# Patient Record
Sex: Male | Born: 1960 | Race: White | Hispanic: No | Marital: Married | Smoking: Never smoker
Health system: Southern US, Community
[De-identification: ages and names within clinical notes are randomized; demographics above are authoritative.]

## PROBLEM LIST (undated history)

## (undated) DIAGNOSIS — I639 Cerebral infarction, unspecified: Secondary | ICD-10-CM

## (undated) HISTORY — DX: Cerebral infarction, unspecified: I63.9

---

## 2004-09-01 ENCOUNTER — Emergency Department (HOSPITAL_COMMUNITY): Admission: EM | Admit: 2004-09-01 | Discharge: 2004-09-01 | Payer: Self-pay | Admitting: Emergency Medicine

## 2004-09-01 IMAGING — CR DG LUMBAR SPINE COMPLETE 4+V
5 series · 5 of 5 positions shown · non-contrast
Comparison: none

CLINICAL DATA: Bilateral lower extremity pain. No injury.  
 LUMBAR SPINE COMPLETE ? [DATE] ([SP] HOURS)

[view not recorded (1 of 5)]
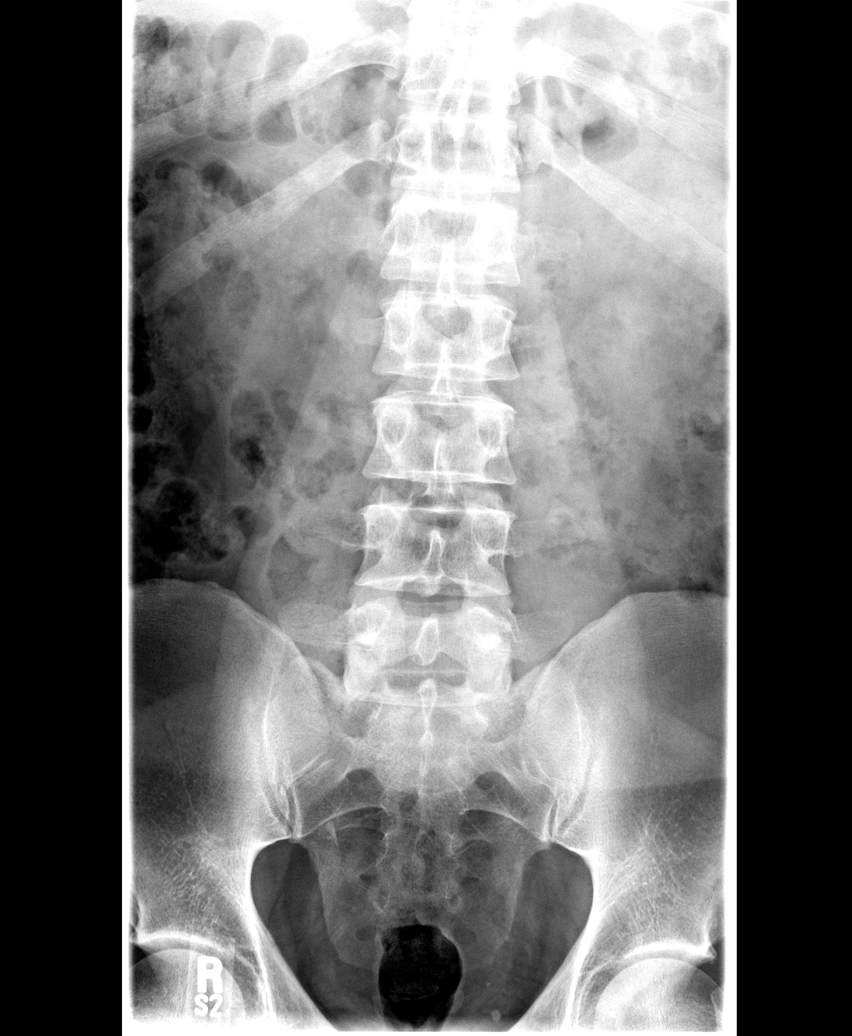

[view not recorded (2 of 5)]
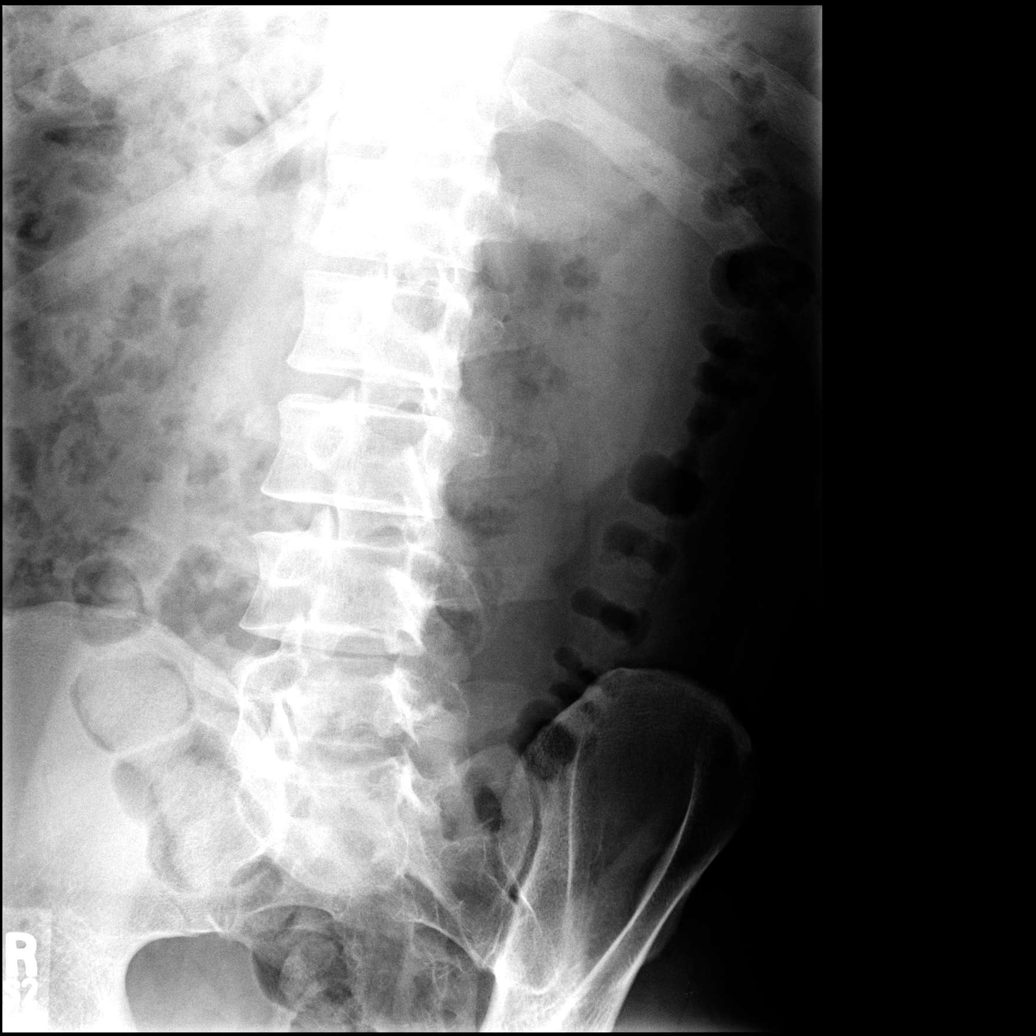

[view not recorded (3 of 5)]
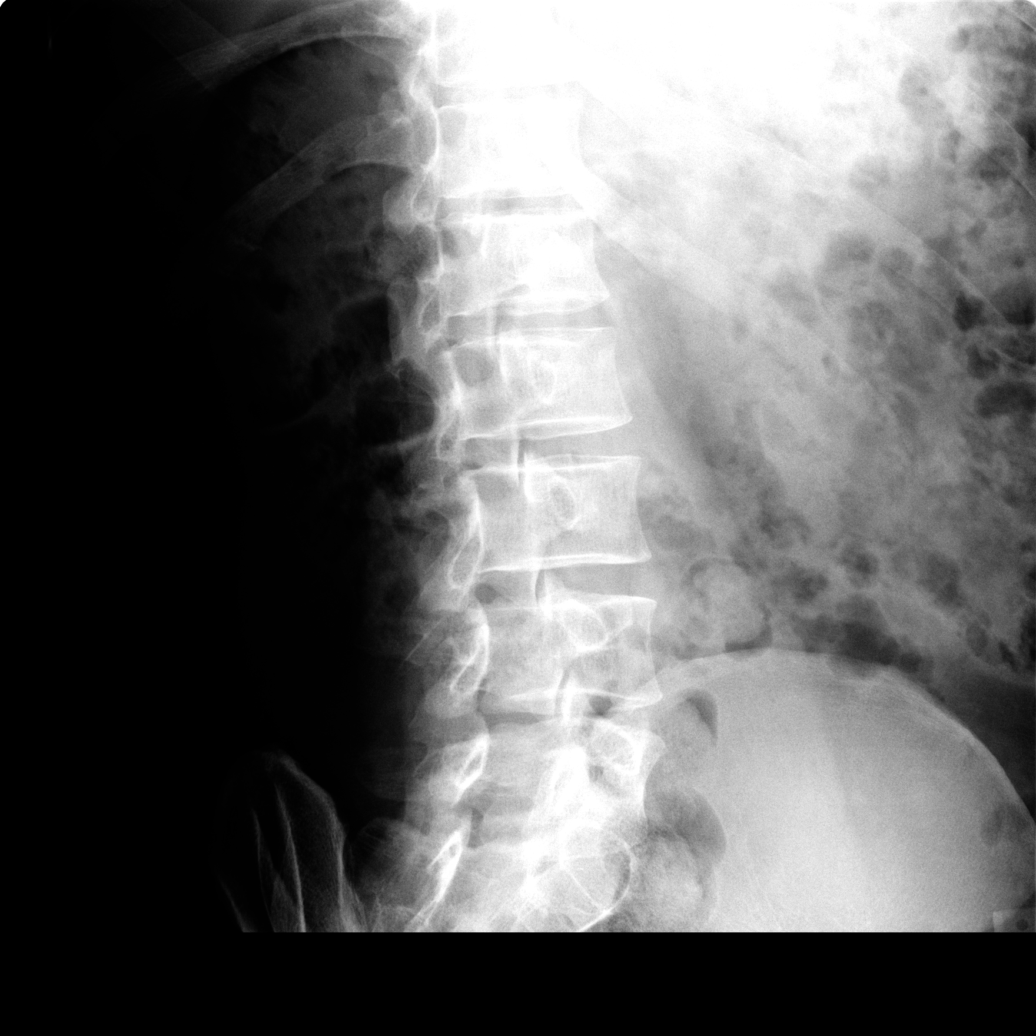

[view not recorded (4 of 5)]
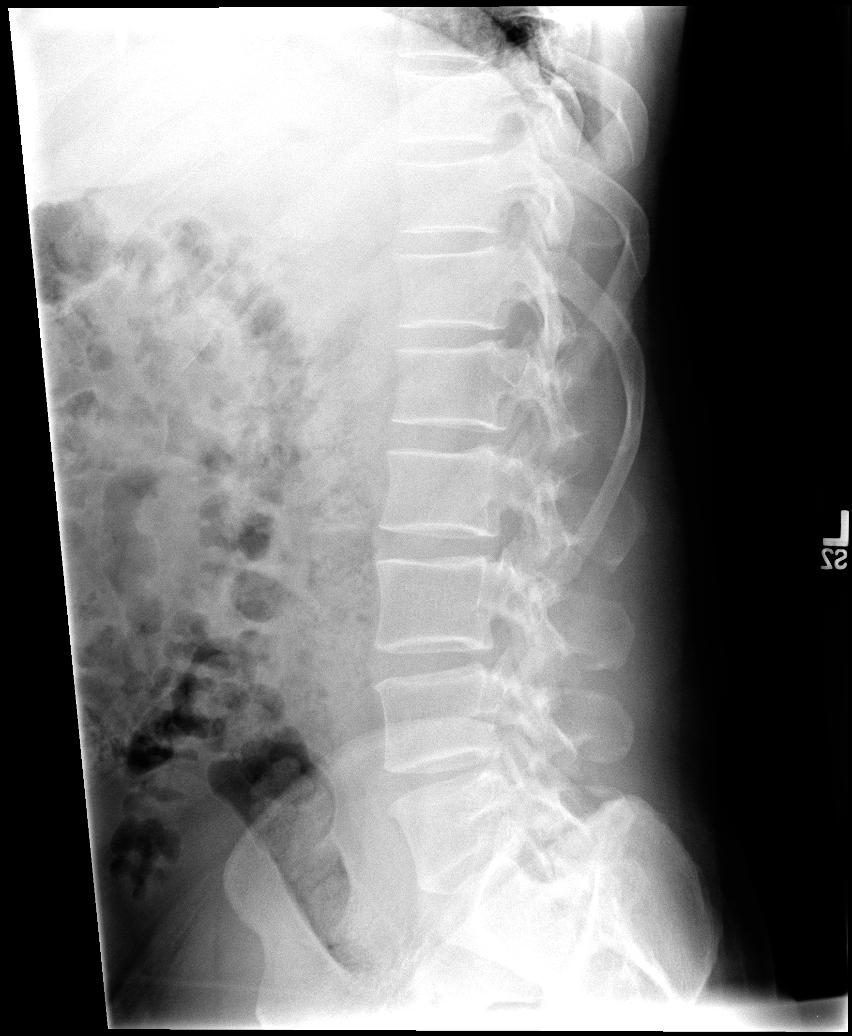

[view not recorded (5 of 5)]
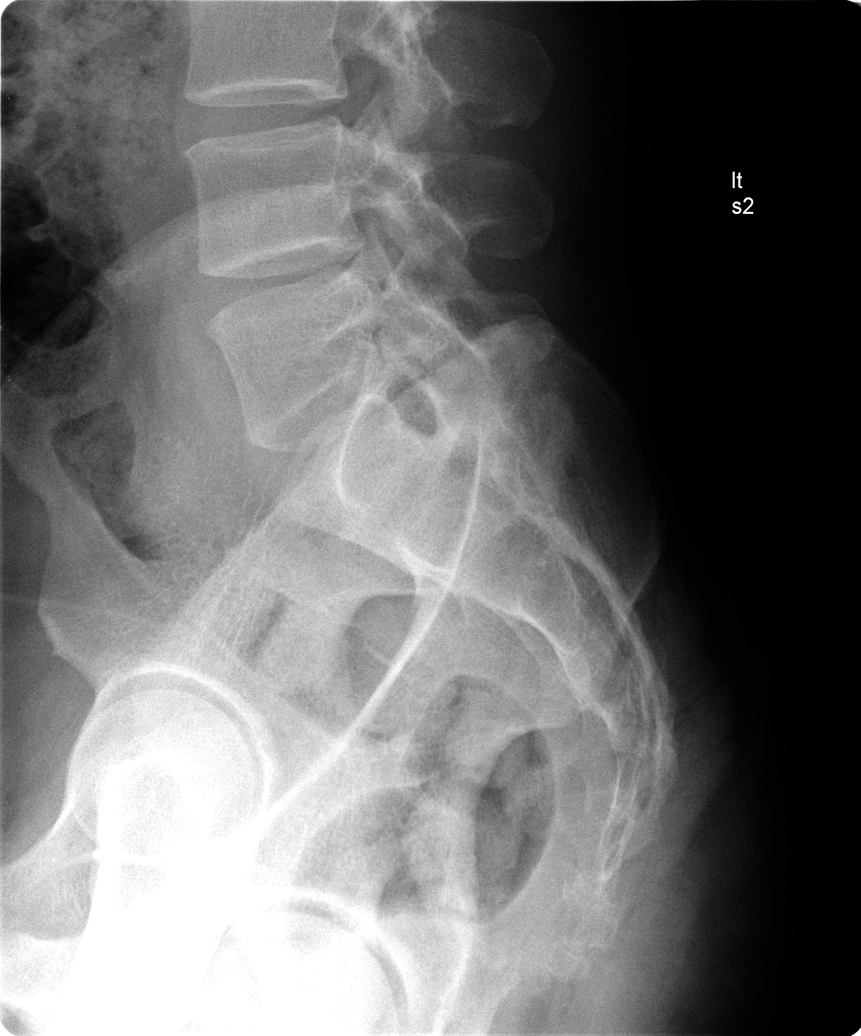

[5 of 5 positions shown; findings below may reference images not displayed]

FINDINGS: There is anatomic alignment of the vertebral bodies.  There is no vertebral body height loss.  Mild narrowing at the L4-5 disc is present.   No pars defects are seen.   No definite fractures are seen.
IMPRESSION: No evidence of acute bony injury.  Mild degenerative change at L4-5.

## 2022-02-11 ENCOUNTER — Inpatient Hospital Stay (HOSPITAL_COMMUNITY): Payer: 59

## 2022-02-11 ENCOUNTER — Inpatient Hospital Stay (HOSPITAL_COMMUNITY): Payer: 59 | Admitting: Anesthesiology

## 2022-02-11 ENCOUNTER — Other Ambulatory Visit: Payer: Self-pay

## 2022-02-11 ENCOUNTER — Emergency Department (HOSPITAL_COMMUNITY): Payer: 59 | Admitting: Certified Registered"

## 2022-02-11 ENCOUNTER — Emergency Department (HOSPITAL_COMMUNITY): Payer: 59

## 2022-02-11 ENCOUNTER — Encounter (HOSPITAL_COMMUNITY)
Admission: EM | Disposition: A | Discharge status: Rehabilitation Facility | Payer: Self-pay | Source: Home / Self Care | Attending: Neurology

## 2022-02-11 ENCOUNTER — Inpatient Hospital Stay (HOSPITAL_COMMUNITY)
Admission: EM | Admit: 2022-02-11 | Discharge: 2022-02-18 | DRG: 023 | Disposition: A | Discharge status: Rehabilitation Facility | Payer: 59 | Attending: Neurology | Admitting: Neurology

## 2022-02-11 ENCOUNTER — Other Ambulatory Visit (HOSPITAL_COMMUNITY): Payer: 59

## 2022-02-11 ENCOUNTER — Encounter (HOSPITAL_COMMUNITY): Payer: Self-pay

## 2022-02-11 DIAGNOSIS — R4701 Aphasia: Secondary | ICD-10-CM | POA: Diagnosis present

## 2022-02-11 DIAGNOSIS — T82856A Stenosis of peripheral vascular stent, initial encounter: Secondary | ICD-10-CM | POA: Diagnosis not present

## 2022-02-11 DIAGNOSIS — N179 Acute kidney failure, unspecified: Secondary | ICD-10-CM | POA: Diagnosis not present

## 2022-02-11 DIAGNOSIS — R0689 Other abnormalities of breathing: Secondary | ICD-10-CM | POA: Diagnosis not present

## 2022-02-11 DIAGNOSIS — I69328 Other speech and language deficits following cerebral infarction: Secondary | ICD-10-CM | POA: Diagnosis not present

## 2022-02-11 DIAGNOSIS — Z6838 Body mass index (BMI) 38.0-38.9, adult: Secondary | ICD-10-CM | POA: Diagnosis not present

## 2022-02-11 DIAGNOSIS — R1312 Dysphagia, oropharyngeal phase: Secondary | ICD-10-CM | POA: Diagnosis not present

## 2022-02-11 DIAGNOSIS — Y828 Other medical devices associated with adverse incidents: Secondary | ICD-10-CM | POA: Diagnosis not present

## 2022-02-11 DIAGNOSIS — I69320 Aphasia following cerebral infarction: Secondary | ICD-10-CM | POA: Diagnosis not present

## 2022-02-11 DIAGNOSIS — I493 Ventricular premature depolarization: Secondary | ICD-10-CM

## 2022-02-11 DIAGNOSIS — G8191 Hemiplegia, unspecified affecting right dominant side: Secondary | ICD-10-CM | POA: Diagnosis present

## 2022-02-11 DIAGNOSIS — I69391 Dysphagia following cerebral infarction: Secondary | ICD-10-CM | POA: Diagnosis not present

## 2022-02-11 DIAGNOSIS — I63312 Cerebral infarction due to thrombosis of left middle cerebral artery: Secondary | ICD-10-CM | POA: Diagnosis present

## 2022-02-11 DIAGNOSIS — Z20822 Contact with and (suspected) exposure to covid-19: Secondary | ICD-10-CM | POA: Diagnosis present

## 2022-02-11 DIAGNOSIS — Y838 Other surgical procedures as the cause of abnormal reaction of the patient, or of later complication, without mention of misadventure at the time of the procedure: Secondary | ICD-10-CM | POA: Diagnosis not present

## 2022-02-11 DIAGNOSIS — E7800 Pure hypercholesterolemia, unspecified: Secondary | ICD-10-CM | POA: Diagnosis not present

## 2022-02-11 DIAGNOSIS — I63512 Cerebral infarction due to unspecified occlusion or stenosis of left middle cerebral artery: Secondary | ICD-10-CM | POA: Diagnosis present

## 2022-02-11 DIAGNOSIS — R29810 Facial weakness: Secondary | ICD-10-CM | POA: Diagnosis present

## 2022-02-11 DIAGNOSIS — E785 Hyperlipidemia, unspecified: Secondary | ICD-10-CM | POA: Diagnosis present

## 2022-02-11 DIAGNOSIS — R29710 NIHSS score 10: Secondary | ICD-10-CM | POA: Diagnosis not present

## 2022-02-11 DIAGNOSIS — I82401 Acute embolism and thrombosis of unspecified deep veins of right lower extremity: Secondary | ICD-10-CM | POA: Diagnosis not present

## 2022-02-11 DIAGNOSIS — E6609 Other obesity due to excess calories: Secondary | ICD-10-CM | POA: Diagnosis not present

## 2022-02-11 DIAGNOSIS — R29708 NIHSS score 8: Secondary | ICD-10-CM | POA: Diagnosis present

## 2022-02-11 DIAGNOSIS — I10 Essential (primary) hypertension: Secondary | ICD-10-CM | POA: Diagnosis present

## 2022-02-11 DIAGNOSIS — R1311 Dysphagia, oral phase: Secondary | ICD-10-CM | POA: Diagnosis not present

## 2022-02-11 DIAGNOSIS — I82451 Acute embolism and thrombosis of right peroneal vein: Secondary | ICD-10-CM | POA: Diagnosis not present

## 2022-02-11 DIAGNOSIS — I824Y9 Acute embolism and thrombosis of unspecified deep veins of unspecified proximal lower extremity: Secondary | ICD-10-CM | POA: Diagnosis not present

## 2022-02-11 DIAGNOSIS — I69351 Hemiplegia and hemiparesis following cerebral infarction affecting right dominant side: Secondary | ICD-10-CM | POA: Diagnosis not present

## 2022-02-11 DIAGNOSIS — E669 Obesity, unspecified: Secondary | ICD-10-CM | POA: Diagnosis present

## 2022-02-11 DIAGNOSIS — I6012 Nontraumatic subarachnoid hemorrhage from left middle cerebral artery: Secondary | ICD-10-CM | POA: Diagnosis not present

## 2022-02-11 DIAGNOSIS — T80818A Extravasation of other vesicant agent, initial encounter: Secondary | ICD-10-CM | POA: Diagnosis not present

## 2022-02-11 DIAGNOSIS — K219 Gastro-esophageal reflux disease without esophagitis: Secondary | ICD-10-CM | POA: Diagnosis present

## 2022-02-11 DIAGNOSIS — Z6835 Body mass index (BMI) 35.0-35.9, adult: Secondary | ICD-10-CM | POA: Diagnosis not present

## 2022-02-11 DIAGNOSIS — I639 Cerebral infarction, unspecified: Secondary | ICD-10-CM | POA: Diagnosis not present

## 2022-02-11 DIAGNOSIS — I69392 Facial weakness following cerebral infarction: Secondary | ICD-10-CM | POA: Diagnosis not present

## 2022-02-11 DIAGNOSIS — R7303 Prediabetes: Secondary | ICD-10-CM | POA: Diagnosis not present

## 2022-02-11 DIAGNOSIS — R471 Dysarthria and anarthria: Secondary | ICD-10-CM | POA: Diagnosis present

## 2022-02-11 DIAGNOSIS — I82461 Acute embolism and thrombosis of right calf muscular vein: Secondary | ICD-10-CM | POA: Diagnosis not present

## 2022-02-11 DIAGNOSIS — N390 Urinary tract infection, site not specified: Secondary | ICD-10-CM | POA: Diagnosis not present

## 2022-02-11 DIAGNOSIS — E871 Hypo-osmolality and hyponatremia: Secondary | ICD-10-CM | POA: Diagnosis not present

## 2022-02-11 HISTORY — PX: IR US GUIDE VASC ACCESS RIGHT: IMG2390

## 2022-02-11 HISTORY — PX: IR PERCUTANEOUS ART THROMBECTOMY/INFUSION INTRACRANIAL INC DIAG ANGIO: IMG6087

## 2022-02-11 HISTORY — PX: RADIOLOGY WITH ANESTHESIA: SHX6223

## 2022-02-11 HISTORY — PX: IR CT HEAD LTD: IMG2386

## 2022-02-11 LAB — CBC
HCT: 48.8 % (ref 39.0–52.0)
Hemoglobin: 16.4 g/dL (ref 13.0–17.0)
MCH: 30.8 pg (ref 26.0–34.0)
MCHC: 33.6 g/dL (ref 30.0–36.0)
MCV: 91.6 fL (ref 80.0–100.0)
Platelets: 181 10*3/uL (ref 150–400)
RBC: 5.33 MIL/uL (ref 4.22–5.81)
RDW: 12.4 % (ref 11.5–15.5)
WBC: 7.8 10*3/uL (ref 4.0–10.5)
nRBC: 0 % (ref 0.0–0.2)

## 2022-02-11 LAB — DIFFERENTIAL
Abs Immature Granulocytes: 0.03 10*3/uL (ref 0.00–0.07)
Basophils Absolute: 0.1 10*3/uL (ref 0.0–0.1)
Basophils Relative: 1 %
Eosinophils Absolute: 0.1 10*3/uL (ref 0.0–0.5)
Eosinophils Relative: 1 %
Immature Granulocytes: 0 %
Lymphocytes Relative: 51 %
Lymphs Abs: 3.9 10*3/uL (ref 0.7–4.0)
Monocytes Absolute: 1 10*3/uL (ref 0.1–1.0)
Monocytes Relative: 12 %
Neutro Abs: 2.8 10*3/uL (ref 1.7–7.7)
Neutrophils Relative %: 35 %

## 2022-02-11 LAB — COMPREHENSIVE METABOLIC PANEL
ALT: 27 U/L (ref 0–44)
AST: 23 U/L (ref 15–41)
Albumin: 4.1 g/dL (ref 3.5–5.0)
Alkaline Phosphatase: 68 U/L (ref 38–126)
Anion gap: 8 (ref 5–15)
BUN: 20 mg/dL (ref 6–20)
CO2: 21 mmol/L — ABNORMAL LOW (ref 22–32)
Calcium: 8.8 mg/dL — ABNORMAL LOW (ref 8.9–10.3)
Chloride: 107 mmol/L (ref 98–111)
Creatinine, Ser: 1.2 mg/dL (ref 0.61–1.24)
GFR, Estimated: >60 mL/min (ref >60)
Glucose, Bld: 161 mg/dL — ABNORMAL HIGH (ref 70–99)
Potassium: 3.8 mmol/L (ref 3.5–5.1)
Sodium: 136 mmol/L (ref 135–145)
Total Bilirubin: 0.7 mg/dL (ref 0.3–1.2)
Total Protein: 7.2 g/dL (ref 6.5–8.1)

## 2022-02-11 LAB — RAPID URINE DRUG SCREEN, HOSP PERFORMED
Amphetamines: NOT DETECTED
Barbiturates: NOT DETECTED
Benzodiazepines: NOT DETECTED
Cocaine: NOT DETECTED
Opiates: NOT DETECTED
Tetrahydrocannabinol: NOT DETECTED

## 2022-02-11 LAB — ETHANOL: Alcohol, Ethyl (B): <10 mg/dL (ref <10)

## 2022-02-11 LAB — POCT I-STAT 7, (LYTES, BLD GAS, ICA,H+H)
Acid-base deficit: 4 mmol/L — ABNORMAL HIGH (ref 0.0–2.0)
Bicarbonate: 21.1 mmol/L (ref 20.0–28.0)
Calcium, Ion: 1.11 mmol/L — ABNORMAL LOW (ref 1.15–1.40)
HCT: 42 % (ref 39.0–52.0)
Hemoglobin: 14.3 g/dL (ref 13.0–17.0)
O2 Saturation: 96 %
Patient temperature: 98
Potassium: 4.7 mmol/L (ref 3.5–5.1)
Sodium: 135 mmol/L (ref 135–145)
TCO2: 22 mmol/L (ref 22–32)
pCO2 arterial: 37.8 mmHg (ref 32–48)
pH, Arterial: 7.354 (ref 7.35–7.45)
pO2, Arterial: 85 mmHg (ref 83–108)

## 2022-02-11 LAB — URINALYSIS, ROUTINE W REFLEX MICROSCOPIC
Bilirubin Urine: NEGATIVE
Glucose, UA: NEGATIVE mg/dL
Hgb urine dipstick: NEGATIVE
Ketones, ur: 5 mg/dL — AB
Leukocytes,Ua: NEGATIVE
Nitrite: NEGATIVE
Protein, ur: NEGATIVE mg/dL
Specific Gravity, Urine: 1.033 — ABNORMAL HIGH (ref 1.005–1.030)
pH: 5 (ref 5.0–8.0)

## 2022-02-11 LAB — HEMOGLOBIN A1C
Hgb A1c MFr Bld: 6 % — ABNORMAL HIGH (ref 4.8–5.6)
Mean Plasma Glucose: 125.5 mg/dL

## 2022-02-11 LAB — MRSA NEXT GEN BY PCR, NASAL: MRSA by PCR Next Gen: NOT DETECTED

## 2022-02-11 LAB — PROTIME-INR
INR: 1 (ref 0.8–1.2)
Prothrombin Time: 12.5 seconds (ref 11.4–15.2)

## 2022-02-11 LAB — RESP PANEL BY RT-PCR (FLU A&B, COVID) ARPGX2
Influenza A by PCR: NEGATIVE
Influenza B by PCR: NEGATIVE
SARS Coronavirus 2 by RT PCR: NEGATIVE

## 2022-02-11 LAB — APTT: aPTT: 24 seconds (ref 24–36)

## 2022-02-11 LAB — CBG MONITORING, ED: Glucose-Capillary: 172 mg/dL — ABNORMAL HIGH (ref 70–99)

## 2022-02-11 LAB — HIV ANTIBODY (ROUTINE TESTING W REFLEX): HIV Screen 4th Generation wRfx: NONREACTIVE

## 2022-02-11 IMAGING — XA IR PERCUTANEOUS ART THORMBECTOMY/INFUSION INTRACRANIAL INCLUDE D
7 of 8 series · 12 of 24 positions shown · IV contrast (IODINE)
Comparison: CT/CT angiogram of the head and neck [DATE].

INDICATION: 60-year-old male with sudden onset right sided weakness, right-sided
facial droop and slurred speech. [facility] was [facility] evaluated [facility] admission NIHSS was 8; baseline modified [facility] 0.
Head CT showed small left frontal hypodensity (ASPECTS 9). CT
angiogram of the head and neck showed an occlusion of the left
M1/MCA. He received [facility] at [DATE] a.m. on [DATE] and was then
transferred to [HOSPITAL] for endovascular treatment. At admission
to [HOSPITAL], NIHSS was 10.

EXAM:
ULTRASOUND-GUIDED VASCULAR [REDACTED] CEREBRAL ANGIOGRAM
MECHANICAL THROMBECTOMY
FLAT PANEL HEAD CT
TECHNIQUE: Informed written consent was obtained from the patient's wife after
a thorough discussion of the procedural risks, benefits and
alternatives. All questions were addressed.

[Series 1: cerebral care 2 · 2 acquisitions, 1 frame shown (1 of 5)]
[im 1/2]
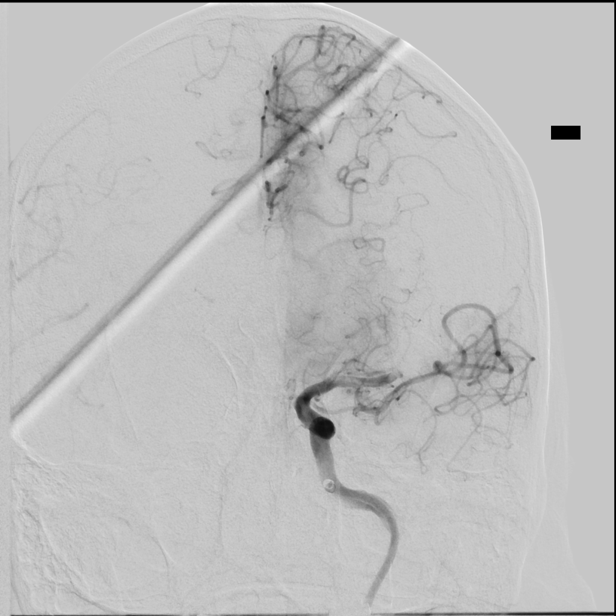

[Series 3: n roadmap · 2 acquisitions, 1 frame shown]
[im 1/2]
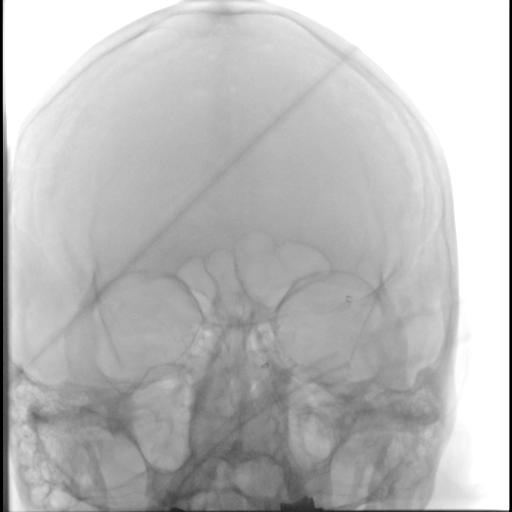

[Series 4: cerebral care 2 · 2 acquisitions, 1 frame shown (2 of 5)]
[im 1/2]
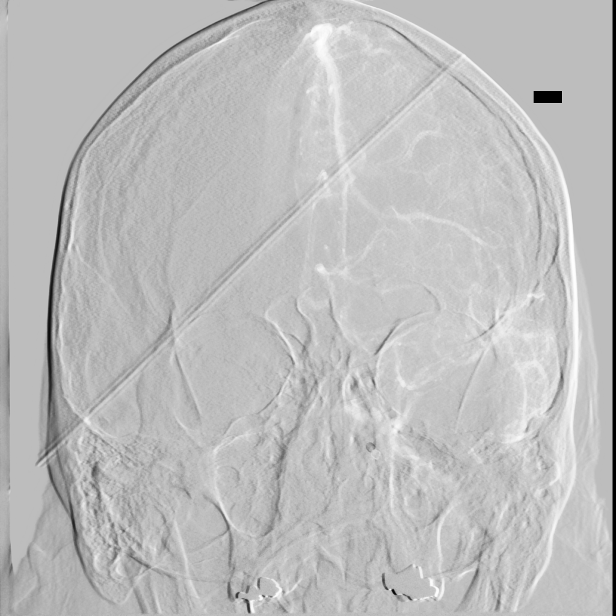

[Series 6: cerebral care 2 · 2 acquisitions, 1 frame shown (3 of 5)]
[im 1/2]
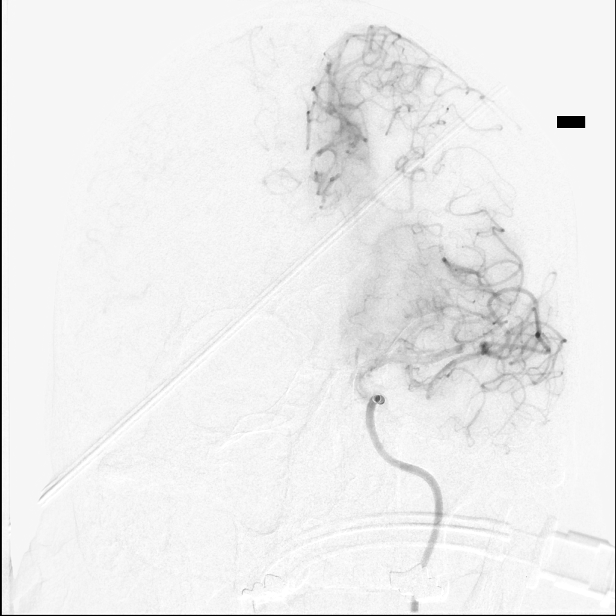

[Series 8: cerebral care 2 · 2 acquisitions, 2 frames shown (4 of 5)]
[im 1/2]
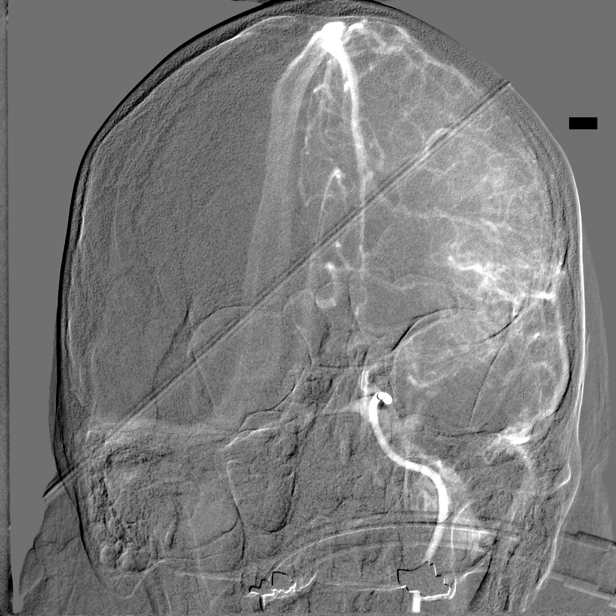
[im 1/2  full-range]
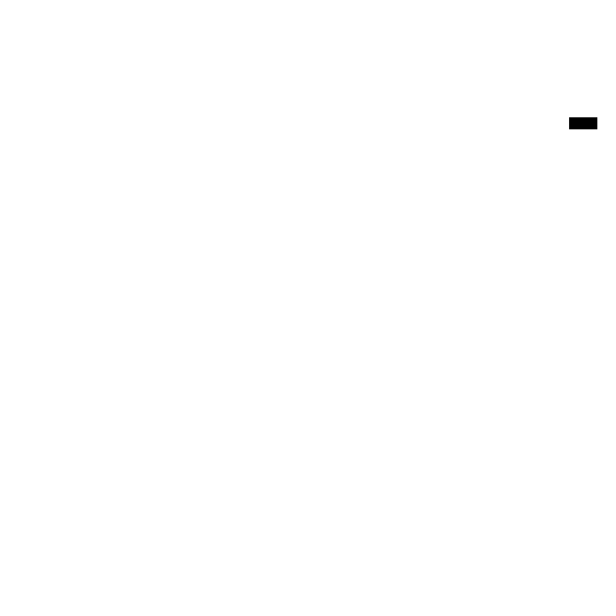

[Series 11: cerebral care 2 · 1 of 10 frames shown (5 of 5)]
[frame 6/10]
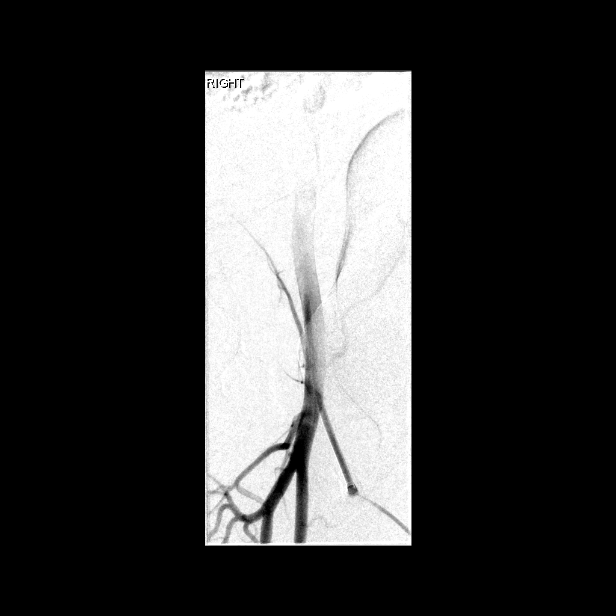

[Series 300: dr. (person_name). · 5 of 26 slices shown]
[im 4/26]
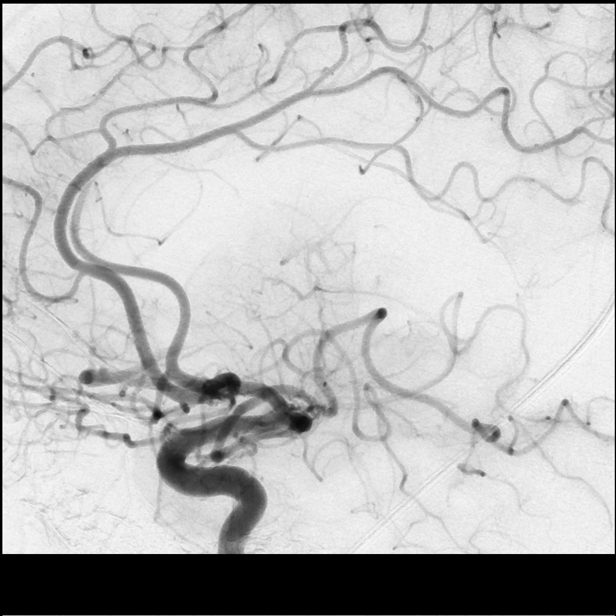
[im 9/26]
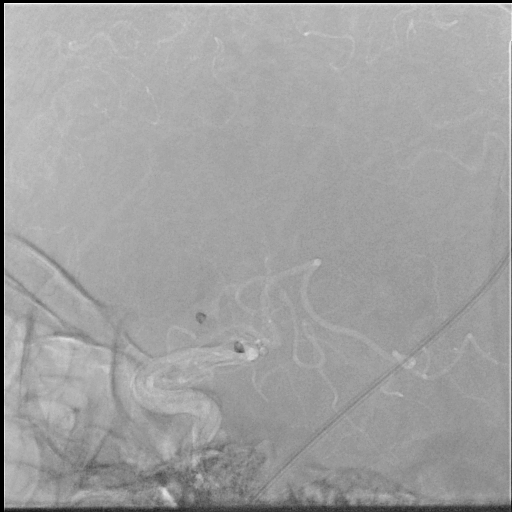
[im 14/26]
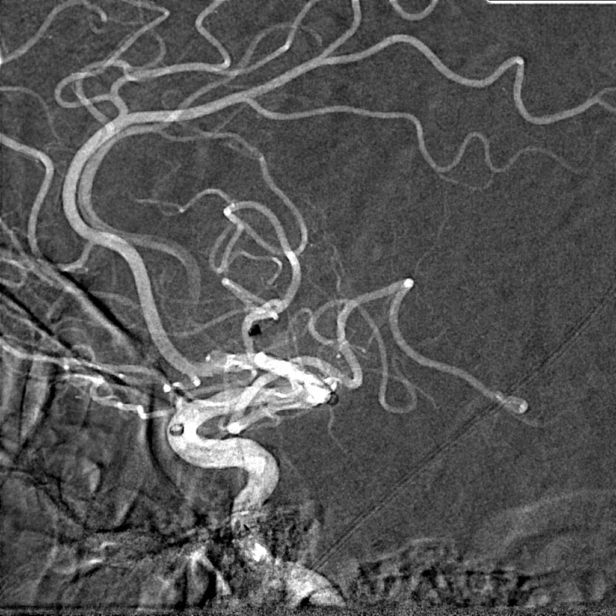
[im 21/26]
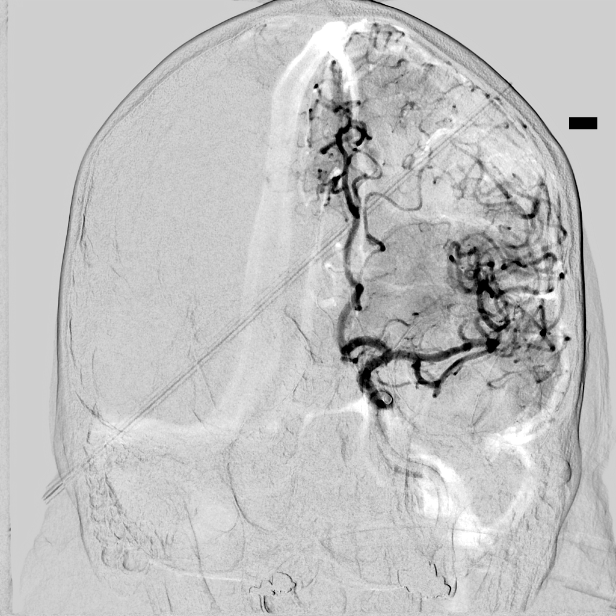
[im 26/26]
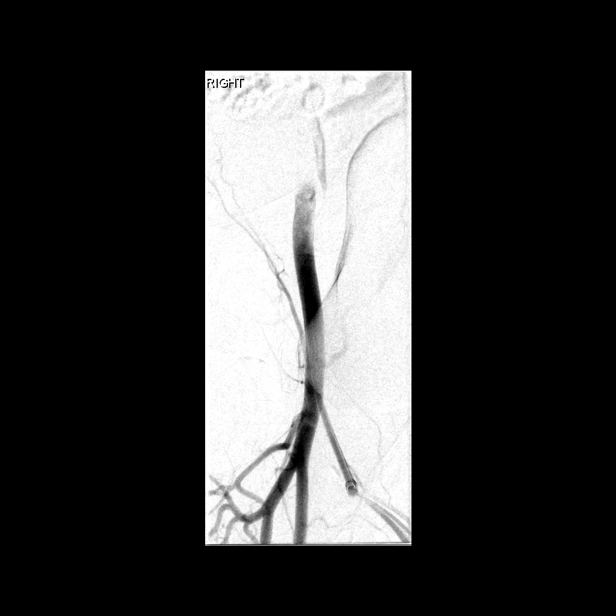

[12 of 24 positions shown; findings below may reference images not displayed]

MEDICATIONS:
No antibiotics administered.

ANESTHESIA/SEDATION:
The procedure was performed under general anesthesia.

CONTRAST:  75 mL Omnipaque 300 milligram/mL

FLUOROSCOPY:
Radiation Exposure Index (as provided by the fluoroscopic device):
PSD 444 mGy Kerma

COMPLICATIONS:
None immediate.
Maximal Sterile Barrier Technique was utilized including caps, mask,
sterile gowns, sterile gloves, sterile drape, hand hygiene and skin
antiseptic. A timeout was performed prior to the initiation of the
procedure.

The right groin was prepped and draped in the usual sterile fashion.
Using a micropuncture kit and the modified Seldinger technique,
access was gained to the right common femoral artery and an 8 French
sheath was placed. Real-time ultrasound guidance was utilized for
vascular access including the acquisition of a permanent ultrasound
image documenting patency of the accessed vessel.

Under fluoroscopy, a Zoom 88 guide catheter was navigated over a 6
[facility] 2 catheter and a 0.035" Terumo Glidewire into the
aortic arch. The catheter was placed into the left common carotid
artery and then advanced into the left internal carotid artery. The
diagnostic catheter was removed. Frontal and lateral angiograms of
the head were obtained.
FINDINGS: 1. The right common femoral artery has normal caliber, adequate for
vascular access.
2. Occlusion of the distal left M1/MCA.
3. Patent left ACA vascular tree.

PROCEDURE:
Using biplane roadmap, a zoom 71 aspiration catheter was navigated
over an Aristotle 24 microguidewire into the cavernous segment of
the left ICA. The aspiration catheter was then advanced to the level
of occlusion in the M1 segment and connected to an aspiration pump.
Continuous aspiration was performed for 2 minutes. The guide
catheter was connected to a VacLok syringe. The aspiration catheter
was subsequently removed under constant aspiration. The guide
catheter was aspirated for debris.

Left internal carotid artery angiograms with magnified frontal and
lateral views of the head showed recanalization of the M1 segment
with patent superior division branch. Occlusion of the posterior
division branches noted.

Using biplane roadmap, a zoom 71 aspiration catheter was navigated
over an Aristotle 24 microguidewire into the cavernous segment of
the left ICA. The aspiration catheter was then advanced to the level
of occlusion in the M2 segment and connected to an aspiration pump.
Continuous aspiration was performed for 2 minutes. The guide
catheter was connected to a VacLok syringe. The aspiration catheter
was subsequently removed under constant aspiration. The guide
catheter was aspirated for debris.

Left internal carotid artery angiograms with magnified frontal and
lateral views of the head showed persistent occlusion of the left M2
posterior division branch.

Using biplane roadmap, a Zoom 55 aspiration catheter was navigated
over an Aristotle 24 microguidewire into the cavernous segment of
the left ICA. The aspiration catheter was then advanced to the level
of occlusion in the M2 posterior division branch and connected to an
aspiration pump. Continuous aspiration was performed for 2 minutes.
The guide catheter was connected to a VacLok syringe. The aspiration
catheter was subsequently removed under constant aspiration. The
guide catheter was aspirated for debris.

Left internal carotid artery angiograms with frontal and lateral
views of the head showed complete recanalization of the left MCA
vascular tree. Mild irregularity of the M2/MCA posterior division
branch likely related to mild vasospasm.

Flat panel CT of the head was obtained and post processed in a
separate workstation with concurrent attending physician
supervision. Selected images were sent to PACS. Hyperdensity of the
cortices in the left hemisphere as well as right basal ganglia most
likely represent contrast staining.

The catheter was subsequently withdrawn.

Right common femoral artery angiogram was obtained in right anterior
oblique view. The puncture is at the level of the common femoral
artery. The artery has normal caliber, adequate for closure device.
The sheath was exchanged over the wire for a Perclose prostyle which
was utilized for access closure. Immediate hemostasis was achieved.
IMPRESSION: 1. Successful mechanical thrombectomy for treatment of a distal left
M1/MCA occlusion with direct contact aspiration ([QJ]).
2. Left-sided cortical and basal ganglia contrast staining on
postprocedural flat panel head CT.

PLAN:
1. Transferred to ICU.
2. SBP 120-140 mm Hg for 24 hours.
3. Bed rest x6 hours.

## 2022-02-11 IMAGING — CT CT ANGIO HEAD-NECK (W OR W/O PERF)
2 of 8 series · 8 of 33 positions shown · non-contrast
Comparison: Plain head CT [GT] hours.

CLINICAL DATA: Code stroke. 60-year-old male with sudden onset
right side weakness.

EXAM:
CT ANGIOGRAPHY HEAD AND NECK
TECHNIQUE: Multidetector CT imaging of the head and neck was performed using
the standard protocol during bolus administration of intravenous
contrast. Multiplanar CT image reconstructions and MIPs were
obtained to evaluate the vascular anatomy. Carotid stenosis
measurements (when applicable) are obtained utilizing NASCET
criteria, using the distal internal carotid diameter as the
denominator.

[Series 5: cta head & neck · axial · 0.39mm/px · z∈[-168,+94]mm · 6 of 741 slices shown]
[im 106/741  soft-tissue]
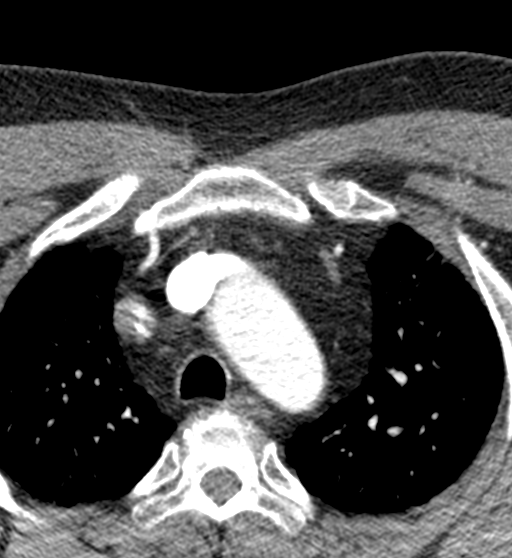
[im 212/741  bone]
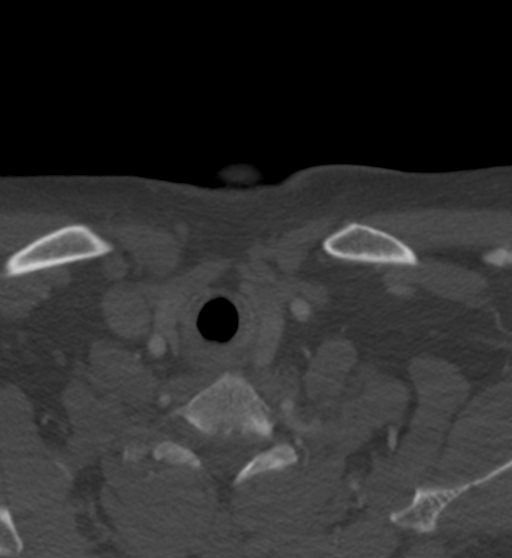
[im 318/741  soft-tissue]
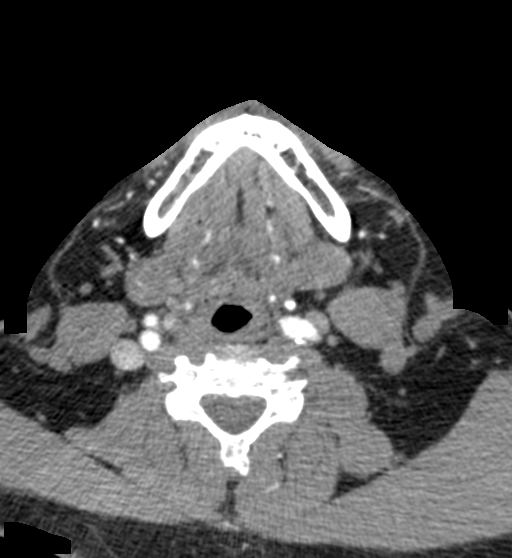
[im 423/741  bone]
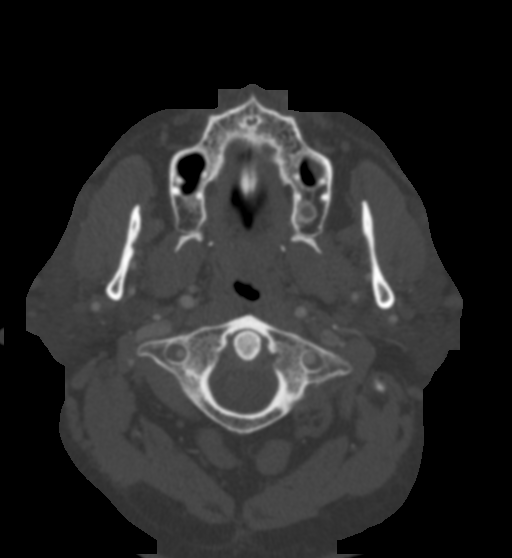
[im 529/741  soft-tissue]
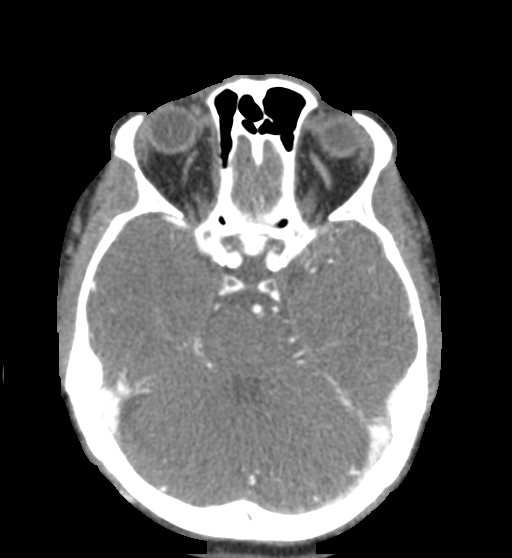
[im 635/741  bone]
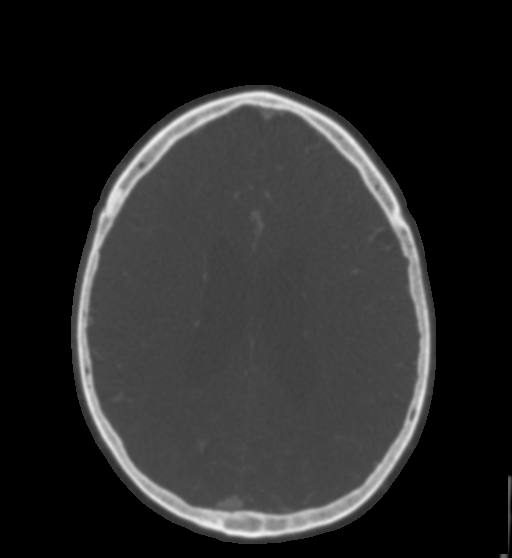

[Series 6: ax thins · axial · 0.39mm/px · z∈[-98,+24]mm · 2 of 371 slices shown]
[im 124/371  soft-tissue]
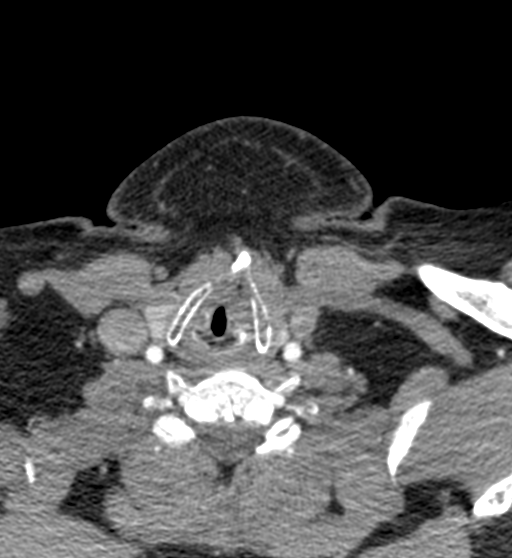
[im 247/371  soft-tissue]
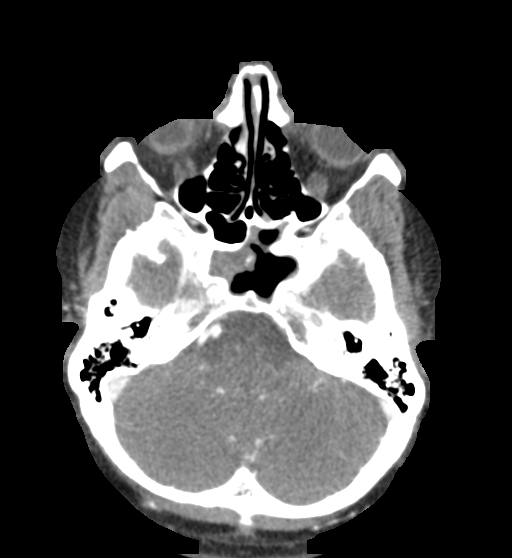

[8 of 33 positions shown; findings below may reference images not displayed]

RADIATION DOSE REDUCTION: This exam was performed according to the
departmental dose-optimization program which includes automated
exposure control, adjustment of the mA and/or kV according to
patient size and/or use of iterative reconstruction technique.

CONTRAST:  75mL OMNIPAQUE IOHEXOL 350 MG/ML SOLN
FINDINGS: CTA NECK

Skeleton: No acute osseous abnormality identified. Ordinary cervical
spine degeneration.

Upper chest: Atelectasis and mediastinal lipomatosis.

Other neck: Small volume retained secretions in the pharynx.
Otherwise negative.

Aortic arch: 3 vessel arch configuration. Minimal arch
atherosclerosis.

Right carotid system: Tortuous brachiocephalic artery without plaque
or stenosis. Minor soft and calcified plaque at the right carotid
bifurcation without stenosis.

Left carotid system: Mild tortuosity and carotid bifurcation plaque
with no stenosis.

Vertebral arteries:
Negative.  Mildly dominant left vertebral artery.

CTA HEAD

Posterior circulation: Minimal right V4 calcified plaque without
stenosis. Dominant left V4 segment. Normal PICA origins and
vertebrobasilar junction. Patent basilar artery without stenosis.
Patent SCA and PCA origins. Posterior communicating arteries are
diminutive or absent. PCA branches are within normal limits.

Anterior circulation: Both ICA siphons are patent. Mild to moderate
calcified plaque primarily in the supraclinoid segments. Mild
supraclinoid stenosis results greater on the left. Patent carotid
termini, MCA and ACA origins. Anterior communicating artery and ACA
branches are within normal limits. Right MCA M1 segment and
bifurcation are patent without stenosis. Right MCA branches are
within normal limits.

Left MCA origin is patent but the left M1 is occluded about 12 mm
distal to the origin (series 7, image 97). The left anterior
temporal artery origin is patent proximal to the occlusion and there
is good reconstitution of left MCA branches

Venous sinuses: Patent.

Anatomic variants: Dominant left vertebral artery.

Review of the MIP images confirms the above findings
IMPRESSION: 1. Positive for Emergent Large Vessel Occlusion: Left MCA M1.
This was discussed by telephone with Dr. TIGER on [DATE] at
[GT] hours.
Anterior temporal artery remains patent and there is robust MCA
branch collateral enhancement.

2. Generally mild for age atherosclerosis, most pronounced in the
supraclinoid ICA siphons with calcified plaque. No other significant
arterial stenosis.

## 2022-02-11 IMAGING — CT CT HEAD W/O CM
3 series · 14 of 47 positions shown, 16 images · non-contrast
Comparison: Noncontrast CT head and intraoperative CT head obtained
earlier the same day.

CLINICAL DATA: Stroke status [facility]



[Series 2: head 5.0 h30s · axial · 0.47mm/px · z∈[-113,+32]mm · 8 of 35 slices shown, 10 images]
[im 3/35  brain]
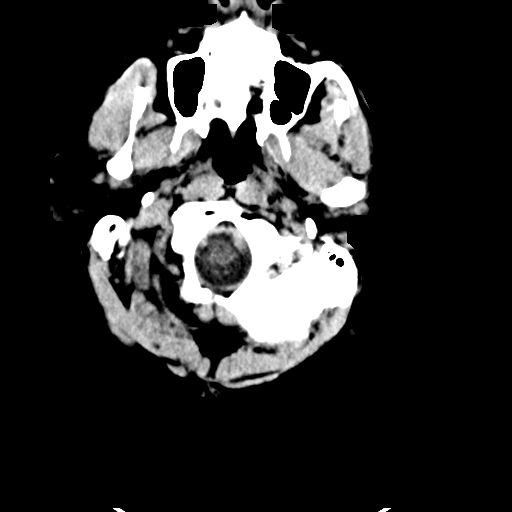
[im 3/35  bone]
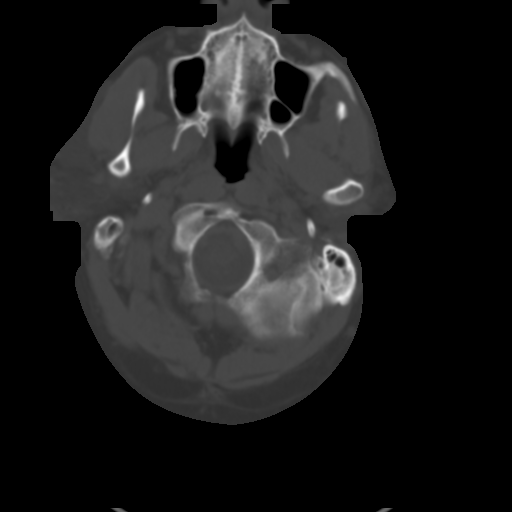
[im 8/35  brain]
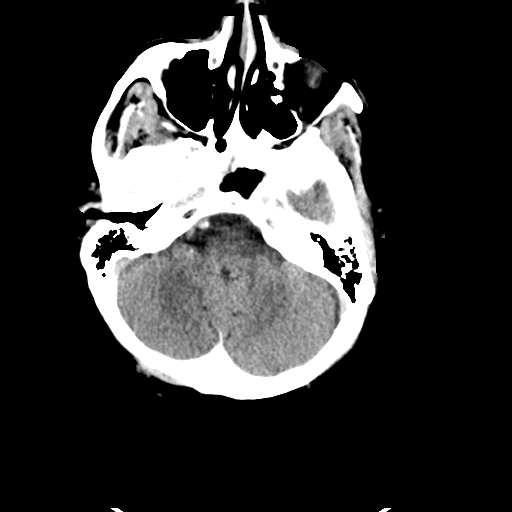
[im 11/35  brain]
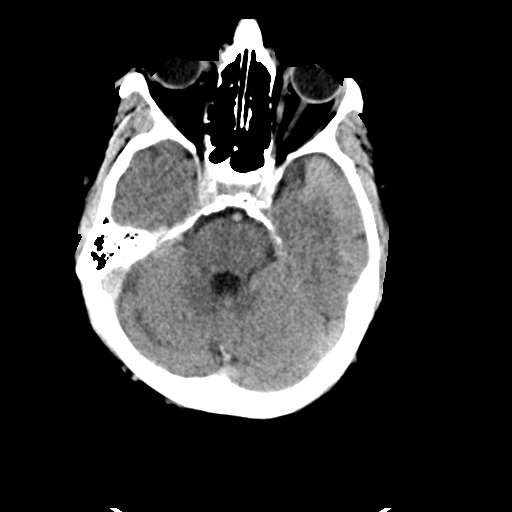
[im 16/35  brain]
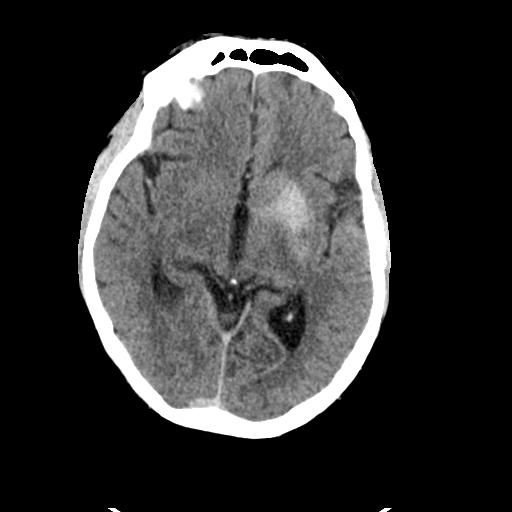
[im 19/35  brain]
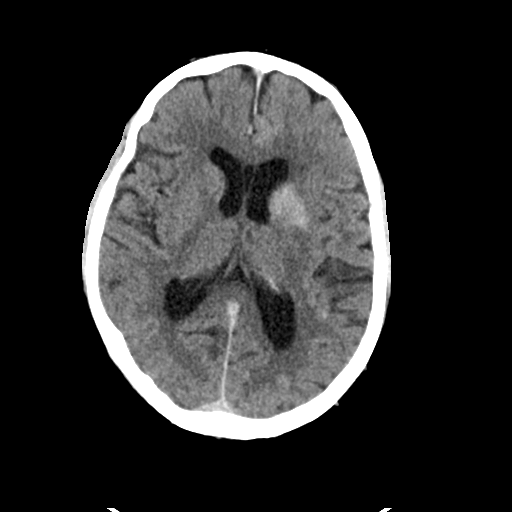
[im 19/35  bone]
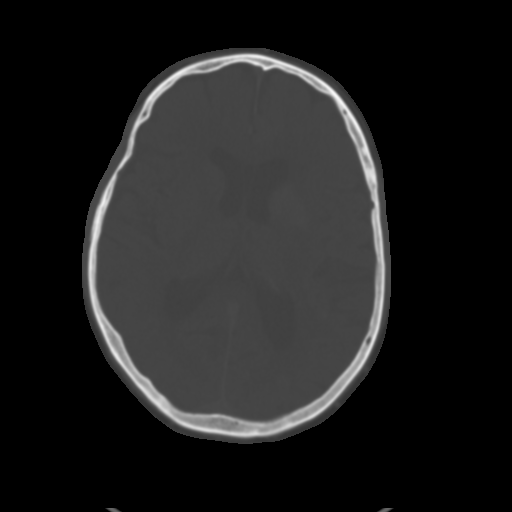
[im 24/35  brain]
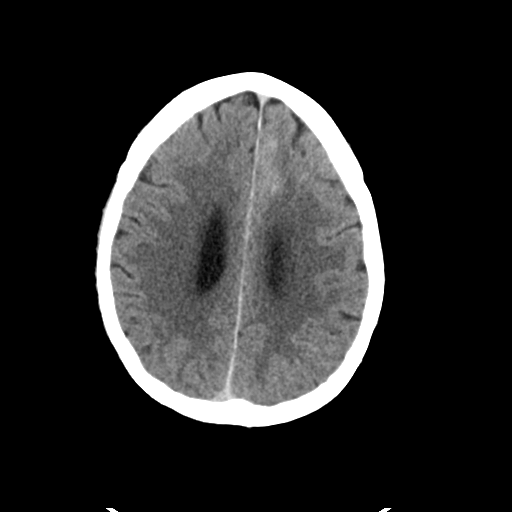
[im 27/35  brain]
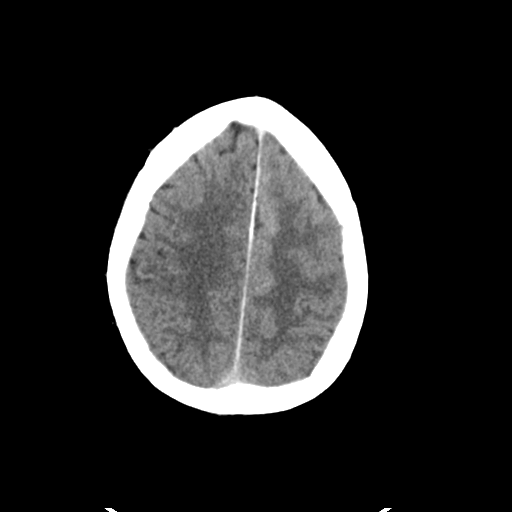
[im 32/35  brain]
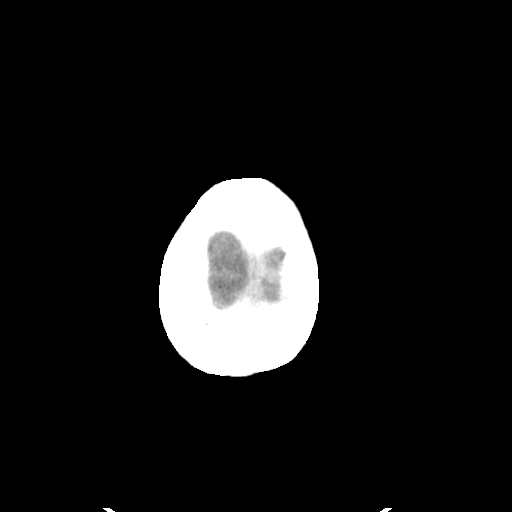

[Series 4: head 3.0 mpr cor · coronal · 0.37mm/px · 3 of 76 slices shown]
[im 26/76  brain]
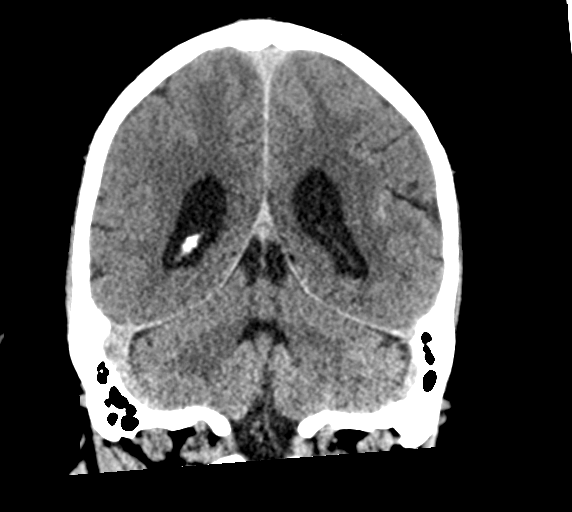
[im 34/76  brain]
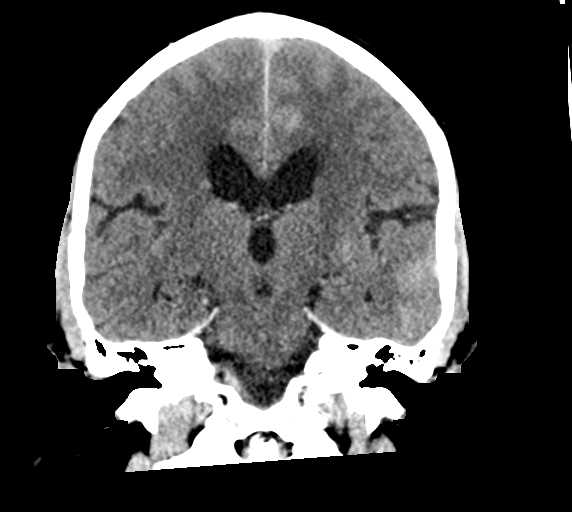
[im 42/76  brain]
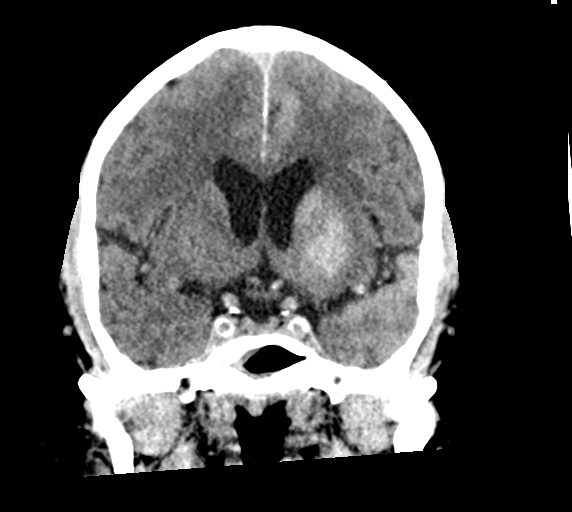

[Series 5: head 3.0 mpr sag · sagittal · 0.41mm/px · 3 of 66 slices shown]
[im 23/66  brain]
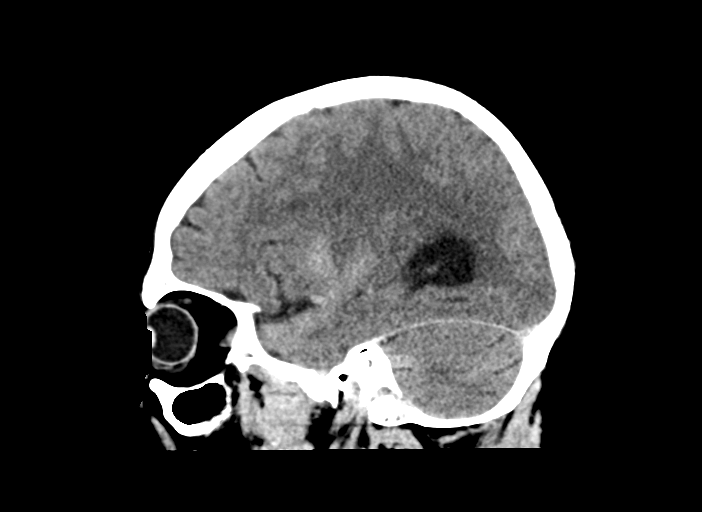
[im 33/66  brain]
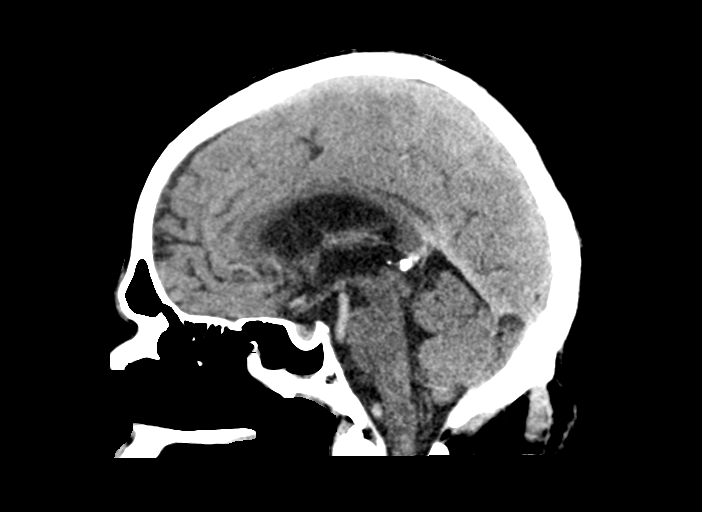
[im 42/66  brain]
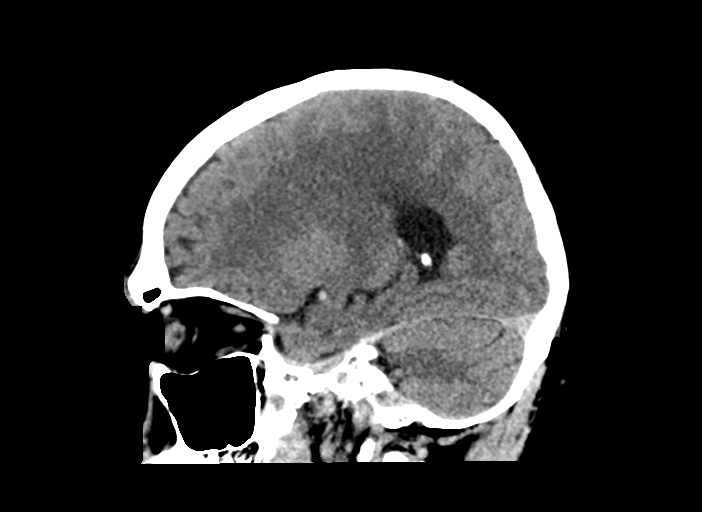

[14 of 47 positions shown; findings below may reference images not displayed]

FINDINGS: Brain: There is hyperdensity in the left caudate head, lentiform
nucleus, and frontal and temporal lobe cortex favored to reflect
contrast staining, though a component of blood products can not be
excluded. The appearance is similar to the intraoperative CT scan.
There is probable small volume subarachnoid hemorrhage overlying the
left cerebral hemisphere (5-48).

Gray-white differentiation in the left MCA distribution appears
preserved, without definite evidence of evolved MCA infarct.

The ventricles are stable in size. There is no mass lesion. There is
no mass effect or midline shift.

Vascular: There is calcified atherosclerotic plaque in the
intracranial ICAs. Previously seen dense left MCA is no longer
apparent.

Skull: Normal. Negative for fracture or focal lesion.

Sinuses/Orbits: There is mucosal thickening in the left sphenoid
sinus with sequela of chronic sinusitis. Globes and orbits are
unremarkable.

Other: None.
IMPRESSION: 1. Hyperdensity in the left caudate head, lentiform nucleus, and
frontal and temporal lobe cortex favored to primarily reflect
contrast staining, though a component of intraparenchymal hemorrhage
can not be excluded. The appearance is similar to the intraoperative
CT obtained earlier the same day
2. Small volume subarachnoid hemorrhage overlying the left cerebral
hemisphere.
3. Gray-white differentiation throughout the left MCA distribution
appears preserved, with no definite evidence of evolved infarct.
4. No mass effect or midline shift.

## 2022-02-11 IMAGING — DX DG CHEST 1V PORT
1 series · 1 of 1 positions shown · non-contrast
Comparison: None.

CLINICAL DATA: Intubated.

EXAM:
PORTABLE CHEST 1 VIEW

[chest ap]
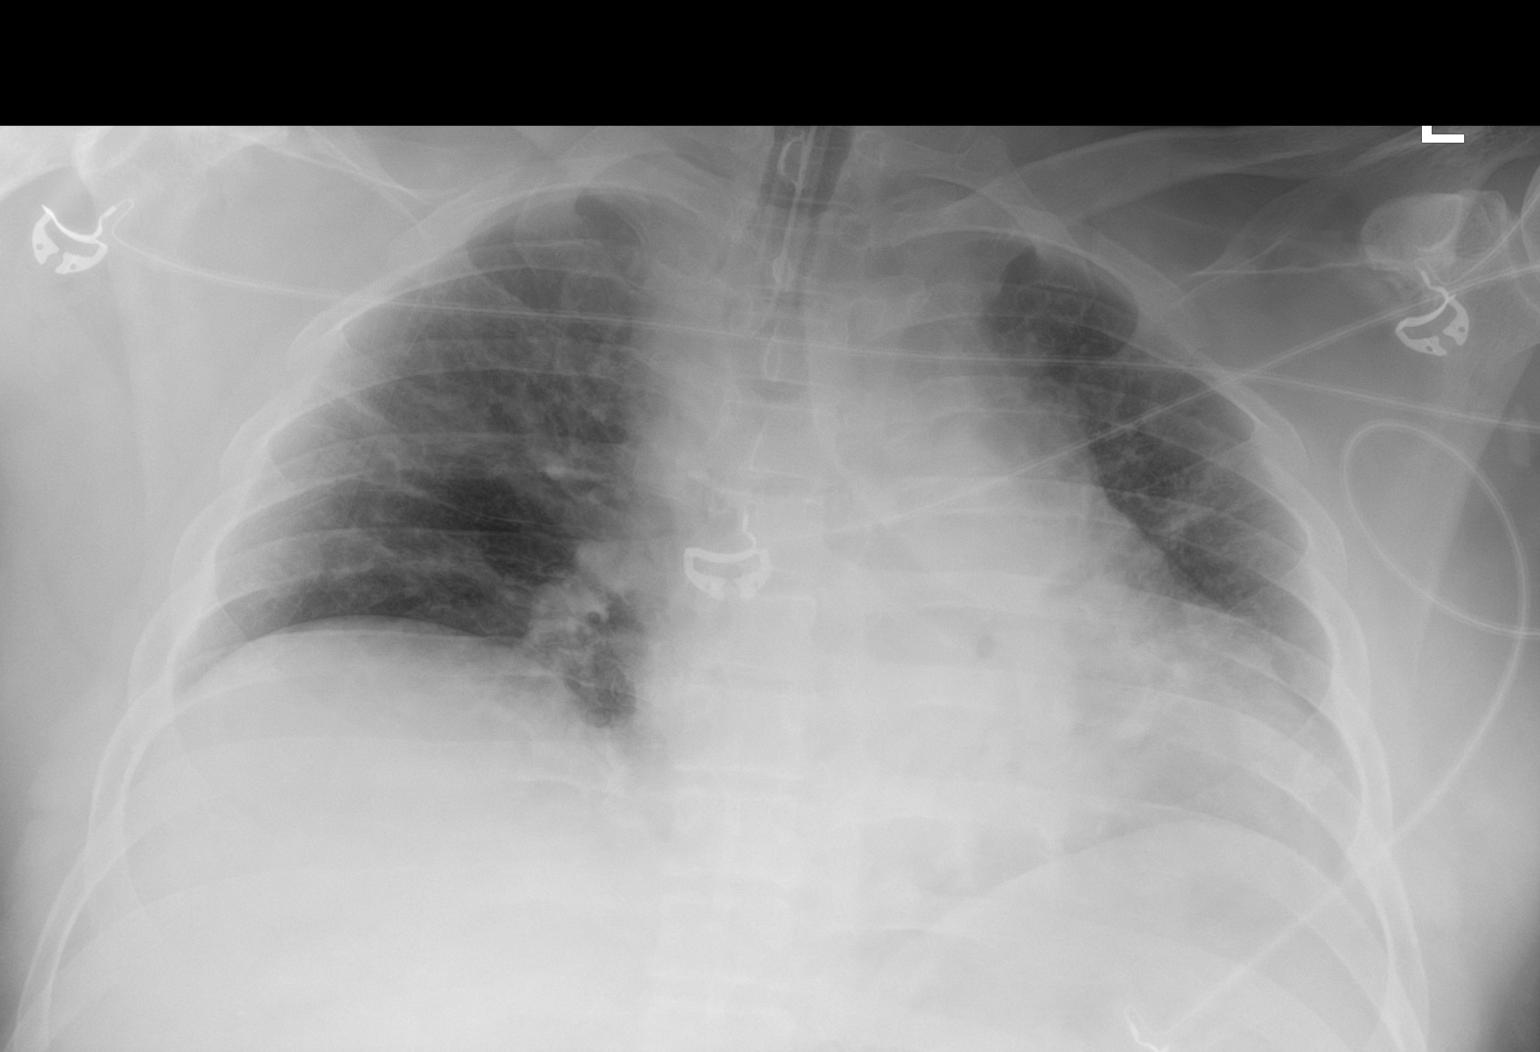

[1 of 1 positions shown; findings below may reference images not displayed]

FINDINGS: Endotracheal tube tip is 4.7 cm above the carina. Cardiomediastinal
silhouette appears enlarged. Lung volumes are low. There is some
patchy opacities in the bilateral mid lungs, left greater than
right. Costophrenic angles are clear. No pneumothorax or acute
fracture.
IMPRESSION: 1. Endotracheal tube tip 4.7 cm above carina.
2. Cardiomegaly.
3. Bilateral mid lung airspace disease, left greater than right.

## 2022-02-11 IMAGING — XA IR PERCUTANEOUS ART THORMBECTOMY/INFUSION INTRACRANIAL INCLUDE D
11 of 22 series · 11 of 24 positions shown · non-contrast
Comparison: Cerebral angiogram [DATE]

INDICATION: 60-year-old male with sudden onset right sided weakness, right-sided
facial droop and slurred speech. [facility] admission NIHSS was 8; baseline modified [facility] 0.
head CT showed small left frontal hypodensity (ASPECTS 9). CT
angiogram of the head and neck showed an occlusion of the left
M1/MCA. He received [facility] at [DATE] a.m. on [DATE] and was then
transferred to [HOSPITAL] for endovascular treatment. At admission
to [HOSPITAL], NIHSS was 10. He underwent mechanical thrombectomy
with successful revascularization and showed significant improvement
of the right-sided weakness with mild persistent expressive aphasia.
However, around 5 p.m. on [DATE] he presented sudden decline
with relapsed right-sided weakness and aphasia. He comes to our
service again today for diagnostic cerebral angiogram and possible
mechanical thrombectomy.

EXAM:
ULTRASOUND-GUIDED VASCULAR [REDACTED] CEREBRAL ANGIOGRAM
MECHANICAL THROMBECTOMY
INTRACRANIAL STENTING
FLAT PANEL HEAD CT
TECHNIQUE: Informed written consent was obtained from the patient's wife after
a thorough discussion of the procedural risks, benefits and
alternatives. All questions were addressed.

[Series 1: ir (id) (id)/(id) · 1 of 1 slices shown]
[im 1/1]
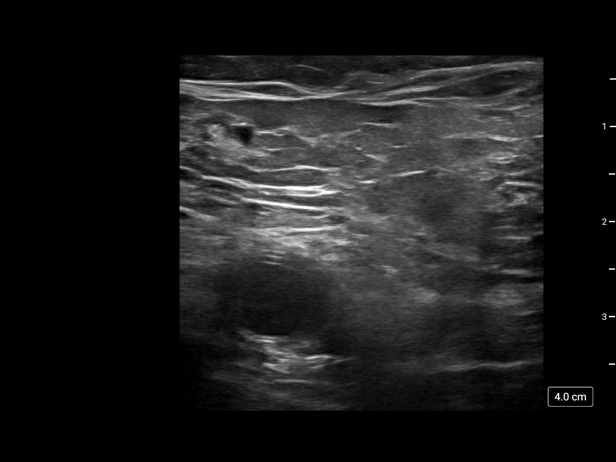

[Series 4: body 3 care · 2 acquisitions, 1 frame shown (1 of 3)]
[im 1/2]
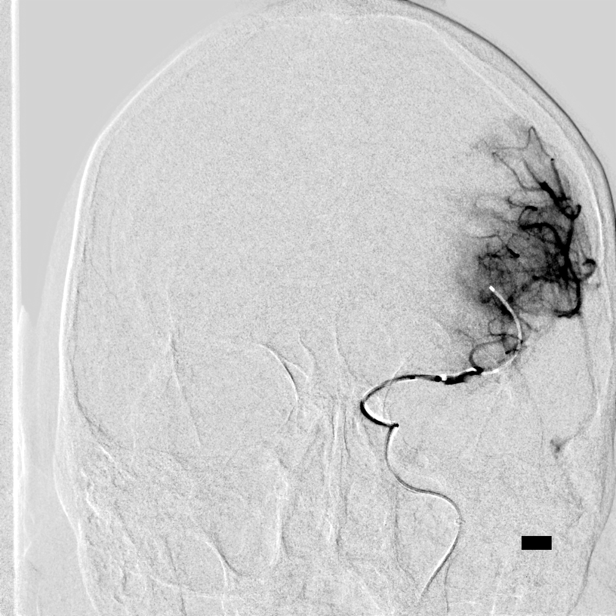

[Series 6: body 3 care · 2 acquisitions, 1 frame shown (2 of 3)]
[im 1/2]
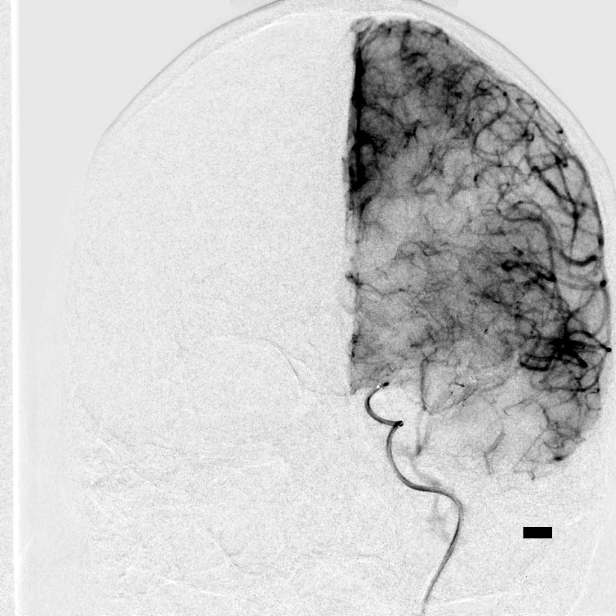

[Series 8: rm angio · 2 acquisitions, 1 frame shown]
[im 1/2]
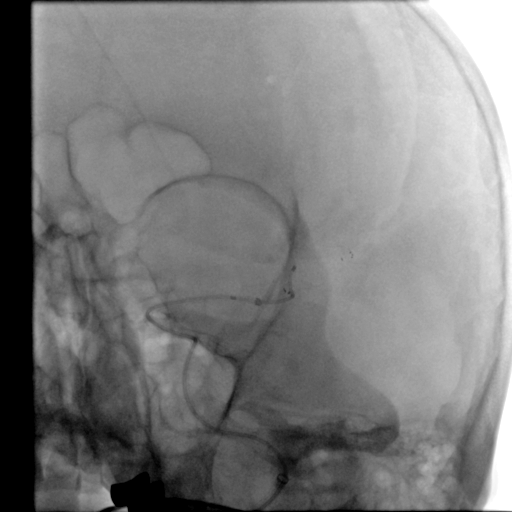

[Series 10: body 3 care · 2 acquisitions, 1 frame shown (3 of 3)]
[im 1/2]
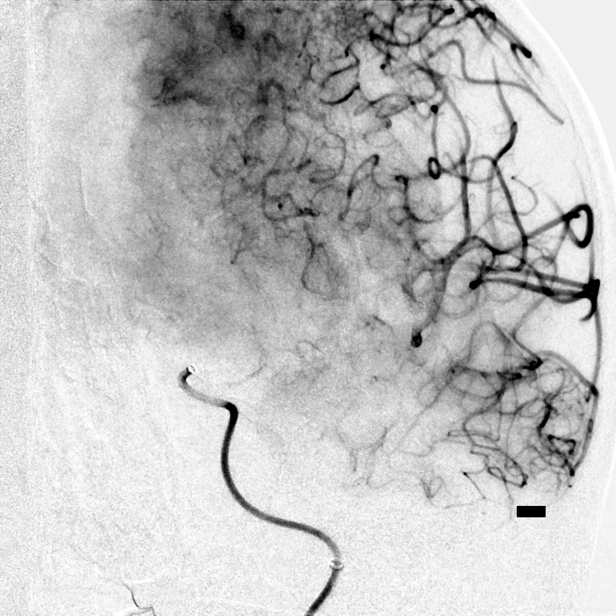

[Series 14: cerebral care 2 · 2 acquisitions, 1 frame shown (1 of 6)]
[im 1/2]
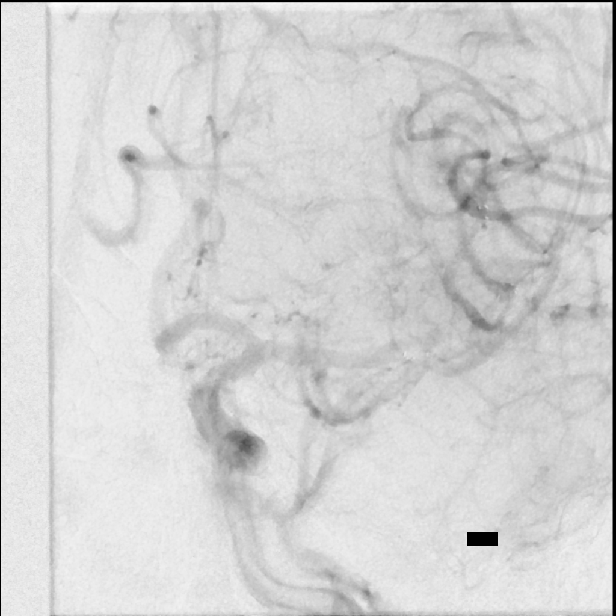

[Series 16: cerebral care 2 · 2 acquisitions, 1 frame shown (2 of 6)]
[im 1/2]
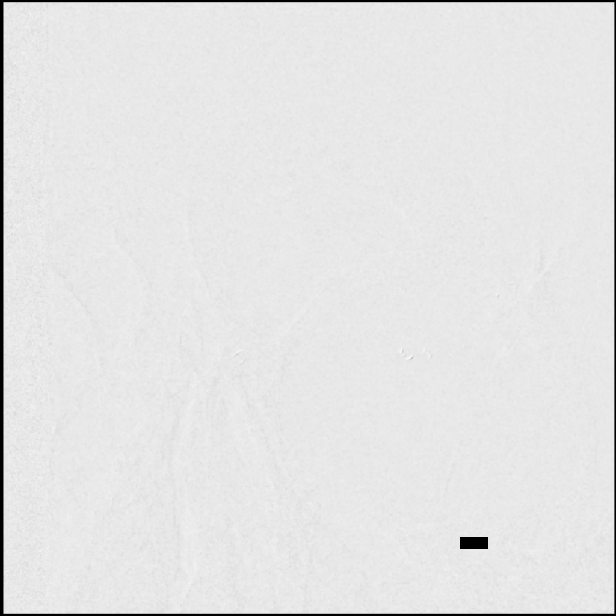

[Series 18: cerebral care 2 · 2 acquisitions, 1 frame shown (3 of 6)]
[im 1/2]
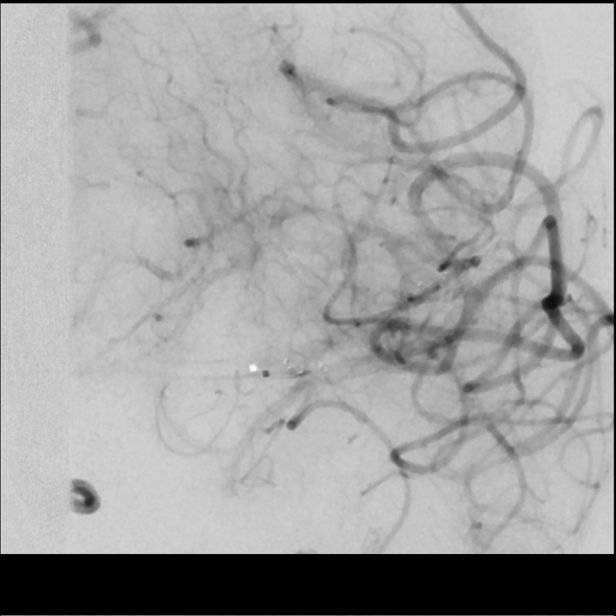

[Series 20: cerebral care 2 · 2 acquisitions, 1 frame shown (4 of 6)]
[im 1/2]
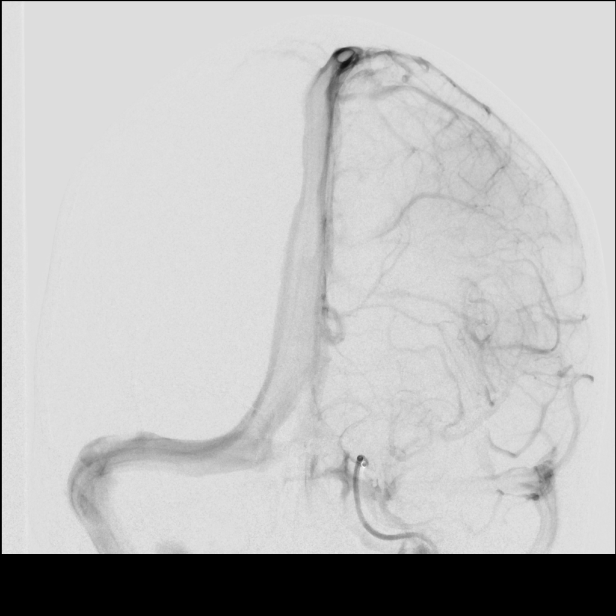

[Series 23: cerebral care 2 · 2 acquisitions, 1 frame shown (5 of 6)]
[im 1/2]
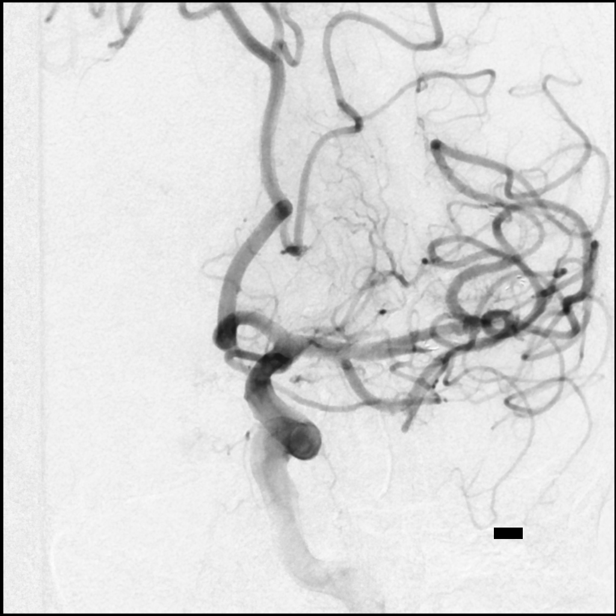

[Series 25: cerebral care 2 · 1 of 8 frames shown (6 of 6)]
[frame 2/8]
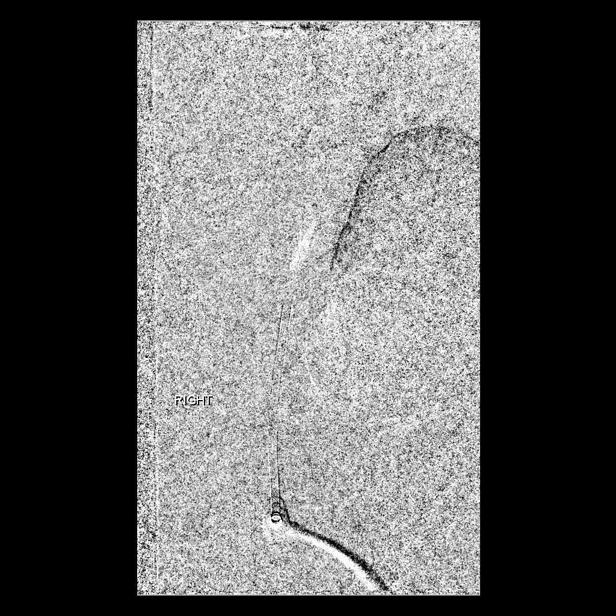

[11 of 24 positions shown; findings below may reference images not displayed]

MEDICATIONS:
Cangrelor IV bolus and drip

ANESTHESIA/SEDATION:
The procedure was performed under general anesthesia.

CONTRAST:  120 mL of Omnipaque 300 milligram/mL

FLUOROSCOPY:
Radiation Exposure Index (as provided by the fluoroscopic device):
[N6] all mGy Kerma

COMPLICATIONS:
None immediate.
Maximal Sterile Barrier Technique was utilized including caps, mask,
sterile gowns, sterile gloves, sterile drape, hand hygiene and skin
antiseptic. A timeout was performed prior to the initiation of the
procedure.

The right groin was prepped and draped in the usual sterile fashion.
Using a micropuncture kit and the modified Seldinger technique,
access was gained to the right common femoral artery and an 8 French
sheath was placed. Real-time ultrasound guidance was utilized for
vascular access including the acquisition of a permanent ultrasound
image documenting patency of the accessed vessel.

Under fluoroscopy, an 8 Emboguard balloon guide catheter was
navigated over a 6 [facility] 2 catheter and a 0.035" Terumo
Glidewire into the aortic arch. The catheter was placed into the
left common carotid artery and then advanced into the left internal
carotid artery. The diagnostic catheter was removed. Frontal and
lateral angiograms of the head were obtained.
FINDINGS: 1. Patent right common femoral artery.
2. Occlusion of the distal left M1/MCA.

PROCEDURE:
Using biplane roadmap, a Zoom 55 aspiration catheter was navigated
over an Aristotle 24 microguidewire into the cavernous segment of
the left ICA. The aspiration catheter was then advanced to the level
of occlusion and connected to an aspiration pump. Continuous
aspiration was performed for 2 minutes. The guide catheter was
connected to a VacLok syringe. The aspiration catheter was
subsequently removed under constant aspiration. The guide catheter
was aspirated for debris.

Left internal carotid artery angiogram showed recanalization of the
left M1/MCA segment and left M2/MCA anterior division branch.
Persistent occlusion of the posterior division branch.

Using biplane roadmap guidance, a zoom 35 aspiration catheter was
navigated over a Prowler EX microcatheter and a Aristotle 18
microguidewire into the cavernous segment of the left ICA. The
microcatheter was then navigated over the wire into the left M2/MCA
posterior division branch. Magnified frontal and lateral angiograms
was performed via microcatheter contrast injection. The
microcatheter was then retracted and magnified frontal and lateral
angiograms was obtained via aspiration catheter contrast injection.
A angiograms showed dissection flap in the left M2/MCA posterior
division branch.

Using biplane roadmap guidance, the microcatheter was then advanced
over the microwire into the left M3/MCA posterior division branch.
Then, a 4 x 30 mm enterprise intracranial stent was deployed
spanning the M2 and M3 segments.

Left internal carotid artery angiograms were obtained showing
adequate stent positioning. Recanalization of the angular branch was
obtained with stent placement. However persistent occlusion of an M3
branch to the perirolandic area was seen.

Using biplane roadmap guidance, a headway duo microcatheter was then
advanced over a synchro 14 microwire into the left M3/MCA posterior
division parietal branch. Then, a 4.5 x 30 mm neuroform atlas
intracranial stent was deployed spanning the M2 and M3 segments,
partially overlapping the previously deployed stent.

Left internal carotid artery angiogram showed adequate stent
position with recanalization of the parietal branch.

Flat panel CT of the head was obtained and post processed in a
separate workstation with concurrent attending physician
supervision. Selected images were sent to PACS. No evidence of
hemorrhagic complication.

Follow-up left internal carotid artery angiograms with frontal and
lateral views of the head showed occlusion of the stent construct.

Multiple attempts to navigate a microcatheter through the stent
construct proved unsuccessful.

Using biplane roadmap guidance, a Zoom 35 aspiration catheter was
navigated over a synchro 2 microguidewire into the cavernous segment
of the right ICA. The aspiration catheter was then advanced to the
proximal aspect of the stent construct, at the level of occlusion,
and connected to an aspiration pump. Continuous aspiration was
performed for 2 minutes. The guide catheter was connected to a
VacLok syringe. The aspiration catheter was subsequently removed
under constant aspiration. The guide catheter was aspirated for
debris.

Left internal carotid artery angiogram showed persistent stent
occlusion

Flat panel CT of the head was obtained and post processed in a
separate workstation with concurrent attending physician
supervision. Selected images were sent to PACS. No hemorrhagic
complication noted. Small contrast extravasation noted at the vessel
wall along the resolute deployed stent.

The catheter was subsequently withdrawn.

Right common femoral artery angiogram was obtained in right anterior
oblique view. The puncture is at the level of the common femoral
artery. The artery has normal caliber, adequate for closure device.
The sheath was exchanged over the wire for a Perclose prostyle which
was utilized for access closure. Immediate hemostasis was achieved.
IMPRESSION: 1. Partially successful mechanical thrombectomy performed for distal
left M1/MCA reocclusion with rescue stenting of the left MCA
posterior division branch with intra procedural stent construct
occlusion. The M1/MCA, anterior temporal artery and anterior
division branch remains recanalized (TICI 2A).
2. No hemorrhagic complication.

PLAN:
Continue on Cangrelor IV drip until follow-up head CT is cleared for
any hemorrhagic complication. At this point, patient may be loaded
with aspirin 81 mg and Brilinta 180 mg.

## 2022-02-11 IMAGING — CT CT HEAD W/O CM
4 of 5 series · 14 of 47 positions shown, 16 images · non-contrast
Comparison: Prior CT from earlier the same day.

CLINICAL DATA: Follow-up examination for stroke, status AGNILA
and mechanical thrombectomy.



[Series 3: head without · axial · non-contrast · 0.46mm/px · z∈[-159,-89]mm · 3 of 37 slices shown]
[im 8/37  brain]
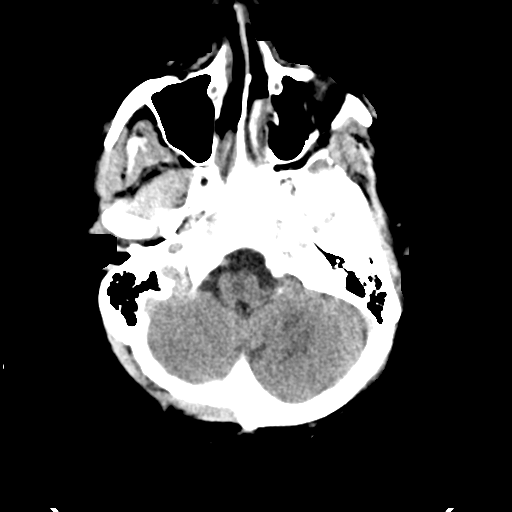
[im 15/37  brain]
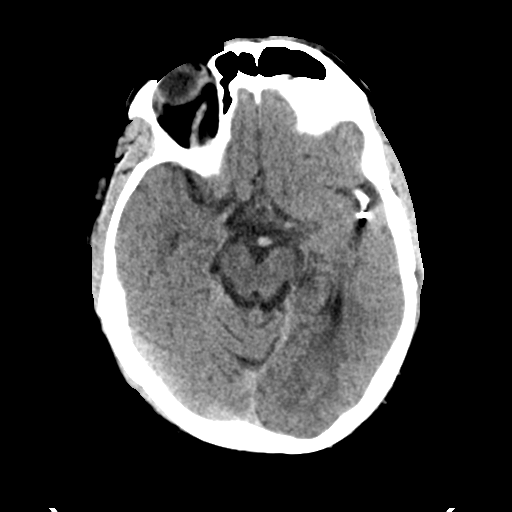
[im 22/37  brain]
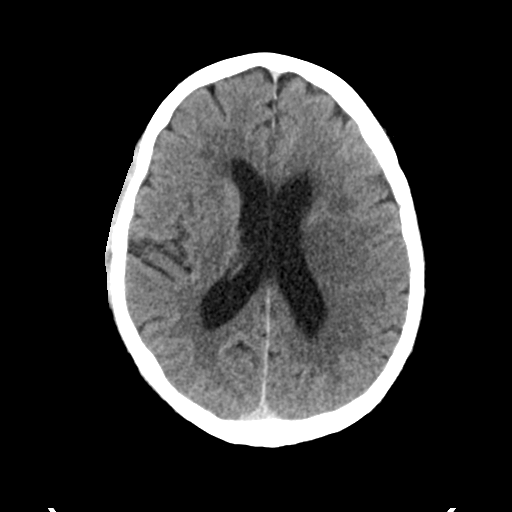

[Series 5: head without cor · coronal · non-contrast · 0.36mm/px · 3 of 70 slices shown]
[im 24/70  brain]
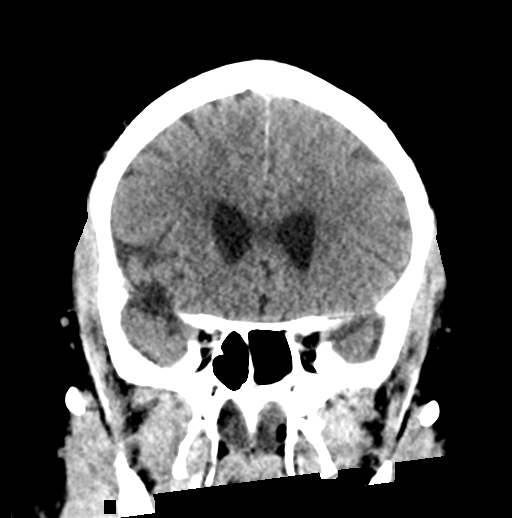
[im 31/70  brain]
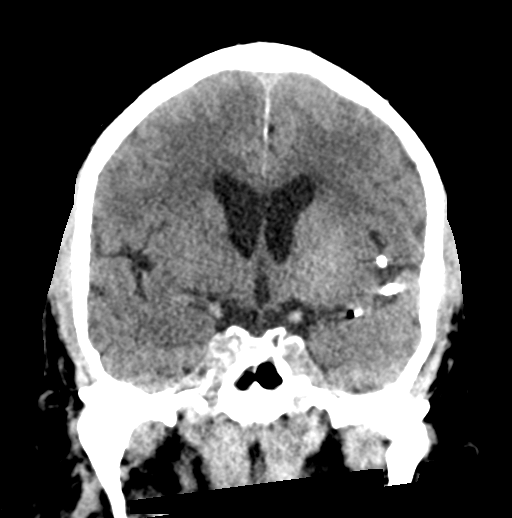
[im 39/70  brain]
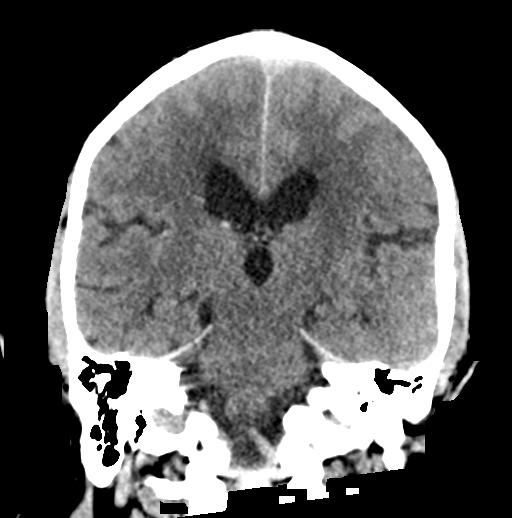

[Series 6: head without sag · sagittal · non-contrast · 0.37mm/px · 3 of 61 slices shown]
[im 23/61  brain]
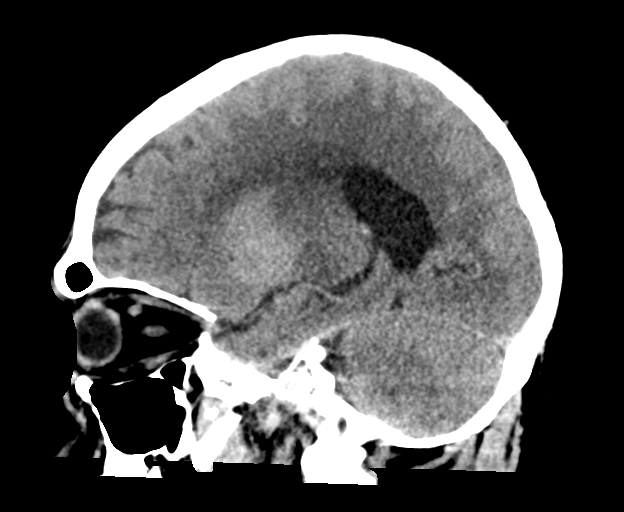
[im 31/61  brain]
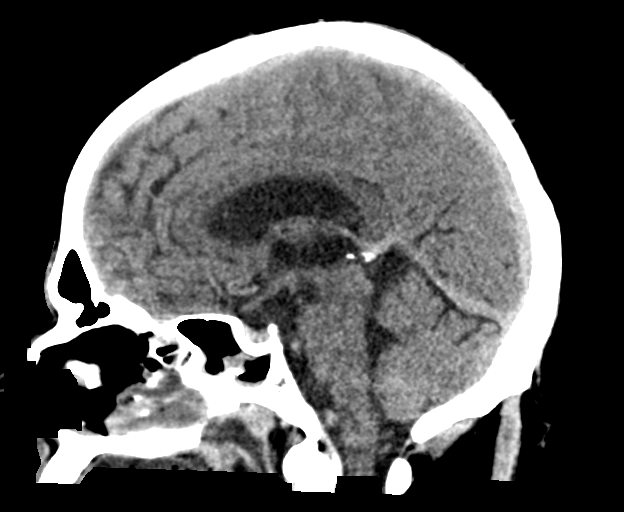
[im 39/61  brain]
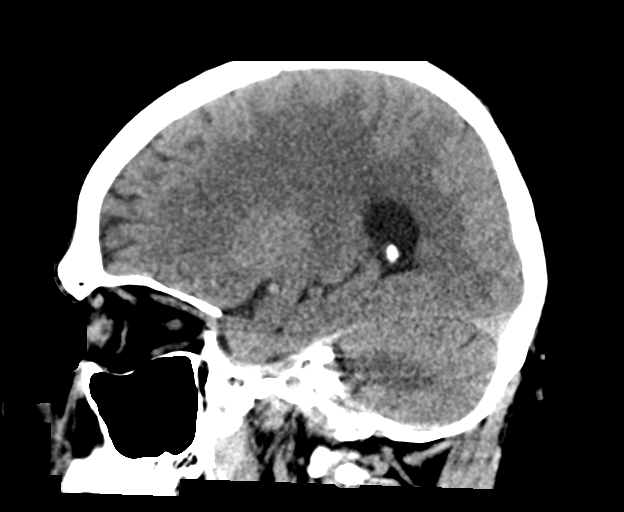

[Series 7: head without ax · axial · non-contrast · 0.36mm/px · z∈[-168,-50]mm · 5 of 38 slices shown, 7 images]
[im 7/38  brain]
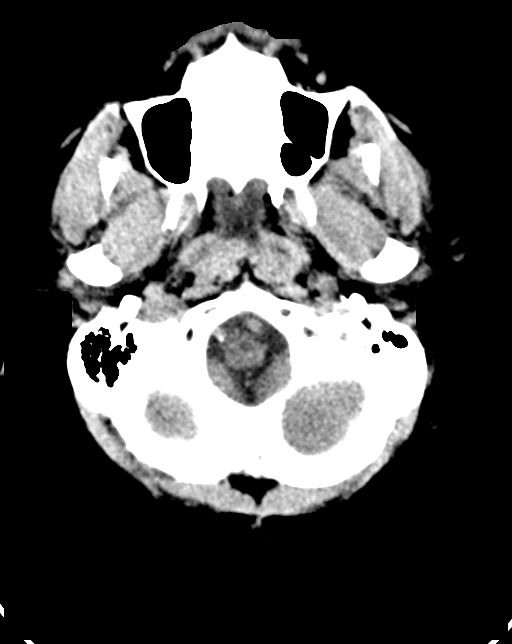
[im 7/38  bone]
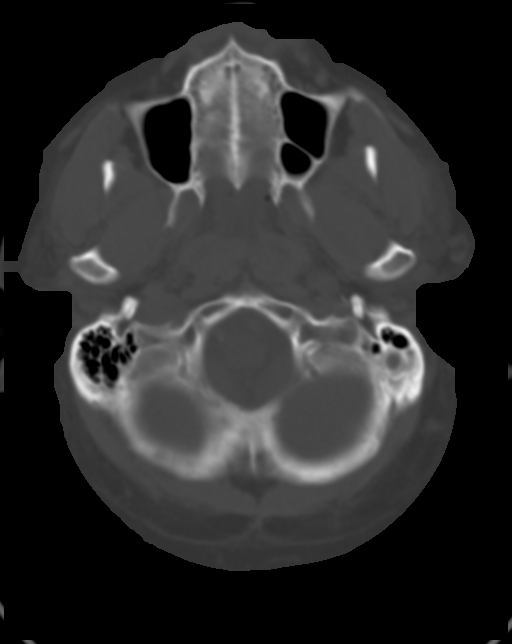
[im 13/38  brain]
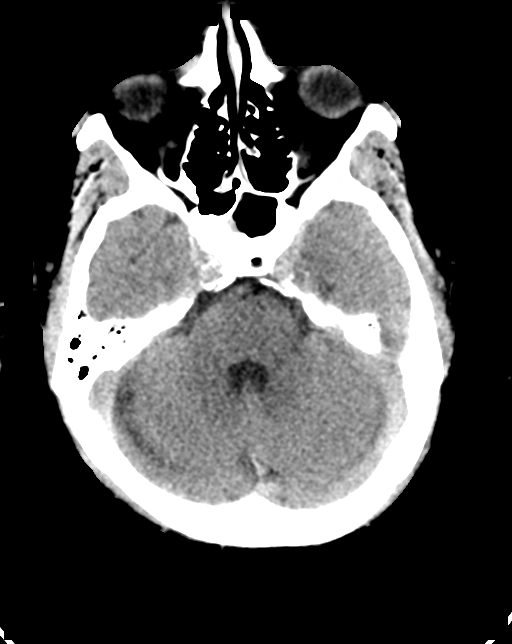
[im 19/38  brain]
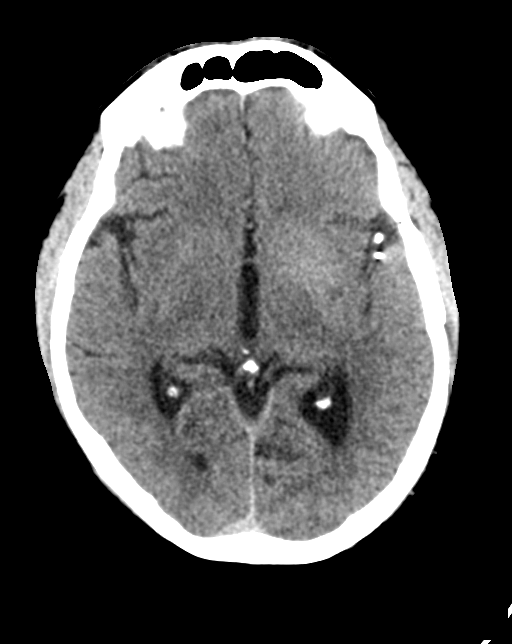
[im 25/38  brain]
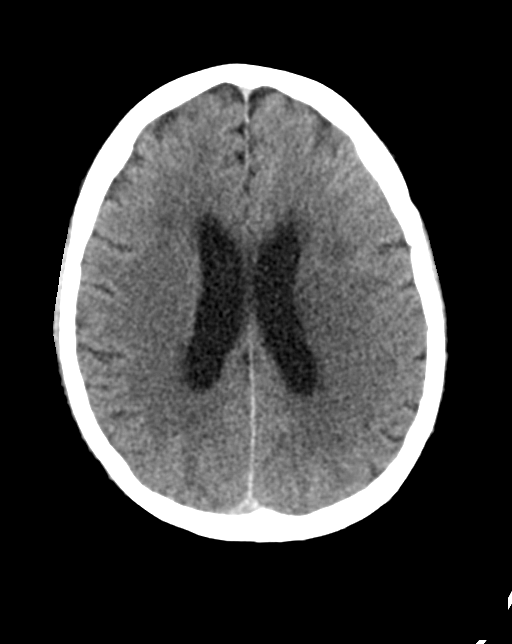
[im 31/38  brain]
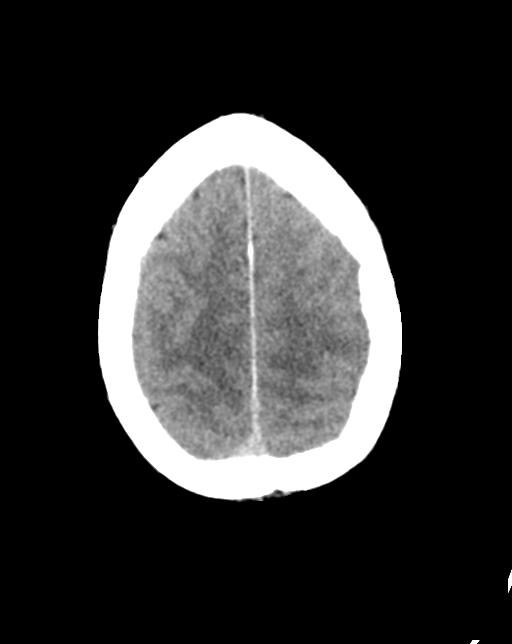
[im 31/38  bone]
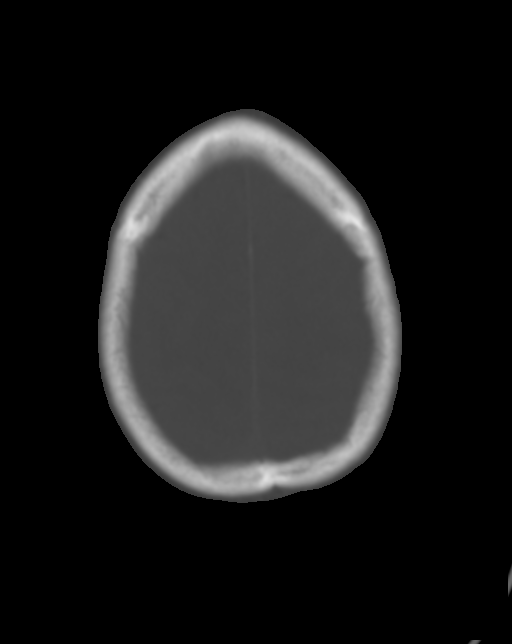

[14 of 47 positions shown; findings below may reference images not displayed]

FINDINGS: Brain: Patchy hypodensity involving the left basal ganglia of an
adjacent left frontotemporal region, consistent with evolving left
MCA distribution infarct. Associated contrast staining at the left
basal ganglia. More dense hyperdensity at the posterior margin of
the left sylvian fissure likely reflects a small amount of acute
subarachnoid hemorrhage (series 3, image 19). No significant
regional mass effect at this time.

No other acute intracranial hemorrhage or large vessel territory
infarct. No mass lesion or midline shift. No hydrocephalus or
extra-axial fluid collection.

Vascular: Interval placement of a Y stent into the distal left
MCA/proximal MCA branches. No visible hyperdense vessel.

Skull: Scalp soft tissues and calvarium demonstrate no new
abnormality.

Sinuses/Orbits: Globes and orbital soft tissues demonstrate no acute
finding. Mucosal thickening noted within the sphenoid ethmoidal
sinuses. Patient is intubated. Trace left mastoid effusion.

Other: None.
IMPRESSION: 1. Evolving left MCA distribution infarct as above. Small volume
hyperdensity at the posterior margin of the left Sylvian fissure
likely reflects acute subarachnoid hemorrhage. No significant
regional mass effect at this time.
2. Interval stenting of the proximal left MCA branches.
3. No other new acute intracranial abnormality.

These results were communicated to Dr. AGNILA at [DATE] on
[DATE] by text page via the AMION messaging system.

## 2022-02-11 IMAGING — CT CT HEAD CODE STROKE
3 of 4 series · 15 of 47 positions shown, 18 images · non-contrast
Comparison: None.

CLINICAL DATA: Code stroke. 60-year-old male with sudden onset
right side weakness.



[Series 2: head w o · axial · 0.47mm/px · z∈[+8,+148]mm · 9 of 34 slices shown, 12 images]
[im 3/34  brain]
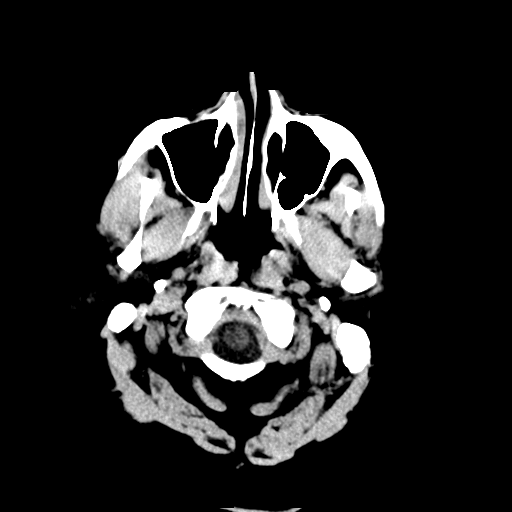
[im 3/34  bone]
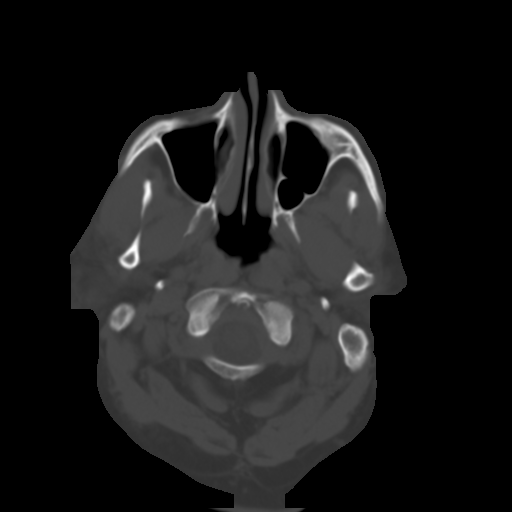
[im 8/34  brain]
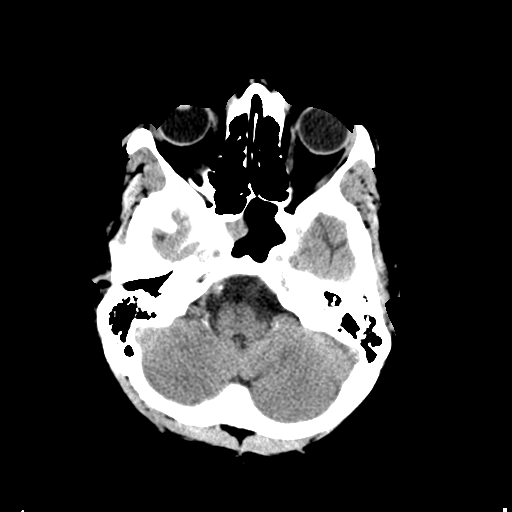
[im 10/34  brain]
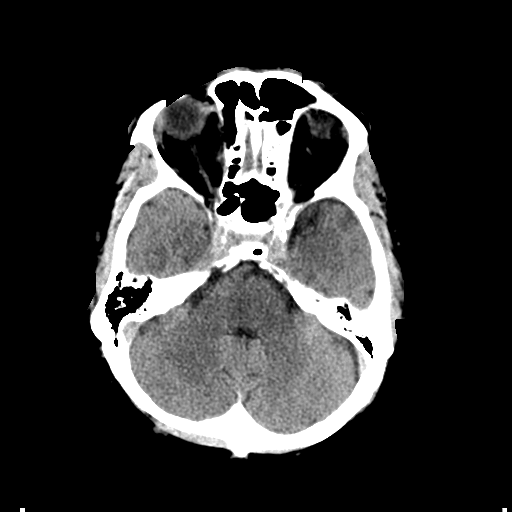
[im 15/34  brain]
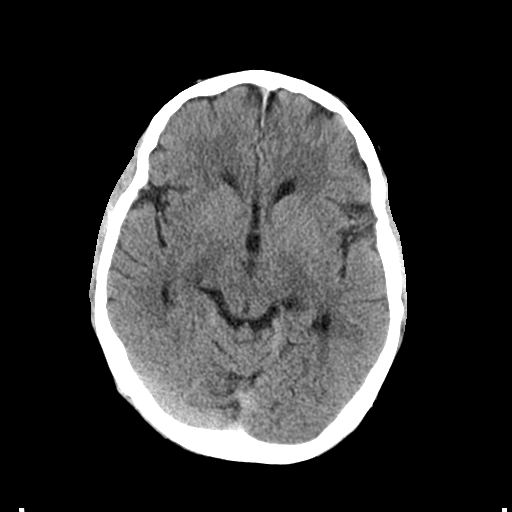
[im 17/34  brain]
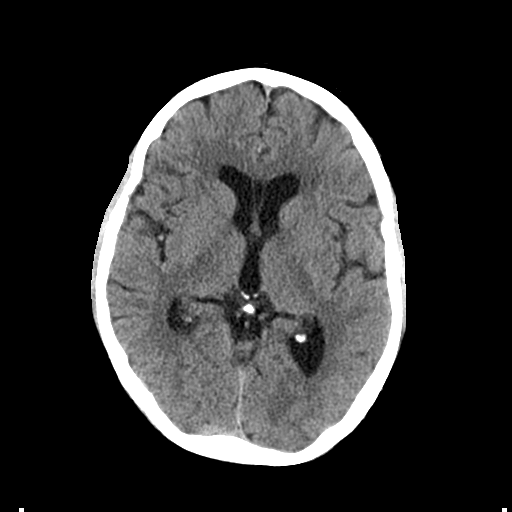
[im 17/34  bone]
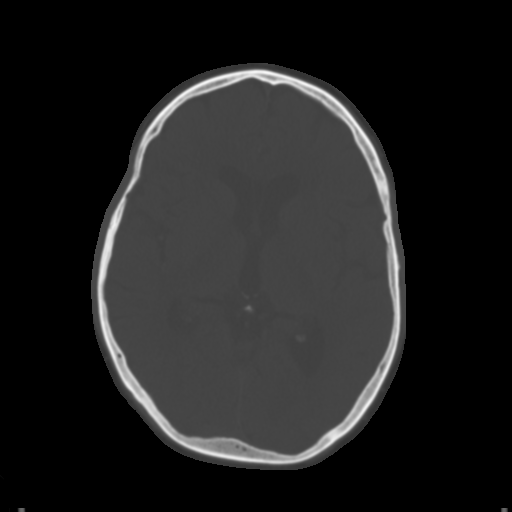
[im 19/34  brain]
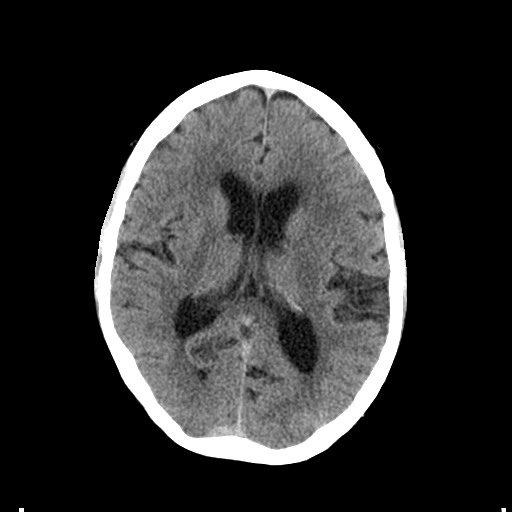
[im 24/34  brain]
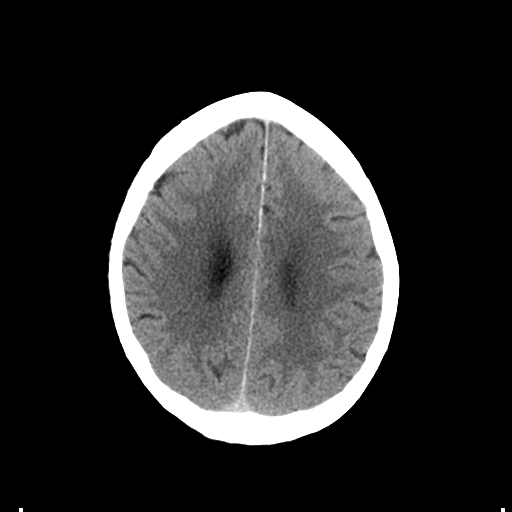
[im 26/34  brain]
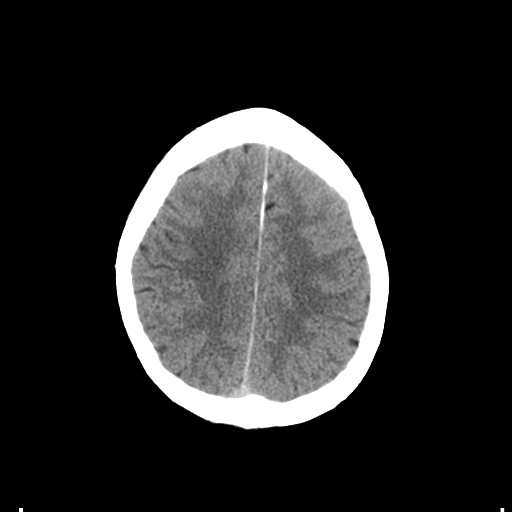
[im 31/34  brain]
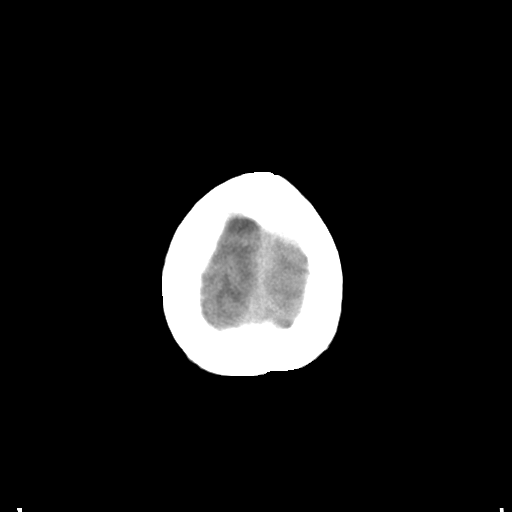
[im 31/34  bone]
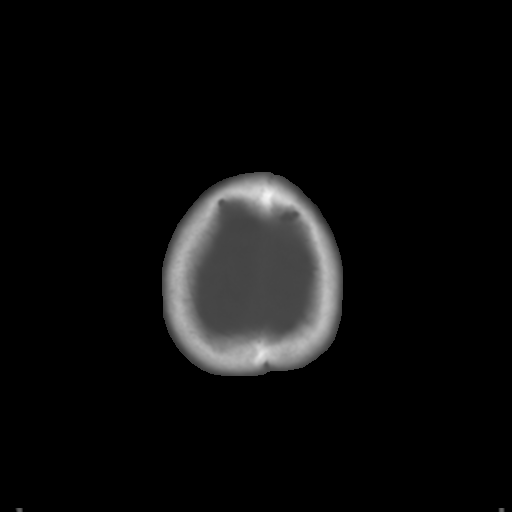

[Series 4: coronal soft · coronal · 0.35mm/px · 3 of 67 slices shown]
[im 23/67  brain]
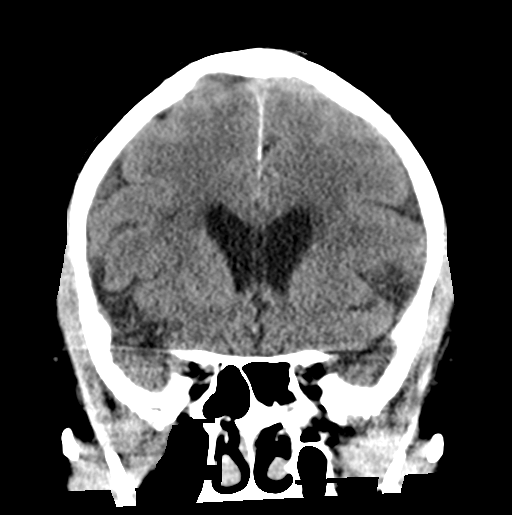
[im 30/67  brain]
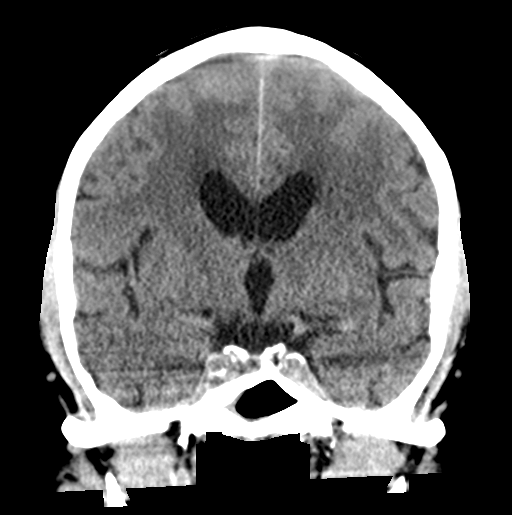
[im 37/67  brain]
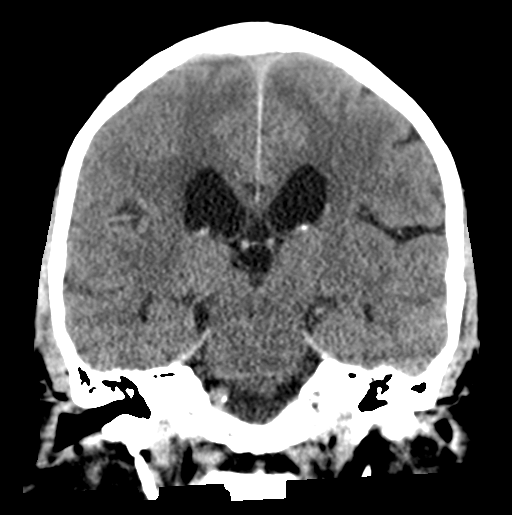

[Series 5: sagittal soft · sagittal · 0.35mm/px · 3 of 56 slices shown]
[im 19/56  brain]
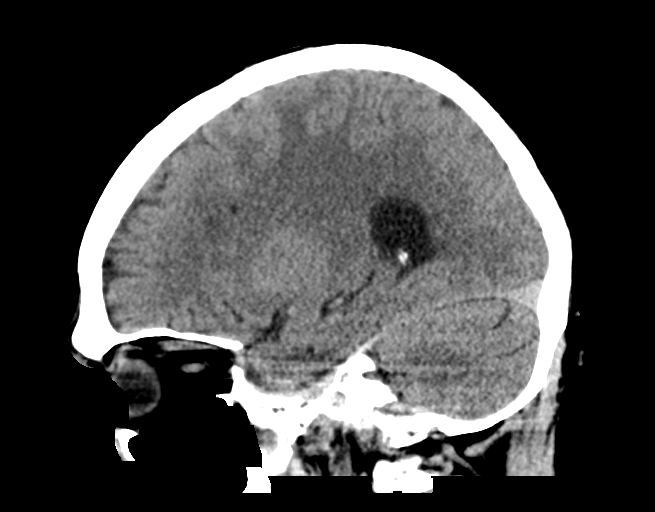
[im 28/56  brain]
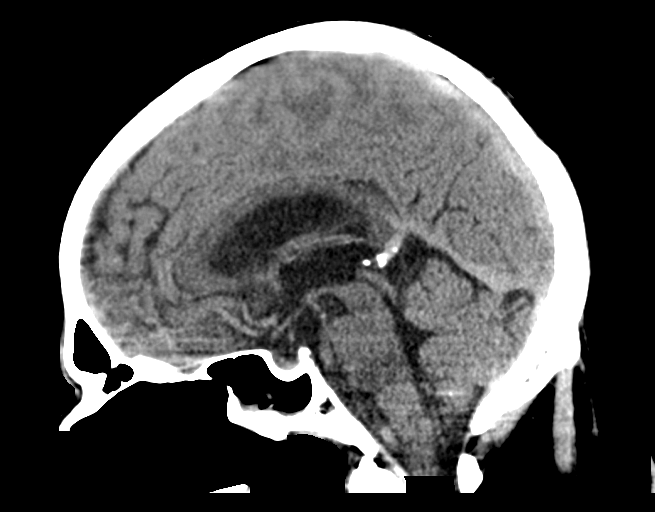
[im 37/56  brain]
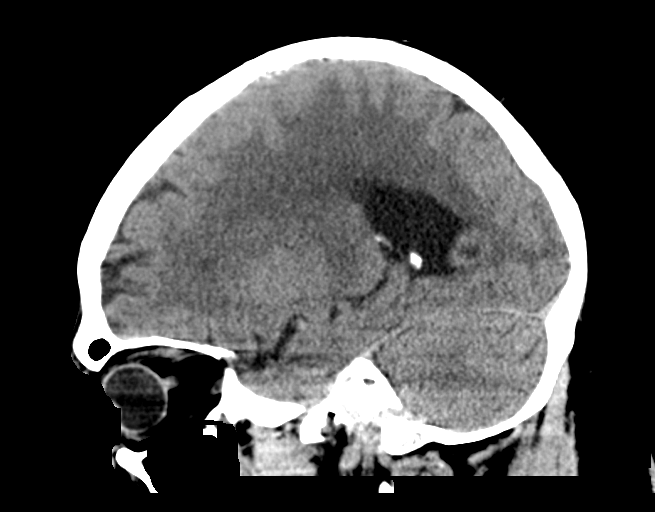

[15 of 47 positions shown; findings below may reference images not displayed]

FINDINGS: Brain: Hyperdense left MCA (series 5, image 39). But only subtle a
cortical and subcortical hypodensity in the left middle frontal
gyrus posteriorly. No other CT changes of acute cortically based
infarct.

No midline shift, ventriculomegaly, mass effect, evidence of mass
lesion, intracranial hemorrhage or evidence of cortically based
acute infarction.

Vascular: Calcified atherosclerosis at the skull base.

Skull: Negative.

Sinuses/Orbits: Chronic appearing right sphenoid sinus inflammation
with mucoperiosteal thickening. Other Visualized paranasal sinuses
and mastoids are clear.

Other: Visualized orbits and scalp soft tissues are within normal
limits.

ASPECTS (Alberta Stroke Program Early CT Score)

- Ganglionic level infarction (caudate, lentiform nuclei, internal
capsule, insula, M1-M3 cortex):

- Supraganglionic infarction (M4-M6 cortex): 7

Total score (0-10 with 10 being normal): 2 (abnormal M5).
IMPRESSION: 1. Hyperdense Left MCA highly suspicious for ALANIS. Subtle if any
Left MCA infarct changes by CT = ASPECTS 9.
2. No intracranial hemorrhage or mass effect.
3. Study discussed by telephone with Dr. ALANIS on [DATE] at
[DATE]. Stat CTA pending.

## 2022-02-11 IMAGING — MR MR HEAD W/O CM
12 of 13 series · 44 of 48 positions shown · non-contrast
Comparison: CT head pre and post procedure [DATE]

CLINICAL DATA: Stroke.  Left MCA thrombectomy [DATE]

EXAM:
MRI HEAD WITHOUT CONTRAST
TECHNIQUE: Multiplanar, multiecho pulse sequences of the brain and surrounding
structures were obtained without intravenous contrast.

[Series 5: DWI · axial · 3.0mm · 0.92mm/px · z∈[-92,+57]mm · 8 of 104 slices shown (1 of 4)]
[im 1/104]
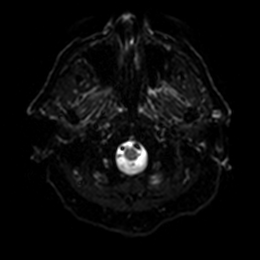
[im 15/104]
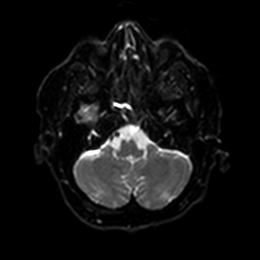
[im 30/104]
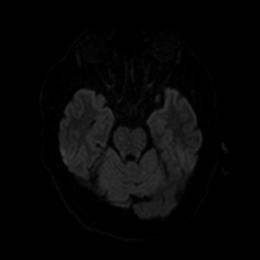
[im 45/104]
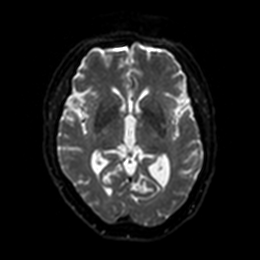
[im 59/104]
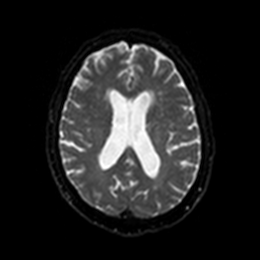
[im 74/104]
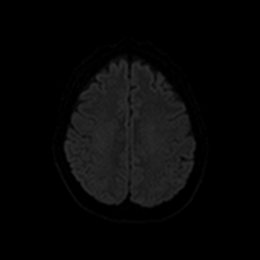
[im 89/104]
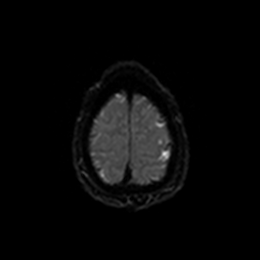
[im 104/104]
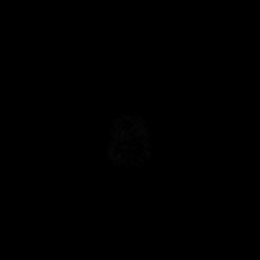

[Series 6: DWI · axial · 3.0mm · 0.92mm/px · z∈[-92,+57]mm · 4 of 52 slices shown (2 of 4)]
[im 1/52]
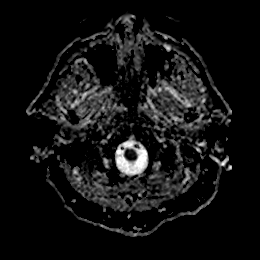
[im 18/52]
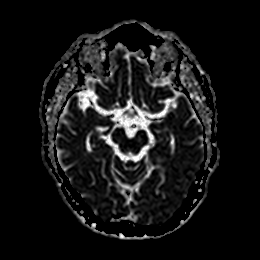
[im 35/52]
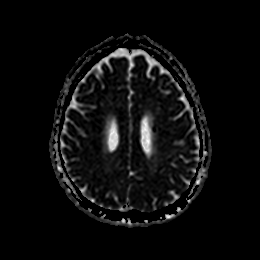
[im 52/52]
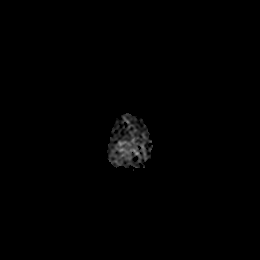

[Series 7: DWI · coronal · 4.0mm · 0.92mm/px · 5 of 72 slices shown (3 of 4)]
[im 1/72]
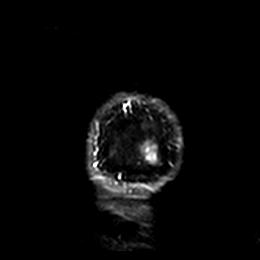
[im 18/72]
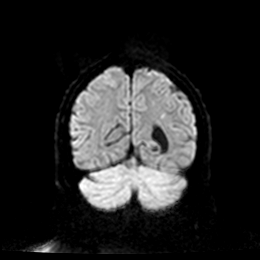
[im 36/72]
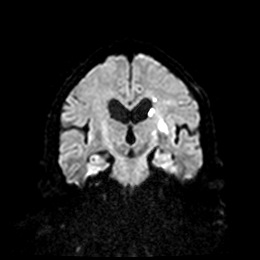
[im 54/72]
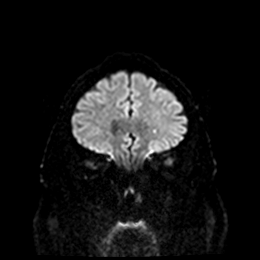
[im 72/72]
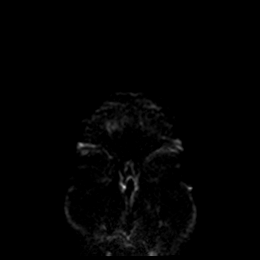

[Series 8: DWI · coronal · 4.0mm · 0.92mm/px · 3 of 36 slices shown (4 of 4)]
[im 1/36]
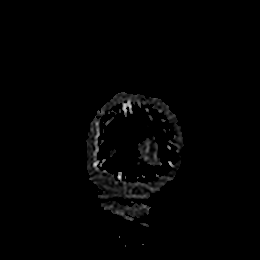
[im 18/36]
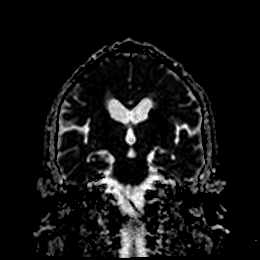
[im 36/36]
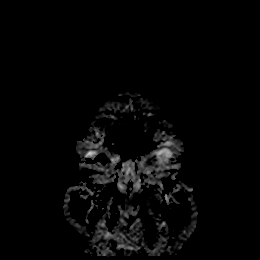

[Series 9: T1 · sagittal · 5.0mm · 0.78mm/px · 2 of 27 slices shown]
[im 1/27]
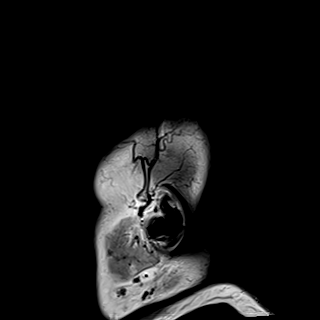
[im 27/27]
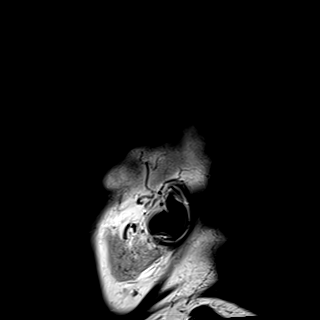

[Series 10: T2 · axial · 5.0mm · 0.72mm/px · z∈[-95,+58]mm · 2 of 27 slices shown (1 of 2)]
[im 1/27]
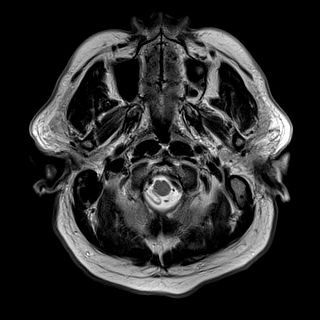
[im 27/27]
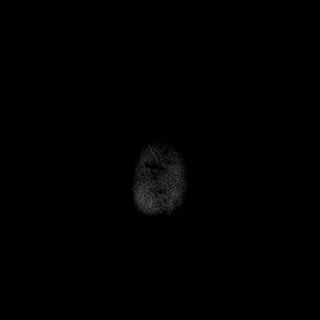

[Series 11: FLAIR · axial · 5.0mm · 0.45mm/px · z∈[-95,+58]mm · 2 of 27 slices shown]
[im 1/27]
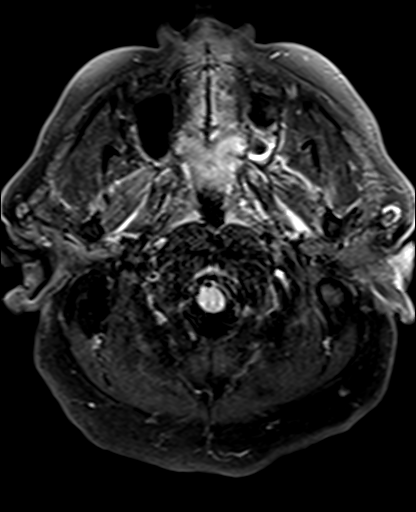
[im 27/27]
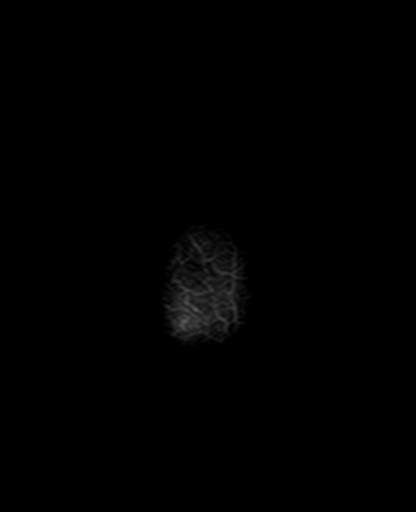

[Series 12: mag_images · axial · 3.0mm · 0.94mm/px · z∈[-105,+56]mm · 4 of 56 slices shown]
[im 1/56]
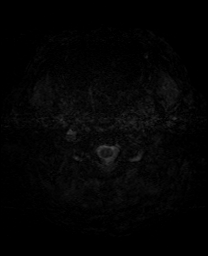
[im 19/56]
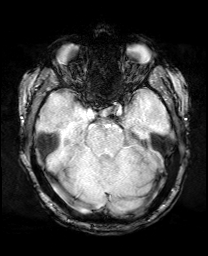
[im 37/56]
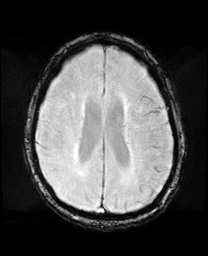
[im 56/56]
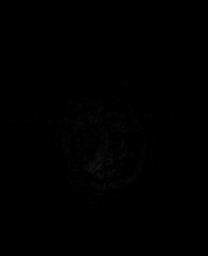

[Series 13: pha_images · axial · 3.0mm · 0.94mm/px · z∈[-102,+50]mm · 4 of 53 slices shown]
[im 1/53]
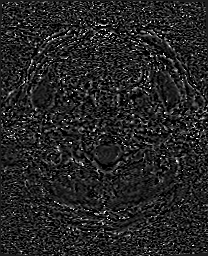
[im 18/53]
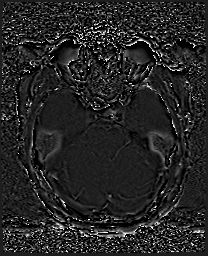
[im 35/53]
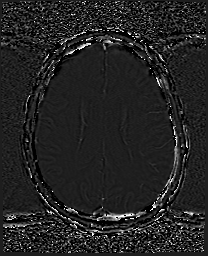
[im 53/53]
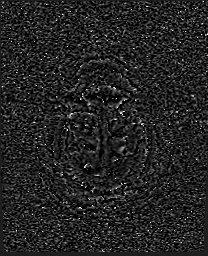

[Series 14: swi_images · axial · 3.0mm · 0.94mm/px · z∈[-105,+56]mm · 4 of 56 slices shown]
[im 1/56]
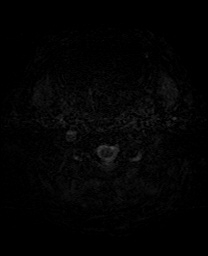
[im 19/56]
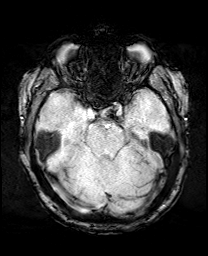
[im 37/56]
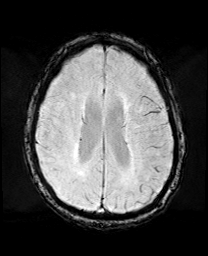
[im 56/56]
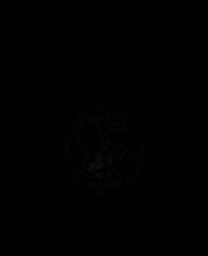

[Series 15: mip_images(sw) · axial · 24.0mm · 0.94mm/px · z∈[-95,+46]mm · 4 of 49 slices shown]
[im 1/49]
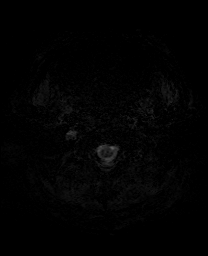
[im 17/49]
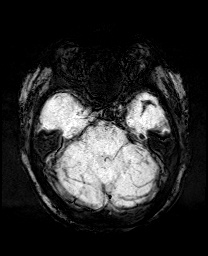
[im 33/49]
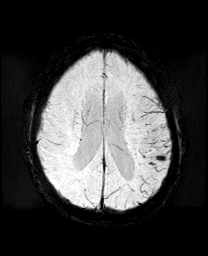
[im 49/49]
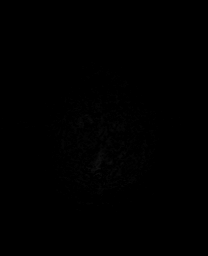

[Series 17: T2 · coronal · 5.0mm · 0.36mm/px · 2 of 31 slices shown (2 of 2)]
[im 1/31]
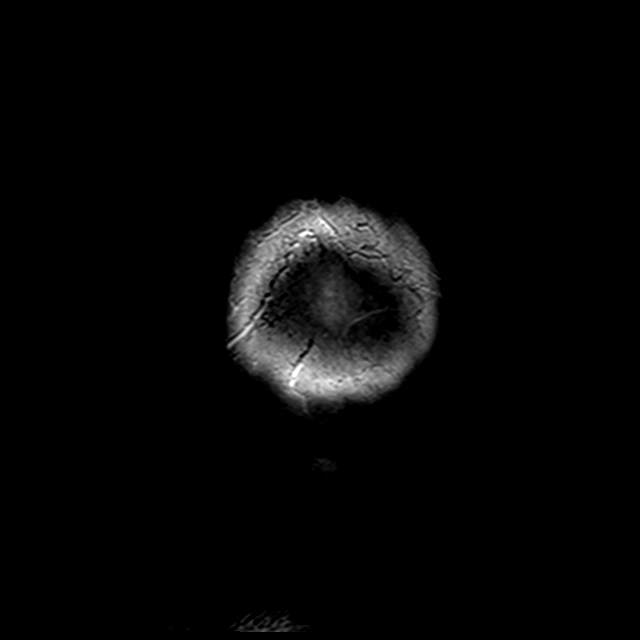
[im 31/31]
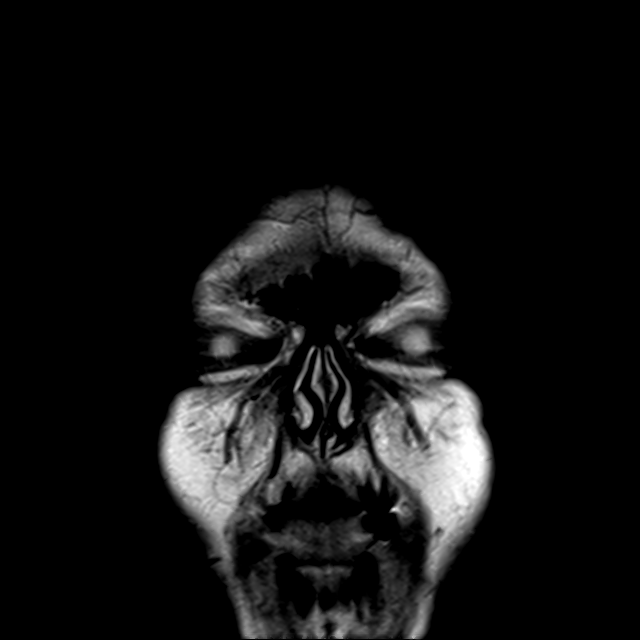

[44 of 48 positions shown; findings below may reference images not displayed]

FINDINGS: Brain: Acute infarct left MCA territory. Small areas of restricted
diffusion in the left caudate and left putamen. Small areas of
restricted diffusion in the left insula, left frontal white matter,
and left parietal cortex and white matter. Small areas of petechial
hemorrhage in the left parietal lobe compatible with mild
hemorrhagic transformation. This area shows minimal restricted
diffusion.

Mild chronic microvascular ischemic change in the white matter. No
mass lesion.

Vascular: Abnormal flow related signal left MCA bifurcation on T2.
This also show susceptibility on SWI. This is compatible with
residual thrombus left MCA bifurcation. Otherwise normal arterial
flow voids.

Skull and upper cervical spine: No focal skeletal abnormality.

Sinuses/Orbits: Mild mucosal edema paranasal sinuses. Mild left
mastoid effusion. Negative orbit

Other: None
IMPRESSION: 1. Small areas of acute infarct in the left MCA territory involving
left caudate, putamen, insula, and left frontal and parietal lobe.
Mild hemorrhagic transformation in the left parietal lobe compatible
with INOUE 1a hemorrhage. No mass-effect or midline
shift. There is minimal restricted diffusion in this area on DWI.
2. Susceptibility in the left MCA bifurcation compatible with
residual vascular thrombus.
3. No remote areas of acute infarct. Ventricle size within normal
limits.
4. These results were called by telephone at the time of
interpretation on [DATE] at [DATE] to provider INOUE
, who verbally acknowledged these results.

## 2022-02-11 SURGERY — RADIOLOGY WITH ANESTHESIA
Anesthesia: General

## 2022-02-11 SURGERY — IR WITH ANESTHESIA
Anesthesia: General

## 2022-02-11 MED ORDER — SODIUM CHLORIDE 0.9 % IV SOLN: INTRAVENOUS | Status: DC

## 2022-02-11 MED ORDER — ASPIRIN 81 MG PO CHEW: 81.0000 mg | CHEWABLE_TABLET | Freq: Once | ORAL | Status: AC

## 2022-02-11 MED ORDER — PROPOFOL 1000 MG/100ML IV EMUL
5.0000 ug/kg/min | INTRAVENOUS | Status: DC
  Administered 2022-02-11: 60 ug/kg/min via INTRAVENOUS
  Administered 2022-02-12: 15 ug/kg/min via INTRAVENOUS
  Administered 2022-02-12: 65 ug/kg/min via INTRAVENOUS
  Administered 2022-02-12: 30 ug/kg/min via INTRAVENOUS
  Filled 2022-02-11 (×4): qty 100

## 2022-02-11 MED ORDER — ROCURONIUM BROMIDE 10 MG/ML (PF) SYRINGE
PREFILLED_SYRINGE | INTRAVENOUS | Status: DC | PRN
  Administered 2022-02-11: 30 mg via INTRAVENOUS
  Administered 2022-02-11: 40 mg via INTRAVENOUS
  Administered 2022-02-11: 10 mg via INTRAVENOUS
  Administered 2022-02-11 (×2): 20 mg via INTRAVENOUS

## 2022-02-11 MED ORDER — CLEVIDIPINE BUTYRATE 0.5 MG/ML IV EMUL
INTRAVENOUS | Status: AC
  Administered 2022-02-11: 2 mg/h via INTRAVENOUS
  Filled 2022-02-11: qty 50

## 2022-02-11 MED ORDER — DEXAMETHASONE SODIUM PHOSPHATE 10 MG/ML IJ SOLN
INTRAMUSCULAR | Status: DC | PRN
  Administered 2022-02-11: 5 mg via INTRAVENOUS

## 2022-02-11 MED ORDER — PROPOFOL 1000 MG/100ML IV EMUL
5.0000 ug/kg/min | INTRAVENOUS | Status: DC
  Administered 2022-02-11: 60 ug/kg/min via INTRAVENOUS

## 2022-02-11 MED ORDER — HYDROMORPHONE HCL 1 MG/ML IJ SOLN
1.0000 mg | Freq: Once | INTRAMUSCULAR | Status: AC
  Administered 2022-02-11: 1 mg via INTRAVENOUS
  Filled 2022-02-11: qty 1

## 2022-02-11 MED ORDER — PHENYLEPHRINE HCL-NACL 20-0.9 MG/250ML-% IV SOLN
INTRAVENOUS | Status: DC | PRN
  Administered 2022-02-11: 25 ug/min via INTRAVENOUS

## 2022-02-11 MED ORDER — TENECTEPLASE FOR STROKE: 25.0000 mg | PACK | Freq: Once | INTRAVENOUS | Status: AC

## 2022-02-11 MED ORDER — ACETAMINOPHEN 650 MG RE SUPP: 650.0000 mg | RECTAL | Status: DC | PRN

## 2022-02-11 MED ORDER — ACETAMINOPHEN 325 MG PO TABS: 650.0000 mg | ORAL_TABLET | ORAL | Status: DC | PRN

## 2022-02-11 MED ORDER — SODIUM CHLORIDE 0.9 % IV SOLN
INTRAVENOUS | Status: DC | PRN
  Administered 2022-02-11: 2 ug/kg/min via INTRAVENOUS

## 2022-02-11 MED ORDER — CHLORHEXIDINE GLUCONATE 0.12% ORAL RINSE (MEDLINE KIT)
15.0000 mL | Freq: Two times a day (BID) | OROMUCOSAL | Status: DC
  Administered 2022-02-12 – 2022-02-16 (×9): 15 mL via OROMUCOSAL

## 2022-02-11 MED ORDER — FENTANYL CITRATE (PF) 100 MCG/2ML IJ SOLN
INTRAMUSCULAR | Status: AC
  Filled 2022-02-11: qty 2

## 2022-02-11 MED ORDER — ONDANSETRON HCL 4 MG/2ML IJ SOLN: 4.0000 mg | Freq: Four times a day (QID) | INTRAMUSCULAR | Status: DC | PRN

## 2022-02-11 MED ORDER — TICAGRELOR 90 MG PO TABS
90.0000 mg | ORAL_TABLET | Freq: Once | ORAL | Status: AC
  Administered 2022-02-12: 90 mg
  Filled 2022-02-11: qty 1

## 2022-02-11 MED ORDER — LABETALOL HCL 5 MG/ML IV SOLN
INTRAVENOUS | Status: AC
  Filled 2022-02-11: qty 4

## 2022-02-11 MED ORDER — SUCCINYLCHOLINE CHLORIDE 200 MG/10ML IV SOSY
PREFILLED_SYRINGE | INTRAVENOUS | Status: DC | PRN
  Administered 2022-02-11: 160 mg via INTRAVENOUS

## 2022-02-11 MED ORDER — NALOXONE HCL 0.4 MG/ML IJ SOLN: 0.4000 mg | INTRAMUSCULAR | Status: DC | PRN

## 2022-02-11 MED ORDER — CANGRELOR TETRASODIUM 50 MG IV SOLR
INTRAVENOUS | Status: AC
  Filled 2022-02-11: qty 50

## 2022-02-11 MED ORDER — ROCURONIUM BROMIDE 10 MG/ML (PF) SYRINGE
PREFILLED_SYRINGE | INTRAVENOUS | Status: DC | PRN
  Administered 2022-02-11: 50 mg via INTRAVENOUS
  Administered 2022-02-11: 10 mg via INTRAVENOUS

## 2022-02-11 MED ORDER — SODIUM CHLORIDE 0.9 % IV SOLN: INTRAVENOUS | Status: DC | PRN

## 2022-02-11 MED ORDER — TICAGRELOR 90 MG PO TABS: 180.0000 mg | ORAL_TABLET | Freq: Once | ORAL | Status: AC

## 2022-02-11 MED ORDER — FENTANYL BOLUS VIA INFUSION
50.0000 ug | INTRAVENOUS | Status: DC | PRN
  Filled 2022-02-11: qty 100

## 2022-02-11 MED ORDER — PANTOPRAZOLE SODIUM 40 MG IV SOLR
40.0000 mg | Freq: Every day | INTRAVENOUS | Status: DC
  Administered 2022-02-11 – 2022-02-13 (×3): 40 mg via INTRAVENOUS
  Filled 2022-02-11 (×3): qty 10

## 2022-02-11 MED ORDER — CANGRELOR BOLUS VIA INFUSION
INTRAVENOUS | Status: DC | PRN
  Administered 2022-02-11: 3210 ug via INTRAVENOUS

## 2022-02-11 MED ORDER — ASPIRIN 81 MG PO CHEW
81.0000 mg | CHEWABLE_TABLET | Freq: Once | ORAL | Status: AC
  Administered 2022-02-12: 81 mg
  Filled 2022-02-11: qty 1

## 2022-02-11 MED ORDER — FENTANYL CITRATE (PF) 250 MCG/5ML IJ SOLN
INTRAMUSCULAR | Status: DC | PRN
  Administered 2022-02-11: 50 ug via INTRAVENOUS

## 2022-02-11 MED ORDER — ACETAMINOPHEN 160 MG/5ML PO SOLN: 650.0000 mg | ORAL | Status: DC | PRN

## 2022-02-11 MED ORDER — SUGAMMADEX SODIUM 200 MG/2ML IV SOLN
INTRAVENOUS | Status: DC | PRN
  Administered 2022-02-11: 225 mg via INTRAVENOUS

## 2022-02-11 MED ORDER — ASPIRIN 81 MG PO CHEW
81.0000 mg | CHEWABLE_TABLET | Freq: Every day | ORAL | Status: DC
  Administered 2022-02-12 – 2022-02-18 (×7): 81 mg
  Filled 2022-02-11 (×6): qty 1

## 2022-02-11 MED ORDER — LACTATED RINGERS IV SOLN: INTRAVENOUS | Status: DC | PRN

## 2022-02-11 MED ORDER — LIDOCAINE 2% (20 MG/ML) 5 ML SYRINGE
INTRAMUSCULAR | Status: DC | PRN
  Administered 2022-02-11: 80 mg via INTRAVENOUS

## 2022-02-11 MED ORDER — IOHEXOL 350 MG/ML SOLN
75.0000 mL | Freq: Once | INTRAVENOUS | Status: AC | PRN
  Administered 2022-02-11: 75 mL via INTRAVENOUS

## 2022-02-11 MED ORDER — SENNOSIDES-DOCUSATE SODIUM 8.6-50 MG PO TABS: 1.0000 | ORAL_TABLET | Freq: Every evening | ORAL | Status: DC | PRN

## 2022-02-11 MED ORDER — IOHEXOL 300 MG/ML  SOLN
100.0000 mL | Freq: Once | INTRAMUSCULAR | Status: AC | PRN
  Administered 2022-02-11: 75 mL via INTRA_ARTERIAL

## 2022-02-11 MED ORDER — FENTANYL 2500MCG IN NS 250ML (10MCG/ML) PREMIX INFUSION
50.0000 ug/h | INTRAVENOUS | Status: DC
  Administered 2022-02-11: 50 ug/h via INTRAVENOUS
  Filled 2022-02-11: qty 250

## 2022-02-11 MED ORDER — TENECTEPLASE FOR STROKE
PACK | INTRAVENOUS | Status: AC
  Administered 2022-02-11: 25 mg via INTRAVENOUS
  Filled 2022-02-11: qty 10

## 2022-02-11 MED ORDER — NALOXONE HCL 0.4 MG/ML IJ SOLN
INTRAMUSCULAR | Status: AC
  Filled 2022-02-11: qty 1

## 2022-02-11 MED ORDER — ORAL CARE MOUTH RINSE: 15.0000 mL | Freq: Two times a day (BID) | OROMUCOSAL | Status: DC

## 2022-02-11 MED ORDER — FENTANYL CITRATE PF 50 MCG/ML IJ SOSY
50.0000 ug | PREFILLED_SYRINGE | Freq: Once | INTRAMUSCULAR | Status: AC
  Administered 2022-02-11: 50 ug via INTRAVENOUS

## 2022-02-11 MED ORDER — CLEVIDIPINE BUTYRATE 0.5 MG/ML IV EMUL
0.0000 mg/h | INTRAVENOUS | Status: DC
  Administered 2022-02-11: 2 mg/h via INTRAVENOUS
  Administered 2022-02-11: 4 mg/h via INTRAVENOUS
  Filled 2022-02-11 (×3): qty 50

## 2022-02-11 MED ORDER — ONDANSETRON HCL 4 MG/2ML IJ SOLN
INTRAMUSCULAR | Status: DC | PRN
  Administered 2022-02-11: 4 mg via INTRAVENOUS

## 2022-02-11 MED ORDER — IOHEXOL 300 MG/ML  SOLN
100.0000 mL | Freq: Once | INTRAMUSCULAR | Status: AC | PRN
  Administered 2022-02-11: 40 mL via INTRA_ARTERIAL

## 2022-02-11 MED ORDER — ACETAMINOPHEN 160 MG/5ML PO SOLN
650.0000 mg | ORAL | Status: DC | PRN
Start: 2022-02-11 — End: 2022-02-18
  Administered 2022-02-12 – 2022-02-13 (×2): 650 mg
  Filled 2022-02-11 (×2): qty 20.3

## 2022-02-11 MED ORDER — SUCCINYLCHOLINE CHLORIDE 200 MG/10ML IV SOSY
PREFILLED_SYRINGE | INTRAVENOUS | Status: DC | PRN
  Administered 2022-02-11: 200 mg via INTRAVENOUS

## 2022-02-11 MED ORDER — PROPOFOL 10 MG/ML IV BOLUS
INTRAVENOUS | Status: DC | PRN
  Administered 2022-02-11: 150 mg via INTRAVENOUS

## 2022-02-11 MED ORDER — ASPIRIN 81 MG PO CHEW
81.0000 mg | CHEWABLE_TABLET | Freq: Every day | ORAL | Status: DC
  Filled 2022-02-11 (×2): qty 1

## 2022-02-11 MED ORDER — CHLORHEXIDINE GLUCONATE CLOTH 2 % EX PADS
6.0000 | MEDICATED_PAD | Freq: Every day | CUTANEOUS | Status: DC
  Administered 2022-02-11 – 2022-02-16 (×6): 6 via TOPICAL

## 2022-02-11 MED ORDER — PHENYLEPHRINE 40 MCG/ML (10ML) SYRINGE FOR IV PUSH (FOR BLOOD PRESSURE SUPPORT)
PREFILLED_SYRINGE | INTRAVENOUS | Status: DC | PRN
  Administered 2022-02-11: 120 ug via INTRAVENOUS
  Administered 2022-02-11: 200 ug via INTRAVENOUS

## 2022-02-11 MED ORDER — CLEVIDIPINE BUTYRATE 0.5 MG/ML IV EMUL: 0.0000 mg/h | INTRAVENOUS | Status: DC

## 2022-02-11 MED ORDER — IOHEXOL 300 MG/ML  SOLN
100.0000 mL | Freq: Once | INTRAMUSCULAR | Status: AC | PRN
  Administered 2022-02-11: 80 mL via INTRA_ARTERIAL

## 2022-02-11 MED ORDER — STROKE: EARLY STAGES OF RECOVERY BOOK
Freq: Once | Status: AC
  Filled 2022-02-11 (×2): qty 1

## 2022-02-11 MED ORDER — ORAL CARE MOUTH RINSE
15.0000 mL | OROMUCOSAL | Status: DC
  Administered 2022-02-12 (×8): 15 mL via OROMUCOSAL

## 2022-02-11 MED ORDER — ACETAMINOPHEN 160 MG/5ML PO SOLN
650.0000 mg | ORAL | Status: DC | PRN
Start: 2022-02-11 — End: 2022-02-11

## 2022-02-11 MED ORDER — PHENYLEPHRINE HCL-NACL 20-0.9 MG/250ML-% IV SOLN
INTRAVENOUS | Status: DC | PRN
  Administered 2022-02-11: 30 ug/min via INTRAVENOUS

## 2022-02-11 MED ORDER — FENTANYL CITRATE (PF) 100 MCG/2ML IJ SOLN
INTRAMUSCULAR | Status: DC | PRN
  Administered 2022-02-11: 25 ug via INTRAVENOUS
  Administered 2022-02-11: 50 ug via INTRAVENOUS
  Administered 2022-02-11: 25 ug via INTRAVENOUS

## 2022-02-11 NOTE — ED Notes (Signed)
[facility] arrived and at bedside. Reviewed initial NIH and last modified score.  ?

## 2022-02-11 NOTE — Code Documentation (Signed)
Stroke Response Nurse Documentation ?Code Documentation ? ?MILAS SCHAPPELL is a 61 y.o. male arriving to [facility]  via [facility] as a transfer from [place] on 02/11/22. Patient received TNK at 0721.  ? ?Patient met at the bridge 0823 arrival time. 0826 arrival to IR suite. NIH 10 at this time.  ? ?Bedside handoff with IR RN Marylyn Ishihara.  ? ?Newman Nickels  ?Stroke Response RN ? ?

## 2022-02-11 NOTE — Consult Note (Signed)
? ?NAME:  Gordon Fisher, MRN:  [id number], DOB:  Mar 08, 1961, LOS: 0 ?ADMISSION DATE:  02/11/2022 CONSULTATION DATE:  02/11/2022 ?REFERRING MD:  Stroke - EDP CHIEF COMPLAINT:  L MCA occlusion  ? ?History of Present Illness:  ?61 year old man who presented to [facility] ED via EMS 4/13 for R-sided neurologic deficits (R-sided weakness, R facial droop and slurred speech). No significant PMHx. ? ?Patient initially presented to [facility] ED via EMS after waking up with right-sided weakness and slurred speech.  He was evaluated at [facility] by Telestroke and CT Head was obtained demonstrating hyperdense L MCA.  Subsequent CTA demonstrated L MCA M1 occlusion.  TNK was administered at 0721. Patient was transferred to [facility] for NIR thrombectomy. ? ?Initial NIR procedure demonstrated occlusion of the distal left M1 MCA; mechanical thrombectomy was completed with TICI 3 recanalization. Post-procedure CT and MRI Brain were completed. Several hours postprocedure, patient experienced recurrence of neurologic deficits prompting return to NIR; procedure demonstrated distal L M1 occlusion prompting repeat thrombectomy and stent placement x 2. Stent unfortunately became occluded and was unable to be crossed. Patient returned to ICU with cangrelor gtt. ? ?PCCM consulted for vent/BP management. ? ?Pertinent Medical History:  ?History reviewed. No pertinent past medical history. ? ?Significant Hospital Events: ?Including procedures, antibiotic start and stop dates in addition to other pertinent events   ?4/13 - Presented to [facility] via EMS with R-sided neurologic deficits. Evaluated by Telestroke, CT Head with hyperdense L MCA (ASPECTS 10). CTA L MCA M1 occlusion. Received TNK [id number]. NIR thrombectomy with TICI 3 revascularization.  ? ?Interim History / Subjective:  ?PCCM consulted for post-procedure ventilator/BP management ? ?Objective:  ?Blood pressure 107/72, pulse 85, temperature 98.3 ?F (36.8 ?C), temperature source Axillary, resp. rate 17, weight 107  kg, SpO2 99 %. ?   ?FiO2 (%):  [60 %] 60 %  ? ?Intake/Output Summary (Last 24 hours) at 02/11/2022 2053 ?Last data filed at 02/11/2022 1935 ?Gross per 24 hour  ?Intake 1864.95 ml  ?Output 1375 ml  ?Net 489.95 ml  ? ?Filed Weights  ? 02/11/22 8127  ?Weight: 107 kg  ? ?Physical Examination: ?General: Acutely ill-appearing middle-aged man in NAD. Intubated, mildly agitated. ?HEENT: [place]/AT, anicteric sclera, PERRL, moist mucous membranes. ?Neuro: Sedated. Mildly agitated with stimulation. Withdraws to pain briskly in LUE/LLE, with shoulder in RUE, flicker only on RLE. Not following commands. Unilateral R-sided neglect noted. +Corneal, +Cough, and +Gag  ?CV: RRR, no m/g/r. ?PULM: Breathing even and unlabored on vent (PEEP 5, FiO2 60%). Lung fields coarse in upper fields bilaterally. ?GI: Soft, nontender, nondistended. Normoactive bowel sounds. ?Extremities: No LE edema noted. ?Skin: Warm/dry, no rashes. ? ?Resolved Hospital Problem List:  ? ? ?Assessment & Plan:  ?Left MCA M1 occlusion ?S/p NIR thrombectomy x 2 with reocclusion ?- Management per Stroke team (Primary) ?- Cangrelor gtt per IR until repeat CT Head ?- Transition to ASA/Brilinta per NIR recs ?- Goal SBP 120-140 ?- Cleviprex titrated to goal SBP ?- F/u Echo ?- Neuroprotective measures: HOB > 30 degrees, normoglycemia, normothermia, electrolytes WNL ? ?Need for post-procedural mechanical ventilation ?- Continue full vent support (4-8cc/kg IBW) ?- Wean FiO2 for O2 sat > 90% ?- Daily WUA/SBT ?- VAP bundle ?- Pulmonary hygiene ?- PAD protocol for sedation: Propofol and Fentanyl for goal RASS 0 to -1 ? ?Best Practice: (right click and "Reselect all SmartList Selections" daily)  ? ?Diet/type: NPO w/ meds via tube ?DVT prophylaxis: Per Stroke team (Primary) ?GI prophylaxis: PPI ?Lines: N/A ?Foley:  Yes, and  it is still needed ?Code Status:  full code ?Last date of multidisciplinary goals of care discussion [Pending] ? ?Labs:  ?CBC: ?Recent Labs  ?Lab 02/11/22 ?0654   ?WBC 7.8  ?NEUTROABS 2.8  ?HGB 16.4  ?HCT 48.8  ?MCV 91.6  ?PLT 181  ? ?Basic Metabolic Panel: ?Recent Labs  ?Lab 02/11/22 ?0654  ?NA 136  ?K 3.8  ?CL 107  ?CO2 21*  ?GLUCOSE 161*  ?BUN 20  ?CREATININE 1.20  ?CALCIUM 8.8*  ? ?GFR: ?CrCl cannot be calculated (Unknown ideal weight.). ?Recent Labs  ?Lab 02/11/22 ?0654  ?WBC 7.8  ? ?Liver Function Tests: ?Recent Labs  ?Lab 02/11/22 ?0654  ?AST 23  ?ALT 27  ?ALKPHOS 68  ?BILITOT 0.7  ?PROT 7.2  ?ALBUMIN 4.1  ? ?No results for input(s): LIPASE, AMYLASE in the last 168 hours. ?No results for input(s): AMMONIA in the last 168 hours. ? ?ABG: ?No results found for: PHART, PCO2ART, PO2ART, HCO3, TCO2, ACIDBASEDEF, O2SAT  ? ?Coagulation Profile: ?Recent Labs  ?Lab 02/11/22 ?0654  ?INR 1.0  ? ?Cardiac Enzymes: ?No results for input(s): CKTOTAL, CKMB, CKMBINDEX, TROPONINI in the last 168 hours. ? ?HbA1C: ?Hgb A1c MFr Bld  ?Date/Time Value Ref Range Status  ?02/11/2022 06:54 AM 6.0 (H) 4.8 - 5.6 % Final  ?  Comment:  ?  (NOTE) ?Pre diabetes:          5.7%-6.4% ? ?Diabetes:              >6.4% ? ?Glycemic control for   <7.0% ?adults with diabetes ?  ? ?CBG: ?Recent Labs  ?Lab 02/11/22 ?0716  ?GLUCAP 172*  ? ?Review of Systems:   ?Patient is encephalopathic and/or intubated. Therefore history has been obtained from chart review.  ? ?Past Medical History:  ?He,  has no past medical history on file.  ? ?Surgical History:  ? ?Past Surgical History:  ?Procedure Laterality Date  ? IR CT HEAD LTD  02/11/2022  ? IR PERCUTANEOUS ART THROMBECTOMY/INFUSION INTRACRANIAL INC DIAG ANGIO  02/11/2022  ? IR US GUIDE VASC ACCESS RIGHT  02/11/2022  ? ?Social History:  ?   ?Family History:  ?His family history is not on file.  ? ?Allergies: ?No Known Allergies  ? ?Home Medications: ?Prior to Admission medications   ?Medication Sig Start Date End Date Taking? Authorizing Provider  ?Calcium Carbonate Antacid (TUMS PO) Take 1-2 tablets by mouth daily as needed (acid reflux/indigestion).   Yes [provider]  ? ?Critical care time: 35 minutes  ? ?Tim Lair, PA-C ?[facility] ?02/11/22 8:53 PM ? ?Please see [email or web address] for pager details. ? ?From 7A-7P if no response, please call [phone] ?After hours, please call [facility] [phone] ?

## 2022-02-11 NOTE — Progress Notes (Signed)
Patient unable to void. Bladder scan revealed .  Tollie Eth MD notified, orders to place foley at this time vs I/O cath post TNK. ?

## 2022-02-11 NOTE — ED Notes (Signed)
Pt arrived back from CT

## 2022-02-11 NOTE — Progress Notes (Signed)
Code stroke ? ?Call time  632 am  Ems in route ?Beeper   none ?Start   655 ?End   658 ?Soc   658 ?Rad   700 ?Epic   658 ?

## 2022-02-11 NOTE — Anesthesia Postprocedure Evaluation (Signed)
Anesthesia Post Note ? ?Patient: TAIVEN GREENLEY ? ?Procedure(s) Performed: IR WITH ANESTHESIA ? ?  ? ?Patient location during evaluation: SICU ?Anesthesia Type: General ?Level of consciousness: sedated ?Pain management: pain level controlled ?Vital Signs Assessment: post-procedure vital signs reviewed and stable ?Respiratory status: patient remains intubated per anesthesia plan ?Cardiovascular status: stable ?Postop Assessment: no apparent nausea or vomiting ?Anesthetic complications: no ? ? ?No notable events documented. ? ?Last Vitals:  ?Vitals:  ? 02/11/22 2015 02/11/22 2030  ?BP: 110/80 107/72  ?Pulse: 88 85  ?Resp: 16 17  ?Temp:    ?SpO2: 97% 99%  ?  ?Last Pain:  ?Vitals:  ? 02/11/22 1600  ?TempSrc: Axillary  ?PainSc:   ? ? ?  ?  ?  ?  ?  ?  ? ?Jazzmyne Rasnick,W. EDMOND ? ? ? ? ?

## 2022-02-11 NOTE — Progress Notes (Signed)
1700 Assessment patient had acute onset of worsening facial droop, perseverating words, and right upper and lower extremities flaccid. Spoke with MD Marin Roberts on the phone about change in neuro status. Discussed the need for narcan. Patient alert and arousable to voice. MD Marin Roberts and NP Elmer Picker to bedside. Patient was transported by this RN to IR suite. Wife at bedside informed of plan.  ?

## 2022-02-11 NOTE — ED Notes (Signed)
Pt leaving building at this time with [facility]. Wife provided with with directions to [facility] and educated on what to expect  ?

## 2022-02-11 NOTE — Progress Notes (Signed)
Brief Neuro Update: ? ?Was notified of change in assessment by RN.  ? ?Earlier MRI Brain showed small areas of acute infarct in the left MCA territory involving left caudate, putamen, insula, and left frontal and parietal lobe. Mild hemorrhagic transformation in the left parietal lobe compatible with Heidelberg class 1a hemorrhage. No mass-effect or midline shift. There is minimal restricted diffusion in this area on DWI. Also noted to have susceptibility in the left MCA bifurcation compatible with residual vascular thrombus. ? 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 Given the noted thrombus in the L MCA bifurcation noted on the earlier MRI, felt to be reocclusion of the L MCA. Case discussed with Dr. Tommie Sams and felt that repeat Cerebral angiogram and possible need for rethrombectomy and stenting. Worsenign ICH felt to be unlikely given he is far out from [facility] administration and earlier MRI showed mild hemorrhagic transformation. ? ?Dr. Tommie Sams spoke to patient's wife Ms. Jetty Peeks and discussed details of procedure along with risks and benefits and wife agreed to thrombectomy. ? ? ?This patient is critically ill and at significant risk of neurological worsening, death and care requires constant monitoring of vital signs, hemodynamics,respiratory and cardiac monitoring, neurological assessment, discussion with family, other specialists and medical decision making of high complexity. I spent 35 minutes of neurocritical care time  in the care of  this patient. This was time spent independent of any time provided by nurse practitioner or PA. ? ?Erick Blinks ?[facility] ?Pager Number [phone] ?02/11/2022  5:47 PM ? ?Erick Blinks ?[facility] ?Pager Number [phone] ? ? ?

## 2022-02-11 NOTE — Anesthesia Preprocedure Evaluation (Addendum)
Anesthesia Evaluation  ?Patient identified by MRN, date of birth, ID band ?Patient awake ? ? ? ?Reviewed: ?Allergy & Precautions, NPO status , Patient's Chart, lab work & pertinent test results ? ?Airway ? ? ?TM Distance: >3 FB ?Neck ROM: Full ? ? ?Comment: Unable to assess 2/2 pt cooperation Dental ? ? ?Unable to assess 2/2 pt cooperation:   ?Pulmonary ?neg pulmonary ROS,  ?  ?Pulmonary exam normal ?breath sounds clear to auscultation ? ? ? ? ? ? Cardiovascular ?negative cardio ROS ?Normal cardiovascular exam ?Rhythm:Regular Rate:Normal ? ? ?  ?Neuro/Psych ?CVA, Residual Symptoms negative psych ROS  ? GI/Hepatic ?negative GI ROS, Neg liver ROS,   ?Endo/Other  ?negative endocrine ROS ? Renal/GU ?negative Renal ROS  ?negative genitourinary ?  ?Musculoskeletal ?negative musculoskeletal ROS ?(+)  ? Abdominal ?  ?Peds ? Hematology ?negative hematology ROS ?(+)   ?Anesthesia Other Findings ?Code stroke this AM. Assessed at 11am and found to have worsening facial droop and slurred speech. MRI with evidence of acute clot. Can follow commands but no intelligible speech. No movement right side.  ? Reproductive/Obstetrics ? ?  ? ? ? ? ? ? ? ? ? ? ? ? ? ?  ?  ? ? ? ? ? ? ? ?Anesthesia Physical ?Anesthesia Plan ? ?ASA: 3 and emergent ? ?Anesthesia Plan: General  ? ?Post-op Pain Management:   ? ?Induction: Intravenous and Rapid sequence ? ?PONV Risk Score and Plan: 2 and Midazolam, Dexamethasone and Ondansetron ? ?Airway Management Planned: Oral ETT ? ?Additional Equipment:  ? ?Intra-op Plan:  ? ?Post-operative Plan: Extubation in OR and Possible Post-op intubation/ventilation ? ?Informed Consent: I have reviewed the patients History and Physical, chart, labs and discussed the procedure including the risks, benefits and alternatives for the proposed anesthesia with the patient or authorized representative who has indicated his/her understanding and acceptance.  ? ? ? ?Dental advisory  given ? ?Plan Discussed with: CRNA ? ?Anesthesia Plan Comments:   ? ? ? ? ? ? ?Anesthesia Quick Evaluation ? ?

## 2022-02-11 NOTE — H&P (Addendum)
NEUROLOGY CONSULTATION NOTE  ? ?Date of service: February 11, 2022 ?Patient Name: Gordon Fisher ?MRN:  [id number] ?DOB:  February 14, 1961 ? ?_ _ _   _ __   _ __ _ _  __ __   _ __   __ _ ? ?History of Present Illness  ?Gordon Fisher is a 61 y.o. male with no significant PMH who presented to [facility] with R sided weakness, R facial droop, slurred speech. He got up at [place] on 02/11/22 and walked to the bathroom by himself, used the bathroom and went back to sleep. He woke up about 30 mins later with R sided weakness and slurred speech. ? ?He was brought in to [facility] by EMS for further evaluation. Was evaluated by Telestroke and CTH w/o contrast with hyperdense L MCA with ASPECTS of 10. He was given tNKASE at 0721. CTA with L MCA M1 occlusion. ? ?LKW: [id number] on 02/11/22 ?mRS: 0 ?tNKASE: yes given by teleneuro at 0721. ?Thrombectomy: yes, case was discussed over a phone call with me, Neuro IR, Teleneurology and ED team. Neuro IR discussed details of procedure along with risks and benefits with patient's wife Gordon Fisher. She consented to the procedure and patient was transferred to [facility] for thrombectomy. ?NIHSS components Score: Comment  ?1a Level of Conscious 0[x]  1[]  2[]  3[]      ?1b LOC Questions 0[x]  1[]  2[]       ?1c LOC Commands 0[x]  1[]  2[]       ?2 Best Gaze 0[x]  1[]  2[]       ?3 Visual 0[x]  1[]  2[]  3[]      ?4 Facial Palsy 0[]  1[]  2[x]  3[]      ?5a Motor Arm - left 0[x]  1[]  2[]  3[]  4[]  UN[]    ?5b Motor Arm - Right 0[]  1[]  2[]  3[]  4[x]  UN[]    ?6a Motor Leg - Left 0[x]  1[]  2[]  3[]  4[]  UN[]    ?6b Motor Leg - Right 0[]  1[x]  2[]  3[]  4[]  UN[]    ?7 Limb Ataxia 0[x]  1[]  2[]  3[]  UN[]     ?8 Sensory 0[]  1[x]  2[]  UN[]      ?9 Best Language 0[]  1[x]  2[]  3[]      ?10 Dysarthria 0[]  1[x]  2[]  UN[]      ?11 Extinct. and Inattention 0[x]  1[]  2[]       ?TOTAL: 10   ? ?  ?ROS  ? ?Unable to obtain given the acuity of the situation ? ?Past History  ?History reviewed. No pertinent past medical history. ?History reviewed.  No pertinent surgical history. ?History reviewed. No pertinent family history. ?Social History  ? ?Socioeconomic History  ?? Marital status: Single  ?  Spouse name: Not on file  ?? Number of children: Not on file  ?? Years of education: Not on file  ?? Highest education level: Not on file  ?Occupational History  ?? Not on file  ?Tobacco Use  ?? Smoking status: Not on file  ?? Smokeless tobacco: Not on file  ?Substance and Sexual Activity  ?? Alcohol use: Not on file  ?? Drug use: Not on file  ?? Sexual activity: Not on file  ?Other Topics Concern  ?? Not on file  ?Social History Narrative  ?? Not on file  ? ?Social Determinants of Health  ? ?Financial Resource Strain: Not on file  ?Food Insecurity: Not on file  ?Transportation Needs: Not on file  ?Physical Activity: Not on file  ?Stress: Not on file  ?Social Connections: Not on file  ? ?Not on File ? ?  Medications  ? ?No medications prior to admission.  ?  ? ?Vitals  ? ?Vitals:  ? 02/11/22 0712 02/11/22 0730 02/11/22 0740 02/11/22 0745  ?BP:  (!) 142/86  (!) 151/83  ?Pulse:  81  93  ?Resp:  13  19  ?Temp:   99 ?F (37.2 ?C)   ?TempSrc:   Oral   ?SpO2:  96%  97%  ?Weight: 107 kg     ?  ? ?There is no height or weight on file to calculate BMI. ? ?Physical Exam  ? ?General: Laying comfortably in bed; in no acute distress.  ?HENT: Normal oropharynx and mucosa. Normal external appearance of ears and nose.  ?Neck: Supple, no pain or tenderness  ?CV: No JVD. No peripheral edema.  ?Pulmonary: Symmetric Chest rise. Normal respiratory effort.  ?Abdomen: Soft to touch, non-tender.  ?Ext: No cyanosis, edema, or deformity  ?Skin: No rash. Normal palpation of skin.   ?Musculoskeletal: Normal digits and nails by inspection. No clubbing.  ? ?Neurologic Examination  ?Mental status/Cognition: Alert, oriented to self, place, month and year, good attention.  ?Speech/language: dysarthric speech, fluent and makes a few paraphasic errors when trying to speak in full sentences, comprehension  intact, object naming intact. ?Cranial nerves:  ? CN II Pupils equal and reactive to light, no VF deficits  ? CN III,IV,VI EOM intact, no gaze preference or deviation, no nystagmus   ? CN V normal sensation in V1, V2, and V3 segments bilaterally  ? CN VII R facial droop  ? CN VIII normal hearing to speech  ? CN IX & X normal palatal elevation, no uvular deviation  ? CN XI 5/5 head turn and 5/5 shoulder shrug bilaterally  ? CN XII midline tongue protrusion  ? ?Motor:  ?Muscle bulk: normal, tone flaccid in RUE ?Mvmt Root Nerve  Muscle Right Left Comments  ?SA C5/6 Ax Deltoid     ?EF C5/6 Mc Biceps 0 5   ?EE C6/7/8 Rad Triceps 0 5   ?WF C6/7 Med FCR     ?WE C7/8 PIN ECU     ?F Ab C8/T1 U ADM/FDI 0 5   ?HF L1/2/3 Fem Illopsoas 4- 5   ?KE L2/3/4 Fem Quad     ?DF L4/5 D Peron Tib Ant 4 5   ?PF S1/2 Tibial Grc/Sol 4 5   ? ?Sensation: ? Light touch Decreased in RUE  ? Pin prick   ? Temperature   ? Vibration   ?Proprioception   ? ?Coordination/Complex Motor:  ?- Finger to Nose intact in LUE ?- Heel to shin unable to assess. ?- Rapid alternating movement slowed ?- Gait: deferred for patient safety. ? ?Labs  ? ?CBC:  ?Recent Labs  ?Lab 02/11/22 ?0654  ?WBC 7.8  ?NEUTROABS 2.8  ?HGB 16.4  ?HCT 48.8  ?MCV 91.6  ?PLT 181  ? ? ?Basic Metabolic Panel:  ?Lab Results  ?Component Value Date  ? NA 136 02/11/2022  ? K 3.8 02/11/2022  ? CO2 21 (L) 02/11/2022  ? GLUCOSE 161 (H) 02/11/2022  ? BUN 20 02/11/2022  ? CREATININE 1.20 02/11/2022  ? CALCIUM 8.8 (L) 02/11/2022  ? GFRNONAA >60 02/11/2022  ? ?Lipid Panel: No results found for: [place] ?HgbA1c: No results found for: HGBA1C ?Urine Drug Screen: No results found for: LABOPIA, COCAINSCRNUR, [place], [place], THCU, LABBARB  ?Alcohol Level  ?   ?Component Value Date/Time  ? ETH <10 02/11/2022 0654  ? ? ?CT Head without contrast(Personally reviewed): ?Hyperdense L MCA but no ICH.  ASPECTS of 9 ? ?CT angio Head and Neck with contrast(Personally reviewed): ?L MCA M1 occlusion ? ?MRI  Brain: ?pending ? ?Impression  ? ?ZENAIDO GYURE is a 61 y.o. male with no significant PMH who presents with R sided weakness, R facial droop, dysarthric speech, R sided numbness, midl aphasia. Found to have L MCA stroke with L M1 occlusion. ? ?He is s/p tNKASE and thrombectomy. ? ?Primary Diagnosis:  ?Cerebral infarction due to thrombosis of left middle cerebral artery.  ? ?Recommendations  ? ?Acute L MCA stroke s/p tNKASE and thrombectomy: ?- Frequent NeuroChecks for post tNK care per Neuro ICU protocol. ?- Initial CTH demonstrated no acute hemorrhage or mass ?- s/p tNKASE at [facility] ED at [phone] ?- MRI Brain - pending. ?- CTA - L MCA M1 occlusion ?- TTE - pending ?- Lipid Panel: LDL - pending ? - Statin: if LDL > 70 ?- HbA1c: pending. ?- Antithrombotic: Start ASA 81 mg daily if 24 h CTH does not show acute hemorrhage ?- DVT prophylaxis: SCDs. Pharmacologic prophylaxis if 24 h CTH does not demonstrate acute hemorrhage ?- Systolic Blood Pressure goal: per Neuro IR for the first 24 hours after intervention but has to be < 180 mm Hg ?- Telemetry monitoring for arrhythmia: 72 hours ?- Swallow screen - ordered ?- PT/OT/SLP consults ? ?Obesity: ?- will need to counsel on the importance of weight loss to reduce risk of stroke. ? ?This patient is critically ill and at significant risk of neurological worsening, death and care requires constant monitoring of vital signs, hemodynamics,respiratory and cardiac monitoring, neurological assessment, discussion with family, other specialists and medical decision making of high complexity. I spent 40 minutes of neurocritical care time  in the care of  this patient. This was time spent independent of any time provided by nurse practitioner or PA. ? ?Donnetta Simpers ?[facility] ?Pager Number [phone] ?02/11/2022  10:04 AM ? ? ?______________________________________________________________________ ? ? ?Thank you for the opportunity to take part in the care  of this patient. If you have any further questions, please contact the neurology consultation attending. ? ?Signed, ? ?Donnetta Simpers ?[facility] ?Pager Number [phone] ?_ _ _   _ __   _ __

## 2022-02-11 NOTE — Sedation Documentation (Signed)
Patient transported to [place] ICU with crna staff. Claiborne Billings RN at the bedside to receive patient. Groin site assessed. Clean, dry and intact. Soft to palpation, no hematoma at this time. +1 distal pulses intact.  ?

## 2022-02-11 NOTE — Progress Notes (Signed)
[place]estoke Code Stroke activated at [phone]. Already seen by provider prior to cart activation and in CT dept on cart activation. Dr. Then connected to telestroke cart for code stroke evaluation at [place]. Patient returned to ED at 0709. ?

## 2022-02-11 NOTE — Progress Notes (Signed)
1100: Upon 1100 assessment, patient was noted to have an increased right facial droop, worsening slurred speech + aphasia, and decreased sensation on the right upper and lower extremities. Called and paged MD Lorrin Goodell with neurology to inform on change. Stat CT head ordered. Patient taken to CT and back with no issues. ? ?[id number]: Spoke to MD [name] about patient's SBP being intermittently soft 114-117.  MD [name] verbalized as long as SBP is close to 120, it's ok.  [name] and Lorrin Goodell aware that arterial line is increasingly not working and was given the okay to d/c a-line.  ?

## 2022-02-11 NOTE — ED Notes (Signed)
EMS called CODE STROKE called and beeped out @ 0635 CT notified. ?

## 2022-02-11 NOTE — Transfer of Care (Signed)
Immediate Anesthesia Transfer of Care Note ? ?Patient: Gordon Fisher ? ?Procedure(s) Performed: RADIOLOGY WITH ANESTHESIA ? ?Patient Location: ICU ? ?Anesthesia Type:General ? ?Level of Consciousness: awake, alert , oriented and patient cooperative ? ?Airway & Oxygen Therapy: Patient Spontanous Breathing and Patient connected to face mask oxygen ? ?Post-op Assessment: Report given to RN, Post -op Vital signs reviewed and stable and Patient moving all extremities X 4 ? ?Post vital signs: Reviewed and stable ? ?Last Vitals:  ?Vitals Value Taken Time  ?BP 142/82 02/11/22 0955  ?Temp    ?Pulse 102 02/11/22 0958  ?Resp 17 02/11/22 0958  ?SpO2 92 % 02/11/22 0958  ?Vitals shown include unvalidated device data. ? ?Last Pain:  ?Vitals:  ? 02/11/22 0740  ?TempSrc: Oral  ?PainSc:   ?   ? ?  ? ?Complications: No notable events documented. ?

## 2022-02-11 NOTE — Sedation Documentation (Signed)
Bed control notified of active code stroke on the table ?

## 2022-02-11 NOTE — Procedures (Signed)
INTERVENTIONAL NEURORADIOLOGY BRIEF POSTPROCEDURE NOTE ? ?MECHANICAL THROMBECTOMY AND INTRACRANIAL STENTING ? ?Attending: Dr. Baldemar Lenis ? ?Diagnosis: Left MCA re occlusion. ? ?Access site: RCFA ? ?Access closure: Perclose Prostyle ? ?Anesthesia: GETA ? ?Medication used: cangrelor IV bolus and drip. ? ?Complications: Stent occlusion. ? ?Estimated blood loss: 30 mL. ? ?Specimen: None. ? ?Findings: Distal left M1 re occlusion. Mechanical thrombectomy performed with direct contact aspiration with recanalization of the M1 and M2 superior division. Microinjection into the posterior division show evidence of dissection. Stent deployed into the posterior division with recanalization of the angular branch. Parietal branch remained occluded. A second stent was deployed to the parietal branch, partially telescoping the first stent. Angiogram post stent deployment showed complete left MCA recanalization. Flat panale CT showed no hemorrhagic complication. ? ?Delay follow up angiogram showed stent construct occlusion. Attempt to cross the stent proved unsuccessful. Cangrelor dose increased and aspiration at the proximal aspect of the stent proved unsuccessful. Anterior division and anterior temporal artery remained patent. ? ?PLAN: ?- Return to ICU ?- Cangrelor drip to continue until head CT performed at 10:30 pm is cleared for bleed. At this point, pt will need to be loaded with ASA 81 mg and brilinta 180 mg bid.  ?- Bed rest x 6 hours post femoral puncture ? ?  ?

## 2022-02-11 NOTE — Progress Notes (Signed)
Attempted echo, but patient may have to go to CT at this time per nurse. ?

## 2022-02-11 NOTE — ED Provider Notes (Signed)
?[facility] ?Provider Note ? ? ?CSN: [id number] ?Arrival date & time: 02/11/22  [id number] ? ?  ? ?History ? ?Chief Complaint  ?Patient presents with  ? Code Stroke  ? ? ?Gordon Fisher is a 61 y.o. male. ? ?The history is provided by the patient and the EMS personnel.  ?He has no significant past history and was last known normal at 0515 today and presents by ambulance with difficulty speaking, right-sided facial droop, dense right hemiparesis.  He denies any headache.  He denies anticoagulant use.  He denies history of hypertension or diabetes. ?  ?Home Medications ?Prior to Admission medications   ?Not on File  ?   ? ?Allergies    ?Patient has no allergy information on record.   ? ?Review of Systems   ?Review of Systems  ?All other systems reviewed and are negative. ? ?Physical Exam ?Updated Vital Signs ?BP (!) 159/93   Pulse 96   Resp 16   Wt 107 kg   SpO2 97%  ?Physical Exam ?Vitals and nursing note reviewed.  ?61 year old male, resting comfortably and in no acute distress. Vital signs are significant for mildly elevated blood pressure. Oxygen saturation is 97%, which is normal. ?Head is normocephalic and atraumatic. PERRLA, EOMI. Oropharynx is clear. ?Neck is nontender and supple without adenopathy or JVD.  There are no carotid bruits. ?Back is nontender and there is no CVA tenderness. ?Lungs are clear without rales, wheezes, or rhonchi. ?Chest is nontender. ?Heart has regular rate and rhythm without murmur. ?Abdomen is soft, flat, nontender. ?Extremities have no cyanosis or edema, full range of motion is present. ?Skin is warm and dry without rash. ?Neurologic: Awake and alert, speech is dysarthric but fluent.  Moderately severe right central facial droop is present, tongue protrudes in the midline.  There is a dense right hemiparesis with virtually no muscle activity in the right arm and right leg.  Strength in the left arm and left leg are 5/5.  There is a right Babinski response. ? ?ED  Results / Procedures / Treatments   ?Labs ?(all labs ordered are listed, but only abnormal results are displayed) ?Labs Reviewed  ?COMPREHENSIVE METABOLIC PANEL - Abnormal; Notable for the following components:  ?    Result Value  ? CO2 21 (*)   ? Glucose, Bld 161 (*)   ? Calcium 8.8 (*)   ? All other components within normal limits  ?CBG MONITORING, ED - Abnormal; Notable for the following components:  ? Glucose-Capillary 172 (*)   ? All other components within normal limits  ?RESP PANEL BY RT-PCR (FLU A&B, COVID) ARPGX2  ?ETHANOL  ?CBC  ?DIFFERENTIAL  ?PROTIME-INR  ?APTT  ?RAPID URINE DRUG SCREEN, HOSP PERFORMED  ?URINALYSIS, ROUTINE W REFLEX MICROSCOPIC  ?I-STAT CHEM 8, ED  ? ? ?EKG ?EKG Interpretation ? ?Date/Time:  Thursday February 11 2022 07:11:28 EDT ?Ventricular Rate:  96 ?PR Interval:  162 ?QRS Duration: 91 ?QT Interval:  351 ?QTC Calculation: 444 ?R Axis:   40 ?Text Interpretation: Sinus rhythm Abnormal R-wave progression, early transition Baseline wander in lead(s) V2 No old tracing to compare Confirmed by Delora Fuel ([id number]) on 02/11/2022 7:23:17 AM ? ?Radiology ?CT ANGIO HEAD NECK W WO CM ? ?Result Date: 02/11/2022 ?CLINICAL DATA:  Code stroke. 61 year old male with sudden onset right side weakness. EXAM: CT ANGIOGRAPHY HEAD AND NECK TECHNIQUE: Multidetector CT imaging of the head and neck was performed using the standard protocol during bolus administration of intravenous contrast. Multiplanar  CT image reconstructions and MIPs were obtained to evaluate the vascular anatomy. Carotid stenosis measurements (when applicable) are obtained utilizing NASCET criteria, using the distal internal carotid diameter as the denominator. RADIATION DOSE REDUCTION: This exam was performed according to the departmental dose-optimization program which includes automated exposure control, adjustment of the mA and/or kV according to patient size and/or use of iterative reconstruction technique. CONTRAST:  37mL OMNIPAQUE  IOHEXOL 350 MG/ML SOLN COMPARISON:  Plain head CT 0659 hours. FINDINGS: CTA NECK Skeleton: No acute osseous abnormality identified. Ordinary cervical spine degeneration. Upper chest: Atelectasis and mediastinal lipomatosis. Other neck: Small volume retained secretions in the pharynx. Otherwise negative. Aortic arch: 3 vessel arch configuration. Minimal arch atherosclerosis. Right carotid system: Tortuous brachiocephalic artery without plaque or stenosis. Minor soft and calcified plaque at the right carotid bifurcation without stenosis. Left carotid system: Mild tortuosity and carotid bifurcation plaque with no stenosis. Vertebral arteries: Negative.  Mildly dominant left vertebral artery. CTA HEAD Posterior circulation: Minimal right V4 calcified plaque without stenosis. Dominant left V4 segment. Normal PICA origins and vertebrobasilar junction. Patent basilar artery without stenosis. Patent SCA and PCA origins. Posterior communicating arteries are diminutive or absent. PCA branches are within normal limits. Anterior circulation: Both ICA siphons are patent. Mild to moderate calcified plaque primarily in the supraclinoid segments. Mild supraclinoid stenosis results greater on the left. Patent carotid termini, MCA and ACA origins. Anterior communicating artery and ACA branches are within normal limits. Right MCA M1 segment and bifurcation are patent without stenosis. Right MCA branches are within normal limits. Left MCA origin is patent but the left M1 is occluded about 12 mm distal to the origin (series 7, image 97). The left anterior temporal artery origin is patent proximal to the occlusion and there is good reconstitution of left MCA branches Venous sinuses: Patent. Anatomic variants: Dominant left vertebral artery. Review of the MIP images confirms the above findings IMPRESSION: 1. 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 This was discussed by telephone with Dr. Delora Fuel on [id number] at 0711  hours. Anterior temporal artery remains patent and there is robust MCA branch collateral enhancement. 2. Generally mild for age atherosclerosis, most pronounced in the supraclinoid ICA siphons with calcified plaque. No other significant arterial stenosis. Electronically Signed   By: Genevie Ann M.D.   On: 02/11/2022 07:20  ? ?CT HEAD CODE STROKE WO CONTRAST ? ?Result Date: 02/11/2022 ?CLINICAL DATA:  Code stroke. 61 year old male with sudden onset right side weakness. EXAM: CT HEAD WITHOUT CONTRAST TECHNIQUE: Contiguous axial images were obtained from the base of the skull through the vertex without intravenous contrast. RADIATION DOSE REDUCTION: This exam was performed according to the departmental dose-optimization program which includes automated exposure control, adjustment of the mA and/or kV according to patient size and/or use of iterative reconstruction technique. COMPARISON:  None. FINDINGS: Brain: Hyperdense left MCA (series 5, image 39). But only subtle a cortical and subcortical hypodensity in the left middle frontal gyrus posteriorly. No other CT changes of acute cortically based infarct. No midline shift, ventriculomegaly, mass effect, evidence of mass lesion, intracranial hemorrhage or evidence of cortically based acute infarction. Vascular: Calcified atherosclerosis at the skull base. Skull: Negative. Sinuses/Orbits: Chronic appearing right sphenoid sinus inflammation with mucoperiosteal thickening. Other Visualized paranasal sinuses and mastoids are clear. Other: Visualized orbits and scalp soft tissues are within normal limits. ASPECTS [facility] Stroke Program Early CT Score) - Ganglionic level infarction (caudate, lentiform nuclei, internal capsule, insula, M1-M3 cortex): - Supraganglionic infarction (M4-M6 cortex):  7 Total score (0-10 with 10 being normal): 2 (abnormal M5). IMPRESSION: 1. Hyperdense Left MCA highly suspicious for ELVO. Subtle if any Left MCA infarct changes by CT = ASPECTS 9. 2. No  intracranial hemorrhage or mass effect. 3. Study discussed by telephone with Dr. Delora Fuel on [id number] at 07:10. Stat CTA pending. Electronically Signed   By: Genevie Ann M.D.   On: 02/11/2022 07:12   ? ?Procedures

## 2022-02-11 NOTE — Anesthesia Procedure Notes (Signed)
Procedure Name: Intubation ?Date/Time: 02/11/2022 5:25 PM ?Performed by: Barrington Ellison, CRNA ?Pre-anesthesia Checklist: Patient identified, Emergency Drugs available, Suction available and Patient being monitored ?Patient Re-evaluated:Patient Re-evaluated prior to induction ?Oxygen Delivery Method: Circle system utilized ?Preoxygenation: Pre-oxygenation with 100% oxygen ?Induction Type: IV induction and Rapid sequence ?Laryngoscope Size: Mac and 3 ?Grade View: Grade II ?Tube type: Oral ?Tube size: 7.5 mm ?Number of attempts: 1 ?Airway Equipment and Method: Stylet and Oral airway ?Placement Confirmation: ETT inserted through vocal cords under direct vision, positive ETCO2 and breath sounds checked- equal and bilateral ?Secured at: 23 cm ?Tube secured with: Tape ?Dental Injury: Teeth and Oropharynx as per pre-operative assessment  ? ? ? ? ?

## 2022-02-11 NOTE — ED Provider Notes (Signed)
Discussed with neurology (Dr. Derry Lory), IR, and teleneurology.  Patient will be transferred emergently to [facility] as a code IR.  Wife has verbally consented.  Patient is otherwise maintaining a mildly elevated blood pressure but well within the limits of TNK. [facility] will emergently transfer. He is protecting his airway and otherwise appears stable for transfer upon leaving [facility] ER. ?  ?Pricilla Loveless, MD ?02/11/22 [phone] ? ?

## 2022-02-11 NOTE — ED Triage Notes (Signed)
Pt to ED from home via [facility]. LKW 0515, began with right sided facial droop, progressing to weakness in right arm and leg. On arrival, minimal movement in right leg and right arm flaccid. EDP assess on arrival.  ?

## 2022-02-11 NOTE — Anesthesia Procedure Notes (Signed)
Arterial Line Insertion ?Start/End4/13/2023 8:33 AM, 02/11/2022 8:38 AM ?Performed by: Mellody Dance, MD, anesthesiologist ? Patient location: OR. ?Preanesthetic checklist: patient identified, risks and benefits discussed and surgical consent ?Lidocaine 1% used for infiltration ?Left, radial was placed ?Catheter size: 20 G ?Hand hygiene performed  and Seldinger technique used ? ?Attempts: 1 ?Procedure performed without using ultrasound guided technique. ?Following insertion, dressing applied and Biopatch. ?Post procedure assessment: normal ? ?Patient tolerated the procedure well with no immediate complications. ? ? ?

## 2022-02-11 NOTE — Anesthesia Procedure Notes (Deleted)
Arterial Line Insertion ?Start/End4/13/2023 8:35 AM, 02/11/2022 8:40 AM ?Performed by: Mellody Dance, MD, anesthesiologist ? Preanesthetic checklist: patient identified, IV checked, site marked, risks and benefits discussed, surgical consent, monitors and equipment checked, pre-op evaluation, timeout performed and anesthesia consent ?Left, radial was placed ?Catheter size: 20 G ?Maximum sterile barriers used  ? ?Attempts: 1 ?Procedure performed without using ultrasound guided technique. ?Following insertion, dressing applied and Biopatch. ?Post procedure assessment: normal ? ?Patient tolerated the procedure well with no immediate complications. ? ? ?

## 2022-02-11 NOTE — Anesthesia Preprocedure Evaluation (Addendum)
Anesthesia Evaluation  ?Patient identified by MRN, date of birth, ID band ?Patient awake ? ? ? ?Reviewed: ?Allergy & Precautions, NPO status , Patient's Chart, lab work & pertinent test results ? ?Airway ?Mallampati: II ? ?TM Distance: >3 FB ?Neck ROM: Full ? ? ? Dental ?no notable dental hx. ? ?  ?Pulmonary ?neg pulmonary ROS,  ?  ?Pulmonary exam normal ?breath sounds clear to auscultation ? ? ? ? ? ? Cardiovascular ?Exercise Tolerance: Good ?Normal cardiovascular exam ?Rhythm:Regular Rate:Normal ? ?Sinus rhythm ?Abnormal R-wave progression, early transition ?Baseline wander in lead(s) V2 ?No old tracing to compare ?Confirmed by Delora Fuel ([id number]) on 02/11/2022 7:23:17 AM ?  ?Neuro/Psych ?CVA (acute stroke s/p  IV thrombolytic), Residual Symptoms negative psych ROS  ? GI/Hepatic ?negative GI ROS, Neg liver ROS,   ?Endo/Other  ?negative endocrine ROS ? Renal/GU ?negative Renal ROS  ?negative genitourinary ?  ?Musculoskeletal ?negative musculoskeletal ROS ?(+)  ? Abdominal ?  ?Peds ?negative pediatric ROS ?(+)  Hematology ?negative hematology ROS ?(+)   ?Anesthesia Other Findings ? ? Reproductive/Obstetrics ?negative OB ROS ? ?  ? ? ? ? ? ? ? ? ? ? ? ? ? ?  ?  ? ? ? ? ? ? ? ? ?Anesthesia Physical ?Anesthesia Plan ? ?ASA: 4 and emergent ? ?Anesthesia Plan: General  ? ?Post-op Pain Management:   ? ?Induction: Intravenous ? ?PONV Risk Score and Plan: 2 and Treatment may vary due to age or medical condition, Ondansetron and Dexamethasone ? ?Airway Management Planned: Oral ETT ? ?Additional Equipment: Arterial line ? ?Intra-op Plan:  ? ?Post-operative Plan: Extubation in OR ? ?Informed Consent: I have reviewed the patients History and Physical, chart, labs and discussed the procedure including the risks, benefits and alternatives for the proposed anesthesia with the patient or authorized representative who has indicated his/her understanding and acceptance.  ? ? ? ?Dental advisory  given ? ?Plan Discussed with: CRNA, Anesthesiologist and Surgeon ? ?Anesthesia Plan Comments: (Right sided weakness. )  ? ? ? ? ? ?Anesthesia Quick Evaluation ? ?

## 2022-02-11 NOTE — Procedures (Signed)
INTERVENTIONAL NEURORADIOLOGY BRIEF POSTPROCEDURE NOTE ? ?DIAGNOSTIC CEREBRAL ANGIOGRAM AND MECHANICAL THROMBECTOMY ? ?Attending: Dr. Baldemar Lenis ? ?Diagnosis: Left M1 occlusion. ? ?Access site: RCFA. ? ?Access closure: Perclose prostyle. ? ?Anesthesia: GETA ? ?Medication used: Refer to anesthesia documentation. ? ?Complications: None. ? ?Estimated blood loss: 100 mL ? ?Specimen: None.  ? ?Findings: Occlusion of the distal left M1/MCA. Mechanical thrombectomy performed with direct contact aspiration. Total of 3 passes with complete recanalization (TICI3). ? ?The patient tolerated the procedure well without incident or complication and is in stable condition.  ? ?PLAN: ?- Bed rest x 6 hours post femoral access. ?- SBP 120-140 mmHg. ? ?  ?

## 2022-02-11 NOTE — Anesthesia Postprocedure Evaluation (Signed)
Anesthesia Post Note ? ?Patient: Gordon Fisher ? ?Procedure(s) Performed: RADIOLOGY WITH ANESTHESIA ? ?  ? ?Patient location during evaluation: PACU ?Anesthesia Type: General ?Level of consciousness: awake and alert ?Pain management: pain level controlled ?Vital Signs Assessment: post-procedure vital signs reviewed and stable ?Respiratory status: spontaneous breathing, nonlabored ventilation, respiratory function stable and patient connected to nasal cannula oxygen ?Cardiovascular status: blood pressure returned to baseline and stable ?Postop Assessment: no apparent nausea or vomiting ?Anesthetic complications: no ? ? ?No notable events documented. ? ?Last Vitals:  ?Vitals:  ? 02/11/22 0740 02/11/22 0745  ?BP:  (!) 151/83  ?Pulse:  93  ?Resp:  19  ?Temp: 37.2 ?C   ?SpO2:  97%  ?  ?Last Pain:  ?Vitals:  ? 02/11/22 0740  ?TempSrc: Oral  ?PainSc:   ? ? ?  ?  ?  ?  ?  ?  ? ?Mellody Dance ? ? ? ? ?

## 2022-02-11 NOTE — Consult Note (Signed)
[facility] ?[facility] ? ? ?Patient Name:   Gordon Fisher, Gordon Fisher ?Date of Birth:   May 04, 1961 ?Identification Number:   MRN - [id number] ?Date of Service:   02/11/2022 07:03:05 ? ?Diagnosis: ?      I63.9 - Cerebrovascular accident (CVA), unspecified mechanism ([place]) ? ?Impression: ?     Dx: Right sided weakness. CT showed no hemorrhage or acute pathology. Candidate for IV thrombolytics. CTA showed proximal to mid Lt M1 occlusion with distal flow. Candidate for thrombectomy. Recommend ICU admission post IVT/MT. No antiplatelet/AC. Post thrombolytic protocol. FS goal of 140-180 mg/dl, treat hyperglycemia. Mechanical VTE prophylaxis. Avoid hypotension and hypo/hyperglycemia. BP <180/105 mmHg. ? ?Brain MRI w/o gad, TTE, Lipid panel and HBA1C. PT/OT/Speech eval. ?  ? ?Our recommendations are outlined below. ?Recommendations: ?IV Tenecteplase recommended. ? ?I confirmed the following. ?(Patient name, DOB, MRN, dose of Thrombolytic and waste, weight completed by stretcher/scale not stated weight, have ED staff inform ED MD of thrombolytic decision) ?Thrombolytic bolus given Without Complication. ? ? ?IV Tenecteplase Total Dose - 25.0 mg ? ? ?Routine post Thrombolytic monitoring including neuro checks and blood pressure control during/after treatment Monitor blood pressure Check blood pressure and neuro assessment every 15 min for 2 h, Jairen Goldfarb every 30 min for 6 h, and finally every hour for 16 h. ? ?Manage Blood Pressure per post Thrombolytic protocol. ? ?      Follow designated hospital protocol for admission and post thrombolytic care ?      CT brain 24 hours post Thrombolytic ?      NPO until swallowing screen performed and passed ?      No antiplatelet agents or anticoagulants (including heparin for DVT prophylaxis) in first 24 hours ?      No Foley catheter, nasogastric tube, arterial catheter or central venous catheter for 24 hr, unless absolutely necessary ?      Telemetry ?       Bedside swallow evaluation ?      Euglycemia ?      Avoid hyperthermia, PRN acetaminophen ?      DVT prophylaxis ?      Inpatient Neurology Consultation ?      Stroke evaluation as per inpatient neurology recommendations ? ?Discussed with ED physician ? ? ? ?------------------------------------------------------------------------------ ? ?Advanced Imaging: ?CTA Head and Neck Completed. ? ?LVO:Yes ? ?Discussed with NIR:Yes ? ?Discussed with NIR Time:02/11/2022 07:30:36 ? ?Discussed with NIR Text: ? ?Accepted for intervention ? ? ?Metrics: ?Last Known Well: 02/11/2022 05:15:00 ?[facility] Notification Time: 02/11/2022 07:03:05 ?Arrival Time: 02/11/2022 06:51:00 ?Stamp Time: 02/11/2022 07:03:05 ?Initial Response Time: 02/11/2022 07:05:07 ?Symptoms: right sided weakness. ?NIHSS Start Assessment Time: 02/11/2022 07:09:32 ?Patient is a candidate for Thrombolytic. ?Thrombolytic Medical Decision: 02/11/2022 07:14:50 ?Needle Time: 02/11/2022 07:21:36 ?Weight Noted by Staff: 107 kg ? ?I personally Reviewed the CT Head and it Showed no hemorrhage. ? ?Primary Provider Notified of Diagnostic Impression and Management Plan on: 02/11/2022 07:34:12 ? ? ? ?------------------------------------------------------------------------------ ? ?Thrombolytic Contraindications: ? ?Last Known Well > 4.5 hours: No ?CT Head showing hemorrhage: No ?Ischemic stroke within 3 months: No ?Severe head trauma within 3 months: No ?Intracranial/intraspinal surgery within 3 months: No ?History of intracranial hemorrhage: No ?Symptoms and signs consistent with an SAH: No ?GI malignancy or GI bleed within 21 days: No ?Coagulopathy: Platelets <100 000 /mm3, INR >1.7, aPTT>40 s, or PT >15 s: No ?Treatment dose of LMWH within the previous 24 hrs: No ?Use of NOACs in past 48 hours: No ?Glycoprotein IIb/IIIa receptor inhibitors use: No ?  Symptoms consistent with infective endocarditis: No ?Suspected aortic arch dissection: No ?Intra-axial intracranial  neoplasm: No ? ?Thrombolytic Decision and Management Plan: ?Management with thrombolytic treatment was explained to the Patient as was risks and benefits and alternatives to the treatment. Patient agrees with the decision to proceed with thrombolytic treatment. [name] ?All questions were answered and the Patient expressed understanding of the treatment plan. ? ? ?History of Present Illness: ?Patient is a 61 year old Male. ? ?Patient was brought by EMS for symptoms of right sided weakness. ? ?61 yo Man with no significant PMHx presenting for evaluation of acute onset right sided facial droop, right sided weakness, and dysarthria. Last known well time is 515 AM today. Patient woke up in his usual state of health at 5:15 AM. He went to the bathroom without difficulties and went back to bed. He woke up again approximately at 5:45-6:00 AM with right sided hemiparesis. EMS called by for further evaluation. In route, developed right leg flaccidity. Denied history of stroke and is not on antiplatelet therapy. Patient denies history of headaches, shortness of breath, chest pain, palpitations, or any focal neurological deficits. ?Wife at bedside.  ? ? ?Past Medical History: ?     There is no history of Atrial Fibrillation ?     There is no history of Stroke ? ?Medications: ? ?No Anticoagulant use  ?No Antiplatelet use ?Reviewed EMR for current medications ? ?Allergies:  ?Reviewed ? ?Social History: ?Smoking: No ?Alcohol Use: No ?Drug Use: No ? ?Family History: ? ?There Is Family History Of:Non contributory ?There is no family history of premature cerebrovascular disease pertinent to this consultation ? ?ROS : ?14 Points Review of Systems was performed and was negative except mentioned in HPI. ? ?Past Surgical History: ?There Is No Surgical History Contributory To Today?s Visit ? ? ?Examination: ?BP(159/93), Pulse(81), Blood Glucose(175) ?1A: Level of Consciousness - Alert; keenly responsive + 0 ?1B: Ask Month and Age - Both  Questions Right + 0 ?1C: Blink Eyes & Squeeze Hands - Performs Both Tasks + 0 ?2: Test Horizontal Extraocular Movements - Normal + 0 ?3: Test Visual Fields - No Visual Loss + 0 ?4: Test Facial Palsy (Use Grimace if Obtunded) - Partial paralysis (lower face) + 2 ?5A: Test Left Arm Motor Drift - No Drift for 10 Seconds + 0 ?5B: Test Right Arm Motor Drift - No Movement + 4 ?6A: Test Left Leg Motor Drift - No Drift for 5 Seconds + 0 ?6B: Test Right Leg Motor Drift - Drift, but doesn't hit bed + 1 ?7: Test Limb Ataxia (FNF/Heel-Shin) - No Ataxia + 0 ?8: Test Sensation - Normal; No sensory loss + 0 ?9: Test Language/Aphasia - Normal; No aphasia + 0 ?10: Test Dysarthria - Mild-Moderate Dysarthria: Slurring but can be understood + 1 ?11: Test Extinction/Inattention - No abnormality + 0 ? ?NIHSS Score: 8 ? ?Pre-Morbid Modified Rankin Scale: ?0 Points = No symptoms at all ? ? ?Patient/Family was informed the Neurology Consult would occur via TeleHealth consult by way of interactive audio and video telecommunications and consented to receiving care in this manner. ? ? ?Patient is being evaluated for possible acute neurologic impairment and high probability of imminent or life-threatening deterioration. I spent total of 40 minutes providing care to this patient, including time for face to face visit via telemedicine, review of medical records, imaging studies and discussion of findings with providers, the patient and/or family. ? ? ?Dr Rica Records Jenalyn Girdner ? ? ?[facility] ?[phone] ? ?Case [id number] ? ?

## 2022-02-11 NOTE — Progress Notes (Signed)
PT Cancellation Note ? ?Patient Details ?Name: [name] ?MRN: [id number] ?DOB: 02/23/1961 ? ? ?Cancelled Treatment:    Reason Eval/Treat Not Completed: Active bedrest order; will continue attempts.  ? ? ?Elray Mcgregor ?02/11/2022, 1:40 PM ?Sheran Lawless, PT ?Acute Rehabilitation Services ?[phone] ?Office:[phone] ?02/11/2022 ? ?

## 2022-02-11 NOTE — Anesthesia Procedure Notes (Signed)
Procedure Name: Intubation ?Date/Time: 02/11/2022 8:34 AM ?Performed by: Gaylene Brooks, CRNA ?Pre-anesthesia Checklist: Patient identified, Emergency Drugs available, Suction available and Patient being monitored ?Patient Re-evaluated:Patient Re-evaluated prior to induction ?Oxygen Delivery Method: Circle System Utilized ?Preoxygenation: Pre-oxygenation with 100% oxygen ?Induction Type: IV induction and Rapid sequence ?Laryngoscope Size: Sabra Heck and 2 ?Grade View: Grade II ?Tube type: Oral ?Tube size: 8.0 mm ?Number of attempts: 1 ?Airway Equipment and Method: Stylet and Oral airway ?Placement Confirmation: ETT inserted through vocal cords under direct vision, positive ETCO2 and breath sounds checked- equal and bilateral ?Secured at: 23 cm ?Tube secured with: Tape ?Dental Injury: Teeth and Oropharynx as per pre-operative assessment  ? ? ? ? ?

## 2022-02-11 NOTE — Transfer of Care (Signed)
Immediate Anesthesia Transfer of Care Note ? ?Patient: Gordon Fisher ? ?Procedure(s) Performed: IR WITH ANESTHESIA ? ?Patient Location: ICU ? ?Anesthesia Type:General ? ?Level of Consciousness: Patient remains intubated per anesthesia plan ? ?Airway & Oxygen Therapy: Patient remains intubated per anesthesia plan and Patient placed on Ventilator (see vital sign flow sheet for setting) ? ?Post-op Assessment: Report given to RN and Post -op Vital signs reviewed and stable ? ?Post vital signs: Reviewed and stable ? ?Last Vitals:  ?Vitals Value Taken Time  ?BP 120/78 02/11/22 2000  ?Temp    ?Pulse 88 02/11/22 2013  ?Resp 18 02/11/22 2013  ?SpO2 97 % 02/11/22 2013  ?Vitals shown include unvalidated device data. ? ?Last Pain:  ?Vitals:  ? 02/11/22 1600  ?TempSrc: Axillary  ?PainSc:   ?   ? ?  ? ?Complications: No notable events documented. ?

## 2022-02-12 ENCOUNTER — Inpatient Hospital Stay (HOSPITAL_COMMUNITY): Payer: 59

## 2022-02-12 ENCOUNTER — Encounter (HOSPITAL_COMMUNITY): Payer: Self-pay | Admitting: Radiology

## 2022-02-12 DIAGNOSIS — I63512 Cerebral infarction due to unspecified occlusion or stenosis of left middle cerebral artery: Secondary | ICD-10-CM

## 2022-02-12 DIAGNOSIS — R0689 Other abnormalities of breathing: Secondary | ICD-10-CM

## 2022-02-12 LAB — BASIC METABOLIC PANEL
Anion gap: 9 (ref 5–15)
BUN: 14 mg/dL (ref 6–20)
CO2: 19 mmol/L — ABNORMAL LOW (ref 22–32)
Calcium: 8 mg/dL — ABNORMAL LOW (ref 8.9–10.3)
Chloride: 107 mmol/L (ref 98–111)
Creatinine, Ser: 1 mg/dL (ref 0.61–1.24)
GFR, Estimated: >60 mL/min (ref >60)
Glucose, Bld: 123 mg/dL — ABNORMAL HIGH (ref 70–99)
Potassium: 4.7 mmol/L (ref 3.5–5.1)
Sodium: 135 mmol/L (ref 135–145)

## 2022-02-12 LAB — ECHOCARDIOGRAM COMPLETE
AR max vel: 3.09 cm2
AV Area VTI: 3.2 cm2
AV Area mean vel: 3.13 cm2
AV Mean grad: 3 mmHg
AV Peak grad: 5.8 mmHg
Ao pk vel: 1.2 m/s
Area-P 1/2: 2.71 cm2
Height: 66 in
MV VTI: 2.45 cm2
S' Lateral: 2.9 cm
Weight: 3774.4 oz

## 2022-02-12 LAB — CBC WITH DIFFERENTIAL/PLATELET
Abs Immature Granulocytes: 0.07 10*3/uL (ref 0.00–0.07)
Basophils Absolute: 0 10*3/uL (ref 0.0–0.1)
Basophils Relative: 0 %
Eosinophils Absolute: 0 10*3/uL (ref 0.0–0.5)
Eosinophils Relative: 0 %
HCT: 45.1 % (ref 39.0–52.0)
Hemoglobin: 15.3 g/dL (ref 13.0–17.0)
Immature Granulocytes: 1 %
Lymphocytes Relative: 19 %
Lymphs Abs: 2.9 10*3/uL (ref 0.7–4.0)
MCH: 31.7 pg (ref 26.0–34.0)
MCHC: 33.9 g/dL (ref 30.0–36.0)
MCV: 93.6 fL (ref 80.0–100.0)
Monocytes Absolute: 2 10*3/uL — ABNORMAL HIGH (ref 0.1–1.0)
Monocytes Relative: 13 %
Neutro Abs: 10.4 10*3/uL — ABNORMAL HIGH (ref 1.7–7.7)
Neutrophils Relative %: 67 %
Platelets: 170 10*3/uL (ref 150–400)
RBC: 4.82 MIL/uL (ref 4.22–5.81)
RDW: 12.6 % (ref 11.5–15.5)
WBC: 15.3 10*3/uL — ABNORMAL HIGH (ref 4.0–10.5)
nRBC: 0 % (ref 0.0–0.2)

## 2022-02-12 LAB — LIPID PANEL
Cholesterol: 198 mg/dL (ref 0–200)
HDL: 47 mg/dL (ref >40)
LDL Cholesterol: 122 mg/dL — ABNORMAL HIGH (ref 0–99)
Total CHOL/HDL Ratio: 4.2 RATIO
Triglycerides: 146 mg/dL (ref <150)
VLDL: 29 mg/dL (ref 0–40)

## 2022-02-12 LAB — GLUCOSE, CAPILLARY
Glucose-Capillary: 125 mg/dL — ABNORMAL HIGH (ref 70–99)
Glucose-Capillary: 160 mg/dL — ABNORMAL HIGH (ref 70–99)

## 2022-02-12 IMAGING — CT CT HEAD W/O CM
4 series · 15 of 47 positions shown, 17 images · non-contrast
Comparison: Prior head CT examinations performed [DATE]. CT
angiogram head/neck [DATE]. Brain MRI [DATE].

CLINICAL DATA: Provided history: Neuro deficit, acute, stroke
suspected. 24 hours post TNKase.



[Series 3: head without · axial · non-contrast · 0.45mm/px · z∈[-118,+7]mm · 7 of 35 slices shown, 9 images]
[im 5/35  brain]
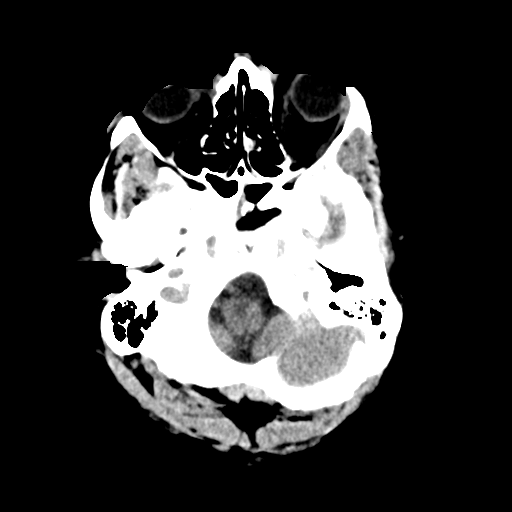
[im 5/35  bone]
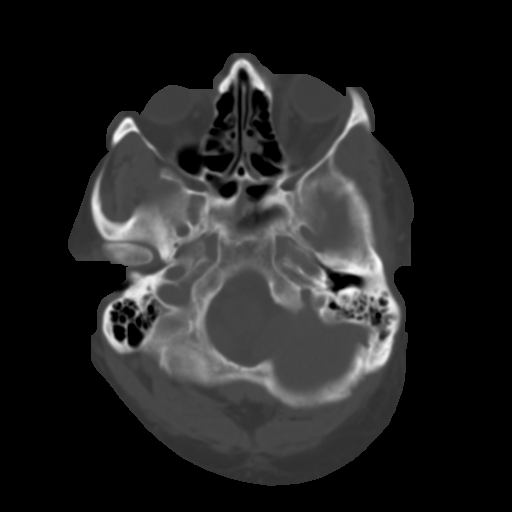
[im 9/35  brain]
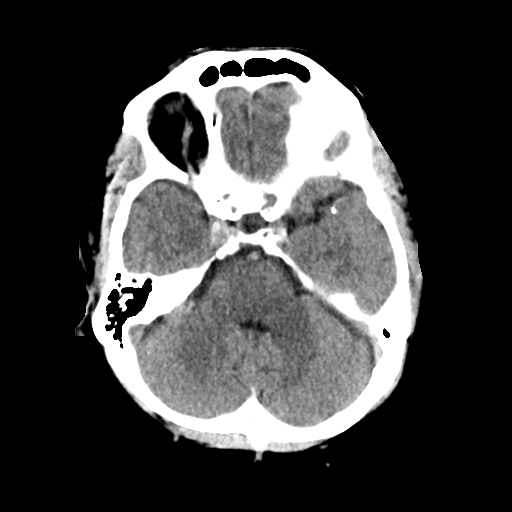
[im 13/35  brain]
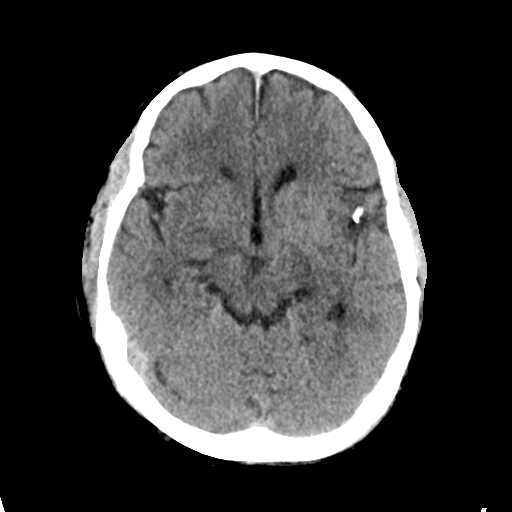
[im 18/35  brain]
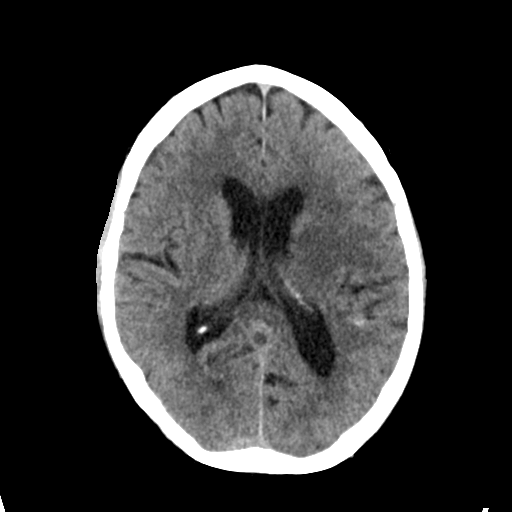
[im 22/35  brain]
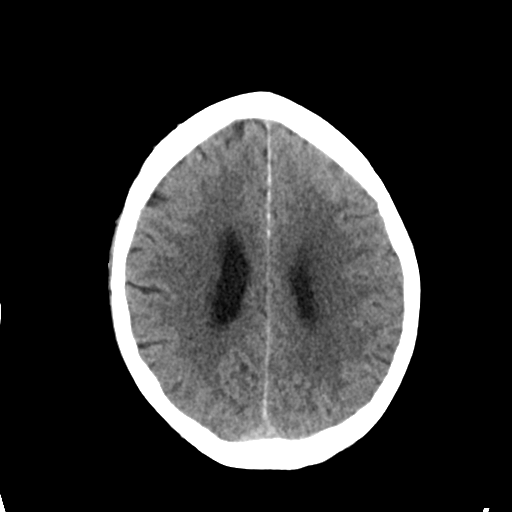
[im 22/35  bone]
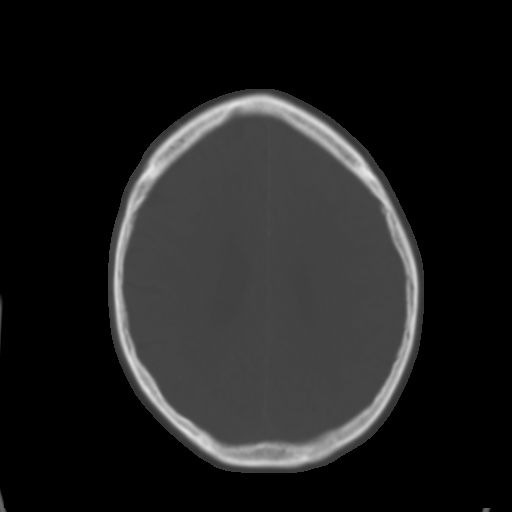
[im 26/35  brain]
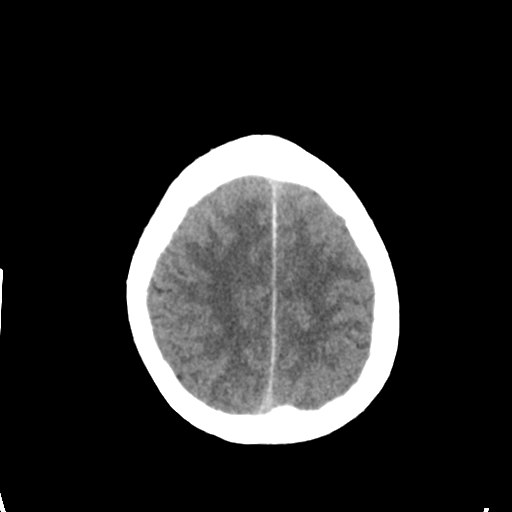
[im 30/35  brain]
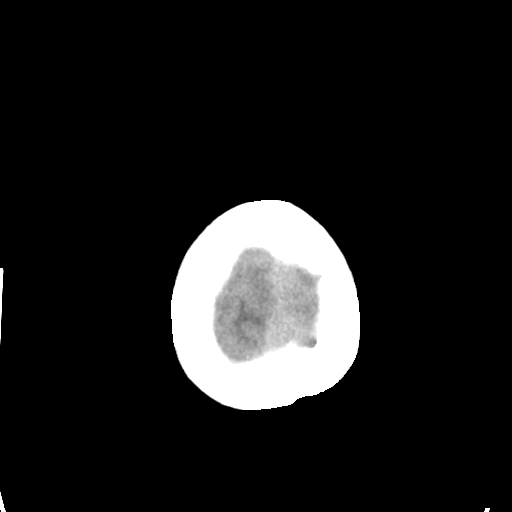

[Series 4: head bone · axial · 0.45mm/px · z∈[-122,-104]mm · 2 of 86 slices shown]
[im 9/86  bone]
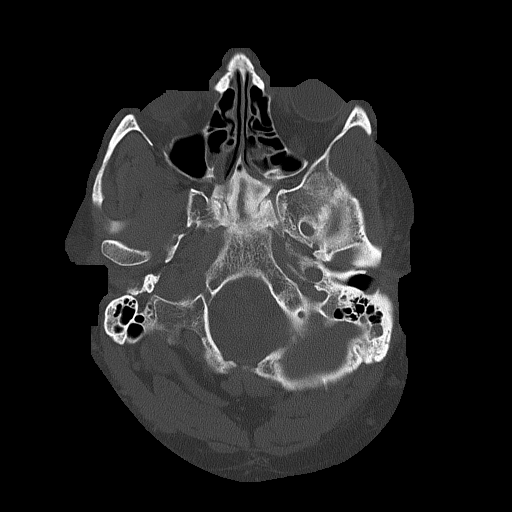
[im 18/86  bone]
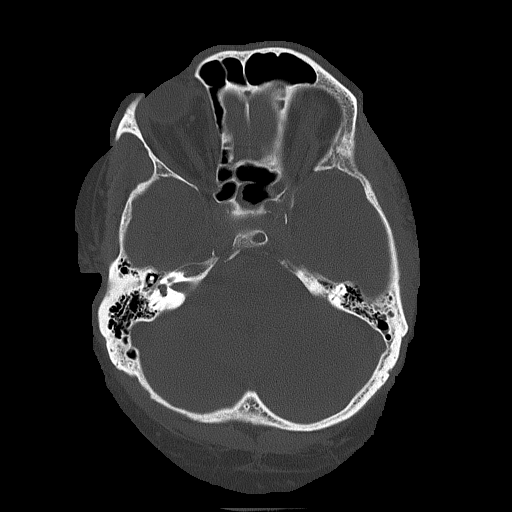

[Series 5: head without cor · coronal · non-contrast · 0.35mm/px · 3 of 67 slices shown]
[im 23/67  brain]
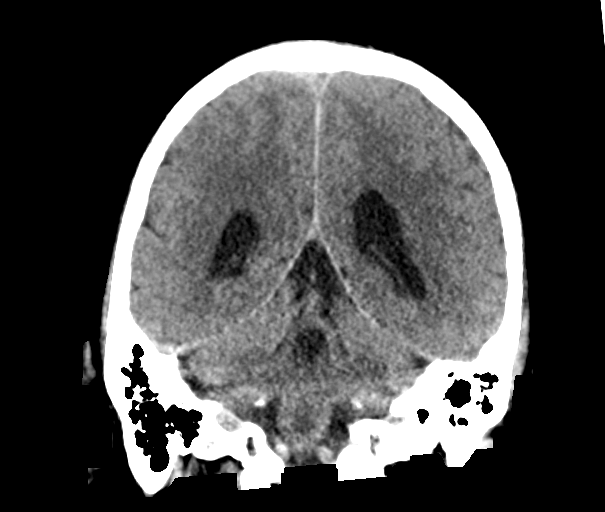
[im 30/67  brain]
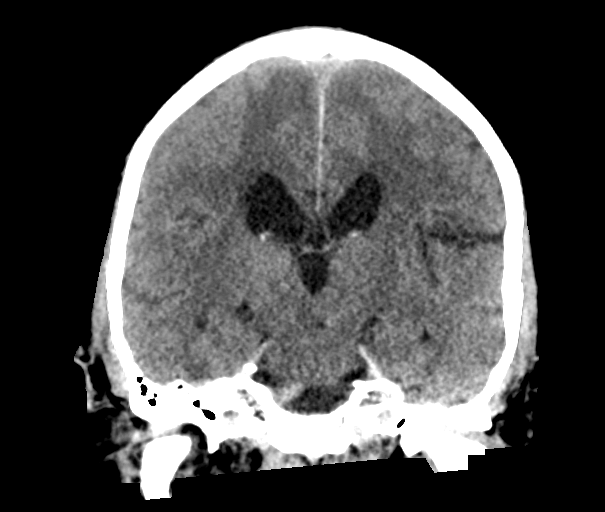
[im 37/67  brain]
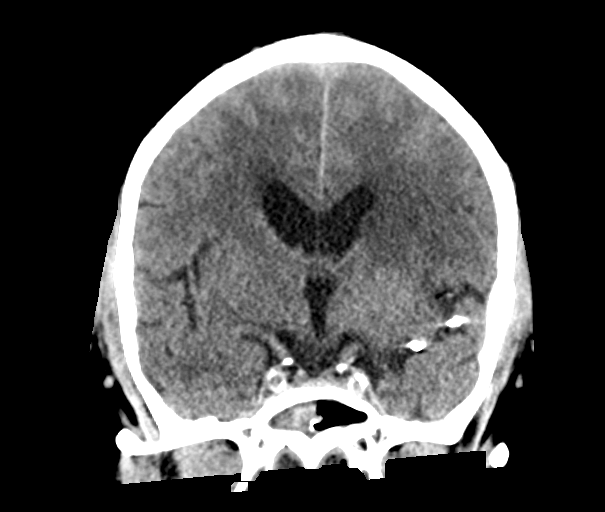

[Series 6: head without sag · sagittal · non-contrast · 0.34mm/px · 3 of 53 slices shown]
[im 18/53  brain]
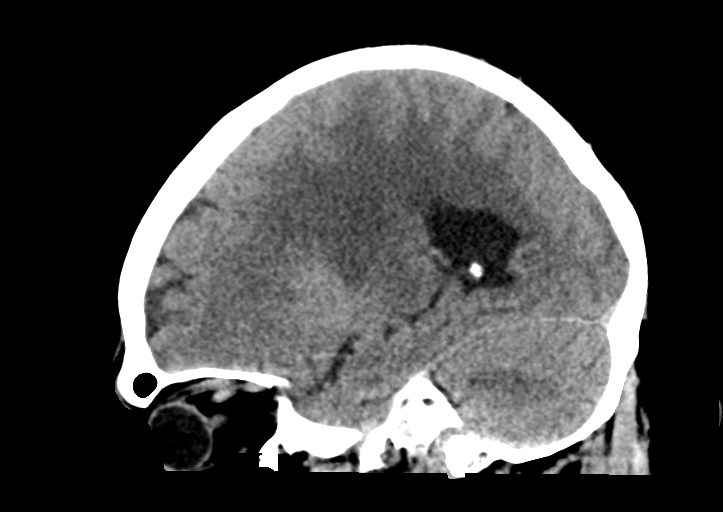
[im 27/53  brain]
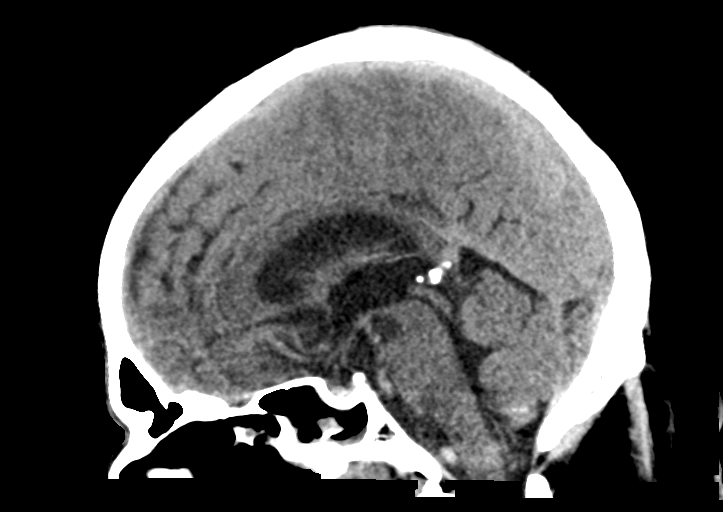
[im 35/53  brain]
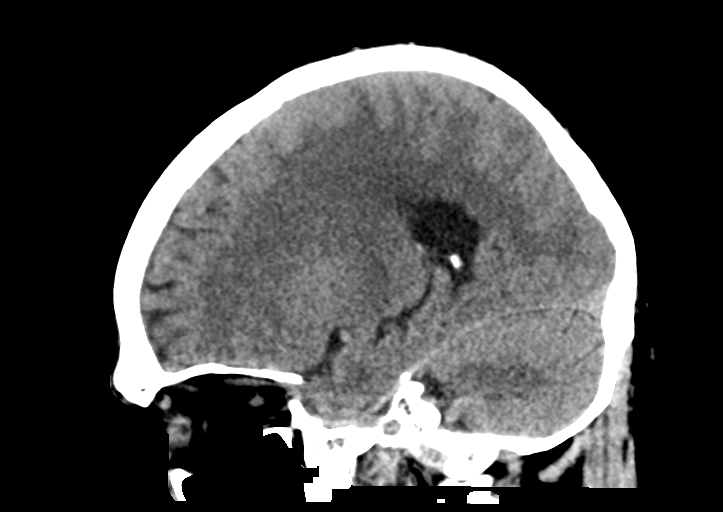

[15 of 47 positions shown; findings below may reference images not displayed]

FINDINGS: Brain:

Cerebral volume is normal.

Redemonstrated known acute left MCA territory infarcts within the
left frontal, parietal and temporal lobes as well as left insula and
left basal ganglia. Since the most recent prior head CT of
[DATE], there has been slight further demarcation of these
infarcts (for instance at the left insula). As before, there is
superimposed contrast staining within the left basal ganglia. No
significant mass effect at this time.

Persistent small-volume hyperdensity along the posterior aspect of
the left sylvian fissure, likely reflecting subarachnoid hemorrhage.

No evidence of an intracranial mass.

No midline shift.

Vascular: Redemonstrated stents within proximal left MCA vessels.
Atherosclerotic calcifications.

Skull: Normal. Negative for fracture or focal lesion.

Sinuses/Orbits: Visualized orbits show no acute finding. Mild
mucosal thickening and fluid scattered within the bilateral ethmoid
sinuses. Mild mucosal thickening within the right sphenoid sinus.
Moderate/severe mucosal thickening within the left sphenoid sinus
with associated chronic reactive osteitis. Trace mucosal thickening
within the right maxillary sinus at the imaged levels. Trace mucosal
thickening and small fluid level within the left maxillary sinus at
the imaged levels.
IMPRESSION: 1. Redemonstrated acute left MCA territory infarcts. Since the most
recent prior head CT of [DATE], there has been slight further
demarcation of these infarcts (for instance at the left insula).
2. Persistent small-volume hyperdensity along the posterior aspect
of the left Sylvian fissure, likely reflecting acute subarachnoid
hemorrhage.
3. Paranasal sinus disease, as described.

## 2022-02-12 IMAGING — DX DG ABD PORTABLE 1V
1 series · 1 of 1 positions shown · non-contrast
Comparison: [DATE]

CLINICAL DATA: 60-year-old male status post stroke

EXAM:
PORTABLE ABDOMEN - 1 VIEW

[abdomen supine]
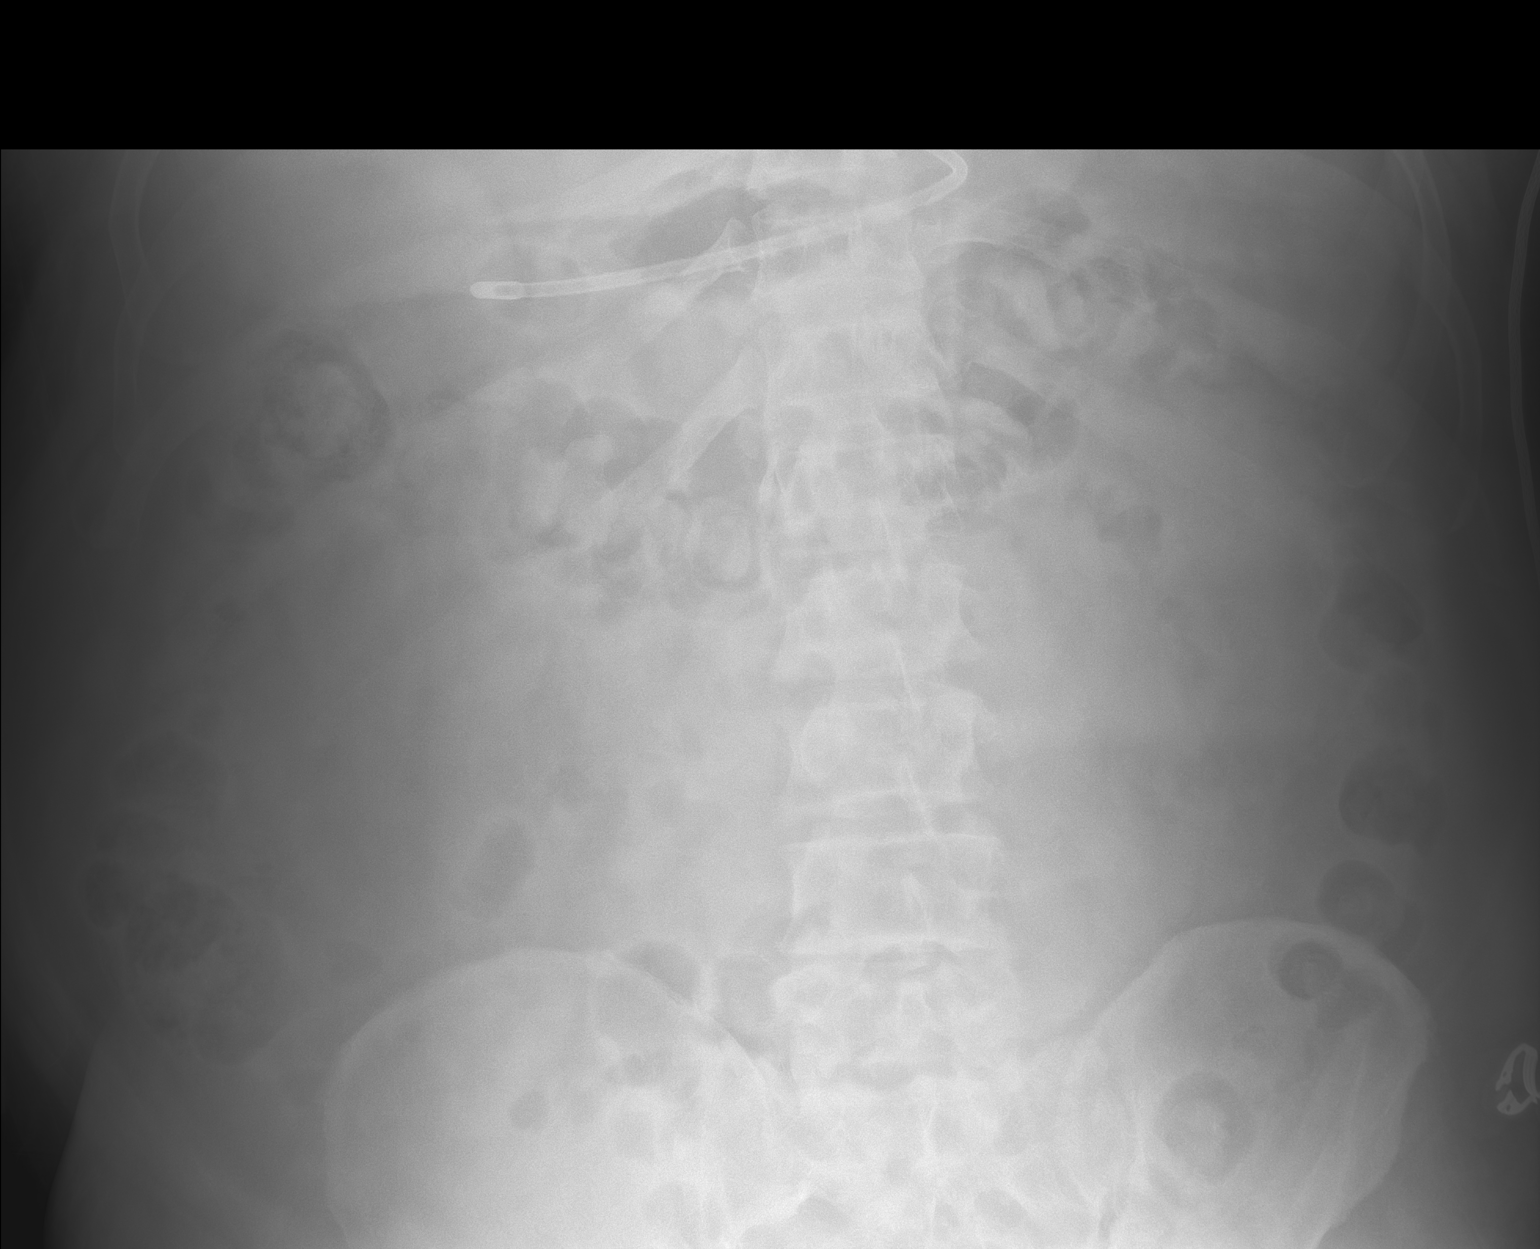

[1 of 1 positions shown; findings below may reference images not displayed]

FINDINGS: Interval removal of the gastric tube in placement of weighted tip
enteric feeding tube. The tip terminates near the pyloric channel,
to the right of midline.

Gas within small bowel and colon without abnormal distention.
IMPRESSION: Interval exchange of the gastric tube for weighted tip enteric
feeding tube, terminating near the pyloric channel

## 2022-02-12 IMAGING — DX DG ABD PORTABLE 1V
1 series · 1 of 1 positions shown · non-contrast
Comparison: [DATE].

CLINICAL DATA: OG tube placement

EXAM:
PORTABLE ABDOMEN - 1 VIEW

[abdomen supine]
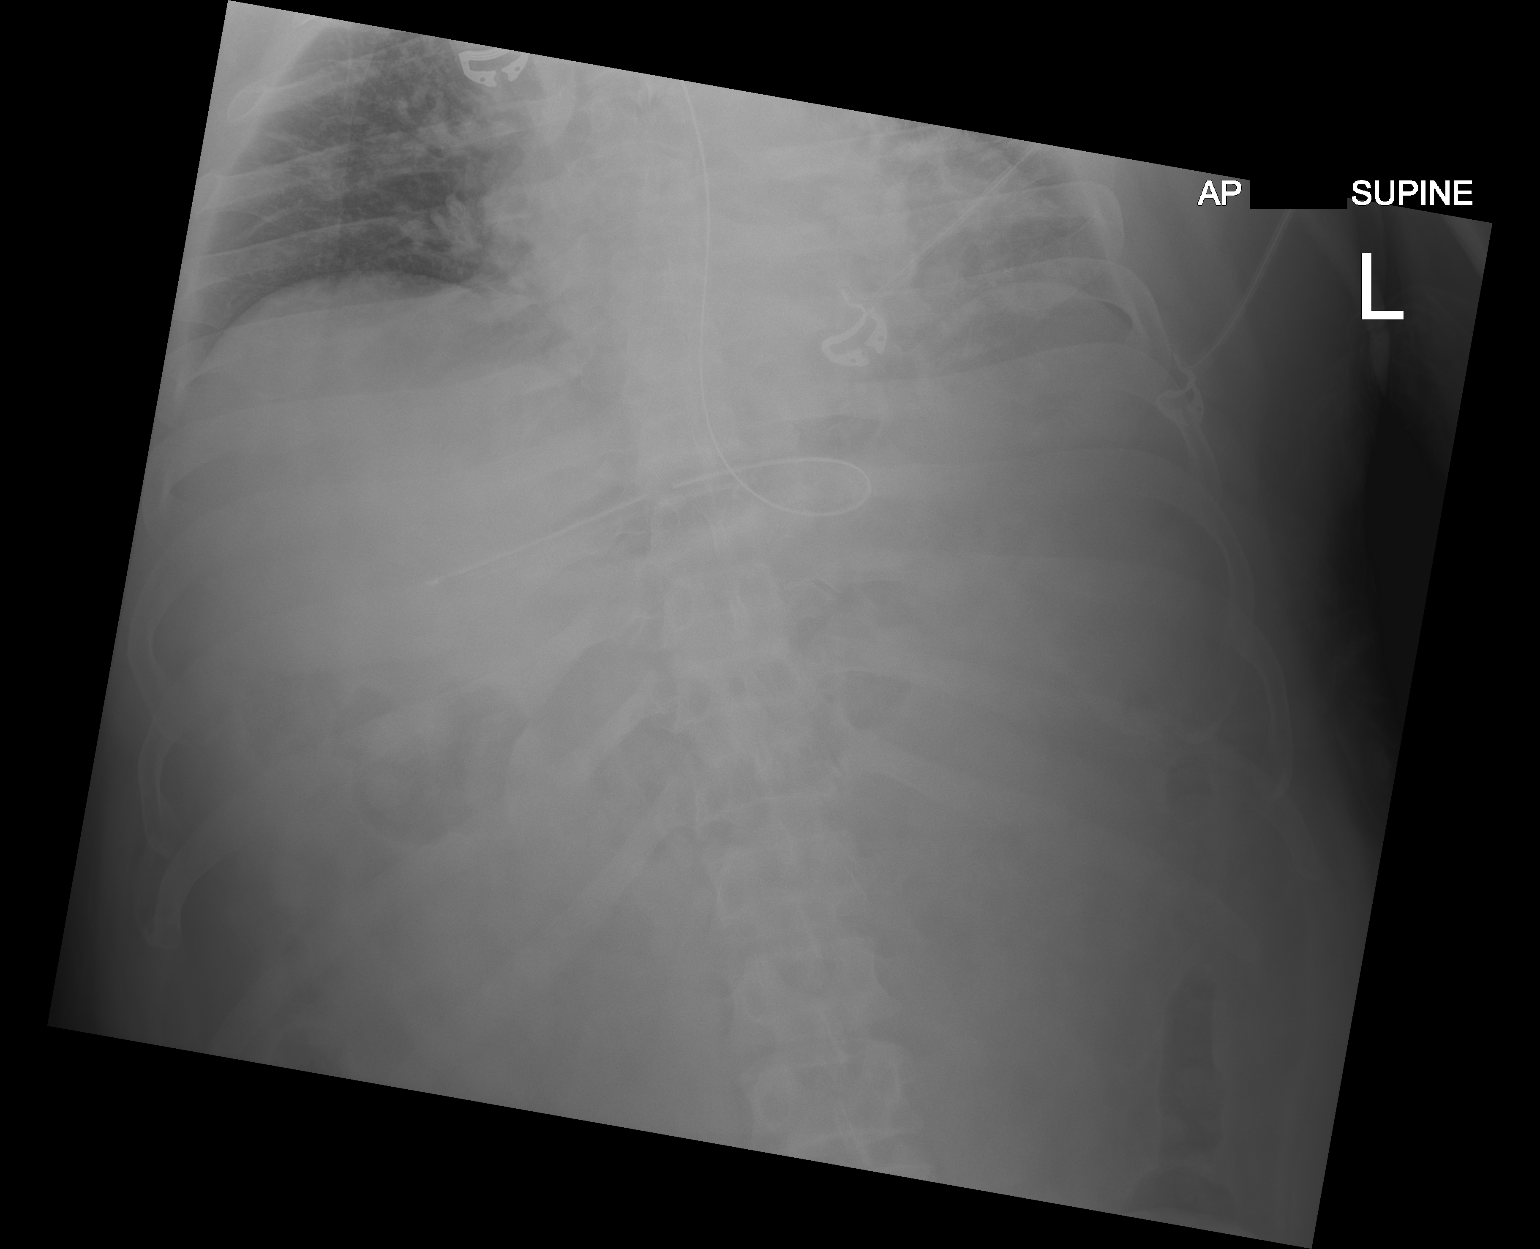

[1 of 1 positions shown; findings below may reference images not displayed]

FINDINGS: Esophageal tube tip overlies the distal stomach. Incompletely
visualized cardiomegaly and vascular congestion. Upper gas pattern
is nonobstructed.
IMPRESSION: Esophageal tube tip overlies the distal stomach

## 2022-02-12 MED ORDER — ATORVASTATIN CALCIUM 40 MG PO TABS
40.0000 mg | ORAL_TABLET | Freq: Every evening | ORAL | Status: DC
  Administered 2022-02-12 – 2022-02-17 (×6): 40 mg
  Filled 2022-02-12 (×6): qty 1

## 2022-02-12 MED ORDER — TICAGRELOR 90 MG PO TABS
90.0000 mg | ORAL_TABLET | Freq: Two times a day (BID) | ORAL | Status: DC
  Administered 2022-02-12 – 2022-02-18 (×13): 90 mg
  Filled 2022-02-12 (×13): qty 1

## 2022-02-12 MED ORDER — PROSOURCE TF PO LIQD
45.0000 mL | Freq: Two times a day (BID) | ORAL | Status: DC
  Administered 2022-02-12 – 2022-02-18 (×13): 45 mL
  Filled 2022-02-12 (×13): qty 45

## 2022-02-12 MED ORDER — OSMOLITE 1.5 CAL PO LIQD
1000.0000 mL | ORAL | Status: DC
  Administered 2022-02-12 – 2022-02-18 (×6): 1000 mL
  Filled 2022-02-12: qty 1000

## 2022-02-12 NOTE — Progress Notes (Signed)
? ?NAME:  Gordon Fisher, MRN:  [id number], DOB:  15-Dec-1960, LOS: 1 ?ADMISSION DATE:  02/11/2022 CONSULTATION DATE:  02/11/2022 ?REFERRING MD:  Stroke - EDP CHIEF COMPLAINT:  L MCA occlusion  ? ?History of Present Illness:  ?61 year old man who presented to [facility] ED via EMS 4/13 for R-sided neurologic deficits (R-sided weakness, R facial droop and slurred speech). No significant PMHx. ? ?Patient initially presented to [facility] ED via EMS after waking up with right-sided weakness and slurred speech.  He was evaluated at [facility] by Telestroke and CT Head was obtained demonstrating hyperdense L MCA.  Subsequent CTA demonstrated L MCA M1 occlusion.  TNK was administered at 0721. Patient was transferred to [facility] for NIR thrombectomy. ? ?Initial NIR procedure demonstrated occlusion of the distal left M1 MCA; mechanical thrombectomy was completed with TICI 3 recanalization. Post-procedure CT and MRI Brain were completed. Several hours postprocedure, patient experienced recurrence of neurologic deficits prompting return to NIR; procedure demonstrated distal L M1 occlusion prompting repeat thrombectomy and stent placement x 2. Stent unfortunately became occluded and was unable to be crossed. Patient returned to ICU with cangrelor gtt. ? ?PCCM consulted for vent/BP management. ? ?Pertinent Medical History:  ?History reviewed. No pertinent past medical history. ? ?Significant Hospital Events: ?Including procedures, antibiotic start and stop dates in addition to other pertinent events   ?4/13 Presented to [facility] via EMS with R-sided neurologic deficits. Evaluated by Telestroke, CT Head with hyperdense L MCA (ASPECTS 10). CTA L MCA M1 occlusion. Received TNK [id number]. NIR thrombectomy with TICI 3 revascularization. Subsequent MRI showed small areas of infarct in L MCA territory with mild hemorrhagic transformation ? ?Interim History / Subjective:  ?Afebrile  ?PSV wean 5/5, 40% ?I/O 2.4L UOP, -12ml since admit ?Pending follow up CT head   ? ?Objective:  ?Blood pressure 117/79, pulse 70, temperature 98.7 ?F (37.1 ?C), temperature source Oral, resp. rate 14, height 5\' 6"  (1.676 m), weight 107 kg, SpO2 98 %. ?   ?Vent Mode: PRVC ?FiO2 (%):  [40 %-60 %] 40 % ?Set Rate:  [16 bmp] 16 bmp ?Vt Set:  [510 mL] 510 mL ?PEEP:  [5 cmH20] 5 cmH20  ? ?Intake/Output Summary (Last 24 hours) at 02/12/2022 0759 ?Last data filed at 02/12/2022 0600 ?Gross per 24 hour  ?Intake 2337.9 ml  ?Output 2525 ml  ?Net -187.1 ml  ? ?Filed Weights  ? 02/11/22 [id number]  ?Weight: 107 kg  ? ?Physical Examination: ?General: critically ill appearing adult male lying in bed on vent in NAD ?HEENT: MM pink/moist, ETT, anicteric, pupils briskly reactive ?Neuro: sedate, opens eyes to voice, spontaneous movement of left side noted ?CV: s1s2 RRR, SR 70's on monitor, no m/r/g ?PULM: non-labored at rest on PSV, lungs bilaterally clear with good air entry  ?GI: soft, bsx4 active  ?Extremities: warm/dry, no edema  ?Skin: no rashes or lesions ? ?Resolved Hospital Problem List:  ? ? ?Assessment & Plan:  ? ?CVA in setting of Left MCA M1 Occlusion ?S/p NIR thrombectomy x 2 with Reocclusion ?Small Hemorrhagic Conversion  ?-management per Stroke Team  ?-defer ASA/Brilinta to NIR/Neurology  ?-SBP goal 120-140  ?-ECHO Pending  ?-neuroprotective measures > HOB elevated, avoid fever, glucose/oxygen disturbances ? ?Need for post-procedural mechanical ventilation ?-PRVC 8cc/kg as rest mode ventilation  ?-PSV wean after return from CT  ?-hopeful for extubation 4/14  ?-VAP prevention measures  ?-PAD protocol with fentanyl + propofol for RASS goal 0 to -1 ? ?At Risk Malnutrition  ?-consider TF if not extubated 4/14  ? ?  Best Practice: (right click and "Reselect all SmartList Selections" daily)  ?Diet/type: NPO w/ meds via tube ?DVT prophylaxis: Per Stroke team (Primary) ?GI prophylaxis: PPI ?Lines: N/A ?Foley:  Yes, and it is still needed ?Code Status:  full code ?Last date of multidisciplinary goals of care  discussion: full code.  Wife relays that patient has never really gone to seek medical care, former [facility] & very independent.  She is concerned about his ability to recover but is hopeful for him.   ? ?Critical care time: 36 minutes  ? ?Noe Gens, MSN, APRN, NP-C, AGACNP-BC ?[facility] ?02/12/2022, 9:01 AM ? ? ?Please see [email or web address] for pager details.  ? ?From 7A-7P if no response, please call [phone] ?After hours, please call ELink [phone] ? ?

## 2022-02-12 NOTE — Evaluation (Signed)
Physical Therapy Evaluation ?Patient Details ?Name: Gordon Fisher ?MRN: [id number] ?DOB: 1961-09-12 ?Today's Date: 02/12/2022 ? ?History of Present Illness ? 61 y.o. male presents to [facility] hospital on 02/11/2022 with R weakness, facial droop and slurred speech. [place] dmonstrates L MCA. Pt given tNK and underwent thrombectomy on 4/13 with revascularization. Pt with recurrence of symptoms, returned to IR with stent placement x2 however stent became occluded. Pt remained intubated after procedure. No significant PMH noted.  ?Clinical Impression ? Pt presents to PT with deficits in functional mobility, gait, balance, cognition, communication, awareness. Pt shows signs of global aphasia, leading to difficulty following commands consistently. Pt with R hemiplegia and R inattention, often losing balance to R side during session. Pt requires significant physical assistance to perform all functional mobility tasks at this time. Pt will benefit from continued acute PT services in an effort to improve transfer quality and progress to ambulation. PT recommends AIR admission at this time.   ?   ? ?Recommendations for follow up therapy are one component of a multi-disciplinary discharge planning process, led by the attending physician.  Recommendations may be updated based on patient status, additional functional criteria and insurance authorization. ? ?Follow Up Recommendations Acute inpatient rehab (3hours/day) ? ?  ?Assistance Recommended at Discharge Frequent or constant Supervision/Assistance  ?Patient can return home with the following ? Two people to help with walking and/or transfers;Two people to help with bathing/dressing/bathroom;Assistance with cooking/housework;Assistance with feeding;Direct supervision/assist for medications management;Assist for transportation;Direct supervision/assist for financial management;Help with stairs or ramp for entrance ? ?  ?Equipment Recommendations Wheelchair (measurements PT);Wheelchair  cushion (measurements PT);Hospital bed (hoyer lift)  ?Recommendations for Other Services ? Rehab consult  ?  ?Functional Status Assessment Patient has had a recent decline in their functional status and demonstrates the ability to make significant improvements in function in a reasonable and predictable amount of time.  ? ?  ?Precautions / Restrictions Precautions ?Precautions: Fall ?Precaution Comments: R inattention ?Restrictions ?Weight Bearing Restrictions: No  ? ?  ? ?Mobility ? Bed Mobility ?Overal bed mobility: Needs Assistance ?Bed Mobility: Supine to Sit, Sit to Supine ?  ?  ?Supine to sit: +2 for physical assistance, Max assist ?Sit to supine: Total assist, +2 for physical assistance ?  ?  ?  ? ?Transfers ?Overall transfer level: Needs assistance ?Equipment used: 2 person hand held assist ?Transfers: Sit to/from Stand ?Sit to Stand: Max assist, +2 physical assistance ?  ?  ?  ?  ?  ?General transfer comment: R knee block, R knee buckles with first attempt without solid block. Pt leaning to right side ?  ? ?Ambulation/Gait ?  ?  ?  ?  ?  ?  ?  ?  ? ?Stairs ?  ?  ?  ?  ?  ? ?Wheelchair Mobility ?  ? ?Modified Rankin (Stroke Patients Only) ?Modified Rankin (Stroke Patients Only) ?Pre-Morbid Rankin Score: No symptoms ?Modified Rankin: Severe disability ? ?  ? ?Balance Overall balance assessment: Needs assistance ?Sitting-balance support: Single extremity supported, Feet supported ?Sitting balance-Leahy Scale: Poor ?Sitting balance - Comments: max-totalA initially with posterior and R lateral lean. Pt improves with time, progressing to minG-minA without distraction. With distraction or 2nd task added the pt does fall to right. Demonstrates awareness to attempt to correct balance but requires assistance ?Postural control: Right lateral lean ?Standing balance support: Bilateral upper extremity supported ?Standing balance-Leahy Scale: Zero ?Standing balance comment: maxA x 2, R knee block ?  ?  ?  ?  ?  ?  ?  ?  ?   ?  ?  ?   ? ? ? ?  Pertinent Vitals/Pain Pain Assessment ?Pain Assessment: CPOT ?Facial Expression: Relaxed, neutral ?Body Movements: Absence of movements ?Muscle Tension: Relaxed ?Compliance with ventilator (intubated pts.): N/A ?Vocalization (extubated pts.): Talking in normal tone or no sound ?CPOT Total: 0 ?Pain Intervention(s): Monitored during session  ? ? ?Home Living Family/patient expects to be discharged to:: Private residence ?Living Arrangements: Spouse/significant other ?Available Help at Discharge: Family;Available 24 hours/day ?Type of Home: House ?Home Access: Stairs to enter ?Entrance Stairs-Rails: Right;Left ?Entrance Stairs-Number of Steps: 5+3 ?  ?Home Layout: Two level;Able to live on main level with bedroom/bathroom ?Home Equipment: None ?   ?  ?Prior Function Prior Level of Function : Independent/Modified Independent ?  ?  ?  ?  ?  ?  ?Mobility Comments: pt still farming ?  ?  ? ? ?Hand Dominance  ? Dominant Hand: Right ? ?  ?Extremity/Trunk Assessment  ? Upper Extremity Assessment ?Upper Extremity Assessment: Defer to OT evaluation ?  ? ?Lower Extremity Assessment ?Lower Extremity Assessment: RLE deficits/detail;LLE deficits/detail ?RLE Deficits / Details: flaccid, no AROM noted. PROM WFL ?LLE Deficits / Details: performs SLR and able to hold against gravity, difficult to formally assess due to impaird communication ?  ? ?Cervical / Trunk Assessment ?Cervical / Trunk Assessment: Normal  ?Communication  ?    ?Cognition Arousal/Alertness: Awake/alert ?Behavior During Therapy: Flat affect ?Overall Cognitive Status: Difficult to assess ?Area of Impairment: Following commands ?  ?  ?  ?  ?  ?  ?  ?  ?  ?  ?  ?Following Commands: Follows one step commands inconsistently ?  ?  ?  ?General Comments: pt appears to follow some commands with visual/tactile cueing but not verbal cues. Pt does perseverate on cues for UE movement, inconsistently following during session ?  ?  ? ?  ?General Comments General  comments (skin integrity, edema, etc.): VSS on RA, pt turns head to right but PT does not observe eye movement right of midline. ? ?  ?Exercises    ? ?Assessment/Plan  ?  ?PT Assessment Patient needs continued PT services  ?PT Problem List Decreased strength;Decreased activity tolerance;Decreased balance;Decreased mobility;Decreased cognition;Decreased knowledge of use of DME;Decreased safety awareness;Decreased knowledge of precautions;Impaired tone ? ?   ?  ?PT Treatment Interventions DME instruction;Gait training;Stair training;Functional mobility training;Therapeutic activities;Therapeutic exercise;Balance training;Neuromuscular re-education;Cognitive remediation;Patient/family education;Wheelchair mobility training   ? ?PT Goals (Current goals can be found in the Care Plan section)  ?Acute Rehab PT Goals ?Patient Stated Goal: family states goal of returning to independence ?PT Goal Formulation: With family ?Time For Goal Achievement: 02/26/22 ?Potential to Achieve Goals: Fair ? ?  ?Frequency Min 4X/week ?  ? ? ?Co-evaluation PT/OT/SLP Co-Evaluation/Treatment: Yes ?Reason for Co-Treatment: Complexity of the patient's impairments (multi-system involvement);Necessary to address cognition/behavior during functional activity;For patient/therapist safety;To address functional/ADL transfers ?PT goals addressed during session: Mobility/safety with mobility;Balance;Strengthening/ROM ?  ?  ? ? ?  ?AM-PAC PT "6 Clicks" Mobility  ?Outcome Measure Help needed turning from your back to your side while in a flat bed without using bedrails?: Total ?Help needed moving from lying on your back to sitting on the side of a flat bed without using bedrails?: Total ?Help needed moving to and from a bed to a chair (including a wheelchair)?: Total ?Help needed standing up from a chair using your arms (e.g., wheelchair or bedside chair)?: Total ?Help needed to walk in hospital room?: Total ?Help needed climbing 3-5 steps with a railing?  : Total ?6 Click Score: 6 ? ?  ?  End of Session Equipment Utilized During Treatment: Gait belt ?Activity Tolerance: Patient tolerated treatment well ?Patient left: in bed;with call bell/phone within reac

## 2022-02-12 NOTE — Progress Notes (Signed)
Pt placed back on full support and transported to CT and back to 4N28 without complications. RT will continue to monitor. ?

## 2022-02-12 NOTE — Progress Notes (Signed)
? ? ?Referring Physician(s): ?Dr. Lorrin Goodell ? ?Supervising Physician: Pedro Earls ? ?Patient Status:  [facility] - In-pt ? ?Chief Complaint: ?Code Stroke ? ?Subjective: ?Patient at bedside alongside Dr. Karenann Cai. ?Remains intubated after re-intervention last PM for intra-stent occlusion.  After multiple attempt to reopen stent, ultimately clot reformed and now both stents have re-occluded.   ?Discussed with family at bedside.  ? ?Allergies: ?Patient has no known allergies. ? ?Medications: ?Prior to Admission medications   ?Medication Sig Start Date End Date Taking? Authorizing Provider  ?Calcium Carbonate Antacid (TUMS PO) Take 1-2 tablets by mouth daily as needed (acid reflux/indigestion).   Yes [provider]  ? ? ? ?Vital Signs: ?BP 112/74   Pulse 77   Temp 99.6 ?F (37.6 ?C) (Axillary)   Resp 14   Ht 5\' 6"  (1.676 m)   Wt 235 lb 14.4 oz (107 kg)   SpO2 98%   BMI 38.08 kg/m?  ? ?Physical Exam ?Vitals and nursing note reviewed.  ?Constitutional:   ?   General: He is not in acute distress. ?   Appearance: Normal appearance. He is not ill-appearing.  ?HENT:  ?   Mouth/Throat:  ?   Mouth: Mucous membranes are moist.  ?   Pharynx: Oropharynx is clear.  ?Cardiovascular:  ?   Rate and Rhythm: Normal rate and regular rhythm.  ?Pulmonary:  ?   Effort: Pulmonary effort is normal.  ?   Breath sounds: Normal breath sounds.  ?Neurological:  ?   Mental Status: He is alert.  ? ? ?Imaging: ?CT ANGIO HEAD NECK W WO CM ? ?Result Date: 02/11/2022 ?CLINICAL DATA:  Code stroke. 61 year old male with sudden onset right side weakness. EXAM: CT ANGIOGRAPHY HEAD AND NECK TECHNIQUE: Multidetector CT imaging of the head and neck was performed using the standard protocol during bolus administration of intravenous contrast. Multiplanar CT image reconstructions and MIPs were obtained to evaluate the vascular anatomy. Carotid stenosis measurements (when applicable) are obtained utilizing NASCET criteria,  using the distal internal carotid diameter as the denominator. RADIATION DOSE REDUCTION: This exam was performed according to the departmental dose-optimization program which includes automated exposure control, adjustment of the mA and/or kV according to patient size and/or use of iterative reconstruction technique. CONTRAST:  57mL OMNIPAQUE IOHEXOL 350 MG/ML SOLN COMPARISON:  Plain head CT 0659 hours. FINDINGS: CTA NECK Skeleton: No acute osseous abnormality identified. Ordinary cervical spine degeneration. Upper chest: Atelectasis and mediastinal lipomatosis. Other neck: Small volume retained secretions in the pharynx. Otherwise negative. Aortic arch: 3 vessel arch configuration. Minimal arch atherosclerosis. Right carotid system: Tortuous brachiocephalic artery without plaque or stenosis. Minor soft and calcified plaque at the right carotid bifurcation without stenosis. Left carotid system: Mild tortuosity and carotid bifurcation plaque with no stenosis. Vertebral arteries: Negative.  Mildly dominant left vertebral artery. CTA HEAD Posterior circulation: Minimal right V4 calcified plaque without stenosis. Dominant left V4 segment. Normal PICA origins and vertebrobasilar junction. Patent basilar artery without stenosis. Patent SCA and PCA origins. Posterior communicating arteries are diminutive or absent. PCA branches are within normal limits. Anterior circulation: Both ICA siphons are patent. Mild to moderate calcified plaque primarily in the supraclinoid segments. Mild supraclinoid stenosis results greater on the left. Patent carotid termini, MCA and ACA origins. Anterior communicating artery and ACA branches are within normal limits. Right MCA M1 segment and bifurcation are patent without stenosis. Right MCA branches are within normal limits. Left MCA origin is patent but the left M1 is occluded about 12  mm distal to the origin (series 7, image 97). The left anterior temporal artery origin is patent proximal  to the occlusion and there is good reconstitution of left MCA branches Venous sinuses: Patent. Anatomic variants: Dominant left vertebral artery. Review of the MIP images confirms the above findings IMPRESSION: 1. 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 This was discussed by telephone with Dr. Delora Fuel on [id number] at 0711 hours. Anterior temporal artery remains patent and there is robust MCA branch collateral enhancement. 2. Generally mild for age atherosclerosis, most pronounced in the supraclinoid ICA siphons with calcified plaque. No other significant arterial stenosis. Electronically Signed   By: Genevie Ann M.D.   On: 02/11/2022 07:20  ? ?CT HEAD WO CONTRAST (5MM) ? ?Result Date: 02/12/2022 ?CLINICAL DATA:  Provided history: Neuro deficit, acute, stroke suspected. 24 hours post TNKase. EXAM: CT HEAD WITHOUT CONTRAST TECHNIQUE: Contiguous axial images were obtained from the base of the skull through the vertex without intravenous contrast. RADIATION DOSE REDUCTION: This exam was performed according to the departmental dose-optimization program which includes automated exposure control, adjustment of the mA and/or kV according to patient size and/or use of iterative reconstruction technique. COMPARISON:  Prior head CT examinations performed 02/11/2022. CT angiogram head/neck 02/11/2022. Brain MRI 02/11/2022. FINDINGS: Brain: Cerebral volume is normal. Redemonstrated known acute left MCA territory infarcts within the left frontal, parietal and temporal lobes as well as left insula and left basal ganglia. Since the most recent prior head CT of 02/11/2022, there has been slight further demarcation of these infarcts (for instance at the left insula). As before, there is superimposed contrast staining within the left basal ganglia. No significant mass effect at this time. Persistent small-volume hyperdensity along the posterior aspect of the left sylvian fissure, likely reflecting subarachnoid  hemorrhage. No evidence of an intracranial mass. No midline shift. Vascular: Redemonstrated stents within proximal left MCA vessels. Atherosclerotic calcifications. Skull: Normal. Negative for fracture or focal lesion. Sinuses/Orbits: Visualized orbits show no acute finding. Mild mucosal thickening and fluid scattered within the bilateral ethmoid sinuses. Mild mucosal thickening within the right sphenoid sinus. Moderate/severe mucosal thickening within the left sphenoid sinus with associated chronic reactive osteitis. Trace mucosal thickening within the right maxillary sinus at the imaged levels. Trace mucosal thickening and small fluid level within the left maxillary sinus at the imaged levels. IMPRESSION: 1. Redemonstrated acute left MCA territory infarcts. Since the most recent prior head CT of 02/11/2022, there has been slight further demarcation of these infarcts (for instance at the left insula). 2. Persistent small-volume hyperdensity along the posterior aspect of the left Sylvian fissure, likely reflecting acute subarachnoid hemorrhage. 3. Paranasal sinus disease, as described. Electronically Signed   By: Kellie Simmering D.O.   On: 02/12/2022 09:32  ? ?CT HEAD WO CONTRAST ? ?Result Date: 02/11/2022 ?CLINICAL DATA:  Follow-up examination for stroke, status post TNK and mechanical thrombectomy. EXAM: CT HEAD WITHOUT CONTRAST TECHNIQUE: Contiguous axial images were obtained from the base of the skull through the vertex without intravenous contrast. RADIATION DOSE REDUCTION: This exam was performed according to the departmental dose-optimization program which includes automated exposure control, adjustment of the mA and/or kV according to patient size and/or use of iterative reconstruction technique. COMPARISON:  Prior CT from earlier the same day. FINDINGS: Brain: Patchy hypodensity involving the left basal ganglia of an adjacent left frontotemporal region, consistent with evolving left MCA distribution infarct.  Associated contrast staining at the left basal ganglia. More dense hyperdensity at the posterior margin  of the left sylvian fissure likely reflects a small amount of acute subarachnoid hemorrhage (series 3, image 19).

## 2022-02-12 NOTE — H&P (View-Only) (Signed)
STROKE TEAM PROGRESS NOTE  ? ?INTERVAL HISTORY ?Patient is seen in his room with two family members at the bedside.  Yesterday, he awoke at [place] and went back to sleep.  He awoke again 30 minutes later with slurred speech and right sided weakness.  He was brought to [facility] and was given TNK then transferred here for thrombectomy.  Mechnaical thrombectomy was performed in the left M1 MCA with TICI 3 flow achieved.  Unfortunately, later that day the left M1 MCA reoccluded.  Mechanical thrombectomy was performed again with recanalization achieved but posterior division of the MCA dissection occurred.  A stent was placed, but this unfortunately occluded and was unable to be recanalized.  Patient has remained intubated after the procedure and hopefully will be extubated later today. ?Postprocedure CT showed small subarachnoid hemorrhage but repeat CT from this morning shows stable appearance of the hemorrhage.  Cangrelor drip has been stopped this morning.  Patient be started on Brilinta and aspirin ?Vitals:  ? 02/12/22 1000 02/12/22 1100 02/12/22 1200 02/12/22 1300  ?BP: 121/73 126/79 119/79 112/74  ?Pulse: 72 86 81 77  ?Resp: 15 12 12 14   ?Temp:   99.6 ?F (37.6 ?C)   ?TempSrc:   Axillary   ?SpO2: 98% 98% 99% 98%  ?Weight:      ?Height:      ? ?CBC:  ?Recent Labs  ?Lab 02/11/22 ?0654 02/11/22 ?2108 02/12/22 ?0421  ?WBC 7.8  --  15.3*  ?NEUTROABS 2.8  --  10.4*  ?HGB 16.4 14.3 15.3  ?HCT 48.8 42.0 45.1  ?MCV 91.6  --  93.6  ?PLT 181  --  170  ? ?Basic Metabolic Panel:  ?Recent Labs  ?Lab 02/11/22 ?0654 02/11/22 ?2108 02/12/22 ?0421  ?NA 136 135 135  ?K 3.8 4.7 4.7  ?CL 107  --  107  ?CO2 21*  --  19*  ?GLUCOSE 161*  --  123*  ?BUN 20  --  14  ?CREATININE 1.20  --  1.00  ?CALCIUM 8.8*  --  8.0*  ? ?Lipid Panel:  ?Recent Labs  ?Lab 02/12/22 ?0421  ?CHOL 198  ?TRIG 146  ?HDL 47  ?CHOLHDL 4.2  ?VLDL 29  ?[place] 122*  ? ?HgbA1c:  ?Recent Labs  ?Lab 02/11/22 ?0654  ?HGBA1C 6.0*  ? ?Urine Drug Screen:  ?Recent  Labs  ?Lab 02/11/22 ?1608  ?LABOPIA NONE DETECTED  ?COCAINSCRNUR NONE DETECTED  ?LABBENZ NONE DETECTED  ?AMPHETMU NONE DETECTED  ?THCU NONE DETECTED  ?LABBARB NONE DETECTED  ?  ?Alcohol Level  ?Recent Labs  ?Lab 02/11/22 ?0654  ?ETH <10  ? ? ?IMAGING past 24 hours ?CT HEAD WO CONTRAST (5MM) ? ?Result Date: 02/12/2022 ?CLINICAL DATA:  Provided history: Neuro deficit, acute, stroke suspected. 24 hours post TNKase. EXAM: CT HEAD WITHOUT CONTRAST TECHNIQUE: Contiguous axial images were obtained from the base of the skull through the vertex without intravenous contrast. RADIATION DOSE REDUCTION: This exam was performed according to the departmental dose-optimization program which includes automated exposure control, adjustment of the mA and/or kV according to patient size and/or use of iterative reconstruction technique. COMPARISON:  Prior head CT examinations performed 02/11/2022. CT angiogram head/neck 02/11/2022. Brain MRI 02/11/2022. FINDINGS: Brain: Cerebral volume is normal. Redemonstrated known acute left MCA territory infarcts within the left frontal, parietal and temporal lobes as well as left insula and left basal ganglia. Since the most recent prior head CT of 02/11/2022, there has been slight further demarcation of these infarcts (for instance at the left  insula). As before, there is superimposed contrast staining within the left basal ganglia. No significant mass effect at this time. Persistent small-volume hyperdensity along the posterior aspect of the left sylvian fissure, likely reflecting subarachnoid hemorrhage. No evidence of an intracranial mass. No midline shift. Vascular: Redemonstrated stents within proximal left MCA vessels. Atherosclerotic calcifications. Skull: Normal. Negative for fracture or focal lesion. Sinuses/Orbits: Visualized orbits show no acute finding. Mild mucosal thickening and fluid scattered within the bilateral ethmoid sinuses. Mild mucosal thickening within the right sphenoid  sinus. Moderate/severe mucosal thickening within the left sphenoid sinus with associated chronic reactive osteitis. Trace mucosal thickening within the right maxillary sinus at the imaged levels. Trace mucosal thickening and small fluid level within the left maxillary sinus at the imaged levels. IMPRESSION: 1. Redemonstrated acute left MCA territory infarcts. Since the most recent prior head CT of 02/11/2022, there has been slight further demarcation of these infarcts (for instance at the left insula). 2. Persistent small-volume hyperdensity along the posterior aspect of the left Sylvian fissure, likely reflecting acute subarachnoid hemorrhage. 3. Paranasal sinus disease, as described. Electronically Signed   By: Kellie Simmering D.O.   On: 02/12/2022 09:32  ? ?CT HEAD WO CONTRAST ? ?Result Date: 02/11/2022 ?CLINICAL DATA:  Follow-up examination for stroke, status post TNK and mechanical thrombectomy. EXAM: CT HEAD WITHOUT CONTRAST TECHNIQUE: Contiguous axial images were obtained from the base of the skull through the vertex without intravenous contrast. RADIATION DOSE REDUCTION: This exam was performed according to the departmental dose-optimization program which includes automated exposure control, adjustment of the mA and/or kV according to patient size and/or use of iterative reconstruction technique. COMPARISON:  Prior CT from earlier the same day. FINDINGS: Brain: Patchy hypodensity involving the left basal ganglia of an adjacent left frontotemporal region, consistent with evolving left MCA distribution infarct. Associated contrast staining at the left basal ganglia. More dense hyperdensity at the posterior margin of the left sylvian fissure likely reflects a small amount of acute subarachnoid hemorrhage (series 3, image 19). No significant regional mass effect at this time. No other acute intracranial hemorrhage or large vessel territory infarct. No mass lesion or midline shift. No hydrocephalus or extra-axial  fluid collection. Vascular: Interval placement of a Y stent into the distal left MCA/proximal MCA branches. No visible hyperdense vessel. Skull: Scalp soft tissues and calvarium demonstrate no new abnormality. Sinuses/Orbits: Globes and orbital soft tissues demonstrate no acute finding. Mucosal thickening noted within the sphenoid ethmoidal sinuses. Patient is intubated. Trace left mastoid effusion. Other: None. IMPRESSION: 1. Evolving left MCA distribution infarct as above. Small volume hyperdensity at the posterior margin of the left Sylvian fissure likely reflects acute subarachnoid hemorrhage. No significant regional mass effect at this time. 2. Interval stenting of the proximal left MCA branches. 3. No other new acute intracranial abnormality. These results were communicated to Dr. Leonel Ramsay at 10:52 pm on 02/11/2022 by text page via the [facility] messaging system. Electronically Signed   By: Jeannine Boga M.D.   On: 02/11/2022 22:53  ? ?MR BRAIN WO CONTRAST ? ?Result Date: 02/11/2022 ?CLINICAL DATA:  Stroke.  Left MCA thrombectomy 02/11/2022 EXAM: MRI HEAD WITHOUT CONTRAST TECHNIQUE: Multiplanar, multiecho pulse sequences of the brain and surrounding structures were obtained without intravenous contrast. COMPARISON:  CT head pre and post procedure 02/11/2022 FINDINGS: Brain: Acute infarct left MCA territory. Small areas of restricted diffusion in the left caudate and left putamen. Small areas of restricted diffusion in the left insula, left frontal white matter, and left parietal cortex  and white matter. Small areas of petechial hemorrhage in the left parietal lobe compatible with mild hemorrhagic transformation. This area shows minimal restricted diffusion. Mild chronic microvascular ischemic change in the white matter. No mass lesion. Vascular: Abnormal flow related signal left MCA bifurcation on T2. This also show susceptibility on SWI. This is compatible with residual thrombus left MCA bifurcation.  Otherwise normal arterial flow voids. Skull and upper cervical spine: No focal skeletal abnormality. Sinuses/Orbits: Mild mucosal edema paranasal sinuses. Mild left mastoid effusion. Negative orbit Other: None IMP

## 2022-02-12 NOTE — Progress Notes (Addendum)
STROKE TEAM PROGRESS NOTE   INTERVAL HISTORY Patient is seen in his room with two family members at the bedside.  Yesterday, he awoke at 0515 and went back to sleep.  He awoke again 30 minutes later with slurred speech and right sided weakness.  He was brought to [facility] and was given TNK then transferred here for thrombectomy.  Mechnaical thrombectomy was performed in the left M1 MCA with TICI 3 flow achieved.  Unfortunately, later that day the left M1 MCA reoccluded.  Mechanical thrombectomy was performed again with recanalization achieved but posterior division of the MCA dissection occurred.  A stent was placed, but this unfortunately occluded and was unable to be recanalized.  Patient has remained intubated after the procedure and hopefully will be extubated later today. Postprocedure CT showed small subarachnoid hemorrhage but repeat CT from this morning shows stable appearance of the hemorrhage.  Cangrelor drip has been stopped this morning.  Patient be started on Brilinta and aspirin Vitals:   02/12/22 1000 02/12/22 1100 02/12/22 1200 02/12/22 1300  BP: 121/73 126/79 119/79 112/74  Pulse: 72 86 81 77  Resp: 15 12 12 14   Temp:   99.6 F (37.6 C)   TempSrc:   Axillary   SpO2: 98% 98% 99% 98%  Weight:      Height:       CBC:  Recent Labs  Lab 02/11/22 0654 02/11/22 2108 02/12/22 0421  WBC 7.8  --  15.3*  NEUTROABS 2.8  --  10.4*  HGB 16.4 14.3 15.3  HCT 48.8 42.0 45.1  MCV 91.6  --  93.6  PLT 181  --  170   Basic Metabolic Panel:  Recent Labs  Lab 02/11/22 0654 02/11/22 2108 02/12/22 0421  NA 136 135 135  K 3.8 4.7 4.7  CL 107  --  107  CO2 21*  --  19*  GLUCOSE 161*  --  123*  BUN 20  --  14  CREATININE 1.20  --  1.00  CALCIUM 8.8*  --  8.0*   Lipid Panel:  Recent Labs  Lab 02/12/22 0421  CHOL 198  TRIG 146  HDL 47  CHOLHDL 4.2  VLDL 29  LDLCALC 161*   HgbA1c:  Recent Labs  Lab 02/11/22 0654  HGBA1C 6.0*   Urine Drug Screen:  Recent  Labs  Lab 02/11/22 1608  LABOPIA NONE DETECTED  COCAINSCRNUR NONE DETECTED  LABBENZ NONE DETECTED  AMPHETMU NONE DETECTED  THCU NONE DETECTED  LABBARB NONE DETECTED    Alcohol Level  Recent Labs  Lab 02/11/22 0654  ETH <10    IMAGING past 24 hours CT HEAD WO CONTRAST ( )  Result Date: 02/12/2022 CLINICAL DATA:  Provided history: Neuro deficit, acute, stroke suspected. 24 hours post TNKase. EXAM: CT HEAD WITHOUT CONTRAST TECHNIQUE: Contiguous axial images were obtained from the base of the skull through the vertex without intravenous contrast. RADIATION DOSE REDUCTION: This exam was performed according to the departmental dose-optimization program which includes automated exposure control, adjustment of the mA and/or kV according to patient size and/or use of iterative reconstruction technique. COMPARISON:  Prior head CT examinations performed 02/11/2022. CT angiogram head/neck 02/11/2022. Brain MRI 02/11/2022. FINDINGS: Brain: Cerebral volume is normal. Redemonstrated known acute left MCA territory infarcts within the left frontal, parietal and temporal lobes as well as left insula and left basal ganglia. Since the most recent prior head CT of 02/11/2022, there has been slight further demarcation of these infarcts (for instance at the left  insula). As before, there is superimposed contrast staining within the left basal ganglia. No significant mass effect at this time. Persistent small-volume hyperdensity along the posterior aspect of the left sylvian fissure, likely reflecting subarachnoid hemorrhage. No evidence of an intracranial mass. No midline shift. Vascular: Redemonstrated stents within proximal left MCA vessels. Atherosclerotic calcifications. Skull: Normal. Negative for fracture or focal lesion. Sinuses/Orbits: Visualized orbits show no acute finding. Mild mucosal thickening and fluid scattered within the bilateral ethmoid sinuses. Mild mucosal thickening within the right sphenoid  sinus. Moderate/severe mucosal thickening within the left sphenoid sinus with associated chronic reactive osteitis. Trace mucosal thickening within the right maxillary sinus at the imaged levels. Trace mucosal thickening and small fluid level within the left maxillary sinus at the imaged levels. IMPRESSION: 1. Redemonstrated acute left MCA territory infarcts. Since the most recent prior head CT of 02/11/2022, there has been slight further demarcation of these infarcts (for instance at the left insula). 2. Persistent small-volume hyperdensity along the posterior aspect of the left Sylvian fissure, likely reflecting acute subarachnoid hemorrhage. 3. Paranasal sinus disease, as described. Electronically Signed   By: Jackey Loge D.O.   On: 02/12/2022 09:32   CT HEAD WO CONTRAST  Result Date: 02/11/2022 CLINICAL DATA:  Follow-up examination for stroke, status post TNK and mechanical thrombectomy. EXAM: CT HEAD WITHOUT CONTRAST TECHNIQUE: Contiguous axial images were obtained from the base of the skull through the vertex without intravenous contrast. RADIATION DOSE REDUCTION: This exam was performed according to the departmental dose-optimization program which includes automated exposure control, adjustment of the mA and/or kV according to patient size and/or use of iterative reconstruction technique. COMPARISON:  Prior CT from earlier the same day. FINDINGS: Brain: Patchy hypodensity involving the left basal ganglia of an adjacent left frontotemporal region, consistent with evolving left MCA distribution infarct. Associated contrast staining at the left basal ganglia. More dense hyperdensity at the posterior margin of the left sylvian fissure likely reflects a small amount of acute subarachnoid hemorrhage (series 3, image 19). No significant regional mass effect at this time. No other acute intracranial hemorrhage or large vessel territory infarct. No mass lesion or midline shift. No hydrocephalus or extra-axial  fluid collection. Vascular: Interval placement of a Y stent into the distal left MCA/proximal MCA branches. No visible hyperdense vessel. Skull: Scalp soft tissues and calvarium demonstrate no new abnormality. Sinuses/Orbits: Globes and orbital soft tissues demonstrate no acute finding. Mucosal thickening noted within the sphenoid ethmoidal sinuses. Patient is intubated. Trace left mastoid effusion. Other: None. IMPRESSION: 1. Evolving left MCA distribution infarct as above. Small volume hyperdensity at the posterior margin of the left Sylvian fissure likely reflects acute subarachnoid hemorrhage. No significant regional mass effect at this time. 2. Interval stenting of the proximal left MCA branches. 3. No other new acute intracranial abnormality. These results were communicated to Dr. Amada Jupiter at 10:52 pm on 02/11/2022 by text page via the [facility] messaging system. Electronically Signed   By: Rise Mu M.D.   On: 02/11/2022 22:53   MR BRAIN WO CONTRAST  Result Date: 02/11/2022 CLINICAL DATA:  Stroke.  Left MCA thrombectomy 02/11/2022 EXAM: MRI HEAD WITHOUT CONTRAST TECHNIQUE: Multiplanar, multiecho pulse sequences of the brain and surrounding structures were obtained without intravenous contrast. COMPARISON:  CT head pre and post procedure 02/11/2022 FINDINGS: Brain: Acute infarct left MCA territory. Small areas of restricted diffusion in the left caudate and left putamen. Small areas of restricted diffusion in the left insula, left frontal white matter, and left parietal cortex  and white matter. Small areas of petechial hemorrhage in the left parietal lobe compatible with mild hemorrhagic transformation. This area shows minimal restricted diffusion. Mild chronic microvascular ischemic change in the white matter. No mass lesion. Vascular: Abnormal flow related signal left MCA bifurcation on T2. This also show susceptibility on SWI. This is compatible with residual thrombus left MCA bifurcation.  Otherwise normal arterial flow voids. Skull and upper cervical spine: No focal skeletal abnormality. Sinuses/Orbits: Mild mucosal edema paranasal sinuses. Mild left mastoid effusion. Negative orbit Other: None IMPRESSION: 1. Small areas of acute infarct in the left MCA territory involving left caudate, putamen, insula, and left frontal and parietal lobe. Mild hemorrhagic transformation in the left parietal lobe compatible with Heidelberg class 1a hemorrhage. No mass-effect or midline shift. There is minimal restricted diffusion in this area on DWI. 2. Susceptibility in the left MCA bifurcation compatible with residual vascular thrombus. 3. No remote areas of acute infarct. Ventricle size within normal limits. 4. These results were called by telephone at the time of interpretation on 02/11/2022 at 3:38 pm to provider [facility] , who verbally acknowledged these results. Electronically Signed   By: Marlan Palau M.D.   On: 02/11/2022 15:39   DG CHEST PORT 1 VIEW  Result Date: 02/11/2022 CLINICAL DATA:  Intubated. EXAM: PORTABLE CHEST 1 VIEW COMPARISON:  None. FINDINGS: Endotracheal tube tip is 4.7 cm above the carina. Cardiomediastinal silhouette appears enlarged. Lung volumes are low. There is some patchy opacities in the bilateral mid lungs, left greater than right. Costophrenic angles are clear. No pneumothorax or acute fracture. IMPRESSION: 1. Endotracheal tube tip 4.7 cm above carina. 2. Cardiomegaly. 3. Bilateral mid lung airspace disease, left greater than right. Electronically Signed   By: Darliss Cheney M.D.   On: 02/11/2022 21:58   DG Abd Portable 1V  Result Date: 02/12/2022 CLINICAL DATA:  61 year old male status post stroke EXAM: PORTABLE ABDOMEN - 1 VIEW COMPARISON:  02/12/2022 FINDINGS: Interval removal of the gastric tube in placement of weighted tip enteric feeding tube. The tip terminates near the pyloric channel, to the right of midline. Gas within small bowel and colon without abnormal  distention. IMPRESSION: Interval exchange of the gastric tube for weighted tip enteric feeding tube, terminating near the pyloric channel Electronically Signed   By: Gilmer Mor D.O.   On: 02/12/2022 13:00   DG Abd Portable 1V  Result Date: 02/12/2022 CLINICAL DATA:  OG tube placement EXAM: PORTABLE ABDOMEN - 1 VIEW COMPARISON:  02/11/2022. FINDINGS: Esophageal tube tip overlies the distal stomach. Incompletely visualized cardiomegaly and vascular congestion. Upper gas pattern is nonobstructed. IMPRESSION: Esophageal tube tip overlies the distal stomach Electronically Signed   By: Jasmine Pang M.D.   On: 02/12/2022 00:39   ECHOCARDIOGRAM COMPLETE  Result Date: 02/12/2022    ECHOCARDIOGRAM REPORT   Patient Name:   [name] Date of Exam: 02/12/2022 Medical Rec #:  [id number]       Height:       66.0 in Accession #:    [id number]      Weight:       235.9 lb Date of Birth:  07-18-1961       BSA:          2.145 m Patient Age:    60 years        BP:           121/73 mmHg Patient Gender: M  HR:           70 bpm. Exam Location:  Inpatient Procedure: 2D Echo, Cardiac Doppler and Color Doppler Indications:    Stroke  History:        Patient has no prior history of Echocardiogram examinations.  Sonographer:    Ross Ludwig RDCS (AE) Referring Phys: [id number] [facility]  Sonographer Comments: Echo performed with patient supine and on artificial respirator. IMPRESSIONS  1. Left ventricular ejection fraction, by estimation, is 60 to 65%. The left ventricle has normal function. The left ventricle has no regional wall motion abnormalities. Left ventricular diastolic parameters were normal.  2. Right ventricular systolic function is normal. The right ventricular size is moderately enlarged.  3. Right atrial size was mildly dilated.  4. The mitral valve is normal in structure. No evidence of mitral valve regurgitation. No evidence of mitral stenosis.  5. The aortic valve is tricuspid. Aortic valve  regurgitation is not visualized. No aortic stenosis is present. FINDINGS  Left Ventricle: Left ventricular ejection fraction, by estimation, is 60 to 65%. The left ventricle has normal function. The left ventricle has no regional wall motion abnormalities. The left ventricular internal cavity size was normal in size. There is  no left ventricular hypertrophy. Left ventricular diastolic parameters were normal. Right Ventricle: The right ventricular size is moderately enlarged. Right vetricular wall thickness was not well visualized. Right ventricular systolic function is normal. Left Atrium: Left atrial size was normal in size. Right Atrium: Right atrial size was mildly dilated. Pericardium: There is no evidence of pericardial effusion. Mitral Valve: The mitral valve is normal in structure. No evidence of mitral valve regurgitation. No evidence of mitral valve stenosis. MV peak gradient, 3.4 mmHg. The mean mitral valve gradient is 1.0 mmHg. Tricuspid Valve: The tricuspid valve is normal in structure. Tricuspid valve regurgitation is trivial. Aortic Valve: The aortic valve is tricuspid. Aortic valve regurgitation is not visualized. No aortic stenosis is present. Aortic valve mean gradient measures 3.0 mmHg. Aortic valve peak gradient measures 5.8 mmHg. Aortic valve area, by VTI measures 3.20 cm. Pulmonic Valve: The pulmonic valve was normal in structure. Pulmonic valve regurgitation is trivial. Aorta: The aortic root and ascending aorta are structurally normal, with no evidence of dilitation. IAS/Shunts: The interatrial septum was not well visualized.  LEFT VENTRICLE PLAX 2D LVIDd:         4.50 cm   Diastology LVIDs:         2.90 cm   LV e' medial:    8.16 cm/s LV PW:         1.70 cm   LV E/e' medial:  9.3 LV IVS:        1.10 cm   LV e' lateral:   14.30 cm/s LVOT diam:     2.20 cm   LV E/e' lateral: 5.3 LV SV:         75 LV SV Index:   35 LVOT Area:     3.80 cm  RIGHT VENTRICLE             IVC RV Basal diam:  3.90  cm     IVC diam: 2.00 cm RV S prime:     16.30 cm/s TAPSE (M-mode): 2.8 cm LEFT ATRIUM           Index        RIGHT ATRIUM           Index LA diam:      3.90 cm 1.82 cm/m  RA Area:     18.20 cm LA Vol (A2C): 29.7 ml 13.85 ml/m  RA Volume:   52.30 ml  24.38 ml/m LA Vol (A4C): 38.1 ml 17.76 ml/m  AORTIC VALVE AV Area (Vmax):    3.09 cm AV Area (Vmean):   3.13 cm AV Area (VTI):     3.20 cm AV Vmax:           120.00 cm/s AV Vmean:          78.400 cm/s AV VTI:            0.234 m AV Peak Grad:      5.8 mmHg AV Mean Grad:      3.0 mmHg LVOT Vmax:         97.50 cm/s LVOT Vmean:        64.500 cm/s LVOT VTI:          0.197 m LVOT/AV VTI ratio: 0.84  AORTA Ao Root diam: 3.80 cm Ao Asc diam:  3.70 cm MITRAL VALVE MV Area (PHT): 2.71 cm    SHUNTS MV Area VTI:   2.45 cm    Systemic VTI:  0.20 m MV Peak grad:  3.4 mmHg    Systemic Diam: 2.20 cm MV Mean grad:  1.0 mmHg MV Vmax:       0.92 m/s MV Vmean:      57.2 cm/s MV Decel Time: 280 msec MV E velocity: 76.00 cm/s MV A velocity: 74.40 cm/s MV E/A ratio:  1.02 Kristeen Miss MD Electronically signed by Kristeen Miss MD Signature Date/Time: 02/12/2022/10:59:15 AM    Final     PHYSICAL EXAM General:  Intubated, on ventilatory support well-developed, well-nourished middle-aged Caucasian male patient in no acute distress Respiratory:  Respirations synchronous with ventilator Neurological: Exam ; patient is intubated and sedated.  Eyes are closed.  Does not respond to verbal or tactile stimuli.  He is globally aphasic.  PERRL, will look around room, does not follow commands.  Will break gravity with LUE and LLE, some movement of RLE to noxious stimuli, no movement of RUE  ASSESSMENT/PLAN Gordon Fisher is a 61 y.o. male with no significant medical history presenting with slurred speech and right sided weakness.  He was brought to [facility] and was given TNK then transferred here for thrombectomy.  Mechnaical thrombectomy was performed in the left M1 MCA  with TICI 3 flow achieved.  Unfortunately, later that day the left M1 MCA reoccluded.  Mechanical thrombectomy was performed again with recanalization achieved but dissection of the MCA occurred.  A stent was placed, but this unfortunately occluded and was unable to be recanalized.  Patient has remained intubated after the procedure and hopefully will be extubated later today.  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 x2 with stent placement for dissection and reocclusion of stent likely secondary due to large vessel atherosclerosis  code Stroke CT head Hyperdense left MCA, no ICH ASPECTS 9   CTA head & neck left MCA M1 occlusion Repeat CT head 4/13 1142 small volume SAH in left cerebral hemisphere, hyperdensity in left caudate head, lentiform nucleus and frontal and temporal lobe cortex, likely contrast staining Repeat CT head 4/14 evolving left MCA distribution infarct, small SAH, stenting of proximal left MCA branches MRI  small areas of acute infarct in left MCA territory involving left caudate putamen, insula and left frontal and parietal lobe. Mild HT in left parietal lobe 2D Echo EF 60-65%, interatrial septum not well  visualized LDL 122 HgbA1c 6.0 VTE prophylaxis - SCDs    Diet   Diet NPO time specified   No antithrombotic prior to admission, now on aspirin 81 mg daily and Brilinta (ticagrelor) 90 mg bid.  Therapy recommendations:  pending Disposition:  pending  Hypertension Home meds:  none Stable Keep SBP 120-140 for 24 hours after procedure, then <160 Long-term BP goal normotensive  Hyperlipidemia Home meds:  none LDL 122, goal < 70 Add atorvastatin 40 mg  Continue statin at discharge  Respiratory failure Patient left intubated after procedure Ventilator management per CCM Attempt to extubate today if possible  Other Stroke Risk Factors Obesity, Body mass index is 38.08 kg/m., BMI >/= 30 associated with increased stroke risk, recommend weight loss, diet and  exercise as appropriate    Other Active Problems none  Hospital day # 1  Cortney E Ernestina Columbia , MSN, AGACNP-BC [facility] See Amion for schedule and pager information 02/12/2022 2:06 PM   STROKE MD NOTE : I have personally obtained history,examined this patient, reviewed notes, independently viewed imaging studies, participated in medical decision making and plan of care.ROS completed by me personally and pertinent positives fully documented  I have made any additions or clarifications directly to the above note. Agree with note above.  Patient presented with aphasia and right hemiplegia due to left M1 occlusion and was treated with IV thrombolysis with TNK followed by successful mechanical thrombectomy with excellent recanalization however unfortunately his condition worsen several hours later with reocclusion requiring attempted repeat thrombectomy which was not as successful this time patient developed dissection of the inferior division of left MCA attempts to stent placement unsuccessful and stent became occluded.  Follow-up CT scan had shown small subarachnoid hemorrhage and moderate-sized infarct.  Continue close neurological monitoring and strict blood pressure control as per postthrombolytic and thrombectomy protocol.  Wean off ventilatory support and extubate as per pulmonary critical care team.  Continue aspirin and Brilinta for stent.  Had a long discussion at the bedside with the patient wife and daughter about his condition and discuss plan for evaluation and treatment and answered questions.  Discussed with critical care team.This patient is critically ill and at significant risk of neurological worsening, death and care requires constant monitoring of vital signs, hemodynamics,respiratory and cardiac monitoring, extensive review of multiple databases, frequent neurological assessment, discussion with family, other specialists and medical decision making of high complexity.I  have made any additions or clarifications directly to the above note.This critical care time does not reflect procedure time, or teaching time or supervisory time of PA/NP/Med Resident etc but could involve care discussion time.  I spent 40 minutes of neurocritical care time  in the care of  this patient.      Delia Heady, MD Medical Director [facility] Pager: [phone] 02/12/2022 3:06 PM   To contact Stroke Continuity provider, please refer to [email or web address]. After hours, contact General Neurology

## 2022-02-12 NOTE — Progress Notes (Signed)
?  Echocardiogram ?2D Echocardiogram has been performed. ? ?Gordon Fisher ?02/12/2022, 10:45 AM ?

## 2022-02-12 NOTE — Procedures (Signed)
Extubation Procedure Note ? ?Patient Details:   ?Name: Gordon Fisher ?DOB: 1961-01-02 ?MRN: [id number] ?  ?Airway Documentation:  ?Airway 7.5 mm (Active)  ?Secured at (cm) 24 cm 02/12/22 1107  ?Measured From Lips 02/12/22 1107  ?Secured Location Center 02/12/22 1107  ?Secured By [employer] 02/12/22 1107  ?Tube Holder Repositioned Yes 02/12/22 1107  ?Prone position No 02/12/22 1107  ?Cuff Pressure (cm H2O) Green OR 18-26 [facility] 02/12/22 1107  ?Site Condition Dry 02/12/22 1107  ? ?Vent end date: 02/12/22 Vent end time: 1335  ? ?Evaluation ? O2 sats: stable throughout ?Complications: No apparent complications ?Patient did tolerate procedure well. ?Bilateral Breath Sounds: Clear, Diminished ?  ?Yes (grunting, aphasia) ? ?Pt extubated to 4l [place] with RN at bedside and is tolerating well. Positive cuff leak noted. RT will continue to monitor. ? ? ?Forest Becker ?02/12/2022, 1:47 PM ? ?

## 2022-02-12 NOTE — Progress Notes (Signed)
SLP Cancellation Note ? ?Patient Details ?Name: [name] ?MRN: [id number] ?DOB: [birth year] ? ? ?Cancelled treatment:       Reason Eval/Treat Not Completed: Patient not medically ready (Pt currently on the vent. SLP will follow up.) ? ?Kynzlie Hilleary I. Vear Clock, MS, CCC-SLP ?Acute Rehabilitation Services ?Office number [phone] ?Pager [phone] ? ?Scheryl Marten ?02/12/2022, 10:11 AM ?

## 2022-02-12 NOTE — Progress Notes (Signed)
Echo attempted. Patient going to CT per nurse. Will attempt again later ?

## 2022-02-12 NOTE — Progress Notes (Signed)
Pt placed on PS/CPAP 5/5 on 40% and is tolerating well. RT will continue to monitor. °

## 2022-02-12 NOTE — Progress Notes (Signed)
Initial Nutrition Assessment ? ?DOCUMENTATION CODES:  ? ?Not applicable ? ?INTERVENTION:  ? ?Tube Feeds via Cortrak: ?Start Osmolite 1.5 @ 60 mL/hr (1440 mL/day)  ?45 mL ProSource TF - BID ?Provides 2240 kcal, 112 gram of protein, 1097 mL free water daily.  ? ?NUTRITION DIAGNOSIS:  ? ?Inadequate oral intake related to inability to eat as evidenced by NPO status. ? ?GOAL:  ? ?Patient will meet greater than or equal to 90% of their needs ? ?MONITOR:  ? ?Diet advancement, Labs, Weight trends, TF tolerance ? ?REASON FOR ASSESSMENT:  ? ?Consult (Cortrak) ?Assessment of nutrition requirement/status, Enteral/tube feeding initiation and management ? ?ASSESSMENT:  ? ?61 y.o. male presented to the [facility] with R side weakness, facial droop, and slurred speech. No significant PMH.  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  ? ?4/13 - intubated ?4/14 - Cortrak placed (tip gastric); extubated  ? ?Pt was just extubated. Family was present in room and provided diet history. ? ?Family reports that pt has not had any changes with his appetite and was eating great at home. Reports that he ate whatever he want and did not follow any restrictions. ? ?Family denies any recent weight loss. States that he likes to hunt and is outdoors. ? ?Discussed the Cortrak and providing all pt needs until he is able to eat by mouth safely.  ?Plan to start TF tonight due to being at risk for being reintubated.  ? ?Family with no other questions or concerns at this time.  ? ?Medications reviewed and include: Protonix ?Labs reviewed: Hgb A1c 6% ? ?NUTRITION - FOCUSED PHYSICAL EXAM: ? ?Flowsheet Row Most Recent Value  ?Orbital Region No depletion  ?Upper Arm Region No depletion  ?Thoracic and Lumbar Region No depletion  ?Buccal Region No depletion  ?Temple Region No depletion  ?Clavicle Bone Region No depletion  ?Clavicle and Acromion Bone Region No depletion  ?Scapular Bone Region No depletion  ?Dorsal Hand No depletion  ?Patellar  Region No depletion  ?Anterior Thigh Region No depletion  ?Posterior Calf Region No depletion  ?Edema (RD Assessment) None  ?Hair Reviewed  ?Eyes Reviewed  ?Mouth Reviewed  ?Skin Reviewed  ?Nails Reviewed  ? ?  ? ? ?Diet Order:   ?Diet Order   ? ?       ?  Diet NPO time specified  Diet effective now       ?  ? ?  ?  ? ?  ? ? ?EDUCATION NEEDS:  ? ?Not appropriate for education at this time ? ?Skin:  Skin Assessment: Reviewed RN Assessment ? ?Last BM:  PTA ? ?Height:  ?Ht Readings from Last 1 Encounters:  ?02/11/22 5\' 6"  (1.676 m)  ? ?Weight:  ?Wt Readings from Last 1 Encounters:  ?02/11/22 107 kg  ? ?BMI:  Body mass index is 38.08 kg/m?. ? ?Estimated Nutritional Needs:  ?Kcal:  2200-2400 ?Protein:  110-125 grams ?Fluid:  >/= 2.2 L ? ? ? ?Hermina Barters RD, LDN ?Clinical Dietitian ?See AMiON for contact information.  ? ?

## 2022-02-12 NOTE — Procedures (Signed)
Cortrak  Person Inserting Tube:  Sarkis Rhines L, RD Tube Type:  Cortrak - 43 inches Tube Size:  10 Tube Location:  Right nare Secured by: Bridle Technique Used to Measure Tube Placement:  Marking at nare/corner of mouth Cortrak Secured At:  65 cm   Cortrak Tube Team Note:  Consult received to place a Cortrak feeding tube.   X-ray is required, abdominal x-ray has been ordered by the Cortrak team. Please confirm tube placement before using the Cortrak tube.   If the tube becomes dislodged please keep the tube and contact the Cortrak team at [email or web address] (password [email or web address]) for replacement.  If after hours and replacement cannot be delayed, place a NG tube and confirm placement with an abdominal x-ray.    Venson Ferencz RD, LDN Clinical Dietitian See AMiON for contact information.    

## 2022-02-12 NOTE — Progress Notes (Signed)
OT Cancellation Note ? ?Patient Details ?Name: Gordon Fisher ?MRN: [id number] ?DOB: 1961-10-07 ? ? ?Cancelled Treatment:    Reason Eval/Treat Not Completed: Active bedrest order;Patient not medically ready ? ?Gordon Fisher,HILLARY ?02/12/2022, 8:02 AM ?.hil ?

## 2022-02-12 NOTE — Progress Notes (Signed)
?  Transition of Care (TOC) Screening Note ? ? ?Patient Details  ?Name: Gordon Fisher ?Date of Birth: 1961/05/09 ? ? ?Transition of Care [facility]) CM/SW Contact:    ?Glennon Mac, RN ?Phone Number: ?02/12/2022, 4:54 PM ? ? ? ?Transition of Care Department [facility]) has reviewed patient and no TOC needs have been identified at this time. We will continue to monitor patient advancement through interdisciplinary progression rounds. If new patient transition needs arise, please place a TOC consult. ? ?Quintella Baton, RN, BSN  ?Trauma/Neuro ICU Case Manager ?[phone] ? ?

## 2022-02-12 NOTE — Addendum Note (Signed)
Addendum  created 02/12/22 1610 by Mellody Dance, MD  ? Clinical Note Signed  ?  ?

## 2022-02-12 NOTE — TOC CAGE-AID Note (Signed)
Transition of Care (TOC) - CAGE-AID Screening ? ? ?Patient Details  ?Name: Gordon Fisher ?MRN: [id number] ?Date of Birth: [birth year] ? ?Transition of Care (TOC) CM/SW Contact:    ?Essence Merle C Tarpley-Carter, LCSWA ?Phone Number: ?02/12/2022, 12:58 PM ? ? ?Clinical Narrative: ?Pt is unable to participate in Cage Aid. ?CSW will attempt to assess at a better time. ? ?Insurance underwriter, MSW, LCSW-A ?Pronouns:  She/Her/Hers ?[facility]Transitions of Care ?Clinical Social Worker ?Direct Number:  [phone] ?[email or web address] ? ?CAGE-AID Screening: ?Substance Abuse Screening unable to be completed due to: : Patient unable to participate ? ?  ?  ?  ?  ?  ? ?  ? ?  ? ? ? ? ? ? ?

## 2022-02-13 ENCOUNTER — Inpatient Hospital Stay (HOSPITAL_COMMUNITY): Payer: 59

## 2022-02-13 DIAGNOSIS — I63512 Cerebral infarction due to unspecified occlusion or stenosis of left middle cerebral artery: Secondary | ICD-10-CM | POA: Diagnosis not present

## 2022-02-13 LAB — BASIC METABOLIC PANEL
Anion gap: 6 (ref 5–15)
BUN: 15 mg/dL (ref 6–20)
CO2: 21 mmol/L — ABNORMAL LOW (ref 22–32)
Calcium: 8.2 mg/dL — ABNORMAL LOW (ref 8.9–10.3)
Chloride: 106 mmol/L (ref 98–111)
Creatinine, Ser: 1.01 mg/dL (ref 0.61–1.24)
GFR, Estimated: >60 mL/min (ref >60)
Glucose, Bld: 131 mg/dL — ABNORMAL HIGH (ref 70–99)
Potassium: 3.8 mmol/L (ref 3.5–5.1)
Sodium: 133 mmol/L — ABNORMAL LOW (ref 135–145)

## 2022-02-13 LAB — CBC
HCT: 39.3 % (ref 39.0–52.0)
Hemoglobin: 13.8 g/dL (ref 13.0–17.0)
MCH: 31.9 pg (ref 26.0–34.0)
MCHC: 35.1 g/dL (ref 30.0–36.0)
MCV: 90.8 fL (ref 80.0–100.0)
Platelets: 155 10*3/uL (ref 150–400)
RBC: 4.33 MIL/uL (ref 4.22–5.81)
RDW: 12.2 % (ref 11.5–15.5)
WBC: 12.6 10*3/uL — ABNORMAL HIGH (ref 4.0–10.5)
nRBC: 0 % (ref 0.0–0.2)

## 2022-02-13 LAB — GLUCOSE, CAPILLARY
Glucose-Capillary: 186 mg/dL — ABNORMAL HIGH (ref 70–99)
Glucose-Capillary: 184 mg/dL — ABNORMAL HIGH (ref 70–99)
Glucose-Capillary: 145 mg/dL — ABNORMAL HIGH (ref 70–99)
Glucose-Capillary: 155 mg/dL — ABNORMAL HIGH (ref 70–99)
Glucose-Capillary: 137 mg/dL — ABNORMAL HIGH (ref 70–99)

## 2022-02-13 IMAGING — DX DG CHEST 1V PORT
1 series · 1 of 1 positions shown · non-contrast
Comparison: [DATE]

CLINICAL DATA: Pre MRI. History of acute ischemic left MCA infarct.

EXAM:
PORTABLE CHEST 1 VIEW

[chest ap]
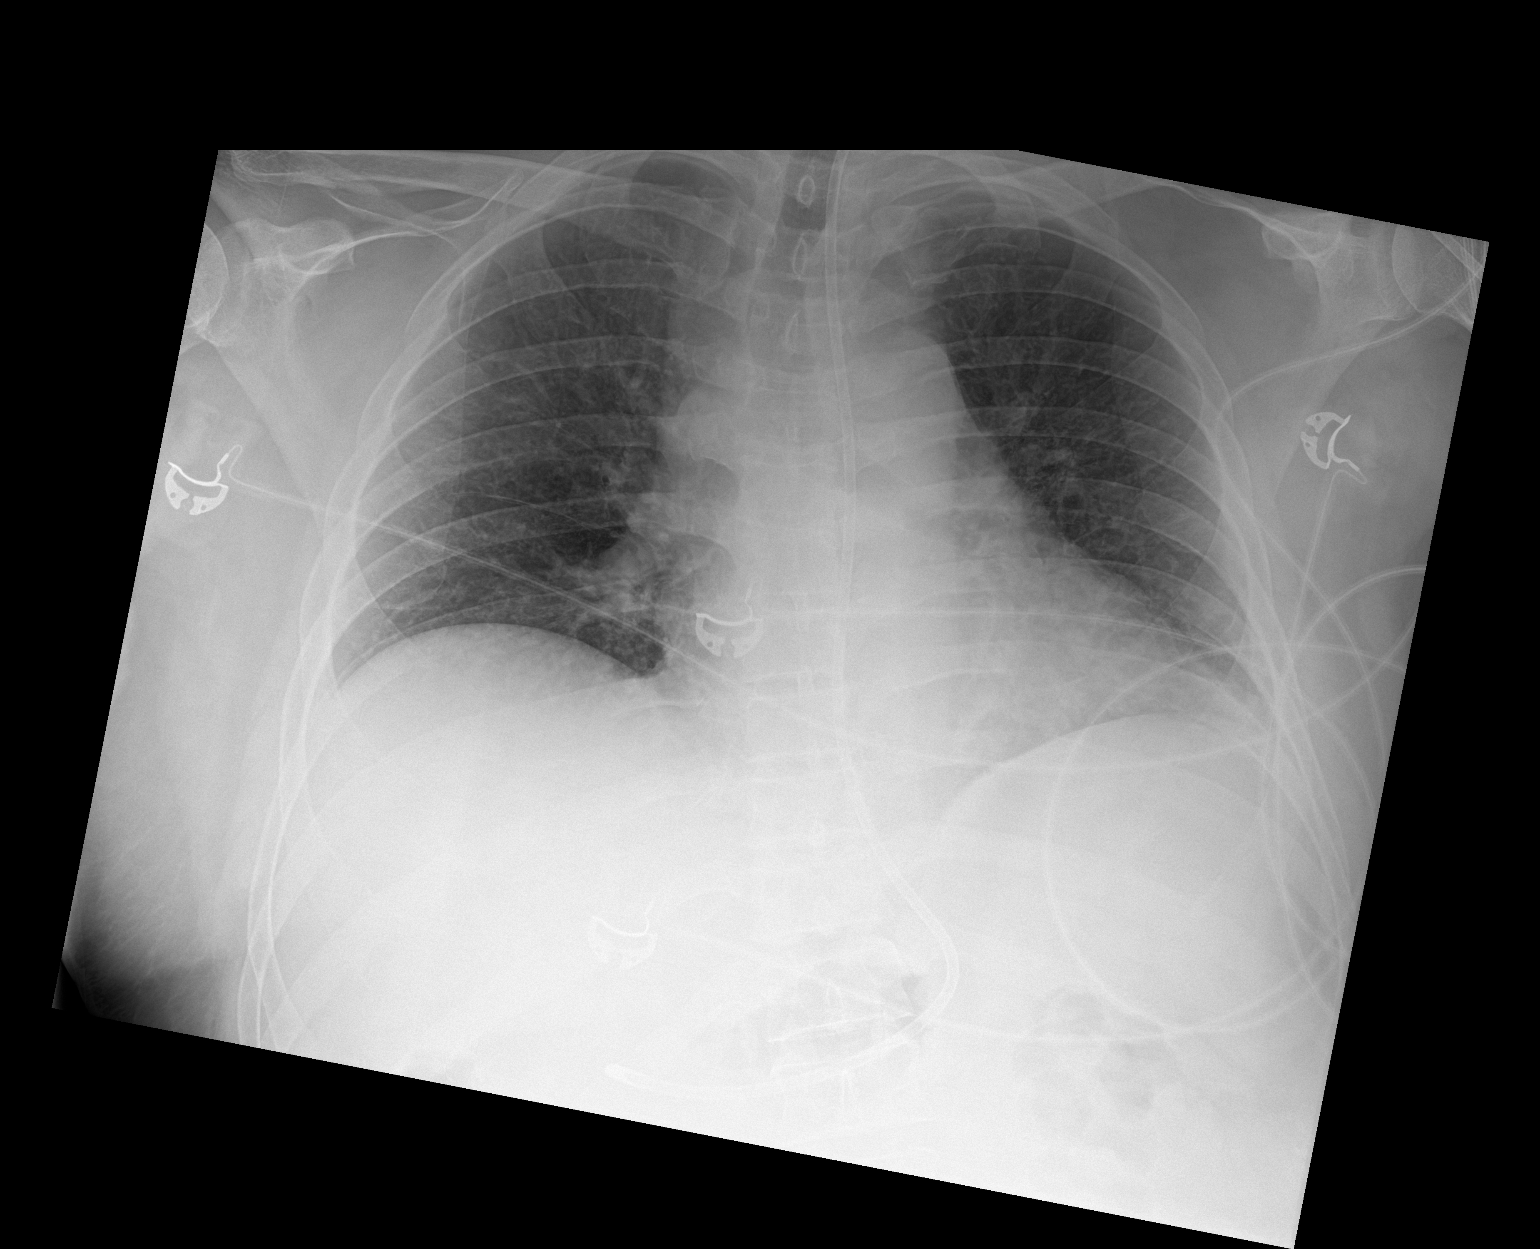

[1 of 1 positions shown; findings below may reference images not displayed]

FINDINGS: There is a feeding tube with tip in the projection of the distal
stomach or proximal duodenum. Normal heart size. Subsegmental
atelectasis noted in the left base. Interval improvement in
pulmonary vascular congestion.
IMPRESSION: 1. Improvement in pulmonary vascular congestion.
2. Feeding tube tip is in the distal stomach or proximal duodenum.

## 2022-02-13 MED ORDER — INSULIN ASPART 100 UNIT/ML IJ SOLN
0.0000 [IU] | INTRAMUSCULAR | Status: DC
  Administered 2022-02-13 (×2): 2 [IU] via SUBCUTANEOUS
  Administered 2022-02-13 – 2022-02-14 (×2): 3 [IU] via SUBCUTANEOUS
  Administered 2022-02-14: 2 [IU] via SUBCUTANEOUS
  Administered 2022-02-14 (×4): 3 [IU] via SUBCUTANEOUS
  Administered 2022-02-15: 2 [IU] via SUBCUTANEOUS
  Administered 2022-02-15: 3 [IU] via SUBCUTANEOUS
  Administered 2022-02-15: 2 [IU] via SUBCUTANEOUS
  Administered 2022-02-15 – 2022-02-16 (×4): 3 [IU] via SUBCUTANEOUS
  Administered 2022-02-16: 5 [IU] via SUBCUTANEOUS
  Administered 2022-02-16: 3 [IU] via SUBCUTANEOUS
  Administered 2022-02-16: 2 [IU] via SUBCUTANEOUS
  Administered 2022-02-16 – 2022-02-17 (×7): 3 [IU] via SUBCUTANEOUS
  Administered 2022-02-18 (×2): 5 [IU] via SUBCUTANEOUS
  Administered 2022-02-18: 3 [IU] via SUBCUTANEOUS

## 2022-02-13 MED ORDER — PANTOPRAZOLE 2 MG/ML SUSPENSION
40.0000 mg | Freq: Every day | ORAL | Status: DC
  Administered 2022-02-14 – 2022-02-18 (×5): 40 mg
  Filled 2022-02-13 (×5): qty 20

## 2022-02-13 MED ORDER — LORAZEPAM 2 MG/ML IJ SOLN
2.0000 mg | Freq: Once | INTRAMUSCULAR | Status: AC
  Administered 2022-02-14: 2 mg via INTRAVENOUS
  Filled 2022-02-13: qty 1

## 2022-02-13 MED ORDER — LABETALOL HCL 5 MG/ML IV SOLN: 5.0000 mg | INTRAVENOUS | Status: DC | PRN

## 2022-02-13 NOTE — Progress Notes (Signed)
Inpatient Rehab Admissions Coordinator:  ? ?Per therapy recommendations, patient was screened for CIR candidacy by Kayceon Oki, MS, CCC-SLP. At this time, Pt. is not yet at a level where I believe he could tolerate the intensity of CIR; however,  Pt. may have potential to progress to becoming a potential CIR candidate, so CIR admissions team will follow and monitor for progress and participation with therapies and place consult order if Pt. appears to be an appropriate candidate. Please contact me with any questions.  ? ?Gordon Sulkowski, MS, CCC-SLP ?Rehab Admissions Coordinator  ?[phone] (celll) ?[phone] (office) ? ? ?

## 2022-02-13 NOTE — Progress Notes (Signed)
? ? ?Referring Physician(s): ?Dr Lavera Guise ? ?Supervising Physician: Pedro Earls ? ?Patient Status:  [facility] - In-pt ? ?Chief Complaint: ? ?CVA ?L MCA thrombectomy 02/11/22 am ?Several  hours later---new rt sided weakness; aphasia ?New L MCA thrombectomy with rescue stenting 02/11/22 1000 pm ? ?Subjective: ? ?Am 4/13: Partially successful mechanical thrombectomy performed for distal ?left M1/MCA reocclusion with rescue stenting of the left MCA ?posterior division branch with intra procedural stent construct ?occlusion. The M1/MCA, anterior temporal artery and anterior ?division branch remains recanalized (TICI 2A). ? ?PM 4/13: Partially successful mechanical thrombectomy performed for distal ?left M1/MCA reocclusion with rescue stenting of the left MCA ?posterior division branch with intra procedural stent construct ?occlusion. The M1/MCA, anterior temporal artery and anterior ?division branch remains recanalized (TICI 2A). ? ?CT 4/14: IMPRESSION: ?1. Redemonstrated acute left MCA territory infarcts. Since the most ?recent prior head CT of 02/11/2022, there has been slight further ?demarcation of these infarcts (for instance at the left insula). ?2. Persistent small-volume hyperdensity along the posterior aspect ?of the left Sylvian  ? ?2 procedures performed in NIR 02/11/22 ?Pt has been extubated ?Up in bed ?Rt side with movement ?Does not follow command ?Left no movement ? ?Allergies: ?Patient has no known allergies. ? ?Medications: ?Prior to Admission medications   ?Medication Sig Start Date End Date Taking? Authorizing Provider  ?Calcium Carbonate Antacid (TUMS PO) Take 1-2 tablets by mouth daily as needed (acid reflux/indigestion).   Yes [provider]  ? ? ? ?Vital Signs: ?BP 128/80   Pulse 73   Temp 99.9 ?F (37.7 ?C) (Axillary)   Resp 16   Ht 5\' 6"  (1.676 m)   Wt 235 lb 14.3 oz (107 kg)   SpO2 93%   BMI 38.07 kg/m?  ? ?Physical Exam ?Vitals reviewed.  ?Musculoskeletal:  ?   Comments:  Rt with some movement ?Seems to follow command minimally ?Left no movement noted ?  ?Skin: ?   Comments: Rt groin site clean and dry ?No bleeding or hematoma ? ?Rt foot pulse intact ?  ?Neurological:  ?   Comments: Answers yes to everything-- not coherently  ? ? ?Imaging: ?CT ANGIO HEAD NECK W WO CM ? ?Result Date: 02/11/2022 ?CLINICAL DATA:  Code stroke. 61 year old male with sudden onset right side weakness. EXAM: CT ANGIOGRAPHY HEAD AND NECK TECHNIQUE: Multidetector CT imaging of the head and neck was performed using the standard protocol during bolus administration of intravenous contrast. Multiplanar CT image reconstructions and MIPs were obtained to evaluate the vascular anatomy. Carotid stenosis measurements (when applicable) are obtained utilizing NASCET criteria, using the distal internal carotid diameter as the denominator. RADIATION DOSE REDUCTION: This exam was performed according to the departmental dose-optimization program which includes automated exposure control, adjustment of the mA and/or kV according to patient size and/or use of iterative reconstruction technique. CONTRAST:  52mL OMNIPAQUE IOHEXOL 350 MG/ML SOLN COMPARISON:  Plain head CT 0659 hours. FINDINGS: CTA NECK Skeleton: No acute osseous abnormality identified. Ordinary cervical spine degeneration. Upper chest: Atelectasis and mediastinal lipomatosis. Other neck: Small volume retained secretions in the pharynx. Otherwise negative. Aortic arch: 3 vessel arch configuration. Minimal arch atherosclerosis. Right carotid system: Tortuous brachiocephalic artery without plaque or stenosis. Minor soft and calcified plaque at the right carotid bifurcation without stenosis. Left carotid system: Mild tortuosity and carotid bifurcation plaque with no stenosis. Vertebral arteries: Negative.  Mildly dominant left vertebral artery. CTA HEAD Posterior circulation: Minimal right V4 calcified plaque without stenosis. Dominant left V4 segment. Normal  PICA  origins and vertebrobasilar junction. Patent basilar artery without stenosis. Patent SCA and PCA origins. Posterior communicating arteries are diminutive or absent. PCA branches are within normal limits. Anterior circulation: Both ICA siphons are patent. Mild to moderate calcified plaque primarily in the supraclinoid segments. Mild supraclinoid stenosis results greater on the left. Patent carotid termini, MCA and ACA origins. Anterior communicating artery and ACA branches are within normal limits. Right MCA M1 segment and bifurcation are patent without stenosis. Right MCA branches are within normal limits. Left MCA origin is patent but the left M1 is occluded about 12 mm distal to the origin (series 7, image 97). The left anterior temporal artery origin is patent proximal to the occlusion and there is good reconstitution of left MCA branches Venous sinuses: Patent. Anatomic variants: Dominant left vertebral artery. Review of the MIP images confirms the above findings IMPRESSION: 1. 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 This was discussed by telephone with Dr. Delora Fuel on [id number] at 0711 hours. Anterior temporal artery remains patent and there is robust MCA branch collateral enhancement. 2. Generally mild for age atherosclerosis, most pronounced in the supraclinoid ICA siphons with calcified plaque. No other significant arterial stenosis. Electronically Signed   By: Genevie Ann M.D.   On: 02/11/2022 07:20  ? ?CT HEAD WO CONTRAST (5MM) ? ?Result Date: 02/12/2022 ?CLINICAL DATA:  Provided history: Neuro deficit, acute, stroke suspected. 24 hours post TNKase. EXAM: CT HEAD WITHOUT CONTRAST TECHNIQUE: Contiguous axial images were obtained from the base of the skull through the vertex without intravenous contrast. RADIATION DOSE REDUCTION: This exam was performed according to the departmental dose-optimization program which includes automated exposure control, adjustment of the mA and/or kV according  to patient size and/or use of iterative reconstruction technique. COMPARISON:  Prior head CT examinations performed 02/11/2022. CT angiogram head/neck 02/11/2022. Brain MRI 02/11/2022. FINDINGS: Brain: Cerebral volume is normal. Redemonstrated known acute left MCA territory infarcts within the left frontal, parietal and temporal lobes as well as left insula and left basal ganglia. Since the most recent prior head CT of 02/11/2022, there has been slight further demarcation of these infarcts (for instance at the left insula). As before, there is superimposed contrast staining within the left basal ganglia. No significant mass effect at this time. Persistent small-volume hyperdensity along the posterior aspect of the left sylvian fissure, likely reflecting subarachnoid hemorrhage. No evidence of an intracranial mass. No midline shift. Vascular: Redemonstrated stents within proximal left MCA vessels. Atherosclerotic calcifications. Skull: Normal. Negative for fracture or focal lesion. Sinuses/Orbits: Visualized orbits show no acute finding. Mild mucosal thickening and fluid scattered within the bilateral ethmoid sinuses. Mild mucosal thickening within the right sphenoid sinus. Moderate/severe mucosal thickening within the left sphenoid sinus with associated chronic reactive osteitis. Trace mucosal thickening within the right maxillary sinus at the imaged levels. Trace mucosal thickening and small fluid level within the left maxillary sinus at the imaged levels. IMPRESSION: 1. Redemonstrated acute left MCA territory infarcts. Since the most recent prior head CT of 02/11/2022, there has been slight further demarcation of these infarcts (for instance at the left insula). 2. Persistent small-volume hyperdensity along the posterior aspect of the left Sylvian fissure, likely reflecting acute subarachnoid hemorrhage. 3. Paranasal sinus disease, as described. Electronically Signed   By: Kellie Simmering D.O.   On: 02/12/2022 09:32   ? ?CT HEAD WO CONTRAST ? ?Result Date: 02/11/2022 ?CLINICAL DATA:  Follow-up examination for stroke, status post TNK and mechanical thrombectomy. EXAM: CT HEAD  WITHOUT CONTRAST TECHNIQUE: Contiguous axial image

## 2022-02-13 NOTE — Progress Notes (Addendum)
Occupational Therapy Evaluation ?Late Entry ? 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 Pt currently requiring Max-Total A for ADLs, bed mobility, and sit<>stand from EOB. Pt following majority of one step commands with visual/tactile cues; perseverating on certain motor movements. Pt with decreased sitting balance at EOB, but able to achieve balance with close Min guard A - consistently lateral lean to R and initially able correct with Max cue. Pt very motivated and has good family support .Pt will require further acute OT to facilitate safe dc. Recommend dc to AIR for further OT to optimize safety, independence with ADLs, and return to PLOF.  ? ? 02/12/22 1700  ?OT Visit Information  ?Last OT Received On 02/12/22  ?Assistance Needed +2  ?PT/OT/SLP Co-Evaluation/Treatment Yes  ?Reason for Co-Treatment For patient/therapist safety  ?OT goals addressed during session ADL's and self-care  ?History of Present Illness 61 y.o. male presents to [facility] hospital on 02/11/2022 with R weakness, facial droop and slurred speech. CTH dmonstrates L MCA. Pt given tNK and underwent thrombectomy on 4/13 with revascularization. Pt with recurrence of symptoms, returned to IR with stent placement x2 however stent became occluded. Pt remained intubated after procedure. No significant PMH noted.  ?Precautions  ?Precautions Fall  ?Precaution Comments R inattention  ?Restrictions  ?Weight Bearing Restrictions No  ?Home Living  ?Family/patient expects to be discharged to: Private residence  ?Living Arrangements Spouse/significant other  ?Available Help at Discharge Family;Available 24 hours/day  ?Type of Home House  ?Home Access Stairs to enter  ?Entrance Stairs-Number of Steps 5+3  ?Entrance Stairs-Rails Right;Left  ?Home Layout Two level;Able to live on main level with bedroom/bathroom  ?Bathroom Shower/Tub Walk-in shower  ?Bathroom Toilet Standard  ?Home Equipment None  ?Prior Function  ?Prior Level of  Function  Independent/Modified Independent  ?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 Pt does perseverate on cues for UE movement, inconsistently following during session  ?Difficult to assess due to Impaired communication  ?Upper Extremity Assessment  ?Upper Extremity Assessment RUE deficits/detail  ?RUE Deficits / Details Noting tone in RUE. No active movement  ?RUE Coordination decreased fine motor;decreased gross motor  ?Lower Extremity Assessment  ?Lower Extremity Assessment Defer to PT evaluation  ?RLE Deficits / Details flaccid, no AROM noted. PROM WFL  ?LLE Deficits / Details performs SLR and able to hold against gravity, difficult to formally assess due to impaird communication  ?Cervical / Trunk Assessment  ?Cervical / Trunk Assessment Normal  ?Vision- History  ?Baseline Vision/History 1 Wears glasses ?(reading)  ?Vision- Assessment  ?Vision Assessment? Yes  ?Ocular Range of Motion Restricted on the right  ?Visual Fields Right visual field deficit  ?Additional Comments Unable to performing tracking to R past midline. Achieving head turns to R with Mod cues  ?Perception  ?Perception Tested? Yes  ?Perception Deficits Inattention/neglect  ?Inattention/Neglect Does not attend to right visual field;Does not attend to right side of body  ?ADL  ?Overall ADL's  Needs assistance/impaired  ?Eating/Feeding NPO  ?Grooming Maximal assistance  ?Grooming Details (indicate cue type and  reason) Max hand over  hand for bringing wash clothe to face.  ?General ADL Comments Max A-Total A for ADLs  ?Bed Mobility  ?Overal bed mobility Needs Assistance  ?Bed Mobility Supine to Sit;Sit to Supine  ?Supine to sit +2 for physical assistance;Max assist  ?Sit to supine Total assist;+2 for physical assistance  ?Transfers  ?Overall transfer level Needs assistance  ?Equipment used 2 person hand held assist  ?Transfers Sit to/from Stand  ?Sit to Stand Max assist;+2 physical assistance  ?General transfer comment R knee block, R knee buckles with first attempt without solid block. Pt leaning to right side  ?Balance  ?Overall balance assessment Needs assistance  ?Sitting-balance support Single extremity supported;Feet supported  ?Sitting balance-Leahy Scale Poor  ?Sitting balance - Comments max-totalA initially with posterior and R lateral lean. Pt improves with time, progressing to minG-minA without distraction. With distraction or 2nd task added the pt does fall to right. Demonstrates awareness to attempt to correct balance but requires assistance  ?Postural control Right lateral lean  ?Standing balance support Bilateral upper extremity supported  ?Standing balance-Leahy Scale Zero  ?Standing balance comment maxA x 2, R knee block  ?General Comments  ?General comments (skin integrity, edema, etc.) VSS on RA, daughter arriving at end of session  ?OT - End of Session  ?Activity Tolerance Patient tolerated treatment well  ?Patient left in bed;with call bell/phone within reach;with family/visitor present  ?Nurse Communication Mobility status  ?OT Assessment  ?OT Recommendation/Assessment Patient needs continued OT Services  ?OT Visit Diagnosis Unsteadiness on feet (R26.81);Other abnormalities of gait and mobility (R26.89);Muscle weakness (generalized) (M62.81)  ?OT Problem List Decreased strength;Decreased range of motion;Decreased activity tolerance;Impaired UE functional use;Decreased knowledge of precautions;Decreased knowledge of use of  DME or AE;Decreased cognition;Impaired vision/perception;Impaired balance (sitting and/or standing);Decreased coordination  ?OT Plan  ?OT Frequency (ACUTE ONLY) Min 2X/week  ?OT Treatment/Interventions (ACUTE ONLY) Self-care/ADL training;Therapeutic exercise;Neuromuscular education;Energy conservation;DME and/or AE instruction;Therapeutic activities;Patient/family education;Cognitive remediation/compensation;Visual/perceptual remediation/compensation  ?AM-PAC OT "6 Clicks" Daily Activity Outcome Measure (Version 2)  ?Help from another person eating meals? 1  ?Help from another person taking care of personal grooming? 2  ?Help from another person toileting, which includes using toliet, bedpan, or urinal? 1  ?Help from another person bathing (including washing, rinsing, drying)? 2  ?Help from another person to put on and taking off regular upper body clothing? 2  ?Help from another person to put on and taking off regular lower body clothing? 1  ?6 Click Score 9  ?Progressive Mobility  ?What is the highest level of mobility based on the progressive mobility assessment? Level 1 (Bedfast) - Unable to balance while sitting on edge of bed  ?Activity Stood at bedside ?(significant assistance of 2 persons, recommend bed-level only for nursing staff at this time)  ?OT Recommendation  ?Recommendations for Other Services PT consult;Speech consult;Rehab consult  ?Follow Up Recommendations Acute inpatient rehab (3hours/day)  ?Assistance recommended at discharge Frequent or constant Supervision/Assistance  ?Patient can return home with the following  ?(Total care)  ?Functional Status Assessent Patient has had a recent decline in their functional status and demonstrates the ability to make significant improvements in function in a reasonable and predictable amount of time.  ?OT Equipment Other (comment) ?(Defer to next venue)  ?Individuals Consulted  ?Consulted and Agree with Results and Recommendations Family member/caregiver   ?Family Member Consulted Daughter  ?Acute Rehab OT Goals  ?Patient Stated Goal Unstated  ?OT Goal Formulation Patient unable to participate in goal setting  ?Time For Goal  Achievement 02/26/22  ?Potential

## 2022-02-13 NOTE — Plan of Care (Signed)
?  Problem: Education: ?Goal: Knowledge of disease or condition will improve ?Outcome: Progressing ?Goal: Knowledge of secondary prevention will improve (SELECT ALL) ?Outcome: Progressing ?Goal: Knowledge of patient specific risk factors will improve (INDIVIDUALIZE FOR PATIENT) ?Outcome: Progressing ?Goal: Individualized Educational Video(s) ?Outcome: Progressing ?  ?Problem: Coping: ?Goal: Will verbalize positive feelings about self ?Outcome: Progressing ?Goal: Will identify appropriate support needs ?Outcome: Progressing ?  ?Problem: Health Behavior/Discharge Planning: ?Goal: Ability to manage health-related needs will improve ?Outcome: Progressing ?  ?Problem: Self-Care: ?Goal: Ability to participate in self-care as condition permits will improve ?Outcome: Progressing ?Goal: Verbalization of feelings and concerns over difficulty with self-care will improve ?Outcome: Progressing ?Goal: Ability to communicate needs accurately will improve ?Outcome: Progressing ?  ?Problem: Nutrition: ?Goal: Risk of aspiration will decrease ?Outcome: Progressing ?  ?Problem: Ischemic Stroke/TIA Tissue Perfusion: ?Goal: Complications of ischemic stroke/TIA will be minimized ?Outcome: Progressing ?  ?Problem: Spontaneous Subarachnoid Hemorrhage Tissue Perfusion: ?Goal: Complications of Spontaneous Subarachnoid Hemorrhage will be minimized ?Outcome: Progressing ?  ?Problem: Safety: ?Goal: Non-violent Restraint(s) ?Outcome: Progressing ?  ?

## 2022-02-13 NOTE — Progress Notes (Addendum)
STROKE TEAM PROGRESS NOTE  ? ?INTERVAL HISTORY ?Patient is seen in his room with no family at the bedside.  He was extubated yesterday.  He has been hemodynamically stable and is ready to transfer out of the ICU.  Plan is to obtain a follow-up MRI today. ? ?Vitals:  ? 02/13/22 0900 02/13/22 1000 02/13/22 1100 02/13/22 1200  ?BP: 120/72 120/80 130/82   ?Pulse: 70 75 82   ?Resp: 16 17 20    ?Temp:    99.4 ?F (37.4 ?C)  ?TempSrc:    Axillary  ?SpO2: 93% 94% 93%   ?Weight:      ?Height:      ? ?CBC:  ?Recent Labs  ?Lab 02/11/22 ?0654 02/11/22 ?2108 02/12/22 ?0421 02/13/22 ?02/15/22  ?WBC 7.8  --  15.3* 12.6*  ?NEUTROABS 2.8  --  10.4*  --   ?HGB 16.4   < > 15.3 13.8  ?HCT 48.8   < > 45.1 39.3  ?MCV 91.6  --  93.6 90.8  ?PLT 181  --  170 155  ? < > = values in this interval not displayed.  ? ? ?Basic Metabolic Panel:  ?Recent Labs  ?Lab 02/12/22 ?0421 02/13/22 ?0233  ?NA 135 133*  ?K 4.7 3.8  ?CL 107 106  ?CO2 19* 21*  ?GLUCOSE 123* 131*  ?BUN 14 15  ?CREATININE 1.00 1.01  ?CALCIUM 8.0* 8.2*  ? ? ?Lipid Panel:  ?Recent Labs  ?Lab 02/12/22 ?0421  ?CHOL 198  ?TRIG 146  ?HDL 47  ?CHOLHDL 4.2  ?VLDL 29  ?LDLCALC 122*  ? ? ?HgbA1c:  ?Recent Labs  ?Lab 02/11/22 ?0654  ?HGBA1C 6.0*  ? ? ?Urine Drug Screen:  ?Recent Labs  ?Lab 02/11/22 ?1608  ?LABOPIA NONE DETECTED  ?COCAINSCRNUR NONE DETECTED  ?LABBENZ NONE DETECTED  ?AMPHETMU NONE DETECTED  ?THCU NONE DETECTED  ?LABBARB NONE DETECTED  ? ?  ?Alcohol Level  ?Recent Labs  ?Lab 02/11/22 ?0654  ?ETH <10  ? ? ? ?IMAGING past 24 hours ?DG CHEST PORT 1 VIEW ? ?Result Date: 02/13/2022 ?CLINICAL DATA:  Pre MRI. History of acute ischemic left MCA infarct. EXAM: PORTABLE CHEST 1 VIEW COMPARISON:  02/11/2022 FINDINGS: There is a feeding tube with tip in the projection of the distal stomach or proximal duodenum. Normal heart size. Subsegmental atelectasis noted in the left base. Interval improvement in pulmonary vascular congestion. IMPRESSION: 1. Improvement in pulmonary vascular congestion. 2.  Feeding tube tip is in the distal stomach or proximal duodenum. Electronically Signed   By: 02/13/2022 M.D.   On: 02/13/2022 10:17   ? ?PHYSICAL EXAM ?General:  Well-developed, well-nourished middle-aged Caucasian male patient in no acute distress ?Respiratory:  Respirations regular and unlabored ? ?NEURO:  ?Mental Status: difficult to assess due to aphasia, unable to follow commands ?Speech/Language: Expressive and receptive aphasia present.  Patient is able to say "yes" but does not appear to understand what is being said ? ?Cranial Nerves:  ?II: PERRL.  ?III, IV, VI: EOMI. Eyelids elevate symmetrically.  ?VII: Right sided facial droop present ?VIII: hearing intact to voice. ?IX, X: Phonation is normal.  ?Motor: Able to move LUE and LLE spontaneously. RUE and RLE move to noxious stimuli ?Gait- deferred ? ? ?ASSESSMENT/PLAN ?Mr. FRANSISCO MESSMER is a 61 y.o. male with no significant medical history presenting with slurred speech and right sided weakness.  He was brought to [facility] and was given TNK then transferred here for thrombectomy.  Mechnaical thrombectomy was performed in the left  M1 MCA with TICI 3 flow achieved.  Unfortunately, later that day the left M1 MCA reoccluded.  Mechanical thrombectomy was performed again with recanalization achieved but dissection of the MCA occurred.  A stent was placed, but this unfortunately occluded and was unable to be recanalized.  Patient has remained intubated after the procedure and was extubated on 4/14.  He remains aphasic with right hemiplegia. ? 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 Mild HT in  left parietal lobe ?S/p IR x 2 with stent placement for left M2 dissection and then stent reocclusion ?Repeat CT head 4/14 evolving left MCA distribution infarct, small SAH, stenting of proximal left MCA branches ?Repeat MRI pending ?2D Echo EF 60-65%, interatrial septum not well visualized ?Will consider TEE for embolic work up ?hypercoagulable work up pending ?LDL 122 ?HgbA1c 6.0 ?VTE prophylaxis - SCDs ?No antithrombotic prior to admission, now on aspirin 81 mg daily and Brilinta (ticagrelor) 90 mg bid.  ?Therapy recommendations:  CIR ?Disposition:  pending ? ?Hypertension ?Home meds:  none ?Stable ?SBP <160 now ?Off cleviprex ?Long-term BP goal normotensive ? ?Hyperlipidemia ?Home meds:  none ?LDL 122, goal < 70 ?Add atorvastatin 40 mg  ?Continue statin at discharge ? ?Respiratory failure ?Patient left intubated after procedure ?Ventilator management per CCM ?Extubated 4/14 ? ?Other Stroke Risk Factors ?Obesity, Body mass index is 38.07 kg/m?., BMI >/= 30 associated with increased stroke risk, recommend weight loss, diet and exercise as appropriate  ? ? ?Other Active Problems ?none ? ?Hospital day # 2 ? ?Cortney E Ernestina Columbia , MSN, AGACNP-BC ?[facility] ?See Amion for schedule and pager information ?02/13/2022 3:07 PM ? ?ATTENDING NOTE: ?I reviewed above note and agree with the assessment and plan. Pt was seen and examined.  ? ?61 year old male with no significant past medical history admitted for right-sided weakness, right facial droop and slurred speech.  CT left MCA hyperdense sign.  Status post TNK.  CT head and neck left M1 occlusion.  Status post IR with TICI3.  MRI showed small left MCA territory scattered infarct.  However, patient later had worsening aphasia, back to IR suite showed left M2 proximal dissection with occlusion, status post stent but then developed stent occlusion at end of procedure.  CT repeat showed small left SAH and CT 4/14 showed left MCA territory infarct.  MRI repeat  pending.  EF 60 to 65%, LDL 122, A1c 6.0, UDS negative.  Creatinine is 1.00, WBC 15.3-12.6. ? ?On exam, awake, alert, eyes open, global aphasia, able to say "yah" for all questions but not following simple commands. No gaze palsy, tracking bilaterally, blinking to visual threat bilaterally, PERRL. Right facial droop. Tongue protrusion not cooperative. RUE and RLE spontaneous movement against gravity, LUE and LLE flaccid. Sensation, coordination not cooperative and gait not tested. ? ?Etiology for patient initial stroke still unclear.  Given his young age, will continue further embolic work-up including TEE and hypercoag work-up.  Currently n.p.o., on tube feeding.  Continue aspirin and Brilinta as well as statin.  PT/OT recommend CIR. ? ?For detailed assessment and plan, please refer to above as I have made changes  wherever appropriate.  ? ?Marvel PlanJindong Yariel Ferraris, MD PhD ?Stroke Neurology ?02/13/2022 ?9:28 PM ? ?This patient is critically ill due to left M1 occlusion, left MCA stroke, status post stenting with stent reocclusion and at significant risk of neurological worsening, death form recurrent stroke, hemorrhagic transformation. This patient's care requires constant monitoring of vital signs, hemodynamics, respiratory and cardiac monitoring, review of multiple databases, neurological assessment, discussion with family, other specialists and medical decision making of high complexity. I spent 35 minutes of neurocritical care time in the care of this patient. ? ? ? ?To contact Stroke Continuity provider, please refer to [email or web address]. ?After hours, contact General Neurology  ?

## 2022-02-13 NOTE — Progress Notes (Signed)
? ?NAME:  Gordon Fisher, MRN:  [id number], DOB:  Oct 07, 1961, LOS: 2 ?ADMISSION DATE:  02/11/2022 CONSULTATION DATE:  02/11/2022 ?REFERRING MD:  Stroke - EDP CHIEF COMPLAINT:  L MCA occlusion  ? ?History of Present Illness:  ?61 year old man who presented to [facility] ED via EMS 4/13 for R-sided neurologic deficits (R-sided weakness, R facial droop and slurred speech). No significant PMHx. ? ?Patient initially presented to [facility] ED via EMS after waking up with right-sided weakness and slurred speech.  He was evaluated at [facility] by Telestroke and CT Head was obtained demonstrating hyperdense L MCA.  Subsequent CTA demonstrated L MCA M1 occlusion.  TNK was administered at 0721. Patient was transferred to [facility] for NIR thrombectomy. ? ?Initial NIR procedure demonstrated occlusion of the distal left M1 MCA; mechanical thrombectomy was completed with TICI 3 recanalization. Post-procedure CT and MRI Brain were completed. Several hours postprocedure, patient experienced recurrence of neurologic deficits prompting return to NIR; procedure demonstrated distal L M1 occlusion prompting repeat thrombectomy and stent placement x 2. Stent unfortunately became occluded and was unable to be crossed. Patient returned to ICU with cangrelor gtt. ? ?PCCM consulted for vent/BP management. ? ?Pertinent Medical History:  ?History reviewed. No pertinent past medical history. ? ?Significant Hospital Events: ?Including procedures, antibiotic start and stop dates in addition to other pertinent events   ?4/13 Presented to [facility] via EMS with R-sided neurologic deficits. Evaluated by Telestroke, CT Head with hyperdense L MCA (ASPECTS 10). CTA L MCA M1 occlusion. Received TNK [id number]. NIR thrombectomy with TICI 3 revascularization. Subsequent MRI showed small areas of infarct in L MCA territory with mild hemorrhagic transformation ?4/15 Extubated yesterday, no overnight events, repeat head CT again shows L MCA infarcts, small hyperdensity in the L Sylvian  fissure, likely acute SAH ? ?Interim History / Subjective:  ?Extubated yesterday, no acute events overnight, slightly confused when woken up for exam this morning ? ?Objective:  ?Blood pressure 125/78, pulse 71, temperature 99.7 ?F (37.6 ?C), temperature source Axillary, resp. rate 20, height 5\' 6"  (1.676 m), weight 107 kg, SpO2 96 %. ?   ?Vent Mode: PSV;CPAP ?FiO2 (%):  [40 %] 40 % ?Set Rate:  [16 bmp] 16 bmp ?Vt Set:  [510 mL] 510 mL ?PEEP:  [5 cmH20] 5 cmH20 ?Pressure Support:  [5 cmH20] 5 cmH20  ? ?Intake/Output Summary (Last 24 hours) at 02/13/2022 0802 ?Last data filed at 02/13/2022 0743 ?Gross per 24 hour  ?Intake 2240.67 ml  ?Output 2175 ml  ?Net 65.67 ml  ? ? ?Filed Weights  ? 02/11/22 0712 02/13/22 0500  ?Weight: 107 kg 107 kg  ? ? ? ?General:  ill-appearing M, sleeping in bed in no acute distress  ?HEENT: MM pink/moist, sclera anicteric ?Neuro: sleepy on initial exam, spontaneously moving R side, but not to command ?CV: s1s2 rrr, no m/r/g ?PULM:  on [place], protecting airway, lungs clear bilaterally in no distress ?GI: soft, bsx4 active  ?Extremities: warm/dry, no edema  ?Skin: no rashes or lesions ? ? ? ? ?Labs reviewed  ?Glu 186 ?WBC 12k ?Na 133 ? ?Resolved Hospital Problem List:  ? ? ?Assessment & Plan:  ? ?CVA in setting of Left MCA M1 Occlusion ?S/p NIR thrombectomy x 2 with Reocclusion ?Small Hemorrhagic Conversion  ?[facility] 4/15 stable without further hemorrhage ?-management per Stroke Team  ?-Asa 81mg , Brilinta 90mg  bid per Neuro ?-SBP goal 120-140  ?-ECHO showed EF 60-65%, RV/RA enlarged with normal systolic function ?-neuroprotective measures > HOB elevated, avoid fever, glucose/oxygen disturbances, glucose  180's, start SSI ? ?Need for post-procedural mechanical ventilation ?Resolved ?-continue Sigurd ? ?At Risk Malnutrition  ?-speech eval today ? ? ?PCCM will sign off today, please re-consult for any pulmonary or critical care concerns ? ?Best Practice: (right click and "Reselect all SmartList Selections"  daily)  ?Diet/type: NPO, speech eval pending ?DVT prophylaxis: Per Stroke team (Primary) ?GI prophylaxis: PPI ?Lines: N/A ?Foley:  Yes, and it is still needed ?Code Status:  full code ?Last date of multidisciplinary goals of care discussion: per primary, 4/13 full code.  Wife relays that patient has never really gone to seek medical care, former [facility] & very independent.  She is concerned about his ability to recover but is hopeful for him.   ? ?Critical care time:   ? ? ? ?Otilio Carpen Nathanyl Andujo, PA-C ? Pulmonary & Critical care ?See Amion for pager ?If no response to pager , please call [phone] until 7pm ?After 7:00 pm call Elink  [phone] ? ? ?

## 2022-02-14 ENCOUNTER — Inpatient Hospital Stay (HOSPITAL_COMMUNITY): Payer: 59

## 2022-02-14 DIAGNOSIS — E7800 Pure hypercholesterolemia, unspecified: Secondary | ICD-10-CM

## 2022-02-14 DIAGNOSIS — I63512 Cerebral infarction due to unspecified occlusion or stenosis of left middle cerebral artery: Secondary | ICD-10-CM | POA: Diagnosis not present

## 2022-02-14 LAB — BASIC METABOLIC PANEL
Anion gap: 7 (ref 5–15)
BUN: 14 mg/dL (ref 6–20)
CO2: 23 mmol/L (ref 22–32)
Calcium: 8.5 mg/dL — ABNORMAL LOW (ref 8.9–10.3)
Chloride: 104 mmol/L (ref 98–111)
Creatinine, Ser: 0.94 mg/dL (ref 0.61–1.24)
GFR, Estimated: >60 mL/min (ref >60)
Glucose, Bld: 153 mg/dL — ABNORMAL HIGH (ref 70–99)
Potassium: 3.5 mmol/L (ref 3.5–5.1)
Sodium: 134 mmol/L — ABNORMAL LOW (ref 135–145)

## 2022-02-14 LAB — GLUCOSE, CAPILLARY
Glucose-Capillary: 141 mg/dL — ABNORMAL HIGH (ref 70–99)
Glucose-Capillary: 153 mg/dL — ABNORMAL HIGH (ref 70–99)
Glucose-Capillary: 157 mg/dL — ABNORMAL HIGH (ref 70–99)
Glucose-Capillary: 164 mg/dL — ABNORMAL HIGH (ref 70–99)
Glucose-Capillary: 164 mg/dL — ABNORMAL HIGH (ref 70–99)
Glucose-Capillary: 169 mg/dL — ABNORMAL HIGH (ref 70–99)
Glucose-Capillary: 177 mg/dL — ABNORMAL HIGH (ref 70–99)

## 2022-02-14 LAB — CBC
HCT: 44.5 % (ref 39.0–52.0)
Hemoglobin: 15.3 g/dL (ref 13.0–17.0)
MCH: 31.2 pg (ref 26.0–34.0)
MCHC: 34.4 g/dL (ref 30.0–36.0)
MCV: 90.8 fL (ref 80.0–100.0)
Platelets: 175 10*3/uL (ref 150–400)
RBC: 4.9 MIL/uL (ref 4.22–5.81)
RDW: 12.1 % (ref 11.5–15.5)
WBC: 10.3 10*3/uL (ref 4.0–10.5)
nRBC: 0 % (ref 0.0–0.2)

## 2022-02-14 LAB — FOLATE: Folate: 15.9 ng/mL (ref >5.9)

## 2022-02-14 LAB — RPR: RPR Ser Ql: NONREACTIVE

## 2022-02-14 LAB — TSH: TSH: 3.728 u[IU]/mL (ref 0.350–4.500)

## 2022-02-14 LAB — VITAMIN B12: Vitamin B-12: 238 pg/mL (ref 180–914)

## 2022-02-14 IMAGING — MR MR HEAD W/O CM
10 of 11 series · 43 of 48 positions shown · non-contrast
Comparison: Comparison made with prior CT from [DATE] as well
as previous exams.

CLINICAL DATA: Follow-up examination for acute stroke.

EXAM:
MRI HEAD WITHOUT CONTRAST
TECHNIQUE: Multiplanar, multiecho pulse sequences of the brain and surrounding
structures were obtained without intravenous contrast.

[Series 5: DWI · axial · 3.0mm · 0.88mm/px · z∈[-136,+16]mm · 10 of 104 slices shown (1 of 4)]
[im 1/104]
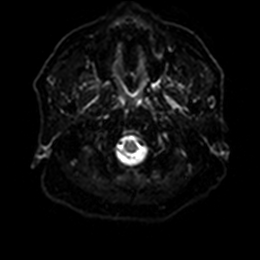
[im 12/104]
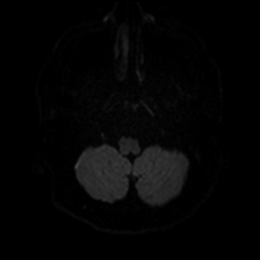
[im 23/104]
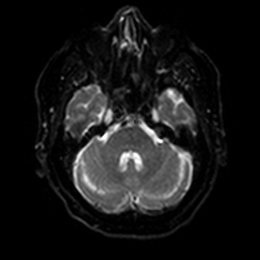
[im 35/104]
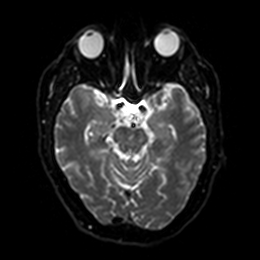
[im 46/104]
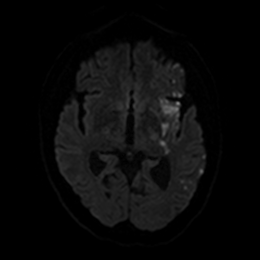
[im 58/104]
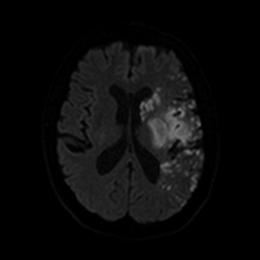
[im 69/104]
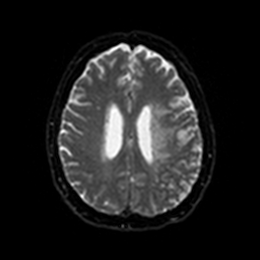
[im 81/104]
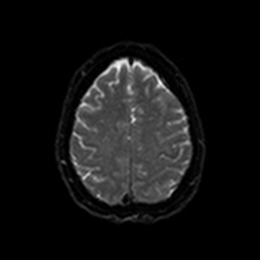
[im 92/104]
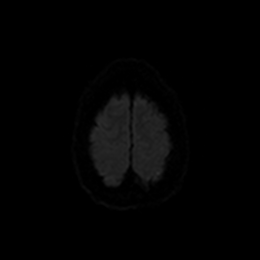
[im 104/104]
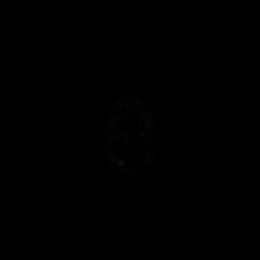

[Series 6: DWI · axial · 3.0mm · 0.88mm/px · z∈[-136,+16]mm · 5 of 52 slices shown (2 of 4)]
[im 1/52]
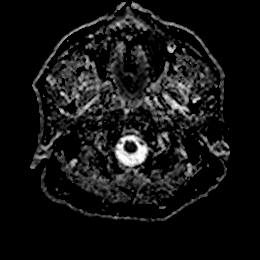
[im 13/52]
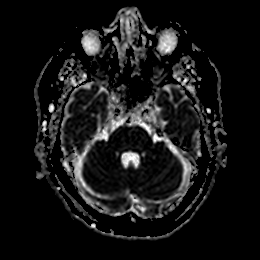
[im 26/52]
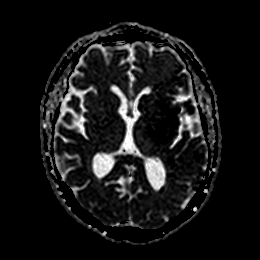
[im 39/52]
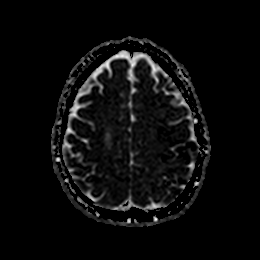
[im 52/52]
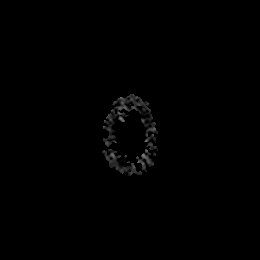

[Series 7: DWI · coronal · 4.0mm · 0.88mm/px · 6 of 64 slices shown (3 of 4)]
[im 1/64]
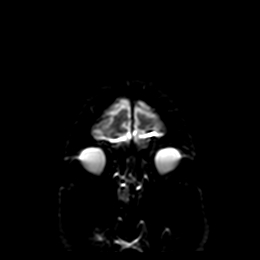
[im 13/64]
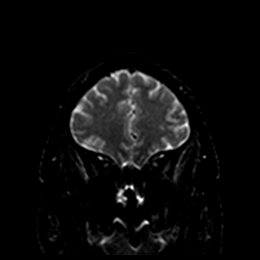
[im 26/64]
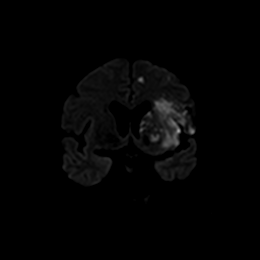
[im 38/64]
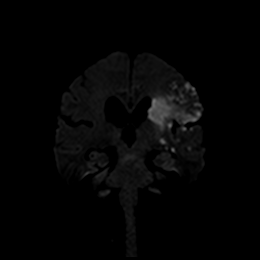
[im 51/64]
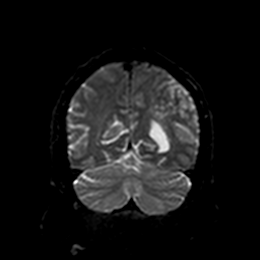
[im 64/64]
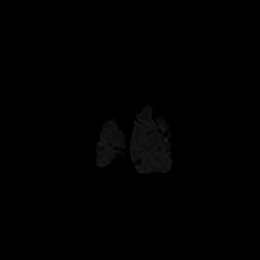

[Series 8: DWI · coronal · 4.0mm · 0.88mm/px · 3 of 32 slices shown (4 of 4)]
[im 1/32]
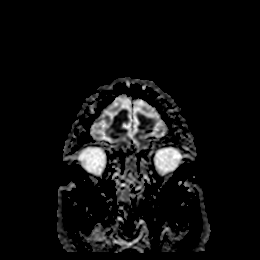
[im 16/32]
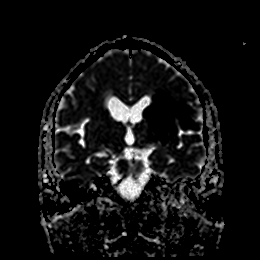
[im 32/32]
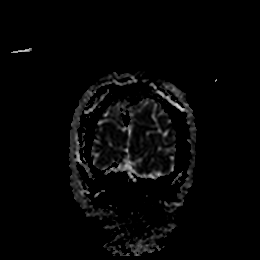

[Series 9: T1 · sagittal · 5.0mm · 0.78mm/px · 2 of 23 slices shown]
[im 1/23]
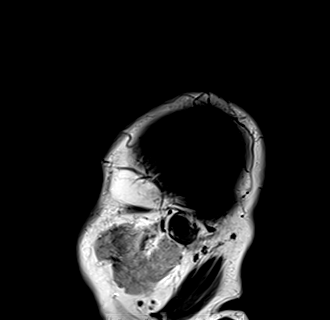
[im 23/23]
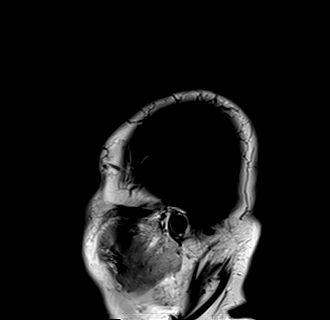

[Series 10: T2 · axial · 5.0mm · 0.72mm/px · z∈[-137,+19]mm · 2 of 27 slices shown (1 of 2)]
[im 1/27]
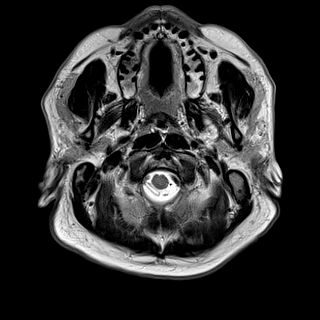
[im 27/27]
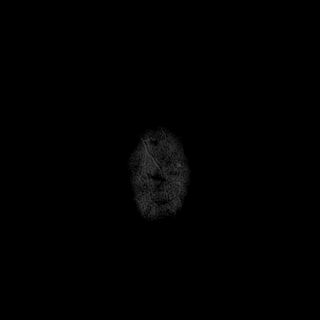

[Series 11: FLAIR · axial · 5.0mm · 0.45mm/px · z∈[-137,+18]mm · 2 of 27 slices shown]
[im 1/27]
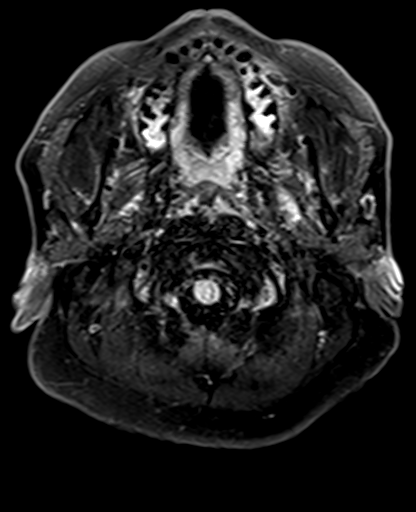
[im 27/27]
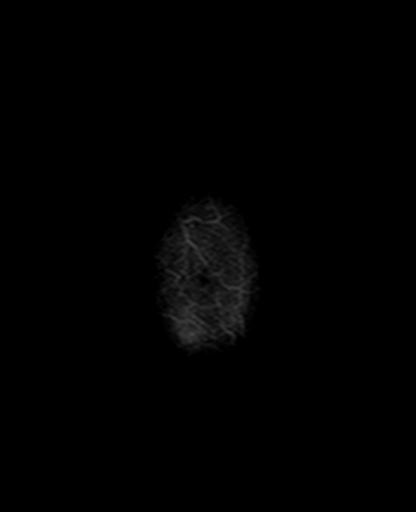

[Series 13: pha_images · axial · 3.0mm · 0.90mm/px · z∈[-148,+26]mm · 5 of 57 slices shown]
[im 1/57]
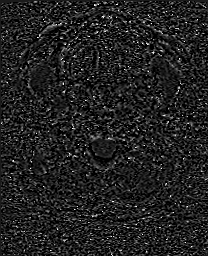
[im 15/57]
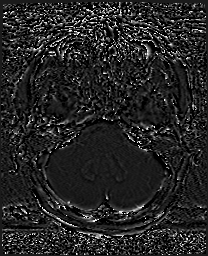
[im 29/57]
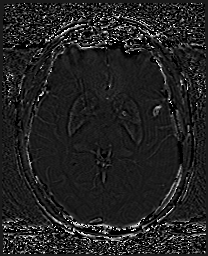
[im 43/57]
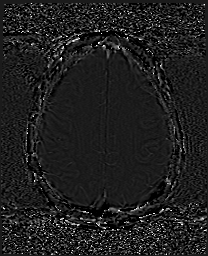
[im 57/57]
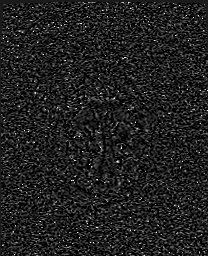

[Series 14: swi_images · axial · 3.0mm · 0.90mm/px · z∈[-148,+29]mm · 5 of 60 slices shown]
[im 1/60]
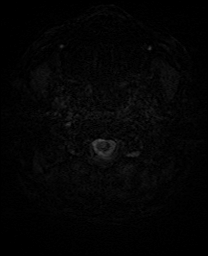
[im 15/60]
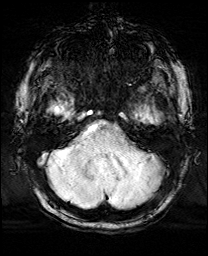
[im 30/60]
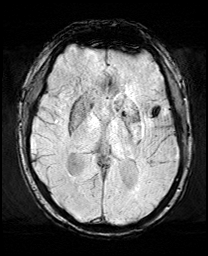
[im 45/60]
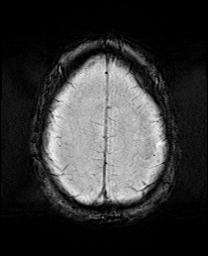
[im 60/60]
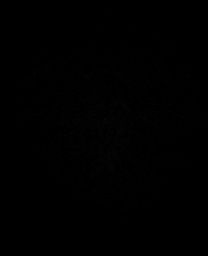

[Series 17: T2 · coronal · 5.0mm · 0.34mm/px · 3 of 29 slices shown (2 of 2)]
[im 1/29]
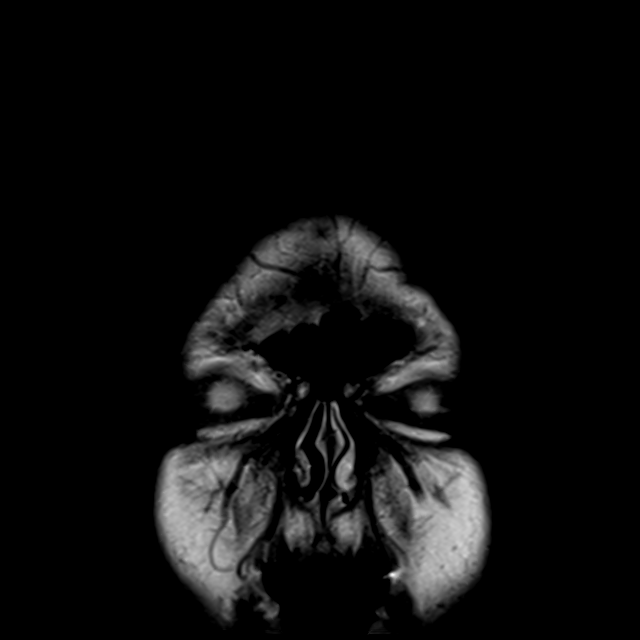
[im 15/29]
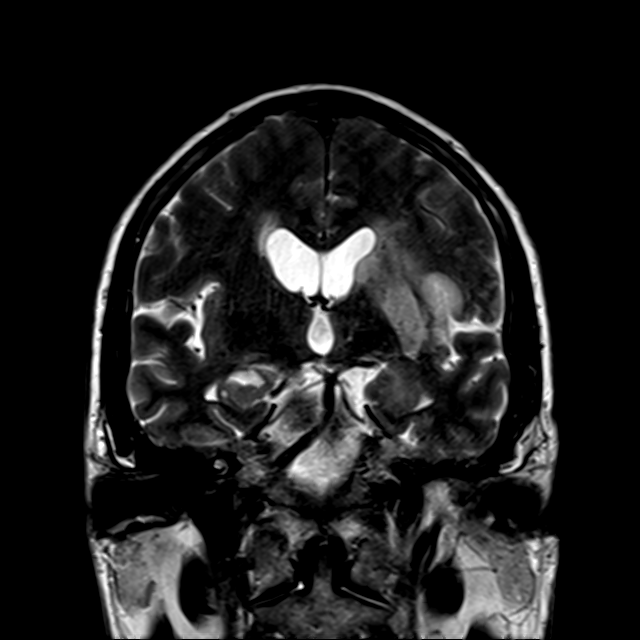
[im 29/29]
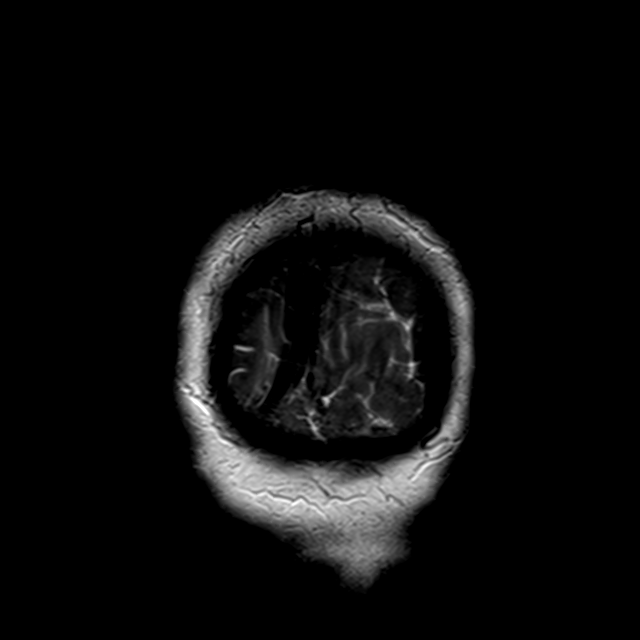

[43 of 48 positions shown; findings below may reference images not displayed]

FINDINGS: Brain: Extensive patchy and confluent restricted diffusion seen
throughout the left MCA distribution, consistent with evolving acute
left MCA distribution infarct. Overall size and involvement is
markedly increased as compared to previous MRI. Involvement is most
pronounced at the basal ganglia and insula where diffusion signal is
fairly confluent in nature. Associated gyral swelling and edema
without significant regional mass effect or midline shift. Evidence
for associated subarachnoid hemorrhage at the posterior aspect of
the left sylvian fissure again noted, stable. Few additional
scattered foci of petechial blood products noted elsewhere
throughout the areas of infarction.

Few additional cortical to subcortical infarcts noted within the
contralateral right frontal lobe (series 5, images 96, 92). Few
additional patchy small volume left ACA territory infarcts noted as
well (series 5, image 96, 94).

Underlying chronic microvascular ischemic disease again noted. No
mass lesion. No hydrocephalus or extra-axial fluid collection.

Vascular: Major intracranial vascular flow voids are maintained at
the skull base. Double Y stent within the proximal left MCA branches
noted.

Skull and upper cervical spine: Craniocervical junction within
normal limits. Bone marrow signal intensity normal. No scalp soft
tissue abnormality.

Sinuses/Orbits: Globes and orbital soft tissues demonstrate no acute
finding. Scattered mucosal thickening noted throughout the sphenoid
ethmoidal sinuses. Small left mastoid effusion.

Other: None.
IMPRESSION: 1. Extensive evolving acute ischemic left MCA distribution infarct,
increased in size as compared to previous MRI from [DATE].
Associated edema without significant midline shift.
2. Small volume acute subarachnoid hemorrhage at the posterior
aspect of the left Sylvian fissure, stable.
3. Few additional small volume acute ischemic cortical and
subcortical infarcts involving the contralateral right frontal lobe
and left ACA distribution as above.
4. Underlying chronic microvascular ischemic disease.

## 2022-02-14 MED ORDER — POLYETHYLENE GLYCOL 3350 17 G PO PACK
17.0000 g | PACK | Freq: Every day | ORAL | Status: DC
  Administered 2022-02-14 – 2022-02-17 (×4): 17 g
  Filled 2022-02-14 (×4): qty 1

## 2022-02-14 MED ORDER — DOCUSATE SODIUM 50 MG/5ML PO LIQD
100.0000 mg | Freq: Every day | ORAL | Status: DC
  Administered 2022-02-14 – 2022-02-17 (×4): 100 mg
  Filled 2022-02-14 (×4): qty 10

## 2022-02-14 NOTE — Progress Notes (Signed)
? ? ?Referring Physician(s): ?Dr Erlinda Hong ? ?Supervising Physician: Luanne Bras ? ?Patient Status:  [facility] - In-pt ? ?Chief Complaint: ? ?CVA ?L MCA thrombectomy 02/11/22 am ?Several  hours later---new rt sided weakness; aphasia ?New L MCA thrombectomy with rescue stenting 02/11/22 1000 pm ? ?Subjective: ? ?Up in bed ?Follows me with his eyes ?Says yes to everything--- not really answering anything ?Moves left arm and leg to command ?Can move Rt foot-- no movement on Rt arm/hand ? ?MRI early this am:  IMPRESSION: ?1. 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 ?Associated edema without significant midline shift. ?2. Small volume acute subarachnoid hemorrhage at the posterior ?aspect of the left Sylvian fissure, stable. ?3. Few additional small volume acute ischemic cortical and ?subcortical infarcts involving the contralateral right frontal lobe ?and left ACA distribution as above. ?4. Underlying chronic microvascular ischemic disease. ? ? ?Allergies: ?Patient has no known allergies. ? ?Medications: ?Prior to Admission medications   ?Medication Sig Start Date End Date Taking? Authorizing Provider  ?Calcium Carbonate Antacid (TUMS PO) Take 1-2 tablets by mouth daily as needed (acid reflux/indigestion).   Yes [provider]  ? ? ? ?Vital Signs: ?BP (!) 132/118 (BP Location: Left Arm)   Pulse 92   Temp 98.4 ?F (36.9 ?C) (Oral)   Resp 16   Ht 5\' 6"  (1.676 m)   Wt 238 lb 15.7 oz (108.4 kg)   SpO2 98%   BMI 38.57 kg/m?  ? ? ?Follows me in room with his eyes ?EOMI ?Tries to stick out tongue like me ?Tries to puff cheeks like me ?Moves left arm and leg well-- to command ?Moves rt foot to command ?No Rt arm movement ? ?Imaging: ?CT ANGIO HEAD NECK W WO CM ? ?Result Date: 02/11/2022 ?CLINICAL DATA:  Code stroke. 61 year old male with sudden onset right side weakness. EXAM: CT ANGIOGRAPHY HEAD AND NECK TECHNIQUE: Multidetector CT  imaging of the head and neck was performed using the standard protocol during bolus administration of intravenous contrast. Multiplanar CT image reconstructions and MIPs were obtained to evaluate the vascular anatomy. Carotid stenosis measurements (when applicable) are obtained utilizing NASCET criteria, using the distal internal carotid diameter as the denominator. RADIATION DOSE REDUCTION: This exam was performed according to the departmental dose-optimization program which includes automated exposure control, adjustment of the mA and/or kV according to patient size and/or use of iterative reconstruction technique. CONTRAST:  11mL OMNIPAQUE IOHEXOL 350 MG/ML SOLN COMPARISON:  Plain head CT 0659 hours. FINDINGS: CTA NECK Skeleton: No acute osseous abnormality identified. Ordinary cervical spine degeneration. Upper chest: Atelectasis and mediastinal lipomatosis. Other neck: Small volume retained secretions in the pharynx. Otherwise negative. Aortic arch: 3 vessel arch configuration. Minimal arch atherosclerosis. Right carotid system: Tortuous brachiocephalic artery without plaque or stenosis. Minor soft and calcified plaque at the right carotid bifurcation without stenosis. Left carotid system: Mild tortuosity and carotid bifurcation plaque with no stenosis. Vertebral arteries: Negative.  Mildly dominant left vertebral artery. CTA HEAD Posterior circulation: Minimal right V4 calcified plaque without stenosis. Dominant left V4 segment. Normal PICA origins and vertebrobasilar junction. Patent basilar artery without stenosis. Patent SCA and PCA origins. Posterior communicating arteries are diminutive or absent. PCA branches are within normal limits. Anterior circulation: Both ICA siphons are patent. Mild to moderate calcified plaque primarily in the supraclinoid segments. Mild supraclinoid stenosis results greater on the left. Patent carotid termini, MCA and ACA origins. Anterior communicating artery and ACA branches  are within normal limits. Right MCA M1 segment and bifurcation are patent without stenosis. Right MCA branches are within normal limits. Left MCA origin is patent but the left M1 is occluded about 12 mm distal to the origin (series 7, image 97). The left anterior temporal artery origin is patent proximal to the occlusion and there is good reconstitution of left MCA branches Venous sinuses: Patent. Anatomic variants: Dominant left vertebral artery. Review of the MIP images confirms the above findings IMPRESSION: 1. 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 This was discussed by telephone with Dr. Delora Fuel on [id number] at 0711 hours. Anterior temporal artery remains patent and there is robust MCA branch collateral enhancement. 2. Generally mild for age atherosclerosis, most pronounced in the supraclinoid ICA siphons with calcified plaque. No other significant arterial stenosis. Electronically Signed   By: Genevie Ann M.D.   On: 02/11/2022 07:20  ? ?CT HEAD WO CONTRAST (5MM) ? ?Result Date: 02/12/2022 ?CLINICAL DATA:  Provided history: Neuro deficit, acute, stroke suspected. 24 hours post TNKase. EXAM: CT HEAD WITHOUT CONTRAST TECHNIQUE: Contiguous axial images were obtained from the base of the skull through the vertex without intravenous contrast. RADIATION DOSE REDUCTION: This exam was performed according to the departmental dose-optimization program which includes automated exposure control, adjustment of the mA and/or kV according to patient size and/or use of iterative reconstruction technique. COMPARISON:  Prior head CT examinations performed 02/11/2022. CT angiogram head/neck 02/11/2022. Brain MRI 02/11/2022. FINDINGS: Brain: Cerebral volume is normal. Redemonstrated known acute left MCA territory infarcts within the left frontal, parietal and temporal lobes as well as left insula and left basal ganglia. Since the most recent prior head CT of 02/11/2022, there has been slight further demarcation  of these infarcts (for instance at the left insula). As before, there is superimposed contrast staining within the left basal ganglia. No significant mass effect at this time. Persistent small-volume hyperdensity along the posterior aspect of the left sylvian fissure, likely reflecting subarachnoid hemorrhage. No evidence of an intracranial mass. No midline shift. Vascular: Redemonstrated stents within proximal left MCA vessels. Atherosclerotic calcifications. Skull: Normal. Negative for fracture or focal lesion. Sinuses/Orbits: Visualized orbits show no acute finding. Mild mucosal thickening and fluid scattered within the bilateral ethmoid sinuses. Mild mucosal thickening within the right sphenoid sinus. Moderate/severe mucosal thickening within the left sphenoid sinus with associated chronic reactive osteitis. Trace mucosal thickening within the right maxillary sinus at the imaged levels. Trace mucosal thickening and small fluid level within the left maxillary sinus at the imaged levels. IMPRESSION: 1. Redemonstrated acute left MCA territory infarcts. Since the most recent prior head CT of 02/11/2022, there has been slight further demarcation of these infarcts (for instance at the left insula). 2. Persistent small-volume hyperdensity along the posterior aspect of the left Sylvian fissure, likely reflecting acute subarachnoid hemorrhage. 3. Paranasal sinus disease, as described. Electronically Signed   By: Kellie Simmering D.O.   On: 02/12/2022 09:32  ? ?CT HEAD WO CONTRAST ? ?Result Date: 02/11/2022 ?CLINICAL DATA:  Follow-up examination for stroke, status post TNK and mechanical thrombectomy. EXAM: CT HEAD WITHOUT CONTRAST TECHNIQUE: Contiguous axial images were obtained from the base of the skull through the vertex without intravenous contrast. RADIATION DOSE REDUCTION: This exam was performed according to the departmental dose-optimization program which includes automated exposure control, adjustment of the mA  and/or kV according to patient size and/or use of iterative reconstruction technique. COMPARISON:  Prior CT from earlier the same day. FINDINGS: Brain: Patchy hypodensity  involving the left basal ganglia of an adj

## 2022-02-14 NOTE — Plan of Care (Signed)
?  Problem: Education: ?Goal: Knowledge of disease or condition will improve ?Outcome: Progressing ?Goal: Knowledge of secondary prevention will improve (SELECT ALL) ?Outcome: Progressing ?Goal: Knowledge of patient specific risk factors will improve (INDIVIDUALIZE FOR PATIENT) ?Outcome: Progressing ?Goal: Individualized Educational Video(s) ?Outcome: Progressing ?  ?Problem: Coping: ?Goal: Will verbalize positive feelings about self ?Outcome: Progressing ?Goal: Will identify appropriate support needs ?Outcome: Progressing ?  ?Problem: Health Behavior/Discharge Planning: ?Goal: Ability to manage health-related needs will improve ?Outcome: Progressing ?  ?Problem: Self-Care: ?Goal: Ability to participate in self-care as condition permits will improve ?Outcome: Progressing ?Goal: Verbalization of feelings and concerns over difficulty with self-care will improve ?Outcome: Progressing ?Goal: Ability to communicate needs accurately will improve ?Outcome: Progressing ?  ?Problem: Nutrition: ?Goal: Risk of aspiration will decrease ?Outcome: Progressing ?  ?Problem: Ischemic Stroke/TIA Tissue Perfusion: ?Goal: Complications of ischemic stroke/TIA will be minimized ?Outcome: Progressing ?  ?Problem: Spontaneous Subarachnoid Hemorrhage Tissue Perfusion: ?Goal: Complications of Spontaneous Subarachnoid Hemorrhage will be minimized ?Outcome: Progressing ?  ?

## 2022-02-14 NOTE — Progress Notes (Addendum)
STROKE TEAM PROGRESS NOTE  ? ?INTERVAL HISTORY ?Patient is seen in his room with his sister in law at the bedside.  Plan is for a swallow evaluation soon and transfer to [facility] [place].  He has been hemodynamically stable and his neurological exam is unchanged. ? ?Vitals:  ? 02/14/22 0900 02/14/22 1000 02/14/22 1100 02/14/22 1200  ?BP: 137/90 (!) 131/91 139/88 (!) 143/90  ?Pulse: 76 75 89 86  ?Resp: 19 17 17 19   ?Temp:    98.9 ?F (37.2 ?C)  ?TempSrc:    Axillary  ?SpO2: 92% 93% 94% 93%  ?Weight:      ?Height:      ? ?CBC:  ?Recent Labs  ?Lab 02/11/22 ?0654 02/11/22 ?2108 02/12/22 ?0421 02/13/22 ?[id number] 02/14/22 ?0248  ?WBC 7.8  --  15.3* 12.6* 10.3  ?NEUTROABS 2.8  --  10.4*  --   --   ?HGB 16.4   < > 15.3 13.8 15.3  ?HCT 48.8   < > 45.1 39.3 44.5  ?MCV 91.6  --  93.6 90.8 90.8  ?PLT 181  --  170 155 175  ? < > = values in this interval not displayed.  ? ? ?Basic Metabolic Panel:  ?Recent Labs  ?Lab 02/13/22 ?0233 02/14/22 ?0248  ?NA 133* 134*  ?K 3.8 3.5  ?CL 106 104  ?CO2 21* 23  ?GLUCOSE 131* 153*  ?BUN 15 14  ?CREATININE 1.01 0.94  ?CALCIUM 8.2* 8.5*  ? ? ?Lipid Panel:  ?Recent Labs  ?Lab 02/12/22 ?0421  ?CHOL 198  ?TRIG 146  ?HDL 47  ?CHOLHDL 4.2  ?VLDL 29  ?[place] 122*  ? ? ?HgbA1c:  ?Recent Labs  ?Lab 02/11/22 ?0654  ?HGBA1C 6.0*  ? ? ?Urine Drug Screen:  ?Recent Labs  ?Lab 02/11/22 ?1608  ?LABOPIA NONE DETECTED  ?COCAINSCRNUR NONE DETECTED  ?LABBENZ NONE DETECTED  ?AMPHETMU NONE DETECTED  ?THCU NONE DETECTED  ?LABBARB NONE DETECTED  ? ?  ?Alcohol Level  ?Recent Labs  ?Lab 02/11/22 ?0654  ?ETH <10  ? ? ? ?IMAGING past 24 hours ?MR BRAIN WO CONTRAST ? ?Result Date: 02/14/2022 ?CLINICAL DATA:  Follow-up examination for acute stroke. EXAM: MRI HEAD WITHOUT CONTRAST TECHNIQUE: Multiplanar, multiecho pulse sequences of the brain and surrounding structures were obtained without intravenous contrast. COMPARISON:  Comparison made with prior CT from 02/12/2022 as well as previous exams. FINDINGS: Brain: Extensive patchy and  confluent restricted diffusion seen throughout the left MCA distribution, consistent with evolving acute left MCA distribution infarct. Overall size and involvement is markedly increased as compared to previous MRI. Involvement is most pronounced at the basal ganglia and insula where diffusion signal is fairly confluent in nature. Associated gyral swelling and edema without significant regional mass effect or midline shift. Evidence for associated subarachnoid hemorrhage at the posterior aspect of the left sylvian fissure again noted, stable. Few additional scattered foci of petechial blood products noted elsewhere throughout the areas of infarction. Few additional cortical to subcortical infarcts noted within the contralateral right frontal lobe (series 5, images 96, 92). Few additional patchy small volume left ACA territory infarcts noted as well (series 5, image 96, 94). Underlying chronic microvascular ischemic disease again noted. No mass lesion. No hydrocephalus or extra-axial fluid collection. Vascular: Major intracranial vascular flow voids are maintained at the skull base. Double Y stent within the proximal left MCA branches noted. Skull and upper cervical spine: Craniocervical junction within normal limits. Bone marrow signal intensity normal. No scalp soft tissue abnormality. Sinuses/Orbits: Globes and orbital soft  tissues demonstrate no acute finding. Scattered mucosal thickening noted throughout the sphenoid ethmoidal sinuses. Small left mastoid effusion. Other: None. IMPRESSION: 1. Extensive evolving acute ischemic left MCA distribution infarct, increased in size as compared to previous MRI from 02/11/2022. Associated edema without significant midline shift. 2. Small volume acute subarachnoid hemorrhage at the posterior aspect of the left Sylvian fissure, stable. 3. Few additional small volume acute ischemic cortical and subcortical infarcts involving the contralateral right frontal lobe and left ACA  distribution as above. 4. Underlying chronic microvascular ischemic disease. Electronically Signed   By: Jeannine Boga M.D.   On: 02/14/2022 02:18   ? ?PHYSICAL EXAM ?General:  Well-developed, well-nourished middle-aged Caucasian male patient in no acute distress ?Respiratory:  Respirations regular and unlabored ? ?NEURO:  ?Mental Status: difficult to assess due to aphasia, unable to follow commands ?Speech/Language: Expressive and receptive aphasia present.  Patient is able to say "yes" but does not appear to understand what is being said ? ?Cranial Nerves:  ?II: PERRL.  ?III, IV, VI: EOMI. Eyelids elevate symmetrically.  ?VII: Right sided facial droop present ?VIII: hearing intact to voice. ?IX, X: Phonation is normal.  ?Motor: Able to move LUE and LLE spontaneously. RUE and RLE move to noxious stimuli ?Gait- deferred ? ? ?ASSESSMENT/PLAN ?Gordon Fisher is a 61 y.o. male with no significant medical history presenting with slurred speech and right sided weakness.  He was brought to [facility] and was given TNK then transferred here for thrombectomy.  Mechnaical thrombectomy was performed in the left M1 MCA with TICI 3 flow achieved.  Unfortunately, later that day the left M1 MCA reoccluded.  Mechanical thrombectomy was performed again with recanalization achieved but dissection of the MCA occurred.  A stent was placed, but this unfortunately occluded and was unable to be recanalized.  Patient has remained intubated after the procedure and was extubated on 4/14.  He remains aphasic with right hemiplegia. ? ?Stroke:  left MCA infarct due to left M1 occlusion s/p TNK and thrombectomy x2 with stent placement for left M2 dissection and then reocclusion of stent, etiology unclear ?code Stroke CT head Hyperdense left MCA, no ICH ASPECTS 9   ?CTA head & neck left MCA M1 occlusion ?MRI  small areas of acute infarct in left MCA territory involving left caudate putamen, insula and left frontal and  parietal lobe. Mild HT in left parietal lobe ?S/p IR x 2 with stent placement for left M2 dissection and then stent reocclusion ?Repeat CT head 4/14 evolving left MCA distribution infarct, small SAH, stenting of proximal left MCA branches ?Repeat MRI extensive evolving acute ischemic left MCA territory infarct, small volume SAH at posterior aspect of Sylvian fissure, few small volume acute ischemic cortical and subcortical infarcts in right frontal lobe and ACA distribution ?2D Echo EF 60-65%, interatrial septum not well visualized ?Consider TEE for further embolic work up ?hypercoagulable work up pending ?LDL 122 ?HgbA1c 6.0 ?VTE prophylaxis - SCDs ?No antithrombotic prior to admission, now on aspirin 81 mg daily and Brilinta (ticagrelor) 90 mg bid.  ?Therapy recommendations:  CIR ?Disposition:  pending ? ?Hypertension ?Home meds:  none ?Stable ?SBP <160 now ?Off cleviprex ?Long-term BP goal normotensive ? ?Hyperlipidemia ?Home meds:  none ?LDL 122, goal < 70 ?Add atorvastatin 40 mg  ?Continue statin at discharge ? ?Respiratory failure ?Patient left intubated after procedure ?Ventilator management per CCM ?Extubated 4/14 ?Tolerating well ? ?Other Stroke Risk Factors ?Obesity, Body mass index is 38.57 kg/m?., BMI >/= 30  associated with increased stroke risk, recommend weight loss, diet and exercise as appropriate  ? ?Other Active Problems ?none ? ?Hospital day # 3 ? ?[place] , MSN, AGACNP-BC ?[facility] ?See Amion for schedule and pager information ?02/14/2022 12:48 PM ? ?ATTENDING NOTE: ?I reviewed above note and agree with the assessment and plan. Pt was seen and examined.  ? ?Wife at bedside.  Patient overnight neuro stable no acute change.  Still has global aphasia and right arm plegia and leg paresis.  MRI repeat showed extensive left MCA infarct with punctate right ACA and ACA/MCA infarcts.  We will continue further embolic work-up including TEE and hypercoagulable work-up.  Off  Cleviprex, on aspirin and Brilinta as well as statin.  PT/OT recommend CIR. ? ?For detailed assessment and plan, please refer to above as I have made changes wherever appropriate.  ? ?Rosalin Hawking, MD PhD ?Stroke Neur

## 2022-02-14 NOTE — Evaluation (Signed)
Clinical/Bedside Swallow Evaluation ?Patient Details  ?Name: Gordon Fisher ?MRN: [id number] ?Date of Birth: 1961-02-26 ? ?Today's Date: 02/14/2022 ?Time: SLP Start Time (ACUTE ONLY): [id number] SLP Stop Time (ACUTE ONLY): 1120 ?SLP Time Calculation (min) (ACUTE ONLY): 28 min ? ?Past Medical History: History reviewed. No pertinent past medical history. ?Past Surgical History:  ?Past Surgical History:  ?Procedure Laterality Date  ? IR CT HEAD LTD  02/11/2022  ? IR CT HEAD LTD  02/11/2022  ? IR CT HEAD LTD  02/11/2022  ? IR PERCUTANEOUS ART THROMBECTOMY/INFUSION INTRACRANIAL INC DIAG ANGIO  02/11/2022  ? IR PERCUTANEOUS ART THROMBECTOMY/INFUSION INTRACRANIAL INC DIAG ANGIO  02/11/2022  ? IR US GUIDE VASC ACCESS RIGHT  02/11/2022  ? IR US GUIDE VASC ACCESS RIGHT  02/11/2022  ? RADIOLOGY WITH ANESTHESIA N/A 02/11/2022  ? Procedure: IR WITH ANESTHESIA;  Surgeon: Radiologist, Medication, MD;  Location: [place];  Service: Radiology;  Laterality: N/A;  ? RADIOLOGY WITH ANESTHESIA N/A 02/11/2022  ? Procedure: RADIOLOGY WITH ANESTHESIA;  Surgeon: Radiologist, Medication, MD;  Location: [place];  Service: Radiology;  Laterality: N/A;  ? ?HPI:  ?61 y.o. male with no significant PMH who presented to [facility] on 02/11/22 with R sided weakness, R facial droop, slurred speech. He got up at [place] on 02/11/22 and walked to the bathroom by himself, used the bathroom and went back to sleep. He woke up about 30 mins later with R sided weakness and slurred speech.     He was brought in to [facility] by EMS for further evaluation. Was evaluated by Telestroke and CTH w/o contrast with hyperdense L MCA with ASPECTS of 10. He was given tNKASE at 0721. CTA with L MCA M1 . Patient was transferred to [facility] for NIR thrombectomy.     Initial NIR procedure demonstrated occlusion of the distal left M1 MCA; mechanical thrombectomy was completed with TICI 3 recanalization. Post-procedure CT and MRI Brain were completed. Several hours postprocedure, patient  experienced recurrence of neurologic deficits prompting return to NIR; procedure demonstrated distal L M1 occlusion prompting repeat thrombectomy and stent placement x 2. Stent unfortunately became occluded and was unable to be crossed. Patient returned to ICU with cangrelor gtt; MRI 02/14/22 Extensive evolving acute ischemic left MCA distribution infarct, increased in size as compared to previous MRI from 02/11/2022. Associated edema without significant midline shift. 2. Small volume acute subarachnoid hemorrhage at the posterior aspect of the left Sylvian fissure, stable. 3. Few additional small volume acute ischemic cortical and subcortical infarcts involving the contralateral right frontal lobe and left ACA distribution as above. CXR on 02/13/22 showed Improvement in pulmonary vascular congestion; SLE/BSE generated  ?  ?Assessment / Plan / Recommendation  ?Clinical Impression ? Pt presents with oropharyngeal dysphagia characterized by mild R anterior loss with various consistencies, slight oral holding, delay in the initiation of the swallow and delayed cough noted during consumption of thin, puree and solids.  Pt's inattention and impulsivity also contribute to increased risk for aspiration.  Mastication appeared adequate, but pt independently took larger bites with cues to reduce bite size min helpful during intake. Due to difficulty following commands, right buccal pocketing unable to be assessed, but may be occurring d/t R overall facial weakness.   Pt currently has Cortrak in place for nutrition.  Ice chips did not present any dysphagic symptoms during consumption.  Due to pt's impaired cognitive status/inability to follow directives consistently, an objective assessment may be warranted to r/o silent aspiration and/or PO readiness  established prior to initiating a diet for safety purposes.  Recommend NPO with ice chips after oral care completed until objective assessment is completed.  ST will f/u for  objective assessment, dysphagia tx/diet tolerance during acute stay. ?SLP Visit Diagnosis: Dysphagia, oropharyngeal phase (R13.12) ?   ?Aspiration Risk ? Mild aspiration risk;Moderate aspiration risk  ?  ?Diet Recommendation   NPO ? ?Medication Administration: Crushed with puree  ?  ?Other  Recommendations Oral Care Recommendations: Oral care BID   ? ?Recommendations for follow up therapy are one component of a multi-disciplinary discharge planning process, led by the attending physician.  Recommendations may be updated based on patient status, additional functional criteria and insurance authorization. ? ?Follow up Recommendations Acute inpatient rehab (3hours/day)  ? ? ?  ?Assistance Recommended at Discharge Frequent or constant Supervision/Assistance  ?Functional Status Assessment Patient has had a recent decline in their functional status and demonstrates the ability to make significant improvements in function in a reasonable and predictable amount of time.  ?Frequency and Duration min 2x/week  ?1 week ?  ?   ? ?Prognosis Prognosis for Safe Diet Advancement: Good  ? ?  ? ?Swallow Study   ?General Date of Onset: 02/11/22 ?HPI: 61 y.o. male with no significant PMH who presented to [facility] on 02/11/22 with R sided weakness, R facial droop, slurred speech. He got up at [place] on 02/11/22 and walked to the bathroom by himself, used the bathroom and went back to sleep. He woke up about 30 mins later with R sided weakness and slurred speech.     He was brought in to [facility] by EMS for further evaluation. Was evaluated by Telestroke and CTH w/o contrast with hyperdense L MCA with ASPECTS of 10. He was given tNKASE at 0721. CTA with L MCA M1 . Patient was transferred to [facility] for NIR thrombectomy.     Initial NIR procedure demonstrated occlusion of the distal left M1 MCA; mechanical thrombectomy was completed with TICI 3 recanalization. Post-procedure CT and MRI Brain were completed. Several hours  postprocedure, patient experienced recurrence of neurologic deficits prompting return to NIR; procedure demonstrated distal L M1 occlusion prompting repeat thrombectomy and stent placement x 2. Stent unfortunately became occluded and was unable to be crossed. Patient returned to ICU with cangrelor gtt; SLE/BSE generated ?Type of Study: Bedside Swallow Evaluation ?Previous Swallow Assessment: [facility] swallow screen unable to be completed per nursing report ?Diet Prior to this Study: NPO (Has Cortrak) ?Temperature Spikes Noted: No ?Respiratory Status: Room air ?History of Recent Intubation: Yes ?Length of Intubations (days): 2 days ?Date extubated: 02/12/22 ?Behavior/Cognition: Alert;Requires cueing;Doesn't follow directions ?Oral Cavity Assessment: Dry ?Oral Care Completed by SLP: Recent completion by staff ?Oral Cavity - Dentition: Adequate natural dentition ?Self-Feeding Abilities: Able to feed self;Needs assist;Needs set up ?Patient Positioning: Upright in bed ?Baseline Vocal Quality: Other (comment) (DTA d/t min verbalizations) ?Volitional Cough: Strong ?Volitional Swallow: Unable to elicit  ?  ?Oral/Motor/Sensory Function Overall Oral Motor/Sensory Function: Other (comment) (DTA d/t pt unable to follow oral directives)   ?Ice Chips Ice chips: Within functional limits ?Presentation: Spoon   ?Thin Liquid Thin Liquid: Impaired ?Presentation: Cup;Straw;Spoon ?Pharyngeal  Phase Impairments: Suspected delayed Swallow;Cough - Delayed  ?  ?Nectar Thick Nectar Thick Liquid: Not tested   ?Honey Thick Honey Thick Liquid: Not tested   ?Puree Puree: Impaired ?Presentation: Self Fed ?Oral Phase Functional Implications: Right anterior spillage;Other (comment) (mild) ?Pharyngeal Phase Impairments: Suspected delayed Swallow   ?Solid ? ? ?  Solid: Impaired ?Presentation: Self Fed ?Oral Phase Functional Implications: Other (comment) (decreased sustained attention;impulsivity) ?Pharyngeal Phase Impairments: Cough - Delayed  ? ?   ? ?Elvina Sidle, M.S., CCC-SLP ?02/14/2022,4:02 PM ? ? ? ?

## 2022-02-14 NOTE — Evaluation (Signed)
Speech Language Pathology Evaluation ?Patient Details ?Name: Gordon Fisher ?MRN: [id number] ?DOB: 1961/09/14 ?Today's Date: 02/14/2022 ?Time: [id number] ?SLP Time Calculation (min) (ACUTE ONLY): 28 min ? ?Problem List:  ?Patient Active Problem List  ? Diagnosis Date Noted  ? Cerebrovascular accident (CVA) due to occlusion of left middle cerebral artery ([place]) 02/11/2022  ? ?Past Medical History: History reviewed. No pertinent past medical history. ?Past Surgical History:  ?Past Surgical History:  ?Procedure Laterality Date  ? IR CT HEAD LTD  02/11/2022  ? IR CT HEAD LTD  02/11/2022  ? IR CT HEAD LTD  02/11/2022  ? IR PERCUTANEOUS ART THROMBECTOMY/INFUSION INTRACRANIAL INC DIAG ANGIO  02/11/2022  ? IR PERCUTANEOUS ART THROMBECTOMY/INFUSION INTRACRANIAL INC DIAG ANGIO  02/11/2022  ? IR US GUIDE VASC ACCESS RIGHT  02/11/2022  ? IR US GUIDE VASC ACCESS RIGHT  02/11/2022  ? RADIOLOGY WITH ANESTHESIA N/A 02/11/2022  ? Procedure: IR WITH ANESTHESIA;  Surgeon: Radiologist, Medication, MD;  Location: [place];  Service: Radiology;  Laterality: N/A;  ? RADIOLOGY WITH ANESTHESIA N/A 02/11/2022  ? Procedure: RADIOLOGY WITH ANESTHESIA;  Surgeon: Radiologist, Medication, MD;  Location: [place];  Service: Radiology;  Laterality: N/A;  ? ?HPI:  ?61 y.o. male with no significant PMH who presented to [facility] on 02/11/22 with R sided weakness, R facial droop, slurred speech. He got up at [place] on 02/11/22 and walked to the bathroom by himself, used the bathroom and went back to sleep. He woke up about 30 mins later with R sided weakness and slurred speech.     He was brought in to [facility] by EMS for further evaluation. Was evaluated by Telestroke and CTH w/o contrast with hyperdense L MCA with ASPECTS of 10. He was given tNKASE at 0721. CTA with L MCA M1 . Patient was transferred to [facility] for NIR thrombectomy.     Initial NIR procedure demonstrated occlusion of the distal left M1 MCA; mechanical thrombectomy was completed with TICI  3 recanalization. Post-procedure CT and MRI Brain were completed. Several hours postprocedure, patient experienced recurrence of neurologic deficits prompting return to NIR; procedure demonstrated distal L M1 occlusion prompting repeat thrombectomy and stent placement x 2. Stent unfortunately became occluded and was unable to be crossed. Patient returned to ICU with cangrelor gtt; SLE/BSE generated  ? ?Assessment / Plan / Recommendation ?Clinical Impression ? Pt presents with global aphasia characterized by decreased ability to follow commands and/or answer questions.  Pt perseverates on "yea" and uses min gestures/facial expressions when asked questions.  Yes/No questions are inconsistent and 1-step commands are decreased to 50% with max cues at current time.  OME, written expression, naming, reading tasks were all unable to be completed during this assessment.  Pt unable to repeat words and/or participate with automatic tasks during this assessment.  ST will f/u for aphasia tx and ongoing cognitive assessment during acute stay.  Recommend CIR if pt able to participate.  Thank you for this consult. ?   ?SLP Assessment ? SLP Recommendation/Assessment: Patient needs continued Speech Language Pathology Services ?SLP Visit Diagnosis: Aphasia (R47.01);Cognitive communication deficit (R41.841);Attention and concentration deficit ?Attention and concentration deficit following: Cerebral infarction  ?  ?Recommendations for follow up therapy are one component of a multi-disciplinary discharge planning process, led by the attending physician.  Recommendations may be updated based on patient status, additional functional criteria and insurance authorization. ?   ?Follow Up Recommendations ? Acute inpatient rehab (3hours/day)  ?  ?Assistance Recommended at Discharge ? Frequent  or constant Supervision/Assistance  ?Functional Status Assessment Patient has had a recent decline in their functional status and demonstrates the  ability to make significant improvements in function in a reasonable and predictable amount of time.  ?Frequency and Duration min 2x/week  ?1 week ?  ?   ?SLP Evaluation ?Cognition ? Overall Cognitive Status: Impaired/Different from baseline ?Arousal/Alertness: Awake/alert ?Orientation Level:  (UTA) ?Attention: Focused ?Focused Attention: Impaired ?Focused Attention Impairment: Verbal basic;Functional basic ?Awareness: Impaired ?Behaviors: Perseveration;Impulsive  ?  ?   ?Comprehension ? Auditory Comprehension ?Overall Auditory Comprehension: Impaired ?Yes/No Questions: Impaired ?Basic Biographical Questions: 0-25% accurate ?Commands: Impaired ?One Step Basic Commands: 25-49% accurate ?Visual Recognition/Discrimination ?Discrimination: Not tested ?Reading Comprehension ?Reading Status: Not tested  ?  ?Expression Expression ?Primary Mode of Expression: Other (comment) (facial expressions) ?Verbal Expression ?Overall Verbal Expression: Impaired ?Repetition: Impaired ?Level of Impairment: Word level ?Naming: Impairment ?Responsive: 0-25% accurate ?Confrontation: Impaired ?Convergent: 0-24% accurate ?Divergent: 0-24% accurate ?Non-Verbal Means of Communication: Other (comment) (facial expressions) ?Written Expression ?Written Expression: Not tested   ?Oral / Motor ? Oral Motor/Sensory Function ?Overall Oral Motor/Sensory Function: Mild impairment ?Facial ROM: Reduced right ?Facial Symmetry: Abnormal symmetry right ?Lingual Symmetry: Within Functional Limits ?Motor Speech ?Phonation: Normal (with words produced) ?Resonance: Within functional limits ?Intelligibility: Unable to assess (comment) ?Motor Planning: Not tested ?Motor Speech Errors: Not applicable   ?        ? ?Elvina Sidle, M.S., CCC-SLP ?02/14/2022, 4:26 PM ? ?

## 2022-02-15 ENCOUNTER — Inpatient Hospital Stay (HOSPITAL_COMMUNITY): Payer: 59 | Admitting: Anesthesiology

## 2022-02-15 ENCOUNTER — Encounter (HOSPITAL_COMMUNITY)
Admission: EM | Disposition: A | Discharge status: Rehabilitation Facility | Payer: Self-pay | Source: Home / Self Care | Attending: Neurology

## 2022-02-15 ENCOUNTER — Encounter (HOSPITAL_COMMUNITY): Payer: Self-pay | Admitting: Neurology

## 2022-02-15 ENCOUNTER — Inpatient Hospital Stay (HOSPITAL_COMMUNITY): Payer: 59

## 2022-02-15 DIAGNOSIS — I639 Cerebral infarction, unspecified: Secondary | ICD-10-CM | POA: Diagnosis not present

## 2022-02-15 DIAGNOSIS — I63512 Cerebral infarction due to unspecified occlusion or stenosis of left middle cerebral artery: Secondary | ICD-10-CM | POA: Diagnosis not present

## 2022-02-15 HISTORY — PX: BUBBLE STUDY: SHX6837

## 2022-02-15 HISTORY — PX: TEE WITHOUT CARDIOVERSION: SHX5443

## 2022-02-15 LAB — GLUCOSE, CAPILLARY
Glucose-Capillary: 126 mg/dL — ABNORMAL HIGH (ref 70–99)
Glucose-Capillary: 128 mg/dL — ABNORMAL HIGH (ref 70–99)
Glucose-Capillary: 132 mg/dL — ABNORMAL HIGH (ref 70–99)
Glucose-Capillary: 137 mg/dL — ABNORMAL HIGH (ref 70–99)
Glucose-Capillary: 155 mg/dL — ABNORMAL HIGH (ref 70–99)
Glucose-Capillary: 199 mg/dL — ABNORMAL HIGH (ref 70–99)

## 2022-02-15 LAB — ANA: Anti Nuclear Antibody (ANA): NEGATIVE

## 2022-02-15 LAB — BASIC METABOLIC PANEL
Anion gap: 9 (ref 5–15)
BUN: 17 mg/dL (ref 6–20)
CO2: 20 mmol/L — ABNORMAL LOW (ref 22–32)
Calcium: 8.8 mg/dL — ABNORMAL LOW (ref 8.9–10.3)
Chloride: 105 mmol/L (ref 98–111)
Creatinine, Ser: 0.93 mg/dL (ref 0.61–1.24)
GFR, Estimated: >60 mL/min (ref >60)
Glucose, Bld: 111 mg/dL — ABNORMAL HIGH (ref 70–99)
Potassium: 4 mmol/L (ref 3.5–5.1)
Sodium: 134 mmol/L — ABNORMAL LOW (ref 135–145)

## 2022-02-15 LAB — BETA-2-GLYCOPROTEIN I ABS, IGG/M/A
Beta-2 Glyco I IgG: <9 GPI IgG units (ref 0–20)
Beta-2-Glycoprotein I IgA: <9 GPI IgA units (ref 0–25)
Beta-2-Glycoprotein I IgM: <9 GPI IgM units (ref 0–32)

## 2022-02-15 LAB — CBC
HCT: 45.5 % (ref 39.0–52.0)
Hemoglobin: 16.3 g/dL (ref 13.0–17.0)
MCH: 32.2 pg (ref 26.0–34.0)
MCHC: 35.8 g/dL (ref 30.0–36.0)
MCV: 89.9 fL (ref 80.0–100.0)
Platelets: 182 10*3/uL (ref 150–400)
RBC: 5.06 MIL/uL (ref 4.22–5.81)
RDW: 12.2 % (ref 11.5–15.5)
WBC: 11.4 10*3/uL — ABNORMAL HIGH (ref 4.0–10.5)
nRBC: 0 % (ref 0.0–0.2)

## 2022-02-15 LAB — CARDIOLIPIN ANTIBODIES, IGG, IGM, IGA
Anticardiolipin IgA: <9 APL U/mL (ref 0–11)
Anticardiolipin IgG: <9 GPL U/mL (ref 0–14)
Anticardiolipin IgM: 11 MPL U/mL (ref 0–12)

## 2022-02-15 LAB — HOMOCYSTEINE: Homocysteine: 17.3 umol/L — ABNORMAL HIGH (ref 0.0–14.5)

## 2022-02-15 IMAGING — DX DG ABDOMEN 1V
1 series · 1 of 1 positions shown · non-contrast
Comparison: None.

CLINICAL DATA: Feeding tube placement.

EXAM:
ABDOMEN - 1 VIEW

[abdomen]
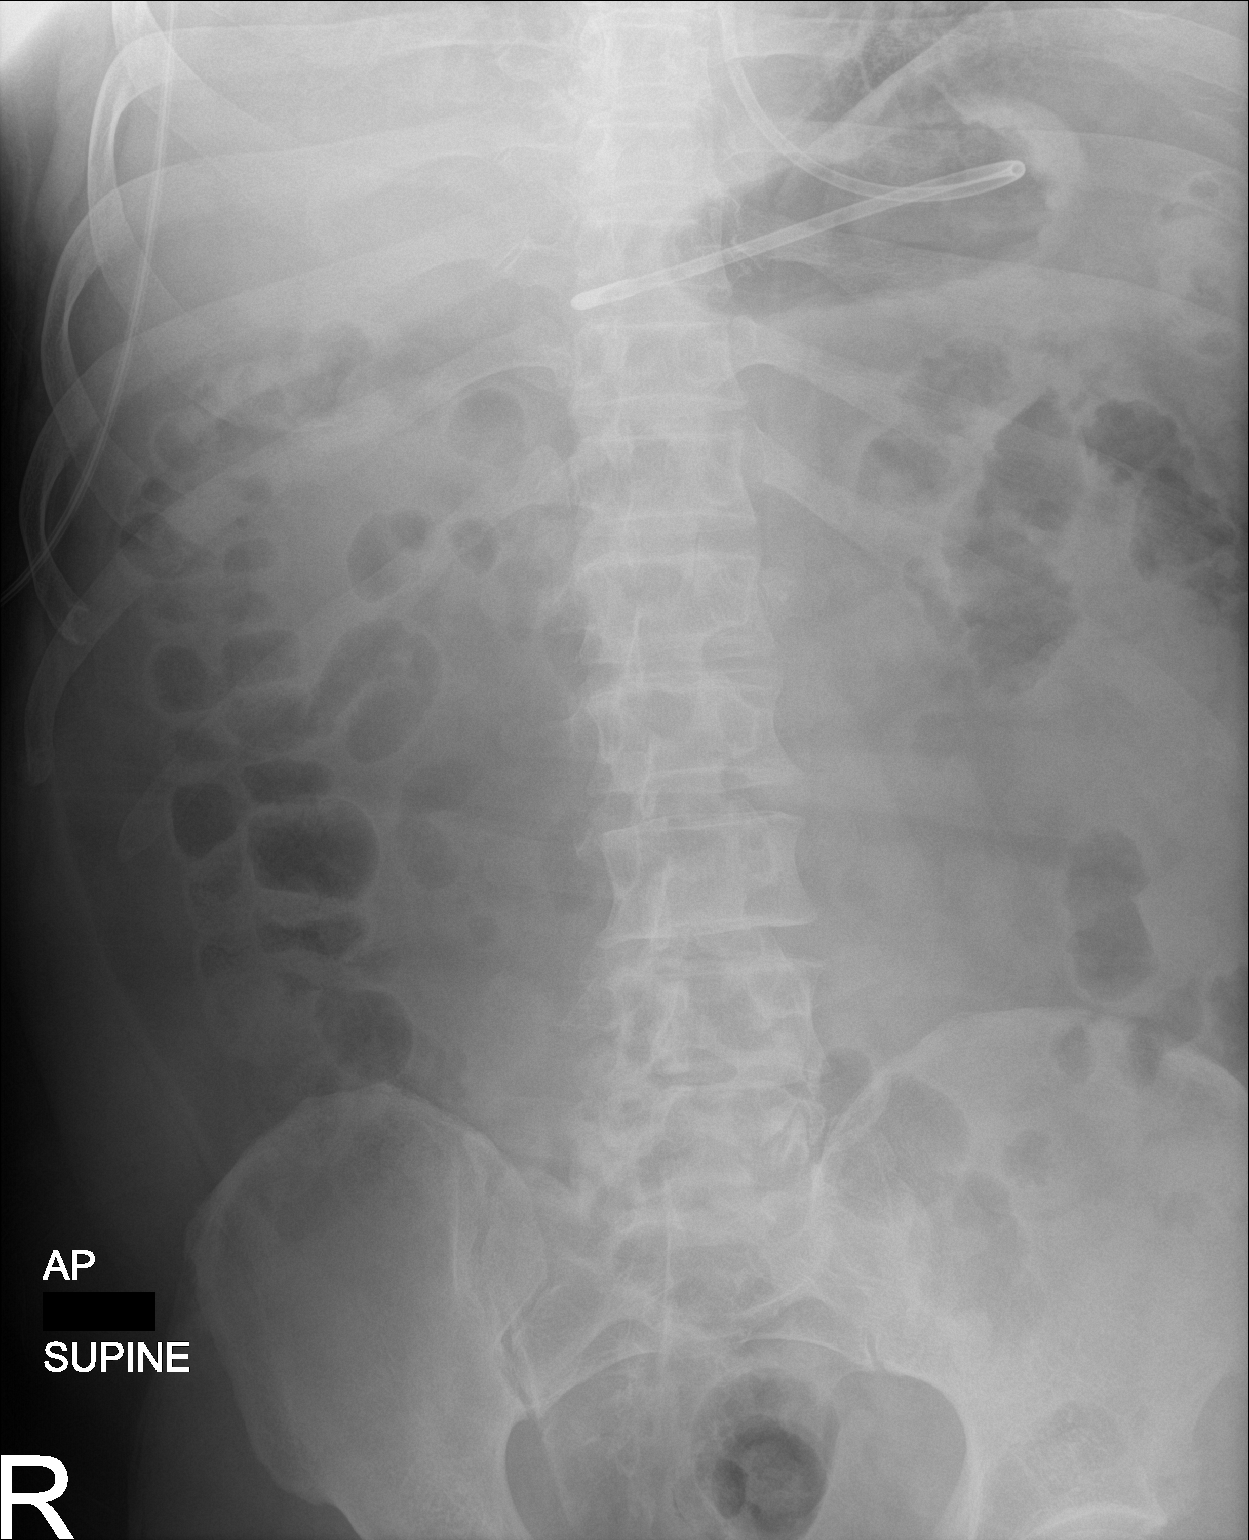

[1 of 1 positions shown; findings below may reference images not displayed]

FINDINGS: The bowel gas pattern is normal. Distal tip of feeding tube is seen
in expected position of distal stomach. No radio-opaque calculi or
other significant radiographic abnormality are seen.
IMPRESSION: Distal tip feeding tube seen in expected position of distal stomach.
No abnormal bowel dilatation is noted.

## 2022-02-15 SURGERY — ECHOCARDIOGRAM, TRANSESOPHAGEAL
Anesthesia: Monitor Anesthesia Care

## 2022-02-15 MED ORDER — SODIUM CHLORIDE 0.9 % IV SOLN: INTRAVENOUS | Status: DC

## 2022-02-15 MED ORDER — PROPOFOL 500 MG/50ML IV EMUL
INTRAVENOUS | Status: DC | PRN
  Administered 2022-02-15: 100 ug/kg/min via INTRAVENOUS

## 2022-02-15 MED ORDER — SODIUM CHLORIDE 0.9 % IV SOLN
INTRAVENOUS | Status: AC | PRN
  Administered 2022-02-15: 500 mL via INTRAVENOUS

## 2022-02-15 MED ORDER — PROPOFOL 10 MG/ML IV BOLUS
INTRAVENOUS | Status: DC | PRN
  Administered 2022-02-15: 35 mg via INTRAVENOUS
  Administered 2022-02-15: 20 mg via INTRAVENOUS

## 2022-02-15 MED ORDER — LIDOCAINE 2% (20 MG/ML) 5 ML SYRINGE
INTRAMUSCULAR | Status: DC | PRN
  Administered 2022-02-15: 40 mg via INTRAVENOUS
  Administered 2022-02-15: 60 mg via INTRAVENOUS

## 2022-02-15 NOTE — Progress Notes (Signed)
? ? ?Referring Physician(s): ?Dr. Derry Lory ? ?Supervising Physician: Baldemar Lenis ? ?Patient Status:  [facility] ? ?Chief Complaint: ?Code Stroke ? ?Subjective: ?To bedside alongside Dr. Tommie Sams. ?Remains aphasic, NGT in place due to poor swallowing function.   ?Discussed with family at bedside.  ? ?Allergies: ?Patient has no allergy information on record. ? ?Medications: ?Prior to Admission medications   ?Medication Sig Start Date End Date Taking? Authorizing Provider  ?Calcium Carbonate Antacid (TUMS PO) Take 1-2 tablets by mouth daily as needed (acid reflux/indigestion).   Yes [provider]  ? ? ? ?Vital Signs: ?BP 125/90 (BP Location: Left Arm)   Pulse 88   Temp 98.8 ?F (37.1 ?C) (Oral)   Resp 16   Ht 5\' 6"  (1.676 m)   Wt 241 lb 2.9 oz (109.4 kg)   SpO2 94%   BMI 38.93 kg/m?  ? ?Physical Exam ?Vitals and nursing note reviewed.  ?Constitutional:   ?   General: He is not in acute distress. ?   Appearance: Normal appearance. He is not ill-appearing.  ?HENT:  ?   Mouth/Throat:  ?   Mouth: Mucous membranes are moist.  ?   Pharynx: Oropharynx is clear.  ?Cardiovascular:  ?   Rate and Rhythm: Normal rate and regular rhythm.  ?Pulmonary:  ?   Effort: Pulmonary effort is normal.  ?   Breath sounds: Normal breath sounds.  ?Neurological:  ?   Mental Status: He is alert.  ?   Comments: Aphasia remains, able to intermittently vocalize, ongoing weakness.   ? ? ?Imaging: ?DG Abd 1 View ? ?Result Date: 02/15/2022 ?CLINICAL DATA:  Feeding tube placement. EXAM: ABDOMEN - 1 VIEW COMPARISON:  None. FINDINGS: The bowel gas pattern is normal. Distal tip of feeding tube is seen in expected position of distal stomach. No radio-opaque calculi or other significant radiographic abnormality are seen. IMPRESSION: Distal tip feeding tube seen in expected position of distal stomach. No abnormal bowel dilatation is noted. Electronically Signed   By: Lupita Raider M.D.   On: 02/15/2022 11:56   ? ?CT HEAD WO CONTRAST ( ) ? ?Result Date: 02/12/2022 ?CLINICAL DATA:  Provided history: Neuro deficit, acute, stroke suspected. 24 hours post TNKase. EXAM: CT HEAD WITHOUT CONTRAST TECHNIQUE: Contiguous axial images were obtained from the base of the skull through the vertex without intravenous contrast. RADIATION DOSE REDUCTION: This exam was performed according to the departmental dose-optimization program which includes automated exposure control, adjustment of the mA and/or kV according to patient size and/or use of iterative reconstruction technique. COMPARISON:  Prior head CT examinations performed 02/11/2022. CT angiogram head/neck 02/11/2022. Brain MRI 02/11/2022. FINDINGS: Brain: Cerebral volume is normal. Redemonstrated known acute left MCA territory infarcts within the left frontal, parietal and temporal lobes as well as left insula and left basal ganglia. Since the most recent prior head CT of 02/11/2022, there has been slight further demarcation of these infarcts (for instance at the left insula). As before, there is superimposed contrast staining within the left basal ganglia. No significant mass effect at this time. Persistent small-volume hyperdensity along the posterior aspect of the left sylvian fissure, likely reflecting subarachnoid hemorrhage. No evidence of an intracranial mass. No midline shift. Vascular: Redemonstrated stents within proximal left MCA vessels. Atherosclerotic calcifications. Skull: Normal. Negative for fracture or focal lesion. Sinuses/Orbits: Visualized orbits show no acute finding. Mild mucosal thickening and fluid scattered within the bilateral ethmoid sinuses. Mild mucosal thickening within the right sphenoid sinus. Moderate/severe mucosal  thickening within the left sphenoid sinus with associated chronic reactive osteitis. Trace mucosal thickening within the right maxillary sinus at the imaged levels. Trace mucosal thickening and small fluid level within the left  maxillary sinus at the imaged levels. IMPRESSION: 1. Redemonstrated acute left MCA territory infarcts. Since the most recent prior head CT of 02/11/2022, there has been slight further demarcation of these infarcts (for instance at the left insula). 2. Persistent small-volume hyperdensity along the posterior aspect of the left Sylvian fissure, likely reflecting acute subarachnoid hemorrhage. 3. Paranasal sinus disease, as described. Electronically Signed   By: Kellie Simmering D.O.   On: 02/12/2022 09:32  ? ?CT HEAD WO CONTRAST ? ?Result Date: 02/11/2022 ?CLINICAL DATA:  Follow-up examination for stroke, status post TNK and mechanical thrombectomy. EXAM: CT HEAD WITHOUT CONTRAST TECHNIQUE: Contiguous axial images were obtained from the base of the skull through the vertex without intravenous contrast. RADIATION DOSE REDUCTION: This exam was performed according to the departmental dose-optimization program which includes automated exposure control, adjustment of the mA and/or kV according to patient size and/or use of iterative reconstruction technique. COMPARISON:  Prior CT from earlier the same day. FINDINGS: Brain: Patchy hypodensity involving the left basal ganglia of an adjacent left frontotemporal region, consistent with evolving left MCA distribution infarct. Associated contrast staining at the left basal ganglia. More dense hyperdensity at the posterior margin of the left sylvian fissure likely reflects a small amount of acute subarachnoid hemorrhage (series 3, image 19). No significant regional mass effect at this time. No other acute intracranial hemorrhage or large vessel territory infarct. No mass lesion or midline shift. No hydrocephalus or extra-axial fluid collection. Vascular: Interval placement of a Y stent into the distal left MCA/proximal MCA branches. No visible hyperdense vessel. Skull: Scalp soft tissues and calvarium demonstrate no new abnormality. Sinuses/Orbits: Globes and orbital soft tissues  demonstrate no acute finding. Mucosal thickening noted within the sphenoid ethmoidal sinuses. Patient is intubated. Trace left mastoid effusion. Other: None. IMPRESSION: 1. Evolving left MCA distribution infarct as above. Small volume hyperdensity at the posterior margin of the left Sylvian fissure likely reflects acute subarachnoid hemorrhage. No significant regional mass effect at this time. 2. Interval stenting of the proximal left MCA branches. 3. No other new acute intracranial abnormality. These results were communicated to Dr. Leonel Ramsay at 10:52 pm on 02/11/2022 by text page via the [facility] messaging system. Electronically Signed   By: Jeannine Boga M.D.   On: 02/11/2022 22:53  ? ?MR BRAIN WO CONTRAST ? ?Result Date: 02/14/2022 ?CLINICAL DATA:  Follow-up examination for acute stroke. EXAM: MRI HEAD WITHOUT CONTRAST TECHNIQUE: Multiplanar, multiecho pulse sequences of the brain and surrounding structures were obtained without intravenous contrast. COMPARISON:  Comparison made with prior CT from 02/12/2022 as well as previous exams. FINDINGS: Brain: Extensive patchy and confluent restricted diffusion seen throughout the left MCA distribution, consistent with evolving acute left MCA distribution infarct. Overall size and involvement is markedly increased as compared to previous MRI. Involvement is most pronounced at the basal ganglia and insula where diffusion signal is fairly confluent in nature. Associated gyral swelling and edema without significant regional mass effect or midline shift. Evidence for associated subarachnoid hemorrhage at the posterior aspect of the left sylvian fissure again noted, stable. Few additional scattered foci of petechial blood products noted elsewhere throughout the areas of infarction. Few additional cortical to subcortical infarcts noted within the contralateral right frontal lobe (series 5, images 96, 92). Few additional patchy small volume left ACA territory  infarcts noted  as well (series 5, image 96, 94). Underlying chronic microvascular ischemic disease again noted. No mass lesion. No hydrocephalus or extra-axial fluid collection. Vascular: Major intracranial vascular flow voids are m

## 2022-02-15 NOTE — Progress Notes (Addendum)
STROKE TEAM PROGRESS NOTE  ? ?INTERVAL HISTORY ?Patient is seen in his room after his TEE. Interventional radiology team at the bedside with wife.  ?Gordon Fisher is able to mimic with bilateral lower extremities and left upper extremity. Flaccid RUE. Used suction swab to brush teeth without assistance. SLP at the bedside as well.  ? ?Vitals:  ? 02/15/22 1015 02/15/22 1130 02/15/22 1145 02/15/22 1214  ?BP: 140/81 96/74 102/87 125/90  ?Pulse: 96 (!) 109 97 88  ?Resp: (!) 25 (!) 26 (!) 21 16  ?Temp: 98.1 ?F (36.7 ?C) 99.5 ?F (37.5 ?C) 99.5 ?F (37.5 ?C) 98.8 ?F (37.1 ?C)  ?TempSrc: Temporal   Oral  ?SpO2: 92% 92% 93% 94%  ?Weight:      ?Height:      ? ?CBC:  ?Recent Labs  ?Lab 02/11/22 ?0654 02/11/22 ?2108 02/12/22 ?0421 02/13/22 ?[id number] 02/14/22 ?[id number] 02/15/22 ?0155  ?WBC 7.8  --  15.3*   < > 10.3 11.4*  ?NEUTROABS 2.8  --  10.4*  --   --   --   ?HGB 16.4   < > 15.3   < > 15.3 16.3  ?HCT 48.8   < > 45.1   < > 44.5 45.5  ?MCV 91.6  --  93.6   < > 90.8 89.9  ?PLT 181  --  170   < > 175 182  ? < > = values in this interval not displayed.  ? ? ?Basic Metabolic Panel:  ?Recent Labs  ?Lab 02/14/22 ?0248 02/15/22 ?0155  ?NA 134* 134*  ?K 3.5 4.0  ?CL 104 105  ?CO2 23 20*  ?GLUCOSE 153* 111*  ?BUN 14 17  ?CREATININE 0.94 0.93  ?CALCIUM 8.5* 8.8*  ? ? ?Lipid Panel:  ?Recent Labs  ?Lab 02/12/22 ?0421  ?CHOL 198  ?TRIG 146  ?HDL 47  ?CHOLHDL 4.2  ?VLDL 29  ?[place] 122*  ? ? ?HgbA1c:  ?Recent Labs  ?Lab 02/11/22 ?0654  ?HGBA1C 6.0*  ? ? ?Urine Drug Screen:  ?Recent Labs  ?Lab 02/11/22 ?1608  ?LABOPIA NONE DETECTED  ?COCAINSCRNUR NONE DETECTED  ?LABBENZ NONE DETECTED  ?AMPHETMU NONE DETECTED  ?THCU NONE DETECTED  ?LABBARB NONE DETECTED  ? ?  ?Alcohol Level  ?Recent Labs  ?Lab 02/11/22 ?0654  ?ETH <10  ? ? ? ?IMAGING past 24 hours ?DG Abd 1 View ? ?Result Date: 02/15/2022 ?CLINICAL DATA:  Feeding tube placement. EXAM: ABDOMEN - 1 VIEW COMPARISON:  None. FINDINGS: The bowel gas pattern is normal. Distal tip of feeding tube is seen in  expected position of distal stomach. No radio-opaque calculi or other significant radiographic abnormality are seen. IMPRESSION: Distal tip feeding tube seen in expected position of distal stomach. No abnormal bowel dilatation is noted. Electronically Signed   By: Marijo Conception M.D.   On: 02/15/2022 11:56   ? ?PHYSICAL EXAM ?General:  Well-developed, well-nourished middle-aged Caucasian male patient in no acute distress ?Respiratory:  Respirations regular and unlabored ? ?NEURO:  ?Mental Status: difficult to assess due to aphasia, unable to follow commands, able to mimic ?Speech/Language: Expressive and receptive aphasia present.  Patient is able to say "yes" but does not appear to understand what is being said ? ?Cranial Nerves:  ?II: PERRL.  ?III, IV, VI: EOMI. Eyelids elevate symmetrically.  ?VII: Right sided facial droop present ?VIII: hearing intact to voice. ?IX, X: Phonation is normal.  ?Motor: Able to move LUE and LLE spontaneously. RUE moves to noxious stimuli, holds RLE against gravity with foot on  the bed.  ?Gait- deferred ? ? ?ASSESSMENT/PLAN ?Gordon Fisher is a 61 y.o. male with no significant medical history presenting with slurred speech and right sided weakness.  He was brought to [facility] and was given TNK then transferred here for thrombectomy.  Mechnaical thrombectomy was performed in the left M1 MCA with TICI 3 flow achieved.  Unfortunately, later that day the left M1 MCA reoccluded.  Mechanical thrombectomy was performed again with recanalization achieved but dissection of the MCA occurred.  A stent was placed, but this unfortunately occluded and was unable to be recanalized.  Patient has remained intubated after the procedure and was extubated on 4/14.  He remains aphasic with right hemiplegia. TEE done 4/17, negative bubble study, no cardiac thrombus. ? 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 Mild HT in left parietal lobe ?S/p IR x 2 with stent placement for left M2 dissection and then stent reocclusion ?Repeat CT head 4/14 evolving left MCA distribution infarct, small SAH, stenting of proximal left MCA branches ?Repeat MRI extensive evolving acute ischemic left MCA territory infarct, small volume SAH at posterior aspect of Sylvian fissure, few small volume acute ischemic cortical and subcortical infarcts in right frontal lobe and ACA distribution ?2D Echo EF 60-65%, interatrial septum not well visualized ?TEE 4/17- negative bubble study, no cardiac thrombus ?Discussed with wife, she prefers Zio patch on discharge to rule out afib ?hypercoagulable work up pending ?LDL 122 ?HgbA1c 6.0 ?VTE prophylaxis - SCDs ?No antithrombotic prior to admission, now on aspirin 81 mg daily and Brilinta (ticagrelor) 90 mg bid.  ?Therapy recommendations:  CIR ?Disposition:  pending ? ?Hypertension ?Home meds:  none ?Stable ?SBP <160 now ?Off cleviprex ?Long-term BP goal normotensive ? ?Hyperlipidemia ?Home meds:  none ?LDL 122, goal < 70 ?Add atorvastatin 40 mg  ?Continue statin at discharge ? ? ?Other Stroke Risk Factors ?Obesity, Body mass index is 38.93 kg/m?., BMI >/= 30 associated with increased stroke risk, recommend weight loss, diet and exercise as appropriate  ? ?Other Active Problems ?none ? ?Hospital day # 4 ? ?Patient seen and examined by NP/APP with MD. MD to update note as needed.  ? ?Janine Ores, DNP, FNP-BC ?[facility] ?Pager: [phone]  ? ?ATTENDING NOTE: ?I reviewed above note and agree with the assessment and plan. Pt was seen and examined.  ? ?Wife at bedside.  Patient lying bed, still has global aphasia and  right hemiparesis.  TEE today showed no LV thrombus, no PFO.  Discussed with wife, she prefers Zio patch for heart monitoring at the first step.  She denies any family history of stroke, or heart disease.  Patient has no history of heart disease, complaining of palpitation, smoking or alcohol.  PT/OT recommend CIR.  CR coordinator is working on Insurance underwriter. ? ?For detailed assessment and plan, please refer to above as I have made changes wherever appropriate.  ? ?Rosalin Hawking, MD PhD ?Stroke Neurology ?02/15/2022 ?6:55 PM ? ? ? ? ?To contact Stroke Continuity provider, please refer to [email or web address] ?After hours, contact General Neurology  ?

## 2022-02-15 NOTE — Transfer of Care (Signed)
Immediate Anesthesia Transfer of Care Note ? ?Patient: Gordon Fisher ? ?Procedure(s) Performed: TRANSESOPHAGEAL ECHOCARDIOGRAM (TEE) ?BUBBLE STUDY ? ?Patient Location: PACU ? ?Anesthesia Type:MAC ? ?Level of Consciousness: drowsy, patient cooperative and responds to stimulation ? ?Airway & Oxygen Therapy: Patient Spontanous Breathing and Patient connected to nasal cannula oxygen ? ?Post-op Assessment: Report given to RN and Post -op Vital signs reviewed and stable ? ?Post vital signs: Reviewed and stable ? ?Last Vitals:  ?Vitals Value Taken Time  ?BP 96/74 02/15/22 1131  ?Temp    ?Pulse 109 02/15/22 1132  ?Resp 23 02/15/22 1132  ?SpO2 90 % 02/15/22 1132  ?Vitals shown include unvalidated device data. ? ?Last Pain:  ?Vitals:  ? 02/15/22 1015  ?TempSrc: Temporal  ?PainSc:   ?   ? ?  ? ?Complications: No notable events documented. ?

## 2022-02-15 NOTE — Progress Notes (Signed)
SLP Cancellation Note ? ?Patient Details ?Name: Gordon Fisher ?MRN: [id number] ?DOB: 02/19/1961 ? ? ?Cancelled treatment:       Reason Eval/Treat Not Completed: Other (comment) (NPO for TEE today.  Will follow for bedside swallow reassessment vs instrumental swalllow study) ? ? ?Gordon Fisher ?02/15/2022, 9:18 AM ? ?Michiah Mudry L. Ayad Nieman, MA CCC/SLP ?Acute Rehabilitation Services ?Office number [phone] ?Pager [phone] ? ?

## 2022-02-15 NOTE — Anesthesia Preprocedure Evaluation (Signed)
Anesthesia Evaluation  ?Patient identified by MRN, date of birth, ID bandGeneral Assessment Comment:awake ? ?Reviewed: ?Allergy & Precautions, NPO status , Patient's Chart, lab work & pertinent test results ? ?History of Anesthesia Complications ?Negative for: history of anesthetic complications ? ?Airway ?Mallampati: III ? ?TM Distance: >3 FB ?Neck ROM: Full ? ? ?Comment: Unable to assess 2/2 pt cooperation Dental ? ?(+) Teeth Intact ?  ?Pulmonary ?neg pulmonary ROS,  ?  ?breath sounds clear to auscultation ? ? ? ? ? ? Cardiovascular ?negative cardio ROS ? ? ?Rhythm:Regular Rate:Normal ? ? ?  ?Neuro/Psych ?CVA, Residual Symptoms negative psych ROS  ? GI/Hepatic ?negative GI ROS, Neg liver ROS,   ?Endo/Other  ?negative endocrine ROS ? Renal/GU ?negative Renal ROS  ?negative genitourinary ?  ?Musculoskeletal ?negative musculoskeletal ROS ?(+)  ? Abdominal ?  ?Peds ? Hematology ?negative hematology ROS ?(+) Lab Results ?     Component                Value               Date                 ?     WBC                      11.4 (H)            02/15/2022           ?     HGB                      16.3                02/15/2022           ?     HCT                      45.5                02/15/2022           ?     MCV                      89.9                02/15/2022           ?     PLT                      182                 02/15/2022           ?   ?Anesthesia Other Findings ?Can follow commands but no intelligible speech. No movement right side.  ? Reproductive/Obstetrics ? ?  ? ? ? ? ? ? ? ? ? ? ? ? ? ?  ?  ? ? ? ? ? ? ? ? ?Anesthesia Physical ?Anesthesia Plan ? ?ASA: 3 ? ?Anesthesia Plan: MAC  ? ?Post-op Pain Management: Minimal or no pain anticipated  ? ?Induction: Intravenous ? ?PONV Risk Score and Plan: 1 and Propofol infusion and Treatment may vary due to age or medical condition ? ?Airway Management Planned: Nasal Cannula ? ?Additional Equipment: None ? ?Intra-op Plan:   ? ?Post-operative Plan:  ? ?Informed Consent:  ? ? ? ?Dental advisory given ? ?Plan Discussed with: CRNA ? ?  Anesthesia Plan Comments:   ? ? ? ? ? ? ?Anesthesia Quick Evaluation ? ?

## 2022-02-15 NOTE — Progress Notes (Signed)
Echocardiogram ?Echocardiogram Transesophageal has been performed. ? ?Oneal Deputy Buffy Ehler RDCS ?02/15/2022, 12:01 PM ?

## 2022-02-15 NOTE — Progress Notes (Signed)
Speech Language Pathology Treatment: Dysphagia;Cognitive-Linquistic  ?Patient Details ?Name: Gordon Fisher ?MRN: [id number] ?DOB: 09-Dec-1960 ?Today's Date: 02/15/2022 ?Time: 3614-4315 ?SLP Time Calculation (min) (ACUTE ONLY): 31 min ? ?Assessment / Plan / Recommendation ?Clinical Impression ? [name] presents with a global aphasia, producing "yeah" in perseveratory form for all communication, but varying prosody.  He did not follow commands but was able to imitate some motor movements.  He required total multimodal cues to discriminate between two common objects with hand over hand assist and repetition. He was not able to alter verbal output or modify sounds with max assist. RE: swallowing, when given toothbrush/[place] he was able to brush teeth with min assist. He accepted ice chips and sips of water with concerns for delays and sensory deficits masking true function.  Cortrak present for nutrition, but he will benefit from [facility] for assessment of physiology. Discussed swallowing and language deficits at length with his wife, who was at bedside. Reviewed ways to help with basic communication and swallowing.  ? ?SLP will follow whil on acute care. ? ?Please allow occasional ice chips; his wife may feed him. MBS pending. ? ?   ?HPI HPI: 61 y.o. male with no significant PMH who presented to [facility] on 02/11/22 with R sided weakness, R facial droop, slurred speech. He got up at 0515 on 02/11/22 and walked to the bathroom by himself, used the bathroom and went back to sleep. He woke up about 30 mins later with R sided weakness and slurred speech.     He was brought in to [facility] by EMS for further evaluation. Was evaluated by Telestroke and CTH w/o contrast with hyperdense L MCA with ASPECTS of 10. He was given tNKASE at 0721. CTA with L MCA M1 . Patient was transferred to [facility] for NIR thrombectomy.     Initial NIR procedure demonstrated occlusion of the distal left M1 MCA; mechanical  thrombectomy was completed with TICI 3 recanalization. Post-procedure CT and MRI Brain were completed. Several hours postprocedure, patient experienced recurrence of neurologic deficits prompting return to NIR; procedure demonstrated distal L M1 occlusion prompting repeat thrombectomy and stent placement x 2. Stent unfortunately became occluded and was unable to be crossed. Patient returned to ICU with cangrelor gtt; SLE/BSE generated ?  ?   ?SLP Plan ? Continue with current plan of care ? ?  ?  ?Recommendations for follow up therapy are one component of a multi-disciplinary discharge planning process, led by the attending physician.  Recommendations may be updated based on patient status, additional functional criteria and insurance authorization. ?  ? ?Recommendations  ?Diet recommendations: NPO (may have occasional ice chips)  ?   ?    ?   ? ? ? ? Oral Care Recommendations: Oral care QID ?Follow Up Recommendations: Acute inpatient rehab (3hours/day) ?Assistance recommended at discharge: Frequent or constant Supervision/Assistance ?SLP Visit Diagnosis: Aphasia (R47.01);Dysphagia, oropharyngeal phase (R13.12) ?Plan: Continue with current plan of care ? ? ? ? ?  ? Casidee Jann L. Derrich Gaby, MA CCC/SLP ?Acute Rehabilitation Services ?Office number [phone] ?Pager [phone] ? ? ? ?Blenda Mounts Laurice ? ?02/15/2022, 4:25 PM ?

## 2022-02-15 NOTE — Progress Notes (Addendum)
? ? ?  [facility] has been requested to perform a transesophageal echocardiogram on MR. Kofoed for stroke.  After careful review of history and examination, the risks and benefits of transesophageal echocardiogram have been explained including risks of esophageal damage, perforation (1:10,000 risk), bleeding, pharyngeal hematoma as well as other potential complications associated with conscious sedation including aspiration, arrhythmia, respiratory failure and death. Alternatives to treatment were discussed, questions were answered. Patient and wife (Ms. Shkolnik) willing to proceed.  ?TEE -TODAY. KEEP NPO.  ? ?Leanor Kail, PA-C ?02/15/2022 8:54 AM   ?

## 2022-02-15 NOTE — CV Procedure (Signed)
? ? ?  TRANSESOPHAGEAL ECHOCARDIOGRAM  ? ?NAME:  Gordon Fisher    ?MRN: [id number] ?DOB:  06/12/1961    ?ADMIT DATE: 02/11/2022 ? ?INDICATIONS: ?Stroke ? ?PROCEDURE:  ? ?Informed consent was obtained prior to the procedure. The risks, benefits and alternatives for the procedure were discussed and the patient's wife comprehended these risks.  Risks include, but are not limited to, cough, sore throat, vomiting, nausea, somnolence, esophageal and stomach trauma or perforation, bleeding, low blood pressure, aspiration, pneumonia, infection, trauma to the teeth and death.   ? ?Procedural time out performed. The oropharynx was anesthetized with topical 1% benzocaine.   ? ?Anesthesia was administered by Dr. Jones Skene and team.  [name] patient's heart rate, blood pressure, and oxygen saturation are monitored continuously during the procedure. The period of conscious sedation is 9 minutes, of which I was present face-to-face 100% of this time.  ? ?The transesophageal probe was inserted in the esophagus and stomach without difficulty and multiple views were obtained.  ? ?COMPLICATIONS:   ? ?There were no immediate complications. ? ?KEY FINDINGS: ? ?Negative bubble study. ?No cardiac thrombus. ?Full report to follow. ?Further management per primary team.  ? ?Rudean Haskell, MD ?[place]  ?11:31 AM ? ? ?

## 2022-02-15 NOTE — Interval H&P Note (Signed)
History and Physical Interval Note: ? ?02/15/2022 ?11:05 AM ? ?Gordon Fisher  has presented today for surgery, with the diagnosis of stroke.  The various methods of treatment have been discussed with the patient and family. After consideration of risks, benefits and other options for treatment, the patient has consented to  Procedure(s): ?TRANSESOPHAGEAL ECHOCARDIOGRAM (TEE) (N/A) as a surgical intervention.  The patient's history has been reviewed, patient examined, no change in status, stable for surgery.  I have reviewed the patient's chart and labs.  Questions were answered to the patient's satisfaction.   ? ? ?Henriette Hesser A Kawhi Diebold ? ? ?

## 2022-02-15 NOTE — Anesthesia Procedure Notes (Signed)
Procedure Name: [place] ?Date/Time: 02/15/2022 11:12 AM ?Performed by: Jenne Campus, CRNA ?Pre-anesthesia Checklist: Patient identified, Emergency Drugs available, Suction available and Patient being monitored ?Patient Re-evaluated:Patient Re-evaluated prior to induction ?Oxygen Delivery Method: Nasal cannula ? ? ? ? ?

## 2022-02-15 NOTE — Progress Notes (Signed)
PT Cancellation Note ? ?Patient Details ?Name: Gordon Fisher ?MRN: [id number] ?DOB: 1961/03/31 ? ? ?Cancelled Treatment:    Reason Eval/Treat Not Completed: Patient at procedure or test/unavailable. Pt off floor at TEE.  Acute PT to return as able to progress mobility. ? ?Kittie Plater, PT, DPT ?Acute Rehabilitation Services ?Secure chat preferred ?Office #: [phone] ? ? ? ?Jewel Venditto M Jyra Lagares ?02/15/2022, 11:28 AM ? ? ?

## 2022-02-16 ENCOUNTER — Encounter (HOSPITAL_COMMUNITY): Payer: Self-pay | Admitting: Internal Medicine

## 2022-02-16 ENCOUNTER — Inpatient Hospital Stay (HOSPITAL_COMMUNITY): Payer: 59

## 2022-02-16 LAB — LUPUS ANTICOAGULANT PANEL
DRVVT: 37.8 s (ref 0.0–47.0)
PTT Lupus Anticoagulant: 34.2 s (ref 0.0–43.5)

## 2022-02-16 LAB — GLUCOSE, CAPILLARY
Glucose-Capillary: 145 mg/dL — ABNORMAL HIGH (ref 70–99)
Glucose-Capillary: 165 mg/dL — ABNORMAL HIGH (ref 70–99)
Glucose-Capillary: 180 mg/dL — ABNORMAL HIGH (ref 70–99)
Glucose-Capillary: 187 mg/dL — ABNORMAL HIGH (ref 70–99)
Glucose-Capillary: 200 mg/dL — ABNORMAL HIGH (ref 70–99)
Glucose-Capillary: 214 mg/dL — ABNORMAL HIGH (ref 70–99)

## 2022-02-16 LAB — CBC
HCT: 45.6 % (ref 39.0–52.0)
Hemoglobin: 16.5 g/dL (ref 13.0–17.0)
MCH: 32.4 pg (ref 26.0–34.0)
MCHC: 36.2 g/dL — ABNORMAL HIGH (ref 30.0–36.0)
MCV: 89.6 fL (ref 80.0–100.0)
Platelets: 195 10*3/uL (ref 150–400)
RBC: 5.09 MIL/uL (ref 4.22–5.81)
RDW: 12.3 % (ref 11.5–15.5)
WBC: 9.7 10*3/uL (ref 4.0–10.5)
nRBC: 0 % (ref 0.0–0.2)

## 2022-02-16 LAB — BASIC METABOLIC PANEL
Anion gap: 11 (ref 5–15)
BUN: 22 mg/dL — ABNORMAL HIGH (ref 6–20)
CO2: 21 mmol/L — ABNORMAL LOW (ref 22–32)
Calcium: 8.7 mg/dL — ABNORMAL LOW (ref 8.9–10.3)
Chloride: 100 mmol/L (ref 98–111)
Creatinine, Ser: 1.06 mg/dL (ref 0.61–1.24)
GFR, Estimated: >60 mL/min (ref >60)
Glucose, Bld: 176 mg/dL — ABNORMAL HIGH (ref 70–99)
Potassium: 3.8 mmol/L (ref 3.5–5.1)
Sodium: 132 mmol/L — ABNORMAL LOW (ref 135–145)

## 2022-02-16 MED ORDER — CHLORHEXIDINE GLUCONATE 0.12 % MT SOLN
15.0000 mL | Freq: Two times a day (BID) | OROMUCOSAL | Status: DC
  Administered 2022-02-16 – 2022-02-18 (×4): 15 mL via OROMUCOSAL
  Filled 2022-02-16 (×4): qty 15

## 2022-02-16 MED ORDER — ORAL CARE MOUTH RINSE
15.0000 mL | Freq: Two times a day (BID) | OROMUCOSAL | Status: DC
  Administered 2022-02-17 – 2022-02-18 (×3): 15 mL via OROMUCOSAL

## 2022-02-16 NOTE — Anesthesia Postprocedure Evaluation (Signed)
Anesthesia Post Note ? ?Patient: Gordon Fisher ? ?Procedure(s) Performed: TRANSESOPHAGEAL ECHOCARDIOGRAM (TEE) ?BUBBLE STUDY ? ?  ? ?Patient location during evaluation: Endoscopy ?Anesthesia Type: MAC ?Level of consciousness: patient cooperative and awake ?Pain management: pain level controlled ?Vital Signs Assessment: post-procedure vital signs reviewed and stable ?Respiratory status: spontaneous breathing, nonlabored ventilation, respiratory function stable and patient connected to nasal cannula oxygen ?Cardiovascular status: stable and blood pressure returned to baseline ?Postop Assessment: no apparent nausea or vomiting ?Anesthetic complications: no ? ? ?No notable events documented. ? ?Last Vitals:  ?Vitals:  ? 02/16/22 1550 02/16/22 1928  ?BP: 129/83 132/83  ?Pulse: 96 (!) 101  ?Resp: 18   ?Temp: 36.7 ?C 37.1 ?C  ?SpO2: 94% 94%  ?  ?Last Pain:  ?Vitals:  ? 02/16/22 1928  ?TempSrc: Oral  ?PainSc:   ? ? ?  ?  ?  ?  ?  ?  ? ?Jacquelinne Speak ? ? ? ? ?

## 2022-02-16 NOTE — Progress Notes (Signed)
Physical Therapy Treatment ?Patient Details ?Name: Gordon Fisher ?MRN: [id number] ?DOB: 10/29/1961 ?Today's Date: 02/16/2022 ? ? ?History of Present Illness 61 y.o. male presents to [facility] hospital on 02/11/2022 with R weakness, facial droop and slurred speech. [place] dmonstrates L MCA. Pt given tNK and underwent thrombectomy on 4/13 with revascularization. Pt with recurrence of symptoms, returned to IR with stent placement x2 however stent became occluded. Pt remained intubated after procedure. No significant PMH noted. ? ?  ?PT Comments  ? ? Focused on EOB balance, R UE loading at EOB, and dynamic reaching with L UE across body to the R. 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 Pt requiring tactile cues to turn head to the R and max verbal cues to follow with his eyes to find objects on the R. Pt to strongly benefit from aggressive rehab program to achieve maximal functional recovery to decrease burden of care on spouse/family prior to transition home. ?   ?Recommendations for follow up therapy are one component of a multi-disciplinary discharge planning process, led by the attending physician.  Recommendations may be updated based on patient status, additional functional criteria and insurance authorization. ? ?Follow Up Recommendations ? Acute inpatient rehab (3hours/day) ?  ?  ?Assistance Recommended at Discharge Frequent or constant Supervision/Assistance  ?Patient can return home with the following Two people to help with walking and/or transfers;Two people to help with bathing/dressing/bathroom;Assistance with cooking/housework;Assistance with feeding;Direct supervision/assist for medications management;Assist for transportation;Direct supervision/assist for financial management;Help with stairs or ramp for entrance ?  ?Equipment Recommendations ? Wheelchair (measurements PT);Wheelchair cushion (measurements PT);Hospital bed (hoyer lift)  ?  ?Recommendations for Other Services Rehab consult ? ? ?  ?Precautions  / Restrictions Precautions ?Precautions: Fall ?Precaution Comments: R neglect and hemiplegia ?Restrictions ?Weight Bearing Restrictions: No  ?  ? ?Mobility ? Bed Mobility ?Overal bed mobility: Needs Assistance ?Bed Mobility: Supine to Sit, Sit to Supine ?  ?  ?Supine to sit: +2 for physical assistance, Max assist ?Sit to supine: +2 for physical assistance, Max assist ?  ?General bed mobility comments: pt with no use of R UE or LE functionally despite max verbal and tactile cues, maxA to roll to the L and elevated trunk, maxA for trunk control and LE management back into bed ?  ? ?Transfers ?Overall transfer level: Needs assistance ?Equipment used: 2 person hand held assist (face to face transfer with gait belt and bed pad) ?Transfers: Sit to/from Stand ?Sit to Stand: Max assist, +2 physical assistance ?  ?  ?  ?  ?  ?General transfer comment: R knee block, unable to achieve full upright standing ?  ? ?Ambulation/Gait ?  ?  ?  ?  ?  ?  ?  ?  ? ? ?Stairs ?  ?  ?  ?  ?  ? ? ?Wheelchair Mobility ?  ? ?Modified Rankin (Stroke Patients Only) ?Modified Rankin (Stroke Patients Only) ?Pre-Morbid Rankin Score: No symptoms ?Modified Rankin: Severe disability ? ? ?  ?Balance Overall balance assessment: Needs assistance ?Sitting-balance support: Single extremity supported, Feet supported ?Sitting balance-Leahy Scale: Poor ?Sitting balance - Comments: max-totalA initially with posterior and R lateral lean. Pt improves with time, progressing to minG-minA without distraction. With distraction or 2nd task added the pt does fall to right. Demonstrates awareness to attempt to correct balance but requires assistance ?Postural control: Right lateral lean ?Standing balance support: Bilateral upper extremity supported ?Standing balance-Leahy Scale: Zero ?Standing balance comment: maxA x 2, R knee block ?  ?  ?  ?  ?  ?  ?  ?  ?  ?  ?  ?  ? ?  ?  Cognition Arousal/Alertness: Awake/alert ?Behavior During Therapy: Flat affect ?Overall  Cognitive Status: Impaired/Different from baseline ?Area of Impairment: Following commands, Safety/judgement, Awareness, Problem solving ?  ?  ?  ?  ?  ?  ?  ?  ?  ?  ?  ?Following Commands: Follows one step commands inconsistently ?Safety/Judgement: Decreased awareness of safety, Decreased awareness of deficits (R neglect) ?Awareness: Intellectual ?Problem Solving: Slow processing, Decreased initiation, Difficulty sequencing, Requires tactile cues, Requires verbal cues ?General Comments: pt follows 1 step commands 50% of the time, only with L UE. Pt states yes however only accurate <25% of time, pt with no head turn to the R without tactile cues ?  ?  ? ?  ?Exercises   ? ?  ?General Comments General comments (skin integrity, edema, etc.): VSS on RA ?  ?  ? ?Pertinent Vitals/Pain Pain Assessment ?Pain Assessment: Faces ?Faces Pain Scale: No hurt  ? ? ?Home Living   ?  ?  ?  ?  ?  ?  ?  ?  ?  ?   ?  ?Prior Function    ?  ?  ?   ? ?PT Goals (current goals can now be found in the care plan section) Acute Rehab PT Goals ?Patient Stated Goal: family states goal of returning to independence ?PT Goal Formulation: With family ?Time For Goal Achievement: 02/26/22 ?Potential to Achieve Goals: Fair ?Progress towards PT goals: Progressing toward goals ? ?  ?Frequency ? ? ? Min 4X/week ? ? ? ?  ?PT Plan Current plan remains appropriate  ? ? ?Co-evaluation   ?  ?  ?  ?  ? ?  ?AM-PAC PT "6 Clicks" Mobility   ?Outcome Measure ? Help needed turning from your back to your side while in a flat bed without using bedrails?: Total ?Help needed moving from lying on your back to sitting on the side of a flat bed without using bedrails?: Total ?Help needed moving to and from a bed to a chair (including a wheelchair)?: Total ?Help needed standing up from a chair using your arms (e.g., wheelchair or bedside chair)?: Total ?Help needed to walk in hospital room?: Total ?Help needed climbing 3-5 steps with a railing? : Total ?6 Click Score:  6 ? ?  ?End of Session Equipment Utilized During Treatment: Gait belt ?Activity Tolerance: Patient tolerated treatment well ?Patient left: in bed;with call bell/phone within reach;with bed alarm set;with family/visitor present ?Nurse Communication: Mobility status;Need for lift equipment ?PT Visit Diagnosis: Other abnormalities of gait and mobility (R26.89);Other symptoms and signs involving the nervous system (R29.898);Hemiplegia and hemiparesis ?Hemiplegia - Right/Left: Right ?Hemiplegia - dominant/non-dominant: Dominant ?Hemiplegia - caused by: Cerebral infarction ?  ? ? ?Time: [id number] ?PT Time Calculation (min) (ACUTE ONLY): 33 min ? ?Charges:  $Therapeutic Activity: 8-22 mins ?$Neuromuscular Re-education: 8-22 mins          ?          ? ?Kittie Plater, PT, DPT ?Acute Rehabilitation Services ?Secure chat preferred ?Office #: [phone] ? ? ? ?Betti Goodenow M Samreet [place] ?02/16/2022, 2:25 PM ? ?

## 2022-02-16 NOTE — Progress Notes (Signed)
Inpatient Rehab Admissions Coordinator:  ? ?At this time I will place a CIR consult per our protocol and an admissions coordinator will follow up. ? ?Estill Dooms, PT, DPT ?Admissions Coordinator ?[phone] ?02/16/22  ?2:32 PM ? ?

## 2022-02-16 NOTE — Progress Notes (Signed)
Modified Barium Swallow Progress Note ? ?Patient Details  ?Name: Gordon Fisher ?MRN: [id number] ?Date of Birth: May 05, 1961 ? ?Today's Date: 02/16/2022 ? ?Modified Barium Swallow completed.  Full report located under Chart Review in the Imaging Section. ? ?Brief recommendations include the following: ? ?Clinical Impression ? [name] presented with a primary oral dysphagia marked by oral delays, decreased bolus cohesion, and premature spillage of thin liquids into the pharynx.  Laryngeal vestibule closure was effective - there was no notable penetration nor aspiration. There was good pharyngeal clearance with no residue post-swallow. Recommend starting a dysphagia 3 diet with thin liquids; give meds whole in applesauce.  Pt may need supervision to limit impulsivity and manage small bites/sips.SLP will follow. ?  ?Swallow Evaluation Recommendations ? ?   ? ? SLP Diet Recommendations: Dysphagia 3 (Mech soft) solids;Thin liquid ? ? Liquid Administration via: Cup;Straw ? ? Medication Administration: Whole meds with puree ? ? Supervision: Patient able to self feed;Full supervision/cueing for compensatory strategies ? ? Compensations: Minimize environmental distractions;Slow rate;Small sips/bites ? ?   ? ? Oral Care Recommendations: Oral care BID ? ?   ? ? ? ?Blenda Mounts Laurice ?02/16/2022,3:13 PM ?

## 2022-02-16 NOTE — Progress Notes (Addendum)
STROKE TEAM PROGRESS NOTE  ? ?INTERVAL HISTORY ?Able to mimic with bilateral lower extremities and left upper extremity. Flaccid RUE. Used suction swab to brush teeth without assistance. Plan for MBS with SLP today. Continue therapy, awaiting eval by CIR.  ? ?Vitals:  ? 02/15/22 2118 02/16/22 0400 02/16/22 0453 02/16/22 0735  ?BP: 123/84 116/83  122/82  ?Pulse: 95 89  89  ?Resp: 16 19  18   ?Temp: 98.1 ?F (36.7 ?C) 97.8 ?F (36.6 ?C)  97.7 ?F (36.5 ?C)  ?TempSrc: Oral Oral  Oral  ?SpO2: 96% 93%  96%  ?Weight:   108.2 kg   ?Height:      ? ?CBC:  ?Recent Labs  ?Lab 02/11/22 ?0654 02/11/22 ?2108 02/12/22 ?0421 02/13/22 ?[id number] 02/15/22 ?0155 02/16/22 ?0200  ?WBC 7.8  --  15.3*   < > 11.4* 9.7  ?NEUTROABS 2.8  --  10.4*  --   --   --   ?HGB 16.4   < > 15.3   < > 16.3 16.5  ?HCT 48.8   < > 45.1   < > 45.5 45.6  ?MCV 91.6  --  93.6   < > 89.9 89.6  ?PLT 181  --  170   < > 182 195  ? < > = values in this interval not displayed.  ? ? ?Basic Metabolic Panel:  ?Recent Labs  ?Lab 02/15/22 ?0155 02/16/22 ?0200  ?NA 134* 132*  ?K 4.0 3.8  ?CL 105 100  ?CO2 20* 21*  ?GLUCOSE 111* 176*  ?BUN 17 22*  ?CREATININE 0.93 1.06  ?CALCIUM 8.8* 8.7*  ? ? ?Lipid Panel:  ?Recent Labs  ?Lab 02/12/22 ?0421  ?CHOL 198  ?TRIG 146  ?HDL 47  ?CHOLHDL 4.2  ?VLDL 29  ?Leonard 122*  ? ? ?HgbA1c:  ?Recent Labs  ?Lab 02/11/22 ?0654  ?HGBA1C 6.0*  ? ? ?Urine Drug Screen:  ?Recent Labs  ?Lab 02/11/22 ?1608  ?LABOPIA NONE DETECTED  ?COCAINSCRNUR NONE DETECTED  ?LABBENZ NONE DETECTED  ?AMPHETMU NONE DETECTED  ?THCU NONE DETECTED  ?LABBARB NONE DETECTED  ? ?  ?Alcohol Level  ?Recent Labs  ?Lab 02/11/22 ?0654  ?ETH <10  ? ? ? ?IMAGING past 24 hours ?DG Abd 1 View ? ?Result Date: 02/15/2022 ?CLINICAL DATA:  Feeding tube placement. EXAM: ABDOMEN - 1 VIEW COMPARISON:  None. FINDINGS: The bowel gas pattern is normal. Distal tip of feeding tube is seen in expected position of distal stomach. No radio-opaque calculi or other significant radiographic abnormality are  seen. IMPRESSION: Distal tip feeding tube seen in expected position of distal stomach. No abnormal bowel dilatation is noted. Electronically Signed   By: Marijo Conception M.D.   On: 02/15/2022 11:56  ? ?ECHO TEE ? ?Result Date: 02/15/2022 ?   TRANSESOPHOGEAL ECHO REPORT   Patient Name:   Gordon Fisher Date of Exam: 02/15/2022 Medical Rec #:  [id number]       Height:       66.0 in Accession #:    [id number]      Weight:       241.2 lb Date of Birth:  02-18-1961       BSA:          2.165 m? Patient Age:    61 years        BP:           120/109 mmHg Patient Gender: M               HR:  102 bpm. Exam Location:  Inpatient Procedure: Transesophageal Echo, Color Doppler, Cardiac Doppler and Saline            Contrast Bubble Study Indications:     Stroke i63.9  History:         Patient has prior history of Echocardiogram examinations, most                  recent 02/12/2022.  Sonographer:     Raquel Sarna Senior RDCS Referring Phys:  [id number] Leanor Kail Diagnosing Phys: Rudean Haskell MD PROCEDURE: After discussion of the risks and benefits of a TEE, an informed consent was obtained from the patient. The transesophogeal probe was passed without difficulty through the esophogus of the patient. Sedation performed by different physician. The patient was monitored while under deep sedation. Anesthestetic sedation was provided intravenously by Anesthesiology: 192mg  of Propofol, 100mg  of Lidocaine. The patient developed no complications during the procedure. IMPRESSIONS  1. Left ventricular ejection fraction, by estimation, is 60 to 65%. The left ventricle has normal function.  2. Right ventricular systolic function is normal. The right ventricular size is normal.  3. No left atrial/left atrial appendage thrombus was detected. The LAA emptying velocity was 52 cm/s.  4. The mitral valve is normal in structure. No evidence of mitral valve regurgitation.  5. The aortic valve is tricuspid. Aortic valve regurgitation is not  visualized.  6. There is mild (Grade II) plaque involving the descending aorta.  7. Agitated saline contrast bubble study was negative, with no evidence of any interatrial shunt. FINDINGS  Left Ventricle: Left ventricular ejection fraction, by estimation, is 60 to 65%. The left ventricle has normal function. The left ventricular internal cavity size was normal in size. Right Ventricle: The right ventricular size is normal. No increase in right ventricular wall thickness. Right ventricular systolic function is normal. Left Atrium: Left atrial size was normal in size. No left atrial/left atrial appendage thrombus was detected. The LAA emptying velocity was 52 cm/s. Right Atrium: Right atrial size was normal in size. Pericardium: There is no evidence of pericardial effusion. Mitral Valve: The mitral valve is normal in structure. No evidence of mitral valve regurgitation. Tricuspid Valve: The tricuspid valve is normal in structure. Tricuspid valve regurgitation is not demonstrated. No evidence of tricuspid stenosis. Aortic Valve: The aortic valve is tricuspid. Aortic valve regurgitation is not visualized. Pulmonic Valve: The pulmonic valve was normal in structure. Pulmonic valve regurgitation is not visualized. No evidence of pulmonic stenosis. Aorta: The aortic root and ascending aorta are structurally normal, with no evidence of dilitation. There is mild (Grade II) plaque involving the descending aorta. IAS/Shunts: No atrial level shunt detected by color flow Doppler. Agitated saline contrast was given intravenously to evaluate for intracardiac shunting. Agitated saline contrast bubble study was negative, with no evidence of any interatrial shunt.  AORTIC VALVE LVOT Vmax:   52.30 cm/s LVOT Vmean:  38.900 cm/s LVOT VTI:    0.086 m  SHUNTS Systemic VTI: 0.09 m Rudean Haskell MD Electronically signed by Rudean Haskell MD Signature Date/Time: 02/15/2022/5:18:21 PM    Final    ? ?PHYSICAL EXAM ?General:   Well-developed, well-nourished middle-aged Caucasian male patient in no acute distress ?Respiratory:  Respirations regular and unlabored ? ?NEURO:  ?Mental Status: difficult to assess due to aphasia, unable to follow commands, able to mimic ?Speech/Language: Expressive and receptive aphasia present.  Patient is able to say "yes" but does not appear to understand what is being said ? ?Cranial Nerves:  ?  II: PERRL.  ?III, IV, VI: EOMI. Eyelids elevate symmetrically.  ?VII: Right sided facial droop present ?VIII: hearing intact to voice. ?IX, X: Phonation is normal.  ?Motor: Able to move LUE and LLE spontaneously. RUE moves to noxious stimuli, holds RLE against gravity with foot on the bed.  ?Gait- deferred ? ? ?ASSESSMENT/PLAN ?Gordon Fisher is a 61 y.o. male with no significant medical history presenting with slurred speech and right sided weakness.  He was brought to [facility] and was given TNK then transferred here for thrombectomy.  Mechnaical thrombectomy was performed in the left M1 MCA with TICI 3 flow achieved.  Unfortunately, later that day the left M1 MCA reoccluded.  Mechanical thrombectomy was performed again with recanalization achieved but dissection of the MCA occurred.  A stent was placed, but this unfortunately occluded and was unable to be recanalized.  Patient has remained intubated after the procedure and was extubated on 4/14.  He remains aphasic with right hemiplegia. TEE done 4/17, negative bubble study, no cardiac thrombus. ? 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 Mild HT in left parietal lobe ?S/p IR x 2 with stent  placement for left M2 dissection and then stent reocclusion ?Repeat CT head 4/14 evolving left MCA distribution infarct, small SAH, stenting of proximal left MCA branches ?Repeat MRI extensive evolving acute ischem

## 2022-02-17 LAB — GLUCOSE, CAPILLARY
Glucose-Capillary: 164 mg/dL — ABNORMAL HIGH (ref 70–99)
Glucose-Capillary: 167 mg/dL — ABNORMAL HIGH (ref 70–99)
Glucose-Capillary: 169 mg/dL — ABNORMAL HIGH (ref 70–99)
Glucose-Capillary: 181 mg/dL — ABNORMAL HIGH (ref 70–99)
Glucose-Capillary: 193 mg/dL — ABNORMAL HIGH (ref 70–99)
Glucose-Capillary: 195 mg/dL — ABNORMAL HIGH (ref 70–99)

## 2022-02-17 LAB — CBC
HCT: 45.2 % (ref 39.0–52.0)
Hemoglobin: 16.1 g/dL (ref 13.0–17.0)
MCH: 32.1 pg (ref 26.0–34.0)
MCHC: 35.6 g/dL (ref 30.0–36.0)
MCV: 90.2 fL (ref 80.0–100.0)
Platelets: 196 10*3/uL (ref 150–400)
RBC: 5.01 MIL/uL (ref 4.22–5.81)
RDW: 12.5 % (ref 11.5–15.5)
WBC: 10.6 10*3/uL — ABNORMAL HIGH (ref 4.0–10.5)
nRBC: 0 % (ref 0.0–0.2)

## 2022-02-17 LAB — BASIC METABOLIC PANEL
Anion gap: 7 (ref 5–15)
BUN: 23 mg/dL — ABNORMAL HIGH (ref 6–20)
CO2: 23 mmol/L (ref 22–32)
Calcium: 8.7 mg/dL — ABNORMAL LOW (ref 8.9–10.3)
Chloride: 103 mmol/L (ref 98–111)
Creatinine, Ser: 0.99 mg/dL (ref 0.61–1.24)
GFR, Estimated: >60 mL/min (ref >60)
Glucose, Bld: 190 mg/dL — ABNORMAL HIGH (ref 70–99)
Potassium: 3.9 mmol/L (ref 3.5–5.1)
Sodium: 133 mmol/L — ABNORMAL LOW (ref 135–145)

## 2022-02-17 NOTE — Progress Notes (Addendum)
STROKE TEAM PROGRESS NOTE  ? ?INTERVAL HISTORY ?Patient is seen in hs room with his wife at the bedside.  She has many questions about his condition and treatment plan.  He has been hemodynamically stable, has had no acute events overnight and his neurological exam is unchanged. ? ?Vitals:  ? 02/17/22 0338 02/17/22 0345 02/17/22 0735 02/17/22 1142  ?BP: 136/83  135/73 133/81  ?Pulse: 88  87 91  ?Resp: 18  14 20   ?Temp: 98.3 ?F (36.8 ?C)  98.1 ?F (36.7 ?C) 99.6 ?F (37.6 ?C)  ?TempSrc: Oral  Oral Oral  ?SpO2: 94%  93% 95%  ?Weight:  106.1 kg    ?Height:      ? ?CBC:  ?Recent Labs  ?Lab 02/11/22 ?0654 02/11/22 ?2108 02/12/22 ?0421 02/13/22 ?0233 02/16/22 ?0200 02/17/22 ?0300  ?WBC 7.8  --  15.3*   < > 9.7 10.6*  ?NEUTROABS 2.8  --  10.4*  --   --   --   ?HGB 16.4   < > 15.3   < > 16.5 16.1  ?HCT 48.8   < > 45.1   < > 45.6 45.2  ?MCV 91.6  --  93.6   < > 89.6 90.2  ?PLT 181  --  170   < > 195 196  ? < > = values in this interval not displayed.  ? ? ?Basic Metabolic Panel:  ?Recent Labs  ?Lab 02/16/22 ?0200 02/17/22 ?0300  ?NA 132* 133*  ?K 3.8 3.9  ?CL 100 103  ?CO2 21* 23  ?GLUCOSE 176* 190*  ?BUN 22* 23*  ?CREATININE 1.06 0.99  ?CALCIUM 8.7* 8.7*  ? ? ?Lipid Panel:  ?Recent Labs  ?Lab 02/12/22 ?0421  ?CHOL 198  ?TRIG 146  ?HDL 47  ?CHOLHDL 4.2  ?VLDL 29  ?LDLCALC 122*  ? ? ?HgbA1c:  ?Recent Labs  ?Lab 02/11/22 ?0654  ?HGBA1C 6.0*  ? ? ?Urine Drug Screen:  ?Recent Labs  ?Lab 02/11/22 ?1608  ?LABOPIA NONE DETECTED  ?COCAINSCRNUR NONE DETECTED  ?LABBENZ NONE DETECTED  ?AMPHETMU NONE DETECTED  ?THCU NONE DETECTED  ?LABBARB NONE DETECTED  ? ?  ?Alcohol Level  ?Recent Labs  ?Lab 02/11/22 ?0654  ?ETH <10  ? ? ? ?IMAGING past 24 hours ?No results found. ? ?PHYSICAL EXAM ?General:  Well-developed, well-nourished middle-aged Caucasian male patient in no acute distress ?Respiratory:  Respirations regular and unlabored ? ?NEURO:  ?Mental Status: difficult to assess due to aphasia, unable to follow commands, able to  mimic ?Speech/Language: Expressive and receptive aphasia present.  Patient is able to say "yes" but does not appear to understand what is being said ? ?Cranial Nerves:  ?II: PERRL.  ?III, IV, VI: EOMI. Eyelids elevate symmetrically.  ?VII: Right sided facial droop present ?VIII: hearing intact to voice. ?IX, X: Phonation is normal.  ?Motor: Able to move LUE and LLE spontaneously, not moving RUE and RLE ?Gait- deferred ? ? ?ASSESSMENT/PLAN ?Mr. Gordon Fisher is a 61 y.o. male with no significant medical history presenting with slurred speech and right sided weakness.  He was brought to [facility] and was given TNK then transferred here for thrombectomy.  Mechnaical thrombectomy was performed in the left M1 MCA with TICI 3 flow achieved.  Unfortunately, later that day the left M1 MCA reoccluded.  Mechanical thrombectomy was performed again with recanalization achieved but dissection of the MCA occurred.  A stent was placed, but this unfortunately occluded and was unable to be recanalized.  Patient has remained intubated  after the procedure and was extubated on 4/14.  He remains aphasic with right hemiplegia. TEE done 4/17, negative bubble study, no cardiac thrombus. ? 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 Mild HT in left parietal lobe ?S/p IR x 2 with stent placement for left M2 dissection and then stent reocclusion ?Repeat CT head 4/14 evolving left MCA distribution infarct, small SAH, stenting of proximal left MCA branches ?Repeat MRI extensive evolving acute ischemic left MCA territory infarct, small volume SAH at posterior aspect of Sylvian  fissure, few small volume acute ischemic cortical and subcortical infarcts in right frontal lobe and ACA distribution ?2D Echo EF 60-65%, interatrial septum not well visualized ?TEE 4/17- negative bubble study, no cardiac thrombus ?Discussed with wife, she prefers Zio patch on discharge to rule out afib ?hypercoagulable work up neg except slightly elevated homocysteine  ?LDL 122 ?HgbA1c 6.0 ?VTE prophylaxis - SCDs ?No antithrombotic prior to admission, now on aspirin 81 mg daily and Brilinta (ticagrelor) 90 mg bid.  ?Therapy recommendations:  CIR ?Disposition:  pending ? ?Hypertension ?Home meds:  none ?Stable ?SBP <160 now ?Off cleviprex ?Long-term BP goal normotensive ? ?Hyperlipidemia ?Home meds:  none ?LDL 122, goal < 70 ?Add atorvastatin 40 mg  ?Continue statin at discharge ? ? ?Other Stroke Risk Factors ?Obesity, Body mass index is 37.75 kg/m?., BMI >/= 30 associated with increased stroke risk, recommend weight loss, diet and exercise as appropriate  ? ?Other Active Problems ?none ? ?Hospital day # 6 ? ?Patient seen and examined by NP/APP with MD. MD to update note as needed.  ? ?Cortney E Ernestina Columbia , MSN, AGACNP-BC ?[facility] ?See Amion for schedule and pager information ?02/17/2022 3:51 PM ? ?ATTENDING NOTE: ?I reviewed above note and agree with the assessment and plan. Pt was seen and examined.  ? ?No acute event overnight, neuro stable, still has global aphasia and right hemiparesis. Pending CIR. Continue DAPT and statin, will do Zio patch on discharge.  ? ?For detailed assessment and plan, please refer to above as I have made changes wherever appropriate.  ? ?Marvel Plan, MD PhD ?Stroke Neurology ?02/17/2022 ?10:15 PM ? ? ? ? ?To contact Stroke Continuity provider, please refer to [email or web address]. ?After hours, contact General Neurology  ?

## 2022-02-17 NOTE — Progress Notes (Addendum)
Physical Therapy Treatment ?Patient Details ?Name: Gordon Fisher ?MRN: [id number] ?DOB: 1961-04-16 ?Today's Date: 02/17/2022 ? ? ?History of Present Illness 61 y.o. male presents to [facility] hospital on 02/11/2022 with R weakness, facial droop and slurred speech. [place] dmonstrates L MCA. Pt given tNK and underwent thrombectomy on 4/13 with revascularization. Pt with recurrence of symptoms, returned to IR with stent placement x2 however stent became occluded. Pt remained intubated after procedure. No significant PMH noted. ? ?  ?PT Comments  ? ? Pt seen with OT today to progress OOB mobility. Pt with improved ability to tend to R side today to command and demo'd attempt to use R UE and LE. 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 Pt was able tolerate and progress to OOB to chair today. Pt remains appropriate for AIR upon d/c. Acute PT to cont to follow. ?  ?Recommendations for follow up therapy are one component of a multi-disciplinary discharge planning process, led by the attending physician.  Recommendations may be updated based on patient status, additional functional criteria and insurance authorization. ? ?Follow Up Recommendations ? Acute inpatient rehab (3hours/day) ?  ?  ?Assistance Recommended at Discharge Frequent or constant Supervision/Assistance  ?Patient can return home with the following Two people to help with walking and/or transfers;Two people to help with bathing/dressing/bathroom;Assistance with cooking/housework;Assistance with feeding;Direct supervision/assist for medications management;Assist for transportation;Direct supervision/assist for financial management;Help with stairs or ramp for entrance ?  ?Equipment Recommendations ? Wheelchair (measurements PT);Wheelchair cushion (measurements PT);Hospital bed (hoyer lift)  ?  ?Recommendations for Other Services  Rehab consult ? ? ?  ?Precautions / Restrictions Precautions ?Precautions: Fall ?Precaution Comments: R neglect and hemiplegia ?Restrictions ?Weight Bearing Restrictions: No  ?  ? ?Mobility ? Bed Mobility ?Overal bed mobility: Needs Assistance ?Bed Mobility: Supine to Sit, Sit to Supine ?  ?  ?Supine to sit: Max assist ?  ?  ?General bed mobility comments: maxA to roll to the R with max multimodal cues. pt requires tactile cues to initiate movement, maxA for trunk elevation and management of R LE off EOB ?  ? ?Transfers ?Overall transfer level: Needs assistance ?Equipment used: 2 person hand held assist, Ambulation equipment used (3 musketeer transfer with R knee blocked) ?Transfers: Sit to/from Stand, Bed to chair/wheelchair/BSC ?Sit to Stand: Max assist, +2 physical assistance ?  ?  ?  ?  ?  ?General transfer comment: R knee block, with cross over support at posterior hips to promote trunk extension and achieve full upright posture, pt then completed 4 sit to stands in stedy with maxA to block R knee and maintain extension and 2 people bring hips up into extension ?Transfer via Lift Equipment: Stedy ? ?Ambulation/Gait ?  ?  ?  ?  ?  ?  ?  ?  ? ? ?Stairs ?  ?  ?  ?  ?  ? ? ?Wheelchair Mobility ?  ? ?Modified Rankin (Stroke Patients Only) ?Modified Rankin (Stroke Patients Only) ?Pre-Morbid Rankin Score: No symptoms ?Modified Rankin: Severe disability ? ? ?  ?Balance Overall balance assessment: Needs assistance ?Sitting-balance support: Single extremity supported, Feet supported ?Sitting balance-Leahy Scale: Poor ?Sitting balance - Comments: pt much improved yesterday, pt able to maintain midline posture with close min guard assist, pt able to self correct when leaning to the L without verbal cues ?Postural control: Left lateral lean ?Standing balance support: Bilateral upper extremity supported ?  Standing balance-Leahy Scale: Zero ?Standing balance comment: maxA x 2, R knee block ?  ?  ?  ?  ?  ?  ?  ?  ?  ?  ?  ?   ? ?  ?Cognition Arousal/Alertness: Awake/alert ?Behavior During Therapy: Flat affect ?Overall Cognitive Status: Impaired/Different from baseline ?Area of Impairment: Following commands, Safety/judgement, Awareness, Problem solving ?  ?  ?  ?  ?  ?  ?  ?  ?  ?  ?  ?Following Commands: Follows one step commands inconsistently ?Safety/Judgement: Decreased awareness of safety, Decreased awareness of deficits (R neglect, apraxia, perseveration) ?  ?Problem Solving: Slow processing, Decreased initiation, Difficulty sequencing, Requires tactile cues, Requires verbal cues ?General Comments: pt follows 1 step commands 50% of the time, only with L UE. Pt states "yes" to all questions asked, unable to verbal state other words. Pt with noted apraxia, impaired motor planning, sequencing, and comprehension. ?  ?  ? ?  ?Exercises Other Exercises ?Other Exercises: worked on standing tolerance. tactile cues given to R quad in attempt to stimulate quad contraction to achieve knee extension ? ?  ?General Comments General comments (skin integrity, edema, etc.): VSS, pt with large liquid BM, dependent for hygiene, pt unable to communicate he was having BM ?  ?  ? ?Pertinent Vitals/Pain Pain Assessment ?Pain Assessment: Faces ?Faces Pain Scale: No hurt  ? ? ?Home Living   ?  ?  ?  ?  ?  ?  ?  ?  ?  ?   ?  ?Prior Function    ?  ?  ?   ? ?PT Goals (current goals can now be found in the care plan section) Acute Rehab PT Goals ?Patient Stated Goal: family states goal of returning to independence ?PT Goal Formulation: With family ?Time For Goal Achievement: 02/26/22 ?Potential to Achieve Goals: Fair ?Progress towards PT goals: Progressing toward goals ? ?  ?Frequency ? ? ? Min 4X/week ? ? ? ?  ?PT Plan Current plan remains appropriate  ? ? ?Co-evaluation PT/OT/SLP Co-Evaluation/Treatment: Yes ?Reason for Co-Treatment: To address functional/ADL transfers ?PT goals addressed during session: Mobility/safety with mobility ?  ?  ? ?  ?AM-PAC PT  "6 Clicks" Mobility   ?Outcome Measure ? Help needed turning from your back to your side while in a flat bed without using bedrails?: A Lot ?Help needed moving from lying on your back to sitting on the side of a flat bed without using bedrails?: A Lot ?Help needed moving to and from a bed to a chair (including a wheelchair)?: A Lot ?Help needed standing up from a chair using your arms (e.g., wheelchair or bedside chair)?: A Lot ?Help needed to walk in hospital room?: Total ?Help needed climbing 3-5 steps with a railing? : Total ?6 Click Score: 10 ? ?  ?End of Session Equipment Utilized During Treatment: Gait belt ?Activity Tolerance: Patient tolerated treatment well ?Patient left: in bed;with call bell/phone within reach;with bed alarm set;with family/visitor present ?Nurse Communication: Mobility status;Need for lift equipment ?PT Visit Diagnosis: Other abnormalities of gait and mobility (R26.89);Other symptoms and signs involving the nervous system (R29.898);Hemiplegia and hemiparesis ?Hemiplegia - Right/Left: Right ?Hemiplegia - dominant/non-dominant: Dominant ?Hemiplegia - caused by: Cerebral infarction ?  ? ? ?Time: [id number] ?PT Time Calculation (min) (ACUTE ONLY): 45 min ? ?Charges:  $Therapeutic Activity: 8-22 mins ?$Neuromuscular Re-education: 8-22 mins          ?          ? ?  Kittie Plater, PT, DPT ?Acute Rehabilitation Services ?Secure chat preferred ?Office #: [phone] ? ? ? ?Nohelani Benning M Lilybeth Vien ?02/17/2022, 1:33 PM ? ?

## 2022-02-17 NOTE — Progress Notes (Signed)
Occupational Therapy Treatment ?Patient Details ?Name: Gordon Fisher ?MRN: [id number] ?DOB: 1961/01/17 ?Today's Date: 02/17/2022 ? ? ?History of present illness 61 y.o. male presents to [facility] hospital on 02/11/2022 with R weakness, facial droop and slurred speech. [place] dmonstrates L MCA. Pt given tNK and underwent thrombectomy on 4/13 with revascularization. Pt with recurrence of symptoms, returned to IR with stent placement x2 however stent became occluded. Pt remained intubated after procedure; extubated 4/14. No significant PMH noted. ?  ?OT comments ? Pt making excellent progress. Able to maintain balance while EOB with improved postural control and midline orientation. Minimal spontaneous movement RU/LE noted, however not moving on command. Able to progress to EOB with max A and stand with max A +2. When using Stedy, able to stand from paddles with min A. Incontinent of BM in [place], requiring total A to clean. Apparent apraxia and perseveration noted, however increased attention to right side and ability to initiate and complete functional movement patterns with use of repetition and hand over hand techniques. Excellent candidate for AIR. Very motivated pt and supportive wife. Acute OT to continue to follow.   ? ?Recommendations for follow up therapy are one component of a multi-disciplinary discharge planning process, led by the attending physician.  Recommendations may be updated based on patient status, additional functional criteria and insurance authorization. ?   ?Follow Up Recommendations ? Acute inpatient rehab (3hours/day)  ?  ?Assistance Recommended at Discharge    ?Patient can return home with the following ? Two people to help with walking and/or transfers;Assistance with feeding;Direct supervision/assist for medications management;Direct supervision/assist for financial management;Assist for transportation;Help with stairs or ramp for entrance;Assistance with cooking/housework;A lot of help with  bathing/dressing/bathroom ?  ?Equipment Recommendations ?    ?  ?Recommendations for Other Services Rehab consult ? ?  ?Precautions / Restrictions Precautions ?Precautions: Fall ?Precaution Comments: R inattention and hemiplegia ?Restrictions ?Weight Bearing Restrictions: No  ? ? ?  ? ?Mobility Bed Mobility ?Overal bed mobility: Needs Assistance ?Bed Mobility: Supine to Sit, Sit to Supine ?  ?  ?Supine to sit: Max assist ?  ?  ?General bed mobility comments: maxA to roll to the R with max multimodal cues. pt requires tactile cues to initiate movement, maxA for trunk elevation and management of R LE off EOB ?  ? ?Transfers ?Overall transfer level: Needs assistance ?Equipment used: 2 person hand held assist, Ambulation equipment used (3 musketeer transfer with R knee blocked) ?Transfers: Sit to/from Stand, Bed to chair/wheelchair/BSC ?Sit to Stand: Max assist, +2 physical assistance (mod A from elevated surface with pt pulling to stand using "Stedy") ?  ?  ?  ?  ?  ?General transfer comment: R knee block, with cross over support at posterior hips to promote trunk extension and achieve full upright posture, pt then completed 4 sit to stands in stedy with mod A  ?Transfer via Lift Equipment: Stedy ?  ?Balance Overall balance assessment: Needs assistance ?Sitting-balance support: Single extremity supported, Feet supported ?Sitting balance-Leahy Scale: Poor ?Sitting balance - Comments: pt able to maintain midline posture with close min guard assist, pt able to self correct when leaning to the L without verbal cues ?Postural control: Left lateral lean ?Standing balance support: Bilateral upper extremity supported ?Standing balance-Leahy Scale: Zero ?Standing balance comment: maxA x 2, R knee block ?  ?  ?  ?  ?  ?  ?  ?  ?  ?  ?  ?   ? ?ADL either performed or  assessed with clinical judgement  ? ?ADL Overall ADL's : Needs assistance/impaired ?Eating/Feeding: NPO ?Eating/Feeding Details (indicate cue type and reason):  coretrack ?Grooming: Moderate assistance ?  ?Upper Body Bathing: Moderate assistance ?  ?Lower Body Bathing: Maximal assistance ?  ?Upper Body Dressing : Maximal assistance ?  ?Lower Body Dressing: Maximal assistance ?Lower Body Dressing Details (indicate cue type and reason): once sock placed on foot, pt cued, then able to pull sock up and over heel ?  ?  ?Toileting- Clothing Manipulation and Hygiene: Total assistance ?Toileting - Clothing Manipulation Details (indicate cue type and reason): incontinent of BM ?  ?  ?Functional mobility during ADLs: +2 for physical assistance;Moderate assistance ?  ?  ? ?Extremity/Trunk Assessment Upper Extremity Assessment ?Upper Extremity Assessment: RUE deficits/detail ?RUE Deficits / Details: small active movements spontaneously however not moving on command; appears to demonstrate begninning of flexor sunergy ?RUE Coordination: decreased fine motor;decreased gross motor (nonfunctional) ?  ?Lower Extremity Assessment ?Lower Extremity Assessment: Defer to PT evaluation ?  ?  ?  ? ?Vision   ?Vision Assessment?: Yes ?Additional Comments: able to visually attend to R side however appears imparied; will continue to assess ?  ?Perception Perception ?Perception: Impaired ?  ?Praxis Praxis ?Praxis: Impaired ?Praxis Impairment Details: Perseveration;Motor planning;Initiation (does well with hand over hand to initiate correct movement patterns) ?  ? ?Cognition Arousal/Alertness: Awake/alert ?Behavior During Therapy: Flat affect ?Overall Cognitive Status: Impaired/Different from baseline ?Area of Impairment: Following commands, Safety/judgement, Awareness, Problem solving, Attention ?  ?  ?  ?  ?  ?  ?  ?  ?  ?Current Attention Level: Sustained ?  ?Following Commands: Follows one step commands inconsistently ?Safety/Judgement: Decreased awareness of safety, Decreased awareness of deficits (R neglect, apraxia, perseveration) ?  ?Problem Solving: Slow processing, Decreased initiation,  Difficulty sequencing, Requires tactile cues, Requires verbal cues ?General Comments: answering "yes" to all quesionts, however changes facial expression and intonation appropriately ?  ?  ?   ?Exercises Exercises: Other exercises ?Other Exercises ?Other Exercises: worked on standing tolerance. tactile cues given to R quad in attempt to stimulate quad contraction to achieve knee extension ?Other Exercises: RUE R/AAROM ? ?  ?Shoulder Instructions   ? ? ?  ?General Comments VSS on RA  ? ? ?Pertinent Vitals/ Pain       Pain Assessment ?Pain Assessment: Faces ?Faces Pain Scale: No hurt ? ?Home Living   ?  ?  ?  ?  ?  ?  ?  ?  ?  ?  ?  ?  ?  ?  ?  ?  ?  ?  ? ?  ?Prior Functioning/Environment    ?  ?  ?  ?   ? ?Frequency ? Min 2X/week  ? ? ? ? ?  ?Progress Toward Goals ? ?OT Goals(current goals can now be found in the care plan section) ? Progress towards OT goals: Progressing toward goals ? ?Acute Rehab OT Goals ?Patient Stated Goal: per wife for pt to get to rehab ?OT Goal Formulation: With patient/family ?Time For Goal Achievement: 02/26/22 ?Potential to Achieve Goals: Good ?ADL Goals ?Pt Will Perform Grooming: with mod assist;sitting ?Pt Will Transfer to Toilet: with mod assist;with +2 assist;stand pivot transfer;bedside commode ?Additional ADL Goal #1: Pt will demonstrate fair sitting balance at EOB for 10 minutes in preparation for ADLs ?Additional ADL Goal #2: Pt will follow 25% of one step commands with Mod verbal cues ?Additional ADL Goal #3: Pt will locate 3/5 grooming items  at midline with Min cues  ?Plan Discharge plan remains appropriate   ? ?Co-evaluation ? ? ? PT/OT/SLP Co-Evaluation/Treatment: Yes ?Reason for Co-Treatment: For patient/therapist safety;To address functional/ADL transfers;Complexity of the patient's impairments (multi-system involvement) ?PT goals addressed during session: Mobility/safety with mobility ?OT goals addressed during session: ADL's and self-care ?  ? ?  ?AM-PAC OT "6 Clicks"  Daily Activity     ?Outcome Measure ? ?   ?  ?  ?  ?  ?  ?  ? ?  ?End of Session Equipment Utilized During Treatment: Gait belt ? ?OT Visit Diagnosis: Unsteadiness on feet (R26.81);Other abnormalities of gait and mobility (

## 2022-02-17 NOTE — Progress Notes (Signed)
Inpatient Rehab Admissions Coordinator:  ? ?Met with patient and his spouse to review CIR recommendations and goals/expectations of CIR stay.  We reviewed 3 hrs/day of therapy, physician follow up, average length of stay ~2 weeks (though I feel he could require as many as 4), and we discussed goals of min assist, likely w/c level.  Her goal is to ultimately get patient home where she will be primary caregiver, aided by her large support system of family and church members/friends.  We discussed need for insurance approval and I will start that process today, awaiting updated OT note to send clinicals.  Will follow.  ? ?Shann Medal, PT, DPT ?Admissions Coordinator ?[phone] ?02/17/22  ?12:56 PM ? ?

## 2022-02-17 NOTE — Plan of Care (Signed)
?  Problem: Education: ?Goal: Knowledge of disease or condition will improve ?Outcome: Progressing ?Goal: Knowledge of secondary prevention will improve (SELECT ALL) ?Outcome: Progressing ?Goal: Knowledge of patient specific risk factors will improve (INDIVIDUALIZE FOR PATIENT) ?Outcome: Progressing ?  ?

## 2022-02-18 ENCOUNTER — Inpatient Hospital Stay (HOSPITAL_COMMUNITY)
Admission: RE | Admit: 2022-02-18 | Discharge: 2022-03-19 | DRG: 057 | Disposition: A | Discharge status: Home / Self Care | Payer: 59 | Source: Intra-hospital | Attending: Physical Medicine & Rehabilitation | Admitting: Physical Medicine & Rehabilitation

## 2022-02-18 ENCOUNTER — Encounter (HOSPITAL_COMMUNITY): Payer: Self-pay | Admitting: Physical Medicine & Rehabilitation

## 2022-02-18 ENCOUNTER — Inpatient Hospital Stay (HOSPITAL_BASED_OUTPATIENT_CLINIC_OR_DEPARTMENT_OTHER)
Admit: 2022-02-18 | Discharge: 2022-02-18 | Disposition: A | Discharge status: Home / Self Care | Payer: 59 | Attending: Student | Admitting: Student

## 2022-02-18 ENCOUNTER — Other Ambulatory Visit: Payer: Self-pay

## 2022-02-18 ENCOUNTER — Encounter (HOSPITAL_COMMUNITY): Payer: Self-pay | Admitting: Neurology

## 2022-02-18 DIAGNOSIS — E669 Obesity, unspecified: Secondary | ICD-10-CM

## 2022-02-18 DIAGNOSIS — I493 Ventricular premature depolarization: Secondary | ICD-10-CM

## 2022-02-18 DIAGNOSIS — I639 Cerebral infarction, unspecified: Secondary | ICD-10-CM

## 2022-02-18 DIAGNOSIS — I69390 Apraxia following cerebral infarction: Secondary | ICD-10-CM

## 2022-02-18 DIAGNOSIS — I82461 Acute embolism and thrombosis of right calf muscular vein: Secondary | ICD-10-CM | POA: Diagnosis not present

## 2022-02-18 DIAGNOSIS — I824Z9 Acute embolism and thrombosis of unspecified deep veins of unspecified distal lower extremity: Secondary | ICD-10-CM

## 2022-02-18 DIAGNOSIS — I69392 Facial weakness following cerebral infarction: Secondary | ICD-10-CM

## 2022-02-18 DIAGNOSIS — E7800 Pure hypercholesterolemia, unspecified: Secondary | ICD-10-CM

## 2022-02-18 DIAGNOSIS — R7303 Prediabetes: Secondary | ICD-10-CM | POA: Diagnosis present

## 2022-02-18 DIAGNOSIS — I63512 Cerebral infarction due to unspecified occlusion or stenosis of left middle cerebral artery: Secondary | ICD-10-CM

## 2022-02-18 DIAGNOSIS — N179 Acute kidney failure, unspecified: Secondary | ICD-10-CM | POA: Diagnosis present

## 2022-02-18 DIAGNOSIS — I69328 Other speech and language deficits following cerebral infarction: Secondary | ICD-10-CM

## 2022-02-18 DIAGNOSIS — N390 Urinary tract infection, site not specified: Secondary | ICD-10-CM | POA: Diagnosis not present

## 2022-02-18 DIAGNOSIS — I69351 Hemiplegia and hemiparesis following cerebral infarction affecting right dominant side: Principal | ICD-10-CM

## 2022-02-18 DIAGNOSIS — B9689 Other specified bacterial agents as the cause of diseases classified elsewhere: Secondary | ICD-10-CM | POA: Diagnosis not present

## 2022-02-18 DIAGNOSIS — Z6835 Body mass index (BMI) 35.0-35.9, adult: Secondary | ICD-10-CM

## 2022-02-18 DIAGNOSIS — R1311 Dysphagia, oral phase: Secondary | ICD-10-CM | POA: Diagnosis present

## 2022-02-18 DIAGNOSIS — I82401 Acute embolism and thrombosis of unspecified deep veins of right lower extremity: Secondary | ICD-10-CM | POA: Diagnosis not present

## 2022-02-18 DIAGNOSIS — E871 Hypo-osmolality and hyponatremia: Secondary | ICD-10-CM | POA: Diagnosis not present

## 2022-02-18 DIAGNOSIS — I824Y9 Acute embolism and thrombosis of unspecified deep veins of unspecified proximal lower extremity: Secondary | ICD-10-CM | POA: Diagnosis not present

## 2022-02-18 DIAGNOSIS — I69320 Aphasia following cerebral infarction: Secondary | ICD-10-CM | POA: Diagnosis not present

## 2022-02-18 DIAGNOSIS — R21 Rash and other nonspecific skin eruption: Secondary | ICD-10-CM | POA: Diagnosis present

## 2022-02-18 DIAGNOSIS — I82451 Acute embolism and thrombosis of right peroneal vein: Secondary | ICD-10-CM | POA: Diagnosis not present

## 2022-02-18 DIAGNOSIS — I69391 Dysphagia following cerebral infarction: Secondary | ICD-10-CM | POA: Diagnosis not present

## 2022-02-18 DIAGNOSIS — R1312 Dysphagia, oropharyngeal phase: Secondary | ICD-10-CM | POA: Diagnosis not present

## 2022-02-18 LAB — GLUCOSE, CAPILLARY
Glucose-Capillary: 201 mg/dL — ABNORMAL HIGH (ref 70–99)
Glucose-Capillary: 158 mg/dL — ABNORMAL HIGH (ref 70–99)
Glucose-Capillary: 210 mg/dL — ABNORMAL HIGH (ref 70–99)
Glucose-Capillary: 151 mg/dL — ABNORMAL HIGH (ref 70–99)
Glucose-Capillary: 114 mg/dL — ABNORMAL HIGH (ref 70–99)

## 2022-02-18 MED ORDER — OSMOLITE 1.5 CAL PO LIQD
600.0000 mL | ORAL | Status: DC
  Filled 2022-02-18: qty 711

## 2022-02-18 MED ORDER — FLEET ENEMA 7-19 GM/118ML RE ENEM
1.0000 | ENEMA | Freq: Once | RECTAL | Status: DC | PRN
Start: 2022-02-18 — End: 2022-03-19

## 2022-02-18 MED ORDER — INSULIN ASPART 100 UNIT/ML IJ SOLN
0.0000 [IU] | Freq: Every day | INTRAMUSCULAR | Status: DC
  Administered 2022-02-20 – 2022-02-21 (×2): 2 [IU] via SUBCUTANEOUS
  Administered 2022-03-01: 1 [IU] via SUBCUTANEOUS

## 2022-02-18 MED ORDER — POLYETHYLENE GLYCOL 3350 17 G PO PACK: 17.0000 g | PACK | Freq: Every day | ORAL | Status: DC | PRN

## 2022-02-18 MED ORDER — PROCHLORPERAZINE MALEATE 5 MG PO TABS: 5.0000 mg | ORAL_TABLET | Freq: Four times a day (QID) | ORAL | Status: DC | PRN

## 2022-02-18 MED ORDER — PROCHLORPERAZINE 25 MG RE SUPP: 12.5000 mg | Freq: Four times a day (QID) | RECTAL | Status: DC | PRN

## 2022-02-18 MED ORDER — ACETAMINOPHEN 325 MG PO TABS
325.0000 mg | ORAL_TABLET | ORAL | Status: DC | PRN
  Administered 2022-02-24 – 2022-03-05 (×4): 650 mg via ORAL
  Filled 2022-02-18 (×5): qty 2

## 2022-02-18 MED ORDER — BISACODYL 10 MG RE SUPP: 10.0000 mg | Freq: Every day | RECTAL | Status: DC | PRN

## 2022-02-18 MED ORDER — ALUM & MAG HYDROXIDE-SIMETH 200-200-20 MG/5ML PO SUSP: 30.0000 mL | ORAL | Status: DC | PRN

## 2022-02-18 MED ORDER — TICAGRELOR 90 MG PO TABS: 90.0000 mg | ORAL_TABLET | Freq: Two times a day (BID) | ORAL | 1 refills | Status: DC

## 2022-02-18 MED ORDER — EXERCISE FOR HEART AND HEALTH BOOK
Freq: Once | Status: AC
  Filled 2022-02-18: qty 1

## 2022-02-18 MED ORDER — ENOXAPARIN SODIUM 40 MG/0.4ML IJ SOSY
40.0000 mg | PREFILLED_SYRINGE | INTRAMUSCULAR | Status: DC
  Administered 2022-02-18 – 2022-03-05 (×16): 40 mg via SUBCUTANEOUS
  Filled 2022-02-18 (×16): qty 0.4

## 2022-02-18 MED ORDER — TRAZODONE HCL 50 MG PO TABS
25.0000 mg | ORAL_TABLET | Freq: Every evening | ORAL | Status: DC | PRN
  Administered 2022-02-22 – 2022-03-06 (×2): 50 mg via ORAL
  Filled 2022-02-18 (×4): qty 1

## 2022-02-18 MED ORDER — PROSOURCE TF PO LIQD
45.0000 mL | Freq: Two times a day (BID) | ORAL | Status: DC
  Administered 2022-02-18: 45 mL
  Filled 2022-02-18 (×2): qty 45

## 2022-02-18 MED ORDER — PANTOPRAZOLE 2 MG/ML SUSPENSION
40.0000 mg | Freq: Every day | ORAL | Status: DC
  Filled 2022-02-18: qty 20

## 2022-02-18 MED ORDER — TICAGRELOR 90 MG PO TABS
90.0000 mg | ORAL_TABLET | Freq: Two times a day (BID) | ORAL | Status: DC
  Administered 2022-02-18 – 2022-02-19 (×2): 90 mg
  Filled 2022-02-18 (×2): qty 1

## 2022-02-18 MED ORDER — ATORVASTATIN CALCIUM 40 MG PO TABS: 40.0000 mg | ORAL_TABLET | Freq: Every evening | ORAL | 1 refills | Status: DC

## 2022-02-18 MED ORDER — ONDANSETRON HCL 4 MG/2ML IJ SOLN: 4.0000 mg | Freq: Four times a day (QID) | INTRAMUSCULAR | Status: DC | PRN

## 2022-02-18 MED ORDER — ATORVASTATIN CALCIUM 40 MG PO TABS
40.0000 mg | ORAL_TABLET | Freq: Every evening | ORAL | Status: DC
  Administered 2022-02-18: 40 mg
  Filled 2022-02-18: qty 1

## 2022-02-18 MED ORDER — CALCIUM CARBONATE ANTACID 1250 MG/5ML PO SUSP
500.0000 mg | Freq: Two times a day (BID) | ORAL | Status: DC
  Administered 2022-02-18 – 2022-03-19 (×58): 500 mg via ORAL
  Filled 2022-02-18 (×59): qty 5

## 2022-02-18 MED ORDER — POLYETHYLENE GLYCOL 3350 17 G PO PACK
17.0000 g | PACK | Freq: Every day | ORAL | Status: DC
  Administered 2022-02-19: 17 g
  Filled 2022-02-18: qty 1

## 2022-02-18 MED ORDER — ASPIRIN 81 MG PO CHEW
81.0000 mg | CHEWABLE_TABLET | Freq: Every day | ORAL | Status: DC
  Filled 2022-02-18: qty 1

## 2022-02-18 MED ORDER — DIPHENHYDRAMINE HCL 12.5 MG/5ML PO ELIX
12.5000 mg | ORAL_SOLUTION | Freq: Four times a day (QID) | ORAL | Status: DC | PRN
  Administered 2022-02-19 – 2022-02-20 (×2): 25 mg via ORAL
  Filled 2022-02-18 (×2): qty 10

## 2022-02-18 MED ORDER — CHLORHEXIDINE GLUCONATE 0.12 % MT SOLN
15.0000 mL | Freq: Two times a day (BID) | OROMUCOSAL | Status: DC
  Administered 2022-02-18 – 2022-03-19 (×54): 15 mL via OROMUCOSAL
  Filled 2022-02-18 (×54): qty 15

## 2022-02-18 MED ORDER — INSULIN ASPART 100 UNIT/ML IJ SOLN
0.0000 [IU] | Freq: Three times a day (TID) | INTRAMUSCULAR | Status: DC
  Administered 2022-02-18: 2 [IU] via SUBCUTANEOUS
  Administered 2022-02-19 (×2): 1 [IU] via SUBCUTANEOUS
  Administered 2022-02-19: 2 [IU] via SUBCUTANEOUS
  Administered 2022-02-20: 1 [IU] via SUBCUTANEOUS
  Administered 2022-02-20: 2 [IU] via SUBCUTANEOUS
  Administered 2022-02-21: 1 [IU] via SUBCUTANEOUS
  Administered 2022-02-21: 2 [IU] via SUBCUTANEOUS
  Administered 2022-02-22 (×2): 1 [IU] via SUBCUTANEOUS
  Administered 2022-02-22: 2 [IU] via SUBCUTANEOUS
  Administered 2022-02-23 – 2022-02-24 (×3): 1 [IU] via SUBCUTANEOUS
  Administered 2022-02-24 – 2022-02-26 (×3): 2 [IU] via SUBCUTANEOUS
  Administered 2022-02-26 – 2022-03-01 (×8): 1 [IU] via SUBCUTANEOUS

## 2022-02-18 MED ORDER — GUAIFENESIN-DM 100-10 MG/5ML PO SYRP: 5.0000 mL | ORAL_SOLUTION | Freq: Four times a day (QID) | ORAL | Status: DC | PRN

## 2022-02-18 MED ORDER — PROCHLORPERAZINE EDISYLATE 10 MG/2ML IJ SOLN: 5.0000 mg | Freq: Four times a day (QID) | INTRAMUSCULAR | Status: DC | PRN

## 2022-02-18 MED ORDER — ORAL CARE MOUTH RINSE
15.0000 mL | Freq: Two times a day (BID) | OROMUCOSAL | Status: DC
  Administered 2022-02-18 – 2022-03-15 (×38): 15 mL via OROMUCOSAL

## 2022-02-18 MED ORDER — ASPIRIN 81 MG PO CHEW
81.0000 mg | CHEWABLE_TABLET | Freq: Every day | ORAL | Status: DC
Start: 2022-02-19 — End: 2022-03-12
  Administered 2022-02-19 – 2022-03-12 (×22): 81 mg via ORAL
  Filled 2022-02-18 (×22): qty 1

## 2022-02-18 MED ORDER — ASPIRIN 81 MG PO CHEW
81.0000 mg | CHEWABLE_TABLET | Freq: Every day | ORAL | 1 refills | Status: DC
Start: 2022-02-19 — End: 2022-03-19

## 2022-02-18 NOTE — H&P (Shared)
? ? ?Physical Medicine and Rehabilitation Admission H&P ? ?  ?Chief Complaint  ?Patient presents with  ? Stroke with functional deficits.   ? ? ?HPI: Gordon Fisher is a 61 year old male in relatively good health who was admitted via [facility] on 02/11/22 with right sided weakness, right facial droop and slurred speech. CTH showed L-MCA hyperdense sign and he received TNK prior to transfer. CTA showed L-MCA MI occlusion and he underwent cerebral angio with thrombectomy and TICI 3 flow by Dr. Nelida Gores [name].  Follow up MRI brain done revealing small areas of acute infarct infarct in L-MCA territory with mild hemorrhagic transformation in left parietal lobe and L-MCA bifurcation residual vascular thrombus. He was taken back for thrombectomy with stent for dissection but had intra-stent occlusion which was unable to be recanalized. Post op CT showed small SAH which was stable on follow up. He was started on ASA/Brillinta. He was started on tube feeds for nutritional support and MBS 04/18 showed oral dysphagia with oral delay and no penetration or aspiration. He was started on D3, thins with supervision.  ? ?TEE showed EF 60-65%, was negative for thrombus, PFO and showed grade II plaque in descending aorta.  Repeat MRI brain on 04/16 showed extensive evolving acute ischemic L-MCA occlusion increased in size with associated edema, few additional small acute cortical subcortical infarcts involving contralateral right frontal lobe and left ACA distribution. Stroke felt to be due to unknown etiology and wife elected on Zio patch on discharge to rule out A fib. 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 CIR recommended due to functional decline.  ? ? ?Review of Systems  ?Unable to perform ROS: Language  ? ? ?History reviewed. No pertinent past medical history. ? ? ?Past Surgical History:  ?Procedure Laterality Date  ?  BUBBLE STUDY  02/15/2022  ? Procedure: BUBBLE STUDY;  Surgeon: Werner Lean, MD;  Location: [facility];  Service: Cardiovascular;;  ? IR CT HEAD LTD  02/11/2022  ? IR CT HEAD LTD  02/11/2022  ? IR CT HEAD LTD  02/11/2022  ? IR PERCUTANEOUS ART THROMBECTOMY/INFUSION INTRACRANIAL INC DIAG ANGIO  02/11/2022  ? IR PERCUTANEOUS ART THROMBECTOMY/INFUSION INTRACRANIAL INC DIAG ANGIO  02/11/2022  ? IR US GUIDE VASC ACCESS RIGHT  02/11/2022  ? IR US GUIDE VASC ACCESS RIGHT  02/11/2022  ? RADIOLOGY WITH ANESTHESIA N/A 02/11/2022  ? Procedure: IR WITH ANESTHESIA;  Surgeon: Radiologist, Medication, MD;  Location: [facility];  Service: Radiology;  Laterality: N/A;  ? RADIOLOGY WITH ANESTHESIA N/A 02/11/2022  ? Procedure: RADIOLOGY WITH ANESTHESIA;  Surgeon: Radiologist, Medication, MD;  Location: [place] [facility];  Service: Radiology;  Laterality: N/A;  ? TEE WITHOUT CARDIOVERSION N/A 02/15/2022  ? Procedure: TRANSESOPHAGEAL ECHOCARDIOGRAM (TEE);  Surgeon: Werner Lean, MD;  Location: [facility];  Service: Cardiovascular;  Laterality: N/A;  ? ? ?Family Hx: unable to elicit ? ? ?Social History:  has no history on file for tobacco use, alcohol use, and drug use. ? ? ?Allergies: Not on File ? ? ?Medications Prior to Admission  ?Medication Sig Dispense Refill  ? Calcium Carbonate Antacid (TUMS PO) Take 1-2 tablets by mouth daily as needed (acid reflux/indigestion).    ? ? ? ? ?Home: ?Home Living ?Family/patient expects to be discharged to:: Private residence ?Living Arrangements: Spouse/significant other ?Available Help at Discharge: Family, Available PRN/intermittently ?Type of Home: House ?Home Access: Stairs to enter ?Entrance Stairs-Number of Steps:  5+3 ?Entrance Stairs-Rails: Right, Left ?Home Layout: Two level, Able to live on main level with bedroom/bathroom ?Bathroom Shower/Tub: Walk-in shower ?Bathroom Toilet: Standard ?Home Equipment: None ? Lives With: Spouse ?  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 ?Functional Status:  ?Mobility: ?Bed Mobility ?Overal bed mobility: Needs Assistance ?Bed Mobility: Supine to Sit, Sit to Supine ?Supine to sit: Max assist ?Sit to supine: +2 for physical assistance, Max assist ?General bed mobility comments: assist with RLE and trunk, increased time, multimodal cues for sequencing/technique ?Transfers ?Overall transfer level: Needs assistance ?Equipment used: 2 person hand held assist, Ambulation equipment used (3 musketeer transfer with R knee blocked) ?Transfers: Sit to/from Stand ?Sit to Stand: +2 physical assistance, +2 safety/equipment, Max assist, Mod assist ?Bed to/from chair/wheelchair/BSC transfer type:: Via Lift equipment (used stedy) ?Transfer via Lift Equipment: Stedy ?General transfer comment: Sit to stand x 4 trials from EOB. +2 max initially progressing to +2 mod.  30 second static stand in stedy first 3 trials and 1 minute on 4th trial. ?  ?  ? ?ADL: ?ADL ?Overall ADL's : Needs assistance/impaired ?Eating/Feeding: NPO ?Eating/Feeding Details (indicate cue type and reason): coretrack ?Grooming: Moderate assistance ?Grooming Details (indicate cue type and reason): Max hand over hand for bringing wash clothe to face. ?Upper Body Bathing: Moderate assistance ?Lower Body Bathing: Maximal assistance ?Upper Body Dressing : Maximal assistance ?Lower Body Dressing: Maximal assistance ?Lower Body Dressing Details (indicate cue type and reason): once sock placed on foot, pt cued, then able to pull sock up and over heel ?Toileting- Clothing Manipulation and Hygiene: Total assistance ?Toileting - Clothing Manipulation Details (indicate cue type and reason): incontinent of BM ?Functional mobility during ADLs: +2 for physical assistance, Moderate assistance ?General ADL Comments: Max A-Total A for ADLs ? ?Cognition: ?Cognition ?Overall Cognitive Status:  Impaired/Different from baseline ?Arousal/Alertness: Awake/alert ?Orientation Level: Other (comment) (UTA 2/2 expressive and receptive aphasia) ?Attention: Focused ?Focused Attention: Impaired ?Focused Attention Impairment: Verbal basic, Functional basic ?Awareness: Impaired ?Behaviors: Perseveration, Impulsive ?Cognition ?Arousal/Alertness: Awake/alert ?Behavior During Therapy: Flat affect ?Overall Cognitive Status: Impaired/Different from baseline ?Area of Impairment: Following commands, Safety/judgement, Awareness, Problem solving, Attention ?Current Attention Level: Sustained ?Following Commands: Follows one step commands inconsistently ?Safety/Judgement: Decreased awareness of safety, Decreased awareness of deficits ?Awareness: Intellectual ?Problem Solving: Slow processing, Decreased initiation, Difficulty sequencing, Requires tactile cues, Requires verbal cues ?General Comments: Answers 'yeah' to all questions; however, facial expression changes appopriately. ?Difficult to assess due to: Impaired communication ? ? ?Blood pressure 107/80, pulse 90, temperature 99.2 ?F (37.3 ?C), temperature source Oral, resp. rate 18, height 5\' 6"  (1.676 m), weight 106.6 kg, SpO2 94 %. ?Physical Exam ?Vitals and nursing note reviewed.  ?Constitutional:   ?   Appearance: Normal appearance.  ?   Comments: Obese male with cortak in nares. NAD.   ?Abdominal:  ?   General: There is no distension.  ?   Comments: Small reducible umbilical hernia  ?Skin: ?   Comments: Left antecubital area with multiple dry healing blisters? Abrasions?   ?Neurological:  ?   Mental Status: He is alert.  ?   Comments: Mild right inattention but able to  move eyes to right field with cues. Right hemiplegia with emerging tone at elbow and knee. Mild right facial weakness and verbal output limited to "yeah" to all questions. He was unable to recognize his name, point to items or follow simple motor commands  with visual and tactile cues.   ? ? ?Results for  orders placed or performed during the hospital encounter of 02/11/22 (from the past 48 hour(s))  ?Glucose, capillary     Status: Abnormal  ? Collection Time: 02/16/22 12:41 PM  ?Result Value Ref Range  ? Glucos

## 2022-02-18 NOTE — Progress Notes (Signed)
Physical Therapy Treatment ?Patient Details ?Name: Gordon Fisher ?MRN: [id number] ?DOB: Oct 08, 1961 ?Today's Date: 02/18/2022 ? ? ?History of Present Illness 61 y.o. male presents to [facility] hospital on 02/11/2022 with R weakness, facial droop and slurred speech. [place] dmonstrates L MCA. Pt given tNK and underwent thrombectomy on 4/13 with revascularization. Pt with recurrence of symptoms, returned to IR with stent placement x2 however stent became occluded. Pt remained intubated after procedure; extubated 4/14. No significant PMH noted. ? ?  ?PT Comments  ? ? Pt received in bed.Session focused on standing trials in stedy. Pt required +1-2 max assist bed mobility, +2 mod/max assist sit to stand in stedy. Static standing trials in stedy x 4. 30 seconds/trial for first 3, then 1 minute for 4th trial. Mod assist to maintain standing balance, R lateral lean and stedy knee pad blocking R knee. Min assist to maintain sitting balance EOB. R inattention. Expressive difficulties, answering 'yeah' to all questions but appropriate changes in facial expressions noted. Following simple commands approx 50% of time. Pt returned to supine in bed at end of session.  ?  ?Recommendations for follow up therapy are one component of a multi-disciplinary discharge planning process, led by the attending physician.  Recommendations may be updated based on patient status, additional functional criteria and insurance authorization. ? ?Follow Up Recommendations ? Acute inpatient rehab (3hours/day) ?  ?  ?Assistance Recommended at Discharge Frequent or constant Supervision/Assistance  ?Patient can return home with the following Two people to help with walking and/or transfers;Two people to help with bathing/dressing/bathroom;Assistance with cooking/housework;Assistance with feeding;Direct supervision/assist for medications management;Assist for transportation;Direct supervision/assist for financial management;Help with stairs or ramp for entrance ?   ?Equipment Recommendations ? Wheelchair (measurements PT);Wheelchair cushion (measurements PT);Hospital bed (hoyer lift)  ?  ?Recommendations for Other Services   ? ? ?  ?Precautions / Restrictions Precautions ?Precautions: Fall;Other (comment) ?Precaution Comments: R inattention and hemiplegia, cortrak  ?  ? ?Mobility ? Bed Mobility ?Overal bed mobility: Needs Assistance ?Bed Mobility: Supine to Sit, Sit to Supine ?  ?  ?Supine to sit: Max assist ?Sit to supine: +2 for physical assistance, Max assist ?  ?General bed mobility comments: assist with RLE and trunk, increased time, multimodal cues for sequencing/technique ?  ? ?Transfers ?Overall transfer level: Needs assistance ?  ?Transfers: Sit to/from Stand ?Sit to Stand: +2 physical assistance, +2 safety/equipment, Max assist, Mod assist ?  ?  ?  ?  ?  ?General transfer comment: Sit to stand x 4 trials from EOB. +2 max initially progressing to +2 mod.  30 second static stand in stedy first 3 trials and 1 minute on 4th trial. ?Transfer via Lift Equipment: Stedy ? ?Ambulation/Gait ?  ?  ?  ?  ?  ?  ?  ?  ? ? ?Stairs ?  ?  ?  ?  ?  ? ? ?Wheelchair Mobility ?  ? ?Modified Rankin (Stroke Patients Only) ?Modified Rankin (Stroke Patients Only) ?Pre-Morbid Rankin Score: No symptoms ?Modified Rankin: Severe disability ? ? ?  ?Balance Overall balance assessment: Needs assistance ?Sitting-balance support: Single extremity supported, Feet supported ?Sitting balance-Leahy Scale: Poor ?  ?Postural control: Right lateral lean ?Standing balance support: Bilateral upper extremity supported, Reliant on assistive device for balance ?Standing balance-Leahy Scale: Zero ?Standing balance comment: +2 mod assist static stand in stedy. R lateral lean. Stedy knee pad able to block buckling. ?  ?  ?  ?  ?  ?  ?  ?  ?  ?  ?  ?  ? ?  ?  Cognition Arousal/Alertness: Awake/alert ?Behavior During Therapy: Flat affect ?Overall Cognitive Status: Impaired/Different from baseline ?Area of Impairment:  Following commands, Safety/judgement, Awareness, Problem solving, Attention ?  ?  ?  ?  ?  ?  ?  ?  ?  ?Current Attention Level: Sustained ?  ?Following Commands: Follows one step commands inconsistently ?Safety/Judgement: Decreased awareness of safety, Decreased awareness of deficits ?Awareness: Intellectual ?Problem Solving: Slow processing, Decreased initiation, Difficulty sequencing, Requires tactile cues, Requires verbal cues ?General Comments: Answers 'yeah' to all questions; however, facial expression changes appopriately. ?  ?  ? ?  ?Exercises   ? ?  ?General Comments General comments (skin integrity, edema, etc.): VSS on RA ?  ?  ? ?Pertinent Vitals/Pain Pain Assessment ?Pain Assessment: Faces ?Faces Pain Scale: No hurt  ? ? ?Home Living   ?  ?  ?  ?  ?  ?  ?  ?  ?  ?   ?  ?Prior Function    ?  ?  ?   ? ?PT Goals (current goals can now be found in the care plan section) Acute Rehab PT Goals ?Patient Stated Goal: not stated (expressive difficulties), no family present ?Progress towards PT goals: Progressing toward goals ? ?  ?Frequency ? ? ? Min 4X/week ? ? ? ?  ?PT Plan Current plan remains appropriate  ? ? ?Co-evaluation   ?  ?  ?  ?  ? ?  ?AM-PAC PT "6 Clicks" Mobility   ?Outcome Measure ? Help needed turning from your back to your side while in a flat bed without using bedrails?: A Lot ?Help needed moving from lying on your back to sitting on the side of a flat bed without using bedrails?: A Lot ?Help needed moving to and from a bed to a chair (including a wheelchair)?: Total ?Help needed standing up from a chair using your arms (e.g., wheelchair or bedside chair)?: Total ?Help needed to walk in hospital room?: Total ?Help needed climbing 3-5 steps with a railing? : Total ?6 Click Score: 8 ? ?  ?End of Session Equipment Utilized During Treatment: Gait belt ?Activity Tolerance: Patient tolerated treatment well ?Patient left: in bed;with call bell/phone within reach;with bed alarm set ?Nurse Communication:  Mobility status;Need for lift equipment ?PT Visit Diagnosis: Other abnormalities of gait and mobility (R26.89);Other symptoms and signs involving the nervous system (R29.898);Hemiplegia and hemiparesis ?Hemiplegia - Right/Left: Right ?Hemiplegia - dominant/non-dominant: Dominant ?Hemiplegia - caused by: Cerebral infarction ?  ? ? ?Time: [phone] ?PT Time Calculation (min) (ACUTE ONLY): 24 min ? ?Charges:  $Therapeutic Activity: 23-37 mins          ?          ? ?Lorrin Goodell, PT  ?Office # [phone] ?Pager [phone] ? ? ? ? ?Lorriane Shire ?02/18/2022, 9:08 AM ? ?

## 2022-02-18 NOTE — H&P (Signed)
? ? ?Physical Medicine and Rehabilitation Admission H&P ? ?  ?Chief Complaint  ?Patient presents with  ? Stroke with functional deficits.   ? ? ?HPI: Gordon Fisher. Micco is a 61 year old male in relatively good health who was admitted via [facility] on 02/11/22 with right sided weakness, right facial droop and slurred speech. CTH showed L-MCA hyperdense sign and he received TNK prior to transfer. CTA showed L-MCA MI occlusion and he underwent cerebral angio with thrombectomy and TICI 3 flow by Dr. Nelida Gores [name].  Follow up MRI brain done revealing small areas of acute infarct infarct in L-MCA territory with mild hemorrhagic transformation in left parietal lobe and L-MCA bifurcation residual vascular thrombus. He was taken back for thrombectomy with stent for dissection but had intra-stent occlusion which was unable to be recanalized. Post op CT showed small SAH which was stable on follow up. He was started on ASA/Brillinta. He was started on tube feeds for nutritional support and MBS 04/18 showed oral dysphagia with oral delay and no penetration or aspiration. He was started on D3, thins with supervision.  ? ?TEE showed EF 60-65%, was negative for thrombus, PFO and showed grade II plaque in descending aorta.  Repeat MRI brain on 04/16 showed extensive evolving acute ischemic L-MCA occlusion increased in size with associated edema, few additional small acute cortical subcortical infarcts involving contralateral right frontal lobe and left ACA distribution. Stroke felt to be due to unknown etiology and wife elected on Zio patch on discharge to rule out A fib. 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 CIR recommended due to functional decline.  ? ? ?Review of Systems  ?Unable to perform ROS: Language  ? ? ?No past medical history on file. ? ? ?Past Surgical History:  ?Procedure Laterality Date  ? BUBBLE STUDY   02/15/2022  ? Procedure: BUBBLE STUDY;  Surgeon: Werner Lean, MD;  Location: [facility];  Service: Cardiovascular;;  ? IR CT HEAD LTD  02/11/2022  ? IR CT HEAD LTD  02/11/2022  ? IR CT HEAD LTD  02/11/2022  ? IR PERCUTANEOUS ART THROMBECTOMY/INFUSION INTRACRANIAL INC DIAG ANGIO  02/11/2022  ? IR PERCUTANEOUS ART THROMBECTOMY/INFUSION INTRACRANIAL INC DIAG ANGIO  02/11/2022  ? IR US GUIDE VASC ACCESS RIGHT  02/11/2022  ? IR US GUIDE VASC ACCESS RIGHT  02/11/2022  ? RADIOLOGY WITH ANESTHESIA N/A 02/11/2022  ? Procedure: IR WITH ANESTHESIA;  Surgeon: Radiologist, Medication, MD;  Location: [facility];  Service: Radiology;  Laterality: N/A;  ? RADIOLOGY WITH ANESTHESIA N/A 02/11/2022  ? Procedure: RADIOLOGY WITH ANESTHESIA;  Surgeon: Radiologist, Medication, MD;  Location: [place];  Service: Radiology;  Laterality: N/A;  ? TEE WITHOUT CARDIOVERSION N/A 02/15/2022  ? Procedure: TRANSESOPHAGEAL ECHOCARDIOGRAM (TEE);  Surgeon: Werner Lean, MD;  Location: [facility];  Service: Cardiovascular;  Laterality: N/A;  ? ? ?Family Hx: unable to elicit ? ? ?Social History:  has no history on file for tobacco use, alcohol use, and drug use. ? ? ?Allergies: Not on File ? ? ?Medications Prior to Admission  ?Medication Sig Dispense Refill  ? [START ON 02/19/2022] aspirin 81 MG chewable tablet Chew 1 tablet (81 mg total) by mouth daily. 30 tablet 1  ? atorvastatin (LIPITOR) 40 MG tablet Place 1 tablet (40 mg total) into feeding tube every evening. 30 tablet 1  ? Calcium Carbonate Antacid (TUMS PO) Take 1-2 tablets by mouth daily as needed (acid reflux/indigestion).    ?  ticagrelor (BRILINTA) 90 MG TABS tablet Place 1 tablet (90 mg total) into feeding tube 2 (two) times daily. 60 tablet 1  ? ? ?Home: ?Home Living ?Family/patient expects to be discharged to:: Private residence ?Living Arrangements: Spouse/significant other ?Available Help at Discharge: Family, Available PRN/intermittently ?Type of Home: House ?Home Access: Stairs  to enter ?Entrance Stairs-Number of Steps: 5+3 ?Entrance Stairs-Rails: Right, Left ?Home Layout: Two level, Able to live on main level with bedroom/bathroom ?Bathroom Shower/Tub: Walk-in shower ?Bathroom Toilet: Standard ?Home Equipment: None ? Lives With: Spouse ?  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  ?Functional Status:  ?Mobility: ?Bed Mobility ?Overal bed mobility: Needs Assistance ?Bed Mobility: Supine to Sit, Sit to Supine ?Supine to sit: Max assist ?Sit to supine: +2 for physical assistance, Max assist ?General bed mobility comments: assist with RLE and trunk, increased time, multimodal cues for sequencing/technique ?Transfers ?Overall transfer level: Needs assistance ?Equipment used: 2 person hand held assist, Ambulation equipment used (3 musketeer transfer with R knee blocked) ?Transfers: Sit to/from Stand ?Sit to Stand: +2 physical assistance, +2 safety/equipment, Max assist, Mod assist ?Bed to/from chair/wheelchair/BSC transfer type:: Via Lift equipment (used stedy) ?Transfer via Lift Equipment: Stedy ?General transfer comment: Sit to stand x 4 trials from EOB. +2 max initially progressing to +2 mod.  30 second static stand in stedy first 3 trials and 1 minute on 4th trial. ?  ?ADL: ?ADL ?Overall ADL's : Needs assistance/impaired ?Eating/Feeding: NPO ?Eating/Feeding Details (indicate cue type and reason): coretrack ?Grooming: Moderate assistance ?Grooming Details (indicate cue type and reason): Max hand over hand for bringing wash clothe to face. ?Upper Body Bathing: Moderate assistance ?Lower Body Bathing: Maximal assistance ?Upper Body Dressing : Maximal assistance ?Lower Body Dressing: Maximal assistance ?Lower Body Dressing Details (indicate cue type and reason): once sock placed on foot, pt cued, then able to pull sock up and over heel ?Toileting- Clothing  Manipulation and Hygiene: Total assistance ?Toileting - Clothing Manipulation Details (indicate cue type and reason): incontinent of BM ?Functional mobility during ADLs: +2 for physical assistance, Moderate assistance ?General ADL Comments: Max A-Total A for ADLs ?  ?Cognition: ?Cognition ?Overall Cognitive Status: Impaired/Different from baseline ?Arousal/Alertness: Awake/alert ?Orientation Level: Other (comment) (UTA 2/2 expressive and receptive aphasia) ?Attention: Focused ?Focused Attention: Impaired ?Focused Attention Impairment: Verbal basic, Functional basic ?Awareness: Impaired ?Behaviors: Perseveration, Impulsive ?Cognition ?Arousal/Alertness: Awake/alert ?Behavior During Therapy: Flat affect ?Overall Cognitive Status: Impaired/Different from baseline ?Area of Impairment: Following commands, Safety/judgement, Awareness, Problem solving, Attention ?Current Attention Level: Sustained ?Following Commands: Follows one step commands inconsistently ?Safety/Judgement: Decreased awareness of safety, Decreased awareness of deficits ?Awareness: Intellectual ?Problem Solving: Slow processing, Decreased initiation, Difficulty sequencing, Requires tactile cues, Requires verbal cues ?General Comments: Answers 'yeah' to all questions; however, facial expression changes appopriately. ?Difficult to assess due to: Impaired communication ?  ? ? ?There were no vitals taken for this visit. ?Physical Exam ?Vitals and nursing note reviewed.  ?Constitutional:   ?   Appearance: Normal appearance.  ?   Comments: Obese male with cortak in nares. NAD.   ?HEENT: +NGT ?Cardio: RRR ?Pulmonary: Unlabored breathing ?Abdominal:  ?   General: There is no distension.  ?   Comments: Small reducible umbilical hernia  ?Skin: ?   Comments: Left antecubital area with multiple dry healing blisters? Abrasions?   ?Neurological:  ?   Mental Status: He is alert.  ?   Comments: Mild right  inattention but able to  move eyes to right field with cues.  Right hemiplegia with emerging tone at elbow and knee. Mild right facial weakness and verbal output limited to "yeah" to all questions. He was unable to recognize his name, point to items or follow simple motor c

## 2022-02-18 NOTE — Progress Notes (Signed)
[id number] ZIO AT applied at discharge ? ?Dr. Lalla Brothers to read.  ?

## 2022-02-18 NOTE — Plan of Care (Signed)
?  Problem: Consults ?Goal: RH STROKE PATIENT EDUCATION ?Description: See Patient Education module for education specifics  ?Outcome: Progressing ?  ?Problem: RH BOWEL ELIMINATION ?Goal: RH STG MANAGE BOWEL WITH ASSISTANCE ?Description: STG Manage Bowel with mod I Assistance. ?Outcome: Progressing ?Goal: RH STG MANAGE BOWEL W/MEDICATION W/ASSISTANCE ?Description: STG Manage Bowel with Medication with mod I Assistance. ?Outcome: Progressing ?  ?Problem: RH SAFETY ?Goal: RH STG ADHERE TO SAFETY PRECAUTIONS W/ASSISTANCE/DEVICE ?Description: STG Adhere to Safety Precautions With cues Assistance/Device. ?Outcome: Progressing ?  ?Problem: RH PAIN MANAGEMENT ?Goal: RH STG PAIN MANAGED AT OR BELOW PT'S PAIN GOAL ?Description: At or below level 4 with prns ?Outcome: Progressing ?  ?Problem: RH KNOWLEDGE DEFICIT ?Goal: RH STG INCREASE KNOWLEDGE OF DIABETES ?Description: Patient and spouse will be able to manage prediabetes with dietary modifications using handouts and educational materials independently ?Outcome: Progressing ?Goal: RH STG INCREASE KNOWLEDGE OF DYSPHAGIA/FLUID INTAKE ?Description: Patient and spouse will be able to manage dysphagia, medications and dietary modifications using handouts and educational materials independently ?Outcome: Progressing ?Goal: RH STG INCREASE KNOWLEGDE OF HYPERLIPIDEMIA ?Description: Patient and spouse will be able to manage HLD with medications and dietary modifications using handouts and educational materials independently ?Outcome: Progressing ?Goal: RH STG INCREASE KNOWLEDGE OF STROKE PROPHYLAXIS ?Description: Patient and spouse will be able to manage secondary stroke risks with medications and dietary modifications using handouts and educational materials independently ?Outcome: Progressing ?  ?

## 2022-02-18 NOTE — Progress Notes (Signed)
Speech Language Pathology Treatment: Dysphagia;Cognitive-Linquistic  ?Patient Details ?Name: Gordon Fisher ?MRN: [id number] ?DOB: 06-30-1961 ?Today's Date: 02/18/2022 ?Time: 1205-1228 ?SLP Time Calculation (min) (ACUTE ONLY): 23 min ? ?Assessment / Plan / Recommendation ?Clinical Impression ? Pt seen for dysphagia tx assessing tolerance of Dysphagia 3/thin liquid diet with impulsivity affecting intake without use of mod verbal/tactile cues during consumption, primarily of thin liquids with an immediate cough noted with larger volume of thin liquids via straw.  Discussed risk of aspiration with wife and importance of swallowing precauttions of small bites/sips, slow rate and min-mod cues to address attention deficits during consumption of POs.  Pt with slightly impaired bolus preparation/propulsion likely d/t motor deficit, but pt was able to form bolus cohesively and propel into pharynx without overt s/s of aspiration noted throughout session.  Recommend continuing current diet of Dysphagia 3/thin liquids with swallowing precautions and FULL supervision. ? ?Pt was seen for a cog/linguistic tx with yes/no responses noted to be 40% accurate with simple yes/no questions (ie: are you in a hospital? Is it snowing outside?) with pt responding "nah" x1 during interactions and encouraged to use gestures such as thumbs up/down to answer yes/no d/t perseveration with "yeah" when asked questions during tx.  Wife reports he is attempting to use other words "look" and using nonverbal communication with her intermittently.  Discussed potential use of communication board and given to wife for yes/no and simple requests via pictures and encouraging simple directives to be followed with functional tasks.  Functional naming tasks during interactions with family/caregivers during meals/personal care as pt able to encourage independence.  Wife in agreement with plan.  ST will continue to f/u in acute setting and recommend CIR when pt  able.   ?  ?HPI HPI: 61 y.o. male with no significant PMH who presented to [facility] on 02/11/22 with R sided weakness, R facial droop, slurred speech. CTA with L MCA M1 . Patient was transferred to [facility] for NIR thrombectomy.   Initial NIR procedure demonstrated occlusion of the distal left M1 MCA; mechanical thrombectomy was completed with TICI 3 recanalization. Post-procedure CT and MRI Brain were completed. Several hours postprocedure, patient experienced recurrence of neurologic deficits prompting return to NIR; procedure demonstrated distal L M1 occlusion prompting repeat thrombectomy and stent placement x 2. Stent unfortunately became occluded and was unable to be crossed. ?  ?   ?SLP Plan ? Continue with current plan of care ? ?  ?  ?Recommendations for follow up therapy are one component of a multi-disciplinary discharge planning process, led by the attending physician.  Recommendations may be updated based on patient status, additional functional criteria and insurance authorization. ?  ? ?Recommendations  ?Diet recommendations: Thin liquid;Dysphagia 3 (mechanical soft) ?Liquids provided via: Cup;Straw ?Medication Administration: Whole meds with puree ?Supervision: Patient able to self feed;Staff to assist with self feeding;Full supervision/cueing for compensatory strategies ?Compensations: Slow rate;Small sips/bites;Minimize environmental distractions ?Postural Changes and/or Swallow Maneuvers: Seated upright 90 degrees  ?   ?    ?   ? ? ? ? Oral Care Recommendations: Oral care BID ?Follow Up Recommendations: Acute inpatient rehab (3hours/day) ?Assistance recommended at discharge: Frequent or constant Supervision/Assistance ?SLP Visit Diagnosis: Dysphagia, oropharyngeal phase (R13.12);Aphasia (R47.01);Attention and concentration deficit;Cognitive communication deficit (R41.841) ?Attention and concentration deficit following: Cerebral infarction ?Plan: Continue with current plan of care ? ? ? ? ?  ?   ? ? ?Tressie Stalker, M.S., CCC-SLP ? ?02/18/2022, 1:15 PM ?

## 2022-02-18 NOTE — PMR Pre-admission (Signed)
PMR Admission Coordinator Pre-Admission Assessment ? ?Patient: Gordon Fisher is an 61 y.o., male ?MRN: [id number] ?DOB: 14-Mar-1961 ?Height: 5' 6"  (167.6 cm) ?Weight: 106.6 kg ? ?Insurance Information ?HMO:     PPO:      PCP:      IPA:      80/20:      OTHER:  ?PRIMARY: [employer] Commercial      Policy#: [id number]      Subscriber: pt ?CM Name: Yetta Flock      Phone#: [phone]     Fax#: [phone] ?Pre-Cert#: [id number] auth for CIR from [place] at [facility] with updates due to Janyce Llanos (phone [phone] ext [phone]) at fax listed above on 4/26      Employer:  ?Benefits:  Phone #: [phone]     Name:  ?Eff. Date: 11/01/21     Deduct: $500 (met)      Out of Pocket Max: $5000 (met [id number])      Life Max: n/a ?CIR: 80%      SNF: 80% ?Outpatient:      Co-Pay: $10/visit ?Home Health: 100%      Co-Pay:  ?DME: 80%     Co-Ins: 20% ?Providers:  ?SECONDARY:       Policy#:      Phone#:  ? ?Financial Counselor:       Phone#:  ? ?The ?Data Collection Information Summary? for patients in Inpatient Rehabilitation Facilities with attached ?Privacy Act [place] Records? was provided and verbally reviewed with: N/A ? ?Emergency Contact Information ?Contact Information   ? ? Name Relation Home Work Mobile  ? Gordon Fisher Spouse [phone]  [phone]  ? Sandy Salaam Daughter   [phone]  ? ?  ? ? ?Current Medical History  ?Patient Admitting Diagnosis: L MCA CVA ? ?History of Present Illness: Pt is a 61 y/o with no significant PMH and limited physician follow up over the last several years who presented to [facility] on 02/11/22 with c/o R sided weakness, R facial droop, and slurred speech.  CT head w/o contrast revealed hyperdense L MCA with ASPECTS of 10.  CTA showed L MCA M1 occlusion.  He was given tNK and underwent mechanical thrombectomy x2 with stent placement for left M2 dissection followed by stent reocclusion.  MRI showed small areas of acute infarct in left MCA territory involving caudate putamen, insula,  and frontal/parietal lobe.  Repeat CT on 4/14 showed evolving left MCA infarct, small SAH.  Repeat MRI showed extensive evolving acute ischemic left MCA infarct and ongoing small SAH.  2D Echo showed EF 60-65%, TEE and bubble negative.  Recommend f/u for cardiac cause with Zio patch at discharge.  Neurology recommends aspirin 81 mg and Brilinta.  Pt tolerating D3/thin diet.  Therapy ongoing and recommendations are for CIR.   ? ?Complete NIHSS TOTAL: 17 ? ?Patient's medical record from [facility] has been reviewed by the rehabilitation admission coordinator and physician. ? ?Past Medical History  ?History reviewed. No pertinent past medical history. ? ?Has the patient had major surgery during 100 days prior to admission? No ? ?Family History   ?family history is not on file. ? ?Current Medications ? ?Current Facility-Administered Medications:  ?  acetaminophen (TYLENOL) tablet 650 mg, 650 mg, Oral, Q4H PRN **OR** acetaminophen (TYLENOL) 160 MG/5ML solution 650 mg, 650 mg, Per Tube, Q4H PRN, 650 mg at 02/13/22 0914 **OR** acetaminophen (TYLENOL) suppository 650 mg, 650 mg, Rectal, Q4H PRN, de Sindy Messing, Erven Colla, MD ?  aspirin chewable tablet 81 mg, 81 mg, Oral,  Daily **OR** aspirin chewable tablet 81 mg, 81 mg, Per Tube, Daily, de Sindy Messing, [place], MD, 81 mg at 02/18/22 8366 ?  atorvastatin (LIPITOR) tablet 40 mg, 40 mg, Per Tube, QPM, de Yolanda Manges, Cortney E, NP, 40 mg at 02/17/22 1645 ?  chlorhexidine (PERIDEX) 0.12 % solution 15 mL, 15 mL, Mouth Rinse, BID, Rosalin Hawking, MD, 15 mL at 02/18/22 0905 ?  docusate (COLACE) 50 MG/5ML liquid 100 mg, 100 mg, Per Tube, Daily, de Yolanda Manges, Cortney E, NP, 100 mg at 02/17/22 0804 ?  feeding supplement (OSMOLITE 1.5 CAL) liquid 1,000 mL, 1,000 mL, Per Tube, Continuous, Ollis, Brandi L, NP, Last Rate: 60 mL/hr at 02/18/22 0216, 1,000 mL at 02/18/22 0216 ?  feeding supplement (PROSource TF) liquid 45 mL, 45 mL, Per Tube, BID, Ollis, Brandi L, NP, 45 mL at 02/18/22  0905 ?  insulin aspart (novoLOG) injection 0-15 Units, 0-15 Units, Subcutaneous, Q4H, Gleason, Otilio Carpen, PA-C, 3 Units at 02/18/22 2947 ?  labetalol (NORMODYNE) injection 5-10 mg, 5-10 mg, Intravenous, Q2H PRN, Rosalin Hawking, MD ?  MEDLINE mouth rinse, 15 mL, Mouth Rinse, q12n4p, Rosalin Hawking, MD, 15 mL at 02/17/22 1645 ?  ondansetron (ZOFRAN) injection 4 mg, 4 mg, Intravenous, Q6H PRN, de Sindy Messing, [place], MD ?  pantoprazole sodium (PROTONIX) 40 mg/20 mL oral suspension 40 mg, 40 mg, Per Tube, Daily, Rosalin Hawking, MD, 40 mg at 02/18/22 0905 ?  polyethylene glycol (MIRALAX / GLYCOLAX) packet 17 g, 17 g, Per Tube, Daily, de Yolanda Manges, Cortney E, NP, 17 g at 02/17/22 0805 ?  senna-docusate (Senokot-S) tablet 1 tablet, 1 tablet, Oral, QHS PRN, Donnetta Simpers, MD ?  ticagrelor (BRILINTA) tablet 90 mg, 90 mg, Per Tube, BID, de Yolanda Manges, Cortney E, NP, 90 mg at 02/18/22 6546 ? ?Patients Current Diet:  ?Diet Order   ? ?       ?  DIET DYS 3 Room service appropriate? Yes; Fluid consistency: Thin  Diet effective now       ?  ? ?  ?  ? ?  ? ? ?Precautions / Restrictions ?Precautions ?Precautions: Fall, Other (comment) ?Precaution Comments: R inattention and hemiplegia, cortrak ?Restrictions ?Weight Bearing Restrictions: No  ? ?Has the patient had 2 or more falls or a fall with injury in the past year? Yes ? ?Prior Activity Level ?Community (5-7x/wk): fully independent prior to admit, retired, working on his farm, driving, no DME used ? ?Prior Functional Level ?Self Care: Did the patient need help bathing, dressing, using the toilet or eating? Independent ? ?Indoor Mobility: Did the patient need assistance with walking from room to room (with or without device)? Independent ? ?Stairs: Did the patient need assistance with internal or external stairs (with or without device)? Independent ? ?Functional Cognition: Did the patient need help planning regular tasks such as shopping or remembering to take medications?  Independent ? ?Patient Information ?Are you of Hispanic, Latino/a,or Spanish origin?: A. No, not of Hispanic, Latino/a, or Spanish origin ?What is your race?: A. White ?Do you need or want an interpreter to communicate with a doctor or health care staff?: 0. No ? ?Patient's Response To:  ?Health Literacy and Transportation ?Is the patient able to respond to health literacy and transportation needs?: No ?Health Literacy - How often do you need to have someone help you when you read instructions, pamphlets, or other written material from your doctor or pharmacy?: Patient unable to respond ?In the past 12 months, has lack of transportation kept  you from medical appointments or from getting medications?: No (proxy) ?In the past 12 months, has lack of transportation kept you from meetings, work, or from getting things needed for daily living?: No (proxy) ? ?Home Assistive Devices / Equipment ?Home Assistive Devices/Equipment: Eyeglasses ?Home Equipment: None ? ?Prior Device Use: Indicate devices/aids used by the patient prior to current illness, exacerbation or injury? None of the above ? ?Current Functional Level ?Cognition ? Arousal/Alertness: Awake/alert ?Overall Cognitive Status: Impaired/Different from baseline ?Difficult to assess due to: Impaired communication ?Current Attention Level: Sustained ?Orientation Level: Other (comment) (UTA 2/2 expressive and receptive aphasia) ?Following Commands: Follows one step commands inconsistently ?Safety/Judgement: Decreased awareness of safety, Decreased awareness of deficits ?General Comments: Answers 'yeah' to all questions; however, facial expression changes appopriately. ?Attention: Focused ?Focused Attention: Impaired ?Focused Attention Impairment: Verbal basic, Functional basic ?Awareness: Impaired ?Behaviors: Perseveration, Impulsive ?   ?Extremity Assessment ?(includes Sensation/Coordination) ? Upper Extremity Assessment: RUE deficits/detail ?RUE Deficits / Details:  small active movements spontaneously however not moving on command; appears to demonstrate begninning of flexor sunergy ?RUE Coordination: decreased fine motor, decreased gross motor (nonfunctional)  ?Lower Ext

## 2022-02-18 NOTE — Discharge Summary (Addendum)
Stroke Discharge Summary  ?Patient ID: Gordon Fisher    l ?  MRN: [id number]    ?  DOB: 10-18-1961 ? ?Date of Admission: 02/11/2022 ?Date of Discharge: 02/18/2022 ? ?Attending Physician:  Stroke, Md, MD, Stroke MD ?Consultant(s):   pulmonary/intensive care ?Patient's PCP:  Pcp, No ? ?Discharge Diagnoses:  left MCA infarct due to left M1 occlusion s/p TNK and thrombectomy x2 with stent placement for left M2 dissection followed by stent reocclusion, etiology unclear ?Principal Problem: ?  Cerebrovascular accident (CVA) due to occlusion of left middle cerebral artery ([name]) ? ? ?Medications to be continued on Rehab ?Allergies as of 02/18/2022   ?Not on File ?  ? ?  ?Medication List  ?  ? ?TAKE these medications   ? ?aspirin 81 MG chewable tablet ?Chew 1 tablet (81 mg total) by mouth daily. ?Start taking on: February 19, 2022 ?  ?atorvastatin 40 MG tablet ?Commonly known as: LIPITOR ?Place 1 tablet (40 mg total) into feeding tube every evening. ?  ?ticagrelor 90 MG Tabs tablet ?Commonly known as: BRILINTA ?Place 1 tablet (90 mg total) into feeding tube 2 (two) times daily. ?  ?TUMS PO ?Take 1-2 tablets by mouth daily as needed (acid reflux/indigestion). ?  ? ?  ? ? ?LABORATORY STUDIES ?CBC ?   ?Component Value Date/Time  ? WBC 10.6 (H) 02/17/2022 0300  ? RBC 5.01 02/17/2022 0300  ? HGB 16.1 02/17/2022 0300  ? HCT 45.2 02/17/2022 0300  ? PLT 196 02/17/2022 0300  ? MCV 90.2 02/17/2022 0300  ? MCH 32.1 02/17/2022 0300  ? MCHC 35.6 02/17/2022 0300  ? RDW 12.5 02/17/2022 0300  ? LYMPHSABS 2.9 02/12/2022 0421  ? MONOABS 2.0 (H) 02/12/2022 0421  ? EOSABS 0.0 02/12/2022 0421  ? BASOSABS 0.0 02/12/2022 0421  ? ?CMP ?   ?Component Value Date/Time  ? NA 133 (L) 02/17/2022 0300  ? K 3.9 02/17/2022 0300  ? CL 103 02/17/2022 0300  ? CO2 23 02/17/2022 0300  ? GLUCOSE 190 (H) 02/17/2022 0300  ? BUN 23 (H) 02/17/2022 0300  ? CREATININE 0.99 02/17/2022 0300  ? CALCIUM 8.7 (L) 02/17/2022 0300  ? PROT 7.2 02/11/2022 0654  ? ALBUMIN 4.1 02/11/2022  0654  ? AST 23 02/11/2022 0654  ? ALT 27 02/11/2022 0654  ? ALKPHOS 68 02/11/2022 0654  ? BILITOT 0.7 02/11/2022 0654  ? GFRNONAA >60 02/17/2022 0300  ? ?COAGS ?Lab Results  ?Component Value Date  ? INR 1.0 02/11/2022  ? ?Lipid Panel ?   ?Component Value Date/Time  ? CHOL 198 02/12/2022 0421  ? TRIG 146 02/12/2022 0421  ? HDL 47 02/12/2022 0421  ? CHOLHDL 4.2 02/12/2022 0421  ? VLDL 29 02/12/2022 0421  ? [place] 122 (H) 02/12/2022 0421  ? ?HgbA1C  ?Lab Results  ?Component Value Date  ? HGBA1C 6.0 (H) 02/11/2022  ? ?Urinalysis ?   ?Component Value Date/Time  ? COLORURINE YELLOW 02/11/2022 1608  ? APPEARANCEUR CLEAR 02/11/2022 1608  ? LABSPEC 1.033 (H) 02/11/2022 1608  ? PHURINE 5.0 02/11/2022 1608  ? GLUCOSEU NEGATIVE 02/11/2022 1608  ? HGBUR NEGATIVE 02/11/2022 1608  ? [place] NEGATIVE 02/11/2022 1608  ? KETONESUR 5 (A) 02/11/2022 1608  ? PROTEINUR NEGATIVE 02/11/2022 1608  ? NITRITE NEGATIVE 02/11/2022 1608  ? LEUKOCYTESUR NEGATIVE 02/11/2022 1608  ? ?Urine Drug Screen  ?   ?Component Value Date/Time  ? LABOPIA NONE DETECTED 02/11/2022 1608  ? COCAINSCRNUR NONE DETECTED 02/11/2022 1608  ? LABBENZ NONE DETECTED 02/11/2022 1608  ?  AMPHETMU NONE DETECTED 02/11/2022 1608  ? THCU NONE DETECTED 02/11/2022 1608  ? LABBARB NONE DETECTED 02/11/2022 1608  ?  ?Alcohol Level ?   ?Component Value Date/Time  ? ETH <10 02/11/2022 0654  ? ? ? ?SIGNIFICANT DIAGNOSTIC STUDIES ?CT ANGIO HEAD NECK W WO CM ? ?Result Date: 02/11/2022 ?CLINICAL DATA:  Code stroke. 61 year old male with sudden onset right side weakness. EXAM: CT ANGIOGRAPHY HEAD AND NECK TECHNIQUE: Multidetector CT imaging of the head and neck was performed using the standard protocol during bolus administration of intravenous contrast. Multiplanar CT image reconstructions and MIPs were obtained to evaluate the vascular anatomy. Carotid stenosis measurements (when applicable) are obtained utilizing NASCET criteria, using the distal internal carotid diameter as the  denominator. RADIATION DOSE REDUCTION: This exam was performed according to the departmental dose-optimization program which includes automated exposure control, adjustment of the mA and/or kV according to patient size and/or use of iterative reconstruction technique. CONTRAST:  21mL OMNIPAQUE IOHEXOL 350 MG/ML SOLN COMPARISON:  Plain head CT 0659 hours. FINDINGS: CTA NECK Skeleton: No acute osseous abnormality identified. Ordinary cervical spine degeneration. Upper chest: Atelectasis and mediastinal lipomatosis. Other neck: Small volume retained secretions in the pharynx. Otherwise negative. Aortic arch: 3 vessel arch configuration. Minimal arch atherosclerosis. Right carotid system: Tortuous brachiocephalic artery without plaque or stenosis. Minor soft and calcified plaque at the right carotid bifurcation without stenosis. Left carotid system: Mild tortuosity and carotid bifurcation plaque with no stenosis. Vertebral arteries: Negative.  Mildly dominant left vertebral artery. CTA HEAD Posterior circulation: Minimal right V4 calcified plaque without stenosis. Dominant left V4 segment. Normal PICA origins and vertebrobasilar junction. Patent basilar artery without stenosis. Patent SCA and PCA origins. Posterior communicating arteries are diminutive or absent. PCA branches are within normal limits. Anterior circulation: Both ICA siphons are patent. Mild to moderate calcified plaque primarily in the supraclinoid segments. Mild supraclinoid stenosis results greater on the left. Patent carotid termini, MCA and ACA origins. Anterior communicating artery and ACA branches are within normal limits. Right MCA M1 segment and bifurcation are patent without stenosis. Right MCA branches are within normal limits. Left MCA origin is patent but the left M1 is occluded about 12 mm distal to the origin (series 7, image 97). The left anterior temporal artery origin is patent proximal to the occlusion and there is good reconstitution  of left MCA branches Venous sinuses: Patent. Anatomic variants: Dominant left vertebral artery. Review of the MIP images confirms the above findings IMPRESSION: 1. 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 This was discussed by telephone with Dr. Delora Fuel on [id number] at 0711 hours. Anterior temporal artery remains patent and there is robust MCA branch collateral enhancement. 2. Generally mild for age atherosclerosis, most pronounced in the supraclinoid ICA siphons with calcified plaque. No other significant arterial stenosis. Electronically Signed   By: Genevie Ann M.D.   On: 02/11/2022 07:20  ? ?DG Abd 1 View ? ?Result Date: 02/15/2022 ?CLINICAL DATA:  Feeding tube placement. EXAM: ABDOMEN - 1 VIEW COMPARISON:  None. FINDINGS: The bowel gas pattern is normal. Distal tip of feeding tube is seen in expected position of distal stomach. No radio-opaque calculi or other significant radiographic abnormality are seen. IMPRESSION: Distal tip feeding tube seen in expected position of distal stomach. No abnormal bowel dilatation is noted. Electronically Signed   By: Marijo Conception M.D.   On: 02/15/2022 11:56  ? ?CT HEAD WO CONTRAST (5MM) ? ?Result Date: 02/12/2022 ?CLINICAL DATA:  Provided history: Neuro  deficit, acute, stroke suspected. 24 hours post TNKase. EXAM: CT HEAD WITHOUT CONTRAST TECHNIQUE: Contiguous axial images were obtained from the base of the skull through the vertex without intravenous contrast. RADIATION DOSE REDUCTION: This exam was performed according to the departmental dose-optimization program which includes automated exposure control, adjustment of the mA and/or kV according to patient size and/or use of iterative reconstruction technique. COMPARISON:  Prior head CT examinations performed 02/11/2022. CT angiogram head/neck 02/11/2022. Brain MRI 02/11/2022. FINDINGS: Brain: Cerebral volume is normal. Redemonstrated known acute left MCA territory infarcts within the left frontal,  parietal and temporal lobes as well as left insula and left basal ganglia. Since the most recent prior head CT of 02/11/2022, there has been slight further demarcation of these infarcts (for instance at the left

## 2022-02-18 NOTE — Plan of Care (Signed)
?  Problem: Education: ?Goal: Knowledge of disease or condition will improve ?Outcome: Progressing ?Goal: Knowledge of secondary prevention will improve (SELECT ALL) ?Outcome: Progressing ?Goal: Knowledge of patient specific risk factors will improve (INDIVIDUALIZE FOR PATIENT) ?Outcome: Progressing ?  ?

## 2022-02-18 NOTE — Progress Notes (Signed)
Inpatient Rehabilitation Admission Medication Review by a Pharmacist ? ?A complete drug regimen review was completed for this patient to identify any potential clinically significant medication issues. ? ?High Risk Drug Classes Is patient taking? Indication by Medication  ?Antipsychotic Yes Compazine- N/V  ?Anticoagulant Yes Lovenox- VTE prophylaxis  ?Antibiotic No   ?Opioid No   ?Antiplatelet Yes Aspirin, Brilinta- CVA prophylaxis  ?Hypoglycemics/insulin Yes Insulin ss- blood sugar control. No formal diagnosis of T2DM noted (HgA1c 6.0)  ?Vasoactive Medication No   ?Chemotherapy No   ?Other Yes Lipitor- HLD ?Protonix- GERD ?Trazodone- sleep ?Tums- indigestion w/ meals / calcium supplementation  ? ? ? ?Type of Medication Issue Identified Description of Issue Recommendation(s)  ?Drug Interaction(s) (clinically significant) ?    ?Duplicate Therapy ?    ?Allergy ?    ?No Medication Administration End Date ?    ?Incorrect Dose ?    ?Additional Drug Therapy Needed ?    ?Significant med changes from prior encounter (inform family/care partners about these prior to discharge).    ?Other ?    ? ? ?Clinically significant medication issues were identified that warrant physician communication and completion of prescribed/recommended actions by midnight of the next day:  No ? ? ?Time spent performing this drug regimen review (minutes):  30 ? ? ?Rockney Grenz BS, PharmD, BCPS ?Clinical Pharmacist ?02/19/2022 7:05 AM ? ?Contact: [phone] after 3 PM ? ?"Be curious, not judgmental..." -Debbora Dus ?

## 2022-02-18 NOTE — Progress Notes (Signed)
Inpatient Rehab Admissions Coordinator:   ? ?I have insurance approval and a bed available for pt to admit to CIR today. Dr. Erlinda Hong in agreement.  Will let pt/family and TOC team know.  ? ?Shann Medal, PT, DPT ?Admissions Coordinator ?[phone] ?02/18/22  ?11:01 AM  ? ?

## 2022-02-18 NOTE — Progress Notes (Signed)
PMR Admission Coordinator Pre-Admission Assessment ?  ?Patient: Gordon Fisher is an 61 y.o., male ?MRN: [id number] ?DOB: Mar 13, 1961 ?Height: 5' 6"  (167.6 cm) ?Weight: 106.6 kg ?  ?Insurance Information ?HMO:     PPO:      PCP:      IPA:      80/20:      OTHER:  ?PRIMARY: [employer] Commercial      Policy#: [id number]      Subscriber: pt ?CM Name: Yetta Flock      Phone#: [phone]     Fax#: [phone] ?Pre-Cert#: [id number] auth for CIR from [place] [facility] with updates due to Janyce Llanos (phone [phone] ext [phone]) at fax listed above on 4/26      Employer:  ?Benefits:  Phone #: [phone]     Name:  ?Eff. Date: 11/01/21     Deduct: $500 (met)      Out of Pocket Max: $5000 (met [phone])      Life Max: n/a ?CIR: 80%      SNF: 80% ?Outpatient:      Co-Pay: $10/visit ?Home Health: 100%      Co-Pay:  ?DME: 80%     Co-Ins: 20% ?Providers:  ?SECONDARY:       Policy#:      Phone#:  ?  ?Financial Counselor:       Phone#:  ?  ?The ?Data Collection Information Summary? for patients in Inpatient Rehabilitation Facilities with attached ?Privacy Act [place] Records? was provided and verbally reviewed with: N/A ?  ?Emergency Contact Information ?Contact Information   ?  ?  Name Relation Home Work Mobile  ?  ZAVIEN, CLUBB Spouse [phone]   [phone]  ?  Sandy Salaam Daughter     [phone]  ?  ?   ?  ?  ?Current Medical History  ?Patient Admitting Diagnosis: L MCA CVA ?  ?History of Present Illness: Pt is a 61 y/o with no significant PMH and limited physician follow up over the last several years who presented to [facility] on 02/11/22 with c/o R sided weakness, R facial droop, and slurred speech.  CT head w/o contrast revealed hyperdense L MCA with ASPECTS of 10.  CTA showed L MCA M1 occlusion.  He was given tNK and underwent mechanical thrombectomy x2 with stent placement for left M2 dissection followed by stent reocclusion.  MRI showed small areas of acute infarct in left MCA territory involving caudate  putamen, insula, and frontal/parietal lobe.  Repeat CT on 4/14 showed evolving left MCA infarct, small SAH.  Repeat MRI showed extensive evolving acute ischemic left MCA infarct and ongoing small SAH.  2D Echo showed EF 60-65%, TEE and bubble negative.  Recommend f/u for cardiac cause with Zio patch at discharge.  Neurology recommends aspirin 81 mg and Brilinta.  Pt tolerating D3/thin diet.  Therapy ongoing and recommendations are for CIR.   ?  ?Complete NIHSS TOTAL: 17 ?  ?Patient's medical record from [facility] has been reviewed by the rehabilitation admission coordinator and physician. ?  ?Past Medical History  ?History reviewed. No pertinent past medical history. ?  ?Has the patient had major surgery during 100 days prior to admission? No ?  ?Family History   ?family history is not on file. ?  ?Current Medications ?  ?Current Facility-Administered Medications:  ?  acetaminophen (TYLENOL) tablet 650 mg, 650 mg, Oral, Q4H PRN **OR** acetaminophen (TYLENOL) 160 MG/5ML solution 650 mg, 650 mg, Per Tube, Q4H PRN, 650 mg at 02/13/22 0914 **OR** acetaminophen (  TYLENOL) suppository 650 mg, 650 mg, Rectal, Q4H PRN, de Sindy Messing, Erven Colla, MD ?  aspirin chewable tablet 81 mg, 81 mg, Oral, Daily **OR** aspirin chewable tablet 81 mg, 81 mg, Per Tube, Daily, de Sindy Messing, [place], MD, 81 mg at 02/18/22 0814 ?  atorvastatin (LIPITOR) tablet 40 mg, 40 mg, Per Tube, QPM, de Yolanda Manges, Cortney E, NP, 40 mg at 02/17/22 1645 ?  chlorhexidine (PERIDEX) 0.12 % solution 15 mL, 15 mL, Mouth Rinse, BID, Rosalin Hawking, MD, 15 mL at 02/18/22 0905 ?  docusate (COLACE) 50 MG/5ML liquid 100 mg, 100 mg, Per Tube, Daily, de Yolanda Manges, Cortney E, NP, 100 mg at 02/17/22 0804 ?  feeding supplement (OSMOLITE 1.5 CAL) liquid 1,000 mL, 1,000 mL, Per Tube, Continuous, Ollis, Brandi L, NP, Last Rate: 60 mL/hr at 02/18/22 0216, 1,000 mL at 02/18/22 0216 ?  feeding supplement (PROSource TF) liquid 45 mL, 45 mL, Per Tube, BID, Ollis, Brandi  L, NP, 45 mL at 02/18/22 0905 ?  insulin aspart (novoLOG) injection 0-15 Units, 0-15 Units, Subcutaneous, Q4H, Gleason, Otilio Carpen, PA-C, 3 Units at 02/18/22 4818 ?  labetalol (NORMODYNE) injection 5-10 mg, 5-10 mg, Intravenous, Q2H PRN, Rosalin Hawking, MD ?  MEDLINE mouth rinse, 15 mL, Mouth Rinse, q12n4p, Rosalin Hawking, MD, 15 mL at 02/17/22 1645 ?  ondansetron (ZOFRAN) injection 4 mg, 4 mg, Intravenous, Q6H PRN, de Sindy Messing, [place], MD ?  pantoprazole sodium (PROTONIX) 40 mg/20 mL oral suspension 40 mg, 40 mg, Per Tube, Daily, Rosalin Hawking, MD, 40 mg at 02/18/22 0905 ?  polyethylene glycol (MIRALAX / GLYCOLAX) packet 17 g, 17 g, Per Tube, Daily, de Yolanda Manges, Cortney E, NP, 17 g at 02/17/22 0805 ?  senna-docusate (Senokot-S) tablet 1 tablet, 1 tablet, Oral, QHS PRN, Donnetta Simpers, MD ?  ticagrelor (BRILINTA) tablet 90 mg, 90 mg, Per Tube, BID, de Yolanda Manges, Cortney E, NP, 90 mg at 02/18/22 5631 ?  ?Patients Current Diet:  ?Diet Order   ?  ?         ?    DIET DYS 3 Room service appropriate? Yes; Fluid consistency: Thin  Diet effective now       ?  ?  ?   ?  ?  ?   ?  ?  ?Precautions / Restrictions ?Precautions ?Precautions: Fall, Other (comment) ?Precaution Comments: R inattention and hemiplegia, cortrak ?Restrictions ?Weight Bearing Restrictions: No  ?  ?Has the patient had 2 or more falls or a fall with injury in the past year? Yes ?  ?Prior Activity Level ?Community (5-7x/wk): fully independent prior to admit, retired, working on his farm, driving, no DME used ?  ?Prior Functional Level ?Self Care: Did the patient need help bathing, dressing, using the toilet or eating? Independent ?  ?Indoor Mobility: Did the patient need assistance with walking from room to room (with or without device)? Independent ?  ?Stairs: Did the patient need assistance with internal or external stairs (with or without device)? Independent ?  ?Functional Cognition: Did the patient need help planning regular tasks such as shopping or  remembering to take medications? Independent ?  ?Patient Information ?Are you of Hispanic, Latino/a,or Spanish origin?: A. No, not of Hispanic, Latino/a, or Spanish origin ?What is your race?: A. White ?Do you need or want an interpreter to communicate with a doctor or health care staff?: 0. No ?  ?Patient's Response To:  ?Health Literacy and Transportation ?Is the patient able to respond to health literacy and  transportation needs?: No ?Health Literacy - How often do you need to have someone help you when you read instructions, pamphlets, or other written material from your doctor or pharmacy?: Patient unable to respond ?In the past 12 months, has lack of transportation kept you from medical appointments or from getting medications?: No (proxy) ?In the past 12 months, has lack of transportation kept you from meetings, work, or from getting things needed for daily living?: No (proxy) ?  ?Home Assistive Devices / Equipment ?Home Assistive Devices/Equipment: Eyeglasses ?Home Equipment: None ?  ?Prior Device Use: Indicate devices/aids used by the patient prior to current illness, exacerbation or injury? None of the above ?  ?Current Functional Level ?Cognition ?  Arousal/Alertness: Awake/alert ?Overall Cognitive Status: Impaired/Different from baseline ?Difficult to assess due to: Impaired communication ?Current Attention Level: Sustained ?Orientation Level: Other (comment) (UTA 2/2 expressive and receptive aphasia) ?Following Commands: Follows one step commands inconsistently ?Safety/Judgement: Decreased awareness of safety, Decreased awareness of deficits ?General Comments: Answers 'yeah' to all questions; however, facial expression changes appopriately. ?Attention: Focused ?Focused Attention: Impaired ?Focused Attention Impairment: Verbal basic, Functional basic ?Awareness: Impaired ?Behaviors: Perseveration, Impulsive ?   ?Extremity Assessment ?(includes Sensation/Coordination) ?  Upper Extremity Assessment: RUE  deficits/detail ?RUE Deficits / Details: small active movements spontaneously however not moving on command; appears to demonstrate begninning of flexor sunergy ?RUE Coordination: decreased fine motor,

## 2022-02-18 NOTE — TOC Transition Note (Signed)
Transition of Care (TOC) - CM/SW Discharge Note ? ? ?Patient Details  ?Name: Gordon Fisher ?MRN: [id number] ?Date of Birth: 01-05-1961 ? ?Transition of Care (TOC) CM/SW Contact:  ?Kermit Balo, RN ?Phone Number: ?02/18/2022, 1:00 PM ? ? ?Clinical Narrative:    ?Patient is discharging to CIR today. CM is signing off.  ? ? ?Final next level of care: IP Rehab Facility ?Barriers to Discharge: No Barriers Identified ? ? ?Patient Goals and CMS Choice ?  ?  ?Choice offered to / list presented to : Patient ? ?Discharge Placement ?  ?           ?  ?  ?  ?  ? ?Discharge Plan and Services ?  ?  ?           ?  ?  ?  ?  ?  ?  ?  ?  ?  ?  ? ?Social Determinants of Health (SDOH) Interventions ?  ? ? ?Readmission Risk Interventions ?   ? View : No data to display.  ?  ?  ?  ? ? ? ? ? ?

## 2022-02-19 LAB — GLUCOSE, CAPILLARY
Glucose-Capillary: 132 mg/dL — ABNORMAL HIGH (ref 70–99)
Glucose-Capillary: 133 mg/dL — ABNORMAL HIGH (ref 70–99)
Glucose-Capillary: 164 mg/dL — ABNORMAL HIGH (ref 70–99)
Glucose-Capillary: 134 mg/dL — ABNORMAL HIGH (ref 70–99)
Glucose-Capillary: 141 mg/dL — ABNORMAL HIGH (ref 70–99)

## 2022-02-19 LAB — CBC WITH DIFFERENTIAL/PLATELET
Abs Immature Granulocytes: 0.09 10*3/uL — ABNORMAL HIGH (ref 0.00–0.07)
Basophils Absolute: 0.1 10*3/uL (ref 0.0–0.1)
Basophils Relative: 1 %
Eosinophils Absolute: 0.3 10*3/uL (ref 0.0–0.5)
Eosinophils Relative: 3 %
HCT: 45.3 % (ref 39.0–52.0)
Hemoglobin: 15.8 g/dL (ref 13.0–17.0)
Immature Granulocytes: 1 %
Lymphocytes Relative: 32 %
Lymphs Abs: 2.8 10*3/uL (ref 0.7–4.0)
MCH: 31.3 pg (ref 26.0–34.0)
MCHC: 34.9 g/dL (ref 30.0–36.0)
MCV: 89.9 fL (ref 80.0–100.0)
Monocytes Absolute: 1.7 10*3/uL — ABNORMAL HIGH (ref 0.1–1.0)
Monocytes Relative: 19 %
Neutro Abs: 3.8 10*3/uL (ref 1.7–7.7)
Neutrophils Relative %: 44 %
Platelets: 186 10*3/uL (ref 150–400)
RBC: 5.04 MIL/uL (ref 4.22–5.81)
RDW: 12.4 % (ref 11.5–15.5)
WBC: 8.7 10*3/uL (ref 4.0–10.5)
nRBC: 0 % (ref 0.0–0.2)

## 2022-02-19 LAB — COMPREHENSIVE METABOLIC PANEL
ALT: 29 U/L (ref 0–44)
AST: 26 U/L (ref 15–41)
Albumin: 3.1 g/dL — ABNORMAL LOW (ref 3.5–5.0)
Alkaline Phosphatase: 68 U/L (ref 38–126)
Anion gap: 9 (ref 5–15)
BUN: 24 mg/dL — ABNORMAL HIGH (ref 6–20)
CO2: 23 mmol/L (ref 22–32)
Calcium: 8.8 mg/dL — ABNORMAL LOW (ref 8.9–10.3)
Chloride: 103 mmol/L (ref 98–111)
Creatinine, Ser: 1.11 mg/dL (ref 0.61–1.24)
GFR, Estimated: >60 mL/min (ref >60)
Glucose, Bld: 126 mg/dL — ABNORMAL HIGH (ref 70–99)
Potassium: 4.1 mmol/L (ref 3.5–5.1)
Sodium: 135 mmol/L (ref 135–145)
Total Bilirubin: 0.9 mg/dL (ref 0.3–1.2)
Total Protein: 6.6 g/dL (ref 6.5–8.1)

## 2022-02-19 MED ORDER — TICAGRELOR 90 MG PO TABS
90.0000 mg | ORAL_TABLET | Freq: Two times a day (BID) | ORAL | Status: DC
  Administered 2022-02-19 – 2022-03-19 (×56): 90 mg via ORAL
  Filled 2022-02-19 (×56): qty 1

## 2022-02-19 MED ORDER — ENSURE ENLIVE PO LIQD
237.0000 mL | Freq: Three times a day (TID) | ORAL | Status: DC
  Administered 2022-02-19 – 2022-02-24 (×10): 237 mL via ORAL

## 2022-02-19 MED ORDER — PANTOPRAZOLE 2 MG/ML SUSPENSION
40.0000 mg | Freq: Every day | ORAL | Status: DC
  Administered 2022-02-20 – 2022-03-19 (×28): 40 mg via ORAL
  Filled 2022-02-19 (×28): qty 20

## 2022-02-19 MED ORDER — PROSOURCE TF PO LIQD
90.0000 mL | Freq: Two times a day (BID) | ORAL | Status: DC
  Administered 2022-02-19: 90 mL
  Filled 2022-02-19: qty 90

## 2022-02-19 MED ORDER — POLYETHYLENE GLYCOL 3350 17 G PO PACK
17.0000 g | PACK | Freq: Every day | ORAL | Status: DC
  Administered 2022-02-20 – 2022-03-19 (×24): 17 g via ORAL
  Filled 2022-02-19 (×26): qty 1

## 2022-02-19 MED ORDER — OSMOLITE 1.5 CAL PO LIQD
600.0000 mL | ORAL | Status: DC
Start: 2022-02-19 — End: 2022-02-22
  Administered 2022-02-19 – 2022-02-21 (×3): 600 mL
  Filled 2022-02-19 (×6): qty 711

## 2022-02-19 MED ORDER — ATORVASTATIN CALCIUM 40 MG PO TABS
40.0000 mg | ORAL_TABLET | Freq: Every evening | ORAL | Status: DC
  Administered 2022-02-19 – 2022-03-18 (×28): 40 mg via ORAL
  Filled 2022-02-19 (×28): qty 1

## 2022-02-19 NOTE — Progress Notes (Signed)
?                                                       PROGRESS NOTE ? ? ?Subjective/Complaints: ? ?Lab work reviewed , low alb but otherwise wnl ?Eating breakfast with supervision , no cough  ? ?ROS- neg CP, SOB, N/V/D ? ?Objective: ?  ?No results found. ?Recent Labs  ?  02/17/22 ?0300 02/19/22 ?2952  ?WBC 10.6* 8.7  ?HGB 16.1 15.8  ?HCT 45.2 45.3  ?PLT 196 186  ? ?Recent Labs  ?  02/17/22 ?0300 02/19/22 ?8413  ?NA 133* 135  ?K 3.9 4.1  ?CL 103 103  ?CO2 23 23  ?GLUCOSE 190* 126*  ?BUN 23* 24*  ?CREATININE 0.99 1.11  ?CALCIUM 8.7* 8.8*  ? ? ?Intake/Output Summary (Last 24 hours) at 02/19/2022 0800 ?Last data filed at 02/18/2022 1842 ?Gross per 24 hour  ?Intake 177 ml  ?Output 0 ml  ?Net 177 ml  ?  ? ?  ? ?Physical Exam: ?Vital Signs ?Blood pressure 133/76, pulse 75, temperature 97.8 ?F (36.6 ?C), temperature source Oral, resp. rate 18, height 5\' 8"  (1.727 m), weight 106 kg, SpO2 94 %. ? ?General: No acute distress ?Mood and affect are appropriate ?Heart: Regular rate and rhythm no rubs murmurs or extra sounds ?Lungs: Clear to auscultation, breathing unlabored, no rales or wheezes ?Abdomen: Positive bowel sounds, soft nontender to palpation, nondistended ?Extremities: No clubbing, cyanosis, or edema ?Skin: No evidence of breakdown, no evidence of rash ?Neurologic: Cranial nerves II through XII intact, motor strength is 5/5 in left and 0/5 RIght deltoid, bicep, tricep, grip, hip flexor, knee extensors, ankle dorsiflexor and plantar flexor ?Trace Right hip add ?Exp> recept aphasia , answers yes to all questions  ?Musculoskeletal: Full range of motion in all 4 extremities. No joint swelling ? ? ? ?Assessment/Plan: ?1. Functional deficits which require 3+ hours per day of interdisciplinary therapy in a comprehensive inpatient rehab setting. ?Physiatrist is providing close team supervision and 24 hour management of active medical problems listed below. ?Physiatrist and rehab team continue to assess barriers to  discharge/monitor patient progress toward functional and medical goals ? ?Care Tool: ? ?Bathing ?   ?   ?   ?  ?  ?Bathing assist   ?  ?  ?Upper Body Dressing/Undressing ?Upper body dressing   ?  ?   ?Upper body assist   ?   ?Lower Body Dressing/Undressing ?Lower body dressing ? ? ?   ?  ? ?  ? ?Lower body assist   ?   ? ?Toileting ?Toileting    ?Toileting assist   ?  ?  ?Transfers ?Chair/bed transfer ? ?Transfers assist ?   ? ?  ?  ?  ?Locomotion ?Ambulation ? ? ?Ambulation assist ? ?   ? ?  ?  ?   ? ?Walk 10 feet activity ? ? ?Assist ?   ? ?  ?   ? ?Walk 50 feet activity ? ? ?Assist   ? ?  ?   ? ? ?Walk 150 feet activity ? ? ?Assist   ? ?  ?  ?  ? ?Walk 10 feet on uneven surface  ?activity ? ? ?Assist   ? ? ?  ?   ? ?Wheelchair ? ? ? ? ?Assist   ?  ?  ? ?  ?   ? ? ?  Wheelchair 50 feet with 2 turns activity ? ? ? ?Assist ? ?  ?  ? ? ?   ? ?Wheelchair 150 feet activity  ? ? ? ?Assist ?   ? ? ?   ? ?Blood pressure 133/76, pulse 75, temperature 97.8 ?F (36.6 ?C), temperature source Oral, resp. rate 18, height 5\' 8"  (1.727 m), weight 106 kg, SpO2 94 %. ? ?Medical Problem List and Plan: ?1. 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            -patient may not shower ?            -ELOS/Goals: 21-23 days ?            Admit to CIR ?2.  Antithrombotics: ?-DVT/anticoagulation:  Pharmaceutical: Lovenox added as 5 days out from [facility] ?            -antiplatelet therapy:  on ASA/Brillinta ?3. Pain Management: Tylenol prn.  ?4. Mood: LCSW to follow for evaluation and support when appropriate.  ?            -antipsychotic agents: N/A ?5. Neuropsych: This patient is not capable of making decisions on his own behalf. ?6. Skin/Wound Care: Routine pressure relief measures.  ?7. Fluids/Electrolytes/Nutrition: Will monitor I/O. CMET ok except low alb  ?8. Hyponatremia: Stable. Monitor with serial checks.  ?9. Prediabetes: Hgb A1C-6.0. BS poorly controlled due to continuous tube feeds.  ?--Add CM restrictions to diet and have  dietician educate pt/wife ?--Monitor BS ac/hs for now. ?10. Dysphagia: D3 thin liq Will change tube feeds to 10 hours from 6 pm to 4 am to help stimulate appetite.  Hope to d/c NG tube on Monday or tues ?            --Start calorie count X 72 hours in am. ?11. Leucocytosis: resolved 4/21 ?         ? ?  Latest Ref Rng & Units 02/19/2022  ?  5:27 AM 02/17/2022  ?  3:00 AM 02/16/2022  ?  2:00 AM  ?CBC  ?WBC 4.0 - 10.5 K/uL 8.7   10.6   9.7    ?Hemoglobin 13.0 - 17.0 g/dL 02/18/2022   56.3   87.5    ?Hematocrit 39.0 - 52.0 % 45.3   45.2   45.6    ?Platelets 150 - 400 K/uL 186   196   195    ?  ?12. Elevated LDL: continue Lipitor. ?13.  AKI: Encourage fluid intake. Repeat creatinine tomorrow.  ?14. Hypocalcemia:  Ionized Ca- 1.11 with normal albumin-4.1.  ?--Add calcium supplement bid.  ?15. Elevated Homocysteine levels:  B12, folate WNL--discussed with Dr. 64.3 who felt no treatment needed as less than 50.  ?16. Obesity: BMI 35.53- provide dietary education. ?  ? ?LOS: ?1 days ?A FACE TO FACE EVALUATION WAS PERFORMED ? ?Roda Shutters Janeece Blok ?02/19/2022, 8:00 AM  ? ? ? ?

## 2022-02-19 NOTE — Progress Notes (Signed)
Initial Nutrition Assessment ? ?DOCUMENTATION CODES:  ? ?Obesity unspecified ? ?INTERVENTION:  ? ?72 hour calorie count initiated. RD to follow up Monday for results.  ? ?Provide Ensure Enlive po TID, each supplement provides 350 kcal and 20 grams of protein. ? ?Continue nocturnal tube feeds via Cortrak NGT using Osmolite 1.5 cal formula at rate of 60 ml/hr x 10 hours (1800-0400). ?Provide 90 ml Prosource TF BID per tube.  ?Tube feeding regimen provides 1060 kcal (48% of kcal needs), 82 grams of protein (74% of protein needs), and 456 ml free water.  ? ?NUTRITION DIAGNOSIS:  ? ?Inadequate oral intake related to dysphagia as evidenced by meal completion < 25%. ? ?GOAL:  ? ?Patient will meet greater than or equal to 90% of their needs ? ?MONITOR:  ? ?PO intake, Supplement acceptance, Diet advancement, Labs, Weight trends, TF tolerance, Skin, I & O's ? ?REASON FOR ASSESSMENT:  ? ?Consult ?Calorie Count, Assessment of nutrition requirement/status ? ?ASSESSMENT:  ? ?61 year old male presents with right sided weakness, right facial droop and slurred speech. CTA showed L-MCA MI occlusion and he underwent cerebral angio with thrombectomy. MRI brain done revealing small areas of acute infarct infarct in L-MCA territory with mild hemorrhagic transformation in left parietal lobe and L-MCA bifurcation residual vascular thrombus. Post op CT showed small SAH. Patient with resultant global aphasia. 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 Admitted to CIR. ? ?Pt is currently on a dysphagia 3 diet with thin liquids. Meal completion 20% at breakfast this morning. Family at bedside has been encouraging pt po intake. RD to order Ensure to aid in caloric and protein needs. Family reports pt was eating well PTA with no difficulties. Cortrak NGT remains in place for nocturnal tube feeds for supplemental nutrition as pt continues with poor po. MD has ordered 72 hours calorie count, in hopes to remove NGT on Mon/Tues. Pt encouraged to  eat his food at meals.  ? ?NUTRITION - FOCUSED PHYSICAL EXAM: ? ?Flowsheet Row Most Recent Value  ?Orbital Region No depletion  ?Upper Arm Region No depletion  ?Thoracic and Lumbar Region No depletion  ?Clavicle Bone Region No depletion  ?Clavicle and Acromion Bone Region No depletion  ?Scapular Bone Region Unable to assess  ?Dorsal Hand No depletion  ?Patellar Region No depletion  ?Anterior Thigh Region No depletion  ?Posterior Calf Region No depletion  ?Edema (RD Assessment) None  ?Hair Reviewed  ?Eyes Reviewed  ?Mouth Reviewed  ?Skin Reviewed  ?Nails Reviewed  ? ?  ? ?Labs and medications reviewed.  ? ?Diet Order:   ?Diet Order   ? ?       ?  DIET DYS 3 Room service appropriate? Yes; Fluid consistency: Thin  Diet effective now       ?  ? ?  ?  ? ?  ? ? ?EDUCATION NEEDS:  ? ?Not appropriate for education at this time ? ?Skin:  Skin Assessment: Reviewed RN Assessment ? ?Last BM:  4/19 ? ?Height:  ? ?Ht Readings from Last 1 Encounters:  ?02/18/22 5\' 8"  (1.727 m)  ? ? ?Weight:  ? ?Wt Readings from Last 1 Encounters:  ?02/18/22 106 kg  ? ?BMI:  Body mass index is 35.53 kg/m?. ? ?Estimated Nutritional Needs:  ? ?Kcal:  2200-2400 ? ?Protein:  110-125 grams ? ?Fluid:  >/= 2 L/day ? ?02/20/22, MS, RD, LDN ?RD pager number/after hours weekend pager number on Amion. ? ?

## 2022-02-19 NOTE — Plan of Care (Signed)
?  Problem: Consults ?Goal: RH STROKE PATIENT EDUCATION ?Description: See Patient Education module for education specifics  ?Outcome: Progressing ?  ?Problem: RH BOWEL ELIMINATION ?Goal: RH STG MANAGE BOWEL WITH ASSISTANCE ?Description: STG Manage Bowel with mod I Assistance. ?Outcome: Progressing ?Goal: RH STG MANAGE BOWEL W/MEDICATION W/ASSISTANCE ?Description: STG Manage Bowel with Medication with mod I Assistance. ?Outcome: Progressing ?  ?Problem: RH SAFETY ?Goal: RH STG ADHERE TO SAFETY PRECAUTIONS W/ASSISTANCE/DEVICE ?Description: STG Adhere to Safety Precautions With cues Assistance/Device. ?Outcome: Progressing ?  ?Problem: RH PAIN MANAGEMENT ?Goal: RH STG PAIN MANAGED AT OR BELOW PT'S PAIN GOAL ?Description: At or below level 4 with prns ?Outcome: Progressing ?  ?Problem: RH KNOWLEDGE DEFICIT ?Goal: RH STG INCREASE KNOWLEDGE OF DIABETES ?Description: Patient and spouse will be able to manage prediabetes with dietary modifications using handouts and educational materials independently ?Outcome: Progressing ?Goal: RH STG INCREASE KNOWLEDGE OF DYSPHAGIA/FLUID INTAKE ?Description: Patient and spouse will be able to manage dysphagia, medications and dietary modifications using handouts and educational materials independently ?Outcome: Progressing ?Goal: RH STG INCREASE KNOWLEGDE OF HYPERLIPIDEMIA ?Description: Patient and spouse will be able to manage HLD with medications and dietary modifications using handouts and educational materials independently ?Outcome: Progressing ?Goal: RH STG INCREASE KNOWLEDGE OF STROKE PROPHYLAXIS ?Description: Patient and spouse will be able to manage secondary stroke risks with medications and dietary modifications using handouts and educational materials independently ?Outcome: Progressing ?  ?

## 2022-02-19 NOTE — Plan of Care (Deleted)
?  Problem: RH Comprehension Communication ?Goal: LTG Patient will comprehend basic/complex auditory (SLP) ?Description: LTG: Patient will comprehend basic/complex auditory information with cues (SLP). ?Flowsheets (Taken 02/19/2022 1735) ?LTG: Patient will comprehend: Basic auditory information ?LTG: Patient will comprehend auditory information with cueing (SLP): Minimal Assistance - Patient > 75% ?  ?Problem: RH Expression Communication ?Goal: LTG Patient will express needs/wants via multi-modal(SLP) ?Description: LTG:  Patient will express needs/wants via multi-modal communication (gestures/written, etc) with cues (SLP) ?Flowsheets (Taken 02/19/2022 1735) ?LTG: Patient will express needs/wants via multimodal communication (gestures/written, etc) with cueing (SLP): ? Moderate Assistance - Patient 50 - 74% ? Maximal Assistance - Patient 25 - 49% ?  ?

## 2022-02-19 NOTE — Evaluation (Signed)
Physical Therapy Assessment and Plan ? ?Patient Details  ?Name: Gordon Fisher ?MRN: [id number] ?Date of Birth: September 22, 1961 ? ?PT Diagnosis: Abnormal posture, Abnormality of gait, Difficulty walking, and Hemiplegia dominant ?Rehab Potential: Good ?ELOS: 4 weeks.  ? ?Today's Date: 02/19/2022 ?PT Individual Time: 1300-1400 ?PT Individual Time Calculation (min): 60 min   ? ?Hospital Problem: Principal Problem: ?  Acute ischemic left middle cerebral artery (MCA) stroke (HCC) ? ? ?Past Medical History: History reviewed. No pertinent past medical history. ?Past Surgical History:  ?Past Surgical History:  ?Procedure Laterality Date  ? BUBBLE STUDY  02/15/2022  ? Procedure: BUBBLE STUDY;  Surgeon: Werner Lean, MD;  Location: [facility];  Service: Cardiovascular;;  ? IR CT HEAD LTD  02/11/2022  ? IR CT HEAD LTD  02/11/2022  ? IR CT HEAD LTD  02/11/2022  ? IR PERCUTANEOUS ART THROMBECTOMY/INFUSION INTRACRANIAL INC DIAG ANGIO  02/11/2022  ? IR PERCUTANEOUS ART THROMBECTOMY/INFUSION INTRACRANIAL INC DIAG ANGIO  02/11/2022  ? IR US GUIDE VASC ACCESS RIGHT  02/11/2022  ? IR US GUIDE VASC ACCESS RIGHT  02/11/2022  ? RADIOLOGY WITH ANESTHESIA N/A 02/11/2022  ? Procedure: IR WITH ANESTHESIA;  Surgeon: Radiologist, Medication, MD;  Location: [facility];  Service: Radiology;  Laterality: N/A;  ? RADIOLOGY WITH ANESTHESIA N/A 02/11/2022  ? Procedure: RADIOLOGY WITH ANESTHESIA;  Surgeon: Radiologist, Medication, MD;  Location: [facility];  Service: Radiology;  Laterality: N/A;  ? TEE WITHOUT CARDIOVERSION N/A 02/15/2022  ? Procedure: TRANSESOPHAGEAL ECHOCARDIOGRAM (TEE);  Surgeon: Werner Lean, MD;  Location: [facility];  Service: Cardiovascular;  Laterality: N/A;  ? ? ?Assessment & Plan ?Clinical Impression: Gordon Fisher is a 61 year old male in relatively good health who was admitted via [facility] on 02/11/22 with right sided weakness, right facial droop and slurred speech. CTH showed L-MCA hyperdense sign and he received TNK prior  to transfer. CTA showed L-MCA MI occlusion and he underwent cerebral angio with thrombectomy and TICI 3 flow by Dr. Nelida Gores [name].  Follow up MRI brain done revealing small areas of acute infarct infarct in L-MCA territory with mild hemorrhagic transformation in left parietal lobe and L-MCA bifurcation residual vascular thrombus. He was taken back for thrombectomy with stent for dissection but had intra-stent occlusion which was unable to be recanalized. Post op CT showed small SAH which was stable on follow up. He was started on ASA/Brillinta. He was started on tube feeds for nutritional support and MBS 04/18 showed oral dysphagia with oral delay and no penetration or aspiration. He was started on D3, thins with supervision.  ?  ?TEE showed EF 60-65%, was negative for thrombus, PFO and showed grade II plaque in descending aorta.  Repeat MRI brain on 04/16 showed extensive evolving acute ischemic L-MCA occlusion increased in size with associated edema, few additional small acute cortical subcortical infarcts involving contralateral right frontal lobe and left ACA distribution. Stroke felt to be due to unknown etiology and wife elected on Zio patch on discharge to rule out A fib. 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 CIR recommended due to functional decline.  ? ?Patient currently requires total with mobility secondary to muscle weakness and muscle paralysis, abnormal tone and decreased coordination, and decreased attention and decreased safety awareness.  Prior to hospitalization, patient was independent  with mobility and lived with Spouse, Son in a House home.  Home access is 5+3Stairs to enter. ? ?Patient will benefit  from skilled PT intervention to maximize safe functional mobility, minimize fall risk, and decrease caregiver burden for planned discharge home with 24 hour assist.  Anticipate  patient will  require HHPT vs outpt PT.  at discharge. ? ?PT - End of Session ?Endurance Deficit: Yes ?Endurance Deficit Description: fatigue after sitting in TIS > 3 hours. ?PT Assessment ?Rehab Potential (ACUTE/IP ONLY): Good ?PT Barriers to Discharge: Inaccessible home environment ?PT Barriers to Discharge Comments: > 6 STE ?PT Patient demonstrates impairments in the following area(s): Balance;Safety;Endurance;Sensory;Motor ?PT Transfers Functional Problem(s): Bed Mobility;Bed to Chair;Car;Furniture ?PT Locomotion Functional Problem(s): Ambulation;Stairs;Wheelchair Mobility ?PT Plan ?PT Intensity: Minimum of 1-2 x/day ,45 to 90 minutes ?PT Frequency: 5 out of 7 days ?PT Duration Estimated Length of Stay: 4 weeks. ?PT Treatment/Interventions: Ambulation/gait training;Discharge planning;DME/adaptive equipment instruction;Functional mobility training;Therapeutic Activities;UE/LE Strength taining/ROM;Visual/perceptual remediation/compensation;Balance/vestibular training;Community reintegration;Neuromuscular re-education;Patient/family education;Stair training;Therapeutic Exercise;UE/LE Coordination activities;Wheelchair propulsion/positioning ?PT Transfers Anticipated Outcome(s): CGA. ?PT Locomotion Anticipated Outcome(s): Min A w/ appropriate AD ?PT Recommendation ?Recommendations for Other Services: Neuropsych consult ?Follow Up Recommendations: Other (comment) (HHPT vs outpt PT) ?Patient destination: Home ? ? ?PT Evaluation ?Precautions/Restrictions ?Precautions ?Precautions: Fall;Other (comment) ?Precaution Comments: R hemi, global aphasia, cortrak ?Restrictions ?Weight Bearing Restrictions: No ?General ?Chart Reviewed: Yes ?Family/Caregiver Present: Yes Vital SignsTherapy Vitals ?Temp: 98.6 ?F (37 ?C) ?Pulse Rate: 88 ?Resp: 15 ?BP: 122/90 ?Patient Position (if appropriate): Lying ?Oxygen Therapy ?SpO2: 92 % ?O2 Device: Room Air ?Pain ?Pain Assessment ?Pain Scale: 0-10 ?Pain Score: 0-No pain (does not c/o  pain) ?Pain Interference ?Pain Interference ?Pain Effect on Sleep: 8. Unable to answer (Pt verbalizes "Yeah" to all questions, does not appear to have any pain and spouse states no appearance of pain.) ?Pain Interference with Therapy Activities: 8. Unable to answer ?Pain Interference with Day-to-Day Activities: 8. Unable to answer ?Home Living/Prior Functioning ?Home Living ?Available Help at Discharge: Family;Available 24 hours/day ?Type of Home: House ?Home Access: Stairs to enter ?Entrance Stairs-Number of Steps: 5+3 ?Entrance Stairs-Rails: Right;Left ?Home Layout: Two level;Able to live on main level with bedroom/bathroom ?Bathroom Shower/Tub: Walk-in shower (2-3" lip) ?Bathroom Toilet: Standard ? Lives With: Spouse;Son ?Prior Function ?Level of Independence: Independent with transfers;Independent with gait ? Able to Take Stairs?: Yes ?Driving: Yes ?Vision/Perception  ?Vision - History ?Ability to See in Adequate Light: 0 Adequate ?Vision - Assessment ?Eye Alignment: Within Functional Limits ?Alignment/Gaze Preference: Head tilt;Gaze left ?Tracking/Visual Pursuits: Able to track stimulus in all quads without difficulty ?Saccades: Decreased speed of saccadic movement ?Convergence: Within functional limits ?Perception ?Perception: Impaired ?Inattention/Neglect: Does not attend to right side of body (although spouse states moving his R UE using Left.) ?Praxis ?Praxis: Impaired ?Praxis Impairment Details: Perseveration;Motor planning;Initiation ?Praxis-Other Comments: perseverative on sounds when attempting to speak  ?Cognition ?Overall Cognitive Status: Impaired/Different from baseline ?Arousal/Alertness: Awake/alert ?Orientation Level: Oriented to person ?Attention: Sustained ?Sustained Attention: Impaired ?Sustained Attention Impairment: Verbal basic;Functional basic ?Memory: Impaired (difficulty fully assessing 2/2 global aphasia) ?Awareness: Impaired ?Awareness Impairment: Intellectual impairment ?Behaviors:  Perseveration;Impulsive ?Safety/Judgment: Impaired ?Comments: Pt able to approximate "blue" and "bed" when asked to repeat, was able to point to correct day/month/year in written field of 2. ?Sensation ?Sensation ?L

## 2022-02-19 NOTE — Evaluation (Addendum)
Speech Language Pathology Assessment and Plan ? ?Patient Details  ?Name: Gordon Fisher ?MRN: [id number] ?Date of Birth: 1960/12/30 ? ?SLP Diagnosis: Aphasia;Cognitive Impairments;Dysphagia  ?Rehab Potential: Good ?ELOS: 4 weeks  ? ?Today's Date: 02/19/2022 ?SLP Individual Time: 0800-0900 ?SLP Individual Time Calculation (min): 60 min ? ?Hospital Problem: Principal Problem: ?  Acute ischemic left middle cerebral artery (MCA) stroke (HCC) ? ?Past Medical History: History reviewed. No pertinent past medical history. ?Past Surgical History:  ?Past Surgical History:  ?Procedure Laterality Date  ? BUBBLE STUDY  02/15/2022  ? Procedure: BUBBLE STUDY;  Surgeon: Werner Lean, MD;  Location: [facility];  Service: Cardiovascular;;  ? IR CT HEAD LTD  02/11/2022  ? IR CT HEAD LTD  02/11/2022  ? IR CT HEAD LTD  02/11/2022  ? IR PERCUTANEOUS ART THROMBECTOMY/INFUSION INTRACRANIAL INC DIAG ANGIO  02/11/2022  ? IR PERCUTANEOUS ART THROMBECTOMY/INFUSION INTRACRANIAL INC DIAG ANGIO  02/11/2022  ? IR US GUIDE VASC ACCESS RIGHT  02/11/2022  ? IR US GUIDE VASC ACCESS RIGHT  02/11/2022  ? RADIOLOGY WITH ANESTHESIA N/A 02/11/2022  ? Procedure: IR WITH ANESTHESIA;  Surgeon: Radiologist, Medication, MD;  Location: [facility];  Service: Radiology;  Laterality: N/A;  ? RADIOLOGY WITH ANESTHESIA N/A 02/11/2022  ? Procedure: RADIOLOGY WITH ANESTHESIA;  Surgeon: Radiologist, Medication, MD;  Location: [facility];  Service: Radiology;  Laterality: N/A;  ? TEE WITHOUT CARDIOVERSION N/A 02/15/2022  ? Procedure: TRANSESOPHAGEAL ECHOCARDIOGRAM (TEE);  Surgeon: Werner Lean, MD;  Location: [facility];  Service: Cardiovascular;  Laterality: N/A;  ? ? ?Assessment / Plan / Recommendation ?Clinical Impression Gordon Fisher is a 61 year old male in relatively good health who was admitted via [facility] on 02/11/22 with right sided weakness, right facial droop and slurred speech. CTH showed L-MCA hyperdense sign and he received TNK prior to transfer. CTA  showed L-MCA MI occlusion and he underwent cerebral angio with thrombectomy and TICI 3 flow by Dr. Nelida Gores [name].  Follow up MRI brain done revealing small areas of acute infarct infarct in L-MCA territory with mild hemorrhagic transformation in left parietal lobe and L-MCA bifurcation residual vascular thrombus. He was taken back for thrombectomy with stent for dissection but had intra-stent occlusion which was unable to be recanalized. Post op CT showed small SAH which was stable on follow up. He was started on ASA/Brillinta. He was started on tube feeds for nutritional support and MBS 04/18 showed oral dysphagia with oral delay and no penetration or aspiration. He was started on D3, thins with supervision. Repeat MRI brain on 04/16 showed extensive evolving acute ischemic L-MCA occlusion increased in size with associated edema, few additional small acute cortical subcortical infarcts involving contralateral right frontal lobe and left ACA distribution. Stroke felt to be due to unknown etiology and wife elected on Zio patch on discharge to rule out A fib. 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 CIR recommended due to functional decline.  ? ?Patient presents with severe expressive and receptive aphasia (expressive > receptive). Verbal expression is currently limited to "yeah", limited approximations, and through multimodal means via head nod and facial expressions. Verbal output is substantially limited at this time. Pt unable to vocalize on command, object, name, verbally repeat, r complete automatic speech tasks. Responses to basic yes/no questions were 40% accurate in regards to biographical information; 25% accurate regarding immediate environment. Pt was stimulable for use of yes/no board and required mod A  verbal/visual prompting to use. Pt pointed to field of 2 written words with 40% accuracy -  some squinting observed; spouse plans to bring eye glasses. Pt also correctly responded to written cloze phrase by selecting written response with 50% accuracy. Pt pointed to field of 4 pictures on picture based communication board with 90% accuracy; field of 8 with 50% accuracy. Overall pt presents with strengths in comprehension and reading given limited field. Suspect pt will progress well with multimodal communication goals. SLP provided patient/family with handouts on aphasia, picture communication boards, and dry erase board. SLP also provided education throughout assessment re: aphasia, communication strategies, dysphagia, and safe swallowing precautions.  ? ?Oral mechanism exam remarkable for mild R facial weakness. Pt consumed thin liquids by cup and straw with delayed cough x1; mild anterior labial spillage with cup sips. Pt consumed pureed textures with mild anterior spillage; consumed dys 3 textures with functional mastication and no overt s/sx of aspiration. Pt required moderate fading to sup A verbal cues to reduce rate of consumption and bolus size d/t impulsivity with intake. Recommend continuation of dys 3 diet and thin liquids with full supervision. Spouse and sister-in-law cleared to provide support/sup during meals. Anticipate pt will be able to progress to reg diet/thin liquids during rehab stay.  ? ?Patient would benefit from skilled SLP intervention to maximize language functioning and swallowing and overall functional independence prior to discharge. Further cognitive assessment may be beneficial as language skills progress. Plan to focus on language and swallowing goals at this time. ?   ?Skilled Therapeutic Interventions          Pt participated in informal cognitive-linguistic, speech, and language function. Completed BSE. Please see above.    ?  ?SLP Assessment ? Patient will need skilled Speech Lanaguage Pathology Services during CIR admission  ?  ?Recommendations ? SLP Diet  Recommendations: Dysphagia 3 (Mech soft);Thin ?Liquid Administration via: Cup;Straw ?Medication Administration: Crushed with puree ?Supervision: Patient able to self feed;Full supervision/cueing for compensatory strategies;Trained caregiver to feed patient ?Compensations: Slow rate;Small sips/bites;Minimize environmental distractions ?Postural Changes and/or Swallow Maneuvers: Seated upright 90 degrees ?Oral Care Recommendations: Oral care BID ?Patient destination: Home ?Follow up Recommendations: 24 hour supervision/assistance;Outpatient SLP  ?Equipment recommended: None recommended by SLP ?  ?SLP Frequency 3 to 5 out of 7 days   ?SLP Duration ? ?SLP Intensity ? ?SLP Treatment/Interventions 4 weeks ? ?Minumum of 1-2 x/day, 30 to 90 minutes ? ?Cognitive remediation/compensation;Environmental controls;Multimodal communication approach;Speech/Language Firefighter;Dysphagia/aspiration precaution training;Functional tasks;Patient/family education;Therapeutic Activities   ? ?Pain ?Pain Assessment ?Pain Scale: 0-10 ?Pain Score: 0-No pain (does not c/o pain) ? ?Prior Functioning ?Cognitive/Linguistic Baseline: Within functional limits ?Type of Home: House ? Lives With: Spouse;Son ?Available Help at Discharge: Family;Available 24 hours/day ?Vocation: Full time employment ? ?SLP Evaluation ?Cognition ?Overall Cognitive Status: Impaired/Different from baseline ?Arousal/Alertness: Awake/alert ?Orientation Level: Oriented to person;Other (comment) ?Year:  (Difficult to assess 2/2 aphasia) ?Month:  (Difficult to assess 2/2 aphasia) ?Day of Week:  (Difficult to assess 2/2 aphasia) ?Attention: Sustained ?Focused Attention: Appears intact ?Sustained Attention: Impaired ?Sustained Attention Impairment: Verbal basic;Functional basic ?Memory:  (Difficult to assess 2/2 aphasia) ?Awareness: Impaired ?Awareness Impairment: Intellectual impairment ?Behaviors: Impulsive ?Safety/Judgment: Impaired ?Comments: Pt able to  approximate "blue" and "bed" when asked to repeat, was able to point to correct day/month/year in written field of 2.  ?Comprehension ?Auditory Comprehension ?Overall Auditory Comprehension: Impaired ?Yes/No Questions

## 2022-02-19 NOTE — Progress Notes (Signed)
Inpatient Rehabilitation Center ?Individual Statement of Services ? ?Patient Name:  Gordon Fisher  ?Date:  02/19/2022 ? ?Welcome to the [facility].  Our goal is to provide you with an individualized program based on your diagnosis and situation, designed to meet your specific needs.  With this comprehensive rehabilitation program, you will be expected to participate in at least 3 hours of rehabilitation therapies Monday-Friday, with modified therapy programming on the weekends. ? ?Your rehabilitation program will include the following services:  Physical Therapy (PT), Occupational Therapy (OT), Speech Therapy (ST), 24 hour per day rehabilitation nursing, Therapeutic Recreaction (TR), Neuropsychology, Care Coordinator, Rehabilitation Medicine, Nutrition Services, Pharmacy Services, and Other ? ?Weekly team conferences will be held on Wednesdays to discuss your progress.  Your Inpatient Rehabilitation Care Coordinator will talk with you frequently to get your input and to update you on team discussions.  Team conferences with you and your family in attendance may also be held. ? ?Expected length of stay:  21-24 Days  Overall anticipated outcome:  Min A ? ?Depending on your progress and recovery, your program may change. Your Inpatient Rehabilitation Care Coordinator will coordinate services and will keep you informed of any changes. Your Inpatient Rehabilitation Care Coordinator's name and contact numbers are listed  below. ? ?The following services may also be recommended but are not provided by the [facility]:  ? ?Home Health Rehabiltiation Services ?Outpatient Rehabilitation Services ? ?  ?Arrangements will be made to provide these services after discharge if needed.  Arrangements include referral to agencies that provide these services. ? ?Your insurance has been verified to be:   [employer] ?Your primary doctor is:  NO PCP ? ?Pertinent information will be shared with your  doctor and your insurance company. ? ?Inpatient Rehabilitation Care Coordinator:  Erlene Quan, [place] or [phone] ? ?Information discussed with and copy given to patient by: Dyanne Iha, 02/19/2022, 10:48 AM    ?

## 2022-02-19 NOTE — Plan of Care (Signed)
?  Problem: RH Comprehension Communication ?Goal: LTG Patient will comprehend basic/complex auditory (SLP) ?Description: LTG: Patient will comprehend basic/complex auditory information with cues (SLP). ?Flowsheets (Taken 02/19/2022 1735) ?LTG: Patient will comprehend: Basic auditory information ?LTG: Patient will comprehend auditory information with cueing (SLP): Minimal Assistance - Patient > 75% ?  ?Problem: RH Expression Communication ?Goal: LTG Patient will express needs/wants via multi-modal(SLP) ?Description: LTG:  Patient will express needs/wants via multi-modal communication (gestures/written, etc) with cues (SLP) ?Flowsheets (Taken 02/19/2022 1735) ?LTG: Patient will express needs/wants via multimodal communication (gestures/written, etc) with cueing (SLP): ? Moderate Assistance - Patient 50 - 74% ? Maximal Assistance - Patient 25 - 49% ?  ?Problem: RH Swallowing ?Goal: LTG Patient will consume least restrictive diet using compensatory strategies with assistance (SLP) ?Description: LTG:  Patient will consume least restrictive diet using compensatory strategies with assistance (SLP) ?Flowsheets (Taken 02/19/2022 1736) ?LTG: Pt Patient will consume least restrictive diet using compensatory strategies with assistance of (SLP): Supervision ?  ?

## 2022-02-19 NOTE — Progress Notes (Signed)
Inpatient Rehabilitation  Patient information reviewed and entered into eRehab system by Chevez Sambrano M. Caleah Tortorelli, M.A., CCC/SLP, PPS Coordinator.  Information including medical coding, functional ability and quality indicators will be reviewed and updated through discharge.    

## 2022-02-19 NOTE — Plan of Care (Signed)
?Problem: RH Balance ?Goal: LTG: Patient will maintain dynamic sitting balance (OT) ?Description: LTG:  Patient will maintain dynamic sitting balance with assistance during activities of daily living (OT) ?Flowsheets (Taken 02/19/2022 1501) ?LTG: Pt will maintain dynamic sitting balance during ADLs with: Supervision/Verbal cueing ?Goal: LTG Patient will maintain dynamic standing with ADLs (OT) ?Description: LTG:  Patient will maintain dynamic standing balance with assist during activities of daily living (OT)  ?Flowsheets (Taken 02/19/2022 1501) ?LTG: Pt will maintain dynamic standing balance during ADLs with: Minimal Assistance - Patient > 75% ?  ?Problem: Sit to Stand ?Goal: LTG:  Patient will perform sit to stand in prep for activites of daily living with assistance level (OT) ?Description: LTG:  Patient will perform sit to stand in prep for activites of daily living with assistance level (OT) ?Flowsheets (Taken 02/19/2022 1501) ?LTG: PT will perform sit to stand in prep for activites of daily living with assistance level: Contact Guard/Touching assist ?  ?Problem: RH Eating ?Goal: LTG Patient will perform eating w/assist, cues/equip (OT) ?Description: LTG: Patient will perform eating with assist, with/without cues using equipment (OT) ?Flowsheets (Taken 02/19/2022 1501) ?LTG: Pt will perform eating with assistance level of: Supervision/Verbal cueing ?  ?Problem: RH Grooming ?Goal: LTG Patient will perform grooming w/assist,cues/equip (OT) ?Description: LTG: Patient will perform grooming with assist, with/without cues using equipment (OT) ?Flowsheets (Taken 02/19/2022 1501) ?LTG: Pt will perform grooming with assistance level of: Supervision/Verbal cueing ?  ?Problem: RH Bathing ?Goal: LTG Patient will bathe all body parts with assist levels (OT) ?Description: LTG: Patient will bathe all body parts with assist levels (OT) ?Flowsheets (Taken 02/19/2022 1501) ?LTG: Pt will perform bathing with assistance level/cueing:  Minimal Assistance - Patient > 75% ?  ?Problem: RH Dressing ?Goal: LTG Patient will perform upper body dressing (OT) ?Description: LTG Patient will perform upper body dressing with assist, with/without cues (OT). ?Flowsheets (Taken 02/19/2022 1501) ?LTG: Pt will perform upper body dressing with assistance level of: Minimal Assistance - Patient > 75% ?Goal: LTG Patient will perform lower body dressing w/assist (OT) ?Description: LTG: Patient will perform lower body dressing with assist, with/without cues in positioning using equipment (OT) ?Flowsheets (Taken 02/19/2022 1501) ?LTG: Pt will perform lower body dressing with assistance level of: Minimal Assistance - Patient > 75% ?  ?Problem: RH Toileting ?Goal: LTG Patient will perform toileting task (3/3 steps) with assistance level (OT) ?Description: LTG: Patient will perform toileting task (3/3 steps) with assistance level (OT)  ?Flowsheets (Taken 02/19/2022 1501) ?LTG: Pt will perform toileting task (3/3 steps) with assistance level: Minimal Assistance - Patient > 75% ?  ?Problem: RH Functional Use of Upper Extremity ?Goal: LTG Patient will use RT/LT upper extremity as a (OT) ?Description: LTG: Patient will use right/left upper extremity as a stabilizer/gross assist/diminished/nondominant/dominant level with assist, with/without cues during functional activity (OT) ?Flowsheets (Taken 02/19/2022 1501) ?LTG: Use of upper extremity in functional activities: RUE as a stabilizer ?LTG: Pt will use upper extremity in functional activity with assistance level of: Maximal Assistance - Patient 25 - 49% ?  ?Problem: RH Toilet Transfers ?Goal: LTG Patient will perform toilet transfers w/assist (OT) ?Description: LTG: Patient will perform toilet transfers with assist, with/without cues using equipment (OT) ?Flowsheets (Taken 02/19/2022 1501) ?LTG: Pt will perform toilet transfers with assistance level of: Minimal Assistance - Patient > 75% ?  ?Problem: RH Tub/Shower Transfers ?Goal:  LTG Patient will perform tub/shower transfers w/assist (OT) ?Description: LTG: Patient will perform tub/shower transfers with assist, with/without cues using equipment (OT) ?Flowsheets (  Taken 02/19/2022 1501) ?LTG: Pt will perform tub/shower stall transfers with assistance level of: Minimal Assistance - Patient > 75% ?  ?Problem: RH Attention ?Goal: LTG Patient will demonstrate this level of attention during functional activites (OT) ?Description: LTG:  Patient will demonstrate this level of attention during functional activites  (OT) ?Flowsheets (Taken 02/19/2022 1501) ?Patient will demonstrate this level of attention during functional activites: Selective ?Patient will demonstrate above attention level in the following environment: Controlled ?LTG: Patient will demonstrate this level of attention during functional activites (OT): Minimal Assistance - Patient > 75% ?  ?

## 2022-02-19 NOTE — IPOC Note (Addendum)
Overall Plan of Care (IPOC) ?Patient Details ?Name: Gordon Fisher ?MRN: [id number] ?DOB: July 29, 1961 ? ?Admitting Diagnosis: Acute ischemic left middle cerebral artery (MCA) stroke (HCC) ? ?Hospital Problems: Principal Problem: ?  Acute ischemic left middle cerebral artery (MCA) stroke (HCC) ? ? ? ? Functional Problem List: ?Nursing Bowel, Nutrition, Medication Management, Endurance, Safety  ?PT Balance, Safety, Endurance, Sensory, Motor  ?OT Balance, Behavior, Cognition, Endurance, Motor, Perception, Safety, Vision  ?SLP Behavior, Cognition, Safety, Nutrition  ?TR    ?    ? Basic ADL?s: ?OT Eating, Grooming, Bathing, Dressing, Toileting  ? ?  Advanced  ADL?s: ?OT    ?   ?Transfers: ?PT Bed Mobility, Bed to Chair, Car, Furniture  ?OT Toilet, Tub/Shower  ? ?  Locomotion: ?PT Ambulation, Stairs, Wheelchair Mobility  ? ?  Additional Impairments: ?OT Fuctional Use of Upper Extremity  ?SLP Swallowing, Communication, Social Cognition ?expression, comprehension ?Awareness, Attention  ?TR    ? ? ?Anticipated Outcomes ?Item Anticipated Outcome  ?Self Feeding S  ?Swallowing ? sup ?  ?Basic self-care ? S to min A  ?Toileting ? min A ?  ?Bathroom Transfers min A  ?Bowel/Bladder ? manage bowel w mod I assist  ?Transfers ? CGA.  ?Locomotion ? Min A w/ appropriate AD  ?Communication ? min A comprehension, mod A expression via multmiodal means  ?Cognition ?    ?Pain ? pain at or below level 4 with prns  ?Safety/Judgment ? manage safety w cues  ? ?Therapy Plan: ?PT Intensity: Minimum of 1-2 x/day ,45 to 90 minutes ?PT Frequency: 5 out of 7 days ?PT Duration Estimated Length of Stay: 4 weeks. ?OT Intensity: Minimum of 1-2 x/day, 45 to 90 minutes ?OT Frequency: 5 out of 7 days ?OT Duration/Estimated Length of Stay: 4-4.5 weeks ?SLP Intensity: Minumum of 1-2 x/day, 30 to 90 minutes ?SLP Frequency: 3 to 5 out of 7 days ?SLP Duration/Estimated Length of Stay: 4 weeks  ? ?Due to the current state of emergency, patients may not be receiving  their 3-hours of Medicare-mandated therapy. ? ? Team Interventions: ?Nursing Interventions Disease Management/Prevention, Medication Management, Discharge Planning, Patient/Family Education, Pain Management, Bowel Management  ?PT interventions Ambulation/gait training, Discharge planning, DME/adaptive equipment instruction, Functional mobility training, Therapeutic Activities, UE/LE Strength taining/ROM, Visual/perceptual remediation/compensation, Training and development officer, Community reintegration, Neuromuscular re-education, Patient/family education, Stair training, Therapeutic Exercise, UE/LE Coordination activities, Wheelchair propulsion/positioning  ?OT Interventions Balance/vestibular training, Disease mangement/prevention, Neuromuscular re-education, Self Care/advanced ADL retraining, Therapeutic Exercise, Wheelchair propulsion/positioning, UE/LE Strength taining/ROM, Skin care/wound managment, Pain management, DME/adaptive equipment instruction, Cognitive remediation/compensation, Community reintegration, Functional electrical stimulation, Patient/family education, Splinting/orthotics, UE/LE Coordination activities, Visual/perceptual remediation/compensation, Therapeutic Activities, Functional mobility training, Psychosocial support, Discharge planning  ?SLP Interventions Cognitive remediation/compensation, Environmental controls, Multimodal communication approach, Speech/Language facilitation, Cueing hierarchy, Dysphagia/aspiration precaution training, Functional tasks, Patient/family education, Therapeutic Activities  ?TR Interventions    ?SW/CM Interventions Discharge Planning, Psychosocial Support, Patient/Family Education, Disease Management/Prevention  ? ?Barriers to Discharge ?MD  Medical stability and Nutritional means  ?Nursing Decreased caregiver support, Home environment access/layout ?2 level 5+ ste, bil rails with wife; main B+B  ?PT Inaccessible home environment ?> 6 STE  ?OT Decreased  caregiver support, Incontinence, Inaccessible home environment, Home environment access/layout ?   ?SLP  Decreased caregiver support ?   ?SW IV antibiotics, Insurance for SNF coverage, Lack of/limited family support ?   ? ?Team Discharge Planning: ?Destination: PT-Home ,OT- Home , SLP-Home ?Projected Follow-up: PT-Other (comment) (HHPT vs outpt PT), OT-  Home health OT, 24 hour supervision/assistance,  SLP-24 hour supervision/assistance, Outpatient SLP ?Projected Equipment Needs: PT- , OT- To be determined, SLP- None recommended by SLP ?Equipment Details: PT- , OT-  ?Patient/family involved in discharge planning: PT- Family member/caregiver,  OT-Patient, Family member/caregiver, SLP-Patient, Family member/caregiver ? ?MD ELOS: 21-23d ?Medical Rehab Prognosis:  Good ?Assessment: The patient has been admitted for CIR therapies with the diagnosis of Left MCA infarct. The team will be addressing functional mobility, strength, stamina, balance, safety, adaptive techniques and equipment, self-care, bowel and bladder mgt, patient and caregiver education, tube feeds. Goals have been set at min/mod A. Anticipated discharge destination is Home . ? ?Due to the current state of emergency, patients may not be receiving their 3 hours per day of Medicare-mandated therapy.  ? ?  ? ? ?See Team Conference Notes for weekly updates to the plan of care  ?

## 2022-02-19 NOTE — Evaluation (Signed)
Occupational Therapy Assessment and Plan ? ?Patient Details  ?Name: Gordon Fisher ?MRN: [id number] ?Date of Birth: 27-May-1961 ? ?OT Diagnosis: abnormal posture, apraxia, cognitive deficits, disturbance of vision, flaccid hemiplegia and hemiparesis, hemiplegia affecting dominant side, and muscle weakness (generalized) ?Rehab Potential: Rehab Potential (ACUTE ONLY): Good ?ELOS: 4-4.5 weeks  ? ?Today's Date: 02/19/2022 ?OT Individual Time: 0950-1100 ?OT Individual Time Calculation (min): 70 min    ? ?Hospital Problem: Principal Problem: ?  Acute ischemic left middle cerebral artery (MCA) stroke (HCC) ? ? ?Past Medical History: History reviewed. No pertinent past medical history. ?Past Surgical History:  ?Past Surgical History:  ?Procedure Laterality Date  ? BUBBLE STUDY  02/15/2022  ? Procedure: BUBBLE STUDY;  Surgeon: Werner Lean, MD;  Location: [facility];  Service: Cardiovascular;;  ? IR CT HEAD LTD  02/11/2022  ? IR CT HEAD LTD  02/11/2022  ? IR CT HEAD LTD  02/11/2022  ? IR PERCUTANEOUS ART THROMBECTOMY/INFUSION INTRACRANIAL INC DIAG ANGIO  02/11/2022  ? IR PERCUTANEOUS ART THROMBECTOMY/INFUSION INTRACRANIAL INC DIAG ANGIO  02/11/2022  ? IR US GUIDE VASC ACCESS RIGHT  02/11/2022  ? IR US GUIDE VASC ACCESS RIGHT  02/11/2022  ? RADIOLOGY WITH ANESTHESIA N/A 02/11/2022  ? Procedure: IR WITH ANESTHESIA;  Surgeon: Radiologist, Medication, MD;  Location: [facility];  Service: Radiology;  Laterality: N/A;  ? RADIOLOGY WITH ANESTHESIA N/A 02/11/2022  ? Procedure: RADIOLOGY WITH ANESTHESIA;  Surgeon: Radiologist, Medication, MD;  Location: [facility];  Service: Radiology;  Laterality: N/A;  ? TEE WITHOUT CARDIOVERSION N/A 02/15/2022  ? Procedure: TRANSESOPHAGEAL ECHOCARDIOGRAM (TEE);  Surgeon: Werner Lean, MD;  Location: [facility];  Service: Cardiovascular;  Laterality: N/A;  ? ? ?Assessment & Plan ?Clinical Impression: Patient is a 61 y.o. year old male relatively good health who was admitted via [facility] on 02/11/22  with right sided weakness, right facial droop and slurred speech. CTH showed L-MCA hyperdense sign and he received TNK prior to transfer. CTA showed L-MCA MI occlusion and he underwent cerebral angio with thrombectomy and TICI 3 flow by Dr. Nelida Gores [name].  Follow up MRI brain done revealing small areas of acute infarct infarct in L-MCA territory with mild hemorrhagic transformation in left parietal lobe and L-MCA bifurcation residual vascular thrombus. He was taken back for thrombectomy with stent for dissection but had intra-stent occlusion which was unable to be recanalized. Post op CT showed small SAH which was stable on follow up. He was started on ASA/Brillinta. He was started on tube feeds for nutritional support and MBS 04/18 showed oral dysphagia with oral delay and no penetration or aspiration. He was started on D3, thins with supervision.  ?  ?TEE showed EF 60-65%, was negative for thrombus, PFO and showed grade II plaque in descending aorta.  Repeat MRI brain on 04/16 showed extensive evolving acute ischemic L-MCA occlusion increased in size with associated edema, few additional small acute cortical subcortical infarcts involving contralateral right frontal lobe and left ACA distribution. Stroke felt to be due to unknown etiology and wife elected on Zio patch on discharge to rule out A fib. 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 CIR recommended due to functional decline.  ? .  Patient transferred to CIR on 02/18/2022 .   ? ?Patient currently requires  mod to total A of 2  with basic self-care skills secondary to muscle weakness, decreased cardiorespiratoy endurance, impaired timing and sequencing, abnormal tone, unbalanced muscle  activation, decreased coordination, and decreased motor planning, decreased visual acuity, decreased attention to right, decreased motor planning, and ideational  apraxia, decreased initiation, decreased attention, decreased awareness, decreased problem solving, decreased safety awareness, decreased memory, and delayed processing, and decreased sitting balance, decreased standing balance, decreased postural control, hemiplegia, and decreased balance strategies.  Prior to hospitalization, patient could complete BADL/IADL/mobility with independent . ? ?Patient will benefit from skilled intervention to decrease level of assist with basic self-care skills and increase independence with basic self-care skills prior to discharge home with care partner.  Anticipate patient will require 24 hour supervision and follow up home health. ? ?OT - End of Session ?Activity Tolerance: Tolerates 10 - 20 min activity with multiple rests ?Endurance Deficit: Yes ?Endurance Deficit Description: endorses fatigue/dizziness with ADL ?OT Assessment ?Rehab Potential (ACUTE ONLY): Good ?OT Barriers to Discharge: Decreased caregiver support;Incontinence;Inaccessible home environment;Home environment access/layout ?OT Patient demonstrates impairments in the following area(s): Balance;Behavior;Cognition;Endurance;Motor;Perception;Safety;Vision ?OT Basic ADL's Functional Problem(s): Eating;Grooming;Bathing;Dressing;Toileting ?OT Transfers Functional Problem(s): Toilet;Tub/Shower ?OT Additional Impairment(s): Fuctional Use of Upper Extremity ?OT Plan ?OT Intensity: Minimum of 1-2 x/day, 45 to 90 minutes ?OT Frequency: 5 out of 7 days ?OT Duration/Estimated Length of Stay: 4-4.5 weeks ?OT Treatment/Interventions: Balance/vestibular training;Disease mangement/prevention;Neuromuscular re-education;Self Care/advanced ADL retraining;Therapeutic Exercise;Wheelchair propulsion/positioning;UE/LE Strength taining/ROM;Skin care/wound managment;Pain management;DME/adaptive equipment instruction;Cognitive remediation/compensation;Community reintegration;Functional electrical stimulation;Patient/family  education;Splinting/orthotics;UE/LE Coordination activities;Visual/perceptual remediation/compensation;Therapeutic Activities;Functional mobility training;Psychosocial support;Discharge planning ?OT Self Feeding Anticipated Outcome(s): S ?OT Basic Self-Care Anticipated Outcome(s): S to min A ?OT Toileting Anticipated Outcome(s): min A ?OT Bathroom Transfers Anticipated Outcome(s): min A ?OT Recommendation ?Patient destination: Home ?Follow Up Recommendations: Home health OT;24 hour supervision/assistance ?Equipment Recommended: To be determined ? ? ?OT Evaluation ?Precautions/Restrictions  ?Precautions ?Precautions: Fall;Other (comment) ?Precaution Comments: R hemi, global aphasia, cortrak ?Restrictions ?Weight Bearing Restrictions: No ?General ?Chart Reviewed: Yes ?Response to Previous Treatment: Patient with no complaints from previous session ?Family/Caregiver Present: Yes (wife and sister in law) ?Pain ?Pain Assessment ?Pain Scale: Faces ?Pain Score: 0-No pain ?Home Living/Prior Functioning ?Home Living ?Family/patient expects to be discharged to:: Private residence ?Living Arrangements: Spouse/significant other ?Vision ?Baseline Vision/History: 1 Wears glasses (readers - not in hospital) ?Ability to See in Adequate Light: 0 Adequate (difficulty fully assessing due to global aphasia) ?Patient Visual Report: Blurring of vision ?Vision Assessment?: Yes ?Eye Alignment: Within Functional Limits ?Alignment/Gaze Preference: Head tilt;Gaze left ?Tracking/Visual Pursuits: Able to track stimulus in all quads without difficulty ?Saccades: Decreased speed of saccadic movement ?Convergence: Within functional limits ?Visual Fields: Right visual field deficit (R inattention, but able to scan to look at therapist on R) ?Perception  ?Perception: Impaired ?Inattention/Neglect: Does not attend to right side of body;Does not attend to right visual field ?Praxis ?Praxis: Impaired ?Praxis Impairment Details: Perseveration;Motor  planning;Initiation ?Praxis-Other Comments: perseverative on sounds when attempting to speak ?Cognition ?Cognition ?Overall Cognitive Status: Impaired/Different from baseline ?Arousal/Alertness: Awake/alert ?Orientation Level:

## 2022-02-20 LAB — GLUCOSE, CAPILLARY
Glucose-Capillary: 143 mg/dL — ABNORMAL HIGH (ref 70–99)
Glucose-Capillary: 119 mg/dL — ABNORMAL HIGH (ref 70–99)
Glucose-Capillary: 164 mg/dL — ABNORMAL HIGH (ref 70–99)
Glucose-Capillary: 223 mg/dL — ABNORMAL HIGH (ref 70–99)

## 2022-02-20 MED ORDER — CAMPHOR-MENTHOL 0.5-0.5 % EX LOTN
TOPICAL_LOTION | CUTANEOUS | Status: DC | PRN
  Filled 2022-02-20 (×2): qty 222

## 2022-02-20 MED ORDER — HYDROCORTISONE 1 % EX CREA
TOPICAL_CREAM | CUTANEOUS | Status: DC | PRN
  Filled 2022-02-20: qty 28

## 2022-02-20 MED ORDER — PROSOURCE PLUS PO LIQD: 30.0000 mL | Freq: Two times a day (BID) | ORAL | Status: DC

## 2022-02-20 MED ORDER — PROSOURCE PLUS PO LIQD
90.0000 mL | Freq: Two times a day (BID) | ORAL | Status: DC
  Administered 2022-02-20 – 2022-03-07 (×26): 90 mL via ORAL
  Filled 2022-02-20 (×20): qty 90

## 2022-02-20 NOTE — Plan of Care (Signed)
?  Problem: Consults ?Goal: RH STROKE PATIENT EDUCATION ?Description: See Patient Education module for education specifics  ?Outcome: Progressing ?  ?Problem: RH BOWEL ELIMINATION ?Goal: RH STG MANAGE BOWEL WITH ASSISTANCE ?Description: STG Manage Bowel with mod I Assistance. ?Outcome: Progressing ?Goal: RH STG MANAGE BOWEL W/MEDICATION W/ASSISTANCE ?Description: STG Manage Bowel with Medication with mod I Assistance. ?Outcome: Progressing ?  ?Problem: RH SAFETY ?Goal: RH STG ADHERE TO SAFETY PRECAUTIONS W/ASSISTANCE/DEVICE ?Description: STG Adhere to Safety Precautions With cues Assistance/Device. ?Outcome: Progressing ?  ?Problem: RH PAIN MANAGEMENT ?Goal: RH STG PAIN MANAGED AT OR BELOW PT'S PAIN GOAL ?Description: At or below level 4 with prns ?Outcome: Progressing ?  ?Problem: RH KNOWLEDGE DEFICIT ?Goal: RH STG INCREASE KNOWLEDGE OF DIABETES ?Description: Patient and spouse will be able to manage prediabetes with dietary modifications using handouts and educational materials independently ?Outcome: Progressing ?Goal: RH STG INCREASE KNOWLEDGE OF DYSPHAGIA/FLUID INTAKE ?Description: Patient and spouse will be able to manage dysphagia, medications and dietary modifications using handouts and educational materials independently ?Outcome: Progressing ?Goal: RH STG INCREASE KNOWLEGDE OF HYPERLIPIDEMIA ?Description: Patient and spouse will be able to manage HLD with medications and dietary modifications using handouts and educational materials independently ?Outcome: Progressing ?Goal: RH STG INCREASE KNOWLEDGE OF STROKE PROPHYLAXIS ?Description: Patient and spouse will be able to manage secondary stroke risks with medications and dietary modifications using handouts and educational materials independently ?Outcome: Progressing ?  ?

## 2022-02-20 NOTE — Progress Notes (Signed)
?                                                       PROGRESS NOTE ? ? ?Subjective/Complaints: ? ?Pt reports no issues- saying yes to every question.  ?Per staff, no issues except a rash that's getting worse on chest/arms- ?Changed to hypoallergenic sheets- will add Sarna and cortisone as well as benadryl elixir prn.  ? ?ROS-  ?Limited by aphasia.  ? ?Objective: ?  ?No results found. ?Recent Labs  ?  02/19/22 ?0527  ?WBC 8.7  ?HGB 15.8  ?HCT 45.3  ?PLT 186  ? ?Recent Labs  ?  02/19/22 ?0527  ?NA 135  ?K 4.1  ?CL 103  ?CO2 23  ?GLUCOSE 126*  ?BUN 24*  ?CREATININE 1.11  ?CALCIUM 8.8*  ? ? ?Intake/Output Summary (Last 24 hours) at 02/20/2022 1558 ?Last data filed at 02/20/2022 0500 ?Gross per 24 hour  ?Intake 296 ml  ?Output 200 ml  ?Net 96 ml  ?  ? ?  ? ?Physical Exam: ?Vital Signs ?Blood pressure 127/86, pulse 93, temperature 98.7 ?F (37.1 ?C), temperature source Oral, resp. rate 17, height 5\' 8"  (1.727 m), weight 106 kg, SpO2 94 %. ? ? ?General: awake, alert, answers yes 100% of timeNAD ?HENT: conjugate gaze; oropharynx moist ?CV: regular rate; no JVD ?Pulmonary: CTA B/L; no W/R/R- good air movement ?GI: soft, NT, ND, (+)BS ?Psychiatric: bright affect ?Neurological: receptive/expressive aphasia- only answers yes to all questions.  ?Extremities: No clubbing, cyanosis, or edema ?Skin: No evidence of breakdown, erythematous rash on chest/arms- pt scratching ?Neurologic: Cranial nerves II through XII intact, motor strength is 5/5 in left and 0/5 RIght deltoid, bicep, tricep, grip, hip flexor, knee extensors, ankle dorsiflexor and plantar flexor ?Trace Right hip add ?Exp> recept aphasia , answers yes to all questions  ?Musculoskeletal: Full range of motion in all 4 extremities. No joint swelling ? ? ? ?Assessment/Plan: ?1. Functional deficits which require 3+ hours per day of interdisciplinary therapy in a comprehensive inpatient rehab setting. ?Physiatrist is providing close team supervision and 24 hour management of  active medical problems listed below. ?Physiatrist and rehab team continue to assess barriers to discharge/monitor patient progress toward functional and medical goals ? ?Care Tool: ? ?Bathing ?   ?Body parts bathed by patient: Right arm, Chest, Abdomen, Front perineal area, Buttocks, Face, Right upper leg, Left upper leg  ? Body parts bathed by helper: Left arm ?  ?  ?Bathing assist Assist Level: 2 Helpers (MOD A +2) ?  ?  ?Upper Body Dressing/Undressing ?Upper body dressing   ?What is the patient wearing?: Pull over shirt ?   ?Upper body assist Assist Level: Moderate Assistance - Patient 50 - 74% ?   ?Lower Body Dressing/Undressing ?Lower body dressing ? ? ?   ?What is the patient wearing?: Pants, Incontinence brief ? ?  ? ?Lower body assist Assist for lower body dressing: 2 Helpers (MOD A +2) ?   ? ?Toileting ?Toileting Toileting Activity did not occur (Probation officer and hygiene only): N/A (no void or bm)  ?Toileting assist   ?  ?  ?Transfers ?Chair/bed transfer ? ?Transfers assist ?   ? ?Chair/bed transfer assist level: Moderate Assistance - Patient 50 - 74% (squat pivot to L) ?  ?  ?Locomotion ?Ambulation ? ? ?Ambulation assist ? ?  Ambulation activity did not occur: Safety/medical concerns ? ?Assist level: 2 helpers (max A of 1 and +2 w/c follow) ?Assistive device: Other (comment) (L hallway rail) ?Max distance: 32ft  ? ?Walk 10 feet activity ? ? ?Assist ? Walk 10 feet activity did not occur: Safety/medical concerns ? ?  ?   ? ?Walk 50 feet activity ? ? ?Assist Walk 50 feet with 2 turns activity did not occur: Safety/medical concerns ? ?  ?   ? ? ?Walk 150 feet activity ? ? ?Assist Walk 150 feet activity did not occur: Safety/medical concerns ? ?  ?  ?  ? ?Walk 10 feet on uneven surface  ?activity ? ? ?Assist Walk 10 feet on uneven surfaces activity did not occur: Safety/medical concerns ? ? ?  ?   ? ?Wheelchair ? ? ? ? ?Assist Is the patient using a wheelchair?: No ?  ?Wheelchair activity did not occur:  Safety/medical concerns ? ?  ?   ? ? ?Wheelchair 50 feet with 2 turns activity ? ? ? ?Assist ? ?  ?Wheelchair 50 feet with 2 turns activity did not occur: Safety/medical concerns ? ? ?   ? ?Wheelchair 150 feet activity  ? ? ? ?Assist ? Wheelchair 150 feet activity did not occur: Safety/medical concerns ? ? ?   ? ?Blood pressure 127/86, pulse 93, temperature 98.7 ?F (37.1 ?C), temperature source Oral, resp. rate 17, height 5\' 8"  (1.727 m), weight 106 kg, SpO2 94 %. ? ?Medical Problem List and Plan: ?1. 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            -patient may not shower ?            -ELOS/Goals: 21-23 days ?            Continue CIR- PT, OT and SLP-  ?2.  Antithrombotics: ?-DVT/anticoagulation:  Pharmaceutical: Lovenox added as 5 days out from [facility] ?            -antiplatelet therapy:  on ASA/Brillinta ?3. Pain Management: Tylenol prn.  ?4. Mood: LCSW to follow for evaluation and support when appropriate.  ?            -antipsychotic agents: N/A ?5. Neuropsych: This patient is not capable of making decisions on his own behalf. ?6. Skin/Wound Care: Routine pressure relief measures.  ?7. Fluids/Electrolytes/Nutrition: Will monitor I/O. CMET ok except low alb  ?8. Hyponatremia: Stable. Monitor with serial checks.  ?9. Prediabetes: Hgb A1C-6.0. BS poorly controlled due to continuous tube feeds.  ?--Add CM restrictions to diet and have dietician educate pt/wife ?--Monitor BS ac/hs for now. ?10. Dysphagia: D3 thin liq Will change tube feeds to 10 hours from 6 pm to 4 am to help stimulate appetite.  Hope to d/c NG tube on Monday or tues ?            --Start calorie count X 72 hours in am. ?11. Leucocytosis: resolved 4/21 ?         ? ?  Latest Ref Rng & Units 02/19/2022  ?  5:27 AM 02/17/2022  ?  3:00 AM 02/16/2022  ?  2:00 AM  ?CBC  ?WBC 4.0 - 10.5 K/uL 8.7   10.6   9.7    ?Hemoglobin 13.0 - 17.0 g/dL 15.8   16.1   16.5    ?Hematocrit 39.0 - 52.0 % 45.3   45.2   45.6    ?Platelets 150 - 400 K/uL 186   196  195    ?   ?12. Elevated LDL: continue Lipitor. ?13.  AKI: Encourage fluid intake. Repeat creatinine tomorrow.  ? 4/22- will check BMP in AM to make sure drinking.  ?14. Hypocalcemia:  Ionized Ca- 1.11 with normal albumin-4.1.  ?--Add calcium supplement bid.  ?15. Elevated Homocysteine levels:  B12, folate WNL--discussed with Dr. Erlinda Hong who felt no treatment needed as less than 50.  ?16. Obesity: BMI 35.53- provide dietary education. ? 17. Rash- chest and arms ? 4/22- will start Sarna lotion, cortisone cream and hypoallergenic sheets as well as benadryl 12.5 mg elixir prn.  ? ? ?I spent a total of 35   minutes on total care today- >50% coordination of care- due to d/w NP about rash and staff how to treat.  ? ? ?LOS: ?2 days ?A FACE TO FACE EVALUATION WAS PERFORMED ? ?Leeah Politano ?02/20/2022, 3:58 PM  ? ? ? ?

## 2022-02-20 NOTE — Progress Notes (Signed)
Occupational Therapy Session Note ? ?Patient Details  ?Name: Gordon Fisher ?MRN: [id number] ?Date of Birth: Apr 29, 1961 ? ?Today's Date: 02/20/2022 ?OT Individual Time: [id number] ?OT Individual Time Calculation (min): 75 min  ? ? ?Short Term Goals: ?Week 1:  OT Short Term Goal 1 (Week 1): Pt will maintain static sitting balance EOB with min A >5 min. ?OT Short Term Goal 2 (Week 1): Pt will complete toilet transfer with max A of 2 and no lift. ?OT Short Term Goal 3 (Week 1): Pt will don shirt in support sitting with min A. ?OT Short Term Goal 4 (Week 1): Pt will position RUE on pillow with max A. ? ?Skilled Therapeutic Interventions/Progress Updates:  ?Pt greeted supine in bed reporting "yes" when asked if pt  agreeable to OT intervention. Session focus on BADL reeducation, functional mobility, dynamic standing balance, RUE coordination/AAROM and decreasing overall caregiver burden.      ?Pt completed supine>sit to R side with MOD A+2. Pt completed EOB bathing with overall MOD A +2. Pt able to wash UB from EOB with MIN A for sitting balance with pt also able to stand to stedy with MOD A +2 while pt washed anterior periarea with MOD A for dynamic standing balance. Pt completed UB dressing with MIN A from EOB and MOD A +2 for LB dressing via sit>stand. Pt able to sit>stand with MOD A +2 with face to face assist. Pt completed squat pivot to L side with MOD A to TIS. Pt completed seated grooming tasks at sink with overall MODA with hand over hand assist for forced use of RUE during oral care.  ?Wife present during session. Pt general education provided on stroke recovery and PROM techniques as well as active assist therex.         ?Pt completed seated AAROM therex with wash cloth on side table including horizontal shoulder ADD/ABD, shoulder flexion/extension, circumduction to R and L.  Worked on opening ADL items such as lotion with pt holding lotion in R hand with hand over hand assist from OTA with L hand completing  precision task, provided simple commands such as "open/close" to facilitate improved carryover with language. Pt able to apply lotion to R arm, improved attention to R side after therex completed.  ?Pt inconsistently stating yes/no during session   ? pt left up in TIS with alarm belt activated and wife present.                     ? ? ?Therapy Documentation ?Precautions:  ?Precautions ?Precautions: Fall, Other (comment) ?Precaution Comments: R hemi, global aphasia, cortrak ?Restrictions ?Weight Bearing Restrictions: No ? ?Pain: no pain noted during session  ? ? ? ?Therapy/Group: Individual Therapy ? ?[name] ?02/20/2022, 12:17 PM ?

## 2022-02-20 NOTE — Progress Notes (Signed)
Physical Therapy Session Note ? ?Patient Details  ?Name: Gordon Fisher ?MRN: [id number] ?Date of Birth: [birth year] ? ?Today's Date: 02/20/2022 ?PT Individual Time: [id number] ?PT Individual Time Calculation (min): 57 min  ? ?Short Term Goals: ?Week 1:  PT Short Term Goal 1 (Week 1): Pt will transfer sup <> sit w/ mod A ?PT Short Term Goal 2 (Week 1): Pt will transfer sit to stand w/ Max A from lower surface. ?PT Short Term Goal 3 (Week 1): Pt will perform SPT w/ max A ? ?Skilled Therapeutic Interventions/Progress Updates:  ?  Pt received supine in bed with his wife, Gordon Fisher, present. Pt noticed to have a rash along his R arm and abdomen - pt's wife reports nurse is aware and they are planning to provide him hypoallergenic sheets. Pt with global aphasia (expressive>receptive) primarily stating "yeah" in response to all questions - pt more consistent with yes/no pictures or attempts at thumbs up/down. Pt appears agreeable to therapy session. Supine>sitting R EOB, HOB slightly elevated and relying on bedrail with LUE support, with light mod assist for trunk upright and R hemibody management. Sitting EOB with close supervision to min assist for trunk control while donning pants and shoes total assist. Sit>stand from EOB to +2 mod assist for L UE support and therapist blocking R knee buckle (though pt does demo some quad activation) - pulled pants up over hips total assist. LEFT squat/partial stand pivot transfer EOB>w/c with mod assist of 1, blocking R knee buckle and maintaining balance.  Transported to/from gym in w/c for time management and energy conservation. ? ?Therapist adjusted TIS headrest for improved cervical alignment and midline head support - may require swapping out to a different headrest entirely if this headrest continues to move. ? ?Donned R LE ankle DF assist ACE wrap and leg lifter to facilitate swing phase gait mechanics. ? ?Sit>stand at L hallway rail with light mod assist of 1 for lifting/balance with  blocking/guarding R knee - does well to power into standing - therapist facilitating proper R LE alignment prior to initiating transfer. ? ?Gait training 63ft x4 at L hallway rail with max assist of 1 and +2 w/c follow using mirror feedback with the following cuing and facilitation:  ?- total assist to advance R LE during swing with some possible extensor tone noted vs impaired motor planning with pt activating quads by mistake due to aphasia with possible apraxia ?- max/total assist to block R knee buckle during stance ?- pt with strong R lean the first walk that improved with subsequent trials  ?- started with step-to pattern leading with R LE but with cuing and facilitation able to advance to reciprocal pattern resulting in increased gait speed ? ?Due to aphasia primarily relied on tactile cuing and manual facilitation for sequencing gait to which pt responded well. ? ?Pt able to use 2 picture option to select bed vs chair and agreeable to remain sitting up in TIS. Transported back to room and left partially tilted back with needs in reach, seat belt alarm on, R UE supported on pillow, and his wife present. ? ? ? ?Therapy Documentation ?Precautions:  ?Precautions ?Precautions: Fall, Other (comment) ?Precaution Comments: R hemi, global aphasia, cortrak ?Restrictions ?Weight Bearing Restrictions: No ? ?Pain: ?No indications of pain throughout session. ? ? ?Therapy/Group: Individual Therapy ? ?Tawana Scale , PT, DPT, NCS, CSRS ?02/20/2022, 12:40 PM  ?

## 2022-02-20 NOTE — Progress Notes (Signed)
Speech Language Pathology Daily Session Note ? ?Patient Details  ?Name: [name] ?MRN: [id number] ?Date of Birth: 16-May-1961 ? ?Today's Date: 02/20/2022 ?SLP Individual Time: 1105-1200 ?SLP Individual Time Calculation (min): 55 min ? ?Short Term Goals: ?Week 1: SLP Short Term Goal 1 (Week 1): Patient will consume current diet with minimal s/s of aspiration with with min A cues to implement safe swallowing strategies/precautions (slow rate, small bites/sips) ?SLP Short Term Goal 2 (Week 1): Patient will respond to yes/no questions via multimodal means with max A multimodal cues to achieve 60% accuracy ?SLP Short Term Goal 3 (Week 1): Patient will ID field of 3 words with mod A multimodal cues to achieve 60% accuracy ?SLP Short Term Goal 4 (Week 1): Pt will ID field of 3 objects with mod A multimodal cues to achieve 50% accuracy ?SLP Short Term Goal 5 (Week 1): Patient complete object-to-word matching with max A multimodal cues to achieve 50% accuracy ?SLP Short Term Goal 6 (Week 1): Patient will verbalize "yeah" and "no" accuratly in response to yes/no questions with max A multimodal cues during 2/10 attempts. ? ?Skilled Therapeutic Interventions: ?Pt seen for skilled ST with focus on speech and communication goals, pt sitting upright in wheelchair. Pt wife and sister present throughout treatment and helpfully involved in patient care. SLP facilitating communication of basic yes/no questions by providing max fading to mod A cues for use of multimodal responses (I.e. verbalizations, head shake/nod, thumbs up/down and communication board). Pt verbalizing "yes" on 100% opportunities, sometimes combining "yes" and head shake "no" when meaning "no". Pt 90% accuracy with thumbs up/down for basic yes/no questions, ~50% accurate for more abstract yes/no questions. Pt also utilizing fist for "affirmative", wife reports likely due to [employer] background. Discussed best communication practices with nursing, sign hung by bed  to encourage staff to utilize strategies with patient to reduce frustration. Pt demonstrating apraxia during vocalization tasks, visual feedback from mirror plus max A verbal cues required for proper placement of articulators. Pt able to produce /m/ "ooo" and "aaa", mass practice with these sounds. Pt did produce spontaneous final /k/ but unable to produce on command. Pt agreeable to regular texture trials, min extended oral phase however pt clearing oral cavity with independent lingual sweep with no significant stasis or signs of aspiration. Will continue to encourage PO of Dys 3/thin with advancement as appropriate. Pt left in wheelchair with family present for needs. Cont ST POC.  ? ?Pain ?Pain Assessment ?Pain Scale: 0-10 ?Pain Score: 0-No pain ? ?Therapy/Group: Individual Therapy ? ?Tacey Ruiz ?02/20/2022, 12:01 PM ?

## 2022-02-21 LAB — BASIC METABOLIC PANEL
Anion gap: 8 (ref 5–15)
BUN: 23 mg/dL — ABNORMAL HIGH (ref 6–20)
CO2: 24 mmol/L (ref 22–32)
Calcium: 8.7 mg/dL — ABNORMAL LOW (ref 8.9–10.3)
Chloride: 105 mmol/L (ref 98–111)
Creatinine, Ser: 1.01 mg/dL (ref 0.61–1.24)
GFR, Estimated: >60 mL/min (ref >60)
Glucose, Bld: 152 mg/dL — ABNORMAL HIGH (ref 70–99)
Potassium: 4.1 mmol/L (ref 3.5–5.1)
Sodium: 137 mmol/L (ref 135–145)

## 2022-02-21 LAB — GLUCOSE, CAPILLARY
Glucose-Capillary: 175 mg/dL — ABNORMAL HIGH (ref 70–99)
Glucose-Capillary: 117 mg/dL — ABNORMAL HIGH (ref 70–99)
Glucose-Capillary: 138 mg/dL — ABNORMAL HIGH (ref 70–99)
Glucose-Capillary: 207 mg/dL — ABNORMAL HIGH (ref 70–99)

## 2022-02-21 NOTE — Progress Notes (Signed)
Occupational Therapy Session Note ? ?Patient Details  ?Name: Gordon Fisher ?MRN: [id number] ?Date of Birth: [birth year] ? ?Today's Date: 02/21/2022 ?OT Individual Time: 1430-1500 ?OT Individual Time Calculation (min): 30 min  ? ? ?Short Term Goals: ?Week 1:  OT Short Term Goal 1 (Week 1): Pt will maintain static sitting balance EOB with min A >5 min. ?OT Short Term Goal 2 (Week 1): Pt will complete toilet transfer with max A of 2 and no lift. ?OT Short Term Goal 3 (Week 1): Pt will don shirt in support sitting with min A. ?OT Short Term Goal 4 (Week 1): Pt will position RUE on pillow with max A. ? ? ?Skilled Therapeutic Interventions/Progress Updates:  ?  Pt greeted at time of session bed level resting with family present, wife remained during session. Pt saying "yeah" to all questions, attempted to provide mostly yes/no questions. Focus of session on bed mobility supine > sit MAX A and once sitting, maintaining midline and decreasing R lean. Weight bearing R<>L with MOD to facilitate truncal support pushing up on R arm for 1x10. Family ed regarding performing PROM for RUE as wife had questions, performing hands on training for shoulder flexion/extension and elbow flexion/extension as well as education on positioning bed level to prevent submuxation and promote digit extension. In sitting, attempted to facilitate pt performing push/pull motion with BUEs, no activation felt on R side but did actively participate. Scooting toward [facility] with extended time for processing, MAX A. Sit > supine same manner and scoot up in bed. Alarm on call bell in reach.  ? ?Therapy Documentation ?Precautions:  ?Precautions ?Precautions: Fall, Other (comment) ?Precaution Comments: R hemi, global aphasia, cortrak ?Restrictions ?Weight Bearing Restrictions: No ? ? ? ? ? ?Therapy/Group: Individual Therapy ? ?Erasmo Score ?02/21/2022, 7:24 AM ?

## 2022-02-21 NOTE — Progress Notes (Signed)
?                                                       PROGRESS NOTE ? ? ?Subjective/Complaints: ? ?Pt able to say "yes' and no other word ?Per nursing/staff, pt's rash still there, but is better with last benadryl yesterday and using sarna lotion and cortisone cream.  ?Mainly stomach/groin- and some on arms/back- ? ? ?ROS-  ?Limited by aphasia ? ?Objective: ?  ?No results found. ?Recent Labs  ?  02/19/22 ?0527  ?WBC 8.7  ?HGB 15.8  ?HCT 45.3  ?PLT 186  ? ?Recent Labs  ?  02/19/22 ?[id number] 02/21/22 ?0636  ?NA 135 137  ?K 4.1 4.1  ?CL 103 105  ?CO2 23 24  ?GLUCOSE 126* 152*  ?BUN 24* 23*  ?CREATININE 1.11 1.01  ?CALCIUM 8.8* 8.7*  ? ? ?Intake/Output Summary (Last 24 hours) at 02/21/2022 1429 ?Last data filed at 02/21/2022 [id number] ?Gross per 24 hour  ?Intake 1817 ml  ?Output 250 ml  ?Net 1567 ml  ?  ? ?  ? ?Physical Exam: ?Vital Signs ?Blood pressure 131/74, pulse 97, temperature 98.3 ?F (36.8 ?C), temperature source Oral, resp. rate 20, height 5\' 8"  (1.727 m), weight 106 kg, SpO2 94 %. ? ? ? ?General: awake, alert, appropriate, sitting up in bed; nurse in room; NAD ?HENT: conjugate gaze; oropharynx moist ?CV: regular rate; no JVD ?Pulmonary: CTA B/L; no W/R/R- good air movement ?GI: soft, NT, ND, (+)BS ?Psychiatric: appropriate/bright affect ?Neurological: receptive/expressive aphasia- says yes only ?Extremities: No clubbing, cyanosis, or edema ?Skin: rash MUCH better- very faded/not really red anymore- mild erythema, but canot actually see a rash on lower abdomen today- groin almost resolved- but still scratching at it. Also on arms/back ?Neurologic: Cranial nerves II through XII intact, motor strength is 5/5 in left and 0/5 RIght deltoid, bicep, tricep, grip, hip flexor, knee extensors, ankle dorsiflexor and plantar flexor ?Trace Right hip add ?Exp> recept aphasia , answers yes to all questions  ?Musculoskeletal: Full range of motion in all 4 extremities. No joint swelling ? ? ? ?Assessment/Plan: ?1. Functional deficits  which require 3+ hours per day of interdisciplinary therapy in a comprehensive inpatient rehab setting. ?Physiatrist is providing close team supervision and 24 hour management of active medical problems listed below. ?Physiatrist and rehab team continue to assess barriers to discharge/monitor patient progress toward functional and medical goals ? ?Care Tool: ? ?Bathing ?   ?Body parts bathed by patient: Right arm, Chest, Abdomen, Front perineal area, Buttocks, Face, Right upper leg, Left upper leg  ? Body parts bathed by helper: Left arm ?  ?  ?Bathing assist Assist Level: 2 Helpers (MOD A +2) ?  ?  ?Upper Body Dressing/Undressing ?Upper body dressing   ?What is the patient wearing?: [place] only ?   ?Upper body assist Assist Level: Minimal Assistance - Patient > 75% ?   ?Lower Body Dressing/Undressing ?Lower body dressing ? ? ?   ?What is the patient wearing?: Pants, Incontinence brief ? ?  ? ?Lower body assist Assist for lower body dressing: 2 Helpers (MOD A +2) ?   ? ?Toileting ?Toileting Toileting Activity did not occur (Probation officer and hygiene only): N/A (no void or bm)  ?Toileting assist   ?  ?  ?Transfers ?Chair/bed transfer ? ?Transfers assist ?   ? ?  Chair/bed transfer assist level: Moderate Assistance - Patient 50 - 74% (squat pivot to L) ?  ?  ?Locomotion ?Ambulation ? ? ?Ambulation assist ? ? Ambulation activity did not occur: Safety/medical concerns ? ?Assist level: 2 helpers (max A of 1 and +2 w/c follow) ?Assistive device: Other (comment) (L hallway rail) ?Max distance: 55ft  ? ?Walk 10 feet activity ? ? ?Assist ? Walk 10 feet activity did not occur: Safety/medical concerns ? ?  ?   ? ?Walk 50 feet activity ? ? ?Assist Walk 50 feet with 2 turns activity did not occur: Safety/medical concerns ? ?  ?   ? ? ?Walk 150 feet activity ? ? ?Assist Walk 150 feet activity did not occur: Safety/medical concerns ? ?  ?  ?  ? ?Walk 10 feet on uneven surface  ?activity ? ? ?Assist Walk 10 feet on  uneven surfaces activity did not occur: Safety/medical concerns ? ? ?  ?   ? ?Wheelchair ? ? ? ? ?Assist Is the patient using a wheelchair?: No ?  ?Wheelchair activity did not occur: Safety/medical concerns ? ?  ?   ? ? ?Wheelchair 50 feet with 2 turns activity ? ? ? ?Assist ? ?  ?Wheelchair 50 feet with 2 turns activity did not occur: Safety/medical concerns ? ? ?   ? ?Wheelchair 150 feet activity  ? ? ? ?Assist ? Wheelchair 150 feet activity did not occur: Safety/medical concerns ? ? ?   ? ?Blood pressure 131/74, pulse 97, temperature 98.3 ?F (36.8 ?C), temperature source Oral, resp. rate 20, height 5\' 8"  (1.727 m), weight 106 kg, SpO2 94 %. ? ?Medical Problem List and Plan: ?1. 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            -patient may not shower ?            -ELOS/Goals: 21-23 days ?            Continue CIR- PT, OT and SLP  ?2.  Antithrombotics: ?-DVT/anticoagulation:  Pharmaceutical: Lovenox added as 5 days out from [facility] ?            -antiplatelet therapy:  on ASA/Brillinta ?3. Pain Management: Tylenol prn.  ?4. Mood: LCSW to follow for evaluation and support when appropriate.  ?            -antipsychotic agents: N/A ?5. Neuropsych: This patient is not capable of making decisions on his own behalf. ?6. Skin/Wound Care: Routine pressure relief measures.  ?7. Fluids/Electrolytes/Nutrition: Will monitor I/O. CMET ok except low alb  ?8. Hyponatremia: Stable. Monitor with serial checks.  ?9. Prediabetes: Hgb A1C-6.0. BS poorly controlled due to continuous tube feeds.  ?--Add CM restrictions to diet and have dietician educate pt/wife ?--Monitor BS ac/hs for now ?4/23- CBGs 110s-170s- con't regimen. ?10. Dysphagia: D3 thin liq Will change tube feeds to 10 hours from 6 pm to 4 am to help stimulate appetite.  Hope to d/c NG tube on Monday or tues ?            --Start calorie count X 72 hours in am. ?11. Leucocytosis: resolved 4/21 ?         ? ?  Latest Ref Rng & Units 02/19/2022  ?  5:27 AM 02/17/2022  ?  3:00  AM 02/16/2022  ?  2:00 AM  ?CBC  ?WBC 4.0 - 10.5 K/uL 8.7   10.6   9.7    ?Hemoglobin 13.0 - 17.0 g/dL 15.8  16.1   16.5    ?Hematocrit 39.0 - 52.0 % 45.3   45.2   45.6    ?Platelets 150 - 400 K/uL 186   196   195    ?  ?12. Elevated LDL: continue Lipitor. ?13.  AKI: Encourage fluid intake. Repeat creatinine tomorrow.  ? 4/22- will check BMP in AM to make sure drinking.  ? 4/23- BUN 23- very slightly elevated and Cr 1.01- so very slightly dry- will have nursing push fluids.  ?14. Hypocalcemia:  Ionized Ca- 1.11 with normal albumin-4.1.  ?--Add calcium supplement bid.  ?15. Elevated Homocysteine levels:  B12, folate WNL--discussed with Dr. Erlinda Hong who felt no treatment needed as less than 50.  ?16. Obesity: BMI 35.53- provide dietary education. ? 17. Rash- chest and arms ? 4/22- will start Sarna lotion, cortisone cream and hypoallergenic sheets as well as benadryl 12.5 mg elixir prn.  ? 4/23- rash doing better- hypoallergenic sheets to be started today.  ? ? ?I spent a total of 36   minutes on total care today- >50% coordination of care- due to loking at rash- second visit with Nursing.  ? ?LOS: ?3 days ?A FACE TO FACE EVALUATION WAS PERFORMED ? ?Jonella Redditt ?02/21/2022, 2:29 PM  ? ? ? ?

## 2022-02-21 NOTE — Plan of Care (Signed)
?  Problem: Consults ?Goal: RH STROKE PATIENT EDUCATION ?Description: See Patient Education module for education specifics  ?Outcome: Progressing ?  ?Problem: RH BOWEL ELIMINATION ?Goal: RH STG MANAGE BOWEL WITH ASSISTANCE ?Description: STG Manage Bowel with mod I Assistance. ?Outcome: Progressing ?Goal: RH STG MANAGE BOWEL W/MEDICATION W/ASSISTANCE ?Description: STG Manage Bowel with Medication with mod I Assistance. ?Outcome: Progressing ?  ?Problem: RH SAFETY ?Goal: RH STG ADHERE TO SAFETY PRECAUTIONS W/ASSISTANCE/DEVICE ?Description: STG Adhere to Safety Precautions With cues Assistance/Device. ?Outcome: Progressing ?  ?Problem: RH PAIN MANAGEMENT ?Goal: RH STG PAIN MANAGED AT OR BELOW PT'S PAIN GOAL ?Description: At or below level 4 with prns ?Outcome: Progressing ?  ?Problem: RH KNOWLEDGE DEFICIT ?Goal: RH STG INCREASE KNOWLEDGE OF DIABETES ?Description: Patient and spouse will be able to manage prediabetes with dietary modifications using handouts and educational materials independently ?Outcome: Progressing ?Goal: RH STG INCREASE KNOWLEDGE OF DYSPHAGIA/FLUID INTAKE ?Description: Patient and spouse will be able to manage dysphagia, medications and dietary modifications using handouts and educational materials independently ?Outcome: Progressing ?Goal: RH STG INCREASE KNOWLEGDE OF HYPERLIPIDEMIA ?Description: Patient and spouse will be able to manage HLD with medications and dietary modifications using handouts and educational materials independently ?Outcome: Progressing ?Goal: RH STG INCREASE KNOWLEDGE OF STROKE PROPHYLAXIS ?Description: Patient and spouse will be able to manage secondary stroke risks with medications and dietary modifications using handouts and educational materials independently ?Outcome: Progressing ?  ?

## 2022-02-22 LAB — BASIC METABOLIC PANEL
Anion gap: 7 (ref 5–15)
BUN: 25 mg/dL — ABNORMAL HIGH (ref 6–20)
CO2: 24 mmol/L (ref 22–32)
Calcium: 8.5 mg/dL — ABNORMAL LOW (ref 8.9–10.3)
Chloride: 106 mmol/L (ref 98–111)
Creatinine, Ser: 1.06 mg/dL (ref 0.61–1.24)
GFR, Estimated: >60 mL/min (ref >60)
Glucose, Bld: 142 mg/dL — ABNORMAL HIGH (ref 70–99)
Potassium: 4.1 mmol/L (ref 3.5–5.1)
Sodium: 137 mmol/L (ref 135–145)

## 2022-02-22 LAB — CBC
HCT: 43.5 % (ref 39.0–52.0)
Hemoglobin: 15.1 g/dL (ref 13.0–17.0)
MCH: 31.4 pg (ref 26.0–34.0)
MCHC: 34.7 g/dL (ref 30.0–36.0)
MCV: 90.4 fL (ref 80.0–100.0)
Platelets: 222 10*3/uL (ref 150–400)
RBC: 4.81 MIL/uL (ref 4.22–5.81)
RDW: 12.1 % (ref 11.5–15.5)
WBC: 8.8 10*3/uL (ref 4.0–10.5)
nRBC: 0 % (ref 0.0–0.2)

## 2022-02-22 LAB — GLUCOSE, CAPILLARY
Glucose-Capillary: 149 mg/dL — ABNORMAL HIGH (ref 70–99)
Glucose-Capillary: 178 mg/dL — ABNORMAL HIGH (ref 70–99)
Glucose-Capillary: 130 mg/dL — ABNORMAL HIGH (ref 70–99)
Glucose-Capillary: 180 mg/dL — ABNORMAL HIGH (ref 70–99)

## 2022-02-22 NOTE — Progress Notes (Signed)
Physical Therapy Session Note ? ?Patient Details  ?Name: Gordon Fisher ?MRN: [id number] ?Date of Birth: 18-Feb-1961 ? ?Today's Date: 02/22/2022 ?PT Individual Time: 1103-1594 ?PT Individual Time Calculation (min): 57 min  ? ?Short Term Goals: ?Week 1:  PT Short Term Goal 1 (Week 1): Pt will transfer sup <> sit w/ mod A ?PT Short Term Goal 2 (Week 1): Pt will transfer sit to stand w/ Max A from lower surface. ?PT Short Term Goal 3 (Week 1): Pt will perform SPT w/ max A ? ?Skilled Therapeutic Interventions/Progress Updates:  ?   ? ?Pt presenting supine in bed to start session. LPN in room for morning rx. During this, discussed with wife general DC planning and PT goals. Wife brought tennis shoes from home, x2 pairs that are wide fitting - will do well for gait training and eventual AFO fitting. Wife appears very supportive, writing notes during discussion. ? ?Pt with aphasia and says "yes" to all answers but appears agreeable to PT tx. Assisted with LB dressing bed level - modA for threading and pt able to assist with pulling over hips fairly well with modified bridging. Supine<>sitting EOB requiring minA to roll to his R and maxA for R sidelying to sitting EOB. Fair sitting balance once acclimating to upright with SBA for unsupported sitting. Removed hospital gown and donned shirt with maxA for time management. Pinned feeding tube to shirt via safety pin. Squat<>pivot transfer with heavy modA from EOB to his stronger L side, R knee blocked to prevent buckling - some apraxia present with transfer and required assist for repositioning hips in TIS w/c.  ? ?Transported to main rehab gym for time and assisted to mat table in similar manner. ? ?Worked on [employer] for remainder of session for standing balance, sit<>stand transfers, RLE weightbearing, and pre-gait training. Mirror used throughout for visual feedback which he responded well to. 2x10 sit<>stands with his R arm draped around therapist's shoulder and R knee blocked,  using back of arm chair to support LUE in standing. Progressing from maxA to [place] with tactile > verbal cueing.  ? ?Pre-gait training with similar setup as above, forward/backward stepping with L foot working on R stance control and R weight bearing. Mod/maxA for steadying and balance with high likelihood of R knee buckling during stance phase. Stability and sequencing improved with repetitions, 2x10.  ? ?Stair training using 6inch step and 1 hand rail on L - maxA required for both ascending and descending 1 step. Pt able to correctly ascend with L foot leading but unable to recall descending backwards with R foot leading resulting in R knee buckling during descent. Difficulty with corrections and postural stability requiring maxA for balance. Deferred further efforts for safety concerns.  ? ?Pt transported back to his room and remained reclined in TIS w/c with safety belt alarm on, wife updated on pt's mobility and progress. All needs met.  ? ? ?Therapy Documentation ?Precautions:  ?Precautions ?Precautions: Fall, Other (comment) ?Precaution Comments: R hemi, global aphasia, cortrak ?Restrictions ?Weight Bearing Restrictions: No ?General: ?  ? ?Therapy/Group: Individual Therapy ? ?Alger Simons PT ?02/22/2022, 7:35 AM  ?

## 2022-02-22 NOTE — Progress Notes (Signed)
Occupational Therapy Session Note ? ?Patient Details  ?Name: Gordon Fisher ?MRN: [id number] ?Date of Birth: Jun 21, 1961 ? ?Today's Date: 02/22/2022 ?OT Individual Time: [id number] ?OT Individual Time Calculation (min): 58 min  ? ? ?Short Term Goals: ?Week 1:  OT Short Term Goal 1 (Week 1): Pt will maintain static sitting balance EOB with min A >5 min. ?OT Short Term Goal 2 (Week 1): Pt will complete toilet transfer with max A of 2 and no lift. ?OT Short Term Goal 3 (Week 1): Pt will don shirt in support sitting with min A. ?OT Short Term Goal 4 (Week 1): Pt will position RUE on pillow with max A. ? ?Skilled Therapeutic Interventions/Progress Updates:  ?  Patient received up in wheelchair fully dressed.  Wife, [name] present in room reporting he is in clean clothing, and has had a bath, but has not brushed teeth, or washed his face.  Patient taken to sink.  Wife indicates he does not seem to comprehend spitting after brushing teeth.  Patient used left hand to hold toothbrush and brush teeth - when done - cued patient to lean forward and held basin to mouth - patient able to spit after toothbrushing.   ?Neuromuscular reeducation:  Transported patient to gym to address RUE movement as well as functional transfers.  Patient able to transfer to and from wheelchair with min assist with increased time and multiple squats.  Patient with left bias in sitting - but able/willing to accept weight toward right side.  Patient needed overt cueing to visually attend to right hand.  Cueing and facilitation for upright midline (or slightly rightward) postural set, then Right hand on a moveable surface.  Patient with significant delay - but with slight forced use paradigm - able to increase tension in right arm and move toward shoulder flex and extension and internal rotation.   ?Transitioned to standing from slightly elevated mat table with mod/min assist - although shifting weight toward left side.  Needs further work here.   ? ?Therapy  Documentation ?Precautions:  ?Precautions ?Precautions: Fall, Other (comment) ?Precaution Comments: R hemi, global aphasia, cortrak ?Restrictions ?Weight Bearing Restrictions: No ? ?Pain: ?Pain Assessment ?Pain Scale: 0-10 ?Pain Score: 0-No pain ? ? ? ? ?Therapy/Group: Individual Therapy ? ?Mariah Milling ?02/22/2022, 12:17 PM ?

## 2022-02-22 NOTE — Progress Notes (Signed)
Patient ID: Gordon Fisher, male   DOB: [birth year], 61 y.o.   MRN: [id number] ?Met with the patient and family to introduce rehab process, team conference and plan of care. Reviewed medications including ASA = Brilinta x 3 mths per neurology along with loop recorder maintenance. Discussed secondary risk management including prediabetes and HLD( LDL:122/Trig:146) and dietary modification recommendations. Patient on D3 thin with cortrak supplemental feeds at present. Continue to follow along to discharge to address educational needs to facilitate preparation for discharge. [name] B ? ?

## 2022-02-22 NOTE — Progress Notes (Signed)
Calorie Count Note ? ?72 - hour calorie count ordered. ? ?Diet: Dysphagia 3, Thin Liquids  ?Supplements: Ensure Enlive - TID ? ?Day 1 - 02/19/2022 ?Breakfast: No Documentation ?Lunch: 345 kcal, 20 gm protein ?Dinner: 195 kcal, 16 gm protein ?Supplements: No Documentation  ? ?Total intake: ?540 kcal (25% of minimum estimated needs)  ?36 protein (33% of minimum estimated needs) ? ?Day 1 - 02/20/2022 ?Breakfast: No Documentation  ?Lunch: No Documentation ?Dinner: 573 kcal, 26 gm protein ?Supplements: 360 kcal, 20 gm protein ? ?Total intake: ?933 kcal (42% of minimum estimated needs)  ?46 protein (42% of minimum estimated needs) ? ?Day 1 - 02/21/2022 ?Breakfast: No Documentation ?Lunch: No Documentation ?Dinner: No Documentation ?Supplements: 360 kcal, 20 gm protein ? ?Total intake: ?360 kcal (16% of minimum estimated needs)  ?20 protein (18% of minimum estimated needs) ? ?Nutrition Dx: Inadequate oral intake related to dysphagia as evidence by meal completion <25%.  ? ?Goal: Patient will meet greater than or equal to 90% of their needs. ? ?Intervention:  ?Ensure Enlive - TID ?Nocturnal Tube Feeds via Cortrak ? ? ? ?Kirby Crigler RD, LDN ?Clinical Dietitian ?See AMi[facility] for contact information.  ? ? ?

## 2022-02-22 NOTE — Progress Notes (Addendum)
?                                                       PROGRESS NOTE ? ? ?Subjective/Complaints: ? ?Discussed swallowing and overall progress with wife as well as PT.  Wife has special needs adult child at home  ?ROS-  ?Limited by aphasia ? ?Objective: ?  ?No results found. ?Recent Labs  ?  02/22/22 ?0609  ?WBC 8.8  ?HGB 15.1  ?HCT 43.5  ?PLT 222  ? ? ?Recent Labs  ?  02/21/22 ?0636 02/22/22 ?[id number]  ?NA 137 137  ?K 4.1 4.1  ?CL 105 106  ?CO2 24 24  ?GLUCOSE 152* 142*  ?BUN 23* 25*  ?CREATININE 1.01 1.06  ?CALCIUM 8.7* 8.5*  ? ? ? ?Intake/Output Summary (Last 24 hours) at 02/22/2022 0839 ?Last data filed at 02/22/2022 0807 ?Gross per 24 hour  ?Intake 1230 ml  ?Output 1050 ml  ?Net 180 ml  ? ?  ? ?  ? ?Physical Exam: ?Vital Signs ?Blood pressure 115/83, pulse 88, temperature (!) 97.5 ?F (36.4 ?C), temperature source Oral, resp. rate 16, height 5\' 8"  (1.727 m), weight 106 kg, SpO2 95 %. ? ? ?General: No acute distress ?Mood and affect are appropriate ?Heart: Regular rate and rhythm no rubs murmurs or extra sounds ?Lungs: Clear to auscultation, breathing unlabored, no rales or wheezes ?Abdomen: Positive bowel sounds, soft nontender to palpation, nondistended ?Extremities: No clubbing, cyanosis, or edema ?Skin: No evidence of breakdown, no evidence of rash ? ?Neurologic: Cranial nerves II through XII intact, motor strength is 5/5 in left and 0/5 RIght deltoid, bicep, tricep, grip, hip flexor, knee extensors, ankle dorsiflexor and plantar flexor ?Trace Right hip add ?Global aphaisia unable to follow commands including eye closing, will move L arm up with gestural cues but perseverates on this motion  ?Musculoskeletal: Full range of motion in all 4 extremities. No joint swelling ? ? ? ?Assessment/Plan: ?1. Functional deficits which require 3+ hours per day of interdisciplinary therapy in a comprehensive inpatient rehab setting. ?Physiatrist is providing close team supervision and 24 hour management of active medical  problems listed below. ?Physiatrist and rehab team continue to assess barriers to discharge/monitor patient progress toward functional and medical goals ? ?Care Tool: ? ?Bathing ?   ?Body parts bathed by patient: Right arm, Chest, Abdomen, Front perineal area, Buttocks, Face, Right upper leg, Left upper leg  ? Body parts bathed by helper: Left arm ?  ?  ?Bathing assist Assist Level: 2 Helpers (MOD A +2) ?  ?  ?Upper Body Dressing/Undressing ?Upper body dressing   ?What is the patient wearing?: [place] only ?   ?Upper body assist Assist Level: Minimal Assistance - Patient > 75% ?   ?Lower Body Dressing/Undressing ?Lower body dressing ? ? ?   ?What is the patient wearing?: Incontinence brief ? ?  ? ?Lower body assist Assist for lower body dressing: Dependent - Patient 0% ?   ? ?Toileting ?Toileting Toileting Activity did not occur (Probation officer and hygiene only): N/A (no void or bm)  ?Toileting assist Assist for toileting: Dependent - Patient 0% ?  ?  ?Transfers ?Chair/bed transfer ? ?Transfers assist ?   ? ?Chair/bed transfer assist level: Moderate Assistance - Patient 50 - 74% (squat pivot to L) ?  ?  ?Locomotion ?Ambulation ? ? ?  Ambulation assist ? ? Ambulation activity did not occur: Safety/medical concerns ? ?Assist level: 2 helpers (max A of 1 and +2 w/c follow) ?Assistive device: Other (comment) (L hallway rail) ?Max distance: 24ft  ? ?Walk 10 feet activity ? ? ?Assist ? Walk 10 feet activity did not occur: Safety/medical concerns ? ?  ?   ? ?Walk 50 feet activity ? ? ?Assist Walk 50 feet with 2 turns activity did not occur: Safety/medical concerns ? ?  ?   ? ? ?Walk 150 feet activity ? ? ?Assist Walk 150 feet activity did not occur: Safety/medical concerns ? ?  ?  ?  ? ?Walk 10 feet on uneven surface  ?activity ? ? ?Assist Walk 10 feet on uneven surfaces activity did not occur: Safety/medical concerns ? ? ?  ?   ? ?Wheelchair ? ? ? ? ?Assist Is the patient using a wheelchair?: No ?  ?Wheelchair  activity did not occur: Safety/medical concerns ? ?  ?   ? ? ?Wheelchair 50 feet with 2 turns activity ? ? ? ?Assist ? ?  ?Wheelchair 50 feet with 2 turns activity did not occur: Safety/medical concerns ? ? ?   ? ?Wheelchair 150 feet activity  ? ? ? ?Assist ? Wheelchair 150 feet activity did not occur: Safety/medical concerns ? ? ?   ? ?Blood pressure 115/83, pulse 88, temperature (!) 97.5 ?F (36.4 ?C), temperature source Oral, resp. rate 16, height 5\' 8"  (1.727 m), weight 106 kg, SpO2 95 %. ? ?Medical Problem List and Plan: ?1. 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            -patient may not shower ?            -ELOS/Goals: 21-23 days ?            Continue CIR- PT, OT and SLP  ?2.  Antithrombotics: ?-DVT/anticoagulation:  Pharmaceutical: Lovenox added as 5 days out from [facility] ?            -antiplatelet therapy:  on ASA/Brillinta ?3. Pain Management: Tylenol prn.  ?4. Mood: LCSW to follow for evaluation and support when appropriate.  ?            -antipsychotic agents: N/A ?5. Neuropsych: This patient is not capable of making decisions on his own behalf. ?6. Skin/Wound Care: Routine pressure relief measures.  ?7. Fluids/Electrolytes/Nutrition: Will monitor I/O. CMET ok except low alb  ?8. Hyponatremia: Stable. Monitor with serial checks.  ?9. Prediabetes: Hgb A1C-6.0. fair control  ?-CBG (last 3)  ?Recent Labs  ?  02/21/22 ?1635 02/21/22 ?2125 02/22/22 ?0618  ?[place] ? ?10. Dysphagia: D3 thin liq Will change tube feeds to 10 hours from 6 pm to 4 am to help stimulate appetite.   d/c NG tube today  ?          eating 75-100% meals  ?11. Leucocytosis: resolved 4/21 ?         ? ?  Latest Ref Rng & Units 02/22/2022  ?  6:09 AM 02/19/2022  ?  5:27 AM 02/17/2022  ?  3:00 AM  ?CBC  ?WBC 4.0 - 10.5 K/uL 8.8   8.7   10.6    ?Hemoglobin 13.0 - 17.0 g/dL 15.1   15.8   16.1    ?Hematocrit 39.0 - 52.0 % 43.5   45.3   45.2    ?Platelets 150 - 400 K/uL 222   186   196    ?  ?  12. Elevated LDL: continue  Lipitor. ?13.  AKI: resolved 4/24 ?  ? ?  Latest Ref Rng & Units 02/22/2022  ?  6:09 AM 02/21/2022  ?  6:36 AM 02/19/2022  ?  5:27 AM  ?BMP  ?Glucose 70 - 99 mg/dL 142   152   126    ?BUN 6 - 20 mg/dL 25   23   24     ?Creatinine 0.61 - 1.24 mg/dL 1.06   1.01   1.11    ?Sodium 135 - 145 mmol/L 137   137   135    ?Potassium 3.5 - 5.1 mmol/L 4.1   4.1   4.1    ?Chloride 98 - 111 mmol/L 106   105   103    ?CO2 22 - 32 mmol/L 24   24   23     ?Calcium 8.9 - 10.3 mg/dL 8.5   8.7   8.8    ? ? ?14. Hypocalcemia:  Ionized Ca- 1.11 with normal albumin-4.1.  ?--Add calcium supplement bid.  ?15. Elevated Homocysteine levels:  B12, folate WNL--per Dr. Erlinda Hong , neuro, who felt no treatment needed as less than 50.  ?16. Obesity: BMI 35.53- provide dietary education. ? 17. Rash- chest and arms ? 4/22- will start Sarna lotion, cortisone cream and hypoallergenic sheets as well as benadryl 12.5 mg elixir prn.  ? 4/23- rash doing better- hypoallergenic sheets to be started today.  ? ? ? ?LOS: ?4 days ?A FACE TO FACE EVALUATION WAS PERFORMED ? ?Gordon Fisher ?02/22/2022, 8:39 AM  ? ? ? ?

## 2022-02-22 NOTE — Plan of Care (Signed)
?  Problem: Consults ?Goal: RH STROKE PATIENT EDUCATION ?Description: See Patient Education module for education specifics  ?Outcome: Progressing ?  ?Problem: RH BOWEL ELIMINATION ?Goal: RH STG MANAGE BOWEL WITH ASSISTANCE ?Description: STG Manage Bowel with mod I Assistance. ?Outcome: Progressing ?Goal: RH STG MANAGE BOWEL W/MEDICATION W/ASSISTANCE ?Description: STG Manage Bowel with Medication with mod I Assistance. ?Outcome: Progressing ?  ?Problem: RH SAFETY ?Goal: RH STG ADHERE TO SAFETY PRECAUTIONS W/ASSISTANCE/DEVICE ?Description: STG Adhere to Safety Precautions With cues Assistance/Device. ?Outcome: Progressing ?  ?Problem: RH PAIN MANAGEMENT ?Goal: RH STG PAIN MANAGED AT OR BELOW PT'S PAIN GOAL ?Description: At or below level 4 with prns ?Outcome: Progressing ?  ?Problem: RH KNOWLEDGE DEFICIT ?Goal: RH STG INCREASE KNOWLEDGE OF DIABETES ?Description: Patient and spouse will be able to manage prediabetes with dietary modifications using handouts and educational materials independently ?Outcome: Progressing ?Goal: RH STG INCREASE KNOWLEDGE OF DYSPHAGIA/FLUID INTAKE ?Description: Patient and spouse will be able to manage dysphagia, medications and dietary modifications using handouts and educational materials independently ?Outcome: Progressing ?Goal: RH STG INCREASE KNOWLEGDE OF HYPERLIPIDEMIA ?Description: Patient and spouse will be able to manage HLD with medications and dietary modifications using handouts and educational materials independently ?Outcome: Progressing ?Goal: RH STG INCREASE KNOWLEDGE OF STROKE PROPHYLAXIS ?Description: Patient and spouse will be able to manage secondary stroke risks with medications and dietary modifications using handouts and educational materials independently ?Outcome: Progressing ?  ?

## 2022-02-22 NOTE — Progress Notes (Signed)
Speech Language Pathology Daily Session Note ? ?Patient Details  ?Name: [name] ?MRN: [id number] ?Date of Birth: 1961/03/31 ? ?Today's Date: 02/22/2022 ?SLP Individual Time: 1300-1400 ?SLP Individual Time Calculation (min): 60 min ? ?Short Term Goals: ?Week 1: SLP Short Term Goal 1 (Week 1): Patient will consume current diet with minimal s/s of aspiration with with min A cues to implement safe swallowing strategies/precautions (slow rate, small bites/sips) ?SLP Short Term Goal 2 (Week 1): Patient will respond to yes/no questions via multimodal means with max A multimodal cues to achieve 60% accuracy ?SLP Short Term Goal 3 (Week 1): Patient will ID field of 3 words with mod A multimodal cues to achieve 60% accuracy ?SLP Short Term Goal 4 (Week 1): Pt will ID field of 3 objects with mod A multimodal cues to achieve 50% accuracy ?SLP Short Term Goal 5 (Week 1): Patient complete object-to-word matching with max A multimodal cues to achieve 50% accuracy ?SLP Short Term Goal 6 (Week 1): Patient will verbalize "yeah" and "no" accuratly in response to yes/no questions with max A multimodal cues during 2/10 attempts. ? ?Skilled Therapeutic Interventions: Skilled ST treatment focused on communication goals. Pt was accompanied by spouse who reported increased spontaneous verbalizations to include "no", "nope", and "okay" and appropriate with the context of the communication exchange. Spouse stated she continues to encourage yes/no communication board for clarification of pt's responses. SLP facilitated communication of basic yes/no questions by providing mod A cues for use of multimodal responses (i.e., head nod/shake, communication board) to achieve 78% accuracy with biographical questions, and 75% accuracy with questions regarding immediate environment. Pt continues to verbalize "yes" or "yeah" during most opportunities or combination of "yes" and head shake "no" when intending to respond "no." Pt's also exhibited  change in intonation or facial expression with "no" answers. Pt unable to approximate the word "no" at this time. During imitation and vocalization tasks, patient was able to produce the following: /m/, "ooo", "aaa", /k/, and approximated "oh" with max A verbal and visual cues with mirror for visual feedback. Pt demonstrated signs of fatigue as evidenced by signing and increased difficulty obtaining adequate placement. Cannot rule out oral/verbal apraxia. SLP attempted automatic speech tasks however pt unable to elicit at this time. Patient identified written words in field of 3 with mod A verbal cues to achieve 75% accuracy; identified common objects in field of 3 with sup A verbal cues for repetition to achieve 100% accuracy. Pt already making excellent progress toward communication goals. Patient was left in wheelchair with family present. Notified RN of pt/spouse wishes to return to bed shortly following ST session. Continue per current plan of care.   ?   ? ?Pain ?Pain Assessment ?Pain Scale: 0-10 ?Pain Score: 0-No pain ? ?Therapy/Group: Individual Therapy ? ?Sivan Cuello T Fatina Sprankle ?02/22/2022, 4:17 PM ?

## 2022-02-22 NOTE — Progress Notes (Signed)
Physical Therapy Session Note ? ?Patient Details  ?Name: Gordon Fisher ?MRN: [id number] ?Date of Birth: [birth year] ? ?Today's Date: 02/22/2022 ?PT Individual Time: 1500-1530 ?PT Individual Time Calculation (min): 30 min  ? ?Short Term Goals: ?Week 1:  PT Short Term Goal 1 (Week 1): Pt will transfer sup <> sit w/ mod A ?PT Short Term Goal 2 (Week 1): Pt will transfer sit to stand w/ Max A from lower surface. ?PT Short Term Goal 3 (Week 1): Pt will perform SPT w/ max A ? ?Skilled Therapeutic Interventions/Progress Updates:  ?  pt received in bed and agreeable to therapy. No complaint of pain on arrival, but demonstrated discomfort when moving RLE. Pt utilizing yes/no questions with pointing to answers on sheet for communication. Pt verbally answers yes to all questions asked and required cueing to use sheet or thumbs up/down.  ? ?Pt transported to therapy gym for time management and energy conservation. Pt with squat pivot w/c<>mat table and w/c>bed with light mod A. Cueing for technique and assist to complete transfer to R side.  ? ?From mat table, performed Sit to stand with mod A for knee block and hip extension facilitation with L hand on chair back for support. Pt with intermittent lean into therapist on R side with fatigue. Visual feed  back from mirror with pt requiring cues to look up, unsure if able to correct from visual feed back or not understanding cues.  ? ?Transitioned to pre gait stepping with LLE for forced use of RLE in stance with LUE on chair for support as above. Step by step cues and tactile cues provided as able. Pt able to take ~ 4 steps before requiring max-total assist at knee block. Seated rest between bouts to allow for improved stance stability, requiring light min at best.  ? ?Pt returned to room and indicated return to bed with yes/no questioning. Bed mobility with min A for RLE management. Mild impulsivity noted, possibly fatigue related. Pt remained in supine with arm positioned on  pillow and was left with all needs in reach and alarm active.  ? ?Therapy Documentation ?Precautions:  ?Precautions ?Precautions: Fall, Other (comment) ?Precaution Comments: R hemi, global aphasia, cortrak ?Restrictions ?Weight Bearing Restrictions: No ?General: ?  ? ? ? ? ?Therapy/Group: Individual Therapy ? ?Marylene Land Romeka Scifres ?02/22/2022, 3:40 PM  ?

## 2022-02-23 LAB — GLUCOSE, CAPILLARY
Glucose-Capillary: 128 mg/dL — ABNORMAL HIGH (ref 70–99)
Glucose-Capillary: 125 mg/dL — ABNORMAL HIGH (ref 70–99)
Glucose-Capillary: 203 mg/dL — ABNORMAL HIGH (ref 70–99)
Glucose-Capillary: 172 mg/dL — ABNORMAL HIGH (ref 70–99)

## 2022-02-23 NOTE — Plan of Care (Signed)
?  Problem: Consults ?Goal: RH STROKE PATIENT EDUCATION ?Description: See Patient Education module for education specifics  ?Outcome: Progressing ?  ?Problem: RH BOWEL ELIMINATION ?Goal: RH STG MANAGE BOWEL WITH ASSISTANCE ?Description: STG Manage Bowel with mod I Assistance. ?Outcome: Progressing ?Goal: RH STG MANAGE BOWEL W/MEDICATION W/ASSISTANCE ?Description: STG Manage Bowel with Medication with mod I Assistance. ?Outcome: Progressing ?  ?Problem: RH SAFETY ?Goal: RH STG ADHERE TO SAFETY PRECAUTIONS W/ASSISTANCE/DEVICE ?Description: STG Adhere to Safety Precautions With cues Assistance/Device. ?Outcome: Progressing ?  ?Problem: RH PAIN MANAGEMENT ?Goal: RH STG PAIN MANAGED AT OR BELOW PT'S PAIN GOAL ?Description: At or below level 4 with prns ?Outcome: Progressing ?  ?Problem: RH KNOWLEDGE DEFICIT ?Goal: RH STG INCREASE KNOWLEDGE OF DIABETES ?Description: Patient and spouse will be able to manage prediabetes with dietary modifications using handouts and educational materials independently ?Outcome: Progressing ?Goal: RH STG INCREASE KNOWLEDGE OF DYSPHAGIA/FLUID INTAKE ?Description: Patient and spouse will be able to manage dysphagia, medications and dietary modifications using handouts and educational materials independently ?Outcome: Progressing ?Goal: RH STG INCREASE KNOWLEGDE OF HYPERLIPIDEMIA ?Description: Patient and spouse will be able to manage HLD with medications and dietary modifications using handouts and educational materials independently ?Outcome: Progressing ?Goal: RH STG INCREASE KNOWLEDGE OF STROKE PROPHYLAXIS ?Description: Patient and spouse will be able to manage secondary stroke risks with medications and dietary modifications using handouts and educational materials independently ?Outcome: Progressing ?  ?

## 2022-02-23 NOTE — Discharge Instructions (Addendum)
Inpatient Rehab Discharge Instructions  Gordon Fisher Discharge date and time:  03/19/22  Activities/Precautions/ Functional Status: Activity: no lifting, driving, or strenuous exercise till cleared by Md  Diet: cardiac diet--Low carb. Cut up food. Assist with set up and intermittent supervision. Wound Care: none needed   Functional status:  ___ No restrictions     ___ Walk up steps independently _X__ 24/7 supervision/assistance   ___ Walk up steps with assistance ___ Intermittent supervision/assistance  ___ Bathe/dress independently ___ Walk with walker     ___ Bathe/dress with assistance ___ Walk Independently    ___ Shower independently ___ Walk with assistance    _X__ Shower with assistance _X__ No alcohol     ___ Return to work/school ________   Special Instructions:   STROKE/TIA DISCHARGE INSTRUCTIONS SMOKING Cigarette smoking nearly doubles your risk of having a stroke & is the single most alterable risk factor  If you smoke or have smoked in the last 12 months, you are advised to quit smoking for your health. Most of the excess cardiovascular risk related to smoking disappears within a year of stopping. Ask you doctor about anti-smoking medications [place] Quit Line: 1-800-QUIT NOW Free Smoking Cessation Classes [phone]  CHOLESTEROL Know your levels; limit fat & cholesterol in your diet  Lipid Panel     Component Value Date/Time   CHOL 198 02/12/2022 0421   TRIG 146 02/12/2022 0421   HDL 47 02/12/2022 0421   CHOLHDL 4.2 02/12/2022 0421   VLDL 29 02/12/2022 0421   LDLCALC 122 (H) 02/12/2022 0421     Many patients benefit from treatment even if their cholesterol is at goal. Goal: Total Cholesterol (CHOL) less than 160 Goal:  Triglycerides (TRIG) less than 150 Goal:  HDL greater than 40 Goal:  LDL (LDLCALC) less than 100   BLOOD PRESSURE American Stroke Association blood pressure target is less that 120/80 mm/Hg  Your discharge blood pressure is:  BP: 121/79  Monitor your blood pressure Limit your salt and alcohol intake Many individuals will require more than one medication for high blood pressure  DIABETES (A1c is a blood sugar average for last 3 months) Goal HGBA1c is under 7% (HBGA1c is blood sugar average for last 3 months)  Diabetes: Pre-diabetes    Lab Results  Component Value Date   HGBA1C 6.0 (H) 02/11/2022    Your HGBA1c can be lowered with medications, healthy diet, and exercise. Check your blood sugar as directed by your physician Call your physician if you experience unexplained or low blood sugars.  PHYSICAL ACTIVITY/REHABILITATION Goal is 30 minutes at least 4 days per week  Activity: No driving, Therapies: see above  Return to work: N/A Activity decreases your risk of heart attack and stroke and makes your heart stronger.  It helps control your weight and blood pressure; helps you relax and can improve your mood. Participate in a regular exercise program. Talk with your doctor about the best form of exercise for you (dancing, walking, swimming, cycling).  DIET/WEIGHT Goal is to maintain a healthy weight  Your discharge diet is:  Diet Order             DIET DYS 3 Room service appropriate? Yes; Fluid consistency: Thin  Diet effective now                   liquids Your height is:  Height: 5\' 8"  (172.7 cm) Your current weight is: Weight: 233 lbs Your Body Mass Index (BMI) is:  BMI (Calculated):  35.54 Following the type of diet specifically designed for you will help prevent another stroke. Your goal weight is:  164 lbs Your goal Body Mass Index (BMI) is 19-24. Healthy food habits can help reduce 3 risk factors for stroke:  High cholesterol, hypertension, and excess weight.  RESOURCES Stroke/Support Group:  Call [phone]   STROKE EDUCATION PROVIDED/REVIEWED AND GIVEN TO PATIENT Stroke warning signs and symptoms How to activate emergency medical system (call 911). Medications prescribed at discharge. Need for  follow-up after discharge. Personal risk factors for stroke. Pneumonia vaccine given:  Flu vaccine given:  My questions have been answered, the writing is legible, and I understand these instructions.  I will adhere to these goals & educational materials that have been provided to me after my discharge from the hospital.     My questions have been answered and I understand these instructions. I will adhere to these goals and the provided educational materials after my discharge from the hospital.  Patient/Caregiver Signature _______________________________ Date __________  Clinician Signature _______________________________________ Date __________  Please bring this form and your medication list with you to all your follow-up doctor's appointments.      Information on my medicine - ELIQUIS (apixaban)  This medication education was reviewed with me or my healthcare representative as part of my discharge preparation.   Why was Eliquis prescribed for you? Eliquis was prescribed to treat blood clots that may have been found in the veins of your legs (deep vein thrombosis) or in your lungs (pulmonary embolism) and to reduce the risk of them occurring again.  What do You need to know about Eliquis ? The dose is ONE 5 mg tablet taken TWICE daily.  Eliquis may be taken with or without food.   Try to take the dose about the same time in the morning and in the evening. If you have difficulty swallowing the tablet whole please discuss with your pharmacist how to take the medication safely.  Take Eliquis exactly as prescribed and DO NOT stop taking Eliquis without talking to the doctor who prescribed the medication.  Stopping may increase your risk of developing a new blood clot.  Refill your prescription before you run out.  After discharge, you should have regular check-up appointments with your healthcare provider that is prescribing your Eliquis.    What do you do if you miss a  dose? If a dose of ELIQUIS is not taken at the scheduled time, take it as soon as possible on the same day and twice-daily administration should be resumed. The dose should not be doubled to make up for a missed dose.  Important Safety Information A possible side effect of Eliquis is bleeding. You should call your healthcare provider right away if you experience any of the following: Bleeding from an injury or your nose that does not stop. Unusual colored urine (red or dark brown) or unusual colored stools (red or black). Unusual bruising for unknown reasons. A serious fall or if you hit your head (even if there is no bleeding).  Some medicines may interact with Eliquis and might increase your risk of bleeding or clotting while on Eliquis. To help avoid this, consult your healthcare provider or pharmacist prior to using any new prescription or non-prescription medications, including herbals, vitamins, non-steroidal anti-inflammatory drugs (NSAIDs) and supplements.  This website has more information on Eliquis (apixaban): http://www.eliquis.com/eliquis/home

## 2022-02-23 NOTE — Progress Notes (Signed)
Physical Therapy Session Note ? ?Patient Details  ?Name: [name] ?MRN: [id number] ?Date of Birth: 12-08-1960 ? ?Today's Date: 02/23/2022 ?PT Individual Time: 3500-9381 ?PT Individual Time Calculation (min): 57 min  ? ?Short Term Goals: ?Week 1:  PT Short Term Goal 1 (Week 1): Pt will transfer sup <> sit w/ mod A ?PT Short Term Goal 2 (Week 1): Pt will transfer sit to stand w/ Max A from lower surface. ?PT Short Term Goal 3 (Week 1): Pt will perform SPT w/ max A ? ?Skilled Therapeutic Interventions/Progress Updates:  ?  Pt received sitting on BSC over toilet with stedy in place and nurse present assisting pt with toileting. Pt's wife present. Pt continues to remain globally aphasic (expressive more impaired than receptive) with more consistent yes/no responses using written options. Pt appears agreeable to therapy session. Sit>stand BSC>stedy with L UE support on stedy bar and therapist supporting R UE while providing heavy min assist for balance while rising - +2 assist performed dependent assist peri-care and LB clothing management. Stedy transfer to TIS w/c. In sitting, threaded on clean pants total assist (pt using L UE to assist as able with CGA/min assist for trunk control when leaning forward for safety). Sit>stand TIS w/c to no UE support with heavy mod assist of 1 for blocking R knee and maintaining balance due to strong R lean with +2 providing CGA for safety and assisting with pulling pants up over hips (pt using L UE to perform as much as possible).  Transported to/from gym in w/c for time management and energy conservation. ? ?Donned R LE ankle DF assist ACE wrap and leg lifter for gait training. ? ?Sit>stand w/c>L UE support on litegait handle with heavy mod assist of 1 for blocking R knee buckle while maintaining balance due to strong R lean/pushing with poor midline orientation while +2 assist donned litegait harness. ? ?Gait training ~158ft x2 in litegait harness providing partial BWS (provided  more support on 2nd trial) with the following facilitation/cuing:  ?- requires max/total assist to weight shift L onto L stance limb due to consistent heavy R lean/pushing ?- requires total assist to advance R LE during swing phase using leg lifter to facilitate ?- pt with poor R quad activation during stance with knee maintained in partial flexion (could palpate more quad activation once supported pt with more BWS) ?- facilitated increased R weight shift onto R stance limb to promote increased L LE step length as pt initially only performing step-to with L LE but with increased BWS and facilitation for this pt achieves reciprocal pattern on 2nd walk ? ?Doffed harness as described above. Transported back to room - pt using 2 picture choice and accurately requests to return to bed. ? ?R squat pivot TIS w/c>EOB with mod assist for blocking R knee and pivoting hips. Sit>supine with mod assist for R hemibody management onto bed. Pt left supine in bed with needs in reach, R UE supported on pillow, bed alarm on, and pt's wife present. ? ? ?Therapy Documentation ?Precautions:  ?Precautions ?Precautions: Fall, Other (comment) ?Precaution Comments: R hemi, global aphasia, cortrak ?Restrictions ?Weight Bearing Restrictions: No ? ?Pain: ? Pt continues to have R quad pain when therapist flexes his knee to position his foot underneath his BOS prior to performing sit>stand transfer - pt responds well to gentle R knee flexion/extension PROM with decrease in pain with knee flexion noted after. ? ? ?Therapy/Group: Individual Therapy ? ?Ginny Forth , PT, DPT, NCS, CSRS ?  02/23/2022, 12:13 PM  ?

## 2022-02-23 NOTE — Progress Notes (Signed)
Occupational Therapy Session Note ? ?Patient Details  ?Name: Gordon Fisher ?MRN: [id number] ?Date of Birth: 1961/03/22 ? ?Today's Date: 02/23/2022 ?OT Individual Time: 1005-1106 ?OT Individual Time Calculation (min): 61 min  ? ? ?Short Term Goals: ?Week 1:  OT Short Term Goal 1 (Week 1): Pt will maintain static sitting balance EOB with min A >5 min. ?OT Short Term Goal 2 (Week 1): Pt will complete toilet transfer with max A of 2 and no lift. ?OT Short Term Goal 3 (Week 1): Pt will don shirt in support sitting with min A. ?OT Short Term Goal 4 (Week 1): Pt will position RUE on pillow with max A. ? ?Skilled Therapeutic Interventions/Progress Updates:  ?Pt greeted supine in bed with wife present, pt  agreeable to OT intervention. Session focus on BADL reeducation, functional mobility, dynamic standing/sitting balance, RUE AAROM and decreasing overall caregiver burden.          ?Pt completed supine>sit with MOD A to R side. Pt completed EOB bathing with overall MOD A +2. Pt able sit>stand with MOD A +1 while pt washed back side and anterior periarea. Pt donned shift from EOB with MIN A needing cues to attend to RUE when donning shirt. Pt needed MOD A +2 to don pants from EOB needing assist to thread RLE into pants, able to stand with MOD A while pt pulled pants up to waist line, needing assist to pull pants up on R side. Pt completed squat pivot to L side with MOD A. Pt completed oral care at sink with MOD A needing max multimodal cues to get pt to spit into sink after rinsing.  Total A to don shoes/socks. ? ?Pt transported to gym with total A where pt completed squat pivot to EOM with MODA. Pt able to worked on [employer] with RUE on slanted stool with pt instructed to push stool forwards/backwards, additionally utilized [employer] to work on active assist horizontal shoulder ABD/ADD, pt needed MOD A to fully maneuver board R and L. Pt completed squat pivot back to w/c to L side with MOD A.  Pt transported back to room with total A.  pt left  up in w/c with nursing present as pt had urinated in brief.                   ? ? ?Therapy Documentation ?Precautions:  ?Precautions ?Precautions: Fall, Other (comment) ?Precaution Comments: R hemi, global aphasia, cortrak ?Restrictions ?Weight Bearing Restrictions: No ? ?Pain: unrated pain reported in RUE, rest breaks provided as needed.  ? ?Therapy/Group: Individual Therapy ? ?[name] ?02/23/2022, 12:21 PM ?

## 2022-02-23 NOTE — Progress Notes (Signed)
Speech Language Pathology Daily Session Note ? ?Patient Details  ?Name: Gordon Fisher ?MRN: [id number] ?Date of Birth: Sep 11, 1961 ? ?Today's Date: 02/23/2022 ?SLP Individual Time: [id number] ?SLP Individual Time Calculation (min): 59 min ? ?Short Term Goals: ?Week 1: SLP Short Term Goal 1 (Week 1): Patient will consume current diet with minimal s/s of aspiration with with min A cues to implement safe swallowing strategies/precautions (slow rate, small bites/sips) ?SLP Short Term Goal 2 (Week 1): Patient will respond to yes/no questions via multimodal means with max A multimodal cues to achieve 60% accuracy ?SLP Short Term Goal 3 (Week 1): Patient will ID field of 3 words with mod A multimodal cues to achieve 60% accuracy ?SLP Short Term Goal 4 (Week 1): Pt will ID field of 3 objects with mod A multimodal cues to achieve 50% accuracy ?SLP Short Term Goal 5 (Week 1): Patient complete object-to-word matching with max A multimodal cues to achieve 50% accuracy ?SLP Short Term Goal 6 (Week 1): Patient will verbalize "yeah" and "no" accuratly in response to yes/no questions with max A multimodal cues during 2/10 attempts. ? ?Skilled Therapeutic Interventions: Skilled ST treatment focused on language goals. With assistance of nurse, SLP provided max A for changing brief and pants 2/2 urine incontinence utilizing Stedy. Patient followed basic commands to execute peri care process with min A verbal cues. Pt was returned to wheelchair for remainder of session with focus on language goals. Pt communicated functional needs through responding to yes/no questions with mod A through verbal response "yeah" and head shake for "no." Pt with varying intonation for "no" responses but continues to verbalize "yeah" which can result in need for clarification through multimodal means (additional gestures, communication board). SLP facilitated spoken and written word comprehension through sentence completion by object function (you write with  a ___). Filled selected answer by pointing to field of 3 written choices with mod-to-max A verbal cues (verbal repetition), visual (written choices) to achieve 60% accuracy. SLP also facilitated verbalization tasks with max A verbal/visual cues to imitate vowel sounds: "ah", "ooo", "oh"; consonants: /m/, /n/, and approximations of /b/, and /l/. Pt imitated/produced with more ease and accuracy today. Pt unable to repeat at the word level, though continues to spontaneously produce common responses (okay, nope) with 100% accuracy in accurate context. SLP facilitated singing of happy birthday. Pt unable to verbalize however hummed on command, however intonation pattern did not match song. Pt was returned to room at end of session and left in wheelchair with alarm activated and immediate needs within reach at end of session. Continue per current plan of care.   ?   ?Pain ? None ? ?Therapy/Group: Individual Therapy ? ?Destenee Guerry T Shizuo Biskup ?02/23/2022, 4:07 PM ?

## 2022-02-23 NOTE — Progress Notes (Signed)
?                                                       PROGRESS NOTE ? ? ?Subjective/Complaints: ? ?No issues overnite per RN, feeding with supervision, no cough drinking liq and taking pills ok  ?ROS-  ?Limited by aphasia ? ?Objective: ?  ?No results found. ?Recent Labs  ?  02/22/22 ?0609  ?WBC 8.8  ?HGB 15.1  ?HCT 43.5  ?PLT 222  ? ? ?Recent Labs  ?  02/21/22 ?0636 02/22/22 ?[id number]  ?NA 137 137  ?K 4.1 4.1  ?CL 105 106  ?CO2 24 24  ?GLUCOSE 152* 142*  ?BUN 23* 25*  ?CREATININE 1.01 1.06  ?CALCIUM 8.7* 8.5*  ? ? ? ?Intake/Output Summary (Last 24 hours) at 02/23/2022 0757 ?Last data filed at 02/22/2022 1830 ?Gross per 24 hour  ?Intake 720 ml  ?Output --  ?Net 720 ml  ? ?  ? ?  ? ?Physical Exam: ?Vital Signs ?Blood pressure 107/78, pulse 83, temperature 98.3 ?F (36.8 ?C), temperature source Oral, resp. rate 14, height 5\' 8"  (1.727 m), weight 106 kg, SpO2 94 %. ? ? ?General: No acute distress ?Mood and affect are appropriate ?Heart: Regular rate and rhythm no rubs murmurs or extra sounds ?Lungs: Clear to auscultation, breathing unlabored, no rales or wheezes ?Abdomen: Positive bowel sounds, soft nontender to palpation, nondistended ?Extremities: No clubbing, cyanosis, or edema ?Skin: No evidence of breakdown, maculopapular rash antecubital fossa  ? ?Neurologic: Cranial nerves II through XII intact, motor strength is 5/5 in left and 0/5 RIght deltoid, bicep, tricep, grip, hip flexor, knee extensors, ankle dorsiflexor and plantar flexor ?Trace Right hip add ?Global aphaisia unable to follow commands including eye closing, will move L arm up with gestural cues but perseverates on this motion  ?Musculoskeletal: Full range of motion in all 4 extremities. No joint swelling ? ? ? ?Assessment/Plan: ?1. Functional deficits which require 3+ hours per day of interdisciplinary therapy in a comprehensive inpatient rehab setting. ?Physiatrist is providing close team supervision and 24 hour management of active medical problems listed  below. ?Physiatrist and rehab team continue to assess barriers to discharge/monitor patient progress toward functional and medical goals ? ?Care Tool: ? ?Bathing ?   ?Body parts bathed by patient: Right arm, Chest, Abdomen, Front perineal area, Buttocks, Face, Right upper leg, Left upper leg  ? Body parts bathed by helper: Left arm ?  ?  ?Bathing assist Assist Level: 2 Helpers (MOD A +2) ?  ?  ?Upper Body Dressing/Undressing ?Upper body dressing   ?What is the patient wearing?: [place] only ?   ?Upper body assist Assist Level: Minimal Assistance - Patient > 75% ?   ?Lower Body Dressing/Undressing ?Lower body dressing ? ? ?   ?What is the patient wearing?: Incontinence brief ? ?  ? ?Lower body assist Assist for lower body dressing: Dependent - Patient 0% ?   ? ?Toileting ?Toileting Toileting Activity did not occur (Probation officer and hygiene only): N/A (no void or bm)  ?Toileting assist Assist for toileting: Dependent - Patient 0% ?  ?  ?Transfers ?Chair/bed transfer ? ?Transfers assist ?   ? ?Chair/bed transfer assist level: Minimal Assistance - Patient > 75% (level surface, multiple scoots) ?  ?  ?Locomotion ?Ambulation ? ? ?Ambulation assist ? ? Ambulation  activity did not occur: Safety/medical concerns ? ?Assist level: 2 helpers (max A of 1 and +2 w/c follow) ?Assistive device: Other (comment) (L hallway rail) ?Max distance: 93ft  ? ?Walk 10 feet activity ? ? ?Assist ? Walk 10 feet activity did not occur: Safety/medical concerns ? ?  ?   ? ?Walk 50 feet activity ? ? ?Assist Walk 50 feet with 2 turns activity did not occur: Safety/medical concerns ? ?  ?   ? ? ?Walk 150 feet activity ? ? ?Assist Walk 150 feet activity did not occur: Safety/medical concerns ? ?  ?  ?  ? ?Walk 10 feet on uneven surface  ?activity ? ? ?Assist Walk 10 feet on uneven surfaces activity did not occur: Safety/medical concerns ? ? ?  ?   ? ?Wheelchair ? ? ? ? ?Assist Is the patient using a wheelchair?: Yes (Per PT long-term  goals) ?Type of Wheelchair: Manual ?Wheelchair activity did not occur: Safety/medical concerns ? ?  ?   ? ? ?Wheelchair 50 feet with 2 turns activity ? ? ? ?Assist ? ?  ?Wheelchair 50 feet with 2 turns activity did not occur: Safety/medical concerns ? ? ?   ? ?Wheelchair 150 feet activity  ? ? ? ?Assist ? Wheelchair 150 feet activity did not occur: Safety/medical concerns ? ? ?   ? ?Blood pressure 107/78, pulse 83, temperature 98.3 ?F (36.8 ?C), temperature source Oral, resp. rate 14, height 5\' 8"  (1.727 m), weight 106 kg, SpO2 94 %. ? ?Medical Problem List and Plan: ?1. 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            -patient may not shower ?            -ELOS/Goals: 21-23 days ?            Continue CIR- PT, OT and SLP - team conf in am  ?2.  Antithrombotics: ?-DVT/anticoagulation:  Pharmaceutical: Lovenox added as 5 days out from [facility] ?            -antiplatelet therapy:  on ASA/Brillinta ?3. Pain Management: Tylenol prn.  ?4. Mood: LCSW to follow for evaluation and support when appropriate.  ?            -antipsychotic agents: N/A ?5. Neuropsych: This patient is not capable of making decisions on his own behalf. ?6. Skin/Wound Care: Routine pressure relief measures.  ?7. Fluids/Electrolytes/Nutrition: Will monitor I/O. CMET ok except low alb  ?8. Hyponatremia: Stable. Monitor with serial checks.  ?9. Prediabetes: Hgb A1C-6.0. fair control  ?-CBG (last 3)  ?Recent Labs  ?  02/22/22 ?1658 02/22/22 ?2118 02/23/22 ?0604  ?GLUCAP 130* 180* 128*  ? ?Controlled 4/25 ? ?10. Dysphagia: D3 thin liq Will change tube feeds to 10 hours from 6 pm to 4 am to help stimulate appetite.   d/c NG tube ?          eating 75-100% meals  ?11. Leucocytosis: resolved 4/24 ?         ? ?  Latest Ref Rng & Units 02/22/2022  ?  6:09 AM 02/19/2022  ?  5:27 AM 02/17/2022  ?  3:00 AM  ?CBC  ?WBC 4.0 - 10.5 K/uL 8.8   8.7   10.6    ?Hemoglobin 13.0 - 17.0 g/dL 15.1   15.8   16.1    ?Hematocrit 39.0 - 52.0 % 43.5   45.3   45.2    ?Platelets  150 - 400 K/uL 222  186   196    ?  ?12. Elevated LDL: continue Lipitor. ?13.  AKI: resolved 4/25 ?  ? ?  Latest Ref Rng & Units 02/22/2022  ?  6:09 AM 02/21/2022  ?  6:36 AM 02/19/2022  ?  5:27 AM  ?BMP  ?Glucose 70 - 99 mg/dL 142   152   126    ?BUN 6 - 20 mg/dL 25   23   24     ?Creatinine 0.61 - 1.24 mg/dL 1.06   1.01   1.11    ?Sodium 135 - 145 mmol/L 137   137   135    ?Potassium 3.5 - 5.1 mmol/L 4.1   4.1   4.1    ?Chloride 98 - 111 mmol/L 106   105   103    ?CO2 22 - 32 mmol/L 24   24   23     ?Calcium 8.9 - 10.3 mg/dL 8.5   8.7   8.8    ? ? ?14. Hypocalcemia:  Ionized Ca- 1.11 with normal albumin-4.1.  ?--Add calcium supplement bid.  ?15. Elevated Homocysteine levels:  B12, folate WNL--per Dr. Erlinda Hong , neuro, who felt no treatment needed as less than 50.  ?16. Obesity: BMI 35.53- provide dietary education. ? 17. Rash- chest and arms ? At this point just at RIght antecubital fossa 4/25 ? ? ? ?LOS: ?5 days ?A FACE TO FACE EVALUATION WAS PERFORMED ? ?Luanna Salk [place] ?02/23/2022, 7:57 AM  ? ? ? ?

## 2022-02-24 LAB — GLUCOSE, CAPILLARY
Glucose-Capillary: 141 mg/dL — ABNORMAL HIGH (ref 70–99)
Glucose-Capillary: 163 mg/dL — ABNORMAL HIGH (ref 70–99)
Glucose-Capillary: 103 mg/dL — ABNORMAL HIGH (ref 70–99)
Glucose-Capillary: 117 mg/dL — ABNORMAL HIGH (ref 70–99)

## 2022-02-24 NOTE — Progress Notes (Signed)
?                                                       PROGRESS NOTE ? ? ?Subjective/Complaints: ? ?Remains aphasic but alert, Y/N verbal accuracy improving  ?ROS-  ?Limited by aphasia ? ?Objective: ?  ?No results found. ?Recent Labs  ?  02/22/22 ?0609  ?WBC 8.8  ?HGB 15.1  ?HCT 43.5  ?PLT 222  ? ? ?Recent Labs  ?  02/22/22 ?0609  ?NA 137  ?K 4.1  ?CL 106  ?CO2 24  ?GLUCOSE 142*  ?BUN 25*  ?CREATININE 1.06  ?CALCIUM 8.5*  ? ? ? ?Intake/Output Summary (Last 24 hours) at 02/24/2022 0857 ?Last data filed at 02/24/2022 1308 ?Gross per 24 hour  ?Intake 478 ml  ?Output 200 ml  ?Net 278 ml  ? ?  ? ?  ? ?Physical Exam: ?Vital Signs ?Blood pressure 109/71, pulse 91, temperature 98.1 ?F (36.7 ?C), temperature source Oral, resp. rate 17, height 5' 8"  (1.727 m), weight 106 kg, SpO2 95 %. ? ? ?General: No acute distress ?Mood and affect are appropriate ?Heart: Regular rate and rhythm no rubs murmurs or extra sounds ?Lungs: Clear to auscultation, breathing unlabored, no rales or wheezes ?Abdomen: Positive bowel sounds, soft nontender to palpation, nondistended ?Extremities: No clubbing, cyanosis, or edema ?Skin: No evidence of breakdown, maculopapular rash antecubital fossa  ? ?Neurologic: Cranial nerves II through XII intact, motor strength is 5/5 in left and 0/5 RIght deltoid, bicep, tricep, grip, hip flexor, knee extensors, ankle dorsiflexor and plantar flexor ?Trace Right hip add ?Global aphaisia unable to follow commands including eye closing, will move L arm up with gestural cues but perseverates on this motion  ?Musculoskeletal: Full range of motion in all 4 extremities. No joint swelling ? ? ? ?Assessment/Plan: ?1. Functional deficits which require 3+ hours per day of interdisciplinary therapy in a comprehensive inpatient rehab setting. ?Physiatrist is providing close team supervision and 24 hour management of active medical problems listed below. ?Physiatrist and rehab team continue to assess barriers to  discharge/monitor patient progress toward functional and medical goals ? ?Care Tool: ? ?Bathing ?   ?Body parts bathed by patient: Right arm, Chest, Abdomen, Front perineal area, Buttocks, Right upper leg, Left upper leg, Face  ? Body parts bathed by helper: Left arm ?  ?  ?Bathing assist Assist Level: 2 Helpers (MODA +2) ?  ?  ?Upper Body Dressing/Undressing ?Upper body dressing   ?What is the patient wearing?: Pull over shirt ?   ?Upper body assist Assist Level: Minimal Assistance - Patient > 75% ?   ?Lower Body Dressing/Undressing ?Lower body dressing ? ? ?   ?What is the patient wearing?: Underwear/pull up, Pants ? ?  ? ?Lower body assist Assist for lower body dressing: 2 Helpers (MOD A +2) ?   ? ?Toileting ?Toileting Toileting Activity did not occur (Probation officer and hygiene only): N/A (no void or bm)  ?Toileting assist Assist for toileting: Dependent - Patient 0% ?  ?  ?Transfers ?Chair/bed transfer ? ?Transfers assist ?   ? ?Chair/bed transfer assist level: Moderate Assistance - Patient 50 - 74% (squat pivot) ?  ?  ?Locomotion ?Ambulation ? ? ?Ambulation assist ? ? Ambulation activity did not occur: Safety/medical concerns ? ?Assist level: 2 helpers ?Assistive device: Lite Gait ?Max distance:  146f  ? ?Walk 10 feet activity ? ? ?Assist ? Walk 10 feet activity did not occur: Safety/medical concerns ? ?  ?   ? ?Walk 50 feet activity ? ? ?Assist Walk 50 feet with 2 turns activity did not occur: Safety/medical concerns ? ?  ?   ? ? ?Walk 150 feet activity ? ? ?Assist Walk 150 feet activity did not occur: Safety/medical concerns ? ?  ?  ?  ? ?Walk 10 feet on uneven surface  ?activity ? ? ?Assist Walk 10 feet on uneven surfaces activity did not occur: Safety/medical concerns ? ? ?  ?   ? ?Wheelchair ? ? ? ? ?Assist Is the patient using a wheelchair?: Yes (Per PT long-term goals) ?Type of Wheelchair: Manual ?Wheelchair activity did not occur: Safety/medical concerns ? ?  ?   ? ? ?Wheelchair 50 feet with 2  turns activity ? ? ? ?Assist ? ?  ?Wheelchair 50 feet with 2 turns activity did not occur: Safety/medical concerns ? ? ?   ? ?Wheelchair 150 feet activity  ? ? ? ?Assist ? Wheelchair 150 feet activity did not occur: Safety/medical concerns ? ? ?   ? ?Blood pressure 109/71, pulse 91, temperature 98.1 ?F (36.7 ?C), temperature source Oral, resp. rate 17, height 5' 8"  (1.727 m), weight 106 kg, SpO2 95 %. ? ?Medical Problem List and Plan: ?1. 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            -patient may not shower ?            -ELOS/Goals: 21-23 days ?            Continue CIR- PT, OT and SLP - Team conference today please see physician documentation under team conference tab, met with team  to discuss problems,progress, and goals. Formulized individual treatment plan based on medical history, underlying problem and comorbidities. ? ?2.  Antithrombotics: ?-DVT/anticoagulation:  Pharmaceutical: [facility]?            -antiplatelet therapy:  on ASA/Brillinta ?3. Pain Management: Tylenol prn.  ?4. Mood: LCSW to follow for evaluation and support when appropriate.  ?            -antipsychotic agents: N/A ?5. Neuropsych: This patient is not capable of making decisions on his own behalf. ?6. Skin/Wound Care: Routine pressure relief measures.  ?7. Fluids/Electrolytes/Nutrition: Will monitor I/O. CMET ok except low alb  ?8. Hyponatremia: Stable. Monitor with serial checks.  ?9. Prediabetes: Hgb A1C-6.0. fair control  ?-CBG (last 3)  ?Recent Labs  ?  02/23/22 ?1844 02/23/22 ?2116 02/24/22 ?05956 ?GLUCAP 203* 172* 141*  ? ?Controlled 4/26 ? ?10. Dysphagia: D3 thin liq  ?          eating 75-100% meals  ?11. Leucocytosis: resolved  ?         ? ?  Latest Ref Rng & Units 02/22/2022  ?  6:09 AM 02/19/2022  ?  5:27 AM 02/17/2022  ?  3:00 AM  ?CBC  ?WBC 4.0 - 10.5 K/uL 8.8   8.7   10.6    ?Hemoglobin 13.0 - 17.0 g/dL 15.1   15.8   16.1    ?Hematocrit 39.0 - 52.0 % 43.5   45.3   45.2    ?Platelets 150 -  400 K/uL 222   186   196    ?  ?12. Elevated LDL: continue Lipitor. ?13.  AKI: resolved although BUN  mildly elevated enc po fluid  ?  ? ?  Latest Ref Rng & Units 02/22/2022  ?  6:09 AM 02/21/2022  ?  6:36 AM 02/19/2022  ?  5:27 AM  ?BMP  ?Glucose 70 - 99 mg/dL 142   152   126    ?BUN 6 - 20 mg/dL 25   23   24     ?Creatinine 0.61 - 1.24 mg/dL 1.06   1.01   1.11    ?Sodium 135 - 145 mmol/L 137   137   135    ?Potassium 3.5 - 5.1 mmol/L 4.1   4.1   4.1    ?Chloride 98 - 111 mmol/L 106   105   103    ?CO2 22 - 32 mmol/L 24   24   23     ?Calcium 8.9 - 10.3 mg/dL 8.5   8.7   8.8    ? ? ?14. Hypocalcemia:  Ionized Ca- 1.11 with normal albumin-4.1.  ?--Add calcium supplement bid.  ?15. Elevated Homocysteine levels:  B12, folate WNL--per Dr. Erlinda Hong , neuro, who felt no treatment needed as less than 50.  ?16. Obesity: BMI 35.53- provide dietary education. ? 17. Rash- chest and arms ? At this point just at RIght antecubital fossa 4/25 ? ? ? ?LOS: ?6 days ?A FACE TO FACE EVALUATION WAS PERFORMED ? ?Luanna Salk Pama Roskos ?02/24/2022, 8:57 AM  ? ? ? ?

## 2022-02-24 NOTE — Patient Care Conference (Signed)
Inpatient RehabilitationTeam Conference and Plan of Care Update ?Date: 02/24/2022   Time: 10:38 AM  ? ? ?Patient Name: Gordon Fisher      ?Medical Record Number: [id number]  ?Date of Birth: 15-Oct-1961 ?Sex: Male         ?Room/Bed: 4M12C/4M12C-01 ?Payor Info: Payor: Advertising copywriter / Plan: Advertising copywriter OTHER / Product Type: *No Product type* /   ? ?Admit Date/Time:  02/18/2022  3:25 PM ? 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 ?Hospital Problems: Principal Problem: ?  Acute ischemic left middle cerebral artery (MCA) stroke (HCC) ? ? ? ?Expected Discharge Date: Expected Discharge Date: 03/01/22 ? ?Team Members Present: ?Physician leading conference: Dr. Claudette Laws ?Social Worker Present: Lavera Guise, BSW ?Nurse Present: Chana Bode, RN ?PT Present: Casimiro Needle, PT ?OT Present: Barron Schmid, COTA;Jennifer Oglala, OT ?SLP Present: Eilene Ghazi, SLP ?PPS Coordinator present : Fae Pippin, SLP ? ?   Current Status/Progress Goal Weekly Team Focus  ?Bowel/Bladder ? ? cont/ incont of B/B LBM 4/25  regain continence  assess q shift and prn   ?Swallow/Nutrition/ Hydration ? ? min A; dysphagia 3 diet, thin liquids  sup A  tolerance of current diet, regular PO trials   ?ADL's ? ? MOD A +2 for bathing from EOB d/t balance and for sit>stand during LB bathing ( can we shower?), MIN A ofr UB dressing, MOD A +2 for LB dresssing via sit>stand, MOD A for squat pivot to w/c, have not attempted toileting d/t condom cath. no active movement in RUE yet, fair attention to RUE during functional tasks, ? apraxia during ADLs such as oral care, difficulty getting pt to spit appropriately. continues to be aphasic but follows commands with ~ 90% accuracy  MIN A- supervision  transfer training, BADL reeducaton, estim?, dynamic sitting/standing balance, RUE NMR   ?Mobility ? ? 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 ? mod A comprehension, max A expression through multimodal means  min A comprehension of basic auditory information; mod-to-max A expression of needs through multimodal communication  verbalizations, automatic speech, reading comprehension, responding to yes/no questions, object/picture ID/matching   ?Safety/Cognition/ Behavioral Observations ? Focus on communciation and swallow goals at this time; difficult to assess secondary to severity of aphasia. Safety awareness appears grossly intact         ?Pain ? ? no complaints of pain  remain pain free  assess q shift and prn   ?Skin ? ?           ? ? ?Discharge Planning:  ?Discharging home with spouse as primary caregiver. Family and friends able to assist some. 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 ? ?Patient on target to meet rehab goals: ?yes, currently needs mod assist for lower body care and min assist for upper body care. Needs  mod assist for stand pivot transfers and mod assist for sit - stand. Using tilt in space for poor trunk control. Goals for discharge set for min assist overall. ? ?*See Care Plan and progress notes for long and short-term goals.  ? ?Revisions to Treatment Plan:  ?Light gait  ?Po trials ?Multi modal communication tools ? ?Teaching Needs: ?Safety, transfers, medications, secondary risk management, etc  ?Current  Barriers to Discharge: ?Decreased caregiver support and Home enviroment access/layout ? ?Possible Resolutions to Barriers: ?Family education ?HH follow up services ?DME: BSC ?  ? ? Medical Summary ?Current Status: po intake improved , Right sided inattention improving ? Barriers to Discharge: Incontinence ?  ?Possible Resolutions to [employer]: work on [employer] , BP controlled , work on trunk control ? ? ?Continued Need for Acute Rehabilitation Level of Care: The patient requires daily medical management by a physician with specialized training in physical medicine and rehabilitation for the following reasons: ?Direction of a multidisciplinary physical rehabilitation program to maximize functional independence : Yes ?Medical management of patient stability for increased activity during participation in an intensive rehabilitation regime.: Yes ?Analysis of laboratory values and/or radiology reports with any subsequent need for medication adjustment and/or medical intervention. : Yes ? ? ?I attest that I was present, lead the team conference, and concur with the assessment and plan of the team. ? ? ?Chana Bode B ?02/24/2022, 3:49 PM  ? ? ? ? ? ? ?

## 2022-02-24 NOTE — Progress Notes (Signed)
Patient ID: Gordon Fisher, male   DOB: 1961/06/30, 61 y.o.   MRN: [id number] ? ?Team Conference Report to Patient/Family ? ?Team Conference discussion was reviewed with the patient and caregiver, including goals, any changes in plan of care and target discharge date.  Patient and caregiver express understanding and are in agreement.  The patient has a target discharge date of 03/19/22. ? ?SW spoke with patient spouse and sister in law and provided team conference updates.Spouse requesting a follow up from physician to address questions in reference to stroke follow up questions. Spouse and sister in law would like to know if they can bring in chopped food items from the home, sw will follow up with SLP. Sw spoke to family in reference of patient VA benefits. Spouse received FU during conversation and patient has been connected with a PCP and Education officer, museum at the [place]. Sw will wait for the [place] SW to connect on available resources. No additional questions or concerns, sw will continue to follow up. ? ?Dyanne Iha ?02/24/2022, 2:27 PM  ?

## 2022-02-24 NOTE — Progress Notes (Signed)
Initial Nutrition Assessment ? ?DOCUMENTATION CODES:  ? ?Obesity unspecified ? ?INTERVENTION:  ? ?Discontinue Ensure Enlive  ?Continue 30 ml ProSource Plus BID, each supplement provides 100 kcals and 15 grams protein.  ?Magic cup BID with meals, each supplement provides 290 kcal and 9 grams of protein ?Multivitamin w/ minerals daily ? ?NUTRITION DIAGNOSIS:  ? ?Inadequate oral intake related to dysphagia as evidenced by meal completion < 25%. - Progressing ? ?GOAL:  ? ?Patient will meet greater than or equal to 90% of their needs - Ongoing ? ?MONITOR:  ? ?PO intake, Supplement acceptance, I & O's ? ?REASON FOR ASSESSMENT:  ? ?Consult ?Calorie Count, Assessment of nutrition requirement/status ? ?ASSESSMENT:  ? ?61 year old male presents with right sided weakness, right facial droop and slurred speech. CTA showed L-MCA MI occlusion and he underwent cerebral angio with thrombectomy. MRI brain done revealing small areas of acute infarct infarct in L-MCA territory with mild hemorrhagic transformation in left parietal lobe and L-MCA bifurcation residual vascular thrombus. Post op CT showed small SAH. Patient with resultant global aphasia. 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 Admitted to CIR. ? ?04/24 - Cortrak removed  ? ?Pt sitting in room, wife at bedside.  ?Reports that pt is eating better. Denies any nausea or vomiting. Pt is not drinking Ensure's and is willing to try Magic Cup supplement instead.  ? ?Per EMR, pt intake includes:  ?4/25: Breakfast 100%, Lunch 75%, Dinner 100% ?4/26: Breakfast 80%, Lunch 80% ?4/27 Breakfast 80% ? ?Medications reviewed and include: Calcium Carbonate, SSI 0-5 units daily + 0-9 units TID, Protonix, Miralax ?Labs reviewed. ? ?Diet Order:   ?Diet Order   ? ?       ?  DIET DYS 3 Room service appropriate? Yes; Fluid consistency: Thin  Diet effective now       ?  ? ?  ?  ? ?  ? ? ?EDUCATION NEEDS:  ? ?No education needs have been identified at this time ? ?Skin:  Skin Assessment:  Reviewed RN Assessment ? ?Last BM:  4/26 ? ?Height:  ? ?Ht Readings from Last 1 Encounters:  ?02/23/22 5\' 8"  (1.727 m)  ? ? ?Weight:  ? ?Wt Readings from Last 1 Encounters:  ?02/23/22 106 kg  ? ?BMI:  Body mass index is 35.53 kg/m?. ? ?Estimated Nutritional Needs:  ? ?Kcal:  2200-2400 ? ?Protein:  110-125 grams ? ?Fluid:  >/= 2 L/day ? ? ?02/25/22 RD, LDN ?Clinical Dietitian ?See AMiON for contact information.  ? ?

## 2022-02-24 NOTE — Progress Notes (Signed)
Speech Language Pathology Daily Session Note ? ?Patient Details  ?Name: [name] ?MRN: [id number] ?Date of Birth: [birth year] ? ?Today's Date: 02/24/2022 ?SLP Individual Time: [id number] ?SLP Individual Time Calculation (min): 57 min ? ?Short Term Goals: ?Week 1: SLP Short Term Goal 1 (Week 1): Patient will consume current diet with minimal s/s of aspiration with with min A cues to implement safe swallowing strategies/precautions (slow rate, small bites/sips) ?SLP Short Term Goal 2 (Week 1): Patient will respond to yes/no questions via multimodal means with max A multimodal cues to achieve 60% accuracy ?SLP Short Term Goal 3 (Week 1): Patient will ID field of 3 words with mod A multimodal cues to achieve 60% accuracy ?SLP Short Term Goal 4 (Week 1): Pt will ID field of 3 objects with mod A multimodal cues to achieve 50% accuracy ?SLP Short Term Goal 5 (Week 1): Patient complete object-to-word matching with max A multimodal cues to achieve 50% accuracy ?SLP Short Term Goal 6 (Week 1): Patient will verbalize "yeah" and "no" accuratly in response to yes/no questions with max A multimodal cues during 2/10 attempts. ? ?Skilled Therapeutic Interventions: Skilled ST treatment focused on communication goals. SLP facilitated session by providing mod A for using Copy and Recall Treatment (CART) approach for copying and writing functional words including "[name]", "[name]" (wife), "Janett Billow" (Daughter). Pt was unable to initiate written communication at word level on command, however 90% accurate by directly copying written stimuli, and 70% accurate when copying written word without visual stimuli. Pt attempted to write spouse's name without stimuli however incorrect spelling (I.e., Leeis). Pt also known to perseverate on "Leeis" with all other attempts. SLP educated pt/family on ways to practice using Copy and Recall elements on dry erase board with functional, commonly used words. Spouse verbalized understanding through teach  back. SLP also facilitated verbal communication through imitating sounds: "ah", "ooo", "oh", "ee", /m/, /k/ with max A verbal/visual/tactile cues and multiple (~8-10) attempts. Pt unable to produce other sounds and perseverated on "ah". During this process, pt spontaneously elicited "I can't." Encouragement provided throughout. SLP provided education on aphasia/apraxia and recovery. Throughout discussion pt nodded head, stated "yeah" and "oh" at appropriate times and various facial expressions. Will also provide further aphasia education with patient's family. Patient was left in wheelchair with alarm activated and immediate needs within reach at end of session. Continue per current plan of care.   ?   ?Pain ? None ? ?Therapy/Group: Individual Therapy ? ?Kismet Facemire T Charma Mocarski ?02/24/2022, 3:06 PM ?

## 2022-02-24 NOTE — Progress Notes (Signed)
Occupational Therapy Session Note ? ?Patient Details  ?Name: Gordon Fisher ?MRN: [id number] ?Date of Birth: [birth year] ? ?Today's Date: 02/24/2022 ?OT Individual Time: [id number] ?OT Individual Time Calculation (min): 77 min  ? ? ?Short Term Goals: ?Week 1:  OT Short Term Goal 1 (Week 1): Pt will maintain static sitting balance EOB with min A >5 min. ?OT Short Term Goal 2 (Week 1): Pt will complete toilet transfer with max A of 2 and no lift. ?OT Short Term Goal 3 (Week 1): Pt will don shirt in support sitting with min A. ?OT Short Term Goal 4 (Week 1): Pt will position RUE on pillow with max A. ? ?Skilled Therapeutic Interventions/Progress Updates:  ?Pt greeted supine in bed  agreeable to OT intervention. Session focus on BADL reeducation, functional mobility, RUE NMR, dynamic standing/sit>stands balance and decreasing overall caregiver burden.  ?Pt completed supine>sit with MODA with increased time and effort with pt exiting to R side of bed. Pt able to sit EOB for bathing tasks with intermittent MIN A during dynamic tasks. Pt completed bathing with overall MOD A +2 needign most assist during sit>stand while pt washed LB. Noted some apraxia with LB bathing with pt needing initial hand over hand assist to get started with anterior periarea bathing from standing. Pt donned [place] shirt with MIN A needed assist to thread RUE, education provided to pt and husband on hemi techniques. Pt completed LB dressing with MOD A +2 via sit>stand needing assist to thread RLE but able to thread LLE with MIN A for balance. Sit>stand with MOD A +2 while pt pulled pants up to waist line. Pt completed squat pivot to TIS with MOD A to L side. ? Pt transported to gym with total A where pt completed various therapeutic activities focused on RUE/RLE NMR. Pt completed lateral leans to RUE from EOM overall MIN A for NMR, pt needed initial assist to initiate elevating trunk back to sitting but able to return to midline while pushing through RUE  with overall MINA. Graded task up and had pt reach for objects with LUE on R side to facilitate further WB on RUE. ?Pt completed x5 sit>stands from EOM with MOD A with RLE blocked and RUE tucked into gait belt. Worked on command following with pt asked to reach dynamically with LUE to retrieve various tools placed on pts R side in standing and sitting with MOD A +2 for standing and MIN A for sitting. Pt completed task with ~ 80% accuracy with choices of wrench, screwdriver and socket wrench provided.  ?Attempted to use arm scooter to promote AAROM with no initiation noted, pt needed total A from OTA to promote scapular protraction/retraction. Pt transported back to room with total A where pt able to choose from 2 choices provided written on white board such as " is it 2003 or 2023." Pt even able to intiially trace name and then write name with LUE.                pt left  up in w/c with alarm belt activated and all needs within reach, SIL present working on picking choices from whiteboard.                 ? ? ?Therapy Documentation ?Precautions:  ?Precautions ?Precautions: Fall, Other (comment) ?Precaution Comments: R hemi, global aphasia, cortrak ?Restrictions ?Weight Bearing Restrictions: No ? ?Pain: pt gesturing to RUE and RLE as locations of pain, rest breaks provided as needed.  ? ? ? ?  Therapy/Group: Individual Therapy ? ?[name] ?02/24/2022, 2:01 PM ?

## 2022-02-24 NOTE — Progress Notes (Signed)
Physical Therapy Session Note ? ?Patient Details  ?Name: Gordon Fisher ?MRN: [id number] ?Date of Birth: 1960/11/19 ? ?Today's Date: 02/24/2022 ?PT Individual Time: 1606-1700 ?PT Individual Time Calculation (min): 54 min  ? ?Short Term Goals: ?Week 1:  PT Short Term Goal 1 (Week 1): Pt will transfer sup <> sit w/ mod A ?PT Short Term Goal 2 (Week 1): Pt will transfer sit to stand w/ Max A from lower surface. ?PT Short Term Goal 3 (Week 1): Pt will perform SPT w/ max A ? ?Skilled Therapeutic Interventions/Progress Updates:  ?  Pt received supine in bed with his wife, [name], present and pt agreeable to therapy session. Pt continues to have global aphasia (expressive more impaired than receptive) responding inconsistently to verbal yes/no responses, requiring written communication for increased accuracy. Pt continues to demonstrate R inattention. Donned shoes in supine total assist. Supine>sitting R EOB, HOB flat but using UE support, with heavy mod assist of 1 for R hemibody management and trunk upright. L squat pivot EOB>TIS w/c with mod assist of 1 for lifting/pivoting hips and managing R LE foot position during transfer to promote WBing for NMR as well as protect ankle.  Transported to/from gym in w/c for time management and energy conservation. ? ?Donned R LE ankle DF assist ACE wrap and leg lifter to facilitate improved gait mechanics. ? ?Sit>stand TIS w/c>L UE support on litegait with light mod assist of 1 for guarding/blocking R knee buckle - pt comes to stand with closer to heavy min assist but while standing requires progressively increased mod assist due to R knee starting to flex and impaired midline orientation with R lateral trunk lean while +2 assist donned litegait harness. ? ?Gait training ~119ft in litegait harness providing partial BWS (body weight support) with heavy mod assist of 1 for R LE gait mechanics and +2 managing the litegait - therapist utilized ACE wrap to support R hand on litegait handle.  Pt requires the following cuing/manual facilitation:  ?- requires max assist to weight shift L onto L stance limb due to heavy R lean/pushing ?- requires total assist to advance R LE during swing phase using leg lifter to facilitate ?- pt with poor R quad activation during stance with knee maintained in partial flexion  ?- facilitated increased R weight shift onto R stance limb to promote increased L LE step length as pt primarily performing step-to with L LE - with increased BWS and facilitation for this pt achieves inconsistent reciprocal pattern ? ?Doffed R LE ACE wrap and donned R LE [place] Sprystep Max GRAFO to facilitate ankle DF during swing and promote terminal R knee extension during stance phase.  ? ?Gait training additional ~167ft-160ft in [place] with partial BWS - continues to require the above cuing and manual facilitation - did noticed increased R knee extension during stance along with therapist providing increased manual facilitation; however, pt continues to have shortened R stance time with decreased L LE step length achieving only step-to pattern despite manual facilitation and cuing for reciprocal pattern. ? ?Doffed AFO. Transported back to room and pt requesting to remain sitting up in TIS w/c - left with needs in reach, seat belt alarm on, R UE supported on pillows, and pt's wife present. ? ? ?Therapy Documentation ?Precautions:  ?Precautions ?Precautions: Fall, Other (comment) ?Precaution Comments: R hemi, global aphasia, cortrak ?Restrictions ?Weight Bearing Restrictions: No ? ? ?Pain: ? Continues to have R quad pain with quick knee flexion while in sitting (noticed when therapist  attempts to reposition r LE underneath his BOS prior to standing up). Pt also appears to have possible discomfort or tightness sensation in R LE while wearing AFO - reports relief when doffing brace at end of session. ? ? ? ?Therapy/Group: Individual Therapy ? ?Tawana Scale , PT, DPT, NCS,  CSRS ?02/24/2022, 8:05 AM  ?

## 2022-02-25 LAB — GLUCOSE, CAPILLARY
Glucose-Capillary: 113 mg/dL — ABNORMAL HIGH (ref 70–99)
Glucose-Capillary: 118 mg/dL — ABNORMAL HIGH (ref 70–99)
Glucose-Capillary: 159 mg/dL — ABNORMAL HIGH (ref 70–99)
Glucose-Capillary: 97 mg/dL (ref 70–99)

## 2022-02-25 MED ORDER — ADULT MULTIVITAMIN W/MINERALS CH
1.0000 | ORAL_TABLET | Freq: Every day | ORAL | Status: DC
  Administered 2022-02-25 – 2022-03-19 (×23): 1 via ORAL
  Filled 2022-02-25 (×23): qty 1

## 2022-02-25 NOTE — Progress Notes (Signed)
Physical Therapy Session Note ? ?Patient Details  ?Name: Gordon Fisher ?MRN: [id number] ?Date of Birth: Oct 15, 1961 ? ?Today's Date: 02/25/2022 ?PT Individual Time: 1050-1200 ?PT Individual Time Calculation (min): 70 min  ? ?Short Term Goals: ?Week 1:  PT Short Term Goal 1 (Week 1): Pt will transfer sup <> sit w/ mod A ?PT Short Term Goal 2 (Week 1): Pt will transfer sit to stand w/ Max A from lower surface. ?PT Short Term Goal 3 (Week 1): Pt will perform SPT w/ max A ? ?Skilled Therapeutic Interventions/Progress Updates:  ?  Pt received sitting in TIS w/c and agreeable to therapy session.  Transported to/from gym in w/c for time management and energy conservation. ? ?Attempted to don Matrix Max GRAFO; however, the lateral strut was rubbing against his foot therefore not appropriate to trial.  ? ?Donned [place] Walk-on Reaction GRAFO. Donned leg lifter to use to facilitate swing phase advancement. ? ?Gait training ~87ft x3 at L hallway rail with max assist of 1 and +2 w/c follow ? - pt continues to demonstrate pushing with strong R anterior trunk lean  ?- continues to require total assist to advance R LE during swing  ?- demos increasing R knee extension wearing GRAFO, but it is still present and requires manual facilitation in addition to brace to achieve increased extension ?- continues to perform only step-to pattern leading with R LE despite best efforts to facilitate more prolonged R stance phase time to allow increased L LE step length ? ?Transitioned to gait training 24ft + 91ft x2 with 3 [place] support to challenge balance with pt requiring heavy max assist from therapist on R side to maintain balance due to strong R anterior trunk lean and continuing to provide total assist to advance R LE during swing and manually facilitate increased R hip/knee extension during stance. Pt noticed to have trunk rotated towards R with the associated R anterior trunk lean. Continues to have step-to pattern leading with R  LE despite best efforts of cuing/facilitating for increased L step length due to challenges with aphasia and apraxia.  ? ?Pt noticed to gesture as if needing to urinate. Transported back to room.  ? ?Sit<>stands using stedy with CGA for steadying and min assist for R hemibody management. Stedy transfer in/out bathroom. Standing with CGA during dependent assist LB clothing management - pt noted to have been incontinent of bladder prior to getting to toilet. Dependent assist peri-care.  ? ?Transported back to gym. R squat pivot transfer w/c>EOM with pt demonstrating significant apraxia to perform this task in this novice environment with pt trying to perform more of a stand pivot - mod assist for balance and guarding R LE.  ? ?Sit<>stands to/from EOM with L HHA support from +2 assist with min assist for lifting/balance with R UE support around therapist's shoulders and facilitating increased R hip/knee extension. Mirror feedback provided throughout. Pt demonstrates severe pushing towards R even in static standing balance preventing pt from being able to progress towards NMR to step forward/backwards with L LE towards target. ? ?L squat pivot EOM>w/c with min assist for pivoting hips. Transported back to room and left seated in TIS w/c with needs in reach, seat belt alarm on, R UE supported on pillow, and pt's wife present. ? ?Therapy Documentation ?Precautions:  ?Precautions ?Precautions: Fall, Other (comment) ?Precaution Comments: R hemi, global aphasia, cortrak ?Restrictions ?Weight Bearing Restrictions: No ? ? ?Pain: ? Pt continues to grimace with certain R LE movements demonstrating possible pain/discomfort. ? ? ? ?  Therapy/Group: Individual Therapy ? ?Ginny Forth , PT, DPT, NCS, CSRS ?02/25/2022, 8:00 AM  ?

## 2022-02-25 NOTE — Progress Notes (Signed)
Speech Language Pathology Daily Session Note ? ?Patient Details  ?Name: Gordon Fisher ?MRN: [id number] ?Date of Birth: Feb 23, 1961 ? ?Today's Date: 02/25/2022 ?SLP Individual Time: 1400-1450 ?SLP Individual Time Calculation (min): 50 min ? ?Short Term Goals: ?Week 1: SLP Short Term Goal 1 (Week 1): Patient will consume current diet with minimal s/s of aspiration with with min A cues to implement safe swallowing strategies/precautions (slow rate, small bites/sips) ?SLP Short Term Goal 2 (Week 1): Patient will respond to yes/no questions via multimodal means with max A multimodal cues to achieve 60% accuracy ?SLP Short Term Goal 3 (Week 1): Patient will ID field of 3 words with mod A multimodal cues to achieve 60% accuracy ?SLP Short Term Goal 4 (Week 1): Pt will ID field of 3 objects with mod A multimodal cues to achieve 50% accuracy ?SLP Short Term Goal 5 (Week 1): Patient complete object-to-word matching with max A multimodal cues to achieve 50% accuracy ?SLP Short Term Goal 6 (Week 1): Patient will verbalize "yeah" and "no" accuratly in response to yes/no questions with max A multimodal cues during 2/10 attempts. ? ?Skilled Therapeutic Interventions: Skilled ST treatment focused on communication goals. SLP initiated session by giving patient field of 3 written choices to choose order of focus during today's session (i.e., speech, reading, writing). SLP facilitated speech tasks by providing overall max A multimodal cues (verbal repetition, articulatory placement, intoning, tactile cues) for production of vowels "ooo", "eee", "uh", "ah", "oh"; consonants: /w/, /l/, /n/, /r/, /y/, /m/, /k/. 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 Pt was successful with producing /m/ + "uh" prior to target consonant in word final position (i.e., muck, mun). This allowed pt to eventually verbalize his name. Pt initially with distorted vowel ("mack") however eventually able to correct. Pt also benefited from  carrier phrase spoken by SLP + gesture and expectant look (i.e., "my name is ___"). Pt and spouse were delighted by this progress. SLP facilitated reading tasks by providing min A verbal clarification cues to identify target by feature in written field of 3 (e.g., which one do you eat, which is furniture) to achieve 80% accuracy. Pt identified in field of 10 numbers with 90% accuracy. Unable to focus on writing tasks today due to focus on verbal/speech/writing tasks. Patient was left in wheelchair with alarm activated and immediate needs within reach at end of session. Pt accompanied by nurse and spouse. Continue per current plan of care.   ?   ?Pain ?Pain Assessment ?Pain Scale: 0-10 ?Pain Score: 0-No pain ? ?Therapy/Group: Individual Therapy ? ?Shed Nixon T Leobardo Granlund ?02/25/2022, 4:30 PM ?

## 2022-02-25 NOTE — Progress Notes (Signed)
Notified on call provider Dois Davenport, [place] about wife concern about  patient having little more facial drooping on right side. VSS and patient denies any distress. Assured wife that patient will be monitored and will pass on to next nurse. Will continue to monitor. ?

## 2022-02-25 NOTE — Progress Notes (Signed)
Occupational Therapy Session Note ? ?Patient Details  ?Name: Gordon Fisher ?MRN: [id number] ?Date of Birth: 09/13/1961 ? ?Today's Date: 02/25/2022 ?OT Individual Time: 0109-3235 ?OT Individual Time Calculation (min): 57 min  ? ? ?Short Term Goals: ?Week 1:  OT Short Term Goal 1 (Week 1): Pt will maintain static sitting balance EOB with min A >5 min. ?OT Short Term Goal 2 (Week 1): Pt will complete toilet transfer with max A of 2 and no lift. ?OT Short Term Goal 3 (Week 1): Pt will don shirt in support sitting with min A. ?OT Short Term Goal 4 (Week 1): Pt will position RUE on pillow with max A. ? ?Skilled Therapeutic Interventions/Progress Updates:  ?Pt greeted supine in bed  agreeable to OT intervention. Session focus on BADL reeducation, functional mobility, dynamic standing balance and decreasing overall caregiver burden.     ?Pt completed supine>sit to R side of bed with MOD A needing assist to maneuver RLE to EOM, light assist needed to elevate trunk into sitting.  Used stedy for first shower transport, MOD A to stand to stedy, total A transport into shower. Pt completed bathing with overall MAX A +1 d/t apaxia during bathing tasks with pt needing +time and effort to process commands in novel environment related to bathing such as "wash your stomach." Pt exited shower with stedy with pt standing with MODA. Pt completed dressing from EOB with MOD A for UB dressing as pt needed assist to thread RUE in shirt and MIN verbal/tactile cues needed to problem solve next steps. Pt completed LB dressing with MODA  +2 needing assist for sit>stand and assist to pull pants up on R side. Pt completed squat pivot to w/c with MOD A to L side. Pt completed oral care at sink with rehab tech/wife while this OTA retrieved new head rest as current head rest pushing neck too far forward.       pt left  reclined in TIS with alarm belt activated and all needs within reach.                    ?Therapy Documentation ?Precautions:   ?Precautions ?Precautions: Fall, Other (comment) ?Precaution Comments: R hemi, global aphasia, cortrak ?Restrictions ?Weight Bearing Restrictions: No ? ?Pain: pt grimacing to pain in RUE, rest breaks and repositioning provided as needed.  ? ? ? ?Therapy/Group: Individual Therapy ? ?Barron Schmid ?02/25/2022, 10:03 AM ?

## 2022-02-25 NOTE — Progress Notes (Signed)
?                                                       PROGRESS NOTE ? ? ?Subjective/Complaints: ? ?Smiling , aphasic  ?ROS-  ?Limited by aphasia ? ?Objective: ?  ?No results found. ?No results for input(s): WBC, HGB, HCT, PLT in the last 72 hours. ? ?No results for input(s): NA, K, CL, CO2, GLUCOSE, BUN, CREATININE, CALCIUM in the last 72 hours. ? ? ?Intake/Output Summary (Last 24 hours) at 02/25/2022 0817 ?Last data filed at 02/24/2022 2000 ?Gross per 24 hour  ?Intake 830 ml  ?Output --  ?Net 830 ml  ? ?  ? ?  ? ?Physical Exam: ?Vital Signs ?Blood pressure 106/87, pulse 77, temperature 98.2 ?F (36.8 ?C), resp. rate 16, height 5\' 8"  (1.727 m), weight 106 kg, SpO2 98 %. ? ? ?General: No acute distress ?Mood and affect are appropriate ?Heart: Regular rate and rhythm no rubs murmurs or extra sounds ?Lungs: Clear to auscultation, breathing unlabored, no rales or wheezes ?Abdomen: Positive bowel sounds, soft nontender to palpation, nondistended ?Extremities: No clubbing, cyanosis, or edema ?Skin: No evidence of breakdown, maculopapular rash antecubital fossa  ? ?Neurologic: Cranial nerves II through XII intact, motor strength is 5/5 in left and 0/5 RIght deltoid, bicep, tricep, grip, hip flexor, knee extensors, ankle dorsiflexor and plantar flexor ?Trace Right hip add ?Global aphaisia unable to follow commands including eye closing, will move L arm up with gestural cues but perseverates on this motion  ?Musculoskeletal: Full range of motion in all 4 extremities. No joint swelling ? ? ? ?Assessment/Plan: ?1. Functional deficits which require 3+ hours per day of interdisciplinary therapy in a comprehensive inpatient rehab setting. ?Physiatrist is providing close team supervision and 24 hour management of active medical problems listed below. ?Physiatrist and rehab team continue to assess barriers to discharge/monitor patient progress toward functional and medical goals ? ?Care Tool: ? ?Bathing ?   ?Body parts bathed by  patient: Right arm, Chest, Abdomen, Front perineal area, Buttocks, Right upper leg, Left upper leg, Face  ? Body parts bathed by helper: Left arm ?  ?  ?Bathing assist Assist Level: 2 Helpers (MODA +2) ?  ?  ?Upper Body Dressing/Undressing ?Upper body dressing   ?What is the patient wearing?: Pull over shirt ?   ?Upper body assist Assist Level: Minimal Assistance - Patient > 75% ?   ?Lower Body Dressing/Undressing ?Lower body dressing ? ? ?   ?What is the patient wearing?: Pants, Underwear/pull up ? ?  ? ?Lower body assist Assist for lower body dressing: 2 Helpers (MOD A +2) ?   ? ?Toileting ?Toileting Toileting Activity did not occur (Probation officer and hygiene only): N/A (no void or bm)  ?Toileting assist Assist for toileting: Dependent - Patient 0% ?  ?  ?Transfers ?Chair/bed transfer ? ?Transfers assist ?   ? ?Chair/bed transfer assist level: Moderate Assistance - Patient 50 - 74% (squat pivot) ?  ?  ?Locomotion ?Ambulation ? ? ?Ambulation assist ? ? Ambulation activity did not occur: Safety/medical concerns ? ?Assist level: 2 helpers ?Assistive device: Lite Gait ?Max distance: 125ft  ? ?Walk 10 feet activity ? ? ?Assist ? Walk 10 feet activity did not occur: Safety/medical concerns ? ?  ?   ? ?Walk 50 feet activity ? ? ?  Assist Walk 50 feet with 2 turns activity did not occur: Safety/medical concerns ? ?  ?   ? ? ?Walk 150 feet activity ? ? ?Assist Walk 150 feet activity did not occur: Safety/medical concerns ? ?  ?  ?  ? ?Walk 10 feet on uneven surface  ?activity ? ? ?Assist Walk 10 feet on uneven surfaces activity did not occur: Safety/medical concerns ? ? ?  ?   ? ?Wheelchair ? ? ? ? ?Assist Is the patient using a wheelchair?: Yes (Per PT long-term goals) ?Type of Wheelchair: Manual ?Wheelchair activity did not occur: Safety/medical concerns ? ?  ?   ? ? ?Wheelchair 50 feet with 2 turns activity ? ? ? ?Assist ? ?  ?Wheelchair 50 feet with 2 turns activity did not occur: Safety/medical concerns ? ? ?    ? ?Wheelchair 150 feet activity  ? ? ? ?Assist ? Wheelchair 150 feet activity did not occur: Safety/medical concerns ? ? ?   ? ?Blood pressure 106/87, pulse 77, temperature 98.2 ?F (36.8 ?C), resp. rate 16, height 5\' 8"  (1.727 m), weight 106 kg, SpO2 98 %. ? ?Medical Problem List and Plan: ?1. 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            -patient may not shower ?            -ELOS/Goals: 5/19 min/mod A ?            Continue CIR- PT, OT and SLP - ?2.  Antithrombotics: ?-DVT/anticoagulation:  Pharmaceutical: Lovenox added as 5 days out from [facility] ?            -antiplatelet therapy:  on ASA/Brillinta ?3. Pain Management: Tylenol prn.  ?4. Mood: LCSW to follow for evaluation and support when appropriate.  ?            -antipsychotic agents: N/A ?5. Neuropsych: This patient is not capable of making decisions on his own behalf. ?6. Skin/Wound Care: Routine pressure relief measures.  ?7. Fluids/Electrolytes/Nutrition: Will monitor I/O. CMET ok except low alb  ?8. Hyponatremia: Stable. Monitor with serial checks.  ?9. Prediabetes: Hgb A1C-6.0. fair control  ?-CBG (last 3)  ?Recent Labs  ?  02/24/22 ?1709 02/24/22 ?2115 02/25/22 ?0601  ?GLUCAP 103* 117* 113*  ? ?Controlled 4/27 ? ?10. Dysphagia: D3 thin liq  ?          eating 75-100% meals  ?11. Leucocytosis: resolved  ?         ? ?  Latest Ref Rng & Units 02/22/2022  ?  6:09 AM 02/19/2022  ?  5:27 AM 02/17/2022  ?  3:00 AM  ?CBC  ?WBC 4.0 - 10.5 K/uL 8.8   8.7   10.6    ?Hemoglobin 13.0 - 17.0 g/dL 15.1   15.8   16.1    ?Hematocrit 39.0 - 52.0 % 43.5   45.3   45.2    ?Platelets 150 - 400 K/uL 222   186   196    ?  ?12. Elevated LDL: continue Lipitor. ?13.  AKI: resolved although BUN mildly elevated enc po fluid  ?  ? ?  Latest Ref Rng & Units 02/22/2022  ?  6:09 AM 02/21/2022  ?  6:36 AM 02/19/2022  ?  5:27 AM  ?BMP  ?Glucose 70 - 99 mg/dL 142   152   126    ?BUN 6 - 20 mg/dL 25   23   24     ?Creatinine 0.61 -  1.24 mg/dL 1.06   1.01   1.11    ?Sodium 135 - 145 mmol/L  137   137   135    ?Potassium 3.5 - 5.1 mmol/L 4.1   4.1   4.1    ?Chloride 98 - 111 mmol/L 106   105   103    ?CO2 22 - 32 mmol/L 24   24   23     ?Calcium 8.9 - 10.3 mg/dL 8.5   8.7   8.8    ? ? ?14. Hypocalcemia:  Ionized Ca- 1.11 with normal albumin-4.1.  ?--Add calcium supplement bid.  ?15. Elevated Homocysteine levels:  B12, folate WNL--per Dr. Erlinda Hong , neuro, who felt no treatment needed as less than 50.  ?16. Obesity: BMI 35.53- provide dietary education. ? 17. Rash- resolved, ? Contact dermatitis ? At this point just at RIght antecubital fossa 4/25 ? ? ? ?LOS: ?7 days ?A FACE TO FACE EVALUATION WAS PERFORMED ? ?Gordon Fisher ?02/25/2022, 8:17 AM  ? ? ? ?

## 2022-02-26 LAB — GLUCOSE, CAPILLARY
Glucose-Capillary: 123 mg/dL — ABNORMAL HIGH (ref 70–99)
Glucose-Capillary: 121 mg/dL — ABNORMAL HIGH (ref 70–99)
Glucose-Capillary: 151 mg/dL — ABNORMAL HIGH (ref 70–99)
Glucose-Capillary: 124 mg/dL — ABNORMAL HIGH (ref 70–99)

## 2022-02-26 NOTE — Progress Notes (Signed)
Speech Language Pathology Weekly Progress and Session Note ? ?Patient Details  ?Name: [name] ?MRN: [id number] ?Date of Birth: 1960/11/07 ? ?Beginning of progress report period: February 19, 2022 ?End of progress report period: February 26, 2022 ? ?Today's Date: 02/26/2022 ?SLP Individual Time: 0800-0900 ?SLP Individual Time Calculation (min): 60 min ? ?Short Term Goals: ?Week 1: SLP Short Term Goal 1 (Week 1): Patient will consume current diet with minimal s/s of aspiration with with min A cues to implement safe swallowing strategies/precautions (slow rate, small bites/sips). Met ?SLP Short Term Goal 2 (Week 1): Patient will respond to yes/no questions via multimodal means with max A multimodal cues to achieve 60% accuracy. Met ?SLP Short Term Goal 3 (Week 1): Patient will ID field of 3 words with mod A multimodal cues to achieve 60% accuracy. Met ?SLP Short Term Goal 4 (Week 1): Pt will ID field of 3 objects with mod A multimodal cues to achieve 50% accuracy. Met ?SLP Short Term Goal 5 (Week 1): Patient complete object-to-word matching with max A multimodal cues to achieve 50% accuracy. Not yet addressed. ?SLP Short Term Goal 6 (Week 1): Patient will verbalize "yeah" and "no" accuratly in response to yes/no questions with max A multimodal cues during 2/10 attempts. Revised due to lack of progress ? ?New Short Term Goals: ?Week 2: SLP Short Term Goal 1 (Week 2): Patient will consume current diet with minimal s/s of aspiration with with min A cues to implement safe swallowing strategies/precautions (slow rate, small bites/sips) ?SLP Short Term Goal 2 (Week 2): Patient will respond to yes/no questions via multimodal means with mod A multimodal cues to achieve 80% accuracy ?SLP Short Term Goal 3 (Week 2): Patient will ID field of 3 written words by attribute/function/features with mod A multimodal cues to achieve 75% accuracy ?SLP Short Term Goal 4 (Week 2): Pt will ID field of 3 objects attribute/function/features  with mod A multimodal cues to achieve 75% accuracy ?SLP Short Term Goal 5 (Week 2): Patient complete object-to-word matching with mod A multimodal cues to achieve 75% accuracy ?SLP Short Term Goal 6 (Week 2): Pt will approximate/imitate at the sound level with max A to achieve 50% accuracy with max A multimodal cues. ? ?Weekly Progress Updates: Patient has made excellent gains and has met of 4 of 6 STGs this reporting period due to improved expressive/receptive language skills and tolerance of current diet with implementation of safe swallowing precautions and strategies. STG goal 5 was not yet addressed this reporting period, and STG 6 was revised due to lack of progress with verbalizing "no." Patient is currently communicating functional needs through multimodal means with max A via head nods, gestures, pointing to limited field of written choices, verbal response "yep", and attempts at written communication at the word level; comprehending basic content with min-to-mod A. Patient continues to consume a dysphagia 3 diet and thin liquids with sup-to-min A to implement safe swallowing strategies. Patient and family education is ongoing. Patient would benefit from continued skilled SLP intervention to maximize cognitive functioning and overall functional independence prior to discharge.   ? ?Intensity: Minumum of 1-2 x/day, 30 to 90 minutes ?Frequency: 3 to 5 out of 7 days ?Duration/Length of Stay: 5/19 ?Treatment/Interventions: Cognitive remediation/compensation;Environmental controls;Multimodal communication approach;Speech/Language Firefighter;Dysphagia/aspiration precaution training;Functional tasks;Patient/family education;Therapeutic Activities ? ?Daily Session ?Skilled Therapeutic Interventions: Skilled ST treatment focused on communication goals with emphasis on multimodal communication through gestures, head nodding, and written communication. Pt appeared slightly more inconsistent and  unclear with  head nods + yes/no responses today requiring increased clarification from SLP and family; cues to provide thumbs up/down. SLP facilitated written communication on dry erase board with max A multimodal cues for word level using aspects of Copy and Recall Therapy (CART). Pt initially cued to "write your name." Pt wrote "leg is it gel." This lead to discovery that patient was exhibiting discomfort in R leg. It required SLP skilled questioning and cueing hierarchy to determine location and type of discomfort, as there were initially many inconsistencies with responses (R leg vs. L leg, type of sensation). It was eventually determined discomfort attributed to two bruises located on pt's R calf and thigh. Pt indicated he was not interested in pain medication, however interested in a cream to help relief discomfort. Notified RN of bruising and discomfort. On dry erase board and using non-dominant hand, pt successfully copied the following words given max written/visual cues: Caryl Asp, Lenward Chancellor (granddaughter), [name] (wife). Pt successfully re-wrote each of these words without visual support x2-3. Pt was able to initiake "Bo", his grandson's name without any visual support. Pt also benefited from first written letter cue to initiate. SLP educated spouse on strategies to promote written communication and cueing to provide. Spouse verbalized understanding through teach back. Patient was left in bed with alarm activated and immediate needs within reach at end of session. Continue per current plan of care.      ? ?General  ?  ?Pain ?  ? ?Therapy/Group: Individual Therapy ? ?Kenzo Ozment T Ludene Stokke ?02/26/2022, 12:34 PM ? ? ? ? ? ? ?

## 2022-02-26 NOTE — Progress Notes (Signed)
Occupational Therapy Session Note ? ?Patient Details  ?Name: Gordon Fisher ?MRN: [id number] ?Date of Birth: 1960-11-11 ? ?Today's Date: 02/26/2022 ?OT Individual Time: 1059-1200 ?OT Individual Time Calculation (min): 61 min  ? ? ?Short Term Goals: ?Week 2:  OT Short Term Goal 1 (Week 2): pt will complete 1/3 toileting tasks with MOD A ?OT Short Term Goal 2 (Week 2): pt will sit>stand from EOB with MIN A +2 during LB dressing ?OT Short Term Goal 3 (Week 2): pt will initiate hemi technique for UB dressing with < 3 verbal cues ?OT Short Term Goal 4 (Week 2): pt will initiate grooming tasks at sink with MIN A ? ?Skilled Therapeutic Interventions/Progress Updates:  ?Pt greeted supine in bed   agreeable to OT intervention. Session focus on BADL reeducation, functional mobility, dynamic standing balance and decreasing overall caregiver burden.                ?Stated session with discussion with wife on DME needs, wife reports she doesn't feel like toilet will be accessible to him at home, feel pt would benefit most from bari [facility] to assist with lateral leans for toileting needs. Wife to take pictures of shower and fill out home measurement sheet to determine appropriate shower DME. ? ?Pt completed supine>sit with MOD A to advance RLE to EOB. Pt completed UB dressing with MOD A needing assist to thread RUE. Pt donned pants/brief with MAX A today via sit>stand. Did get pt in stedy for thorough cleaning of backside as +2 not available pt stood in stedy with CGA, total A for posterior pericare in standing.  ? ?Pt completed squat pivot to [facility] to practice toilet transfer to L side with MODA. Squat pivot back to EOB with MAX A to R side as pt stood vs squat pivot. Pt completed additional squat pivot to L side to w/c with MOD A.  ? ?Pt completed grooming tasks at sink with MOD A for oral care as pt continues to be apraxic with oral care needing step by step cues to sequence steps.   pt left reclined in TIS with alarm belt activated  and all needs within reach.     ?Therapy Documentation ?Precautions:  ?Precautions ?Precautions: Fall, Other (comment) ?Precaution Comments: R hemi, global aphasia, cortrak ?Restrictions ?Weight Bearing Restrictions: No ? ?Pain: pt grimacing to pain in RLE, alerted RN and provided rest breaks as needed  ? ? ? ?Therapy/Group: Individual Therapy ? ?Barron Schmid ?02/26/2022, 12:29 PM ?

## 2022-02-26 NOTE — Progress Notes (Signed)
Occupational Therapy Weekly Progress Note ? ?Patient Details  ?Name: Gordon Fisher ?MRN: [id number] ?Date of Birth: 06/11/1961 ? ?Beginning of progress report period: February 19, 2022 ?End of progress report period: February 26, 2022 ? ?Today's Date: 02/26/2022 ? ?Patient has met 3 of 4 short term goals. Pt currently requires MAX A for bathing from shower seat, MODA for UB dressing, MOD A +2 for LB dressing via sit>stand, MOD A for squat pivot ADL transfers to L side. Pt continues to present with mild apraxia during ADL tasks, RUE/RLE hemiplegia, impaired R hemibody sensation, global aphasia, R inattention, and impaired balance. Pts wife has been present during ADL sessions with this OTA. D/c set for 5/19, continue with POC.  ? ?Patient continues to demonstrate the following deficits: muscle weakness, decreased cardiorespiratoy endurance, impaired timing and sequencing, unbalanced muscle activation, motor apraxia, decreased coordination, and decreased motor planning, decreased visual perceptual skills, decreased attention to right, decreased attention, decreased awareness, decreased problem solving, decreased safety awareness, decreased memory, and delayed processing, and decreased sitting balance, decreased standing balance, decreased postural control, hemiplegia, and decreased balance strategies and therefore will continue to benefit from skilled OT intervention to enhance overall performance with BADL and Reduce care partner burden. ? ?Patient progressing toward long term goals..  Continue plan of care. ? ?OT Short Term Goals ?Week 1:  OT Short Term Goal 1 (Week 1): Pt will maintain static sitting balance EOB with min A >5 min. ?OT Short Term Goal 1 - Progress (Week 1): Met ?OT Short Term Goal 2 (Week 1): Pt will complete toilet transfer with max A of 2 and no lift. ?OT Short Term Goal 2 - Progress (Week 1): Met ?OT Short Term Goal 3 (Week 1): Pt will don shirt in support sitting with min A. ?OT Short Term Goal 3 -  Progress (Week 1): Progressing toward goal ?OT Short Term Goal 4 (Week 1): Pt will position RUE on pillow with max A. ?OT Short Term Goal 4 - Progress (Week 1): Met ?Week 2:  OT Short Term Goal 1 (Week 2): pt will complete 1/3 toileting tasks with MOD A ?OT Short Term Goal 2 (Week 2): pt will sit>stand from EOB with MIN A +2 during LB dressing ?OT Short Term Goal 3 (Week 2): pt will initiate hemi technique for UB dressing with < 3 verbal cues ?OT Short Term Goal 4 (Week 2): pt will initiate grooming tasks at sink with MIN A ?Therapy Documentation ?Precautions:  ?Precautions ?Precautions: Fall, Other (comment) ?Precaution Comments: R hemi, global aphasia, cortrak ?Restrictions ?Weight Bearing Restrictions: No ? ? ?Therapy/Group: Individual Therapy ? ?Mary Kate Kanoy ?02/26/2022, 7:53 AM  ?

## 2022-02-26 NOTE — Progress Notes (Signed)
Orthopedic Tech Progress Note ?Patient Details:  ?Gordon Fisher ?1960/12/14 ?[id number] ? ?Patient ID: JARMAN LITTON, male   DOB: 01-21-1961, 61 y.o.   MRN: [id number] ?Placed order with [facility] for rehab combo brace. ? ?[place] ?02/26/2022, 2:21 PM ? ?

## 2022-02-26 NOTE — Progress Notes (Signed)
?                                                       PROGRESS NOTE ? ? ?Subjective/Complaints: ? ?No issues overnite  ? ?ROS-  ?Limited by aphasia ? ?Objective: ?  ?No results found. ?No results for input(s): WBC, HGB, HCT, PLT in the last 72 hours. ? ?No results for input(s): NA, K, CL, CO2, GLUCOSE, BUN, CREATININE, CALCIUM in the last 72 hours. ? ? ?Intake/Output Summary (Last 24 hours) at 02/26/2022 0733 ?Last data filed at 02/25/2022 1844 ?Gross per 24 hour  ?Intake 414 ml  ?Output --  ?Net 414 ml  ? ?  ? ?  ? ?Physical Exam: ?Vital Signs ?Blood pressure 114/84, pulse 74, temperature 98.3 ?F (36.8 ?C), temperature source Oral, resp. rate 17, height 5\' 8"  (1.727 m), weight 106 kg, SpO2 96 %. ? ? ?General: No acute distress ?Mood and affect are appropriate ?Heart: Regular rate and rhythm no rubs murmurs or extra sounds ?Lungs: Clear to auscultation, breathing unlabored, no rales or wheezes ?Abdomen: Positive bowel sounds, soft nontender to palpation, nondistended ?Extremities: No clubbing, cyanosis, or edema ?Skin: No evidence of breakdown, maculopapular rash antecubital fossa  ? ?Neurologic: Cranial nerves II through XII intact, motor strength is 5/5 in left and 0/5 RIght deltoid, bicep, tricep, grip, hip flexor, knee extensors, ankle dorsiflexor and plantar flexor ?Trace Right hip add ?Global aphaisia unable to follow commands including eye closing, will move L arm up with gestural cues but perseverates on this motion  ?Musculoskeletal: Full range of motion in all 4 extremities. No joint swelling ? ? ? ?Assessment/Plan: ?1. Functional deficits which require 3+ hours per day of interdisciplinary therapy in a comprehensive inpatient rehab setting. ?Physiatrist is providing close team supervision and 24 hour management of active medical problems listed below. ?Physiatrist and rehab team continue to assess barriers to discharge/monitor patient progress toward functional and medical goals ? ?Care  Tool: ? ?Bathing ?   ?Body parts bathed by patient: Right arm, Abdomen, Chest, Front perineal area, Right upper leg, Left upper leg  ? Body parts bathed by helper: Left arm ?  ?  ?Bathing assist Assist Level: Maximal Assistance - Patient 24 - 49% ?  ?  ?Upper Body Dressing/Undressing ?Upper body dressing   ?What is the patient wearing?: Pull over shirt ?   ?Upper body assist Assist Level: Moderate Assistance - Patient 50 - 74% ?   ?Lower Body Dressing/Undressing ?Lower body dressing ? ? ?   ?What is the patient wearing?: Pants, Underwear/pull up ? ?  ? ?Lower body assist Assist for lower body dressing: 2 Helpers (MOD A +2) ?   ? ?Toileting ?Toileting Toileting Activity did not occur (Probation officer and hygiene only): N/A (no void or bm)  ?Toileting assist Assist for toileting: Dependent - Patient 0% ?  ?  ?Transfers ?Chair/bed transfer ? ?Transfers assist ?   ? ?Chair/bed transfer assist level: Moderate Assistance - Patient 50 - 74% (squat pivot) ?  ?  ?Locomotion ?Ambulation ? ? ?Ambulation assist ? ? Ambulation activity did not occur: Safety/medical concerns ? ?Assist level: 2 helpers ?Assistive device: Lite Gait ?Max distance: 163ft  ? ?Walk 10 feet activity ? ? ?Assist ? Walk 10 feet activity did not occur: Safety/medical concerns ? ?  ?   ? ?  Walk 50 feet activity ? ? ?Assist Walk 50 feet with 2 turns activity did not occur: Safety/medical concerns ? ?  ?   ? ? ?Walk 150 feet activity ? ? ?Assist Walk 150 feet activity did not occur: Safety/medical concerns ? ?  ?  ?  ? ?Walk 10 feet on uneven surface  ?activity ? ? ?Assist Walk 10 feet on uneven surfaces activity did not occur: Safety/medical concerns ? ? ?  ?   ? ?Wheelchair ? ? ? ? ?Assist Is the patient using a wheelchair?: Yes (Per PT long-term goals) ?Type of Wheelchair: Manual ?Wheelchair activity did not occur: Safety/medical concerns ? ?  ?   ? ? ?Wheelchair 50 feet with 2 turns activity ? ? ? ?Assist ? ?  ?Wheelchair 50 feet with 2 turns activity  did not occur: Safety/medical concerns ? ? ?   ? ?Wheelchair 150 feet activity  ? ? ? ?Assist ? Wheelchair 150 feet activity did not occur: Safety/medical concerns ? ? ?   ? ?Blood pressure 114/84, pulse 74, temperature 98.3 ?F (36.8 ?C), temperature source Oral, resp. rate 17, height 5\' 8"  (1.727 m), weight 106 kg, SpO2 96 %. ? ?Medical Problem List and Plan: ?1. 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            -patient may not shower ?            -ELOS/Goals: 5/19 min/mod A ?            Continue CIR- PT, OT and SLP - ?2.  Antithrombotics: ?-DVT/anticoagulation:  Pharmaceutical: Lovenox added as 5 days out from [facility] ?            -antiplatelet therapy:  on ASA/Brillinta ?3. Pain Management: Tylenol prn.  ?4. Mood: LCSW to follow for evaluation and support when appropriate.  ?            -antipsychotic agents: N/A ?5. Neuropsych: This patient is not capable of making decisions on his own behalf. ?6. Skin/Wound Care: Routine pressure relief measures.  ?7. Fluids/Electrolytes/Nutrition: Will monitor I/O. CMET ok except low alb  ?8. Hyponatremia: Stable. Monitor with serial checks.  ?9. Prediabetes: Hgb A1C-6.0. fair control  ?-CBG (last 3)  ?Recent Labs  ?  02/25/22 ?1557 02/25/22 ?2128 02/26/22 ?[id number]  ?GLUCAP 159* 97 123*  ? ?Controlled 4/28 ? ?10. Dysphagia: D3 thin liq  ?          eating 75-100% meals  ?11. Leucocytosis: resolved  ?         ? ?  Latest Ref Rng & Units 02/22/2022  ?  6:09 AM 02/19/2022  ?  5:27 AM 02/17/2022  ?  3:00 AM  ?CBC  ?WBC 4.0 - 10.5 K/uL 8.8   8.7   10.6    ?Hemoglobin 13.0 - 17.0 g/dL 15.1   15.8   16.1    ?Hematocrit 39.0 - 52.0 % 43.5   45.3   45.2    ?Platelets 150 - 400 K/uL 222   186   196    ?  ?12. Elevated LDL: continue Lipitor. ?13.  AKI: resolved although BUN mildly elevated enc po fluid  ?  ? ?  Latest Ref Rng & Units 02/22/2022  ?  6:09 AM 02/21/2022  ?  6:36 AM 02/19/2022  ?  5:27 AM  ?BMP  ?Glucose 70 - 99 mg/dL 142   152   126    ?BUN 6 - 20 mg/dL 25  23   24     ?Creatinine 0.61 - 1.24 mg/dL 1.06   1.01   1.11    ?Sodium 135 - 145 mmol/L 137   137   135    ?Potassium 3.5 - 5.1 mmol/L 4.1   4.1   4.1    ?Chloride 98 - 111 mmol/L 106   105   103    ?CO2 22 - 32 mmol/L 24   24   23     ?Calcium 8.9 - 10.3 mg/dL 8.5   8.7   8.8    ? ? ?14. Hypocalcemia:  Ionized Ca- 1.11 with normal albumin-4.1.  ?--Add calcium supplement bid.  ?15. Elevated Homocysteine levels:  B12, folate WNL--per Dr. Erlinda Hong , neuro, who felt no treatment needed as less than 50.  ?16. Obesity: BMI 35.53- provide dietary education. ? 17. Rash- resolved, ? Contact dermatitis ?  ? ? ? ?LOS: ?8 days ?A FACE TO FACE EVALUATION WAS PERFORMED ? ?Luanna Salk Elishah Ashmore ?02/26/2022, 7:33 AM  ? ? ? ?

## 2022-02-27 LAB — GLUCOSE, CAPILLARY
Glucose-Capillary: 126 mg/dL — ABNORMAL HIGH (ref 70–99)
Glucose-Capillary: 132 mg/dL — ABNORMAL HIGH (ref 70–99)
Glucose-Capillary: 125 mg/dL — ABNORMAL HIGH (ref 70–99)
Glucose-Capillary: 129 mg/dL — ABNORMAL HIGH (ref 70–99)

## 2022-02-27 NOTE — Progress Notes (Signed)
?                                                       PROGRESS NOTE ? ? ?Subjective/Complaints: ? ?Remains aphasic, alert smiling  ? ?ROS-  ?Limited by aphasia ? ?Objective: ?  ?No results found. ?No results for input(s): WBC, HGB, HCT, PLT in the last 72 hours. ? ?No results for input(s): NA, K, CL, CO2, GLUCOSE, BUN, CREATININE, CALCIUM in the last 72 hours. ? ? ?Intake/Output Summary (Last 24 hours) at 02/27/2022 0829 ?Last data filed at 02/27/2022 [id number] ?Gross per 24 hour  ?Intake 460 ml  ?Output --  ?Net 460 ml  ? ?  ? ?  ? ?Physical Exam: ?Vital Signs ?Blood pressure 108/72, pulse 76, temperature 98.1 ?F (36.7 ?C), temperature source Oral, resp. rate 18, height 5\' 8"  (1.727 m), weight 106 kg, SpO2 93 %. ? ? ?General: No acute distress ?Mood and affect are appropriate ?Heart: Regular rate and rhythm no rubs murmurs or extra sounds ?Lungs: Clear to auscultation, breathing unlabored, no rales or wheezes ?Abdomen: Positive bowel sounds, soft nontender to palpation, nondistended ?Extremities: No clubbing, cyanosis, or edema ?Skin: No evidence of breakdown, maculopapular rash antecubital fossa  ? ?Neurologic: Cranial nerves II through XII intact, motor strength is 5/5 in left and 0/5 RIght deltoid, bicep, tricep, grip, hip flexor, knee extensors, ankle dorsiflexor and plantar flexor ?Trace Right hip add ?Global aphaisia unable to follow commands including eye closing, will move L arm up with gestural cues but perseverates on this motion  ?Musculoskeletal: Full range of motion in all 4 extremities. No joint swelling ? ? ? ?Assessment/Plan: ?1. Functional deficits which require 3+ hours per day of interdisciplinary therapy in a comprehensive inpatient rehab setting. ?Physiatrist is providing close team supervision and 24 hour management of active medical problems listed below. ?Physiatrist and rehab team continue to assess barriers to discharge/monitor patient progress toward functional and medical goals ? ?Care  Tool: ? ?Bathing ?   ?Body parts bathed by patient: Right arm, Abdomen, Chest, Front perineal area, Right upper leg, Left upper leg  ? Body parts bathed by helper: Left arm ?  ?  ?Bathing assist Assist Level: Maximal Assistance - Patient 24 - 49% ?  ?  ?Upper Body Dressing/Undressing ?Upper body dressing   ?What is the patient wearing?: Pull over shirt ?   ?Upper body assist Assist Level: Moderate Assistance - Patient 50 - 74% ?   ?Lower Body Dressing/Undressing ?Lower body dressing ? ? ?   ?What is the patient wearing?: Pants, Underwear/pull up ? ?  ? ?Lower body assist Assist for lower body dressing: 2 Helpers (MOD A +2) ?   ? ?Toileting ?Toileting Toileting Activity did not occur (Probation officer and hygiene only): N/A (no void or bm)  ?Toileting assist Assist for toileting: Dependent - Patient 0% ?  ?  ?Transfers ?Chair/bed transfer ? ?Transfers assist ?   ? ?Chair/bed transfer assist level: Moderate Assistance - Patient 50 - 74% (squat pivot) ?  ?  ?Locomotion ?Ambulation ? ? ?Ambulation assist ? ? Ambulation activity did not occur: Safety/medical concerns ? ?Assist level: 2 helpers ?Assistive device: Lite Gait ?Max distance: 11ft  ? ?Walk 10 feet activity ? ? ?Assist ? Walk 10 feet activity did not occur: Safety/medical concerns ? ?  ?   ? ?  Walk 50 feet activity ? ? ?Assist Walk 50 feet with 2 turns activity did not occur: Safety/medical concerns ? ?  ?   ? ? ?Walk 150 feet activity ? ? ?Assist Walk 150 feet activity did not occur: Safety/medical concerns ? ?  ?  ?  ? ?Walk 10 feet on uneven surface  ?activity ? ? ?Assist Walk 10 feet on uneven surfaces activity did not occur: Safety/medical concerns ? ? ?  ?   ? ?Wheelchair ? ? ? ? ?Assist Is the patient using a wheelchair?: Yes (Per PT long-term goals) ?Type of Wheelchair: Manual ?Wheelchair activity did not occur: Safety/medical concerns ? ?  ?   ? ? ?Wheelchair 50 feet with 2 turns activity ? ? ? ?Assist ? ?  ?Wheelchair 50 feet with 2 turns activity  did not occur: Safety/medical concerns ? ? ?   ? ?Wheelchair 150 feet activity  ? ? ? ?Assist ? Wheelchair 150 feet activity did not occur: Safety/medical concerns ? ? ?   ? ?Blood pressure 108/72, pulse 76, temperature 98.1 ?F (36.7 ?C), temperature source Oral, resp. rate 18, height 5\' 8"  (1.727 m), weight 106 kg, SpO2 93 %. ? ?Medical Problem List and Plan: ?1. 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            -patient may not shower ?            -ELOS/Goals: 5/19 min/mod A ?            Continue CIR- PT, OT and SLP - ?2.  Antithrombotics: ?-DVT/anticoagulation:  Pharmaceutical: Lovenox added as 5 days out from [facility] ?            -antiplatelet therapy:  on ASA/Brillinta ?3. Pain Management: Tylenol prn.  ?4. Mood: LCSW to follow for evaluation and support when appropriate.  ?            -antipsychotic agents: N/A ?5. Neuropsych: This patient is not capable of making decisions on his own behalf. ?6. Skin/Wound Care: Routine pressure relief measures.  ?7. Fluids/Electrolytes/Nutrition: Will monitor I/O. CMET ok except low alb  ?8. Hyponatremia: Stable. Monitor with serial checks.  ?9. Prediabetes: Hgb A1C-6.0. fair control  ?-CBG (last 3)  ?Recent Labs  ?  02/26/22 ?1634 02/26/22 ?2117 02/27/22 ?4469  ?GLUCAP 151* 124* 126*  ? ?Controlled 4/29 ? ?10. Dysphagia: D3 thin liq  ?          eating 75-100% meals  ?11. Leucocytosis: resolved  ?         ? ?  Latest Ref Rng & Units 02/22/2022  ?  6:09 AM 02/19/2022  ?  5:27 AM 02/17/2022  ?  3:00 AM  ?CBC  ?WBC 4.0 - 10.5 K/uL 8.8   8.7   10.6    ?Hemoglobin 13.0 - 17.0 g/dL 50.7   22.5   75.0    ?Hematocrit 39.0 - 52.0 % 43.5   45.3   45.2    ?Platelets 150 - 400 K/uL 222   186   196    ?  ?12. Elevated LDL: continue Lipitor. ?13.  AKI: resolved although BUN mildly elevated enc po fluid  ?  ? ?  Latest Ref Rng & Units 02/22/2022  ?  6:09 AM 02/21/2022  ?  6:36 AM 02/19/2022  ?  5:27 AM  ?BMP  ?Glucose 70 - 99 mg/dL 518   335   825    ?BUN 6 - 20 mg/dL 25  23   24     ?Creatinine 0.61 - 1.24 mg/dL 1.06   1.01   1.11    ?Sodium 135 - 145 mmol/L 137   137   135    ?Potassium 3.5 - 5.1 mmol/L 4.1   4.1   4.1    ?Chloride 98 - 111 mmol/L 106   105   103    ?CO2 22 - 32 mmol/L 24   24   23     ?Calcium 8.9 - 10.3 mg/dL 8.5   8.7   8.8    ? ? ?14. Hypocalcemia:  Ionized Ca- 1.11 with normal albumin-4.1.  ?--Add calcium supplement bid.  ?15. Elevated Homocysteine levels:  B12, folate WNL--per Dr. Erlinda Hong , neuro, who felt no treatment needed as less than 50.  ?16. Obesity: BMI 35.53- provide dietary education. ? 17. Rash- resolved, ? Contact dermatitis ?  ? ? ? ?LOS: ?9 days ?A FACE TO FACE EVALUATION WAS PERFORMED ? ?Gordon Fisher ?02/27/2022, 8:29 AM  ? ? ? ?

## 2022-02-27 NOTE — Progress Notes (Signed)
Physical Therapy Weekly Progress Note ? ?Patient Details  ?Name: [name] ?MRN: [id number] ?Date of Birth: [birth year] ? ?Beginning of progress report period: February 19, 2022 ?End of progress report period: February 27, 2022 ? ?Today's Date: 02/27/2022 ?PT Individual Time: 6144-3154 ?PT Individual Time Calculation (min): 40 min  ? ?Patient has met 3 of 3 short term goals.  Mr. 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 Due to the aphasia and apraxia pt with significantly difficulty following complex commands during functional mobility training. Pt performs supine>sit with mod assist and sit>supine with min assist all using bed features, sit<>stands with +2 mod assist due to R lean/pushing, and squat pivot transfers with mod/max assist (more impaired motor planning transferring towards R). He is participating in therapeutic gait training with 3 Musketeer/HHA support and +2 max assist for balance and R LE gait mechanics with details below. Pt will benefit from continued CIR level therapies prior to D/Cing home with 24hr assistance from family.  ? ?Patient continues to demonstrate the following deficits muscle weakness, muscle joint tightness, and muscle paralysis, decreased cardiorespiratoy endurance, impaired timing and sequencing, abnormal tone, unbalanced muscle activation, motor apraxia, decreased coordination, and decreased motor planning, decreased visual perceptual skills and decreased visual motor skills, decreased midline orientation and decreased attention to right, decreased attention, decreased awareness, decreased problem solving, decreased safety awareness, decreased memory, and delayed processing, and decreased sitting balance, decreased standing balance, decreased postural control, hemiplegia,  and decreased balance strategies and therefore will continue to benefit from skilled PT intervention to increase functional independence with mobility. ? ?Patient progressing toward long term goals..  Continue plan of care. ? ?PT Short Term Goals ?Week 1:  PT Short Term Goal 1 (Week 1): Pt will transfer sup <> sit w/ mod A ?PT Short Term Goal 1 - Progress (Week 1): Met ?PT Short Term Goal 2 (Week 1): Pt will transfer sit to stand w/ Max A from lower surface. ?PT Short Term Goal 2 - Progress (Week 1): Met ?PT Short Term Goal 3 (Week 1): Pt will perform SPT w/ max A ?PT Short Term Goal 3 - Progress (Week 1): Met ?Week 2:  PT Short Term Goal 1 (Week 2): Pt will perform supine<>sit with min assist ?PT Short Term Goal 2 (Week 2): Pt will perform sit<>stands using LRAD with mod assist  of 1 ?PT Short Term Goal 3 (Week 2): Pt will perform bed<>chair transfers using LRAD wtih mod assist of 1 ?PT Short Term Goal 4 (Week 2): Pt will ambulate at least 32f using LRAD with max assist of 1 and +2 min assist ?PT Short Term Goal 5 (Week 2): Pt will participate in [place]? ?Skilled Therapeutic Interventions/Progress Updates:  ?Ambulation/gait training;Discharge planning;DME/adaptive equipment instruction;Functional mobility training;Therapeutic Activities;UE/LE Strength taining/ROM;Visual/perceptual remediation/compensation;Balance/vestibular training;Community reintegration;Neuromuscular re-education;Patient/family education;Stair training;Therapeutic Exercise;UE/LE Coordination activities;Wheelchair propulsion/positioning;Cognitive remediation/compensation;Disease management/prevention;Splinting/orthotics;Psychosocial support;Pain management;Skin care/wound management;Functional electrical stimulation  ? ?Pt received supine in bed and nods head yes in agreement to therapy session. Pt remains globally aphasic (expressive more impaired than receptive). Supine>sitting R EOB, HOB partially elevated and relying  heavily on L UE support to bring trunk upright with light mod assist for trunk upright. Sitting EOB with min/mod assist for trunk control due to R lean/LOB while donning shirt max assist and pants/shoes with total assist - donned R LE [place] Walk-on Reaction  GRAFO . ? ?L squat pivot transfer EOB>TIS w/c with min/light mod assist for pivoting hips and total assist for R LE management during the transfer.  Transported to/from gym in w/c for time management and energy conservation. ? ?Gait training ~58f +[id number] +2 3 Musketeer/HHA with +2 mod/max assist for balance and R LE management  ?- continues to have significant R lean due to pushing, that worsens with increased distance ?- requires total assist to advance R LE during swing ?- guarding R knee during stance with AFO helping maintain knee extension but requiring manual facilitation for improved knee position ?- on 3rd & 4th walk 3rd person provided visual targets for pt to step to in order to promote improved motor planning to advance R LE and increase L LE step length as pt continues to perform step-to pattern with L LE often "hopping" over R LE quickly causing short L step length. - pt responded really well to these visual targets! ? ?Left seated in TIS w/c in the care of other PT.  ? ?Therapy Documentation ?Precautions:  ?Precautions ?Precautions: Fall, Other (comment) ?Precaution Comments: R hemi, global aphasia, cortrak ?Restrictions ?Weight Bearing Restrictions: No ? ?Pain: ?Pt frequently grunts, moans, groans during session but no overt signs of pain/discomfort and therapist still moves R LE slowly into knee flexion to place foot underneath BOS prior to initiating coming to stand to avoid pain in quads. ? ?Therapy/Group: Individual Therapy ? ?CarlTawana ScaleT, DPT, NCS, CSRS ?02/27/2022, 7:58 AM  ?

## 2022-02-27 NOTE — Plan of Care (Signed)
?  Problem: Consults ?Goal: RH STROKE PATIENT EDUCATION ?Description: See Patient Education module for education specifics  ?02/27/2022 1029 by Berneta Sages, RN ?Outcome: Progressing ?02/27/2022 1024 by Berneta Sages, RN ?Outcome: Progressing ?  ?Problem: RH BOWEL ELIMINATION ?Goal: RH STG MANAGE BOWEL WITH ASSISTANCE ?Description: STG Manage Bowel with mod I Assistance. ?02/27/2022 1029 by Berneta Sages, RN ?Outcome: Progressing ?02/27/2022 1024 by Berneta Sages, RN ?Outcome: Progressing ?Goal: RH STG MANAGE BOWEL W/MEDICATION W/ASSISTANCE ?Description: STG Manage Bowel with Medication with mod I Assistance. ?02/27/2022 1029 by Berneta Sages, RN ?Outcome: Progressing ?02/27/2022 1024 by Berneta Sages, RN ?Outcome: Progressing ?  ?Problem: RH SAFETY ?Goal: RH STG ADHERE TO SAFETY PRECAUTIONS W/ASSISTANCE/DEVICE ?Description: STG Adhere to Safety Precautions With cues Assistance/Device. ?02/27/2022 1029 by Berneta Sages, RN ?Outcome: Progressing ?02/27/2022 1024 by Berneta Sages, RN ?Outcome: Progressing ?  ?Problem: RH PAIN MANAGEMENT ?Goal: RH STG PAIN MANAGED AT OR BELOW PT'S PAIN GOAL ?Description: At or below level 4 with prns ?02/27/2022 1029 by Berneta Sages, RN ?Outcome: Progressing ?02/27/2022 1024 by Berneta Sages, RN ?Outcome: Progressing ?  ?Problem: RH KNOWLEDGE DEFICIT ?Goal: RH STG INCREASE KNOWLEDGE OF DIABETES ?Description: Patient and spouse will be able to manage prediabetes with dietary modifications using handouts and educational materials independently ?02/27/2022 1029 by Berneta Sages, RN ?Outcome: Progressing ?02/27/2022 1024 by Berneta Sages, RN ?Outcome: Progressing ?Goal: RH STG INCREASE KNOWLEDGE OF DYSPHAGIA/FLUID INTAKE ?Description: Patient and spouse will be able to manage dysphagia, medications and dietary modifications using handouts and educational materials independently ?02/27/2022 1029 by Berneta Sages, RN ?Outcome:  Progressing ?02/27/2022 1024 by Berneta Sages, RN ?Outcome: Progressing ?Goal: RH STG INCREASE KNOWLEGDE OF HYPERLIPIDEMIA ?Description: Patient and spouse will be able to manage HLD with medications and dietary modifications using handouts and educational materials independently ?02/27/2022 1029 by Berneta Sages, RN ?Outcome: Progressing ?02/27/2022 1024 by Berneta Sages, RN ?Outcome: Progressing ?Goal: RH STG INCREASE KNOWLEDGE OF STROKE PROPHYLAXIS ?Description: Patient and spouse will be able to manage secondary stroke risks with medications and dietary modifications using handouts and educational materials independently ?02/27/2022 1029 by Berneta Sages, RN ?Outcome: Progressing ?02/27/2022 1024 by Berneta Sages, RN ?Outcome: Progressing ?  ?

## 2022-02-27 NOTE — Progress Notes (Signed)
Occupational Therapy Session Note ? ?Patient Details  ?Name: Gordon Fisher ?MRN: [id number] ?Date of Birth: May 04, 1961 ? ?Today's Date: 02/27/2022 ?OT Individual Time: 2376-2831 ?OT Individual Time Calculation (min): 32 min  ? ? ?Short Term Goals: ?Week 1:  OT Short Term Goal 1 (Week 1): Pt will maintain static sitting balance EOB with min A >5 min. ?OT Short Term Goal 1 - Progress (Week 1): Met ?OT Short Term Goal 2 (Week 1): Pt will complete toilet transfer with max A of 2 and no lift. ?OT Short Term Goal 2 - Progress (Week 1): Met ?OT Short Term Goal 3 (Week 1): Pt will don shirt in support sitting with min A. ?OT Short Term Goal 3 - Progress (Week 1): Progressing toward goal ?OT Short Term Goal 4 (Week 1): Pt will position RUE on pillow with max A. ?OT Short Term Goal 4 - Progress (Week 1): Met ?Week 2:  OT Short Term Goal 1 (Week 2): pt will complete 1/3 toileting tasks with MOD A ?OT Short Term Goal 2 (Week 2): pt will sit>stand from EOB with MIN A +2 during LB dressing ?OT Short Term Goal 3 (Week 2): pt will initiate hemi technique for UB dressing with < 3 verbal cues ?OT Short Term Goal 4 (Week 2): pt will initiate grooming tasks at sink with MIN A ? ?Skilled Therapeutic Interventions/Progress Updates:  ?  Pt received seated in TIS with RN present, no c/o pain, agreeable to therapy. Session focus on RUE NMR in prep for improved ADL/IADL/func mobility performance + decreased caregiver burden. ? ?Total A TIS transport to and from gym. Pt completed 3x10 of the following to promote RUE WB and NRM: B forearm pulses, forward and circle towel slides.  ? ?1:1 NMES applied to R digit and wrist extensors at the below settings:  ? ?Ratio 1:3 ?Rate 35 pps ?Waveform- Asymmetric ?Ramp 1.0 ?Pulse 300 ?Intensity- 33  ?Duration -    6 min ? ?No adverse reactions after treatment and is skin intact. Total A to facilitate grasp/release on sponge. Pt excited to see his hand move! ? ?Pt left tilted back in TIS with safety belt  alarm engaged, call bell in reach, and all immediate needs met.  ? ? ?Therapy Documentation ?Precautions:  ?Precautions ?Precautions: Fall, Other (comment) ?Precaution Comments: R hemi, global aphasia, cortrak ?Restrictions ?Weight Bearing Restrictions: No ? ?Pain: ? No c/o throughout ?ADL: See Care Tool for more details. ? ? ?Therapy/Group: Individual Therapy ? ?Volanda Napoleon MS, OTR/L ? ?02/27/2022, 6:51 AM ?

## 2022-02-27 NOTE — Plan of Care (Signed)
?  Problem: Consults ?Goal: RH STROKE PATIENT EDUCATION ?Description: See Patient Education module for education specifics  ?Outcome: Progressing ?  ?Problem: RH BOWEL ELIMINATION ?Goal: RH STG MANAGE BOWEL WITH ASSISTANCE ?Description: STG Manage Bowel with mod I Assistance. ?Outcome: Progressing ?Goal: RH STG MANAGE BOWEL W/MEDICATION W/ASSISTANCE ?Description: STG Manage Bowel with Medication with mod I Assistance. ?Outcome: Progressing ?  ?Problem: RH SAFETY ?Goal: RH STG ADHERE TO SAFETY PRECAUTIONS W/ASSISTANCE/DEVICE ?Description: STG Adhere to Safety Precautions With cues Assistance/Device. ?Outcome: Progressing ?  ?Problem: RH PAIN MANAGEMENT ?Goal: RH STG PAIN MANAGED AT OR BELOW PT'S PAIN GOAL ?Description: At or below level 4 with prns ?Outcome: Progressing ?  ?Problem: RH KNOWLEDGE DEFICIT ?Goal: RH STG INCREASE KNOWLEDGE OF DIABETES ?Description: Patient and spouse will be able to manage prediabetes with dietary modifications using handouts and educational materials independently ?Outcome: Progressing ?Goal: RH STG INCREASE KNOWLEDGE OF DYSPHAGIA/FLUID INTAKE ?Description: Patient and spouse will be able to manage dysphagia, medications and dietary modifications using handouts and educational materials independently ?Outcome: Progressing ?Goal: RH STG INCREASE KNOWLEGDE OF HYPERLIPIDEMIA ?Description: Patient and spouse will be able to manage HLD with medications and dietary modifications using handouts and educational materials independently ?Outcome: Progressing ?Goal: RH STG INCREASE KNOWLEDGE OF STROKE PROPHYLAXIS ?Description: Patient and spouse will be able to manage secondary stroke risks with medications and dietary modifications using handouts and educational materials independently ?Outcome: Progressing ?  ?

## 2022-02-27 NOTE — Progress Notes (Signed)
Physical Therapy Session Note ? ?Patient Details  ?Name: Gordon Fisher ?MRN: [id number] ?Date of Birth: [birth year] ? ?Today's Date: 02/27/2022 ?PT Individual Time: 2575-0518 ?PT Individual Time Calculation (min): 19 min  ? ?Short Term Goals: ?Week 1:  PT Short Term Goal 1 (Week 1): Pt will transfer sup <> sit w/ mod A ?PT Short Term Goal 1 - Progress (Week 1): Met ?PT Short Term Goal 2 (Week 1): Pt will transfer sit to stand w/ Max A from lower surface. ?PT Short Term Goal 2 - Progress (Week 1): Met ?PT Short Term Goal 3 (Week 1): Pt will perform SPT w/ max A ?PT Short Term Goal 3 - Progress (Week 1): Met ?Week 2:  PT Short Term Goal 1 (Week 2): Pt will perform supine<>sit with min assist ?PT Short Term Goal 2 (Week 2): Pt will perform sit<>stands using LRAD with mod assist  of 1 ?PT Short Term Goal 3 (Week 2): Pt will perform bed<>chair transfers using LRAD wtih mod assist of 1 ?PT Short Term Goal 4 (Week 2): Pt will ambulate at least 19f using LRAD with max assist of 1 and +2 min assist ?PT Short Term Goal 5 (Week 2): [place]? ?Skilled Therapeutic Interventions/Progress Updates:  ?Patient handed off from PT session already seated in w/c. Patient alert and agreeable to PT session.  ? ?Patient with no pain complaint throughout session. ? ?Therapeutic Activity: ?Transfers: Patient performed sit<>stand transfers throughout session to/ from w/c with CGA for power up and ModA for attaining/ maintaining standing balance with guard/ facilitation at R quad for maintained R hip/ knee extension. Provided verbal cues for upright posture. Pt c/o pain at shoulder with UE around therapist's shoulders, so pt assisted at anterior chest/ shoulder for tc to maintain upright positioning.  ? ?Neuromuscular Re-ed: ?NMR facilitated during session with focus onreach with LUE to target placed overhead and to far L in order to promote weight shift and stance to LLE . Pt guided in reach to tall rim of  plastic basketball hoop. Pt is able to demo small shift to LLE with no assist to achieve when target placed far to BOS. Requires significant reach but is able to overcome push and demo min lateral shift. NMR performed for improvements in motor control and coordination, balance, sequencing, judgement, and self confidence/ efficacy in performing all aspects of mobility at highest level of independence.  ? ?Patient seated upright  in TIS w/c in room at end of session with brakes locked, belt alarm set, and all needs within reach. ?  ? ?Therapy Documentation ?Precautions:  ?Precautions ?Precautions: Fall, Other (comment) ?Precaution Comments: R hemi, global aphasia, cortrak ?Restrictions ?Weight Bearing Restrictions: No ?General: ?  ?Vital Signs: ?Therapy Vitals ?Temp: 98.5 ?F (36.9 ?C) ?Temp Source: Oral ?Pulse Rate: 96 ?Resp: 16 ?BP: 125/85 ?Patient Position (if appropriate): Sitting ?Oxygen Therapy ?SpO2: 96 % ?O2 Device: Room Air ?Pain: ? Min pain complaint at R shoulder and addressed with repositioning with reduction in pain.  ? ?Therapy/Group: Individual Therapy ? ?JAlger SimonsPT, DPT ?02/27/2022, 2:12 PM  ?

## 2022-02-28 LAB — GLUCOSE, CAPILLARY
Glucose-Capillary: 119 mg/dL — ABNORMAL HIGH (ref 70–99)
Glucose-Capillary: 116 mg/dL — ABNORMAL HIGH (ref 70–99)
Glucose-Capillary: 136 mg/dL — ABNORMAL HIGH (ref 70–99)
Glucose-Capillary: 122 mg/dL — ABNORMAL HIGH (ref 70–99)

## 2022-02-28 NOTE — Progress Notes (Signed)
?                                                       PROGRESS NOTE ? ? ?Subjective/Complaints: ? ?Remains aphasic, improved with Y/N response, ate well  ? ?ROS-  ?Limited by aphasia ? ?Objective: ?  ?No results found. ?No results for input(s): WBC, HGB, HCT, PLT in the last 72 hours. ? ?No results for input(s): NA, K, CL, CO2, GLUCOSE, BUN, CREATININE, CALCIUM in the last 72 hours. ? ? ?Intake/Output Summary (Last 24 hours) at 02/28/2022 0831 ?Last data filed at 02/28/2022 0818 ?Gross per 24 hour  ?Intake 700 ml  ?Output --  ?Net 700 ml  ? ?  ? ?  ? ?Physical Exam: ?Vital Signs ?Blood pressure 114/76, pulse 76, temperature 98.4 ?F (36.9 ?C), temperature source Oral, resp. rate 18, height 5\' 8"  (1.727 m), weight 106 kg, SpO2 95 %. ? ? ?General: No acute distress ?Mood and affect are appropriate ?Heart: Regular rate and rhythm no rubs murmurs or extra sounds ?Lungs: Clear to auscultation, breathing unlabored, no rales or wheezes ?Abdomen: Positive bowel sounds, soft nontender to palpation, nondistended ?Extremities: No clubbing, cyanosis, or edema ?Skin: No evidence of breakdown, maculopapular rash antecubital fossa  ? ?Neurologic: Cranial nerves II through XII intact, motor strength is 5/5 in left and 0/5 RIght deltoid, bicep, tricep, grip, hip flexor, knee extensors, ankle dorsiflexor and plantar flexor ?Trace Right hip add ?Able to follow simple commands but perseverates  ?Musculoskeletal: Full range of motion in all 4 extremities. No joint swelling ? ? ? ?Assessment/Plan: ?1. Functional deficits which require 3+ hours per day of interdisciplinary therapy in a comprehensive inpatient rehab setting. ?Physiatrist is providing close team supervision and 24 hour management of active medical problems listed below. ?Physiatrist and rehab team continue to assess barriers to discharge/monitor patient progress toward functional and medical goals ? ?Care Tool: ? ?Bathing ?   ?Body parts bathed by patient: Right arm,  Abdomen, Chest, Front perineal area, Right upper leg, Left upper leg  ? Body parts bathed by helper: Left arm ?  ?  ?Bathing assist Assist Level: Maximal Assistance - Patient 24 - 49% ?  ?  ?Upper Body Dressing/Undressing ?Upper body dressing   ?What is the patient wearing?: Pull over shirt ?   ?Upper body assist Assist Level: Moderate Assistance - Patient 50 - 74% ?   ?Lower Body Dressing/Undressing ?Lower body dressing ? ? ?   ?What is the patient wearing?: Pants, Underwear/pull up ? ?  ? ?Lower body assist Assist for lower body dressing: 2 Helpers (MOD A +2) ?   ? ?Toileting ?Toileting Toileting Activity did not occur (Probation officer and hygiene only): N/A (no void or bm)  ?Toileting assist Assist for toileting: Dependent - Patient 0% ?  ?  ?Transfers ?Chair/bed transfer ? ?Transfers assist ?   ? ?Chair/bed transfer assist level: Moderate Assistance - Patient 50 - 74% (squat pivot) ?  ?  ?Locomotion ?Ambulation ? ? ?Ambulation assist ? ? Ambulation activity did not occur: Safety/medical concerns ? ?Assist level: 2 helpers ?Assistive device: Lite Gait ?Max distance: 130ft  ? ?Walk 10 feet activity ? ? ?Assist ? Walk 10 feet activity did not occur: Safety/medical concerns ? ?  ?   ? ?Walk 50 feet activity ? ? ?Assist Walk 50 feet  with 2 turns activity did not occur: Safety/medical concerns ? ?  ?   ? ? ?Walk 150 feet activity ? ? ?Assist Walk 150 feet activity did not occur: Safety/medical concerns ? ?  ?  ?  ? ?Walk 10 feet on uneven surface  ?activity ? ? ?Assist Walk 10 feet on uneven surfaces activity did not occur: Safety/medical concerns ? ? ?  ?   ? ?Wheelchair ? ? ? ? ?Assist Is the patient using a wheelchair?: Yes (Per PT long-term goals) ?Type of Wheelchair: Manual ?Wheelchair activity did not occur: Safety/medical concerns ? ?  ?   ? ? ?Wheelchair 50 feet with 2 turns activity ? ? ? ?Assist ? ?  ?Wheelchair 50 feet with 2 turns activity did not occur: Safety/medical concerns ? ? ?   ? ?Wheelchair  150 feet activity  ? ? ? ?Assist ? Wheelchair 150 feet activity did not occur: Safety/medical concerns ? ? ?   ? ?Blood pressure 114/76, pulse 76, temperature 98.4 ?F (36.9 ?C), temperature source Oral, resp. rate 18, height 5\' 8"  (1.727 m), weight 106 kg, SpO2 95 %. ? ?Medical Problem List and Plan: ?1. 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            -patient may not shower ?            -ELOS/Goals: 5/19 min/mod A ?            Continue CIR- PT, OT and SLP - ?2.  Antithrombotics: ?-DVT/anticoagulation:  Pharmaceutical: Lovenox added as 5 days out from [facility] ?            -antiplatelet therapy:  on ASA/Brillinta ?3. Pain Management: Tylenol prn.  ?4. Mood: LCSW to follow for evaluation and support when appropriate.  ?            -antipsychotic agents: N/A ?5. Neuropsych: This patient is not capable of making decisions on his own behalf. ?6. Skin/Wound Care: Routine pressure relief measures.  ?7. Fluids/Electrolytes/Nutrition: Will monitor I/O. CMET ok except low alb  ?8. Hyponatremia: Stable. Monitor with serial checks.  ?9. Prediabetes: Hgb A1C-6.0. fair control  ?-CBG (last 3)  ?Recent Labs  ?  02/27/22 ?1640 02/27/22 ?2049 02/28/22 ?0600  ?GLUCAP 125* 129* 119*  ? ?Controlled 4/30 ? ?10. Dysphagia: D3 thin liq  ?          eating 75-100% meals  ?11. Leucocytosis: resolved  ?         ? ?  Latest Ref Rng & Units 02/22/2022  ?  6:09 AM 02/19/2022  ?  5:27 AM 02/17/2022  ?  3:00 AM  ?CBC  ?WBC 4.0 - 10.5 K/uL 8.8   8.7   10.6    ?Hemoglobin 13.0 - 17.0 g/dL 15.1   15.8   16.1    ?Hematocrit 39.0 - 52.0 % 43.5   45.3   45.2    ?Platelets 150 - 400 K/uL 222   186   196    ?  ?12. Elevated LDL: continue Lipitor. ?13.  AKI: resolved although BUN mildly elevated enc po fluid  ?  ? ?  Latest Ref Rng & Units 02/22/2022  ?  6:09 AM 02/21/2022  ?  6:36 AM 02/19/2022  ?  5:27 AM  ?BMP  ?Glucose 70 - 99 mg/dL 142   152   126    ?BUN 6 - 20 mg/dL 25   23   24     ?Creatinine 0.61 -  1.24 mg/dL 1.06   1.01   1.11    ?Sodium 135 -  145 mmol/L 137   137   135    ?Potassium 3.5 - 5.1 mmol/L 4.1   4.1   4.1    ?Chloride 98 - 111 mmol/L 106   105   103    ?CO2 22 - 32 mmol/L 24   24   23     ?Calcium 8.9 - 10.3 mg/dL 8.5   8.7   8.8    ? ? ?14. Hypocalcemia:  Ionized Ca- 1.11 with normal albumin-4.1.  ?--Add calcium supplement bid.  ?15. Elevated Homocysteine levels:  B12, folate WNL--per Dr. Erlinda Hong , neuro, who felt no treatment needed as less than 50.  ?16. Obesity: BMI 35.53- provide dietary education. ? 17. Rash- resolved, ? Contact dermatitis ?  ? ? ? ?LOS: ?10 days ?A FACE TO FACE EVALUATION WAS PERFORMED ? ?Luanna Salk Ziyon Soltau ?02/28/2022, 8:31 AM  ? ? ? ?

## 2022-03-01 LAB — CBC
HCT: 43 % (ref 39.0–52.0)
Hemoglobin: 15.2 g/dL (ref 13.0–17.0)
MCH: 31.9 pg (ref 26.0–34.0)
MCHC: 35.3 g/dL (ref 30.0–36.0)
MCV: 90.1 fL (ref 80.0–100.0)
Platelets: 239 10*3/uL (ref 150–400)
RBC: 4.77 MIL/uL (ref 4.22–5.81)
RDW: 12.3 % (ref 11.5–15.5)
WBC: 6.8 10*3/uL (ref 4.0–10.5)
nRBC: 0 % (ref 0.0–0.2)

## 2022-03-01 LAB — GLUCOSE, CAPILLARY
Glucose-Capillary: 97 mg/dL (ref 70–99)
Glucose-Capillary: 126 mg/dL — ABNORMAL HIGH (ref 70–99)
Glucose-Capillary: 164 mg/dL — ABNORMAL HIGH (ref 70–99)

## 2022-03-01 LAB — BASIC METABOLIC PANEL
Anion gap: 8 (ref 5–15)
BUN: 18 mg/dL (ref 6–20)
CO2: 24 mmol/L (ref 22–32)
Calcium: 8.8 mg/dL — ABNORMAL LOW (ref 8.9–10.3)
Chloride: 105 mmol/L (ref 98–111)
Creatinine, Ser: 1.08 mg/dL (ref 0.61–1.24)
GFR, Estimated: >60 mL/min (ref >60)
Glucose, Bld: 112 mg/dL — ABNORMAL HIGH (ref 70–99)
Potassium: 3.7 mmol/L (ref 3.5–5.1)
Sodium: 137 mmol/L (ref 135–145)

## 2022-03-01 NOTE — Progress Notes (Signed)
?                                                       PROGRESS NOTE ? ? ?Subjective/Complaints: ? ?Kept saying yes this AM to questions- ?Eating hungrily;  ?NT at bedside ? ? ?ROS-  ?Limited by aphasia ?Objective: ?  ?No results found. ?Recent Labs  ?  03/01/22 ?0601  ?WBC 6.8  ?HGB 15.2  ?HCT 43.0  ?PLT 239  ? ?Recent Labs  ?  03/01/22 ?0601  ?NA 137  ?K 3.7  ?CL 105  ?CO2 24  ?GLUCOSE 112*  ?BUN 18  ?CREATININE 1.08  ?CALCIUM 8.8*  ? ? ?Intake/Output Summary (Last 24 hours) at 03/01/2022 0824 ?Last data filed at 03/01/2022 0748 ?Gross per 24 hour  ?Intake 1040 ml  ?Output --  ?Net 1040 ml  ?  ? ?  ? ?Physical Exam: ?Vital Signs ?Blood pressure 109/74, pulse 83, temperature 98.4 ?F (36.9 ?C), temperature source Oral, resp. rate 18, height 5\' 8"  (1.727 m), weight 106 kg, SpO2 96 %. ? ? ? ?General: awake, alert, appropriate, sitting up in bed; with NT in room; eating very well- NAD ?HENT: conjugate gaze; oropharynx moist ?CV: regular rate; no JVD ?Pulmonary: CTA B/L; no W/R/R- good air movement ?GI: soft, NT, ND, (+)BS ?Psychiatric: bright affect- cannot tell if understands what's being said ?Neurological: kept saying yes- no "no" said or any sign of shaking head yes or no ?Neurologic: Cranial nerves II through XII intact, motor strength is 5/5 in left and 0/5 RIght deltoid, bicep, tricep, grip, hip flexor, knee extensors, ankle dorsiflexor and plantar flexor ?Trace Right hip add ?Able to follow simple commands but perseverates  ?Musculoskeletal: Full range of motion in all 4 extremities. No joint swelling ? ? ? ?Assessment/Plan: ?1. Functional deficits which require 3+ hours per day of interdisciplinary therapy in a comprehensive inpatient rehab setting. ?Physiatrist is providing close team supervision and 24 hour management of active medical problems listed below. ?Physiatrist and rehab team continue to assess barriers to discharge/monitor patient progress toward functional and medical goals ? ?Care  Tool: ? ?Bathing ?   ?Body parts bathed by patient: Right arm, Abdomen, Chest, Front perineal area, Right upper leg, Left upper leg  ? Body parts bathed by helper: Left arm ?  ?  ?Bathing assist Assist Level: Maximal Assistance - Patient 24 - 49% ?  ?  ?Upper Body Dressing/Undressing ?Upper body dressing   ?What is the patient wearing?: Pull over shirt ?   ?Upper body assist Assist Level: Moderate Assistance - Patient 50 - 74% ?   ?Lower Body Dressing/Undressing ?Lower body dressing ? ? ?   ?What is the patient wearing?: Pants, Underwear/pull up ? ?  ? ?Lower body assist Assist for lower body dressing: 2 Helpers (MOD A +2) ?   ? ?Toileting ?Toileting Toileting Activity did not occur (Probation officer and hygiene only): N/A (no void or bm)  ?Toileting assist Assist for toileting: Dependent - Patient 0% ?  ?  ?Transfers ?Chair/bed transfer ? ?Transfers assist ?   ? ?Chair/bed transfer assist level: Moderate Assistance - Patient 50 - 74% (squat pivot) ?  ?  ?Locomotion ?Ambulation ? ? ?Ambulation assist ? ? Ambulation activity did not occur: Safety/medical concerns ? ?Assist level: 2 helpers ?Assistive device: Lite Gait ?Max distance: 163ft  ? ?  Walk 10 feet activity ? ? ?Assist ? Walk 10 feet activity did not occur: Safety/medical concerns ? ?  ?   ? ?Walk 50 feet activity ? ? ?Assist Walk 50 feet with 2 turns activity did not occur: Safety/medical concerns ? ?  ?   ? ? ?Walk 150 feet activity ? ? ?Assist Walk 150 feet activity did not occur: Safety/medical concerns ? ?  ?  ?  ? ?Walk 10 feet on uneven surface  ?activity ? ? ?Assist Walk 10 feet on uneven surfaces activity did not occur: Safety/medical concerns ? ? ?  ?   ? ?Wheelchair ? ? ? ? ?Assist Is the patient using a wheelchair?: Yes (Per PT long-term goals) ?Type of Wheelchair: Manual ?Wheelchair activity did not occur: Safety/medical concerns ? ?  ?   ? ? ?Wheelchair 50 feet with 2 turns activity ? ? ? ?Assist ? ?  ?Wheelchair 50 feet with 2 turns activity  did not occur: Safety/medical concerns ? ? ?   ? ?Wheelchair 150 feet activity  ? ? ? ?Assist ? Wheelchair 150 feet activity did not occur: Safety/medical concerns ? ? ?   ? ?Blood pressure 109/74, pulse 83, temperature 98.4 ?F (36.9 ?C), temperature source Oral, resp. rate 18, height 5\' 8"  (1.727 m), weight 106 kg, SpO2 96 %. ? ?Medical Problem List and Plan: ?1. 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            -patient may not shower ?            -ELOS/Goals: 5/19 min/mod A ?           Continue CIR- PT, OT and SLP ?2.  Antithrombotics: ?-DVT/anticoagulation:  Pharmaceutical: Lovenox added as 5 days out from [facility] ?            -antiplatelet therapy:  on ASA/Brillinta ?3. Pain Management: Tylenol prn.  ?4. Mood: LCSW to follow for evaluation and support when appropriate.  ?            -antipsychotic agents: N/A ?5. Neuropsych: This patient is not capable of making decisions on his own behalf. ?6. Skin/Wound Care: Routine pressure relief measures.  ?7. Fluids/Electrolytes/Nutrition: Will monitor I/O. CMET ok except low alb  ?8. Hyponatremia: Stable. Monitor with serial checks.  ?9. Prediabetes: Hgb A1C-6.0. fair control  ?-CBG (last 3)  ?Recent Labs  ?  02/28/22 ?1133 02/28/22 ?1656 02/28/22 ?2109  ?GLUCAP 116* 136* 122*  ?51 CBGs controlled- con't regimen ?10. Dysphagia: D3 thin liq  ?          eating 75-100% meals  ?11. Leucocytosis: resolved  ?         ? ?  Latest Ref Rng & Units 03/01/2022  ?  6:01 AM 02/22/2022  ?  6:09 AM 02/19/2022  ?  5:27 AM  ?CBC  ?WBC 4.0 - 10.5 K/uL 6.8   8.8   8.7    ?Hemoglobin 13.0 - 17.0 g/dL 15.2   15.1   15.8    ?Hematocrit 39.0 - 52.0 % 43.0   43.5   45.3    ?Platelets 150 - 400 K/uL 239   222   186    ?  ?12. Elevated LDL: continue Lipitor. ?13.  AKI: resolved although BUN mildly elevated enc po fluid  ? 5/1- Cr 1.08 and BUN doing well at 18= con't to monitor ? ?  Latest Ref Rng & Units 03/01/2022  ?  6:01 AM 02/22/2022  ?  6:09 AM 02/21/2022  ?  6:36 AM  ?BMP  ?Glucose 70 - 99  mg/dL 112   142   152    ?BUN 6 - 20 mg/dL 18   25   23     ?Creatinine 0.61 - 1.24 mg/dL 1.08   1.06   1.01    ?Sodium 135 - 145 mmol/L 137   137   137    ?Potassium 3.5 - 5.1 mmol/L 3.7   4.1   4.1    ?Chloride 98 - 111 mmol/L 105   106   105    ?CO2 22 - 32 mmol/L 24   24   24     ?Calcium 8.9 - 10.3 mg/dL 8.8   8.5   8.7    ? ? ?14. Hypocalcemia:  Ionized Ca- 1.11 with normal albumin-4.1.  ?--Add calcium supplement bid.  ?15. Elevated Homocysteine levels:  B12, folate WNL--per Dr. Erlinda Hong , neuro, who felt no treatment needed as less than 50.  ?16. Obesity: BMI 35.53- provide dietary education. ? 17. Rash- resolved, ? Contact dermatitis ?  ? ? ? ?LOS: ?11 days ?A FACE TO FACE EVALUATION WAS PERFORMED ? ?Adri Schloss ?03/01/2022, 8:24 AM  ? ? ? ?

## 2022-03-01 NOTE — Progress Notes (Signed)
Speech Language Pathology Daily Session Note ? ?Patient Details  ?Name: [name] ?MRN: [id number] ?Date of Birth: 1961-04-11 ? ?Today's Date: 03/01/2022 ?SLP Individual Time: [id number] ?SLP Individual Time Calculation (min): 28 min ? ?Short Term Goals: ?Week 2: SLP Short Term Goal 1 (Week 2): Patient will consume current diet with minimal s/s of aspiration with with min A cues to implement safe swallowing strategies/precautions (slow rate, small bites/sips) ?SLP Short Term Goal 2 (Week 2): Patient will respond to yes/no questions via multimodal means with mod A multimodal cues to achieve 80% accuracy ?SLP Short Term Goal 3 (Week 2): Patient will ID field of 3 written words by attribute/function/features with mod A multimodal cues to achieve 75% accuracy ?SLP Short Term Goal 4 (Week 2): Pt will ID field of 3 objects attribute/function/features with mod A multimodal cues to achieve 75% accuracy ?SLP Short Term Goal 5 (Week 2): Patient complete object-to-word matching with mod A multimodal cues to achieve 75% accuracy ?SLP Short Term Goal 6 (Week 2): Pt will approximate/imitate at the sound level with max A to achieve 50% accuracy with max A multimodal cues. ? ?Skilled Therapeutic Interventions:Skilled ST services focused on language skills. Pt demonstrated ability to respond to basic yes/no questions pertaining to biographical/environmental information in 4/5 opportunities. Pt demonstrated ability to identify common objects in a field of 2 and then 3 with 4/5 accuracy improving to 5/5 accuracy with function demonstration cues. Pt demonstrated ability to identify photographs of objects in a field if 2 in 3/3 opportunities, black/white drawing of objects in a field of 2 in 3/3 opportunities, object names in a field of 2-3 in 3/3 opportunities. SLP presented black/white ADL picture boards to aid in communication, pt could select requested picture in 6/6 (6 cell board) , in 7/8 (8 cell board), but increased difficulty  using a 16 cell board. SLP communicated with incoming SLP to continue assessment of functional skills utilizing a communication picture board. Pt was left in room with call bell within reach and chair alarm set. SLP recommends to continue skilled services. ?   ? ?Pain ?Pain Assessment ?Pain Score: 0-No pain ? ?Therapy/Group: Individual Therapy ? ?Chantilly Linskey ?03/01/2022, 1:51 PM ?

## 2022-03-01 NOTE — Progress Notes (Signed)
Occupational Therapy Session Note ? ?Patient Details  ?Name: Gordon Fisher ?MRN: [id number] ?Date of Birth: [birth year] ? ?Today's Date: 03/01/2022 ?OT Individual Time: 2633-3545 ?OT Individual Time Calculation (min): 58 min  ? ? ?Short Term Goals: ?Week 2:  OT Short Term Goal 1 (Week 2): pt will complete 1/3 toileting tasks with MOD A ?OT Short Term Goal 2 (Week 2): pt will sit>stand from EOB with MIN A +2 during LB dressing ?OT Short Term Goal 3 (Week 2): pt will initiate hemi technique for UB dressing with < 3 verbal cues ?OT Short Term Goal 4 (Week 2): pt will initiate grooming tasks at sink with MIN A ? ?Skilled Therapeutic Interventions/Progress Updates:  ?Pt greeted supine in bed, nodding in agreement to OT intervention. Session focus on BADL reeducation, functional mobility, RUE AAROM, dynamic standing balance and decreasing overall caregiver burden.           ?Pt completed supine>sit to R side with MODA to maneuver RLE to EOB. Pt completed squat pivot to w/c with L side with MOD A. Pt transported with total A to shower with pt able to complete squat pivot to TTB to R side. Pt completed bathing from shower seat with overall MAX A. Pt exited shower with stedy with pt able to stand to stedy with MINA, total A transport out of shower in stedy. Pt completed dressing from EOB with MOD A for UB dressing, pt was able to recall which UE needed to be donned first during UB dressing by reaching towards RUE. Pt needed MOD A +2 to don pants from EOB needing assist to thread RLE but abel to thread LLE with MIN A for dynamic sitting balance, pt sit>stand with MOD A +2 to pull pants up to waist line. Pt completed squat pivot to w/c with L side with MOD A +1. Pt completed grooming tasks at sink with MIN A, pt able to recall steps of oral care with less cues this session. Pt transported ot gym with total A where pt completed below BUE therex to increase RUE attention and AAROM with unweighted dowel rod:                ?2x10  chest presses ?2x10 forward rows ?2x10 backwards row ? ?AAROM with RUE on therapy ball with an emphasis on scapular protraction/retraction, no active movement noted.  ?Pt transported back to room in w/c with total A where pt left up in TIS, semi reclined with alarm belt activated and all needs within reach.  ? ?Therapy Documentation ?Precautions:  ?Precautions ?Precautions: Fall, Other (comment) ?Precaution Comments: R hemi, global aphasia, cortrak ?Restrictions ?Weight Bearing Restrictions: No ? ?Pain: pt grimacing when OTA touched, RLE calf area, rest breaks and repositioning provided as needed.  ? ? ? ?Therapy/Group: Individual Therapy ? ?Barron Schmid ?03/01/2022, 9:00 AM ?

## 2022-03-01 NOTE — Progress Notes (Signed)
Speech Language Pathology Daily Session Note ? ?Patient Details  ?Name: Gordon Fisher ?MRN: [id number] ?Date of Birth: 09-24-1961 ? ?Today's Date: 03/01/2022 ?SLP Individual Time: 1000-1055 ?SLP Individual Time Calculation (min): 55 min ? ?Short Term Goals: ?Week 2: SLP Short Term Goal 1 (Week 2): Patient will consume current diet with minimal s/s of aspiration with with min A cues to implement safe swallowing strategies/precautions (slow rate, small bites/sips) ?SLP Short Term Goal 2 (Week 2): Patient will respond to yes/no questions via multimodal means with mod A multimodal cues to achieve 80% accuracy ?SLP Short Term Goal 3 (Week 2): Patient will ID field of 3 written words by attribute/function/features with mod A multimodal cues to achieve 75% accuracy ?SLP Short Term Goal 4 (Week 2): Pt will ID field of 3 objects attribute/function/features with mod A multimodal cues to achieve 75% accuracy ?SLP Short Term Goal 5 (Week 2): Patient complete object-to-word matching with mod A multimodal cues to achieve 75% accuracy ?SLP Short Term Goal 6 (Week 2): Pt will approximate/imitate at the sound level with max A to achieve 50% accuracy with max A multimodal cues. ? ?Skilled Therapeutic Interventions: ?Pt seen this date for skilled ST intervention targeting communication goals outlined above. Pt encountered OOB in TIS; sleeping. Aroused to name. Agreeable to ST intervention. When asked if he would like to stay in his room or go to the speech office, pt made request to go to speech office with Max multimodal cues and yes/no question prompts. ?  ?SLP facilitated today's session by providing the following skilled interventions during therapeutic and functional tx tasks: CART techniques, articulatory placement cues, biofeedback with use of mirror for oral posturing during articulation task, reinforcement re: use of low-tech AAC picture boards, and use of supported communication interventions. ? ?Pt participated in verbal  production of vowels and bilabial and velar consonants with Max multimodal A with ~50% accuracy. Pt verbalized an approximation of his name "Gordon Fisher" given Max multimodal A faded to Min-Mod verbal A with repetition. Pt demonstrates verbal perseveration with his name and unable to verbalize different sounds without Max A. Completed spelling task of body parts on iPad application with 488% accuracy given [facility] for moving letters to appropriate location. Accurately pointed to pictures on communication board given hypothetical scenarios with ~80% accuracy given Min to Mod multimodal cues for comprehension. Unable to initiate use of low-tech AAC boards, but successfully answered yes/no questions re: want/needs when therapist pointed to picture, more readily. Functional communication methods continue to be use of yes/no questions and written choice prompts in field of 2-4. Intermittently, pt would nod his head "yes," when he meant "no," or say "yep" while shaking his head "no." Pt benefited from question being repeated with accompanying hand gestures, pictures, or written prompts. Pt answered all personally-relevant questions re: name, place of residency, and family with 100% accuracy given written choice prompts in field of 2 to 4. Traced first and last name, city of residency, and wife's name with Supervision A. Pt took back to room at the end of today's session.  ?  ?Session concluded with pt in TIS, safety belt donned, call bell reviewed and within reach, and all immediate needs met. Continue per current POC. ? ?Pain ?Denies pain though unable to fully express wants/needs at times; did not appear in any discomfort or distress via body language. ? ?Therapy/Group: Individual Therapy ? ?Apurva Reily A Dilia Alemany ?03/01/2022, 1:53 PM ?

## 2022-03-01 NOTE — Progress Notes (Signed)
Physical Therapy Session Note ? ?Patient Details  ?Name: Gordon Fisher ?MRN: [id number] ?Date of Birth: 01/01/1961 ? ?Today's Date: 03/01/2022 ?PT Individual Time: 1100-1155 ?PT Individual Time Calculation (min): 55 min  ? ?Short Term Goals: ?Week 2:  PT Short Term Goal 1 (Week 2): Pt will perform supine<>sit with min assist ?PT Short Term Goal 2 (Week 2): Pt will perform sit<>stands using LRAD with mod assist  of 1 ?PT Short Term Goal 3 (Week 2): Pt will perform bed<>chair transfers using LRAD wtih mod assist of 1 ?PT Short Term Goal 4 (Week 2): Pt will ambulate at least 15ft using LRAD with max assist of 1 and +2 min assist ?PT Short Term Goal 5 (Week 2): Pt will participate in [place] ? ?Skilled Therapeutic Interventions/Progress Updates:  ?  Patient received sitting up in wc, agreeable to PT. He initially denied pain by shaking his head "no," but saying "yes" when asked. Throughout session, patient grunting with R knee movement, but unable to clearly specify whether he was having pain or not. PT transporting patient in wc to therapy gym for time management and energy conservation. PT donning sneakers and R AFO TotalA for time. He stood with Geologist, engineering for visual feedback and ModA. Patient initially able to maintain midline, but upon PT cuing patient to initiate dynamic task of shifting weight L, patient resuming heavy pushing to the R. R knee maintaining excessive flexion. PT attempting tapping, stroking and vibration to facilitation R knee extension in stance. Patient not responsive to multimodal cuing to flex L knee in stance. Patient completing lateral reaching task to the L to facilitate L weight shift. Patient with significant difficulty completing novel tasks and non-automatic tasks. Patient unable to sufficiently weight shift R to advance L LE to target. Patient returning to room in wc, seatbelt alarm on, call light within reach, tilted back in TIS.  ? ?Therapy Documentation ?Precautions:   ?Precautions ?Precautions: Fall, Other (comment) ?Precaution Comments: R hemi, global aphasia, cortrak ?Restrictions ?Weight Bearing Restrictions: No ? ? ? ?Therapy/Group: Individual Therapy ? ?Debbora Dus ?Debbora Dus, PT, DPT, CBIS ? ?03/01/2022, 7:42 AM  ?

## 2022-03-02 LAB — GLUCOSE, CAPILLARY
Glucose-Capillary: 109 mg/dL — ABNORMAL HIGH (ref 70–99)
Glucose-Capillary: 93 mg/dL (ref 70–99)
Glucose-Capillary: 124 mg/dL — ABNORMAL HIGH (ref 70–99)

## 2022-03-02 NOTE — Progress Notes (Signed)
Pt toileted, peri care performed, clean brief applied, patient returned to bed, RLE foot brace, and RUE brace applied. Pt resting comfortably w/ family at bedside.  ?

## 2022-03-02 NOTE — Progress Notes (Signed)
Occupational Therapy Session Note ? ?Patient Details  ?Name: Gordon Fisher ?MRN: [id number] ?Date of Birth: [birth year] ? ?Today's Date: 03/02/2022 ?OT Individual Time: 2876-8115 ?OT Individual Time Calculation (min): 30 min  ? ? ?Short Term Goals: ?Week 1:  OT Short Term Goal 1 (Week 1): Pt will maintain static sitting balance EOB with min A >5 min. ?OT Short Term Goal 1 - Progress (Week 1): Met ?OT Short Term Goal 2 (Week 1): Pt will complete toilet transfer with max A of 2 and no lift. ?OT Short Term Goal 2 - Progress (Week 1): Met ?OT Short Term Goal 3 (Week 1): Pt will don shirt in support sitting with min A. ?OT Short Term Goal 3 - Progress (Week 1): Progressing toward goal ?OT Short Term Goal 4 (Week 1): Pt will position RUE on pillow with max A. ?OT Short Term Goal 4 - Progress (Week 1): Met ? ?Skilled Therapeutic Interventions/Progress Updates:  ?  1:1. Pt received in TIS agreeable to therapy with no pain or toileting needs. Pt agreeable to work on hemi arm. Variety of strategies utilized  while seated in TIS such as towel slides scap facilitation, chair rocking (total A for grasp) and gravity assisted movement with tapping. Best movement noted with trace bicep activation with TIS tilted back and arm in gravity assisted position with pt intermittently able to activate bicep to "touch his face." Pt with good visual attention on RUE throughtu tasks. ?Exited session with pt seated in TIS, exit alarm on and call light in reach ? ? ?Therapy Documentation ?Precautions:  ?Precautions ?Precautions: Fall, Other (comment) ?Precaution Comments: R hemi, global aphasia, cortrak ?Restrictions ?Weight Bearing Restrictions: No ?General: ?  ? ? ? ?Therapy/Group: Individual Therapy ? ?Lowella Dell Tena Linebaugh ?03/02/2022, 2:34 PM ?

## 2022-03-02 NOTE — Progress Notes (Signed)
Rn offered and toileted patient at this time. Pt voided w/o difficulty, soiled brief replaced with clean brief, patient dressed and returned to wheelchair, w/ lap alarm in place and active.  ?

## 2022-03-02 NOTE — Progress Notes (Signed)
?                                                       PROGRESS NOTE ? ? ?Subjective/Complaints: ?No issues overnite , still req 2 person assist for dressing, can do a steady transfer of 1 ? ?ROS-  ?Limited by aphasia ?Objective: ?  ?No results found. ?Recent Labs  ?  03/01/22 ?0601  ?WBC 6.8  ?HGB 15.2  ?HCT 43.0  ?PLT 239  ? ? ?Recent Labs  ?  03/01/22 ?0601  ?NA 137  ?K 3.7  ?CL 105  ?CO2 24  ?GLUCOSE 112*  ?BUN 18  ?CREATININE 1.08  ?CALCIUM 8.8*  ? ? ? ?Intake/Output Summary (Last 24 hours) at 03/02/2022 [id number] ?Last data filed at 03/02/2022 0756 ?Gross per 24 hour  ?Intake 960 ml  ?Output 0 ml  ?Net 960 ml  ? ?  ? ?  ? ?Physical Exam: ?Vital Signs ?Blood pressure 112/75, pulse 78, temperature 98 ?F (36.7 ?C), resp. rate 18, height 5\' 8"  (1.727 m), weight 106 kg, SpO2 95 %. ? ? ? ? 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 ?Neurologic: Cranial nerves II through XII intact, motor strength is 5/5 in left and 0/5 RIght deltoid, bicep, tricep, grip, hip flexor, knee extensors, ankle dorsiflexor and plantar flexor ?Trace Right hip add ?Able to follow simple commands but perseverates  ?Musculoskeletal: Full range of motion in all 4 extremities. No joint swelling ? ? ? ?Assessment/Plan: ?1. Functional deficits which require 3+ hours per day of interdisciplinary therapy in a comprehensive inpatient rehab setting. ?Physiatrist is providing close team supervision and 24 hour management of active medical problems listed below. ?Physiatrist and rehab team continue to assess barriers to discharge/monitor patient progress toward functional and medical goals ? ?Care  Tool: ? ?Bathing ?   ?Body parts bathed by patient: Right arm, Front perineal area, Right upper leg, Left upper leg, Face  ? Body parts bathed by helper: Left arm, Right lower leg, Left lower leg ?  ?  ?Bathing assist Assist Level: Maximal Assistance - Patient 24 - 49% ?  ?  ?Upper Body Dressing/Undressing ?Upper body dressing   ?What is the patient wearing?: Pull over shirt ?   ?Upper body assist Assist Level: Moderate Assistance - Patient 50 - 74% ?   ?Lower Body Dressing/Undressing ?Lower body dressing ? ? ?   ?What is the patient wearing?: Pants, Underwear/pull up ? ?  ? ?Lower body assist Assist for lower body dressing: 2 Helpers ?   ? ?Toileting ?Toileting Toileting Activity did not occur (Probation officer and hygiene only): N/A (no void or bm)  ?Toileting assist Assist for toileting: Dependent - Patient 0% ?  ?  ?Transfers ?Chair/bed transfer ? ?Transfers assist ?   ? ?Chair/bed transfer assist level: Moderate Assistance - Patient 50 - 74% (squat pivot to L side) ?  ?  ?Locomotion ?Ambulation ? ? ?Ambulation assist ? ? Ambulation activity did not occur: Safety/medical concerns ? ?Assist level: 2 helpers ?Assistive device: Lite Gait ?  Max distance: 137ft  ? ?Walk 10 feet activity ? ? ?Assist ? Walk 10 feet activity did not occur: Safety/medical concerns ? ?  ?   ? ?Walk 50 feet activity ? ? ?Assist Walk 50 feet with 2 turns activity did not occur: Safety/medical concerns ? ?  ?   ? ? ?Walk 150 feet activity ? ? ?Assist Walk 150 feet activity did not occur: Safety/medical concerns ? ?  ?  ?  ? ?Walk 10 feet on uneven surface  ?activity ? ? ?Assist Walk 10 feet on uneven surfaces activity did not occur: Safety/medical concerns ? ? ?  ?   ? ?Wheelchair ? ? ? ? ?Assist Is the patient using a wheelchair?: Yes (Per PT long-term goals) ?Type of Wheelchair: Manual ?Wheelchair activity did not occur: Safety/medical concerns ? ?  ?   ? ? ?Wheelchair 50 feet with 2 turns activity ? ? ? ?Assist ? ?  ?Wheelchair 50 feet  with 2 turns activity did not occur: Safety/medical concerns ? ? ?   ? ?Wheelchair 150 feet activity  ? ? ? ?Assist ? Wheelchair 150 feet activity did not occur: Safety/medical concerns ? ? ?   ? ?Blood pressure 112/75, pulse 78, temperature 98 ?F (36.7 ?C), resp. rate 18, height 5\' 8"  (1.727 m), weight 106 kg, SpO2 95 %. ? ?Medical Problem List and Plan: ?1. 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            -patient may not shower ?            -ELOS/Goals: 5/19 min/mod A ?           Continue CIR- PT, OT and SLP ?2.  Antithrombotics: ?-DVT/anticoagulation:  Pharmaceutical: Lovenox added as 5 days out from [facility] ?            -antiplatelet therapy:  on ASA/Brillinta ?3. Pain Management: Tylenol prn.  ?4. Mood: LCSW to follow for evaluation and support when appropriate.  ?            -antipsychotic agents: N/A ?5. Neuropsych: This patient is not capable of making decisions on his own behalf. ?6. Skin/Wound Care: Routine pressure relief measures.  ?7. Fluids/Electrolytes/Nutrition: Will monitor I/O. CMET ok except low alb  ?8. Hyponatremia: Stable. Monitor with serial checks.  ?9. Prediabetes: Hgb A1C-6.0. fair control  ?-CBG (last 3)  ?Recent Labs  ?  03/01/22 ?1618 03/01/22 ?2105 03/02/22 ?[id number]  ?GLUCAP 126* 164* 109*  ? ?5/2CBGs controlled- off TF may d/c cbg ?10. Dysphagia: D3 thin liq  ?          eating 75-100% meals  ?11. Leucocytosis: resolved  ?         ? ?  Latest Ref Rng & Units 03/01/2022  ?  6:01 AM 02/22/2022  ?  6:09 AM 02/19/2022  ?  5:27 AM  ?CBC  ?WBC 4.0 - 10.5 K/uL 6.8   8.8   8.7    ?Hemoglobin 13.0 - 17.0 g/dL 15.2   15.1   15.8    ?Hematocrit 39.0 - 52.0 % 43.0   43.5   45.3    ?Platelets 150 - 400 K/uL 239   222   186    ?  ?12. Elevated LDL: continue Lipitor. ?13.  AKI: resolved although BUN mildly elevated enc po fluid  ? 5/1- Cr 1.08 and BUN doing well at 18= con't to monitor ? ?  Latest Ref Rng & Units 03/01/2022  ?  6:01  AM 02/22/2022  ?  6:09 AM 02/21/2022  ?  6:36 AM  ?BMP  ?Glucose 70 - 99  mg/dL 112   142   152    ?BUN 6 - 20 mg/dL 18   25   23     ?Creatinine 0.61 - 1.24 mg/dL 1.08   1.06   1.01    ?Sodium 135 - 145 mmol/L 137   137   137    ?Potassium 3.5 - 5.1 mmol/L 3.7   4.1   4.1    ?Chloride 98 - 111 mmol/L 105   106   105    ?CO2 22 - 32 mmol/L 24   24   24     ?Calcium 8.9 - 10.3 mg/dL 8.8   8.5   8.7    ? ? ?14. Hypocalcemia:  Ionized Ca- 1.11 with normal albumin-4.1.  ?--Add calcium supplement bid.  ?15. Elevated Homocysteine levels:  B12, folate WNL--per Dr. Erlinda Hong , neuro, who felt no treatment needed as less than 50.  ?16. Obesity: BMI 35.53- provide dietary education. ? ?  ? ? ? ?LOS: ?12 days ?A FACE TO FACE EVALUATION WAS PERFORMED ? ?Luanna Salk Taniyah Ballow ?03/02/2022, 8:22 AM  ? ? ? ?

## 2022-03-02 NOTE — Plan of Care (Signed)
?  Problem: Consults ?Goal: RH STROKE PATIENT EDUCATION ?Description: See Patient Education module for education specifics  ?Outcome: Progressing ?  ?Problem: RH BOWEL ELIMINATION ?Goal: RH STG MANAGE BOWEL WITH ASSISTANCE ?Description: STG Manage Bowel with mod I Assistance. ?Outcome: Progressing ?Goal: RH STG MANAGE BOWEL W/MEDICATION W/ASSISTANCE ?Description: STG Manage Bowel with Medication with mod I Assistance. ?Outcome: Progressing ?  ?Problem: RH SAFETY ?Goal: RH STG ADHERE TO SAFETY PRECAUTIONS W/ASSISTANCE/DEVICE ?Description: STG Adhere to Safety Precautions With cues Assistance/Device. ?Outcome: Progressing ?  ?Problem: RH PAIN MANAGEMENT ?Goal: RH STG PAIN MANAGED AT OR BELOW PT'S PAIN GOAL ?Description: At or below level 4 with prns ?Outcome: Progressing ?  ?Problem: RH KNOWLEDGE DEFICIT ?Goal: RH STG INCREASE KNOWLEDGE OF DIABETES ?Description: Patient and spouse will be able to manage prediabetes with dietary modifications using handouts and educational materials independently ?Outcome: Progressing ?Goal: RH STG INCREASE KNOWLEDGE OF DYSPHAGIA/FLUID INTAKE ?Description: Patient and spouse will be able to manage dysphagia, medications and dietary modifications using handouts and educational materials independently ?Outcome: Progressing ?Goal: RH STG INCREASE KNOWLEGDE OF HYPERLIPIDEMIA ?Description: Patient and spouse will be able to manage HLD with medications and dietary modifications using handouts and educational materials independently ?Outcome: Progressing ?Goal: RH STG INCREASE KNOWLEDGE OF STROKE PROPHYLAXIS ?Description: Patient and spouse will be able to manage secondary stroke risks with medications and dietary modifications using handouts and educational materials independently ?Outcome: Progressing ?  ?

## 2022-03-02 NOTE — Progress Notes (Signed)
Speech Language Pathology Daily Session Note ? ?Patient Details  ?Name: Gordon Fisher ?MRN: [id number] ?Date of Birth: 25-Jun-1961 ? ?Today's Date: 03/02/2022 ?SLP Individual Time: 1004-1100 ?SLP Individual Time Calculation (min): 56 min ? ?Short Term Goals: ?Week 2: SLP Short Term Goal 1 (Week 2): Patient will consume current diet with minimal s/s of aspiration with with min A cues to implement safe swallowing strategies/precautions (slow rate, small bites/sips) ?SLP Short Term Goal 2 (Week 2): Patient will respond to yes/no questions via multimodal means with mod A multimodal cues to achieve 80% accuracy ?SLP Short Term Goal 3 (Week 2): Patient will ID field of 3 written words by attribute/function/features with mod A multimodal cues to achieve 75% accuracy ?SLP Short Term Goal 4 (Week 2): Pt will ID field of 3 objects attribute/function/features with mod A multimodal cues to achieve 75% accuracy ?SLP Short Term Goal 5 (Week 2): Patient complete object-to-word matching with mod A multimodal cues to achieve 75% accuracy ?SLP Short Term Goal 6 (Week 2): Pt will approximate/imitate at the sound level with max A to achieve 50% accuracy with max A multimodal cues. ? ?Skilled Therapeutic Interventions: Skilled ST treatment focused on language skills. Patient demonstrated ability to respond to basic yes/no questions pertaining to biographical and environmental content with 100% accuracy; ~80% effectiveness/consistency with multimodal communication via head nods, thumbs up/down. Pt most consistent with thumbs up/down as compared to head nods. He was encouraged to use gestures in additional to verbal response to maximize clarity. Verbal responses continue to consist of different variations/intonation patterns of "yeah/yep". Pt has not been witnessed to spontaneously produce "no", nor was he able to verbally repeat this date despite max A multimodal cues. Pt was able to produce /n/ and vowel "oh" in isolation however when  attempted in combination pt perseverated on "lock/luck." SLP provided max A multimodal cues during additional articulation task to produce bilabial and velar consonants (/m/, /k/). Pt approximated his name "Gordon Fisher" with distorted vowel which resulted in pronunciation "Muck" or "Mack." Pt identified pictures/text on low-tech AAC board with 100% accuracy with 6-cell board and mod I; 50% accuracy with 8-cell board w/ min A. Question visual deficits impacting accuracy of 6+ images as pt appeared to squint more with larger fields. SLP facilitated object-to-word matching with 100% accuracy in field of 4; and object-to-phrase description with 100% accuracy in field of 4 with sup A verbal cus. Patient was left in wheelchair with alarm activated and immediate needs within reach at end of session. Continue per current plan of care.   ?   ?Pain ? None/denied. ? ?Therapy/Group: Individual Therapy ? ?Ninel Abdella T Alem Fahl ?03/02/2022, 12:25 PM ?

## 2022-03-02 NOTE — Progress Notes (Signed)
Physical Therapy Session Note ? ?Patient Details  ?Name: Gordon Fisher ?MRN: [id number] ?Date of Birth: [birth year] ? ?Today's Date: 03/02/2022 ?PT Individual Time: 0347-4259 ?PT Individual Time Calculation (min): 53 min  ? ?Short Term Goals: ?Week 2:  PT Short Term Goal 1 (Week 2): Pt will perform supine<>sit with min assist ?PT Short Term Goal 2 (Week 2): Pt will perform sit<>stands using LRAD with mod assist  of 1 ?PT Short Term Goal 3 (Week 2): Pt will perform bed<>chair transfers using LRAD wtih mod assist of 1 ?PT Short Term Goal 4 (Week 2): Pt will ambulate at least 28ft using LRAD with max assist of 1 and +2 min assist ?PT Short Term Goal 5 (Week 2): Pt will participate in [place] Balance Assessment ? ?Skilled Therapeutic Interventions/Progress Updates:  ?  Pt received sitting in TIS w/c and agreeable to therapy session. Pt does well responding with yes/no head nods and thumbs up/down - pt also able to use pictures of toileting to confirm that he does not need to void at this time.  Transported to/from gym in w/c for time management and energy conservation. ? ?Pt already wearing R LE [place] Walk-on Reaction GRAFO and donned leg lifter to facilitate swing phase advancement during gait training. ? ?Sit<>stands, no UE support, with CGA for steadying while rising to stand but once upright requires mod assist of 1 for balance due to R lean/pushing. ? ?Gait training 46ft + 63ft + 102ft +31ft with +2 heavy mod assist for balance and R LE gait mechanics with 3rd person to retrieve wheelchair and to trial various cuing strategies to improve gait mechanics - provided the following cuing, manual facilitation, and utilized the following cuing strategies: ?- continues to have strong R pushing with anterior lean bias that worsens with fatigue requiring heavy assist to maintain balance  ?- requires total assist to advance R LE during swing using leg lifter ?- blocking R knee and facilitating increased knee extension during  stance, demos improving R knee stability ?- continues to have step-to pattern leading with R LE and pt not willing to step L LE past R LE despite multiple attempts at facilitating/cuing for this, were able to slightly advance L past but inconsistently ?- tried visual target of disks to promote reciprocal pattern; however, difficult to place targets quickly enough for pt to step - visual cuing was helpful to increase step lengths but resulted in pt having excessive anterior trunk flexion to look down at targets ?- tried mirror feedback throughout gait with cuing to maintain upwards gaze and to promote improved midline orientation with decreased R leaning, but limited improvement ? ?Transported back to room and pt left seated in TIS w/c with needs in reach, R UE supported on pillow, seat belt alarm on, and soft call button in pt's lap. ? ? ?Therapy Documentation ?Precautions:  ?Precautions ?Precautions: Fall, Other (comment) ?Precaution Comments: R hemi, global aphasia, cortrak ?Restrictions ?Weight Bearing Restrictions: No ? ? ?Pain: ?Due to global aphasia pt unable to verbalize pain, but does continue to have some moaning, grunting with certain movements of R LE but when asked, pt able to use head nods and thumbs down to deny pain. ? ? ? ?Therapy/Group: Individual Therapy ? ?Ginny Forth , PT, DPT, NCS, CSRS ?03/02/2022, 7:59 AM  ?

## 2022-03-02 NOTE — Progress Notes (Signed)
Upon shift assessment of patient, offered toileting to patient however patient was grossly soiled and required a new brief and peri care at that time.  ?

## 2022-03-02 NOTE — Progress Notes (Signed)
Occupational Therapy Session Note ? ?Patient Details  ?Name: Gordon Fisher ?MRN: [id number] ?Date of Birth: 1961-02-11 ? ?Today's Date: 03/02/2022 ?OT Individual Time: 6160-7371 ?OT Individual Time Calculation (min): 54 min  ? ? ?Short Term Goals: ?Week 2:  OT Short Term Goal 1 (Week 2): pt will complete 1/3 toileting tasks with MOD A ?OT Short Term Goal 2 (Week 2): pt will sit>stand from EOB with MIN A +2 during LB dressing ?OT Short Term Goal 3 (Week 2): pt will initiate hemi technique for UB dressing with < 3 verbal cues ?OT Short Term Goal 4 (Week 2): pt will initiate grooming tasks at sink with MIN A ? ?Skilled Therapeutic Interventions/Progress Updates:  ?Pt greeted supine in bed, reporting no pain via thumbs down and nodded "yes" in       agreement to OT intervention. Session focus on BADL reeducation, functional mobility, dynamic standing balance and decreasing overall caregiver burden.            ?Pt completed supine>sit to R side of bed with MOD A needed to maneuver RLE to EOB. Pt completed dressing from EOB with MOD A for UB dressing and MOD A +2 for LB dressing via sit>stand. Pt completed squat pivot to w/c to L side with MOD A +1. Pt completed oral care at sink with MINA and MOD verbal/visual cues to sequence correct steps in oral care.  ?Pt transported ot gym with total A where pt engaged in standing therapeutic activity to facilitate NMR to RLE via dynamic reaching with LUE to shift weight to R side. Pt needed MOD A for sit>stand and MODA for upright posture/balance when reaching dynamically. Pt completed x3 trials. Pt needing initial hand over hand assist to reach for clothespins d/t apraxia/language deficits. ?Pt additionally worked on Automatic Data via WB to RUE with lateral leans with pt instructed to reach with LUE to retrieve cup positioned on chair in front of mat to facilitate lateral lean to RUE, minimal activation noted in R tricep to attempt to push back up to sitting with pt needing overall MAX A. Pt  needed multimodal cues to sequence steps related to therapeutic activity d/t apraxia and language deficits.  ?Remainder of session focused on attended estim set at settings below: ?1:1 NMES applied to R flexors at the below settings:  ?  ?Ratio 1:3 ?Rate 35 pps ?Waveform- Asymmetric ?Ramp 1.0 ?Pulse 300 ?Intensity- 34 ?Duration - 6 mins ?At this level noted R digits flexing mostly in thumb ?No pain for adverse skin reactions noted after treatment, pt using thumbs up/down to report pain.  ? Pt transported back to room in w/c with total A where pt left reclined in TIS with alarm belt activated and all needs within reach.                 ? ? ?Therapy Documentation ?Precautions:  ?Precautions ?Precautions: Fall, Other (comment) ?Precaution Comments: R hemi, global aphasia, cortrak ?Restrictions ?Weight Bearing Restrictions: No ? ?Pain: no pain noted during session  ? ? ?Therapy/Group: Individual Therapy ? ?Barron Schmid ?03/02/2022, 11:33 AM ?

## 2022-03-03 LAB — GLUCOSE, CAPILLARY: Glucose-Capillary: 121 mg/dL — ABNORMAL HIGH (ref 70–99)

## 2022-03-03 NOTE — Progress Notes (Signed)
Patient ID: Gordon Fisher, male   DOB: Sep 26, 1961, 61 y.o.   MRN: [id number] ? ?Team Conference Report to Patient/Family ? ?Team Conference discussion was reviewed with the patient and caregiver, including goals, any changes in plan of care and target discharge date.  Patient and caregiver express understanding and are in agreement.  The patient has a target discharge date of 03/19/22. ? ?SW met with patient spouse and sister and provided team conference updates. Spouse would like a physician follow up to addressed life after discharge. There is a question about if the patient will discharge on insulin or ensure. Patient spouse feels like the patient is eating but feels like his appetite is not back 100% due to him not eating snacks or food from home. SW will provide patient spouse with disability information. Spouse also inquiring about an additional MRI for patient.  ? ?Dyanne Iha ?03/03/2022, 2:19 PM  ?

## 2022-03-03 NOTE — Progress Notes (Signed)
Speech Language Pathology Daily Session Note ? ?Patient Details  ?Name: Gordon Fisher ?MRN: [id number] ?Date of Birth: [birth year] ? ?Today's Date: 03/03/2022 ?SLP Individual Time: 1761-6073 ?SLP Individual Time Calculation (min): 60 min ? ?Short Term Goals: ?Week 2: SLP Short Term Goal 1 (Week 2): Patient will consume current diet with minimal s/s of aspiration with with min A cues to implement safe swallowing strategies/precautions (slow rate, small bites/sips) ?SLP Short Term Goal 2 (Week 2): Patient will respond to yes/no questions via multimodal means with mod A multimodal cues to achieve 80% accuracy ?SLP Short Term Goal 3 (Week 2): Patient will ID field of 3 written words by attribute/function/features with mod A multimodal cues to achieve 75% accuracy ?SLP Short Term Goal 4 (Week 2): Pt will ID field of 3 objects attribute/function/features with mod A multimodal cues to achieve 75% accuracy ?SLP Short Term Goal 5 (Week 2): Patient complete object-to-word matching with mod A multimodal cues to achieve 75% accuracy ?SLP Short Term Goal 6 (Week 2): Pt will approximate/imitate at the sound level with max A to achieve 50% accuracy with max A multimodal cues. ? ?Skilled Therapeutic Interventions: Skilled ST treatment focused on language goals. SLP facilitated written communication with sup A to initiate pt's first/last name Estrellita Ludwig), spouse's name [name]), grandson's name (Bo), pt's DOB, and age. Pt required mod A for witting sister-in-law and granddaughter's names. Pt benefited from 1-3 written letters by SLP, as well as field of 3 letters to choose from in order to write words in their entirety. SLP facilitated object naming through written responses with mod-to-max A verbal and written cues; pt unable to verbally approximate or repeat at target words. Pt initiated and spelled "tree" and "cup" with 100% accuracy given no cues. He attempted to write the following and benefited from written cues and field of  choices: lemons ("le"), corn ("co"), tractor ("trat"), hammer ("ham"). Pt appeared to recognize spelling errors 70% of occasions.and required max A to repair. Required first 2-3 written letters to complete the words spoon, wrench, and water. SLP educated pt's spouse and sister-in-law regarding writing strategies and other methods of multimodal communication using low-tech communication boards, providing pt with field of 3-4 choices, and written expression. Spouse/SIL verbalized understanding through teach back. Patient was left in wheelchair with alarm activated and immediate needs within reach at end of session. Continue per current plan of care.   ?   ? ?Pain ?Pain Assessment ?Pain Scale: 0-10 ?Pain Score: 0-No pain ?Faces Pain Scale: No hurt ? ?Therapy/Group: Individual Therapy ? ?Sheralyn Pinegar T Phuc Kluttz ?03/03/2022, 12:37 PM ?

## 2022-03-03 NOTE — Progress Notes (Addendum)
Patient toileted. Peri care preformed. ?11:55 Patient declined being toileted ? ?12:51 Patient requested to sit in high back chair after lunch, declined being toileted or urinal, brief dry ? ?1530 Patient offered to be toileted. Patient agreed. Patient brief wet and also urinated in the toilet. Peri care, new brief and pants put on ?  ?1830 Declined toileting Family and grandchildren in the room. Shook head no for toileting and yes to be toilet before laying down.  ?

## 2022-03-03 NOTE — Progress Notes (Signed)
Physical Therapy Session Note ? ?Patient Details  ?Name: Gordon Fisher ?MRN: [id number] ?Date of Birth: 03-11-1961 ? ?Today's Date: 03/03/2022 ?PT Individual Time: 2836-6294 ?PT Individual Time Calculation (min): 73 min  ? ?Short Term Goals: ?Week 2:  PT Short Term Goal 1 (Week 2): Pt will perform supine<>sit with min assist ?PT Short Term Goal 2 (Week 2): Pt will perform sit<>stands using LRAD with mod assist  of 1 ?PT Short Term Goal 3 (Week 2): Pt will perform bed<>chair transfers using LRAD wtih mod assist of 1 ?PT Short Term Goal 4 (Week 2): Pt will ambulate at least 36ft using LRAD with max assist of 1 and +2 min assist ?PT Short Term Goal 5 (Week 2): Pt will participate in Shepherd Balance Assessment ? ?Skilled Therapeutic Interventions/Progress Updates:  ?  Pt received sitting in TIS w/c with nursing staff present having just assisted pt with toileting. Pt's wife, Gordon Fisher, also present and pt agreeable to therapy session. Pt's receptive communication appears to have significant improvements as demonstrated in pt able to follow verbal commands much more quickly and accurately during functional mobility. Also, pt much improved in responding to yes/no questions using thumbs up/down. Therapist educated pt's wife on recommendation for custom wheelchair evaluation - therapist reached out to [employer] to have this scheduled prior to D/C. Therapist also discussed that pt's wife would benefit from at least 3 days of hands-on training to learn how to safely assist patient prior to D/C - will work with whole therapy team and SW to schedule this when appropriate. ? ? Transported to/from gym in w/c for time management and energy conservation. ? ?Pt already wearing R LE [place] Walk-on Reaction GRAFO and donned leg lifter to facilitate swing phase advancement during gait training. ? ?Sit>stands from TIS w/c with light min assist for steadying while rising to stand progressed to mod assist for balance once in standing due to R  lean/pushing - requires dependent manual facilitation to position R LE prior to initiating the transfer.  ? ?Started with dynamic standing balance task of cross body reaching with L UE towards target superiorly forcing L weight shift with trunk/hip/knee extension for upright posture - pt able to activate R quads to improve his L weight shift, but unable to sustain it - requires heavy mod/max assist for balance due to strong R lean that worsens over time. ? ?Gait training 181ft + 82ft + 173ft + 14ft + 75ft all with +2 mod/max assist for balance and R LE gait mechanics with 3rd person present to provide visual cuing for improved gait mechanics as well as bring w/c behind pt for seated rest breaks. Pt requires the following cuing, manual facilitation, and support: ?- determined that 3 Musketeer (over therapist's shoulders) with L UE was more effective at keeping pt's trunk upright because otherwise he has stronger anterior body lean, although still present despite this adjustment in hand positioning ?- continues to have strong R lean/pushing that worsens with fatigue; although improving as pt now able to initiate L weight shift upon cuing to decrease the pushing ?- requires total assist to advance R LE during swing, with increased time and cuing pt able to trace activate swing but insufficient to cause movement - also with increased time allowed for this is requires longer L weight shift which often results in onset of pushing  ?- requires min assist to bock R knee during stance, noticed that once the knee becomes fully extended it causes pt to "jack knife" forward with  excessive anterior trunk lean; improving stance control noted ?- starts with step-to pattern leading with R LE (as therapist facilitates this) and then stepping to it with L LE - 3rd person provided their foot as a visual target for pt to step towards to promote reciprocal stepping pattern, which pt demonstrates great improvement in and has some carry  over in last walk without this visual target - therapist has to allow pt sufficient R weight shift/lean to promote this (difficult as pt already leaning consistently towards R) ? ?Block practice squat pivot transfers w/c<>EOM focusing on motor planning and sequencing - therapist provides visual demonstration and repeated cuing to perform squat towards R because pt will try to stand up fully and therapist will stop him and have him return to sitting and then re-demonstrate how to perform the transfer - progressed to being able to perform it with light mod assist - requires total manual facilitation to position R LE prior to initiating transfer and total assist to manage R UE during the transfer - x4 reps in each direction with pt still having more impaired motor planning towards R.  ? ?Transported back to room and left tilted back in TIS w/c with needs in reach, seat belt alarm on, R UE supported on pillow, and his wife present. ? ? ? ?Therapy Documentation ?Precautions:  ?Precautions ?Precautions: Fall, Other (comment) ?Precaution Comments: R hemi, global aphasia, cortrak ?Restrictions ?Weight Bearing Restrictions: No ? ? ?Pain: ?Pt demos improved tolerance to movement of R LE with only 1x grimacing noticed when pt tilted back too quickly in TIS w/c causing quick stretch of R quad when his knee flexed - otherwise no indications of pain during session. ? ? ? ?Therapy/Group: Individual Therapy ? ?Shelah Heatley M Madell Heino ?03/03/2022, 5:16 PM  ?

## 2022-03-03 NOTE — Progress Notes (Signed)
Occupational Therapy Session Note ? ?Patient Details  ?Name: [name] ?MRN: [id number] ?Date of Birth: 01-Apr-1961 ? ?Today's Date: 03/03/2022 ?OT Individual Time: 9702-6378 ?OT Individual Time Calculation (min): 76 min  ? ? ?Short Term Goals: ?Week 2:  OT Short Term Goal 1 (Week 2): pt will complete 1/3 toileting tasks with MOD A ?OT Short Term Goal 2 (Week 2): pt will sit>stand from EOB with MIN A +2 during LB dressing ?OT Short Term Goal 3 (Week 2): pt will initiate hemi technique for UB dressing with < 3 verbal cues ?OT Short Term Goal 4 (Week 2): pt will initiate grooming tasks at sink with MIN A ? ?Skilled Therapeutic Interventions/Progress Updates:  ?Pt greeted supine in bed agreeable to OT intervention. Session focus on BADL reeducation, functional mobility, RUE AAROM dynamic standing balance and decreasing overall caregiver burden.       ?Pt completed supine>sit to EOB to L side with MOD A to maneuver RLE to EOB.pt completed squat pivot to w/c to L side with MODA. Pt transported to shower with total A in w/c, squat pivot to TTB to R side with MAX A. Pt completed bathing from shower seat with overall MAX A as pt needing increased cues d/t novel task of using bar of soap to wash body, d/t apraxia pt needs increased blocked practice to carryover novel tasks. Pt exited shower via squat pivot to R side with MODA. RN requested pt try try to toilet. Pt completed squat pivot to DABSC in room to L side with MOD A. Pt with +urine void. Donned brief and pants with MODA +2 needing assist for sit>stand and to pull pants up to waist line on R side. Pt donned OH shirt with MODA, again pt more confused completing dressing on BSC vs usual routine of on the EOB. Pt completed oral care at sink with MINA.  ?Pt transproted to gym with total A where pt remainder of session focus on therapeutic activities focused on AAROM with RUE. Reclined pt in TIS and worked on Cytogeneticist with pt instructed to touch nose and  then straighten arm back out, no active movement noted but great attention to R side. Additionally worked Quarry manager with cue of "reach for my shoulder" no active movement noted. Worked on pushing weighted ball forward and backward with RUE on weighted ball to promote NMR, pt needed total A or hand over hand assist to roll ball forward/backward or R and L.  ?Pt transported back to room in w/c with total A where pt left reclined in w/c with all needs within reach and safety belt activated.                   ? ? ?Therapy Documentation ?Precautions:  ?Precautions ?Precautions: Fall, Other (comment) ?Precaution Comments: R hemi, global aphasia, cortrak ?Restrictions ?Weight Bearing Restrictions: No ? ?Pain: no pain indicated during session  ? ? ?Therapy/Group: Individual Therapy ? ?Barron Schmid ?03/03/2022, 12:01 PM ?

## 2022-03-03 NOTE — Progress Notes (Signed)
?                                                       PROGRESS NOTE ? ? ?Subjective/Complaints: ? ?No issues overnite  ?ROS-  ?Limited by aphasia ?Objective: ?  ?No results found. ?Recent Labs  ?  03/01/22 ?0601  ?WBC 6.8  ?HGB 15.2  ?HCT 43.0  ?PLT 239  ? ? ?Recent Labs  ?  03/01/22 ?0601  ?NA 137  ?K 3.7  ?CL 105  ?CO2 24  ?GLUCOSE 112*  ?BUN 18  ?CREATININE 1.08  ?CALCIUM 8.8*  ? ? ? ?Intake/Output Summary (Last 24 hours) at 03/03/2022 0848 ?Last data filed at 03/03/2022 0755 ?Gross per 24 hour  ?Intake 840 ml  ?Output --  ?Net 840 ml  ? ?  ? ?  ? ?Physical Exam: ?Vital Signs ?Blood pressure 100/69, pulse 79, temperature (!) 97.5 ?F (36.4 ?C), resp. rate 16, height 5\' 8"  (1.727 m), weight 106 kg, SpO2 92 %. ? ? ? ? 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 ?Neurologic: Cranial nerves II through XII intact, motor strength is 5/5 in left and 0/5 RIght deltoid, bicep, tricep, grip, hip flexor, knee extensors, ankle dorsiflexor and plantar flexor ?Trace Right hip add ?Able to follow simple commands but perseverates  ?Musculoskeletal: Full range of motion in all 4 extremities. No joint swelling ? ? ? ?Assessment/Plan: ?1. Functional deficits which require 3+ hours per day of interdisciplinary therapy in a comprehensive inpatient rehab setting. ?Physiatrist is providing close team supervision and 24 hour management of active medical problems listed below. ?Physiatrist and rehab team continue to assess barriers to discharge/monitor patient progress toward functional and medical goals ? ?Care Tool: ? ?Bathing ?   ?Body parts bathed by patient: Right arm,  Front perineal area, Right upper leg, Left upper leg, Face  ? Body parts bathed by helper: Left arm, Right lower leg, Left lower leg ?  ?  ?Bathing assist Assist Level: Maximal Assistance - Patient 24 - 49% ?  ?  ?Upper Body Dressing/Undressing ?Upper body dressing   ?What is the patient wearing?: Pull over shirt ?   ?Upper body assist Assist Level: Moderate Assistance - Patient 50 - 74% ?   ?Lower Body Dressing/Undressing ?Lower body dressing ? ? ?   ?What is the patient wearing?: Pants, Underwear/pull up ? ?  ? ?Lower body assist Assist for lower body dressing: 2 Helpers (MOD A +2) ?   ? ?Toileting ?Toileting Toileting Activity did not occur (Probation officer and hygiene only): N/A (no void or bm)  ?Toileting assist Assist for toileting: Dependent - Patient 0% ?  ?  ?Transfers ?Chair/bed transfer ? ?Transfers assist ?   ? ?Chair/bed transfer assist level: Moderate Assistance - Patient 50 - 74% (squat pivot) ?  ?  ?Locomotion ?Ambulation ? ? ?Ambulation assist ? ? Ambulation activity did not occur: Safety/medical concerns ? ?Assist level: 2 helpers ?Assistive device: Lite Gait ?Max distance: 194ft  ? ?Walk 10 feet activity ? ? ?Assist ? Walk  10 feet activity did not occur: Safety/medical concerns ? ?  ?   ? ?Walk 50 feet activity ? ? ?Assist Walk 50 feet with 2 turns activity did not occur: Safety/medical concerns ? ?  ?   ? ? ?Walk 150 feet activity ? ? ?Assist Walk 150 feet activity did not occur: Safety/medical concerns ? ?  ?  ?  ? ?Walk 10 feet on uneven surface  ?activity ? ? ?Assist Walk 10 feet on uneven surfaces activity did not occur: Safety/medical concerns ? ? ?  ?   ? ?Wheelchair ? ? ? ? ?Assist Is the patient using a wheelchair?: Yes (Per PT long-term goals) ?Type of Wheelchair: Manual ?Wheelchair activity did not occur: Safety/medical concerns ? ?  ?   ? ? ?Wheelchair 50 feet with 2 turns activity ? ? ? ?Assist ? ?  ?Wheelchair 50 feet with 2 turns activity did not occur: Safety/medical  concerns ? ? ?   ? ?Wheelchair 150 feet activity  ? ? ? ?Assist ? Wheelchair 150 feet activity did not occur: Safety/medical concerns ? ? ?   ? ?Blood pressure 100/69, pulse 79, temperature (!) 97.5 ?F (36.4 ?C), resp. rate 16, height 5\' 8"  (1.727 m), weight 106 kg, SpO2 92 %. ? ?Medical Problem List and Plan: ?1. 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            -patient may not shower ?            -ELOS/Goals: 5/19 min/mod A ?           Continue CIR- PT, OT and SLP ?2.  Antithrombotics: ?-DVT/anticoagulation:  Pharmaceutical: Lovenox added as 5 days out from [facility] ?            -antiplatelet therapy:  on ASA/Brillinta ?3. Pain Management: Tylenol prn.  ?4. Mood: LCSW to follow for evaluation and support when appropriate.  ?            -antipsychotic agents: N/A ?5. Neuropsych: This patient is not capable of making decisions on his own behalf. ?6. Skin/Wound Care: Routine pressure relief measures.  ?7. Fluids/Electrolytes/Nutrition: Will monitor I/O. CMET ok except low alb  ?8. Hyponatremia: Stable. Monitor with serial checks.  ?9. Prediabetes: Hgb A1C-6.0. fair control  ?-CBG (last 3)  ?Recent Labs  ?  03/02/22 ?1157 03/02/22 ?2100 03/03/22 ?0552  ?GLUCAP 93 124* 121*  ? ?5/2CBGs controlled- off TF may d/c cbg ?10. Dysphagia: D3 thin liq  ?          eating 75-100% meals  ?11. Leucocytosis: resolved  ?         ? ?  Latest Ref Rng & Units 03/01/2022  ?  6:01 AM 02/22/2022  ?  6:09 AM 02/19/2022  ?  5:27 AM  ?CBC  ?WBC 4.0 - 10.5 K/uL 6.8   8.8   8.7    ?Hemoglobin 13.0 - 17.0 g/dL 15.2   15.1   15.8    ?Hematocrit 39.0 - 52.0 % 43.0   43.5   45.3    ?Platelets 150 - 400 K/uL 239   222   186    ?  ?12. Elevated LDL: continue Lipitor. ?13.  AKI: resolved although BUN mildly elevated enc po fluid  ? 5/1- Cr 1.08 and BUN doing well at 18= con't to monitor ? ?  Latest Ref Rng & Units 03/01/2022  ?  6:01 AM 02/22/2022  ?  6:09 AM 02/21/2022  ?  6:36 AM  ?  BMP  ?Glucose 70 - 99 mg/dL 112   142   152    ?BUN 6 - 20 mg/dL 18    25   23     ?Creatinine 0.61 - 1.24 mg/dL 1.08   1.06   1.01    ?Sodium 135 - 145 mmol/L 137   137   137    ?Potassium 3.5 - 5.1 mmol/L 3.7   4.1   4.1    ?Chloride 98 - 111 mmol/L 105   106   105    ?CO2 22 - 32 mmol/L 24   24   24     ?Calcium 8.9 - 10.3 mg/dL 8.8   8.5   8.7    ? ? ?14. Hypocalcemia:  Ionized Ca- 1.11 with normal albumin-4.1.  ?--Add calcium supplement bid.  ?15. Elevated Homocysteine levels:  B12, folate WNL--per Dr. Erlinda Hong , neuro, who felt no treatment needed as less than 50.  ?16. Obesity: BMI 35.53- provide dietary education. ? ?  ? ? ? ?LOS: ?13 days ?A FACE TO FACE EVALUATION WAS PERFORMED ? ?Luanna Salk Devynne Sturdivant ?03/03/2022, 8:48 AM  ? ? ? ?

## 2022-03-03 NOTE — Patient Care Conference (Signed)
Inpatient RehabilitationTeam Conference and Plan of Care Update ?Date: 03/03/2022   Time: 10:42 AM  ? ? ?Patient Name: Gordon Fisher      ?Medical Record Number: [id number]  ?Date of Birth: Jun 06, 1961 ?Sex: Male         ?Room/Bed: 4M12C/4M12C-01 ?Payor Info: Payor: Theme park manager / Plan: Theme park manager OTHER / Product Type: *No Product type* /   ? ?Admit Date/Time:  02/18/2022  3:25 PM ? 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 ?Hospital Problems: Principal Problem: ?  Acute ischemic left middle cerebral artery (MCA) stroke (HCC) ? ? ? ?Expected Discharge Date: Expected Discharge Date: 03/19/22 ? ?Team Members Present: ?Physician leading conference: Dr. Alysia Penna ?Social Worker Present: Erlene Quan, BSW ?Nurse Present: [name], RN ?PT Present: Page Spiro, PT ?OT Present: Willeen Cass, Ludwig Lean, COTA ?SLP Present: Sherren Kerns, SLP ?PPS Coordinator present : Gunnar Fusi, SLP ? ?   Current Status/Progress Goal Weekly Team Focus  ?Bowel/Bladder ? ?   Incontinent of bowel and bladder   continent   Toileting  ?Swallow/Nutrition/ Hydration ? ? min-to-sup A; dys 3 diet, thin liquids  sup A  Regular PO trials   ?ADL's ? ? MOD A for squat pivot transfers to shower and toilet, pt completed bathing with MODA, MODA for UB dressing and MOD A +2 for LB dressing via sit>stand. continues to be limited by apraxia (but improving with repetition during ADLs), aphasia, started to get tight in R hand ( resting hand splint at night- bonnie to see?), worked on estim getting some digit flexion/extension in R hand  MIN A- supervision  E stim, RUE NMR, transfer training, dynamic sitting/standing balance, DME training   ?Mobility ? ? 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 ? min-to-mod A comprehension, max A expression through multimodal means  min A comprehension of basic auditory information; mod-to-max A expression of needs through multimodal communication  verbalizations, reading comprehension, automatic speech, communication through multimodal means, accuracy with picture based comminication boards   ?Safety/Cognition/ Behavioral Observations ?           ?Pain ? ?   Knee pain   Knee pain managed   Voltaren gel prn knee pain  ?Skin ? ?   Rash/itching   Rash improved   Treatment for rash  ? ? ?Discharge Planning:  ?Discharging home with spouse and other family. Able to provide CGA to MOD A   ?Team Discussion: ?Patient with right side affected with sever aphasia, apraxia post Left MCA CVA. Note right inattention and communication issues with fatigue. ? ?Patient on target to meet rehab goals: ?yes, currently needs mod assist for squat pivot transfers. Able to bathe upper body with mod assist and mod assist + 2 for lower body. Goals for discharge set for min assist overall. ? ?*See Care Plan and progress notes for long and short-term goals.  ? ?Revisions to Treatment Plan:  ?E- stim ?Resting hand splint ?3 musketeer style training ?Communication boards ?Custom W/C ? ?Teaching Needs: ?Safety, transfers, toileting, medication  management, secondary risk management, etc  ?Current Barriers to Discharge: ?Decreased caregiver support and Home enviroment access/layout ? ?Possible Resolutions to Barriers: ?Family education  ?DME: Hospital bed, BSC, SS ?HH follow up services ?  ? ? 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 Barriers to Discharge: Incontinence;Other (comments) ? Barriers to Discharge Comments: Severe aphasia ?Possible Resolutions to [employer]: Working on toileting program given severe communication issues, work on oral hygiene ? ? ?Continued Need for Acute Rehabilitation Level of Care: The patient requires daily medical management by a physician with specialized training in physical medicine and rehabilitation for the following reasons: ?Direction of a multidisciplinary physical rehabilitation program to maximize functional independence : Yes ?Medical management of patient stability for increased activity during participation in an intensive rehabilitation regime.: Yes ?Analysis of laboratory values and/or radiology reports with any subsequent need for medication adjustment and/or medical intervention. : Yes ? ? ?I attest that I was present, lead the team conference, and concur with the assessment and plan of the team. ? ? ?[name] B ?03/03/2022, 4:42 PM  ? ? ? ? ? ? ?

## 2022-03-04 MED ORDER — BACLOFEN 5 MG HALF TABLET
5.0000 mg | ORAL_TABLET | Freq: Two times a day (BID) | ORAL | Status: DC
  Administered 2022-03-04 – 2022-03-05 (×2): 5 mg via ORAL
  Filled 2022-03-04 (×2): qty 1

## 2022-03-04 NOTE — Progress Notes (Signed)
Nutrition Follow-up ? ?DOCUMENTATION CODES:  ? ?Obesity unspecified ? ?INTERVENTION:  ? ?Continue 30 ml ProSource Plus BID, each supplement provides 100 kcals and 15 grams protein.  ?Continue Magic cup BID with meals, each supplement provides 290 kcal and 9 grams of protein ?Multivitamin w/ minerals daily ? ?NUTRITION DIAGNOSIS:  ? ?Inadequate oral intake related to dysphagia as evidenced by meal completion < 25%. - Progressing ? ?GOAL:  ? ?Patient will meet greater than or equal to 90% of their needs - Ongoing ? ?MONITOR:  ? ?PO intake, Supplement acceptance, Labs, Weight trends ? ?REASON FOR ASSESSMENT:  ? ?Consult ?Calorie Count, Assessment of nutrition requirement/status ? ?ASSESSMENT:  ? ?61 year old male presents with right sided weakness, right facial droop and slurred speech. CTA showed L-MCA MI occlusion and he underwent cerebral angio with thrombectomy. MRI brain done revealing small areas of acute infarct infarct in L-MCA territory with mild hemorrhagic transformation in left parietal lobe and L-MCA bifurcation residual vascular thrombus. Post op CT showed small SAH. Patient with resultant global aphasia. 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 Admitted to CIR. ? ?04/24 - Cortrak removed  ? ?Pt sitting in wheelchair in room. Pt nods his head to questions that RD asked.  ? ?Pt nodded yes that he is eating well. Nods no to liking Ensure shakes that are on bedside table. Nods no to getting Magic Cup on his trays.  ?Per EMR, pt has ate 100% of his last 8 meal completions. ? ?Medications reviewed and include: Calcium Carbonate, MVI, Protonix, Miralax ?Labs reviewed. ? ?Diet Order:   ?Diet Order   ? ?       ?  Diet heart healthy/carb modified Room service appropriate? Yes; Fluid consistency: Thin  Diet effective now       ?  ? ?  ?  ? ?  ? ?EDUCATION NEEDS:  ? ?Not appropriate for education at this time ? ?Skin:  Skin Assessment: Reviewed RN Assessment ? ?Last BM:  5/1 ? ?Height:  ? ?Ht Readings from  Last 1 Encounters:  ?02/23/22 5\' 8"  (1.727 m)  ? ? ?Weight:  ? ?Wt Readings from Last 1 Encounters:  ?02/23/22 106 kg  ? ?BMI:  Body mass index is 35.53 kg/m?. ? ?Estimated Nutritional Needs:  ? ?Kcal:  2200-2400 ? ?Protein:  110-125 grams ? ?Fluid:  >/= 2 L/day ? ? ?02/25/22 RD, LDN ?Clinical Dietitian ?See AMiON for contact information.  ? ?

## 2022-03-04 NOTE — Progress Notes (Signed)
Physical Therapy Session Note ? ?Patient Details  ?Name: Gordon Fisher ?MRN: [id number] ?Date of Birth: 1961/03/09 ? ?Today's Date: 03/04/2022 ?PT Individual Time: 6803-2122 ?PT Individual Time Calculation (min): 45 min   ?and  ?Today's Date: 03/04/2022 ?PT Missed Time: 30 Minutes ?Missed Time Reason: MD hold (Comment) (elevated HR awaiting EKG) ? ?Short Term Goals: ?Week 2:  PT Short Term Goal 1 (Week 2): Pt will perform supine<>sit with min assist ?PT Short Term Goal 2 (Week 2): Pt will perform sit<>stands using LRAD with mod assist  of 1 ?PT Short Term Goal 3 (Week 2): Pt will perform bed<>chair transfers using LRAD wtih mod assist of 1 ?PT Short Term Goal 4 (Week 2): Pt will ambulate at least 106ft using LRAD with max assist of 1 and +2 min assist ?PT Short Term Goal 5 (Week 2): Pt will participate in [place] Balance Assessment ? ?Skilled Therapeutic Interventions/Progress Updates:  ?  Pt received sitting in TIS w/c and looks slightly like he doesn't feel well but is agreeable to therapy session with yes head nod. Transported into hallway. Pt already wearing R LE [place] Walk-on Reaction GRAFO. Donned leg lifter to use during gait training. ? ?Sit>stand w/c> 3 Musketeer support with pt having increased moaning/groaning when leaning forward to initiate the transfer and comes to standing much slower and with heavier min assist.  Due to pt's aphasia difficult to determine his symptoms but when going to sit pt grabs towards his R shoulder in pain. Attempted to readjust arm placement by coming to stand using RW with R hand orthosis to provide R UE support with pt again coming to stand slowly and with heavier min assist and appears to be in pain or not feeling well but difficult to determine due to aphasia. Returned to sitting and assessed vitals. ? ?Assessed vitals in sitting: BP 110/89 (MAP 95), HR 124-132bpm, SpO2 96% at rest. Therapist notified nurse and then PA of pt's vitals - PA requested pt be assisted back to bed  and ordered an EKG. ? ?Transported pt back to room. Of note: pt with significant pain (grimacing) when therapist lifting R LE to place on w/c leg rest. R squat pivot TIS w/c>EOB with mod assist of 1 and +2 present for pt safety - pt demos improved motor planning of this task after block practice transfers yesterday. Sitting EOB with close supervision, doffed tennis shoes and AFO. Sit>supine with mod assist for R hemibody management onto bed.  ? ?Pt reached his L hand into his brief - pt found to be incontinent of bladder. Therapist positioned R LE into hooklying position and pt slightly lifts hips in bridge during dependent assist LB clothing management. Dependent assist peri-care - noticed pt very sensitive to touch during this and noticed R UE started having muscle spasms in biceps with visible contraction and UE shaking.  Initiated rolling towards R to place clean brief when pt had sudden onset of intense pain in R ribs (pt grimacing and moaning out in pain while grabbing his ribs with his L UE); however, after initial pain settled and therapist palpating that region pt making a face of discomfort but nodding head "no" when asking if the palpation hurt.  ? ?Transitioned to rolling L with max assist and then rolling back towards R with no onset of pain at this time. Pt still having R UE bicep muscle spasms and inability to extend his arm because of this. Pt left supine in bed with soft call button in  reach, bed alarm on, and nurse aware of pt's position. Missed 30 minutes of skilled physical therapy. ? ?Therapy Documentation ?Precautions:  ?Precautions ?Precautions: Fall, Other (comment) ?Precaution Comments: R hemi, global aphasia, cortrak ?Restrictions ?Weight Bearing Restrictions: No ? ? ?Pain: ? Pt noticed to have significant pain in R hemibody today - nurse notified at beginning of session for medication administration - noticed pt having R bicep muscle spasms, details above. ? ? ? ?Therapy/Group: Individual  Therapy ? ?Ginny Forth , PT, DPT, NCS, CSRS ?03/04/2022, 12:13 PM  ?

## 2022-03-04 NOTE — Progress Notes (Signed)
?                                                       PROGRESS NOTE ? ? ?Subjective/Complaints: ? ?No issues overnite , pt not  100% accurate with Y/N reposnse also discordant with head nods and thumbs up/down  ?ROS-  ?Limited by aphasia ?Objective: ?  ?No results found. ?No results for input(s): WBC, HGB, HCT, PLT in the last 72 hours. ? ?No results for input(s): NA, K, CL, CO2, GLUCOSE, BUN, CREATININE, CALCIUM in the last 72 hours. ? ? ?Intake/Output Summary (Last 24 hours) at 03/04/2022 0757 ?Last data filed at 03/03/2022 1800 ?Gross per 24 hour  ?Intake 480 ml  ?Output --  ?Net 480 ml  ? ?  ? ?  ? ?Physical Exam: ?Vital Signs ?Blood pressure 104/75, pulse 83, temperature 98.8 ?F (37.1 ?C), resp. rate 16, height 5\' 8"  (1.727 m), weight 106 kg, SpO2 96 %. ? ? ? ? 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 ?Neurologic: Cranial nerves II through XII intact, motor strength is 5/5 in left and 0/5 RIght deltoid, bicep, tricep, grip, hip flexor, knee extensors, ankle dorsiflexor and plantar flexor ?Trace Right hip add ?DIfficulty following commands requiring gestural cues for MMT  ?Able to follow simple commands but perseverates  ?Musculoskeletal: Full range of motion in all 4 extremities. No joint swelling ? ? ? ?Assessment/Plan: ?1. Functional deficits which require 3+ hours per day of interdisciplinary therapy in a comprehensive inpatient rehab setting. ?Physiatrist is providing close team supervision and 24 hour management of active medical problems listed below. ?Physiatrist and rehab team continue to assess barriers to discharge/monitor patient  progress toward functional and medical goals ? ?Care Tool: ? ?Bathing ?   ?Body parts bathed by patient: Right arm, Front perineal area, Right upper leg, Left upper leg, Face  ? Body parts bathed by helper: Left arm, Right lower leg, Left lower leg ?  ?  ?Bathing assist Assist Level: Maximal Assistance - Patient 24 - 49% ?  ?  ?Upper Body Dressing/Undressing ?Upper body dressing   ?What is the patient wearing?: Pull over shirt ?   ?Upper body assist Assist Level: Moderate Assistance - Patient 50 - 74% ?   ?Lower Body Dressing/Undressing ?Lower body dressing ? ? ?   ?What is the patient wearing?: Pants, Underwear/pull up ? ?  ? ?Lower body assist Assist for lower body dressing: 2 Helpers (MOD A +2) ?   ? ?Toileting ?Toileting Toileting Activity did not occur (AcupuncturistClothing management and hygiene only): N/A (no void or bm)  ?Toileting assist Assist for toileting: Dependent - Patient 0% ?  ?  ?Transfers ?Chair/bed transfer ? ?Transfers assist ?   ? ?Chair/bed transfer assist level: Moderate Assistance - Patient 50 - 74% (squat pivot) ?  ?  ?Locomotion ?Ambulation ? ? ?Ambulation assist ? ? Ambulation activity did not occur: Safety/medical concerns ? ?Assist level: 2 helpers ?Assistive device: Lite Gait ?Max distance: 13250ft  ? ?Walk 10 feet activity ? ? ?Assist ?  Walk 10 feet activity did not occur: Safety/medical concerns ? ?  ?   ? ?Walk 50 feet activity ? ? ?Assist Walk 50 feet with 2 turns activity did not occur: Safety/medical concerns ? ?  ?   ? ? ?Walk 150 feet activity ? ? ?Assist Walk 150 feet activity did not occur: Safety/medical concerns ? ?  ?  ?  ? ?Walk 10 feet on uneven surface  ?activity ? ? ?Assist Walk 10 feet on uneven surfaces activity did not occur: Safety/medical concerns ? ? ?  ?   ? ?Wheelchair ? ? ? ? ?Assist Is the patient using a wheelchair?: Yes (Per PT long-term goals) ?Type of Wheelchair: Manual ?Wheelchair activity did not occur: Safety/medical concerns ? ?  ?   ? ? ?Wheelchair 50 feet with 2  turns activity ? ? ? ?Assist ? ?  ?Wheelchair 50 feet with 2 turns activity did not occur: Safety/medical concerns ? ? ?   ? ?Wheelchair 150 feet activity  ? ? ? ?Assist ? Wheelchair 150 feet activity did not occur: Safety/medical concerns ? ? ?   ? ?Blood pressure 104/75, pulse 83, temperature 98.8 ?F (37.1 ?C), resp. rate 16, height 5\' 8"  (1.727 m), weight 106 kg, SpO2 96 %. ? ?Medical Problem List and Plan: ?1. 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            -patient may not shower ?            -ELOS/Goals: 5/19 min/mod A ?           Continue CIR- PT, OT and SLP ?2.  Antithrombotics: ?-DVT/anticoagulation:  Pharmaceutical: Lovenox added as 5 days out from [facility] ?            -antiplatelet therapy:  on ASA/Brillinta ?3. Pain Management: Tylenol prn.  ?4. Mood: LCSW to follow for evaluation and support when appropriate.  ?            -antipsychotic agents: N/A ?5. Neuropsych: This patient is not capable of making decisions on his own behalf. ?6. Skin/Wound Care: Routine pressure relief measures.  ?7. Fluids/Electrolytes/Nutrition: Will monitor I/O. CMET ok except low alb  ?8. Hyponatremia: Stable. Monitor with serial checks.  ?9. Prediabetes: Hgb A1C-6.0. fair control  ?-CBG (last 3)  ?Recent Labs  ?  03/02/22 ?1157 03/02/22 ?2100 03/03/22 ?05/03/22  ?GLUCAP 93 124* 121*  ? ?5/2CBGs controlled- off TF may d/c cbg ?10. Dysphagia: D3 thin liq  ?          eating 75-100% meals  ?11. Leucocytosis: resolved  ?         ? ?  Latest Ref Rng & Units 03/01/2022  ?  6:01 AM 02/22/2022  ?  6:09 AM 02/19/2022  ?  5:27 AM  ?CBC  ?WBC 4.0 - 10.5 K/uL 6.8   8.8   8.7    ?Hemoglobin 13.0 - 17.0 g/dL 02/21/2022   74.1   28.7    ?Hematocrit 39.0 - 52.0 % 43.0   43.5   45.3    ?Platelets 150 - 400 K/uL 239   222   186    ?  ?12. Elevated LDL: continue Lipitor. ?13.  AKI: resolved although BUN mildly elevated enc po fluid  ? 5/1- Cr 1.08 and BUN doing well at 18= con't to monitor ? ?  Latest Ref Rng & Units 03/01/2022  ?  6:01 AM 02/22/2022  ?   6:09 AM 02/21/2022  ?  6:36  AM  ?BMP  ?Glucose 70 - 99 mg/dL 329   924   268    ?BUN 6 - 20 mg/dL 18   25   23     ?Creatinine 0.61 - 1.24 mg/dL   3.41   9.62    ?Sodium 135 - 145 mmol/L 137   137   137    ?Potassium 3.5 - 5.1 mmol/L 3.7   4.1   4.1    ?Chloride 98 - 111 mmol/L 105   106   105    ?CO2 22 - 32 mmol/L 24   24   24     ?Calcium 8.9 - 10.3 mg/dL 8.8   8.5   8.7    ? ? ?14. Hypocalcemia:  Ionized Ca- 1.11 with normal albumin-4.1.  ?--Add calcium supplement bid.  ?15. Elevated Homocysteine levels:  B12, folate WNL--per Dr. 2.29 , neuro, who felt no treatment needed as less than 50.  ?16. Obesity: BMI 35.53- provide dietary education. ? ?  ? ?LOS: ?14 days ?A FACE TO FACE EVALUATION WAS PERFORMED ? ? Gordon Fisher ?03/04/2022, 7:57 AM  ? ? ? ?

## 2022-03-04 NOTE — Progress Notes (Signed)
Speech Language Pathology Daily Session Note ? ?Patient Details  ?Name: Gordon Fisher ?MRN: [id number] ?Date of Birth: [birth year] ? ?Today's Date: 03/04/2022 ?SLP Individual Time: 0800-0900 ?SLP Individual Time Calculation (min): 60 min ? ?Short Term Goals: ?Week 2: SLP Short Term Goal 1 (Week 2): Patient will consume current diet with minimal s/s of aspiration with with min A cues to implement safe swallowing strategies/precautions (slow rate, small bites/sips) ?SLP Short Term Goal 2 (Week 2): Patient will respond to yes/no questions via multimodal means with mod A multimodal cues to achieve 80% accuracy ?SLP Short Term Goal 3 (Week 2): Patient will ID field of 3 written words by attribute/function/features with mod A multimodal cues to achieve 75% accuracy ?SLP Short Term Goal 4 (Week 2): Pt will ID field of 3 objects attribute/function/features with mod A multimodal cues to achieve 75% accuracy ?SLP Short Term Goal 5 (Week 2): Patient complete object-to-word matching with mod A multimodal cues to achieve 75% accuracy ?SLP Short Term Goal 6 (Week 2): Pt will approximate/imitate at the sound level with max A to achieve 50% accuracy with max A multimodal cues. ? ?Skilled Therapeutic Interventions: Skilled ST treatment focused on swallowing and language goals. SLP facilitated skilled assessment of Dys 3 diet tolerance with morning meal, in addition to regular PO trials. Pt consumed both dys 3 and regular textures with effective mastication, mild anterior spillage, no oral residuals post swallow, and no overt s/sx of aspiration. Pt performed self feeding following set-up A with mod I for consistently monitoring and clearing occasional right anterior spillage. Pt consumed meal in an appropriate rate and with average bite sizes. No impulsivity observed. Recommend patient advance to regular textures with thin liquids and with intermittent supervision during meals. Notified Nurse and Nurse Tech. Meds are currently being  crushed; requested for nurse to trial meds whole in applesauce (given one at a time) during next med pass. Continue crushed meds if pt demonstrates poor tolerance as evidenced by inability to orally clear, attempts to masticate pill, coughing/throat clearing, etc. Nurse verbalized understanding and agreement to trial later today. Pt performed oral care following set-up A; able to orally expel secretions in oral care basin. Updated staff communication to perform oral care at sink vs. with suction. Food & Nutrition services staff came to room to collect pt's lunch and dinner menu. SLP facilitated this communication exchange for communication of food preferences via multimodal means (i.e., yes/no responses via head nods, gestures, pointing to field of written choices on dry erase board). Pt made selections by pointing to up to field of 5 written choices (main dish, sides, drinks, condiments, etc.) with sup A verbal/visual cues; min A cues were required to clarify verbal/gestural responses, as pt exhibited occasional inconsistencies with head nods & verbal responses to yes/no questions. Overall this was a very positive encounter and pt was able to effectively communicate his wishes/preferences with multimodal communication with sup-to-min A support by SLP. ?Patient was left in bed with alarm activated and immediate needs within reach at end of session. Continue per current plan of care.   ?   ? ?Pain ?Pain Assessment ?Pain Scale: 0-10 ?Pain Score: 0-No pain ? ?Therapy/Group: Individual Therapy ? ?Gordon Fisher T Gordon Fisher ?03/04/2022, 9:01 AM ?

## 2022-03-04 NOTE — Progress Notes (Signed)
Occupational Therapy Session Note ? ?Patient Details  ?Name: Gordon Fisher ?MRN: [id number] ?Date of Birth: Nov 06, 1960 ? ?Today's Date: 03/04/2022 ?OT Individual Time: 3875-6433 ?OT Individual Time Calculation (min): 74 min  ? ? ?Short Term Goals: ?Week 2:  OT Short Term Goal 1 (Week 2): pt will complete 1/3 toileting tasks with MOD A ?OT Short Term Goal 2 (Week 2): pt will sit>stand from EOB with MIN A +2 during LB dressing ?OT Short Term Goal 3 (Week 2): pt will initiate hemi technique for UB dressing with < 3 verbal cues ?OT Short Term Goal 4 (Week 2): pt will initiate grooming tasks at sink with MIN A ? ?Skilled Therapeutic Interventions/Progress Updates:  ?  ADL:  Patient received supine in bed agreeable to therapy.  Patient with soiled brief, assisted to roll left/right with assistance.  Patient assisted to seated position with mod assist, and to wheelchair with mod assist - squat pivot.  Patient declined shower - washed up at sink.  Patient clearly with some carryover of bathing techniques from previous sessions.  Apraxia still present and evident with dressing activity.  Patient put head and left arm into shirt, then unable to get right arm into shirt.  Patient recognized error and able to start task over.  Assisted patient to don right arm, then patient able to complete.  Sit to stand at sink with mod assist for RLE alignment.  Patient much more comfortable with forward weight shift.   ?Neuromuscular reed:  Took patient to gym - worked on midline awareness in sit to stand.  Worked on weight shift in standing toward RLE with assistance to align hip and knee.  Worked on weight shift to right in sitting, and movement of RUE on moveable surface.   ?Patient needed min assist to transfer level surface toward right side back to wheelchair.  Patient returned to room in wheelchair with touchpad call bell and necessary items in reach, and chair alarm engaged.   ? ? ?Therapy Documentation ?Precautions:   ?Precautions ?Precautions: Fall, Other (comment) ?Precaution Comments: R hemi, global aphasia, cortrak ?Restrictions ?Weight Bearing Restrictions: No ?  ?Pain: ? 0/10 ? ? ? ? ?Therapy/Group: Individual Therapy ? ?[name] ?03/04/2022, 12:26 PM ?

## 2022-03-05 ENCOUNTER — Inpatient Hospital Stay (HOSPITAL_COMMUNITY): Payer: 59

## 2022-03-05 DIAGNOSIS — I63512 Cerebral infarction due to unspecified occlusion or stenosis of left middle cerebral artery: Secondary | ICD-10-CM

## 2022-03-05 LAB — CBC WITH DIFFERENTIAL/PLATELET
Abs Immature Granulocytes: 0.06 10*3/uL (ref 0.00–0.07)
Basophils Absolute: 0.1 10*3/uL (ref 0.0–0.1)
Basophils Relative: 0 %
Eosinophils Absolute: 0 10*3/uL (ref 0.0–0.5)
Eosinophils Relative: 0 %
HCT: 44.4 % (ref 39.0–52.0)
Hemoglobin: 15.6 g/dL (ref 13.0–17.0)
Immature Granulocytes: 0 %
Lymphocytes Relative: 12 %
Lymphs Abs: 1.8 10*3/uL (ref 0.7–4.0)
MCH: 32 pg (ref 26.0–34.0)
MCHC: 35.1 g/dL (ref 30.0–36.0)
MCV: 91.2 fL (ref 80.0–100.0)
Monocytes Absolute: 1.7 10*3/uL — ABNORMAL HIGH (ref 0.1–1.0)
Monocytes Relative: 11 %
Neutro Abs: 11.8 10*3/uL — ABNORMAL HIGH (ref 1.7–7.7)
Neutrophils Relative %: 77 %
Platelets: 205 10*3/uL (ref 150–400)
RBC: 4.87 MIL/uL (ref 4.22–5.81)
RDW: 12.5 % (ref 11.5–15.5)
WBC: 15.4 10*3/uL — ABNORMAL HIGH (ref 4.0–10.5)
nRBC: 0 % (ref 0.0–0.2)

## 2022-03-05 LAB — LACTIC ACID, PLASMA: Lactic Acid, Venous: 1.1 mmol/L (ref 0.5–1.9)

## 2022-03-05 LAB — URINALYSIS, ROUTINE W REFLEX MICROSCOPIC
Bilirubin Urine: NEGATIVE
Glucose, UA: NEGATIVE mg/dL
Ketones, ur: NEGATIVE mg/dL
Nitrite: POSITIVE — AB
Protein, ur: NEGATIVE mg/dL
Specific Gravity, Urine: 1.025 (ref 1.005–1.030)
pH: 5 (ref 5.0–8.0)

## 2022-03-05 LAB — GLUCOSE, CAPILLARY: Glucose-Capillary: 148 mg/dL — ABNORMAL HIGH (ref 70–99)

## 2022-03-05 LAB — PROCALCITONIN: Procalcitonin: 1.03 ng/mL

## 2022-03-05 IMAGING — CR DG FOOT COMPLETE 3+V*R*
3 series · 3 of 3 positions shown · non-contrast
Comparison: None Available.

CLINICAL DATA: Pain, fever

EXAM:
RIGHT FOOT COMPLETE - 3+ VIEW

[foot ap]
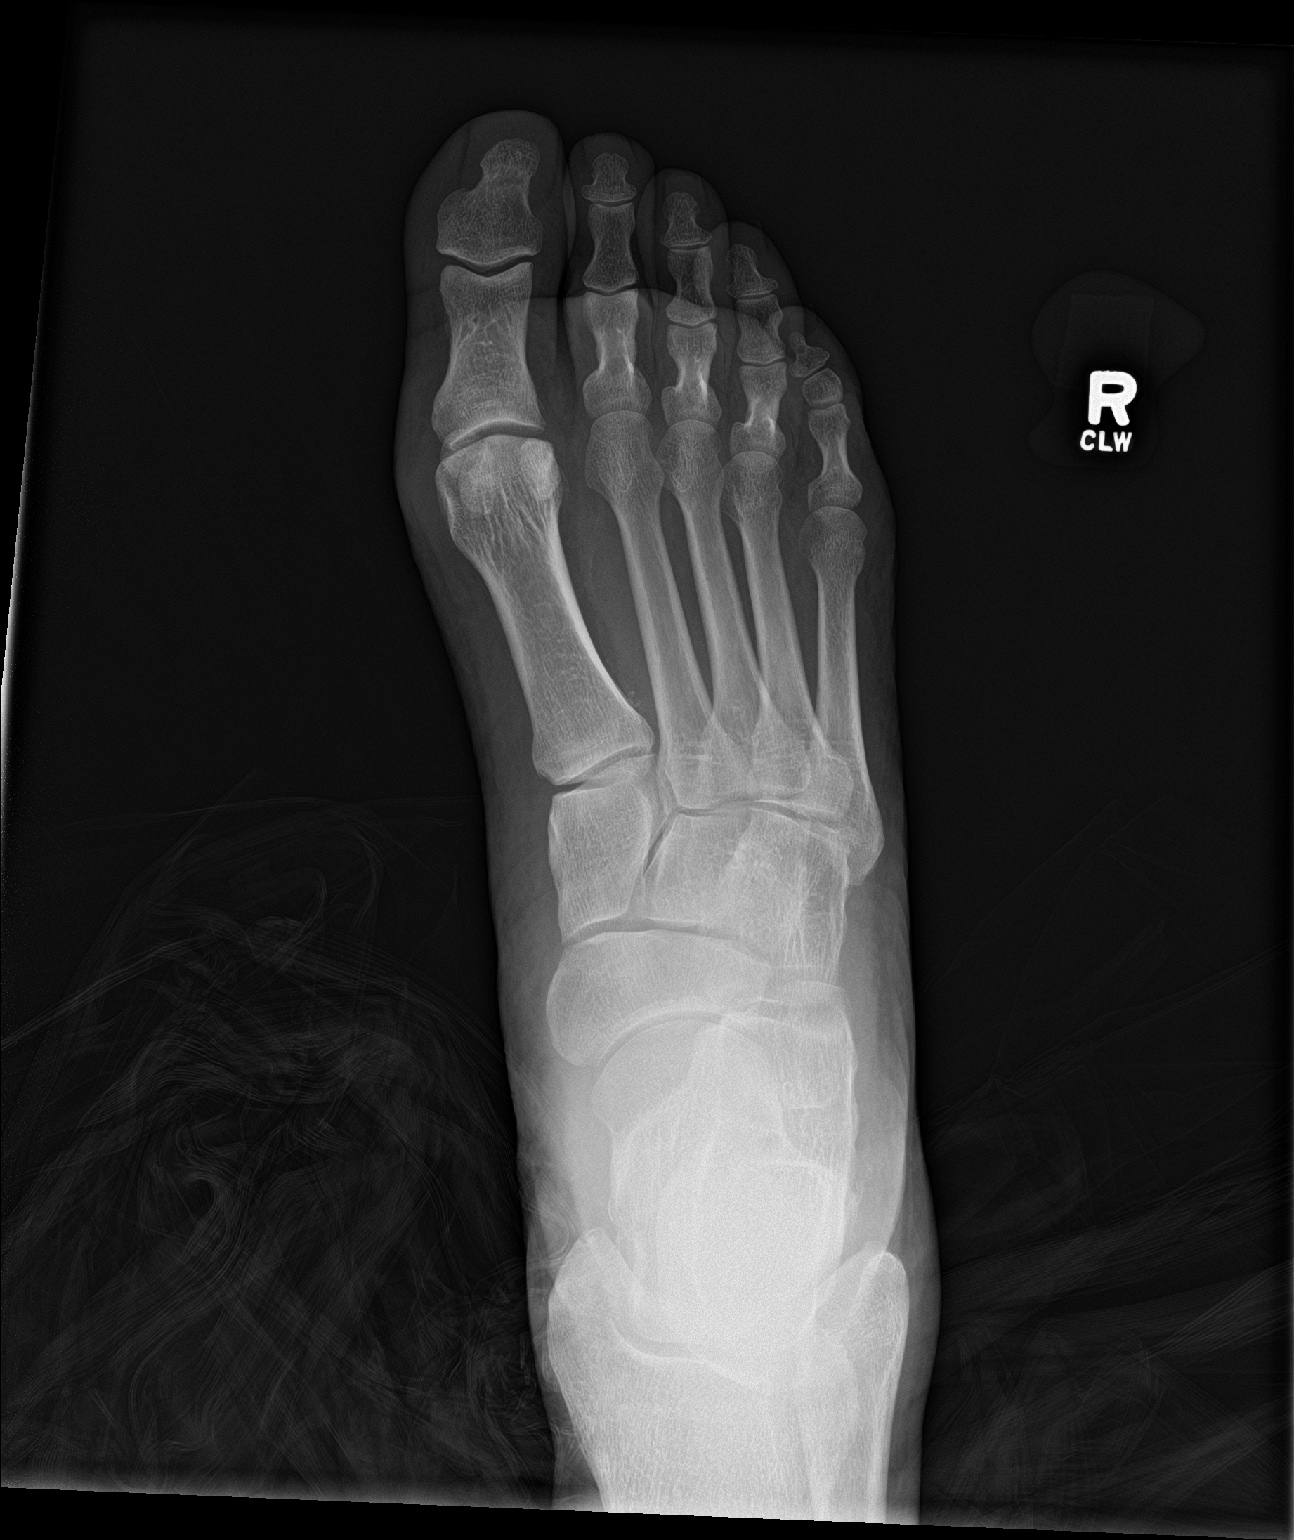

[foot obl]
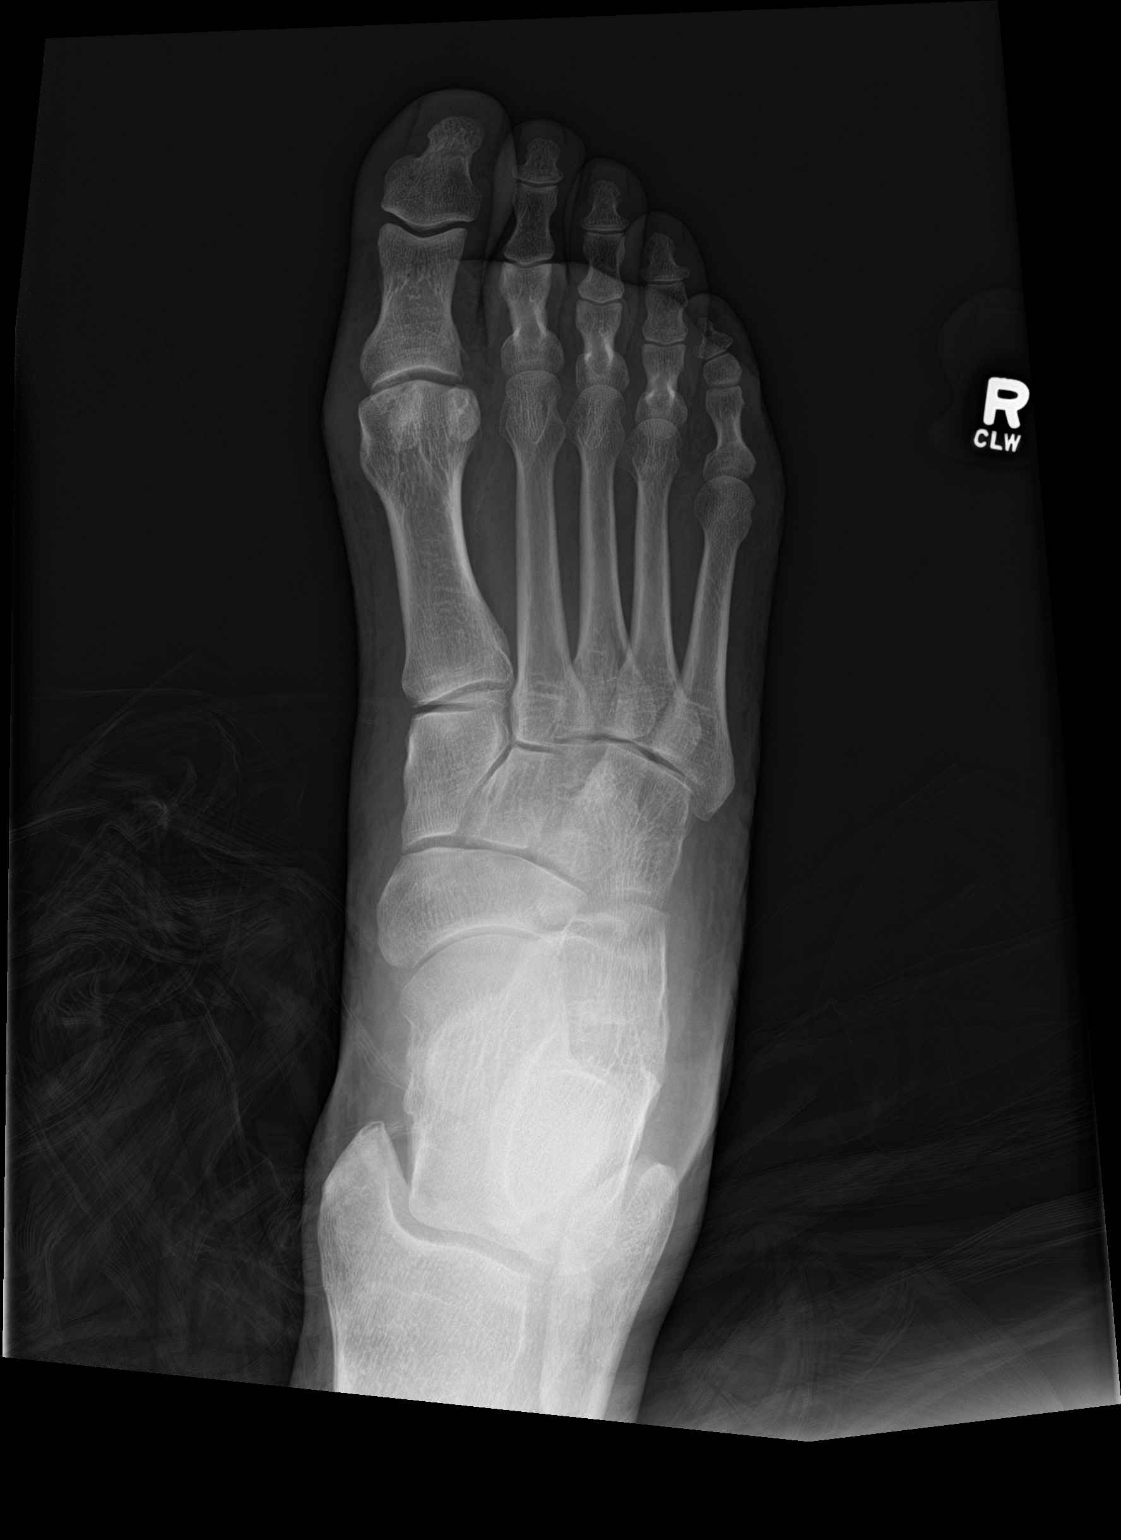

[foot lat]
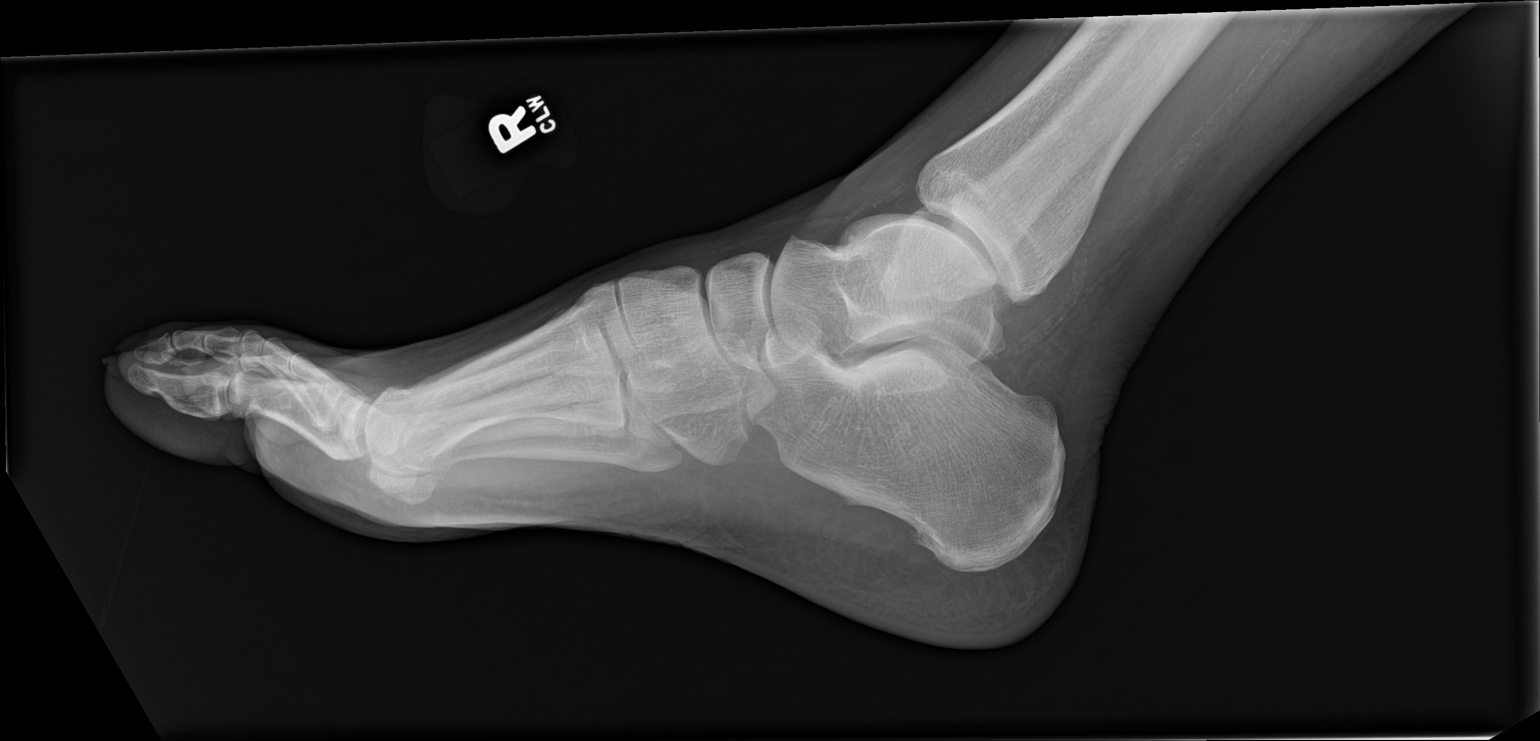

[3 of 3 positions shown; findings below may reference images not displayed]

FINDINGS: There is no evidence of fracture or dislocation. No erosion. No
periosteal elevation. There is no evidence of arthropathy or other
focal bone abnormality. Atherosclerotic vascular calcifications are
present. Soft tissues are unremarkable.
IMPRESSION: Negative.

## 2022-03-05 IMAGING — CR DG CHEST 2V
2 series · 2 of 2 positions shown · non-contrast
Comparison: [DATE]

CLINICAL DATA: Chest pain and fever

EXAM:
CHEST - 2 VIEW

[chest lat]
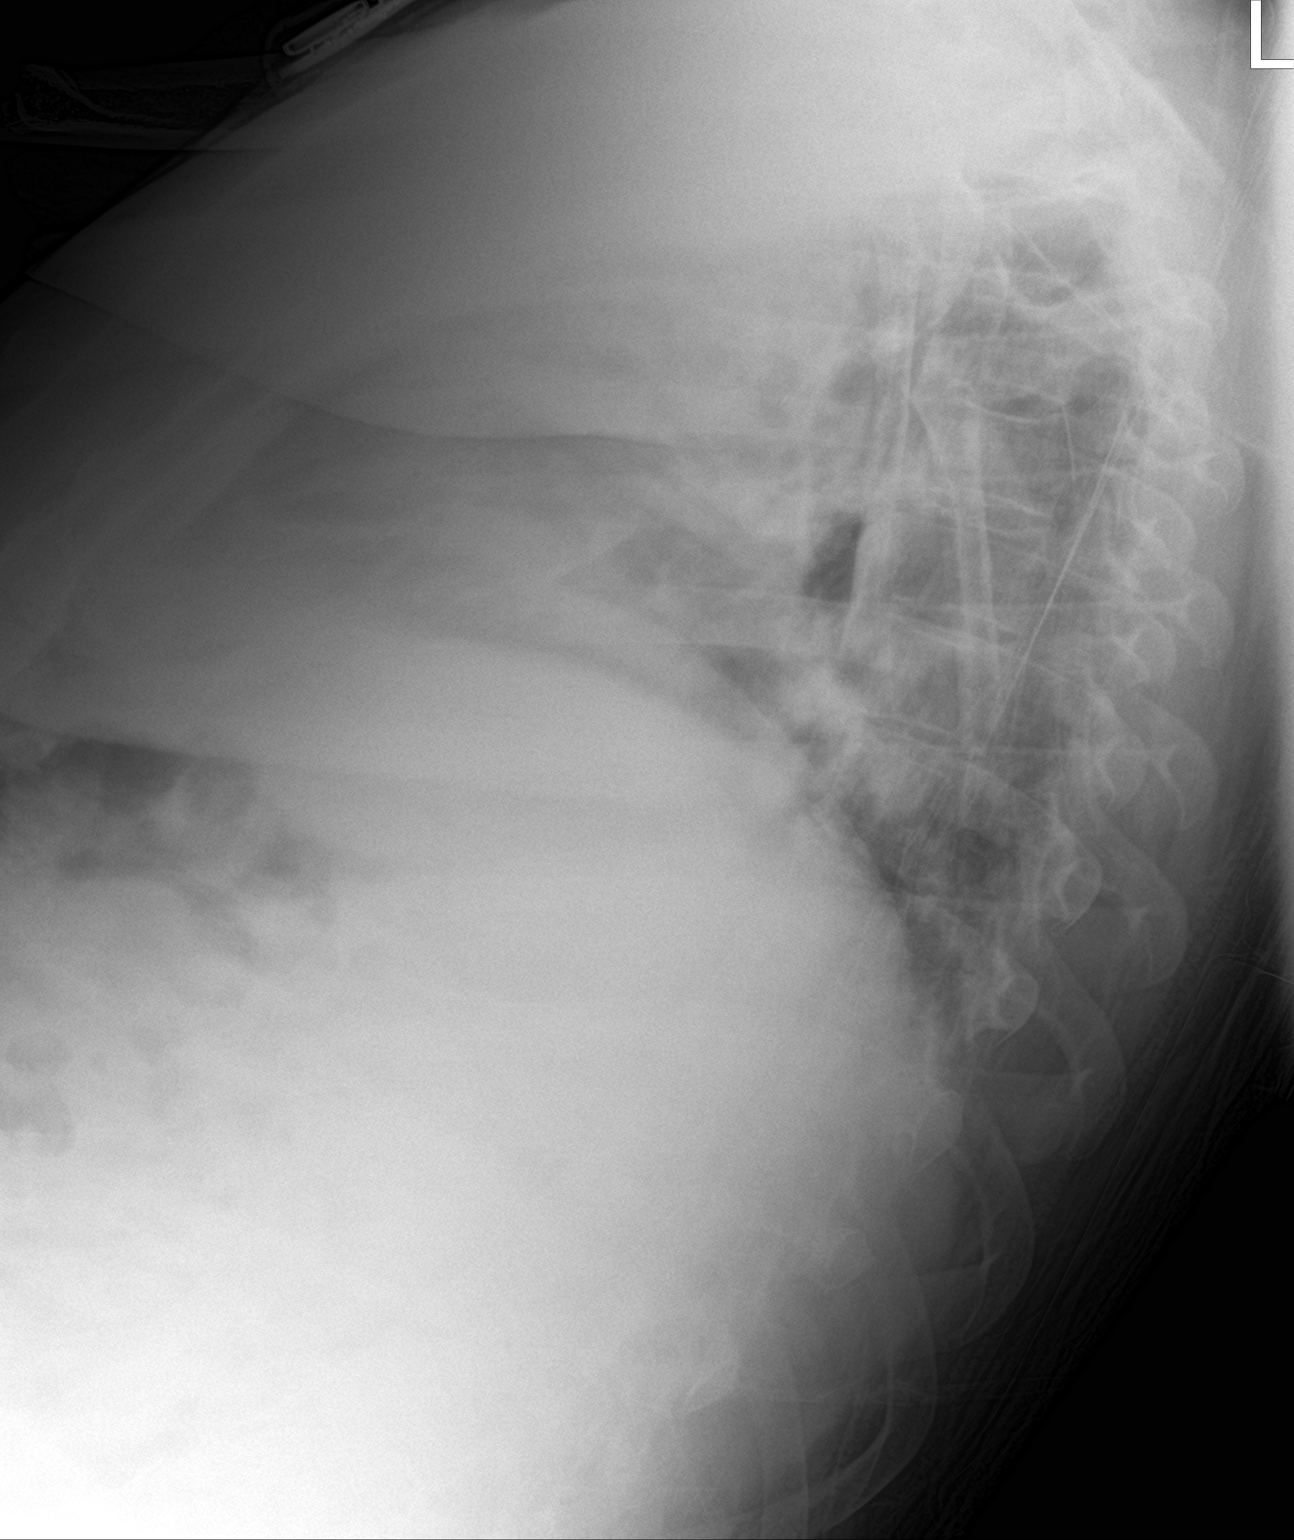

[chest ap]
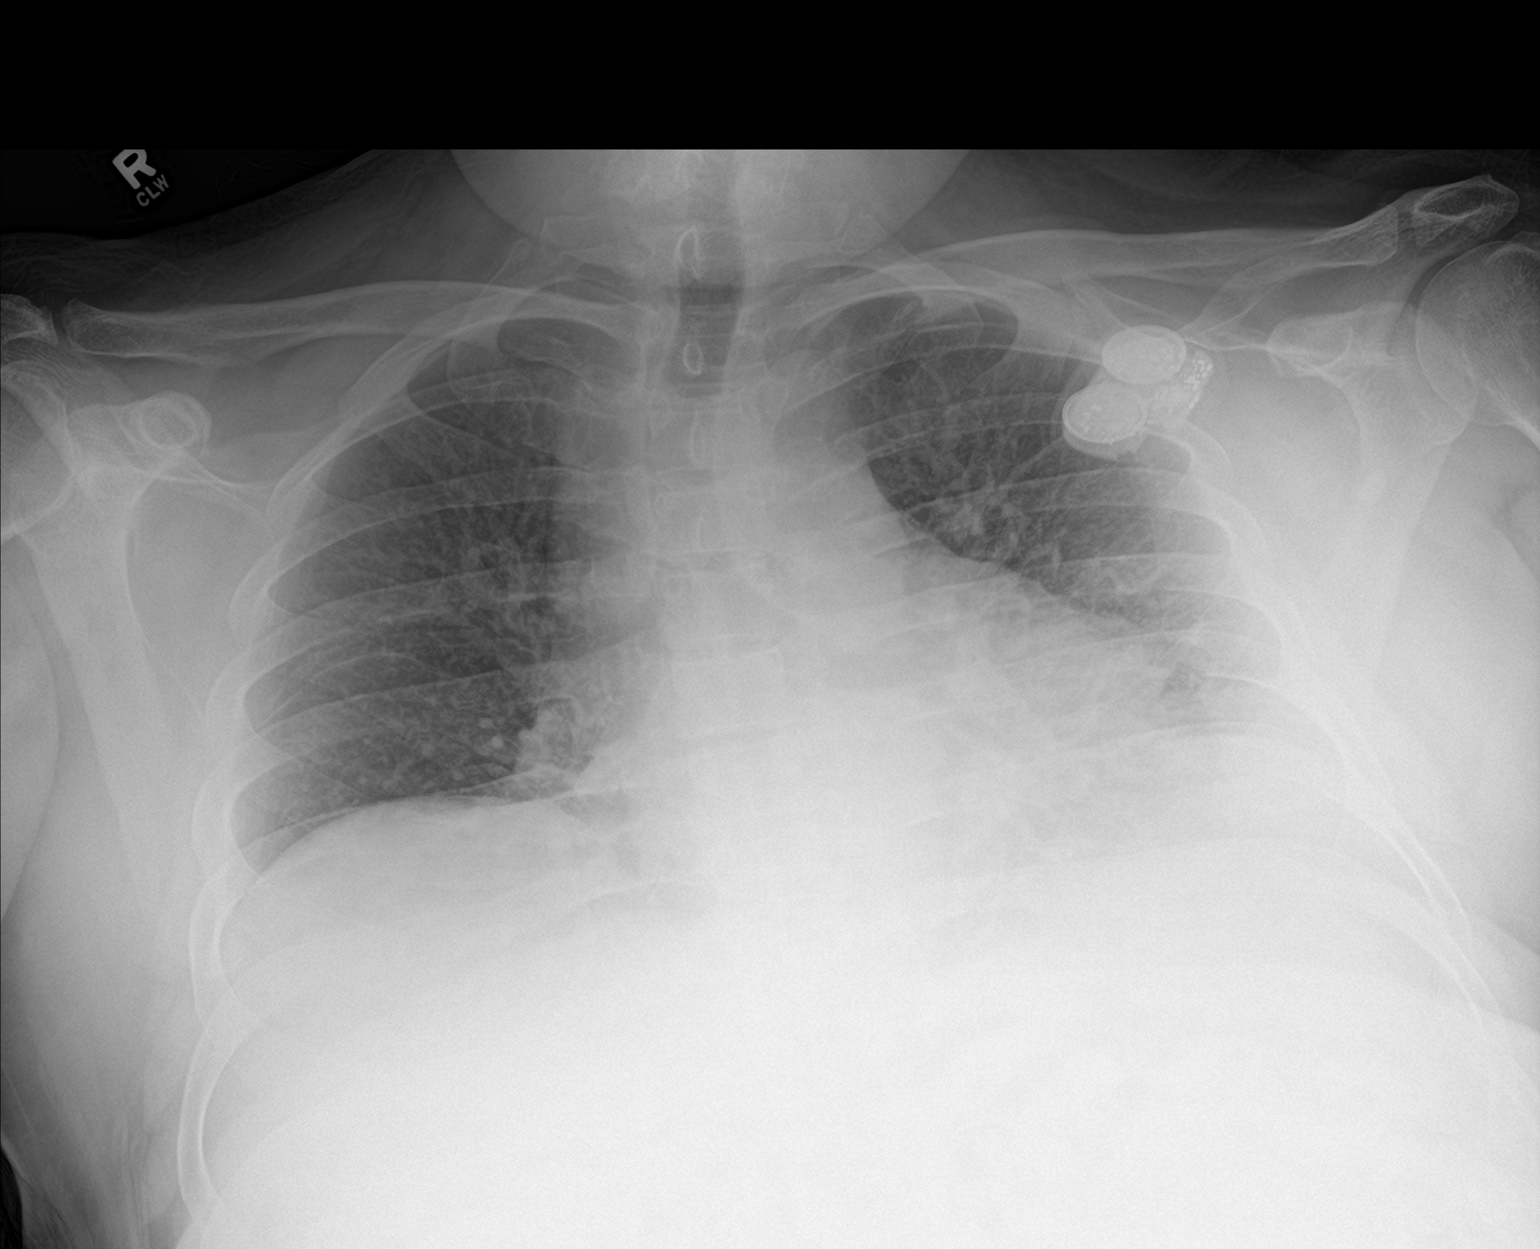

[2 of 2 positions shown; findings below may reference images not displayed]

FINDINGS: Low lung volumes with bibasilar atelectasis, worse on left.
Difficult to exclude basilar pneumonia. No large effusion or
pneumothorax. Trachea midline. prominent heart size suspect related
to low lung volumes. Monitor device overlies the left upper chest.
Lateral view is limited because positioning.
IMPRESSION: Low volume exam with bibasilar atelectasis.  Limited exam.

## 2022-03-05 IMAGING — CR DG KNEE COMPLETE 4+V*R*
4 series · 4 of 4 positions shown · non-contrast
Comparison: None Available.

CLINICAL DATA: Pain, fever

EXAM:
RIGHT KNEE - COMPLETE 4+ VIEW

[knee obl (1 of 2)]
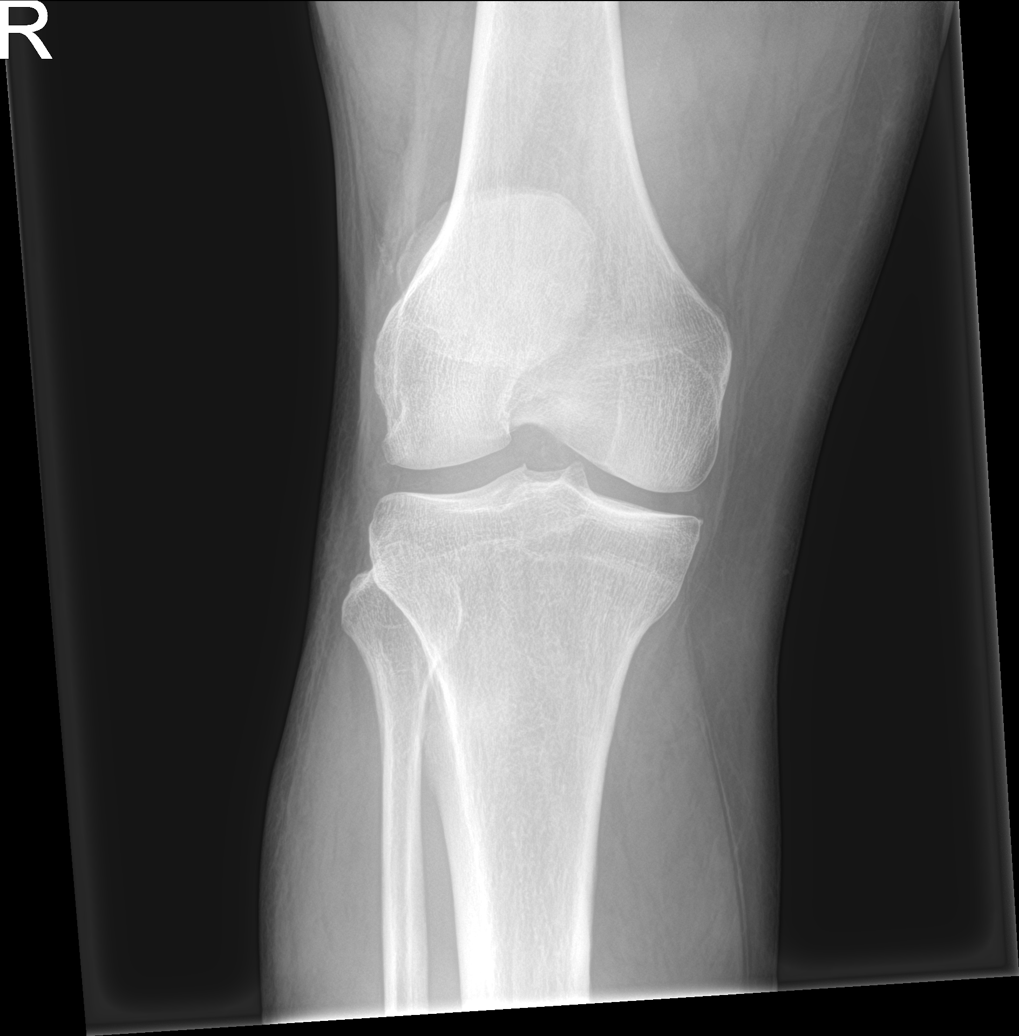

[knee lat]
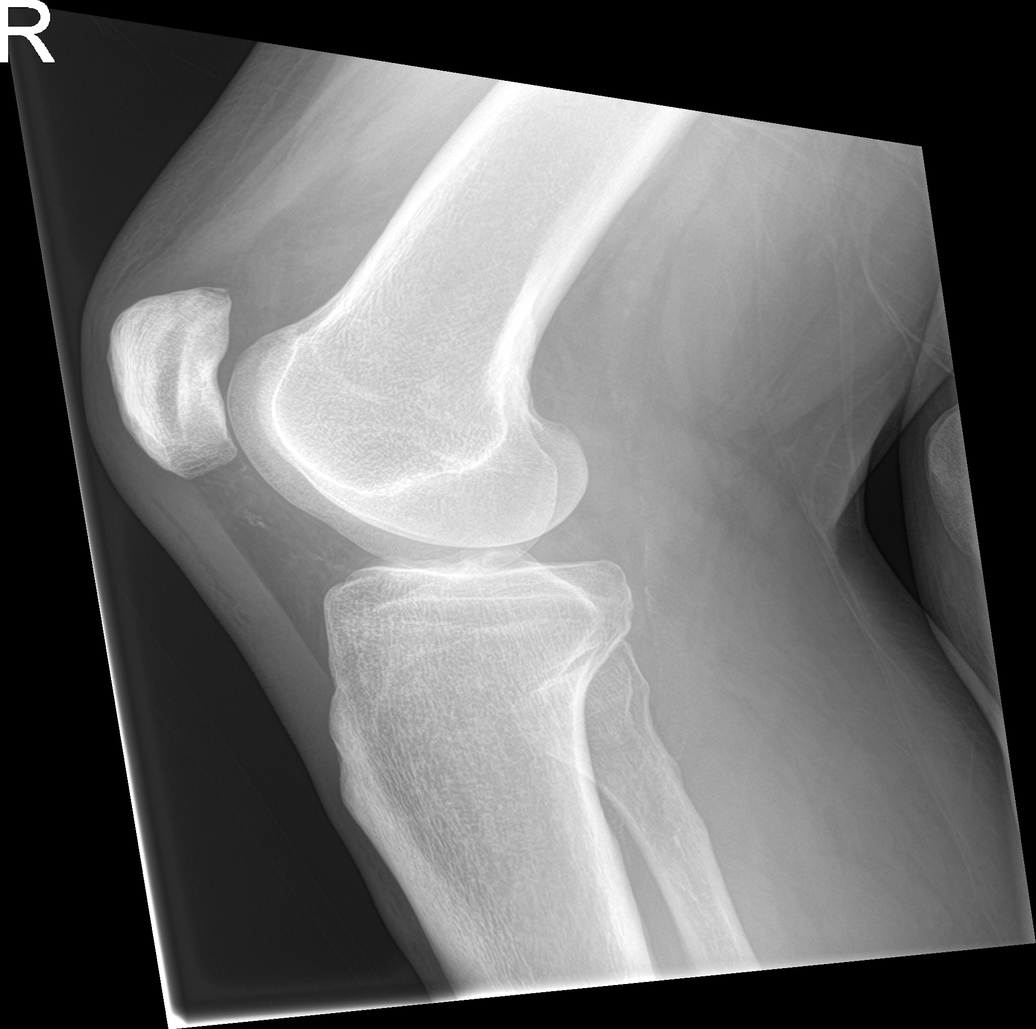

[knee ap]
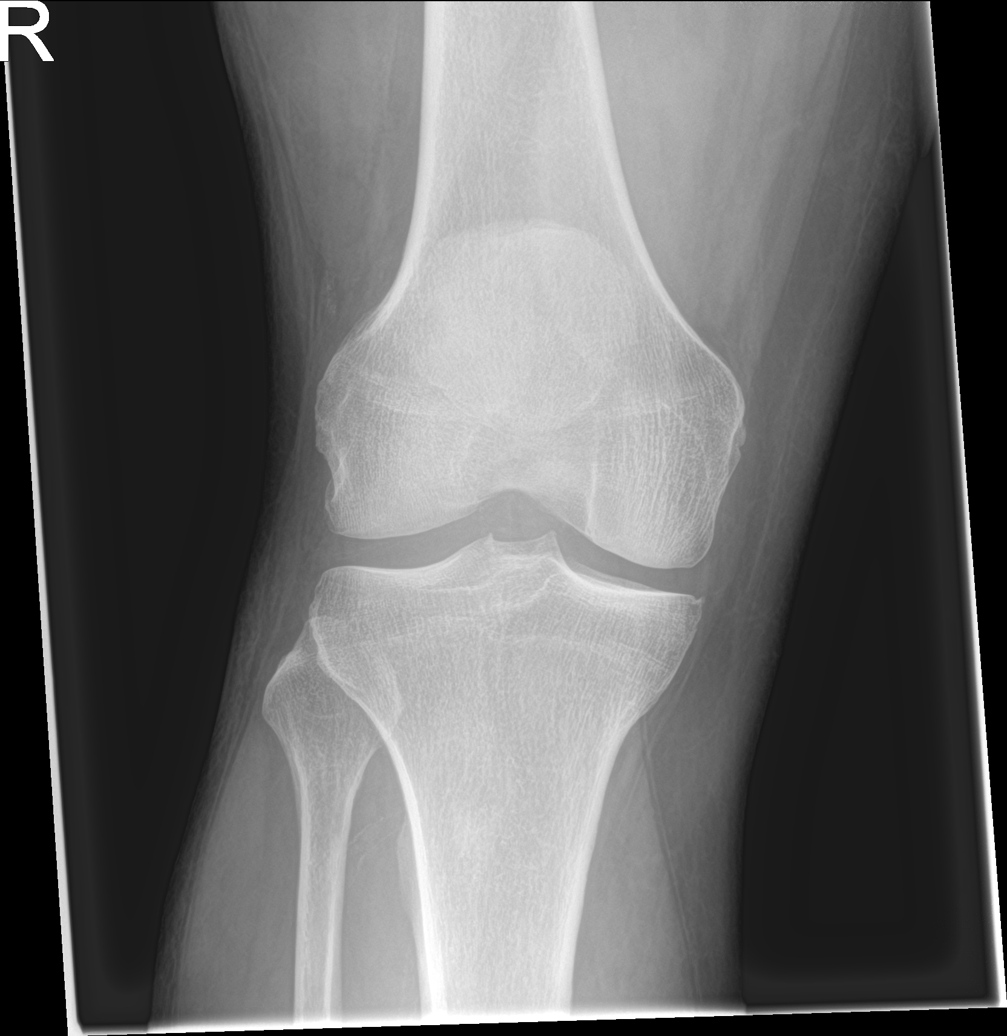

[knee obl (2 of 2)]
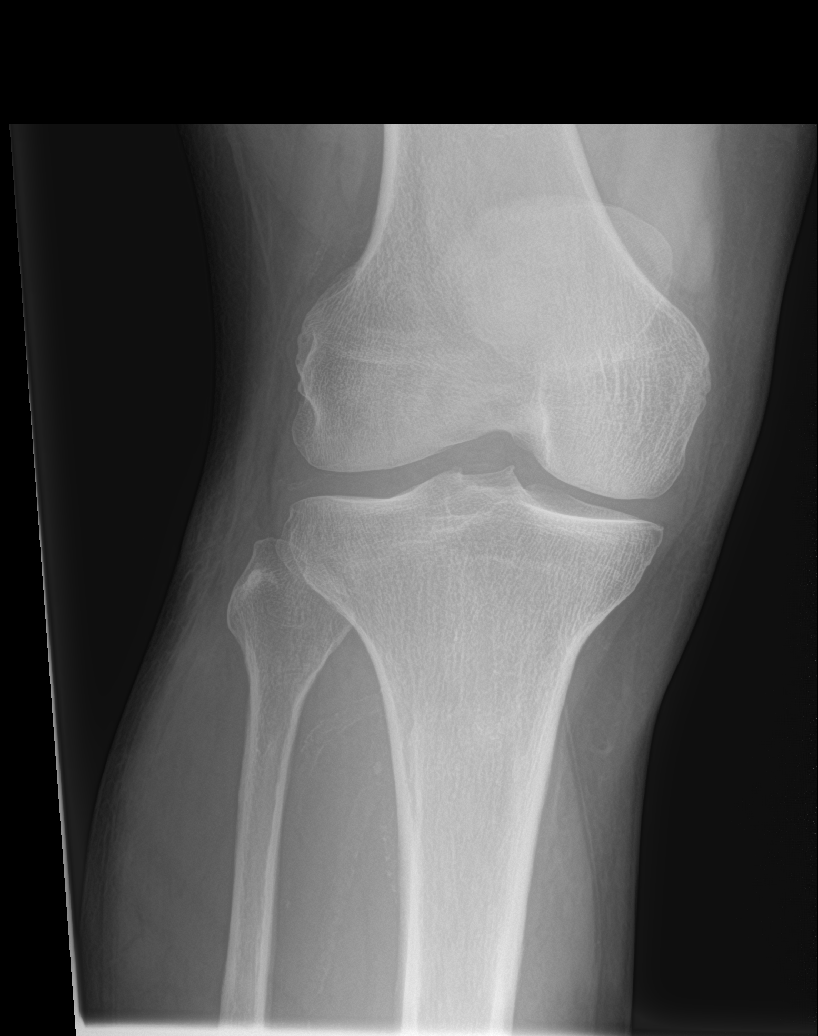

[4 of 4 positions shown; findings below may reference images not displayed]

FINDINGS: No evidence of fracture, dislocation, or joint effusion. No evidence
of arthropathy or other focal bone abnormality. Mild prepatellar
soft tissue swelling. Atherosclerotic vascular calcifications.
IMPRESSION: 1. No acute osseous abnormality, right knee.
2. Mild prepatellar soft tissue swelling.

## 2022-03-05 MED ORDER — SODIUM CHLORIDE 0.9 % IV SOLN
2.0000 g | INTRAVENOUS | Status: DC
  Administered 2022-03-05 – 2022-03-07 (×3): 2 g via INTRAVENOUS
  Filled 2022-03-05 (×3): qty 20

## 2022-03-05 MED ORDER — ENOXAPARIN SODIUM 30 MG/0.3ML IJ SOSY
30.0000 mg | PREFILLED_SYRINGE | Freq: Two times a day (BID) | INTRAMUSCULAR | Status: DC
  Administered 2022-03-05 – 2022-03-11 (×12): 30 mg via SUBCUTANEOUS
  Filled 2022-03-05 (×12): qty 0.3

## 2022-03-05 NOTE — Progress Notes (Signed)
?                                                       PROGRESS NOTE ? ? ?Subjective/Complaints: ? ?Smiling working with SLP, still severely aphasic and apraxic ?ROS-  ?Limited by aphasia ?Objective: ?  ?No results found. ?No results for input(s): WBC, HGB, HCT, PLT in the last 72 hours. ? ?No results for input(s): NA, K, CL, CO2, GLUCOSE, BUN, CREATININE, CALCIUM in the last 72 hours. ? ? ?Intake/Output Summary (Last 24 hours) at 03/05/2022 0853 ?Last data filed at 03/05/2022 0816 ?Gross per 24 hour  ?Intake 420 ml  ?Output --  ?Net 420 ml  ? ?  ? ?  ? ?Physical Exam: ?Vital Signs ?Blood pressure 115/79, pulse 96, temperature 98.7 ?F (37.1 ?C), resp. rate 17, height 5\' 8"  (1.727 m), weight 106 kg, SpO2 95 %. ? ? ? ? 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 ?Neurologic: Cranial nerves II through XII intact, motor strength is 5/5 in left and 0/5 RIght deltoid, bicep, tricep, grip, hip flexor, knee extensors, ankle dorsiflexor and plantar flexor ?Trace Right hip add ?DIfficulty following commands requiring gestural cues for MMT  ?Able to follow simple commands but perseverates  ?Musculoskeletal: Full range of motion in all 4 extremities. No joint swelling ? ? ? ?Assessment/Plan: ?1. Functional deficits which require 3+ hours per day of interdisciplinary therapy in a comprehensive inpatient rehab setting. ?Physiatrist is providing close team supervision and 24 hour management of active medical problems listed below. ?Physiatrist and rehab team continue to assess barriers to discharge/monitor patient progress toward functional and medical goals ? ?Care  Tool: ? ?Bathing ?   ?Body parts bathed by patient: Right arm, Front perineal area, Left upper leg, Right upper leg, Chest, Abdomen, Face  ? Body parts bathed by helper: Left arm, Right lower leg, Left lower leg ?  ?  ?Bathing assist Assist Level: Moderate Assistance - Patient 50 - 74% ?  ?  ?Upper Body Dressing/Undressing ?Upper body dressing   ?What is the patient wearing?: Pull over shirt ?   ?Upper body assist Assist Level: Moderate Assistance - Patient 50 - 74% ?   ?Lower Body Dressing/Undressing ?Lower body dressing ? ? ?   ?What is the patient wearing?: Incontinence brief, Pants ? ?  ? ?Lower body assist Assist for lower body dressing: Maximal Assistance - Patient 25 - 49% ?   ? ?Toileting ?Toileting Toileting Activity did not occur (Probation officer and hygiene only): N/A (no void or bm)  ?Toileting assist Assist for toileting: Dependent - Patient 0% ?  ?  ?Transfers ?Chair/bed transfer ? ?Transfers assist ?   ? ?Chair/bed transfer assist level: Moderate Assistance - Patient 50 - 74% (squat pivot) ?  ?  ?Locomotion ?Ambulation ? ? ?Ambulation assist ? ? Ambulation activity did not occur: Safety/medical concerns ? ?Assist level: 2 helpers ?Assistive device: Lite Gait ?Max distance: 174ft  ? ?Walk 10 feet activity ? ? ?Assist ? Walk 10 feet activity did not occur: Safety/medical  concerns ? ?  ?   ? ?Walk 50 feet activity ? ? ?Assist Walk 50 feet with 2 turns activity did not occur: Safety/medical concerns ? ?  ?   ? ? ?Walk 150 feet activity ? ? ?Assist Walk 150 feet activity did not occur: Safety/medical concerns ? ?  ?  ?  ? ?Walk 10 feet on uneven surface  ?activity ? ? ?Assist Walk 10 feet on uneven surfaces activity did not occur: Safety/medical concerns ? ? ?  ?   ? ?Wheelchair ? ? ? ? ?Assist Is the patient using a wheelchair?: Yes (Per PT long-term goals) ?Type of Wheelchair: Manual ?Wheelchair activity did not occur: Safety/medical concerns ? ?  ?   ? ? ?Wheelchair 50 feet with 2 turns  activity ? ? ? ?Assist ? ?  ?Wheelchair 50 feet with 2 turns activity did not occur: Safety/medical concerns ? ? ?   ? ?Wheelchair 150 feet activity  ? ? ? ?Assist ? Wheelchair 150 feet activity did not occur: Safety/medical concerns ? ? ?   ? ?Blood pressure 115/79, pulse 96, temperature 98.7 ?F (37.1 ?C), resp. rate 17, height 5\' 8"  (1.727 m), weight 106 kg, SpO2 95 %. ? ?Medical Problem List and Plan: ?1. 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            -patient may not shower ?            -ELOS/Goals: 5/19 min/mod A ?           Continue CIR- PT, OT and SLP ?2.  Antithrombotics: ?-DVT/anticoagulation:  Pharmaceutical: Lovenox added as 5 days out from [facility] ?            -antiplatelet therapy:  on ASA/Brillinta ?3. Pain Management: Tylenol prn.  ?4. Mood: LCSW to follow for evaluation and support when appropriate.  ?            -antipsychotic agents: N/A ?5. Neuropsych: This patient is not capable of making decisions on his own behalf. ?6. Skin/Wound Care: Routine pressure relief measures.  ?7. Fluids/Electrolytes/Nutrition: Will monitor I/O. CMET ok except low alb  ?8. Hyponatremia: Stable. Monitor with serial checks.  ?9. Prediabetes: Hgb A1C-6.0. fair control  ?-CBG (last 3)  ?Recent Labs  ?  03/02/22 ?1157 03/02/22 ?2100 03/03/22 ?0552  ?GLUCAP 93 124* 121*  ? ?Stopped CBGs due to DC TF  ?10. Dysphagia: D3 thin liq  ?          eating 75-100% meals  ?11. Leucocytosis: resolved  ?         ? ?  Latest Ref Rng & Units 03/01/2022  ?  6:01 AM 02/22/2022  ?  6:09 AM 02/19/2022  ?  5:27 AM  ?CBC  ?WBC 4.0 - 10.5 K/uL 6.8   8.8   8.7    ?Hemoglobin 13.0 - 17.0 g/dL 15.2   15.1   15.8    ?Hematocrit 39.0 - 52.0 % 43.0   43.5   45.3    ?Platelets 150 - 400 K/uL 239   222   186    ?  ?12. Elevated LDL: continue Lipitor. ?13.  AKI: resolved although BUN mildly elevated enc po fluid  ? 5/1- Cr 1.08 and BUN doing well at 18= con't to monitor ? ?  Latest Ref Rng & Units 03/01/2022  ?  6:01 AM 02/22/2022  ?  6:09 AM 02/21/2022   ?  6:36 AM  ?BMP  ?Glucose 70 - 99  mg/dL 112   142   152    ?BUN 6 - 20 mg/dL 18   25   23     ?Creatinine 0.61 - 1.24 mg/dL 1.08   1.06   1.01    ?Sodium 135 - 145 mmol/L 137   137   137    ?Potassium 3.5 - 5.1 mmol/L 3.7   4.1   4.1    ?Chloride 98 - 111 mmol/L 105   106   105    ?CO2 22 - 32 mmol/L 24   24   24     ?Calcium 8.9 - 10.3 mg/dL 8.8   8.5   8.7    ? ? ?14. Hypocalcemia:  Ionized Ca- 1.11 with normal albumin-4.1.  ?--Add calcium supplement bid.  ?15. Elevated Homocysteine levels:  B12, folate WNL--per Dr. Erlinda Hong , neuro, who felt no treatment needed as less than 50.  ?16. Obesity: BMI 35.53- provide dietary education. ? ?  ? ?LOS: ?15 days ?A FACE TO FACE EVALUATION WAS PERFORMED ? ?Luanna Salk Jacelyn Cuen ?03/05/2022, 8:53 AM  ? ? ? ?

## 2022-03-05 NOTE — Progress Notes (Signed)
Lower extremity venous bilateral study completed. ? ?Preliminary results relayed to Wynn Banker, MD and Johnella Moloney, RN. ? ?See CV Proc for preliminary results report.  ? ?Jean Rosenthal, RDMS, RVT ? ?

## 2022-03-05 NOTE — Progress Notes (Signed)
Occupational Therapy Session Note ? ?Patient Details  ?Name: Gordon Fisher ?MRN: [id number] ?Date of Birth: 05-Jul-1961 ? ?Today's Date: 03/05/2022 ?OT Individual Time: 1415-1500 ?OT Individual Time Calculation (min): 45 min  ?15 min missed d/t nursing/PA care.  ? ? ?Short Term Goals: ?Week 2:  OT Short Term Goal 1 (Week 2): pt will complete 1/3 toileting tasks with MOD A ?OT Short Term Goal 2 (Week 2): pt will sit>stand from EOB with MIN A +2 during LB dressing ?OT Short Term Goal 3 (Week 2): pt will initiate hemi technique for UB dressing with < 3 verbal cues ?OT Short Term Goal 4 (Week 2): pt will initiate grooming tasks at sink with MIN A ? ?Skilled Therapeutic Interventions/Progress Updates:  ?  Pt received sitting up in the TIS w/c. When asked about pain he became anxious- shaking L foot continuously, stating yes perseveratively and shaking head. Was able to determine pain was in his R foot/knee to some degree. Doffed AFO on his R and inspected skin. Several small areas of redness but nothing of concern or with increased pain with palpation. He was taken via w/c to the therapy gym. He completed a lateral scoot to the mat with min A. He felt warm to the touch so vitals were assessed- temp 98.0. HR 130 bpm. Alerted RN who notified PA. PA came to session and assessed. He transferred to supine and completed several overhead weightbearing through full extension activities with no activation palpated in RUE. He transferred back to sitting EOM and had a difficult time maintaining his balance statically initially, requiring max A and then was finally able to sit with (S). He completed a squat pivot with max A to the w/c. He was taken back to the room and transferred back to bed with max A. He required max A for brief change d/t urinary incontinence. He was left supine with all needs met, bed alarm set.  ? ?Therapy Documentation ?Precautions:  ?Precautions ?Precautions: Fall, Other (comment) ?Precaution Comments: R hemi,  global aphasia, cortrak ?Restrictions ?Weight Bearing Restrictions: No ? ?Therapy/Group: Individual Therapy ? ?Curtis Sites ?03/05/2022, 6:16 AM ?

## 2022-03-05 NOTE — Progress Notes (Signed)
Physical Therapy Session Note ? ?Patient Details  ?Name: Gordon Fisher ?MRN: [id number] ?Date of Birth: 1961-09-16 ? ?Today's Date: 03/05/2022 ?PT Individual Time: 0915-1000 ?PT Individual Time Calculation (min): 45 min  ? ?Short Term Goals: ?Week 2:  PT Short Term Goal 1 (Week 2): Pt will perform supine<>sit with min assist ?PT Short Term Goal 2 (Week 2): Pt will perform sit<>stands using LRAD with mod assist  of 1 ?PT Short Term Goal 3 (Week 2): Pt will perform bed<>chair transfers using LRAD wtih mod assist of 1 ?PT Short Term Goal 4 (Week 2): Pt will ambulate at least 31f using LRAD with max assist of 1 and +2 min assist ?PT Short Term Goal 5 (Week 2): Pt will participate in [place]? ?Skilled Therapeutic Interventions/Progress Updates:  ?   ?Pt presenting supine in bed - appearing agreeable to PT tx - aphasia limiting. Pt in hospital gown and incontinent of urine with saturated brief. TotalA for brief change at bed level - rolling towards his R with minA using bed rail and modA for rolling L using bed rail. Donned pants totalA bed level as well. Supine<>sitting EOB with HOB slightly raised, used bed rail and modA for trunk support and RLE management. Donned socks/shoes and R AFO with totalA for time. Squat<>pivot with +2 modA from EOB to TIS w/c, cues for setup and sequencing as well as facilitation for forward weight shift.  ? ?Transported in TIS w/c to main rehab gym and assisted to mat table in similar manner. Sitting EOM with supervision - flexible anterior/posterior pelvic tilt. Using back of arm chair for UE support on L and his R arm draped around therapist shoulder with PT blocking his R knee, sit<>stand with min/modA with delayed activation of R quad and posterior chain. Requires mod/maxA for standing balance with R pushing. Worked on dynamic standing balance, reaching outside BOS, and pre-gait stepping with his L. Required maxA for all balance activities and pt unable to sequence  reaching tasks due to aphasia.  ? ?Pt assisted back to his w/c via modA squat<>pivot transfer and then returned to his room where he was tilted in TIS for safety, belt alarm on, call bell in lap, all needs met.  ? ?Therapy Documentation ?Precautions:  ?Precautions ?Precautions: Fall, Other (comment) ?Precaution Comments: R hemi, global aphasia, cortrak ?Restrictions ?Weight Bearing Restrictions: No ?General: ?  ? ?Therapy/Group: Individual Therapy ? ?Ulla Mckiernan P Marilynne Dupuis PT ?03/05/2022, 7:30 AM  ?

## 2022-03-05 NOTE — Progress Notes (Signed)
Patient with febrile episode--felt to be due to UTI and rocephin started. Had pain this afternoon with WB through RLE, Found to have DVT in gastrocnemius and peroneal vein. Discussed with Dr. Margreta Journey monitor by recheck dopplers in a week. Wife updated. ?

## 2022-03-05 NOTE — Progress Notes (Signed)
Speech Language Pathology Weekly Progress and Session Note ? ?Patient Details  ?Name: Gordon Fisher ?MRN: [id number] ?Date of Birth: 1961/06/15 ? ?Beginning of progress report period: February 26, 2022 ?End of progress report period: Mar 05, 2022 ? ?Today's Date: 03/05/2022 ?SLP Individual Time: 0800-0900 ?SLP Individual Time Calculation (min): 60 min ? ?Short Term Goals: ?Week 2: SLP Short Term Goal 1 (Week 2): Patient will consume current diet with minimal s/s of aspiration with with min A cues to implement safe swallowing strategies/precautions (slow rate, small bites/sips) ?SLP Short Term Goal 1 - Progress (Week 2): Met ?SLP Short Term Goal 2 (Week 2): Patient will respond to yes/no questions via multimodal means with mod A multimodal cues to achieve 80% accuracy ?SLP Short Term Goal 2 - Progress (Week 2): Updated due to goal met ?SLP Short Term Goal 3 (Week 2): Patient will ID field of 3 written words by attribute/function/features with mod A multimodal cues to achieve 75% accuracy ?SLP Short Term Goal 3 - Progress (Week 2): Updated due to goal met ?SLP Short Term Goal 4 (Week 2): Pt will ID field of 3 objects attribute/function/features with mod A multimodal cues to achieve 75% accuracy ?SLP Short Term Goal 4 - Progress (Week 2): Updated due to goal met ?SLP Short Term Goal 5 (Week 2): Patient complete object-to-word matching with mod A multimodal cues to achieve 75% accuracy ?SLP Short Term Goal 5 - Progress (Week 2): Updated due to goal met ?SLP Short Term Goal 6 (Week 2): Pt will approximate/imitate at the sound level with max A to achieve 50% accuracy with max A multimodal cues. ?SLP Short Term Goal 6 - Progress (Week 2): Met ? ?New Short Term Goals: ?Week 3: SLP Short Term Goal 1 (Week 3): Patient will consume current diet with minimal s/s of aspiration with mod I to monitor anterior and clear spillage ?SLP Short Term Goal 2 (Week 3): Patient will clarify responses to yes/no questions via multimodal means using  gestures (thumbs up/down) or communication board during 50% of opportunities with sup A verbal cues ?SLP Short Term Goal 3 (Week 3): Patient will answer complex yes/no questions with mod-to-max A to achieve 50% accuracy ?SLP Short Term Goal 4 (Week 3): Patient will identify the appropriate object/picture following a verbal description of 2-3 clues with 70% accuracy given mod A verbal cues ?SLP Short Term Goal 5 (Week 3): Patient will approximate written responses to object naming tasks by writing initial 1-2 letters or fill-in-the blank cueing with max A ?SLP Short Term Goal 6 (Week 3): Pt will approximate/imitate at the sound level achieve 75% accuracy with max A multimodal cues. ? ?Weekly Progress Updates: Patient continues to make excellent gains and has met 6 of 6 STGs this reporting period due to improved expressive/receptive language skills and swallow function and safety. Patient is currently completing basic-to-mildly complex comprehension tasks with min A, verbal repetition, and additional processing time. Patient can communicate basic, functional needs through facilitation of a skilled clinician using multimodal means with min A for facilitation of yes/no responses, gestures, and/or pointing to minimal field of written choices. Initiation of written responses has improved with writing familiar names, birthday, age, etc. He also has demonstrated ability to approximate written words by writing initial 1-2 letters during object naming tasks with max A cues. 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  Patient is tolerating diet advancement of a regular diet and thin liquids with mod I-to-sup A for monitoring and clearing R anterior labial spillage. Pt has progressed from requiring full supervision during PO intake/meals, to only  set-up A and intermittent supervision due to improved safety with intake. Patient and family education is ongoing. Patient would benefit from continued skilled SLP intervention to maximize language and swallow functioning prior to discharge.   ?  ?Intensity: Minumum of 1-2 x/day, 30 to 90 minutes ?Frequency: 3 to 5 out of 7 days ?Duration/Length of Stay: 5/19 ?Treatment/Interventions: Cognitive remediation/compensation;Environmental controls;Multimodal communication approach;Speech/Language Firefighter;Dysphagia/aspiration precaution training;Functional tasks;Patient/family education;Therapeutic Activities ? ?Daily Session ?Skilled Therapeutic Interventions: Skilled ST treatment focused on communication goals. SLP facilitated session by providing min A verbal cues for identifying field of 3 written words by function/feature with min A to achieve 80% accuracy. SLP facilitated verbal production with max A multimodal cues to achieve 60% accuracy - pt most successful with production of vowels, bilabial and velar consonants. Pt approximated his name "[name]" given max A multimodal A fading to mod A with repetition. Pt exhibited increased difficulty producing /aI/ vowel (as in the words I, eye, my) vowel thus production of his name was perceived as "Muck". Pt perseverated on "muck" with all subsequent attempts at verbalization at the phoneme and word level. 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 Pt was given dry erase board to write freely without prompt. He wrote "Your sign is in." Pt shook his head and indicated that was not his intention. SLP was unable to determine topic despite attempts. Patient was left in bed with alarm activated and immediate needs within reach at end of session. Continue per current plan of care.     ? ?General  ?  ?Pain ? None ? ?Therapy/Group: Individual Therapy ? ?Abdulraheem Pineo T Emauri Krygier ?03/05/2022, 4:27 PM ? ? ? ? ? ? ?

## 2022-03-05 NOTE — Progress Notes (Signed)
?   03/05/22 1539  ?Assess: MEWS Score  ?Temp (!) 101.4 ?F (38.6 ?C)  ?BP 128/80  ?Pulse Rate (!) 102  ?Resp 19  ?SpO2 96 %  ?Assess: MEWS Score  ?MEWS Temp 1  ?MEWS Systolic 0  ?MEWS Pulse 1  ?MEWS RR 0  ?MEWS LOC 0  ?MEWS Score 2  ?MEWS Score Color Yellow  ?Assess: if the MEWS score is Yellow or Red  ?Were vital signs taken at a resting state? Yes  ?Focused Assessment No change from prior assessment  ?Early Detection of Sepsis Score *See Row Information* Low  ?MEWS guidelines implemented *See Row Information* Yes  ?Take Vital Signs  ?Increase Vital Sign Frequency  Yellow: Q 2hr X 2 then Q 4hr X 2, if remains yellow, continue Q 4hrs  ?Escalate  ?MEWS: Escalate Yellow: discuss with charge nurse/RN and consider discussing with provider and RRT  ?Notify: Charge Nurse/RN  ?Name of Charge Nurse/RN Notified Mekides  ?Date Charge Nurse/RN Notified 03/05/22  ?Time Charge Nurse/RN Notified 1545  ?Notify: Provider  ?Provider Name/Title Delle Reining  ?Date Provider Notified 03/05/22  ?Time Provider Notified [phone]  ?Notification Type Call  ?Notification Reason Change in status  ?Provider response See new orders  ?Date of Provider Response 03/05/22  ?Time of Provider Response 1548  ?Document  ?Patient Outcome Stabilized after interventions  ?Progress note created (see row info) Yes  ? ? ?

## 2022-03-05 NOTE — Progress Notes (Signed)
Physical Therapy Session Note ? ?Patient Details  ?Name: Gordon Fisher ?MRN: [id number] ?Date of Birth: [birth year] ? ?Today's Date: 03/05/2022 ?PT Individual Time: 7494-4967 ?PT Individual Time Calculation (min): 40 min    ? ?Short Term Goals: ? ?Week 2:  PT Short Term Goal 1 (Week 2): Pt will perform supine<>sit with min assist ?PT Short Term Goal 2 (Week 2): Pt will perform sit<>stands using LRAD with mod assist  of 1 ?PT Short Term Goal 3 (Week 2): Pt will perform bed<>chair transfers using LRAD wtih mod assist of 1 ?PT Short Term Goal 4 (Week 2): Pt will ambulate at least 77f using LRAD with max assist of 1 and +2 min assist ?PT Short Term Goal 5 (Week 2): Pt will participate in [place]?Week 3:    ? ?Skilled Therapeutic Interventions/Progress Updates:  ? ?Pt received sitting in WC and agreeable to PT ? ?Bp ASSESSED. 112/80, HR 111, SpO2 97%.  ? ?Sit<>stand with RW max assis twith heavy lateral lean to the R with poor WB through RLE required PT to facilitate all knee extension on the RLE .  ? ?Sit stand with maxisky walking harness. Gait with RW x 41fwith Maxisky harness max assist to prevnt LOB to the R and block R knee to stop buckling. Standing balance with RUE supported on PT with visual feedback from mirror static standing, weight shifting, LLE stepping each performed 2 x 30 sec with theraputic rest break. Noted to have facial grimace in standing on the RLE. Max-total A throughout to prevent LOB to the R  ? ?[facility] returned to [facility] and [facility] reach and all needs met.   ? ?   ? ?Therapy Documentation ?Precautions:  ?Precautions ?Precautions: Fall, Other (comment) ?Precaution Comments: R hemi, global aphasia, cortrak ?Restrictions ?Weight Bearing Restrictions: No ? ?  ?Pain: ? Faces: hurts little. RLE? ? ? ? ?Therapy/Group: Individual Therapy ? ?AuLorie Phenix5/03/2022, 2:05 PM  ?

## 2022-03-06 DIAGNOSIS — I69391 Dysphagia following cerebral infarction: Secondary | ICD-10-CM

## 2022-03-06 DIAGNOSIS — I69320 Aphasia following cerebral infarction: Secondary | ICD-10-CM

## 2022-03-06 DIAGNOSIS — E871 Hypo-osmolality and hyponatremia: Secondary | ICD-10-CM

## 2022-03-06 LAB — LACTIC ACID, PLASMA: Lactic Acid, Venous: 1 mmol/L (ref 0.5–1.9)

## 2022-03-06 NOTE — Progress Notes (Signed)
Speech Language Pathology Daily Session Note ? ?Patient Details  ?Name: Gordon Fisher ?MRN: [id number] ?Date of Birth: [birth year] ? ?Today's Date: 03/06/2022 ?SLP Individual Time: 1017-5102 ?SLP Individual Time Calculation (min): 58 min ? ?Short Term Goals: ?Week 3: SLP Short Term Goal 1 (Week 3): Patient will consume current diet with minimal s/s of aspiration with mod I to monitor anterior and clear spillage ?SLP Short Term Goal 2 (Week 3): Patient will clarify responses to yes/no questions via multimodal means using gestures (thumbs up/down) or communication board during 50% of opportunities with sup A verbal cues ?SLP Short Term Goal 3 (Week 3): Patient will answer complex yes/no questions with mod-to-max A to achieve 50% accuracy ?SLP Short Term Goal 4 (Week 3): Patient will identify the appropriate object/picture following a verbal description of 2-3 clues with 70% accuracy given mod A verbal cues ?SLP Short Term Goal 5 (Week 3): Patient will approximate written responses to object naming tasks by writing initial 1-2 letters or fill-in-the blank cueing with max A ?SLP Short Term Goal 6 (Week 3): Pt will approximate/imitate at the sound level achieve 75% accuracy with max A multimodal cues. ? ?Skilled Therapeutic Interventions: ?Pt seen for skilled ST with focus on communication and swallowing goals, pt in bed with wife present throughout. Wife reports pt not feeling well and didn't sleep well. SLP providing patient with water to attempt to increase fluid intake, reports he is tolerating a regular diet however intake has been minimal d/t poor appetite. Wife has attempted to get him to eat food from home but minimal success. SLP facilitating mildly complex yes/no questions with pt accuracy ~70% utilizing thumbs up/down. Pt demonstrates difficulty with comparing ideals (I.e. is a gallon more than a cup, is a whole more than a half, does a brick weigh more than a feather). 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 Pt able to write name and birth date accurately and legibly, demonstrates extra processing time and increased errors when writing to dictation. Pt unable to produce signature, instead printing name again. When asked how long patient has been married pt correctly writing "36" on white board. Pt encouraged to complete language, communicating and writing tasks independently outside of therapy sessions for extra practice, wife reports difficulty with pt wanting to work with her on recommended tasks. Pt left in bed with wife present for needs, cont ST POC. ? ?Pain ?Pain Assessment ?Pain Scale: 0-10 ?Pain Score: 0-No pain ? ?Therapy/Group: Individual Therapy ? ?Tacey Ruiz ?03/06/2022, 11:31 AM ?

## 2022-03-06 NOTE — Progress Notes (Addendum)
?                                                       PROGRESS NOTE ? ? ?Subjective/Complaints: ? ?Patient a bit restless last night.  Wife at bedside.  Says he did not sleep all that much.  He does not want to use much medication at home. ? ?ROS: limited due to language/communication  ? ?Objective: ?  ?DG Chest 2 View ? ?Result Date: 03/05/2022 ?CLINICAL DATA:  Chest pain and fever EXAM: CHEST - 2 VIEW COMPARISON:  02/13/2022 FINDINGS: Low lung volumes with bibasilar atelectasis, worse on left. Difficult to exclude basilar pneumonia. No large effusion or pneumothorax. Trachea midline. prominent heart size suspect related to low lung volumes. Monitor device overlies the left upper chest. Lateral view is limited because positioning. IMPRESSION: Low volume exam with bibasilar atelectasis.  Limited exam. Electronically Signed   By: Judie Petit.  Shick M.D.   On: 03/05/2022 16:45  ? ?DG Knee Complete 4 Views Right ? ?Result Date: 03/05/2022 ?CLINICAL DATA:  Pain, fever EXAM: RIGHT KNEE - COMPLETE 4+ VIEW COMPARISON:  None Available. FINDINGS: No evidence of fracture, dislocation, or joint effusion. No evidence of arthropathy or other focal bone abnormality. Mild prepatellar soft tissue swelling. Atherosclerotic vascular calcifications. IMPRESSION: 1. No acute osseous abnormality, right knee. 2. Mild prepatellar soft tissue swelling. Electronically Signed   By: Duanne Guess D.O.   On: 03/05/2022 16:36  ? ?DG Foot Complete Right ? ?Result Date: 03/05/2022 ?CLINICAL DATA:  Pain, fever EXAM: RIGHT FOOT COMPLETE - 3+ VIEW COMPARISON:  None Available. FINDINGS: There is no evidence of fracture or dislocation. No erosion. No periosteal elevation. There is no evidence of arthropathy or other focal bone abnormality. Atherosclerotic vascular calcifications are present. Soft tissues are unremarkable. IMPRESSION: Negative. Electronically Signed   By: Duanne Guess D.O.   On: 03/05/2022 16:36  ? ?VAS [place] LOWER EXTREMITY VENOUS  (DVT) ? ?Result Date: 03/05/2022 ? Lower Venous DVT Study Patient Name:  Gordon Fisher  Date of Exam:   03/05/2022 Medical Rec #: [id number]        Accession #:    [id number] Date of Birth: 1960-12-01        Patient Gender: M Patient Age:   61 years Exam Location:  [facility] Procedure:      VAS [place] LOWER EXTREMITY VENOUS (DVT) Referring Phys: PAMELA LOVE --------------------------------------------------------------------------------  Indications: Pain in both legs RT>LT.  Comparison Study: No prior studies. Performing Technologist: Jean Rosenthal RDMS, RVT  Examination Guidelines: A complete evaluation includes B-mode imaging, spectral Doppler, color Doppler, and power Doppler as needed of all accessible portions of each vessel. Bilateral testing is considered an integral part of a complete examination. Limited examinations for reoccurring indications may be performed as noted. The reflux portion of the exam is performed with the patient in reverse Trendelenburg.  +---------+---------------+---------+-----------+----------+-------------------+ RIGHT    CompressibilityPhasicitySpontaneityPropertiesThrombus Aging      +---------+---------------+---------+-----------+----------+-------------------+ CFV      Full           Yes      Yes                                      +---------+---------------+---------+-----------+----------+-------------------+ SFJ  Full                                                             +---------+---------------+---------+-----------+----------+-------------------+ FV Prox  Full                                                             +---------+---------------+---------+-----------+----------+-------------------+ FV Mid   Full                                                             +---------+---------------+---------+-----------+----------+-------------------+ FV DistalFull                                                              +---------+---------------+---------+-----------+----------+-------------------+ PFV      Full                                                             +---------+---------------+---------+-----------+----------+-------------------+ POP      Full           Yes      Yes                                      +---------+---------------+---------+-----------+----------+-------------------+ PTV      Full                                                             +---------+---------------+---------+-----------+----------+-------------------+ PERO     None           No       No                   Acute- single                                                             paired vessel       +---------+---------------+---------+-----------+----------+-------------------+ Gastroc  None           No       No  Acute               +---------+---------------+---------+-----------+----------+-------------------+   +---------+---------------+---------+-----------+----------+--------------+ LEFT     CompressibilityPhasicitySpontaneityPropertiesThrombus Aging +---------+---------------+---------+-----------+----------+--------------+ CFV      Full           Yes      Yes                                 +---------+---------------+---------+-----------+----------+--------------+ SFJ      Full                                                        +---------+---------------+---------+-----------+----------+--------------+ FV Prox  Full                                                        +---------+---------------+---------+-----------+----------+--------------+ FV Mid   Full                                                        +---------+---------------+---------+-----------+----------+--------------+ FV DistalFull                                                         +---------+---------------+---------+-----------+----------+--------------+ PFV      Full                                                        +---------+---------------+---------+-----------+----------+--------------+ POP      Full           Yes      Yes                                 +---------+---------------+---------+-----------+----------+--------------+ PTV      Full                                                        +---------+---------------+---------+-----------+----------+--------------+ PERO     Full                                                        +---------+---------------+---------+-----------+----------+--------------+ Gastroc  Full                                                        +---------+---------------+---------+-----------+----------+--------------+  Summary: RIGHT: - Findings consistent with acute deep vein thrombosis involving the right gastrocnemius veins, and right peroneal veins.  - No cystic structure found in the popliteal fossa.  LEFT: - There is no evidence of deep vein thrombosis in the lower extremity.  - No cystic structure found in the popliteal fossa.  *See table(s) above for measurements and observations. Electronically signed by Harold Barban MD on 03/05/2022 at 8:14:23 PM.    Final    ?Recent Labs  ?  03/05/22 ?1756  ?WBC 15.4*  ?HGB 15.6  ?HCT 44.4  ?PLT 205  ? ?No results for input(s): NA, K, CL, CO2, GLUCOSE, BUN, CREATININE, CALCIUM in the last 72 hours. ? ? ?Intake/Output Summary (Last 24 hours) at 03/06/2022 0923 ?Last data filed at 03/06/2022 0300 ?Gross per 24 hour  ?Intake 160 ml  ?Output --  ?Net 160 ml  ?  ? ?  ? ?Physical Exam: ?Vital Signs ?Blood pressure 131/73, pulse 96, temperature 98.9 ?F (37.2 ?C), temperature source Oral, resp. rate 17, height 5\' 8"  (1.727 m), weight 106 kg, SpO2 92 %. ? ? ? ? ?Constitutional: No distress . Vital signs reviewed. ?HEENT: NCAT, EOMI, oral membranes moist ?Neck:  supple ?Cardiovascular: RRR without murmur. No JVD    ?Respiratory/Chest: CTA Bilaterally without wheezes or rales. Normal effort    ?GI/Abdomen: BS +, non-tender, non-distended ?Ext: no clubbing, cyanosis, trace right lower extremity edema ?Psych: pleasant and cooperative  ?Skin: A few bruises on the right leg ?Neurologic: global aphasia Exp>recept--stable.  Nods and answers yes to most questions.  Does follow simple commands more than 50% o

## 2022-03-06 NOTE — Progress Notes (Signed)
Physical Therapy Session Note ? ?Patient Details  ?Name: Gordon Fisher ?MRN: [id number] ?Date of Birth: 1961-05-13 ? ?Today's Date: 03/06/2022 ?PT Individual Time: 1300-1400 ?PT Individual Time Calculation (min): 60 min  ? ?Short Term Goals: ?Week 1:  PT Short Term Goal 1 (Week 1): Pt will transfer sup <> sit w/ mod A ?PT Short Term Goal 1 - Progress (Week 1): Met ?PT Short Term Goal 2 (Week 1): Pt will transfer sit to stand w/ Max A from lower surface. ?PT Short Term Goal 2 - Progress (Week 1): Met ?PT Short Term Goal 3 (Week 1): Pt will perform SPT w/ max A ?PT Short Term Goal 3 - Progress (Week 1): Met ?Week 2:  PT Short Term Goal 1 (Week 2): Pt will perform supine<>sit with min assist ?PT Short Term Goal 2 (Week 2): Pt will perform sit<>stands using LRAD with mod assist  of 1 ?PT Short Term Goal 3 (Week 2): Pt will perform bed<>chair transfers using LRAD wtih mod assist of 1 ?PT Short Term Goal 4 (Week 2): Pt will ambulate at least 64f using LRAD with max assist of 1 and +2 min assist ?PT Short Term Goal 5 (Week 2): Pt will participate in [place]? ?Skilled Therapeutic Interventions/Progress Updates:  ?  Pt seated in w/c on arrival and agreeable to therapy. Pt indicated no pain at start of session. Pt able to communicate with head nods and thumbs up/down with yes/no questioning consistently throughout session.  ? ?BP in sitting at start of session: 117/78. HR=100 bpm, O2=96%. HR noted to increase to 111 bpm with activity. ? ?Gait training with DF assist wrap and leg lifter. When questioned about AFO, pt vehemently shook his head and expressed desire not to wear AFO, so utilized ace wrap instead.  ?2 trials with 3 musketeers technique, 2 x 35 ft with +3 w/c follow. Step to pattern with lean toward L side that improved on second bout.  ?X 30 ft with hall way rail, +2 for w/c follow. Continued step to technique with max A to advance RLE and maintain standing balance ?X 16 ft and x 18 ft with RW, +2  to manage RW and +3 for w/c follow. First bout with step through pattern, tot A for RLE management, but pt fatigued quickly and had posterior LOB, leading to quick sit to chair with assist. Second bout with step to pattern d/t fatigue and tot A for RLE management.  ? ?Minimal knee block required during gait throughout session, but consistent assist for swing and to place foot, as well as trunk management/weight shifting. ? ?Pt returned to room and performed mod A stand/squat pivot to R side, assist to shift weight and pivot. Min A sit>supine. At this time doffed pants and brief with min A rolling, and noted that pure wick was leaking. Set up with towel to control leak and notified nsg to address catheter. Pt was left with all needs in reach and alarm active, his wife present. ? ?Therapy Documentation ?Precautions:  ?Precautions ?Precautions: Fall, Other (comment) ?Precaution Comments: R hemi, global aphasia, cortrak ?Restrictions ?Weight Bearing Restrictions: No ?General: ?  ? ? ? ?Therapy/Group: Individual Therapy ? ?[place]?03/06/2022, 4:32 PM  ?

## 2022-03-06 NOTE — Progress Notes (Signed)
Occupational Therapy Weekly Progress Note ? ?Patient Details  ?Name: Gordon Fisher ?MRN: [id number] ?Date of Birth: [birth year] ? ?Beginning of progress report period: February 26, 2022 ?End of progress report period: Mar 06, 2022 ? ?Today's Date: 03/06/2022 ? ?Patient has met 3 of 4 short term goals. Pt currently requires MOD A for bathing from shower level, MOD A for Ub dressing with improved intiation of hemi techniques this week, MAX A for LB dressing, and MODA  for squat pivot transfers. 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 This reporting period pt with medical decline patient with febrile episode on 5/5 felt to be due to UTI. DVT in gastrocnemius and peroneal vein impacting functional progress. Pts wife has been present during OT sessions. Plan to downgrade goals.  ? ?Patient continues to demonstrate the following deficits: muscle weakness, decreased cardiorespiratoy endurance, decreased coordination and decreased motor planning, decreased attention to right, decreased initiation, decreased attention, decreased awareness, decreased problem solving, decreased safety awareness, decreased memory, and delayed processing, and decreased sitting balance and decreased standing balance and therefore will continue to benefit from skilled OT intervention to enhance overall performance with BADL. ? ?Patient not progressing toward long term goals.  See goal revision..  Plan of care revisions: Several goals downgraded. ? ?OT Short Term Goals ?Week 1:  OT Short Term Goal 1 (Week 1): Pt will maintain static sitting balance EOB with min A >5 min. ?OT Short Term Goal 1 - Progress (Week 1): Met ?OT Short Term Goal 2 (Week 1): Pt will complete toilet transfer with max A of 2 and no lift. ?OT Short Term Goal 2 - Progress (Week 1): Met ?OT Short Term Goal 3 (Week 1): Pt will don shirt in support sitting with min A. ?OT Short Term  Goal 3 - Progress (Week 1): Progressing toward goal ?OT Short Term Goal 4 (Week 1): Pt will position RUE on pillow with max A. ?OT Short Term Goal 4 - Progress (Week 1): Met ?Week 2:  OT Short Term Goal 1 (Week 2): pt will complete 1/3 toileting tasks with MOD A ?OT Short Term Goal 1 - Progress (Week 2): Progressing toward goal ?OT Short Term Goal 2 (Week 2): pt will sit>stand from EOB with MIN A +2 during LB dressing ?OT Short Term Goal 2 - Progress (Week 2): Met ?OT Short Term Goal 3 (Week 2): pt will initiate hemi technique for UB dressing with < 3 verbal cues ?OT Short Term Goal 3 - Progress (Week 2): Met ?OT Short Term Goal 4 (Week 2): pt will initiate grooming tasks at sink with MIN A ?OT Short Term Goal 4 - Progress (Week 2): Met ?Week 3:  OT Short Term Goal 1 (Week 3): pt will complete sit>stand with MIN A+1 wih LRAD ?OT Short Term Goal 2 (Week 3): pt will complete UB bathing with MINA ?OT Short Term Goal 3 (Week 3): pt will complete LBbathing with MOD A +1 ?   ? ?Therapy Documentation ?Precautions:  ?Precautions ?Precautions: Fall, Other (comment) ?Precaution Comments: R hemi, global aphasia, cortrak ?Restrictions ?Weight Bearing Restrictions: No ? ?Therapy/Group: Individual Therapy ? ?[name] ?03/06/2022, 4:08 PM  ?

## 2022-03-07 LAB — URINE CULTURE: Culture: >=100000 — AB

## 2022-03-07 LAB — GLUCOSE, CAPILLARY
Glucose-Capillary: 119 mg/dL — ABNORMAL HIGH (ref 70–99)
Glucose-Capillary: 126 mg/dL — ABNORMAL HIGH (ref 70–99)

## 2022-03-07 NOTE — Progress Notes (Signed)
?                                                       PROGRESS NOTE ? ? ?Subjective/Complaints: ? ?Indicates that he had a better night. Appears to be in good spirits this am ? ?ROS: limited due to language/communication  ? ?Objective: ?  ?DG Chest 2 View ? ?Result Date: 03/05/2022 ?CLINICAL DATA:  Chest pain and fever EXAM: CHEST - 2 VIEW COMPARISON:  02/13/2022 FINDINGS: Low lung volumes with bibasilar atelectasis, worse on left. Difficult to exclude basilar pneumonia. No large effusion or pneumothorax. Trachea midline. prominent heart size suspect related to low lung volumes. Monitor device overlies the left upper chest. Lateral view is limited because positioning. IMPRESSION: Low volume exam with bibasilar atelectasis.  Limited exam. Electronically Signed   By: Jerilynn Mages.  Shick M.D.   On: 03/05/2022 16:45  ? ?DG Knee Complete 4 Views Right ? ?Result Date: 03/05/2022 ?CLINICAL DATA:  Pain, fever EXAM: RIGHT KNEE - COMPLETE 4+ VIEW COMPARISON:  None Available. FINDINGS: No evidence of fracture, dislocation, or joint effusion. No evidence of arthropathy or other focal bone abnormality. Mild prepatellar soft tissue swelling. Atherosclerotic vascular calcifications. IMPRESSION: 1. No acute osseous abnormality, right knee. 2. Mild prepatellar soft tissue swelling. Electronically Signed   By: Davina Poke D.O.   On: 03/05/2022 16:36  ? ?DG Foot Complete Right ? ?Result Date: 03/05/2022 ?CLINICAL DATA:  Pain, fever EXAM: RIGHT FOOT COMPLETE - 3+ VIEW COMPARISON:  None Available. FINDINGS: There is no evidence of fracture or dislocation. No erosion. No periosteal elevation. There is no evidence of arthropathy or other focal bone abnormality. Atherosclerotic vascular calcifications are present. Soft tissues are unremarkable. IMPRESSION: Negative. Electronically Signed   By: Davina Poke D.O.   On: 03/05/2022 16:36  ? ?VAS [place] LOWER EXTREMITY VENOUS (DVT) ? ?Result Date: 03/05/2022 ? Lower Venous DVT Study Patient Name:   Gordon Fisher  Date of Exam:   03/05/2022 Medical Rec #: [id number]        Accession #:    [id number] Date of Birth: Sep 17, 1961        Patient Gender: M Patient Age:   61 years Exam Location:  [facility] Procedure:      VAS [place] LOWER EXTREMITY VENOUS (DVT) Referring Phys: PAMELA LOVE --------------------------------------------------------------------------------  Indications: Pain in both legs RT>LT.  Comparison Study: No prior studies. Performing Technologist: Darlin Coco RDMS, RVT  Examination Guidelines: A complete evaluation includes B-mode imaging, spectral Doppler, color Doppler, and power Doppler as needed of all accessible portions of each vessel. Bilateral testing is considered an integral part of a complete examination. Limited examinations for reoccurring indications may be performed as noted. The reflux portion of the exam is performed with the patient in reverse Trendelenburg.  +---------+---------------+---------+-----------+----------+-------------------+ RIGHT    CompressibilityPhasicitySpontaneityPropertiesThrombus Aging      +---------+---------------+---------+-----------+----------+-------------------+ CFV      Full           Yes      Yes                                      +---------+---------------+---------+-----------+----------+-------------------+ SFJ      Full                                                             +---------+---------------+---------+-----------+----------+-------------------+  FV Prox  Full                                                             +---------+---------------+---------+-----------+----------+-------------------+ FV Mid   Full                                                             +---------+---------------+---------+-----------+----------+-------------------+ FV DistalFull                                                              +---------+---------------+---------+-----------+----------+-------------------+ PFV      Full                                                             +---------+---------------+---------+-----------+----------+-------------------+ POP      Full           Yes      Yes                                      +---------+---------------+---------+-----------+----------+-------------------+ PTV      Full                                                             +---------+---------------+---------+-----------+----------+-------------------+ PERO     None           No       No                   Acute- single                                                             paired vessel       +---------+---------------+---------+-----------+----------+-------------------+ Gastroc  None           No       No                   Acute               +---------+---------------+---------+-----------+----------+-------------------+   +---------+---------------+---------+-----------+----------+--------------+ LEFT     CompressibilityPhasicitySpontaneityPropertiesThrombus Aging +---------+---------------+---------+-----------+----------+--------------+ CFV      Full           Yes      Yes                                 +---------+---------------+---------+-----------+----------+--------------+  SFJ      Full                                                        +---------+---------------+---------+-----------+----------+--------------+ FV Prox  Full                                                        +---------+---------------+---------+-----------+----------+--------------+ FV Mid   Full                                                        +---------+---------------+---------+-----------+----------+--------------+ FV DistalFull                                                         +---------+---------------+---------+-----------+----------+--------------+ PFV      Full                                                        +---------+---------------+---------+-----------+----------+--------------+ POP      Full           Yes      Yes                                 +---------+---------------+---------+-----------+----------+--------------+ PTV      Full                                                        +---------+---------------+---------+-----------+----------+--------------+ PERO     Full                                                        +---------+---------------+---------+-----------+----------+--------------+ Gastroc  Full                                                        +---------+---------------+---------+-----------+----------+--------------+     Summary: RIGHT: - Findings consistent with acute deep vein thrombosis involving the right gastrocnemius veins, and right peroneal veins.  - No cystic structure found in the popliteal fossa.  LEFT: - There is no evidence of deep vein thrombosis in the lower extremity.  - No cystic structure  found in the popliteal fossa.  *See table(s) above for measurements and observations. Electronically signed by Harold Barban MD on 03/05/2022 at 8:14:23 PM.    Final    ?Recent Labs  ?  03/05/22 ?1756  ?WBC 15.4*  ?HGB 15.6  ?HCT 44.4  ?PLT 205  ? ?No results for input(s): NA, K, CL, CO2, GLUCOSE, BUN, CREATININE, CALCIUM in the last 72 hours. ? ? ?Intake/Output Summary (Last 24 hours) at 03/07/2022 0830 ?Last data filed at 03/07/2022 0100 ?Gross per 24 hour  ?Intake 1196 ml  ?Output 45 ml  ?Net 1151 ml  ?  ? ?  ? ?Physical Exam: ?Vital Signs ?Blood pressure 120/85, pulse 91, temperature 98.9 ?F (37.2 ?C), resp. rate 17, height 5\' 8"  (1.727 m), weight 106 kg, SpO2 91 %. ? ? ? ? ?Constitutional: No distress . Vital signs reviewed. ?HEENT: NCAT, EOMI, oral membranes moist ?Neck: supple ?Cardiovascular: RRR without  murmur. No JVD    ?Respiratory/Chest: CTA Bilaterally without wheezes or rales. 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  Nods and answers yes to most questions.  Does follow simple commands more than 50% of the time. Uses facial gestures to communicate also ? ?Neurologic: Cranial nerves II through XII intact, mot

## 2022-03-08 LAB — BASIC METABOLIC PANEL
Anion gap: 6 (ref 5–15)
BUN: 12 mg/dL (ref 6–20)
CO2: 23 mmol/L (ref 22–32)
Calcium: 8.9 mg/dL (ref 8.9–10.3)
Chloride: 106 mmol/L (ref 98–111)
Creatinine, Ser: 1.04 mg/dL (ref 0.61–1.24)
GFR, Estimated: >60 mL/min (ref >60)
Glucose, Bld: 125 mg/dL — ABNORMAL HIGH (ref 70–99)
Potassium: 3.7 mmol/L (ref 3.5–5.1)
Sodium: 135 mmol/L (ref 135–145)

## 2022-03-08 LAB — CBC
HCT: 41.7 % (ref 39.0–52.0)
Hemoglobin: 14.5 g/dL (ref 13.0–17.0)
MCH: 31.5 pg (ref 26.0–34.0)
MCHC: 34.8 g/dL (ref 30.0–36.0)
MCV: 90.7 fL (ref 80.0–100.0)
Platelets: 184 10*3/uL (ref 150–400)
RBC: 4.6 MIL/uL (ref 4.22–5.81)
RDW: 12.3 % (ref 11.5–15.5)
WBC: 5.7 10*3/uL (ref 4.0–10.5)
nRBC: 0 % (ref 0.0–0.2)

## 2022-03-08 LAB — GLUCOSE, CAPILLARY: Glucose-Capillary: 110 mg/dL — ABNORMAL HIGH (ref 70–99)

## 2022-03-08 MED ORDER — NITROFURANTOIN MONOHYD MACRO 100 MG PO CAPS
100.0000 mg | ORAL_CAPSULE | Freq: Two times a day (BID) | ORAL | Status: AC
  Administered 2022-03-08 – 2022-03-09 (×4): 100 mg via ORAL
  Filled 2022-03-08 (×4): qty 1

## 2022-03-08 MED ORDER — ACETAMINOPHEN 325 MG PO TABS: 325.0000 mg | ORAL_TABLET | ORAL | Status: DC | PRN

## 2022-03-08 MED ORDER — ADULT MULTIVITAMIN W/MINERALS CH: 1.0000 | ORAL_TABLET | Freq: Every day | ORAL | Status: DC

## 2022-03-08 MED ORDER — CALCIUM CARBONATE ANTACID 500 MG PO CHEW
1.0000 | CHEWABLE_TABLET | Freq: Two times a day (BID) | ORAL | Status: DC
Start: 2022-03-08 — End: 2022-03-18

## 2022-03-08 NOTE — Progress Notes (Signed)
Speech Language Pathology Daily Session Note ? ?Patient Details  ?Name: Gordon Fisher ?MRN: [id number] ?Date of Birth: 1961/07/22 ? ?Today's Date: 03/08/2022 ?SLP Individual Time: 0900-1000 ?SLP Individual Time Calculation (min): 60 min ? ?Short Term Goals: ?Week 3: SLP Short Term Goal 1 (Week 3): Patient will consume current diet with minimal s/s of aspiration with mod I to monitor anterior and clear spillage ?SLP Short Term Goal 2 (Week 3): Patient will clarify responses to yes/no questions via multimodal means using gestures (thumbs up/down) or communication board during 50% of opportunities with sup A verbal cues ?SLP Short Term Goal 3 (Week 3): Patient will answer complex yes/no questions with mod-to-max A to achieve 50% accuracy ?SLP Short Term Goal 4 (Week 3): Patient will identify the appropriate object/picture following a verbal description of 2-3 clues with 70% accuracy given mod A verbal cues ?SLP Short Term Goal 5 (Week 3): Patient will approximate written responses to object naming tasks by writing initial 1-2 letters or fill-in-the blank cueing with max A ?SLP Short Term Goal 6 (Week 3): Pt will approximate/imitate at the sound level achieve 75% accuracy with max A multimodal cues. ? ?Skilled Therapeutic Interventions: Skilled ST treatment focused on language goals. SLP facilitated multimodal communication through written communication and gestures. Pt was oriented to situation by pointing to field of 4 written choices for reasoning hospitalization. Pt was oriented to time with mod I, as SLP wrote "Monday" and pt subsequently wrote correct date "Mar 08 2022" without prompting. Pt attempted to write responses to open ended questions, however this consistently resulted in unidentifiable text, likely paraphasic in nature (i.e., "Leat is ceal."). SLP was unable to identify context. SLP facilitated picture naming through written answers with max A multimodal cues. SLP utilized stimuli from [employer]  "naming" app on iPad. Pt approximated 66% of words by writing initial 1-3 letters, and was able to select missing letter(s) given written field of 3 choices during 50% of occasions. There was one instance where pt initiated and spelled the word "egg" with 100% accuracy when shown picture of a cracked egg. It should be noted pt own >70 chickens so anticipate this was a high frequency word. Pt continues to exhibit inconsistencies with yes/no responses such as verbalizing "yeah" but shaking his head no. With additional time, pt was able to clarify responses with additional head nod, or initiation of thumbs up/down gesture during 50% of opportunities. Patient was left in bed with alarm activated and immediate needs within reach at end of session. Continue per current plan of care.   ?   ?Pain ?Pain Assessment ?Pain Scale: 0-10 ?Pain Score: 0-No pain ?Faces Pain Scale: No hurt ? ?Therapy/Group: Individual Therapy ? ?Gordon Fisher T Gordon Fisher ?03/08/2022, 9:12 AM ?

## 2022-03-08 NOTE — Progress Notes (Signed)
?                                                       PROGRESS NOTE ? ? ?Subjective/Complaints: ? ?Reviewed labs, leukocytosis resolved  ? ?ROS: limited due to language/communication  ? ?Objective: ?  ?No results found. ?Recent Labs  ?  03/05/22 ?1756 03/08/22 ?[id number]  ?WBC 15.4* 5.7  ?HGB 15.6 14.5  ?HCT 44.4 41.7  ?PLT 205 184  ? ? ?Recent Labs  ?  03/08/22 ?[id number]  ?NA 135  ?K 3.7  ?CL 106  ?CO2 23  ?GLUCOSE 125*  ?BUN 12  ?CREATININE 1.04  ?CALCIUM 8.9  ? ? ? ?Intake/Output Summary (Last 24 hours) at 03/08/2022 0825 ?Last data filed at 03/08/2022 0600 ?Gross per 24 hour  ?Intake 560 ml  ?Output 600 ml  ?Net -40 ml  ? ?  ? ?  ? ?Physical Exam: ?Vital Signs ?Blood pressure 138/75, pulse 90, temperature 97.9 ?F (36.6 ?C), temperature source Oral, resp. rate 18, height 5\' 8"  (1.727 m), weight 106 kg, SpO2 92 %. ? ? ? ? ? ?General: No acute distress ?Mood and affect are appropriate ?Heart: Regular rate and rhythm no rubs murmurs or extra sounds ?Lungs: Clear to auscultation, breathing unlabored, no rales or wheezes ?Abdomen: Positive bowel sounds, soft nontender to palpation, nondistended ?Extremities: No clubbing, cyanosis, or edema ?Skin: No evidence of breakdown, no evidence of rash ? ? 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  Nods and answers yes to most questions.  Does follow simple commands more than 50% of the time. Uses facial gestures to communicate also ? ?Neurologic: Cranial nerves II through XII intact, motor strength is 5/5 in left and 0/5 RIght deltoid, bicep, tricep, grip, hip flexor, knee extensors, ankle dorsiflexor and plantar flexor ?Trace Right hip add  ?  ?Musculoskeletal: Full range of motion in all 4 extremities. No joint swelling ? ? ? ?Assessment/Plan: ?1. Functional deficits which require 3+ hours per day of interdisciplinary therapy in a comprehensive inpatient rehab setting. ?Physiatrist is providing close team supervision and 24 hour management of  active medical problems listed below. ?Physiatrist and rehab team continue to assess barriers to discharge/monitor patient progress toward functional and medical goals ? ?Care Tool: ? ?Bathing ?   ?Body parts bathed by patient: Right arm, Front perineal area, Left upper leg, Right upper leg, Chest, Abdomen, Face  ? Body parts bathed by helper: Left arm, Right lower leg, Left lower leg ?  ?  ?Bathing assist Assist Level: Moderate Assistance - Patient 50 - 74% ?  ?  ?Upper Body Dressing/Undressing ?Upper body dressing   ?What is the patient wearing?: Pull over shirt ?   ?Upper body assist Assist Level: Moderate Assistance - Patient 50 - 74% ?   ?Lower Body Dressing/Undressing ?Lower body dressing ? ? ?   ?What is the patient wearing?: Incontinence brief, Pants ? ?  ? ?Lower body assist Assist for lower body dressing: Maximal Assistance - Patient 25 - 49% ?   ? ?Toileting ?Toileting Toileting Activity did not occur (Probation officer and hygiene only): N/A (no void or bm)  ?Toileting assist Assist for toileting: Dependent - Patient 0% ?  ?  ?Transfers ?Chair/bed transfer ? ?Transfers assist ?   ? ?Chair/bed transfer assist level: Moderate Assistance -  Patient 50 - 74% (squat pivot) ?  ?  ?Locomotion ?Ambulation ? ? ?Ambulation assist ? ? Ambulation activity did not occur: Safety/medical concerns ? ?Assist level: 2 helpers ?Assistive device: Lite Gait ?Max distance: 171ft  ? ?Walk 10 feet activity ? ? ?Assist ? Walk 10 feet activity did not occur: Safety/medical concerns ? ?  ?   ? ?Walk 50 feet activity ? ? ?Assist Walk 50 feet with 2 turns activity did not occur: Safety/medical concerns ? ?  ?   ? ? ?Walk 150 feet activity ? ? ?Assist Walk 150 feet activity did not occur: Safety/medical concerns ? ?  ?  ?  ? ?Walk 10 feet on uneven surface  ?activity ? ? ?Assist Walk 10 feet on uneven surfaces activity did not occur: Safety/medical concerns ? ? ?  ?   ? ?Wheelchair ? ? ? ? ?Assist Is the patient using a wheelchair?:  Yes (Per PT long-term goals) ?Type of Wheelchair: Manual ?Wheelchair activity did not occur: Safety/medical concerns ? ?  ?   ? ? ?Wheelchair 50 feet with 2 turns activity ? ? ? ?Assist ? ?  ?Wheelchair 50 feet with 2 turns activity did not occur: Safety/medical concerns ? ? ?   ? ?Wheelchair 150 feet activity  ? ? ? ?Assist ? Wheelchair 150 feet activity did not occur: Safety/medical concerns ? ? ?   ? ?Blood pressure 138/75, pulse 90, temperature 97.9 ?F (36.6 ?C), temperature source Oral, resp. rate 18, height 5\' 8"  (1.727 m), weight 106 kg, SpO2 92 %. ? ?Medical Problem List and Plan: ?1. 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            -patient may not shower ?            -ELOS/Goals: 5/19 min/mod A ?         -Continue CIR therapies including PT, OT, and SLP  ?2.  Antithrombotics: ?-DVT/anticoagulation:  Pharmaceutical: Lovenox added as 5 days out from [facility], 30mg  q12 ?5/7 dopplers + for right gastroc/peroneal dvt's- continue current lovenox. Re-image leg in about a week, ACTIVITY AS TOLERATED- ?            -antiplatelet therapy:  on ASA/Brillinta ?3. Pain Management: Tylenol prn.  ?4. Mood: LCSW to follow for evaluation and support when appropriate.  ?            -antipsychotic agents: N/A ? -melatonin at night seems to have been effective with sleep ?5. Neuropsych: This patient is not capable of making decisions on his own behalf. ?6. Skin/Wound Care: Routine pressure relief measures.  ?7. Fluids/Electrolytes/Nutrition: Following labs as well as ins and outs ?8. Hyponatremia: Stable. Monitor with serial checks.  ?9. Prediabetes: Hgb A1C-6.0. fair control  ?-CBG (last 3)  ?Recent Labs  ?  03/05/22 ?1543 03/07/22 ?1154 03/07/22 ?1723  ?GLUCAP 148* 119* 126*  ? ?Stopped CBGs due to DC TF  ?10. Dysphagia: D3 thin liq  ?          eating 75-100% meals  ?11. Leucocytosis: resolved  ?         ? ?  Latest Ref Rng & Units 03/08/2022  ?  5:28 AM 03/05/2022  ?  5:56 PM 03/01/2022  ?  6:01 AM  ?CBC  ?WBC 4.0 - 10.5 K/uL  5.7   15.4   6.8    ?Hemoglobin 13.0 - 17.0 g/dL 14.5   15.6   15.2    ?Hematocrit 39.0 - 52.0 %  41.7   44.4   43.0    ?Platelets 150 - 400 K/uL 184   205   239    ?  ?12. Elevated LDL: continue Lipitor. ?13.  AKI: resolved  ? 5/1- Cr 1.08 and BUN doing well at 18= con't to monitor ? ?  Latest Ref Rng & Units 03/08/2022  ?  5:28 AM 03/01/2022  ?  6:01 AM 02/22/2022  ?  6:09 AM  ?BMP  ?Glucose 70 - 99 mg/dL 125   112   142    ?BUN 6 - 20 mg/dL 12   18   25     ?Creatinine 0.61 - 1.24 mg/dL 1.04   1.08   1.06    ?Sodium 135 - 145 mmol/L 135   137   137    ?Potassium 3.5 - 5.1 mmol/L 3.7   3.7   4.1    ?Chloride 98 - 111 mmol/L 106   105   106    ?CO2 22 - 32 mmol/L 23   24   24     ?Calcium 8.9 - 10.3 mg/dL 8.9   8.8   8.5    ? 5/7 continue to encourage p.o. intake ? ?14. Hypocalcemia:  Ionized Ca- 1.11 with normal albumin-4.1.  ?--Add calcium supplement bid.  ?15. Elevated Homocysteine levels:  B12, folate WNL--per Dr. Erlinda Hong , neuro, who felt no treatment needed as less than 50.  ?16. Obesity: BMI 35.53- provide dietary education. ? ?  ? ?LOS: ?18 days ?A FACE TO FACE EVALUATION WAS PERFORMED ? ?Luanna Salk Zaineb Nowaczyk ?03/08/2022, 8:25 AM  ? ? ? ?

## 2022-03-08 NOTE — Progress Notes (Signed)
Physical Therapy Weekly Progress Note ? ?Patient Details  ?Name: Gordon Fisher ?MRN: [id number] ?Date of Birth: 1961/02/03 ? ?Beginning of progress report period: February 27, 2022 ?End of progress report period: Mar 08, 2022 ? ?Today's Date: 03/08/2022 ?PT Individual Time: 0093-8182 ?PT Individual Time Calculation (min): 73 min  ? ?Patient has met 2 of 5 short term goals.  Gordon Fisher is continuing to making steady progress with therapy, this reporting period. Overall, he requires modA for bed mobility, supervision for unsupported static sitting balance, modA for squat<>pivot transfers and sit<>stand transfers, and is ambulating variable distances ranging from ~20-141f requiring +2 mod/maxA for balance and RLE gait mechanics. He continues to be primarily limited by R sided weakness (RUE > RLE), pusher's syndrome, expressive > receptive aphasia, R inattention, and motor apraxia. Anticipate pt will require custom wheelchair for pressure relief, functional independence, and to reduce caregiver burden.  ? ?Patient continues to demonstrate the following deficits muscle weakness and muscle joint tightness, decreased cardiorespiratoy endurance, impaired timing and sequencing, unbalanced muscle activation, motor apraxia, and decreased motor planning, decreased midline orientation, decreased attention to right, and decreased motor planning, decreased attention, decreased awareness, decreased problem solving, decreased safety awareness, and decreased memory, and decreased sitting balance, decreased standing balance, decreased postural control, hemiplegia, and decreased balance strategies and therefore will continue to benefit from skilled PT intervention to increase functional independence with mobility. ? ?Patient progressing toward long term goals..  Continue plan of care. ? ?PT Short Term Goals ?Week 2:  PT Short Term Goal 1 (Week 2): Pt will perform supine<>sit with min assist ?PT Short Term Goal 1 - Progress (Week 2): Not  met ?PT Short Term Goal 2 (Week 2): Pt will perform sit<>stands using LRAD with mod assist  of 1 ?PT Short Term Goal 2 - Progress (Week 2): Met ?PT Short Term Goal 3 (Week 2): Pt will perform bed<>chair transfers using LRAD wtih mod assist of 1 ?PT Short Term Goal 3 - Progress (Week 2): Met ?PT Short Term Goal 4 (Week 2): Pt will ambulate at least 271fusing LRAD with max assist of 1 and +2 min assist ?PT Short Term Goal 4 - Progress (Week 2): Not met ?PT Short Term Goal 5 (Week 2): Pt will participate in BeWestonPT Short Term Goal 5 - Progress (Week 2): Not met ?Week 3:  PT Short Term Goal 1 (Week 3): Pt will complete bed mobility with minA ?PT Short Term Goal 2 (Week 3): Pt will complete bed<>chair transfers with minA and LRAD ?PT Short Term Goal 3 (Week 3): Pt will ambulate 2567fith modA of 1 person ?PT Short Term Goal 4 (Week 3): Pt will be evaluated for custom wheelchair to optimize functional indepdence and safety ? ?Skilled Therapeutic Interventions/Progress Updates:  ?   ?Pt presenting sitting in TIS w/c, appears agreeable to PT tx with aphasia limiting. Transported in w/c to day room rehab gym to focus session on gait training using LiteGait BWS over Treadmill to optimize distance and time ambulating.  ? ?Donned/doffed harness sitting for safety and ace wrapped RLE for DF assist. Donned leg lifter as well to assist with facilitating gait mechanics and ace wrapped his paretic RUE to handle to promote weight bearing and limit distractions. Sit<>stand from w/c with minA to LitFifeequired +2 maxA for stepping up on the treadmill with motor apraxia and pushing limiting most. Applied moderate BWS through harness. A few trials were performed on treadmill however distances were <[id number]  to STRONG pushing with his L side. Deferred gait over treadmill for safety concerns. +2 maxA required for safely stepping off of treadmill backwards.  ? ?Wheeled to main rehab hallway. Gait training using +2  maxA via 3-muskateers - ambulating 41f + 861fwith seated rest breaks. PT facilitating RLE swing phase with some quad activation noted in stance. Decreased stance time on R with inconsistent step lengths on L. Pushing to his paretic R side as well. Instructed in side stepping 3572fsing hand rail in front and maxA of 1 person with +2 on standby for safety. Side stepping to his R to promote weight shifting and then side stepping L to reduce pushing.  ? ?Pt noted to be incontinent during this time. Returned to his room and assist to bed via modA squat<>pivot transfer. Sit>supine with minA for RLE management. Pt able to bridge to remove brief and pericare provided. Brief change at bed level. Unfortunately, while PT was retrieving brief from utility closet, pt incontinent of further bladder requiring linen change. New clean brief applied after pt cleaned.  ? ?Concluded session supine in bed with pillow suporting RUE, all needs met, bed alarm on, soft call bell in reach.  ? ?Therapy Documentation ?Precautions:  ?Precautions ?Precautions: Fall, Other (comment) ?Precaution Comments: R hemi, global aphasia, cortrak ?Restrictions ?Weight Bearing Restrictions: No ?General: ?  ? ?Therapy/Group: Individual Therapy ? ?ChrAlger Simons ?03/08/2022, 7:26 AM  ?

## 2022-03-08 NOTE — Progress Notes (Signed)
Occupational Therapy Session Note ? ?Patient Details  ?Name: Gordon Fisher ?MRN: [id number] ?Date of Birth: 1961/09/09 ? ?Today's Date: 03/08/2022 ?OT Individual Time: [id number] ?OT Individual Time Calculation (min): 60 min  ? ? ?Short Term Goals: ?Week 3:  OT Short Term Goal 1 (Week 3): pt will complete sit>stand with MIN A+1 wih LRAD ?OT Short Term Goal 2 (Week 3): pt will complete UB bathing with MINA ?OT Short Term Goal 3 (Week 3): pt will complete LBbathing with MOD A +1 ? ?Skilled Therapeutic Interventions/Progress Updates:  ? Pt greeted supine in bed, nodding "yes" and     agreeable to OT intervention. Session focus on BADL reeducation, functional mobility, dynamic standing balance, use of electrical stimulation to promote RUE AROM and decreasing overall caregiver burden.                    ?Pt completed Bathing and dressing from shower level as indicated below ( assist levels). 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 Pt also benefits from completing task in similar environment d/t apraxia. Of note pt did state new word today with pt stating one word of profanity out of frustration when donning pants from EOB.  ?Remainer of session focused on estim to RUE.  ?1:1 NMES applied to RUE flexors at the below settings:  ?  ?Ratio 1:3 ?Rate 35 pps ?Waveform- Asymmetric ?Ramp 1.0 ?Pulse 300 ?Intensity- 30 ?Duration -   8 mins ? ?Not adverse reactions noted post session, focused on squeezing foam when stimulus activated with pt noted to have active thumb movement.  ?Pt left supine in bed with safety belt activated and all needs within reach.  ? ?         ?Therapy Documentation ?Precautions:  ?Precautions ?Precautions: Fall, Other (comment) ?Precaution Comments: R hemi, global aphasia, cortrak ?Restrictions ?Weight Bearing Restrictions: No ? ?ADL: ?ADL ? ?Upper Body Bathing: Moderate assistance ?Where Assessed-Upper Body Bathing: Shower ?Lower Body  Bathing: Moderate assistance ?Where Assessed-Lower Body Bathing: Shower ?Upper Body Dressing: Moderate assistance ?Where Assessed-Upper Body Dressing: Edge of bed ?Lower Body Dressing: Other (Comment) (MOD A +2) ?Where Assessed-Lower Body Dressing: Edge of bed ? ?Pain: grimaces to pain in RLE, rest breaks and repositioning provided as needed.  ? ? ? ?Therapy/Group: Individual Therapy ? ?[name] ?03/08/2022, 12:24 PM ?

## 2022-03-09 ENCOUNTER — Inpatient Hospital Stay (HOSPITAL_COMMUNITY): Payer: 59

## 2022-03-09 LAB — GLUCOSE, CAPILLARY: Glucose-Capillary: 112 mg/dL — ABNORMAL HIGH (ref 70–99)

## 2022-03-09 NOTE — Progress Notes (Signed)
?                                                       PROGRESS NOTE ? ? ?Subjective/Complaints: ? ?Aphasic smiling , Y/N response >50% accurate  ? ?ROS: limited due to language/communication  ? ?Objective: ?  ?No results found. ?Recent Labs  ?  03/08/22 ?[id number]  ?WBC 5.7  ?HGB 14.5  ?HCT 41.7  ?PLT 184  ? ? ?Recent Labs  ?  03/08/22 ?[id number]  ?NA 135  ?K 3.7  ?CL 106  ?CO2 23  ?GLUCOSE 125*  ?BUN 12  ?CREATININE 1.04  ?CALCIUM 8.9  ? ? ? ? ?Intake/Output Summary (Last 24 hours) at 03/09/2022 0804 ?Last data filed at 03/09/2022 0500 ?Gross per 24 hour  ?Intake 535 ml  ?Output 300 ml  ?Net 235 ml  ? ?  ? ?  ? ?Physical Exam: ?Vital Signs ?Blood pressure 123/83, pulse 78, temperature 97.6 ?F (36.4 ?C), resp. rate 14, height 5\' 8"  (1.727 m), weight 106 kg, SpO2 96 %. ? ? ? ? ? ?General: No acute distress ?Mood and affect are appropriate ?Heart: Regular rate and rhythm no rubs murmurs or extra sounds ?Lungs: Clear to auscultation, breathing unlabored, no rales or wheezes ?Abdomen: Positive bowel sounds, soft nontender to palpation, nondistended ?Extremities: No clubbing, cyanosis, or edema ?Skin: No evidence of breakdown, no evidence of rash ? ? 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  Nods and answers yes to most questions.  Does follow simple commands more than 50% of the time. Uses facial gestures to communicate also ? ?Neurologic: Cranial nerves II through XII intact, motor strength is 5/5 in left and 0/5 RIght deltoid, bicep, tricep, grip, hip flexor, knee extensors, ankle dorsiflexor and plantar flexor ?Trace Right hip add  ?  ?Musculoskeletal: Full range of motion in all 4 extremities. No joint swelling ? ? ? ?Assessment/Plan: ?1. Functional deficits which require 3+ hours per day of interdisciplinary therapy in a comprehensive inpatient rehab setting. ?Physiatrist is providing close team supervision and 24 hour management of active medical problems listed  below. ?Physiatrist and rehab team continue to assess barriers to discharge/monitor patient progress toward functional and medical goals ? ?Care Tool: ? ?Bathing ?   ?Body parts bathed by patient: Right arm, Front perineal area, Left upper leg, Right upper leg, Chest, Abdomen, Face  ? Body parts bathed by helper: Left arm, Right lower leg, Left lower leg ?  ?  ?Bathing assist Assist Level: Moderate Assistance - Patient 50 - 74% ?  ?  ?Upper Body Dressing/Undressing ?Upper body dressing   ?What is the patient wearing?: Pull over shirt ?   ?Upper body assist Assist Level: Moderate Assistance - Patient 50 - 74% ?   ?Lower Body Dressing/Undressing ?Lower body dressing ? ? ?   ?What is the patient wearing?: Incontinence brief, Pants ? ?  ? ?Lower body assist Assist for lower body dressing: 2 Helpers (MOD A +2) ?   ? ?Toileting ?Toileting Toileting Activity did not occur (Probation officer and hygiene only): N/A (no void or bm)  ?Toileting assist Assist for toileting: Dependent - Patient 0% ?  ?  ?Transfers ?Chair/bed transfer ? ?Transfers assist ?   ? ?Chair/bed transfer assist level: Moderate Assistance - Patient 50 - 74% ?  ?  ?  Locomotion ?Ambulation ? ? ?Ambulation assist ? ? Ambulation activity did not occur: Safety/medical concerns ? ?Assist level: 2 helpers ?Assistive device: Lite Gait ?Max distance: 161ft  ? ?Walk 10 feet activity ? ? ?Assist ? Walk 10 feet activity did not occur: Safety/medical concerns ? ?  ?   ? ?Walk 50 feet activity ? ? ?Assist Walk 50 feet with 2 turns activity did not occur: Safety/medical concerns ? ?  ?   ? ? ?Walk 150 feet activity ? ? ?Assist Walk 150 feet activity did not occur: Safety/medical concerns ? ?  ?  ?  ? ?Walk 10 feet on uneven surface  ?activity ? ? ?Assist Walk 10 feet on uneven surfaces activity did not occur: Safety/medical concerns ? ? ?  ?   ? ?Wheelchair ? ? ? ? ?Assist Is the patient using a wheelchair?: Yes (Per PT long-term goals) ?Type of Wheelchair:  Manual ?Wheelchair activity did not occur: Safety/medical concerns ? ?  ?   ? ? ?Wheelchair 50 feet with 2 turns activity ? ? ? ?Assist ? ?  ?Wheelchair 50 feet with 2 turns activity did not occur: Safety/medical concerns ? ? ?   ? ?Wheelchair 150 feet activity  ? ? ? ?Assist ? Wheelchair 150 feet activity did not occur: Safety/medical concerns ? ? ?   ? ?Blood pressure 123/83, pulse 78, temperature 97.6 ?F (36.4 ?C), resp. rate 14, height 5\' 8"  (1.727 m), weight 106 kg, SpO2 96 %. ? ?Medical Problem List and Plan: ?1. 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            -patient may not shower ?            -ELOS/Goals: 5/19 min/mod A ?         -Continue CIR therapies including PT, OT, and SLP - team conf in am  ?2.  Antithrombotics: ?-DVT/anticoagulation:  Pharmaceutical: Lovenox added as 5 days out from [facility], 30mg  q12 ?5/7 dopplers + for right gastroc/peroneal dvt's- continue current lovenox. Re-image leg tomorrow ACTIVITY AS TOLERATED- ?            -antiplatelet therapy:  on ASA/Brillinta ?3. Pain Management: Tylenol prn.  ?4. Mood: LCSW to follow for evaluation and support when appropriate.  ?            -antipsychotic agents: N/A ? -melatonin at night seems to have been effective with sleep ?5. Neuropsych: This patient is not capable of making decisions on his own behalf. ?6. Skin/Wound Care: Routine pressure relief measures.  ?7. Fluids/Electrolytes/Nutrition: Following labs as well as ins and outs ?8. Hyponatremia: Stable. Monitor with serial checks.  ?9. Prediabetes: Hgb A1C-6.0. fair control  ?-CBG (last 3)  ?Recent Labs  ?  03/07/22 ?1723 03/08/22 ?1147 03/09/22 ?0559  ?GLUCAP 126* 110* 112*  ? ?Stopped CBGs due to DC TF  ?10. Dysphagia: D3 thin liq  ?          eating 75-100% meals  ?11. Leucocytosis: resolved  ?         ? ?  Latest Ref Rng & Units 03/08/2022  ?  5:28 AM 03/05/2022  ?  5:56 PM 03/01/2022  ?  6:01 AM  ?CBC  ?WBC 4.0 - 10.5 K/uL 5.7   15.4   6.8    ?Hemoglobin 13.0 - 17.0 g/dL 14.5   15.6    15.2    ?Hematocrit 39.0 - 52.0 % 41.7   44.4   43.0    ?Platelets  150 - 400 K/uL 184   205   239    ?  ?12. Elevated LDL: continue Lipitor. ?13.  AKI: resolved  ? 5/1- Cr 1.08 and BUN doing well at 18= con't to monitor ? ?  Latest Ref Rng & Units 03/08/2022  ?  5:28 AM 03/01/2022  ?  6:01 AM 02/22/2022  ?  6:09 AM  ?BMP  ?Glucose 70 - 99 mg/dL 125   112   142    ?BUN 6 - 20 mg/dL 12   18   25     ?Creatinine 0.61 - 1.24 mg/dL 1.04   1.08   1.06    ?Sodium 135 - 145 mmol/L 135   137   137    ?Potassium 3.5 - 5.1 mmol/L 3.7   3.7   4.1    ?Chloride 98 - 111 mmol/L 106   105   106    ?CO2 22 - 32 mmol/L 23   24   24     ?Calcium 8.9 - 10.3 mg/dL 8.9   8.8   8.5    ? 5/7 continue to encourage p.o. intake ? ?14. Hypocalcemia:  Ionized Ca- 1.11 with normal albumin-4.1.  ?--Add calcium supplement bid.  ?15. Elevated Homocysteine levels:  B12, folate WNL--per Dr. Erlinda Hong , neuro, who felt no treatment needed as less than 50.  ?16. Obesity: BMI 35.53- provide dietary education. ? ?  ? ?LOS: ?19 days ?A FACE TO FACE EVALUATION WAS PERFORMED ? ?Luanna Salk Ronnae Kaser ?03/09/2022, 8:04 AM  ? ? ? ?

## 2022-03-09 NOTE — Progress Notes (Addendum)
Occupational Therapy Session Note ? ?Patient Details  ?Name: Gordon Fisher ?MRN: [id number] ?Date of Birth: August 21, 1961 ? ?Today's Date: 03/09/2022 ?OT Individual Time: 5809-9833 ?OT Individual Time Calculation (min): 67 min  ? ? ?Short Term Goals: ?Week 3:  OT Short Term Goal 1 (Week 3): pt will complete sit>stand with MIN A+1 wih LRAD ?OT Short Term Goal 2 (Week 3): pt will complete UB bathing with MINA ?OT Short Term Goal 3 (Week 3): pt will complete LBbathing with MOD A +1 ? ?Skilled Therapeutic Interventions/Progress Updates:  ?  Patient received supine in bed, indicated willingness to get out of bed and need to use restroom.  This session focused on skilled OT intervention to address postural control during simple self care tasks.  Patient has shown tremendous improvement in trunk control in both sitting and standing balance. Patient with improved stand tolerance and confidence - now able to assist with pulling up LB clothing.  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   ?Patient taken to gym to further address level surface transfers, balance, and neuromuscular reeducation of RUE.  Patient able to maintain weight shift to right side, and showing improved active movement in RUE -  adduction, flexion/extension of shoulder in sitting and in standing on mobile surface.   ?Patient returned to room- safety belt engaged and touch pad and regular call bell in reach.   ? ?Therapy Documentation ?Precautions:  ?Precautions ?Precautions: Fall, Other (comment) ?Precaution Comments: R hemi, global aphasia, cortrak ?Restrictions ?Weight Bearing Restrictions: No ? ?  ?Pain: ? Reports no pain when asked.  Did grimace at times with right wrist position - resolved with reposition ? ? ? ? ? ?Therapy/Group: Individual Therapy ? ?[name] ?03/09/2022, 3:24 PM ?

## 2022-03-09 NOTE — Progress Notes (Signed)
Speech Language Pathology Daily Session Note ? ?Patient Details  ?Name: Gordon Fisher ?MRN: [id number] ?Date of Birth: [birth year] ? ?Today's Date: 03/09/2022 ?SLP Individual Time: 0800-0900 ?SLP Individual Time Calculation (min): 60 min ? ?Short Term Goals: ?Week 3: SLP Short Term Goal 1 (Week 3): Patient will consume current diet with minimal s/s of aspiration with mod I to monitor anterior and clear spillage ?SLP Short Term Goal 2 (Week 3): Patient will clarify responses to yes/no questions via multimodal means using gestures (thumbs up/down) or communication board during 50% of opportunities with sup A verbal cues ?SLP Short Term Goal 3 (Week 3): Patient will answer complex yes/no questions with mod-to-max A to achieve 50% accuracy ?SLP Short Term Goal 4 (Week 3): Patient will identify the appropriate object/picture following a verbal description of 2-3 clues with 70% accuracy given mod A verbal cues ?SLP Short Term Goal 5 (Week 3): Patient will approximate written responses to object naming tasks by writing initial 1-2 letters or fill-in-the blank cueing with max A ?SLP Short Term Goal 6 (Week 3): Pt will approximate/imitate at the sound level achieve 75% accuracy with max A multimodal cues. ? ?Skilled Therapeutic Interventions: Skilled ST treatment focused on language goals. Patient was upright in bed, alert, and cheery today. Pt verbalized needs through "yeah" and "yep" verbal responses, head nod/shake, and gestures with 70% clarity with sup A progressing to 90% clarity given min A verbal repetition. SLP facilitated session by providing min fading to sup A verbal cues for identifying field of 2 pictures based on semantic features with 85% accuracy. Pt identified items with 75% accuracy when given 2-3 semantic features in field of 3 pictures with min A verbal cues. Pt identified opposites in field of 2 pictures with 85% accuracy with sup A verbal cues. For verbalization tasks, pt produced the following sounds in  isolation with mod A verbal/visual cues: "ah", "ooo", "oh", "eee", /m/, /l/, /k/; max A multimodal cues to produce /p/; pt unable to approximate /g/, /d/, /n/. Pt began to perseverate initial phoneme /l/ with all subsequent verbalizations. For example, for attempts to approximate his name "[name]" pt initially verbalized "luck" but was able to correct to "[facility]"; continues to exhibit difficulty with /ai/ vowel sound thus production of his name continues to be distorted. Patient was left in bed with alarm activated and immediate needs within reach at end of session. Continue per current plan of care.   ?   ?Pain ?Pain Assessment ?Pain Scale: 0-10 ?Pain Score: 0-No pain ? ?Therapy/Group: Individual Therapy ? ?Maigan Bittinger T Laquandra Carrillo ?03/09/2022, 9:03 AM ?

## 2022-03-09 NOTE — Progress Notes (Signed)
Physical Therapy Session Note ? ?Patient Details  ?Name: Gordon Fisher ?MRN: [id number] ?Date of Birth: 1960-12-11 ? ?Today's Date: 03/09/2022 ?PT Individual Time: [id number] ?PT Individual Time Calculation (min): 73 min  ? ?Short Term Goals: ?Week 3:  PT Short Term Goal 1 (Week 3): Pt will complete bed mobility with minA ?PT Short Term Goal 2 (Week 3): Pt will complete bed<>chair transfers with minA and LRAD ?PT Short Term Goal 3 (Week 3): Pt will ambulate 2ft with modA of 1 person ?PT Short Term Goal 4 (Week 3): Pt will be evaluated for custom wheelchair to optimize functional indepdence and safety ? ?Skilled Therapeutic Interventions/Progress Updates:  ?  Pt received sitting in TIS w/c with his wife, [name], present and pt agreeable to therapy session with yes head nod. Pt continues to have limited verbal output, using yes/no head nods and thumbs up/down to communicate; however, did appropriately respond with "OK" while ambulating. Therapist discussed plan for custom wheelchair consultation on Thursday 5/11 at 11:00AM and pt's wife planning to attend. Reviewed home measurement sheet with pt having 28in wide door frames to bedroom and bathroom as well as a bed height of 30.5inches - discussed that pt will not be able to safely perform squat pivot transfers to a bed of that height and pt may require hospital bed to allow increased independence with bed mobility while also decreasing burden of care on pt's wife. ? ?Pt unable to tolerate AFO on R LE due to recent DVT, adamantly nodding head "no" when therapist held it up. ? ?Pt reports need to use bathroom via pointing towards bathroom. Pt noted to be soiled in urine through brief and clothing and up his shirt - therapist requested nursing staff assist pt to toilet every 2-3 hours to prevent episodes of incontinence due to pt aphasia limiting his ability to notify them for toileting needs, nurse in agreement. ? ?R squat pivot transfer TIS w/c>bariatric BSC over toilet  with light mod/heavy min assist for balance and total assist to manage R LE positioning. Sit<>stands to/from [facility] using L UE support on grab bar with light min assist for balance while rising and for supporting R UE. In standing, requires mod assist for balance due to strong R anterior trunk lean during max assist for LB clothing management. In sitting, doffed shirt with max assist for cleanliness. Sitting on BSC, performed hygiene with assist for cleanliness due to being soiled. Pt continent of bowels. Standing with L UE support on grab bar and continued heavy mod assist for balance due to anterior lean during dependent assist posterior peri-care. R squat pivot back to TIS w/c with light mod assist for rotating hips - continues to require total assist to manage RLE positioning/placement. ? ? Transported to/from gym in w/c for time management and energy conservation. ? ?Sit<>stands to/from TIS w/c with CGA while rising to stand but as soon as pt attempts to move he requires up to heavy mod assist for balance due to R anterior lean. ? ?Donned R LE DF assist ACE wrap (since unable to tolerate AFO) and leg lifter to facilitate swing phase gait mechanics. ? ?Gait training 155ft + 13ft + 32ft + 43ft using 3 Musketeer support and +2 mod assist for balance and R LE management with the following skilled cuing and manual facilitation (3rd person for wheelchair management): ?- continues to have strong R anterior trunk lean immediately following L limb advancement during L stance while attempting to advance R LE during swing - with  multimodal cuing and increased repetition pt with significant improvement in motor plan to maintain L posterior pelvic weight shift, which actually allows pt to advance R LE ~10-50% of the way during swing - pt frequently will still try to hyperextend as his back as opposed to shifting his hips/pelvis back towards the L requiring multimodal cuing to correct ?-- pt responds best to tactile cuing at L  glute to weight shift in that direction ?- continues to require total assist to advance and positioning R LE during swing advancement  ?- pt now demonstrates ability to maintain R knee stability during stance without guarding and occasionally will have hyperextension ? ?At end of session, pt left seated in TIS w/c with needs in reach, R UE supported on pillow, seat belt alarm on, and pt's wife present. ? ? ?Therapy Documentation ?Precautions:  ?Precautions ?Precautions: Fall, Other (comment) ?Precaution Comments: R hemi, global aphasia, cortrak ?Restrictions ?Weight Bearing Restrictions: No ? ? ?Pain: ? Pt grimaces to touch of R calf region, but otherwise no indication of pain during session. ? ? ?Therapy/Group: Individual Therapy ? ?Tawana Scale , PT, DPT, NCS, CSRS ?03/09/2022, 3:25 PM  ?

## 2022-03-10 ENCOUNTER — Inpatient Hospital Stay (HOSPITAL_COMMUNITY): Payer: 59

## 2022-03-10 DIAGNOSIS — I82401 Acute embolism and thrombosis of unspecified deep veins of right lower extremity: Secondary | ICD-10-CM

## 2022-03-10 LAB — CULTURE, BLOOD (ROUTINE X 2)
Culture: NO GROWTH
Culture: NO GROWTH
Special Requests: ADEQUATE
Special Requests: ADEQUATE

## 2022-03-10 NOTE — Progress Notes (Signed)
?                                                       PROGRESS NOTE ? ? ?Subjective/Complaints: ?Aphasic in bed appears comfortable  ? ?ROS: limited due to language/communication  ? ?Objective: ?  ?No results found. ?Recent Labs  ?  03/08/22 ?6759  ?WBC 5.7  ?HGB 14.5  ?HCT 41.7  ?PLT 184  ? ? ?Recent Labs  ?  03/08/22 ?1638  ?NA 135  ?K 3.7  ?CL 106  ?CO2 23  ?GLUCOSE 125*  ?BUN 12  ?CREATININE 1.04  ?CALCIUM 8.9  ? ? ? ? ?Intake/Output Summary (Last 24 hours) at 03/10/2022 0909 ?Last data filed at 03/10/2022 4665 ?Gross per 24 hour  ?Intake 500 ml  ?Output 800 ml  ?Net -300 ml  ? ?  ? ?  ? ?Physical Exam: ?Vital Signs ?Blood pressure 129/84, pulse 71, temperature 98.2 ?F (36.8 ?C), resp. rate 18, height 5' 8"  (1.727 m), weight 106 kg, SpO2 96 %. ? 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  Nods and answers yes to most questions.  Does follow simple commands more than 50% of the time. Uses facial gestures to communicate also ? ?Neurologic: Cranial nerves II through XII intact, motor strength is 5/5 in left and 0/5 RIght deltoid, bicep, tricep, grip, hip flexor, knee extensors, ankle dorsiflexor and plantar flexor ?Trace Right hip add  ?  ?Musculoskeletal: Full range of motion in all 4 extremities. No joint swelling ? ? ? ?Assessment/Plan: ?1. Functional deficits which require 3+ hours per day of interdisciplinary therapy in a comprehensive inpatient rehab setting. ?Physiatrist is providing close team supervision and 24 hour management of active medical problems listed below. ?Physiatrist and rehab team continue to assess barriers to discharge/monitor patient  progress toward functional and medical goals ? ?Care Tool: ? ?Bathing ?   ?Body parts bathed by patient: Face, Chest, Abdomen, Right arm  ? Body parts bathed by helper: Left arm ?  ?  ?Bathing assist Assist Level: Minimal Assistance - Patient > 75% ?  ?  ?Upper Body Dressing/Undressing ?Upper body dressing   ?What is the patient wearing?: Pull over shirt ?   ?Upper body assist Assist Level: Moderate Assistance - Patient 50 - 74% ?   ?Lower Body Dressing/Undressing ?Lower body dressing ? ? ?   ?What is the patient wearing?: Incontinence brief, Pants ? ?  ? ?Lower body assist Assist for lower body dressing: Moderate Assistance - Patient 50 - 74% ?   ? ?Toileting ?Toileting Toileting Activity did not occur (Probation officer and hygiene only): N/A (no void or bm)  ?Toileting assist Assist for toileting: Moderate Assistance - Patient 50 - 74% ?  ?  ?Transfers ?Chair/bed transfer ? ?Transfers assist ?   ? ?Chair/bed transfer assist level: Moderate Assistance - Patient 50 - 74% ?  ?  ?Locomotion ?Ambulation ? ? ?Ambulation assist ? ? Ambulation activity did not occur: Safety/medical concerns ? ?Assist level: 2 helpers (+2 mod A) ?Assistive device: Other (comment) (3  Musketeer) ?Max distance: 116f  ? ?Walk 10 feet activity ? ? ?Assist ? Walk 10 feet activity did not occur: Safety/medical concerns ? ?  ?   ? ?Walk 50 feet activity ? ? ?Assist Walk 50 feet with 2 turns activity did not occur: Safety/medical concerns ? ?  ?   ? ? ?Walk 150 feet activity ? ? ?Assist Walk 150 feet activity did not occur: Safety/medical concerns ? ?  ?  ?  ? ?Walk 10 feet on uneven surface  ?activity ? ? ?Assist Walk 10 feet on uneven surfaces activity did not occur: Safety/medical concerns ? ? ?  ?   ? ?Wheelchair ? ? ? ? ?Assist Is the patient using a wheelchair?: Yes (Per PT long-term goals) ?Type of Wheelchair: Manual ?Wheelchair activity did not occur: Safety/medical concerns ? ?  ?   ? ? ?Wheelchair 50 feet with 2 turns  activity ? ? ? ?Assist ? ?  ?Wheelchair 50 feet with 2 turns activity did not occur: Safety/medical concerns ? ? ?   ? ?Wheelchair 150 feet activity  ? ? ? ?Assist ? Wheelchair 150 feet activity did not occur: Safety/medical concerns ? ? ?   ? ?Blood pressure 129/84, pulse 71, temperature 98.2 ?F (36.8 ?C), resp. rate 18, height 5' 8"  (1.727 m), weight 106 kg, SpO2 96 %. ? ?Medical Problem List and Plan: ?1. 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            -patient may not shower ?            -ELOS/Goals: 5/19 min/mod A ?         -Continue CIR therapies including PT, OT, and SLP - Team conference today please see physician documentation under team conference tab, met with team  to discuss problems,progress, and goals. Formulized individual treatment plan based on medical history, underlying problem and comorbidities. ? ?2.  Antithrombotics: ?-DVT/anticoagulation:  Pharmaceutical: [id number] ?5/7 dopplers + for right gastroc/peroneal dvt's- continue current lovenox. Re-image leg 5/12- if there is progressin to popliteal will need DOAC if approved by neurology  ACTIVITY AS TOLERATED- ?            -antiplatelet therapy:  on ASA/Brillinta ?3. Pain Management: Tylenol prn.  ?4. Mood: LCSW to follow for evaluation and support when appropriate.  ?            -antipsychotic agents: N/A ? -melatonin at night seems to have been effective with sleep ?5. Neuropsych: This patient is not capable of making decisions on his own behalf. ?6. Skin/Wound Care: Routine pressure relief measures.  ?7. Fluids/Electrolytes/Nutrition: Following labs as well as ins and outs ?8. Hyponatremia: Stable. Monitor with serial checks.  ?9. Prediabetes: Hgb A1C-6.0. fair control  ?-CBG (last 3)  ?Recent Labs  ?  03/07/22 ?1723 03/08/22 ?1147 03/09/22 ?0559  ?GLUCAP 126* 110* 112*  ? ?Stopped CBGs due to DC TF  ?10. Dysphagia: D3 thin liq  ?          eating 75-100% meals  ?11. Leucocytosis: resolved  ?          ? ?  Latest Ref Rng & Units 03/08/2022  ?  5:28 AM 03/05/2022  ?  5:56 PM 03/01/2022  ?  6:01 AM  ?CBC  ?WBC 4.0 - 10.5 K/uL 5.7   15.4   6.8    ?Hemoglobin 13.0 - 17.0 g/dL 14.5   15.6   15.2    ?  Hematocrit 39.0 - 52.0 % 41.7   44.4   43.0    ?Platelets 150 - 400 K/uL 184   205   239    ?  ?12. Elevated LDL: continue Lipitor. ?13.  AKI: resolved  ? 5/1- Cr 1.08 and BUN doing well at 18= con't to monitor ? ?  Latest Ref Rng & Units 03/08/2022  ?  5:28 AM 03/01/2022  ?  6:01 AM 02/22/2022  ?  6:09 AM  ?BMP  ?Glucose 70 - 99 mg/dL 125   112   142    ?BUN 6 - 20 mg/dL 12   18   25     ?Creatinine 0.61 - 1.24 mg/dL 1.04   1.08   1.06    ?Sodium 135 - 145 mmol/L 135   137   137    ?Potassium 3.5 - 5.1 mmol/L 3.7   3.7   4.1    ?Chloride 98 - 111 mmol/L 106   105   106    ?CO2 22 - 32 mmol/L 23   24   24     ?Calcium 8.9 - 10.3 mg/dL 8.9   8.8   8.5    ? 5/7 continue to encourage p.o. intake ? ?14. Hypocalcemia:  Ionized Ca- 1.11 with normal albumin-4.1.  ?--Add calcium supplement bid.  ?15. Elevated Homocysteine levels:  B12, folate WNL--per Dr. Erlinda Hong , neuro, who felt no treatment needed as less than 50.  ?16. Obesity: BMI 35.53- provide dietary education. ? ?  ? ?LOS: ?20 days ?A FACE TO FACE EVALUATION WAS PERFORMED ? ?Luanna Salk Kiyoto Slomski ?03/10/2022, 9:09 AM  ? ? ? ?

## 2022-03-10 NOTE — Progress Notes (Signed)
Occupational Therapy Session Note ? ?Patient Details  ?Name: Gordon Fisher ?MRN: [id number] ?Date of Birth: September 17, 1961 ? ?Today's Date: 03/10/2022 ?OT Individual Time: [id number] session 1:  ?OT Individual Time Calculation (min): 43 min  ?Session 2: 1447-1536 ( 49 mins) ? ?Short Term Goals: ?Week 3:  OT Short Term Goal 1 (Week 3): pt will complete sit>stand with MIN A+1 wih LRAD ?OT Short Term Goal 2 (Week 3): pt will complete UB bathing with MINA ?OT Short Term Goal 3 (Week 3): pt will complete LBbathing with MOD A +1 ? ?Skilled Therapeutic Interventions/Progress Updates:  ?Session 1: Pt greeted supine in bed nodding in  agreement to OT intervention. Session focus on BADL reeducation, functional mobility, dynamic standing balance and decreasing overall caregiver burden.         ?Pt completed supine>sit to EOB with MIN A needing assist to maneuver RLE to EOB.completed dressing from EOB with pt able to don shirt with MIN A needing assist thread RUE into sleeve, MOD A to don pants, able to stand with +1 today while pt assisted with pulling up pants on L side while OTA pulled up pants on R side. Pt completed squat pivot to w/c with MOD A. Pt transported to gym with total A where pt completed blocked practice of squat pivot from EOM<>w/c progressing to [facility]. Pt worked on [employer] with RUE supported on yoga ball with emphasis on scapular protraction/retraction, trace movement noted. Worked on horizontal ABD/ADD with no active movement noted with RUE on SB. Pt completed squat pivot back to w/c with MINA, pt returned to      pt left                     ? ?Session 2: Pt greeted seated in w/c nodding in agreement to OT intervention. Session focus on family education with wife. Decided to order TTB for walkin shower with opening of door only 23.5 inches, recommended wife remove doors and replace with shower curtain to allow for a wider threshold when entering shower. Also mentioned that wife can wait until [facility] comes to practice  transfer. Discussed recommendation of using stedy for dressing from EOB and for toileting tasks. Wife demo'ed competency with using stedy for sit>stands for pericare and LB dressing tasks. Wife did get some practice with squat pivot transfer from EOB<>w/c with wife on L side and this OTA on R side. Wife reports feeling like she will need more practice with transfers. Provided general education on transfer technique importance of repositioning pts RLE during transfer and supporting RUE. Also discussed need for bari [facility] for home to use at EOB at night and over toilet for increased support, wife agreeable. Pt needed to be returned back to supine at end of session as DVT rescan needed to be completed, MIN A to pivot back to EOB via squat pivot. MIN A for sit>supine. Pt left supine in bed with all needs within reach.  ? ?Therapy Documentation ?Precautions:  ?Precautions ?Precautions: Fall, Other (comment) ?Precaution Comments: R hemi, global aphasia, cortrak ?Restrictions ?Weight Bearing Restrictions: No ?: ?  ?Pain: no pain indicated during either session  ? ? ? ?Therapy/Group: Individual Therapy ? ?[name] ?03/10/2022, 12:31 PM ?

## 2022-03-10 NOTE — Progress Notes (Signed)
Nutrition Follow-up ? ?DOCUMENTATION CODES:  ? ?Obesity unspecified ? ?INTERVENTION:  ? ?Discontinue 30 ml ProSource Plus BID ?Discontinue Magic cup BID with meals ?Continue Multivitamin w/ minerals daily ?Recommend obtaining new weight on pt. ? ?NUTRITION DIAGNOSIS:  ? ?Inadequate oral intake related to dysphagia as evidenced by meal completion < 25%. - Progressing ? ?GOAL:  ? ?Patient will meet greater than or equal to 90% of their needs - Ongoing ? ?MONITOR:  ? ?PO intake, Supplement acceptance, Labs, Weight trends ? ?REASON FOR ASSESSMENT:  ? ?Consult ?Calorie Count, Assessment of nutrition requirement/status ? ?ASSESSMENT:  ? ?61 year old male presents with right sided weakness, right facial droop and slurred speech. CTA showed L-MCA MI occlusion and he underwent cerebral angio with thrombectomy. MRI brain done revealing small areas of acute infarct infarct in L-MCA territory with mild hemorrhagic transformation in left parietal lobe and L-MCA bifurcation residual vascular thrombus. Post op CT showed small SAH. Patient with resultant global aphasia. 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 Admitted to CIR. ? ?04/24 - Cortrak removed  ? ?Pt sitting in wheelchair, wife at bedside and aids in assessment. ? ?Reports that pt intake has been good. States that he is not eating the [employer]. Wife ask about bringing pt snacks and foods from home; RD endorses, pt on a regular diet, thin liquids.  ?Per EMR, pt intake includes: ?5/07: Lunch 100% ?5/08: Breakfast 85%, Lunch 85%, Dinner 75% ?5/09: Breakfast 100%, Lunch 100%, Dinner 100% ?5/10: Breakfast 100% ? ?Wife endorses that pt looks like he has lost weight. Pt with no new weight since 4/25, would recommend obtaining new weight to update pt chart. RD to order daily weight.  ? ?Encourage wife to continue to offer foods from home.  ? ?Medications reviewed and include: Calcium Carbonate, MVI, Protonix, Miralax ?Labs reviewed. ? ?Diet Order:   ?Diet Order   ? ?        ?  Diet heart healthy/carb modified Room service appropriate? Yes; Fluid consistency: Thin  Diet effective now       ?  ? ?  ?  ? ?  ? ?EDUCATION NEEDS:  ? ?Not appropriate for education at this time ? ?Skin:  Skin Assessment: Reviewed RN Assessment ? ?Last BM:  5/9 - Type 4 ? ?Height:  ? ?Ht Readings from Last 1 Encounters:  ?02/23/22 5\' 8"  (1.727 m)  ? ? ?Weight:  ? ?Wt Readings from Last 1 Encounters:  ?02/23/22 106 kg  ? ?BMI:  Body mass index is 35.53 kg/m?. ? ?Estimated Nutritional Needs:  ? ?Kcal:  2200-2400 ? ?Protein:  110-125 grams ? ?Fluid:  >/= 2 L/day ? ? ?02/25/22 RD, LDN ?Clinical Dietitian ?See AMiON for contact information.  ? ?

## 2022-03-10 NOTE — Progress Notes (Addendum)
Patient ID: Gordon Fisher, male   DOB: 11-11-1960, 61 y.o.   MRN: [id number] ?Team Conference Report to Patient/Family ? ?Team Conference discussion was reviewed with the patient and caregiver, including goals, any changes in plan of care and target discharge date.  Patient and caregiver express understanding and are in agreement.  The patient has a target discharge date of 03/19/22. ? ?Sw met with patient spouse and sister in law and provided team conference updates. The patient continues to make steady progress and family would like to begin the education process to have a better ideas of life at home and what to expect. SW arranged family education on this upcoming Friday (1-4PM) with patient spouse. Patient sister in law offered to participate but spouse feels as patient would not feel comfortable completing ADLS with sister in law. SW informed the spouse that this would be beneficial to relieve some burden of care. Family reporting that nursing education was not complete, SW followed up with nursing and education was complete. Nursing will continue to provide additional daily education. Nursing will continue the timed toileting protocol with the patient. Additional family education has been arranged on next week (May 16-18th) 1-4 PM. SW is going to follow up with patient family on the coverage of DME and additional rehab days due to the family reporting insurance states that they have 90 days of rehab. Patient has a custom wheelchair evaluation tomorrow at 11AM and patient spouse will be present. Spouse and sister in law requesting a call from the physician to discuss medical questions and life at discharge. Spouse also requesting to know if the patient will have a repeat MRI before discharge. No additional questions or concerns, SW will allow MD to follow up. Sw will provide patient family with a Materials engineer.  ? ?VA eligibility information provided to Sw from spouse. Sw will attempt to reach out to dicuss  patient VA benefits.  ?Gordon Fisher ([place]) [phone] ? ?Gordon Fisher ?03/10/2022, 1:37 PM  ?

## 2022-03-10 NOTE — Progress Notes (Signed)
Lower extremity venous RT study completed. ? ?Preliminary results relayed to [place], RN on the floor. Callback number provided. RN to contact PA. ? ?See CV Proc for preliminary results report.  ? ?Jean Rosenthal, RDMS, RVT ? ?

## 2022-03-10 NOTE — Patient Care Conference (Signed)
Inpatient RehabilitationTeam Conference and Plan of Care Update ?Date: 03/10/2022   Time: 10:46 AM  ? ? ?Patient Name: Gordon Fisher      ?Medical Record Number: [id number]  ?Date of Birth: [birth year] ?Sex: Male         ?Room/Bed: 4M12C/4M12C-01 ?Payor Info: Payor: Theme park manager / Plan: Theme park manager OTHER / Product Type: *No Product type* /   ? ?Admit Date/Time:  02/18/2022  3:25 PM ? 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 ?Hospital Problems: Principal Problem: ?  Acute ischemic left middle cerebral artery (MCA) stroke (HCC) ? ? ? ?Expected Discharge Date: Expected Discharge Date: 03/19/22 ? ?Team Members Present: ?Physician leading conference: Dr. Alysia Penna ?Social Worker Present: Erlene Quan, BSW ?Nurse Present: [name], RN ?PT Present: Page Spiro, PT ?OT Present: [name], COTA;Jennifer Greenwood, OT ?SLP Present: Sherren Kerns, SLP ?PPS Coordinator present : Gunnar Fusi, SLP ? ?   Current Status/Progress Goal Weekly Team Focus  ?Bowel/Bladder ? ? continent to b/b with episodes of incontinece LBm 5/9  Regain continence      ?Swallow/Nutrition/ Hydration ? ?           ?ADL's ? ? MIN A for bathing from shower level, MOD A for squat pivots, MODA for UB dressing from EOB, MOD A for LB dressing, MOD A for 3/3 toileting tasks. hoping to work on family ed this week with wife. no active movement in RUE, still work on [employer], and estim  MIN A- supervision ,plan to downgrade  E stim, RUE NMR, transfer training, dynamic sitting/standing balance, DME training, family ed   ?Mobility ? ? 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 ? min A comprehension with mildly complex content - progressing to complex, minA for clarity of yes/no responses; mod A for multimodal communication; total A for verbal communication  min A comprehension of basic auditory information; mod-to-max A expression of needs through multimodal communication  repetition/imitation at the phoneme level, reading comprehension, written communication, multimodal communication through communication board   ?Safety/Cognition/ Behavioral Observations ? Focusing on communcation goals at this time.         ?Pain ? ? dneies pain  denies  pain   ?Skin ? ? skin intact  skin to remain intact      ? ? ?Discharge Planning:  ?Discharging home with spouse.  Spouse attempting to obtain VA benefits. Will schuedke family edu   ?Team Discussion: ?Patient making slow steady progress in function but minimal verbal communication.  Able to use a communication board to express needs.  ?Patient on target to meet rehab goals: ?Patient is heavy min for sit - stand and transfers. Requires min assist for upper body care and mod assist for lower body care and toileting. Requires min assist for comprehension of complex concepts.  ? ?*See Care Plan and progress notes for long and short-term goals.  ? ?Revisions to  Treatment Plan:  ?Downgraded goals ?Custom W/C eval ?E-stim ?Ace wrap due to DVT; will needs laced ankle brace for d/c  ?  ?Teaching Needs: ?Safety, communication tips, medications, nutritional modification recommendations, etc  ?Current Barriers to Discharge: ?Decreased caregiver support ? ?Possible Resolutions to Barriers: ?Family education ?HH follow  up services ?DME: Bari BSC, TTB, custom w/c ?  ? ? Medical Summary ?Current Status: No motor recovery thus far, swallowing improved , BP controlled ? Barriers to Discharge: Other (comments) ? Barriers to Discharge Comments: Severe ahasia, RIght hemiplegia ?Possible Resolutions to [employer] Focus: family training this week for d/c next week ? ? ?Continued Need for Acute Rehabilitation Level of Care: The patient requires daily medical management by a physician with specialized training in physical medicine and rehabilitation for the following reasons: ?Direction of a multidisciplinary physical rehabilitation program to maximize functional independence : Yes ?Medical management of patient stability for increased activity during participation in an intensive rehabilitation regime.: Yes ?Analysis of laboratory values and/or radiology reports with any subsequent need for medication adjustment and/or medical intervention. : Yes ? ? ?I attest that I was present, lead the team conference, and concur with the assessment and plan of the team. ? ? ?[name] B ?03/10/2022, 5:36 PM  ? ? ? ? ? ? ?

## 2022-03-10 NOTE — Progress Notes (Signed)
Physical Therapy Session Note ? ?Patient Details  ?Name: Gordon Fisher ?MRN: [id number] ?Date of Birth: 15-Oct-1961 ? ?Today's Date: 03/10/2022 ?PT Individual Time: 8016-5537 ?PT Individual Time Calculation (min): 63 min  ? ?Short Term Goals: ?Week 3:  PT Short Term Goal 1 (Week 3): Pt will complete bed mobility with minA ?PT Short Term Goal 2 (Week 3): Pt will complete bed<>chair transfers with minA and LRAD ?PT Short Term Goal 3 (Week 3): Pt will ambulate 76ft with modA of 1 person ?PT Short Term Goal 4 (Week 3): Pt will be evaluated for custom wheelchair to optimize functional indepdence and safety ? ?Skilled Therapeutic Interventions/Progress Updates:  ?  Pt received sitting in TIS w/c with his wife, Gordon Fisher, and sister-in-law, Gordon Fisher (spelling?) present as well as NT present with report that pt needs to use bathroom. Pt agreeable to therapy session therefore therapist assumed care of patient. Transported into bathroom in TIS w/c.  ? ?L squat pivot transfer TIS w/c>bariatric drop arm BSC with light min assist for balance and cuing for scooting all the way over onto seat - continues to require total assist to manage and position R LE during transfer. Sit<>stand to/from [facility] over toilet using L UE support on grab bar with CGA/min assist for steadying and supporting R UE. Standing with L UE support on grab bar inconsistently with mod assist for balance during max assist LB clothing management - pt noted to be incontinent of bladder in brief but able to further be continent of bladder and bowels on toilet.  ? ?R squat pivot back to TIS w/c with heavy min assist in this direction and continues to require total assist for R LE management/positioning. ? ?Discussed recommendation for pt's wife to purchase a stedy as well as ensured she will be attending wheelchair consult tomorrow.  ? ?Transported to/from gym in w/c for time management and energy conservation.  ? ?Sit<>stands to/from TIS w/c with CGA. During standing balance,  pt demonstrates improving awareness of midline orientation, decreased pushing towards R, and improving motor planning for balance recovery strategies although still delayed and insufficient to maintain upright without external assist. ? ?Donned R LE ankle DF assist ACE wrap and leg lifter to facilitate improved swing phase mechanics. ? ?Gait training ~71ft + ~15ft + ~4ft using B HHA (no longer supporting L UE over +2 assist's shoulders to work towards more natural UE and trunk positioning because when placing pt's arm around tech's shoulders this further promotes pt's compensatory motor plan of arching trunk back towards L when attempting to advance R LE during swing - pt has transitioned from having R anterior trunk LOB when trying to advance R LE to now having excessive L posterior trunk extension and rotation)  ?- requires max/total assist to advance R LE during swing phase and to facilitate avoiding scissoring - pt now demonstrating improved ability to initiate swing ?- continues to have R knee stability during stance but has significant R hip drop and instability during stance ?- +2 mod assist still needed for balance and R LE management ? ?R LE NMR for stance phase stability standing with B HHA and mirror feedback while tapping L LE on/off 4" step - +2 mod assist for balance and therapist guarding R knee and facilitation glute activation as able - repeated cuing to maintain upright posture and looking into mirror to facilitate this with improvements after repetitions.  ? ?Returned to gait training as above but with minimal carryover of glute activation during stance phase  of gait. ? ?At end of session, pt transported back to room and left seated in TIS w/c with needs in reach, seat belt alarm on, R UE supported on pillow, and pt's wife present. ? ? ?Therapy Documentation ?Precautions:  ?Precautions ?Precautions: Fall, Other (comment) ?Precaution Comments: R hemi, global aphasia, cortrak ?Restrictions ?Weight  Bearing Restrictions: No ? ? ?Pain: ?Continues to grimace with touch near R calf region but otherwise no indications of pain. ? ? ?Therapy/Group: Individual Therapy ? ?Gordon Fisher , PT, DPT, NCS, CSRS ?03/10/2022, 7:58 AM  ?

## 2022-03-10 NOTE — Progress Notes (Signed)
Patient ID: Gordon Fisher, male   DOB: June 02, 1961, 61 y.o.   MRN: [id number] ? ?HH resources provided to the family ?

## 2022-03-10 NOTE — Progress Notes (Signed)
Speech Language Pathology Daily Session Note ? ?Patient Details  ?Name: Gordon Fisher ?MRN: [id number] ?Date of Birth: 1961/07/20 ? ?Today's Date: 03/10/2022 ?SLP Individual Time: [id number] ?SLP Individual Time Calculation (min): 60 min ? ?Short Term Goals: ?Week 3: SLP Short Term Goal 1 (Week 3): Patient will consume current diet with minimal s/s of aspiration with mod I to monitor anterior and clear spillage ?SLP Short Term Goal 2 (Week 3): Patient will clarify responses to yes/no questions via multimodal means using gestures (thumbs up/down) or communication board during 50% of opportunities with sup A verbal cues ?SLP Short Term Goal 3 (Week 3): Patient will answer complex yes/no questions with mod-to-max A to achieve 50% accuracy ?SLP Short Term Goal 4 (Week 3): Patient will identify the appropriate object/picture following a verbal description of 2-3 clues with 70% accuracy given mod A verbal cues ?SLP Short Term Goal 5 (Week 3): Patient will approximate written responses to object naming tasks by writing initial 1-2 letters or fill-in-the blank cueing with max A ?SLP Short Term Goal 6 (Week 3): Pt will approximate/imitate at the sound level achieve 75% accuracy with max A multimodal cues. ? ?Skilled Therapeutic Interventions: Skilled ST treatment focused on language goals. SLP facilitated session by providing max A for following basic 1-step commands when given in "point to" and "show me" format. Pt excelled with commands pertaining to body parts (e.g., "touch your ___") with mod A to achieve 75% accuracy. SLP facilitated comprehension of mildly complex yes/no questions with 95% accuracy and min A; pt achieved 65% accuracy with complex yes/no questions and mod A repetition cues. SLP facilitated reading comprehension by identifying field of 3 written words based on attribute (e.g., which one is the tallest/smallest/heaviest) with 100% accuracy with sup A; ID field of 3 written words based on category (e.g.,  which one is the fruit/furniture/drink) with 85% accuracy with min A verbal cues. Patient was left in recliner with alarm activated and immediate needs within reach at end of session. Family at bedside. Continue per current plan of care.   ?   ? ?Pain ? None/denied ? ?Therapy/Group: Individual Therapy ? ?Elwyn Klosinski T Rashid Whitenight ?03/10/2022, 12:34 PM ?

## 2022-03-11 DIAGNOSIS — I824Y9 Acute embolism and thrombosis of unspecified deep veins of unspecified proximal lower extremity: Secondary | ICD-10-CM

## 2022-03-11 DIAGNOSIS — E669 Obesity, unspecified: Secondary | ICD-10-CM

## 2022-03-11 DIAGNOSIS — R7303 Prediabetes: Secondary | ICD-10-CM

## 2022-03-11 DIAGNOSIS — R1312 Dysphagia, oropharyngeal phase: Secondary | ICD-10-CM

## 2022-03-11 MED ORDER — APIXABAN 5 MG PO TABS
5.0000 mg | ORAL_TABLET | Freq: Two times a day (BID) | ORAL | Status: DC
  Administered 2022-03-11 – 2022-03-19 (×16): 5 mg via ORAL
  Filled 2022-03-11 (×16): qty 1

## 2022-03-11 NOTE — Progress Notes (Signed)
Reached out to Dr. Leonie Man who cleared patient for DOAC as almost 4 weeks out. Per discussion with Dr. Christella Noa, will start patient on Eliquis 5 mg bid (no loading dose due to bleed).  ?

## 2022-03-11 NOTE — Progress Notes (Signed)
Occupational Therapy Session Note ? ?Patient Details  ?Name: Gordon Fisher ?MRN: [id number] ?Date of Birth: May 21, 1961 ? ?Today's Date: 03/11/2022 ?OT Individual Time: 3419-6222 ?OT Individual Time Calculation (min): 77 min  and Today's Date: 03/11/2022 ?OT Co-Treatment Time: 1130-1200 ?OT Co-Treatment Time Calculation (min): 30 min ? ? ? ?Short Term Goals: ?Week 3:  OT Short Term Goal 1 (Week 3): pt will complete sit>stand with MIN A+1 wih LRAD ?OT Short Term Goal 2 (Week 3): pt will complete UB bathing with MINA ?OT Short Term Goal 3 (Week 3): pt will complete LBbathing with MOD A +1 ? ?Skilled Therapeutic Interventions/Progress Updates:  ?Session 1: ?Session conducted in conjunction with PT to collaborate objective measures related to w/c evaluation. Discussed need for RUE arm troth ( d/t R hemiplegia)/ arm rests that can be completely removed to assist with ease/accessibility during squat pivot shower transfer to TTB as pts plan is to completed squat pivot to R side when entering the shower and squat pivot to L side when exiting the shower. Also discussed importance of bilateal straps on arm troth to maintain support of RUE during ADL participation. Wife, pt and PT and ATP all present during session collaborating on most appropriate w/c for pt. Pt completed squat pivot back to w/c with MINA from EOM, pt transported back to room with total A where pt left reclined in w/c with alarm belt activated and all needs within reach.  ? ?Session 2: Pt greeted on stedy with NT, pt agreeable to OT intervention via nodding.. Session focus on BADL reeducation, functional mobility, dynamic standing balance, family training with wife and decreasing overall caregiver burden.    ?Pt completed squat pivot into shower to R side with MOD A +1, provided education on set- up and technique of transfer for wife. Wife assisted with bathing pt with pt completing bathing tasks with MINA, abel to stand with grab bars with MIN A while wife  washed back side, did recommend wife install " L shapped" grab bar around front and R side of shower to assist with positioning/ supporting R hemi arm during bathing and to provide support for sit>stand during LB bathing. Pt exited shower to L side with MIN A via squat pivot. Pt completed dressing from w/c using stedy with wife assisting pt, demo'd hemi dressing techniques with wife; wife able to return demonstrate, pt stood to stedy for LB dressing with CGA to stand while wife pulled pants and brief up to waist line.     ?Remainder of session focused on squat pivot transfer training with wife in gym, pt completed squat pivot to R and L from EOM<>w/c with pt/wife doing great going to the L ( MIN- MOD A) but wife needed assist to R side ( MODA from OTA) as pt pushing too much with LUE during squat pivot causing pts hips to rotate and scooting w/c too far away. Will continue efforts to keep practicing and reiterated that the stedy is always a safe option for home.  ?Pt transported back to room,     pt left  up in w/c with alarm belt activated and all needs within reach.                    ? ? ?Therapy Documentation ?Precautions:  ?Precautions ?Precautions: Fall, Other (comment) ?Precaution Comments: R hemi, global aphasia, cortrak ?Restrictions ?Weight Bearing Restrictions: No ? ?Pain:  ?Session 1: no pain noted ?Session2: noted pt to grimace mildly in RLE with mobility,  no redness noted, repositioned as needed.  ? ? ? ?Therapy/Group: Individual Therapy and Co-Treatment ? ?Barron Schmid ?03/11/2022, 3:56 PM ?

## 2022-03-11 NOTE — Progress Notes (Signed)
?                                                       PROGRESS NOTE ? ? ?Subjective/Complaints: ?Aphasic.  Does not indicate any new concerns this AM. ? ?ROS: limited due to language/communication  ? ?Objective: ?  ?VAS [place] LOWER EXTREMITY VENOUS (DVT) ? ?Result Date: 03/10/2022 ? Lower Venous DVT Study Patient Name:  Gordon Fisher  Date of Exam:   03/10/2022 Medical Rec #: [id number]        Accession #:    [id number] Date of Birth: February 06, 1961        Patient Gender: M Patient Age:   61 years Exam Location:  [facility] Procedure:      VAS [place] LOWER EXTREMITY VENOUS (DVT) Referring Phys: Alysia Penna --------------------------------------------------------------------------------  Indications: Follow up DVT right gastrocnemius and peroneal veins.  Comparison Study: 03-05-2022 Bilateral lower extremity venous study. Performing Technologist: Darlin Coco RDMS, RVT  Examination Guidelines: A complete evaluation includes B-mode imaging, spectral Doppler, color Doppler, and power Doppler as needed of all accessible portions of each vessel. Bilateral testing is considered an integral part of a complete examination. Limited examinations for reoccurring indications may be performed as noted. The reflux portion of the exam is performed with the patient in reverse Trendelenburg.  +---------+---------------+---------+-----------+----------+--------------+ RIGHT    CompressibilityPhasicitySpontaneityPropertiesThrombus Aging +---------+---------------+---------+-----------+----------+--------------+ CFV      Full           Yes      Yes                                 +---------+---------------+---------+-----------+----------+--------------+ SFJ      Full                                                        +---------+---------------+---------+-----------+----------+--------------+ FV Prox  Full                                                         +---------+---------------+---------+-----------+----------+--------------+ FV Mid   Full                                                        +---------+---------------+---------+-----------+----------+--------------+ FV DistalFull                                                        +---------+---------------+---------+-----------+----------+--------------+ PFV      Full                                                        +---------+---------------+---------+-----------+----------+--------------+  POP      Full                                                        +---------+---------------+---------+-----------+----------+--------------+ PTV      Full                                                        +---------+---------------+---------+-----------+----------+--------------+ PERO     Partial        Yes      Yes                  Acute          +---------+---------------+---------+-----------+----------+--------------+ Gastroc  None           No       No                   Acute          +---------+---------------+---------+-----------+----------+--------------+   +----+---------------+---------+-----------+----------+--------------+ LEFTCompressibilityPhasicitySpontaneityPropertiesThrombus Aging +----+---------------+---------+-----------+----------+--------------+ CFV Full           Yes      Yes                                 +----+---------------+---------+-----------+----------+--------------+     Summary: RIGHT: - Findings consistent with acute deep vein thrombosis involving the right gastrocnemius veins, and right peroneal veins. - Findings appear essentially unchanged compared to previous examination. - No cystic structure found in the popliteal fossa.  LEFT: - No evidence of common femoral vein obstruction.  *See table(s) above for measurements and observations. Electronically signed by Orlie Pollen on 03/10/2022 at 9:17:21 PM.     Final    ?No results for input(s): WBC, HGB, HCT, PLT in the last 72 hours. ? ?No results for input(s): NA, K, CL, CO2, GLUCOSE, BUN, CREATININE, CALCIUM in the last 72 hours. ? ? ? ?Intake/Output Summary (Last 24 hours) at 03/11/2022 0905 ?Last data filed at 03/11/2022 [id number] ?Gross per 24 hour  ?Intake 1080 ml  ?Output 1700 ml  ?Net -620 ml  ? ?  ? ?  ? ?Physical Exam: ?Vital Signs ?Blood pressure 115/76, pulse 74, temperature 98.2 ?F (36.8 ?C), resp. rate 16, height 5\' 8"  (1.727 m), weight 99.6 kg, SpO2 96 %. ? ?General: No acute distress, in BED ?Mood and affect are appropriate ?Heart: Regular rate and rhythm no rubs murmurs or extra sounds ?Lungs: Clear to auscultation, breathing unlabored, no rales or wheezes ?Abdomen: Positive bowel sounds, soft nontender to palpation, nondistended ?Extremities: No clubbing, cyanosis, or edema.  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  Nods and answers yes to most questions.  Does follow simple commands more than 50% of the time. Uses facial gestures to communicate also ? ?Neurologic: Cranial nerves II through XII grossly intact, motor strength is 5/5 in left and 0/5 RIght deltoid, bicep, tricep, grip, hip flexor, knee extensors, ankle dorsiflexor and plantar flexor ?Trace Right hip add  ?  ?Musculoskeletal: Full range of motion in all 4 extremities. No joint swelling or tenderness ? ? ? ?Assessment/Plan: ?1. Functional deficits  which require 3+ hours per day of interdisciplinary therapy in a comprehensive inpatient rehab setting. ?Physiatrist is providing close team supervision and 24 hour management of active medical problems listed below. ?Physiatrist and rehab team continue to assess barriers to discharge/monitor patient progress toward functional and medical goals ? ?Care Tool: ? ?Bathing ?   ?Body parts bathed by patient: Face, Chest, Abdomen, Right arm  ? Body parts bathed by helper: Left arm ?  ?  ?Bathing assist Assist Level: Minimal  Assistance - Patient > 75% ?  ?  ?Upper Body Dressing/Undressing ?Upper body dressing   ?What is the patient wearing?: Pull over shirt ?   ?Upper body assist Assist Level: Minimal Assistance - Patient > 75% ?   ?Lower Body Dressing/Undressing ?Lower body dressing ? ? ?   ?What is the patient wearing?: Incontinence brief, Pants ? ?  ? ?Lower body assist Assist for lower body dressing: Moderate Assistance - Patient 50 - 74% ?   ? ?Toileting ?Toileting Toileting Activity did not occur (Probation officer and hygiene only): N/A (no void or bm)  ?Toileting assist Assist for toileting: Moderate Assistance - Patient 50 - 74% ?  ?  ?Transfers ?Chair/bed transfer ? ?Transfers assist ?   ? ?Chair/bed transfer assist level: Minimal Assistance - Patient > 75% (squat pivot to MIN A) ?  ?  ?Locomotion ?Ambulation ? ? ?Ambulation assist ? ? Ambulation activity did not occur: Safety/medical concerns ? ?Assist level: 2 helpers (+2 mod A) ?Assistive device: Other (comment) (3 Musketeer) ?Max distance: 154ft  ? ?Walk 10 feet activity ? ? ?Assist ? Walk 10 feet activity did not occur: Safety/medical concerns ? ?  ?   ? ?Walk 50 feet activity ? ? ?Assist Walk 50 feet with 2 turns activity did not occur: Safety/medical concerns ? ?  ?   ? ? ?Walk 150 feet activity ? ? ?Assist Walk 150 feet activity did not occur: Safety/medical concerns ? ?  ?  ?  ? ?Walk 10 feet on uneven surface  ?activity ? ? ?Assist Walk 10 feet on uneven surfaces activity did not occur: Safety/medical concerns ? ? ?  ?   ? ?Wheelchair ? ? ? ? ?Assist Is the patient using a wheelchair?: Yes (Per PT long-term goals) ?Type of Wheelchair: Manual ?Wheelchair activity did not occur: Safety/medical concerns ? ?  ?   ? ? ?Wheelchair 50 feet with 2 turns activity ? ? ? ?Assist ? ?  ?Wheelchair 50 feet with 2 turns activity did not occur: Safety/medical concerns ? ? ?   ? ?Wheelchair 150 feet activity  ? ? ? ?Assist ? Wheelchair 150 feet activity did not occur:  Safety/medical concerns ? ? ?   ? ?Blood pressure 115/76, pulse 74, temperature 98.2 ?F (36.8 ?C), resp. rate 16, height 5\' 8"  (1.727 m), weight 99.6 kg, SpO2 96 %. ? ?Medical Problem List and Plan: ?1. 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            -patient may not shower ?            -ELOS/Goals: 5/19 min/mod A ?          -Continue CIR the

## 2022-03-11 NOTE — Progress Notes (Addendum)
Speech Language Pathology Daily Session Note ? ?Patient Details  ?Name: Gordon Fisher ?MRN: [id number] ?Date of Birth: 03-11-1961 ? ?Today's Date: 03/11/2022 ?SLP Individual Time: 1303-1400 ?SLP Individual Time Calculation (min): 57 min ? ?Short Term Goals: ?Week 3: SLP Short Term Goal 1 (Week 3): Patient will consume current diet with minimal s/s of aspiration with mod I to monitor anterior and clear spillage ?SLP Short Term Goal 2 (Week 3): Patient will clarify responses to yes/no questions via multimodal means using gestures (thumbs up/down) or communication board during 50% of opportunities with sup A verbal cues ?SLP Short Term Goal 3 (Week 3): Patient will answer complex yes/no questions with mod-to-max A to achieve 50% accuracy ?SLP Short Term Goal 4 (Week 3): Patient will identify the appropriate object/picture following a verbal description of 2-3 clues with 70% accuracy given mod A verbal cues ?SLP Short Term Goal 5 (Week 3): Patient will approximate written responses to object naming tasks by writing initial 1-2 letters or fill-in-the blank cueing with max A ?SLP Short Term Goal 6 (Week 3): Pt will approximate/imitate at the sound level achieve 75% accuracy with max A multimodal cues. ? ?Skilled Therapeutic Interventions: Skilled ST treatment focused on language goals. Patient was received in wheelchair on arrival and accompanied by spouse. Upon arrival, spouse was utilizing a dry erase board to facilitate yes/no questions to understand origin of discomfort. Pt was fidgeting, making vocalizations, and rubbing right leg upon arrival. Through asking additional yes/no questions, pt wished to be reclined back in [place] chair, as well as confirmed that R leg/calf was location of discomfort. SLP initially lowered leg rest which appeared to increase discomfort. Leg rest was returned to initial position where it was discovered his knee was pressing on side of leg rest. SLP placed folded washcloth as barrier between  leg and leg rest and this appeared to minimize and eventually eliminate complaints of discomfort. RN was still notified of right leg discomfort due to hx of DVT. SLP facilitated multimodal communication by providing max fading to mod A verbal/visual cues to execute gestures for basic personal needs and requests (e.g., hot/cold, hungry/thirsty, bathroom, etc) to achieve 60% effectiveness. Pt verbally responded to yes/no questions pertaining to biographical information with 100% accuracy for "yes" and "no" verbal responses. Of note, today was the first day patient was able to consistently verbalize the words "no" and "nope" in an appropriate context during ST session. Pt required mod A verbal cues to verbalize "no" instead of simply shaking head side to side or expressing "nuh uh". Verbalization of "no" significantly increased clarity of responses as compared to previous encounters. SLP facilitated written communication at the word level with max A verbal/visual cues. Pt was able to write up to 50% of words without SLP support and benefited from letter cues to complete word with correct spelling. SLP facilitated use of picture based communication boards with sup A to achieve 100% accuracy. Example, "show me hungry/thirsty/need bathroom" etc. Throughout session SLP provided education to spouse on expressive/receptive and multimodal communication strategies. Spouse verbalized and demonstrated understanding through various encounters with patient. Pt continues to demonstrate gradual progress with use of multimodal communication, comprehension skills, and verbal responses to yes/no questions which consequently enhance his ability to both comprehend and communicate functional needs/wishes/discomfort. Patient was left in wheelchair with alarm activated and immediate needs within reach at end of session. NT present. Continue per current plan of care.   ?   ? ?Pain ?  ? ?Therapy/Group: Individual Therapy ? ?  Mathews Stuhr T  Damen Windsor ?03/11/2022, 2:12 PM ?

## 2022-03-11 NOTE — Plan of Care (Signed)
?  Problem: Sit to Stand ?Goal: LTG:  Patient will perform sit to stand in prep for activites of daily living with assistance level (OT) ?Description: LTG:  Patient will perform sit to stand in prep for activites of daily living with assistance level (OT) ?Flowsheets (Taken 03/11/2022 0713) ?LTG: PT will perform sit to stand in prep for activites of daily living with assistance level: (downgraded JLS) Minimal Assistance - Patient > 75% ?Note: downgraded JLS ?  ?Problem: RH Toileting ?Goal: LTG Patient will perform toileting task (3/3 steps) with assistance level (OT) ?Description: LTG: Patient will perform toileting task (3/3 steps) with assistance level (OT)  ?Flowsheets (Taken 03/11/2022 0713) ?LTG: Pt will perform toileting task (3/3 steps) with assistance level: (downgraded JLS) Moderate Assistance - Patient 50 - 74% ?Note: downgraded JLS ?  ?Problem: RH Tub/Shower Transfers ?Goal: LTG Patient will perform tub/shower transfers w/assist (OT) ?Description: LTG: Patient will perform tub/shower transfers with assist, with/without cues using equipment (OT) ?Flowsheets (Taken 03/11/2022 0713) ?LTG: Pt will perform tub/shower stall transfers with assistance level of: (downgraded JLS) Moderate Assistance - Patient 50 - 74% ?Note: downgraded JLS ?  ?

## 2022-03-11 NOTE — Progress Notes (Signed)
Physical Therapy Session Note ? ?Patient Details  ?Name: Gordon Fisher ?MRN: [id number] ?Date of Birth: Oct 24, 1961 ? ?Today's Date: 03/11/2022 ?PT Individual Time: 3710-6269 ?PT Individual Time Calculation (min): 40 min   ?and  ?Today's Date: 03/11/2022 ?PT Co-Treatment Time: 1115-1130 ?PT Co-Treatment Time Calculation (min): 15 min ? ?Short Term Goals: ?Week 3:  PT Short Term Goal 1 (Week 3): Pt will complete bed mobility with minA ?PT Short Term Goal 2 (Week 3): Pt will complete bed<>chair transfers with minA and LRAD ?PT Short Term Goal 3 (Week 3): Pt will ambulate 60ft with modA of 1 person ?PT Short Term Goal 4 (Week 3): Pt will be evaluated for custom wheelchair to optimize functional indepdence and safety ? ?Skilled Therapeutic Interventions/Progress Updates:  ?  Pt received supine in bed with bed noticeably soiled due to pt's male purewick not connected to suction - notified nursing staff and they were present to assess and therapy reinforced with nursing staff that pt needs to be on timed toileting protocol with nursing staff assisting pt to bathroom at scheduled time intervals. Pt agreeable to therapy session nodding head yes. Supine>sitting R EOB, HOB slightly elevated and pt relying heavily on bedrail, with min assist for trunk upright. Sit>stand EOB>stedy with CGA/light min assist for balance. Stedy transfer in/out bathroom. Pt further continent of bladder and bowels on toilet. Standing in stedy with CGA for steadying during dependent assist peri-care to ensure cleanliness. In high-perched sitting in stedy donned shirt with min/mod assist for R UE management and time management. Stedy transfer back to TIS w/c. Sitting in w/c donned pants, socks, and shoes with total assist for time management. Sit>stand from TIS w/c with CGA for steadying and then heavy min assist for balance while pt pulled pants up over hips with min assist for R side.  ? ?Pt's wife, Gordon Fisher, arrived. ? ?Gordon Fisher, ATP present for wheelchair  consultation. Transported pt to ortho gym to perform mat evaluation. R squat pivot transfer w/c>EOM with min assist - continues to require total assist to manage R LE positioning.  ? ?Assessed pt's sitting posture, alignment, and UE/LE strength and ROM as part of wheelchair evaluation. Discussed wheelchair needs as follows with pt, wife, ATP, and Gordon Fisher, COTA:  ?- tilt feature vs no tilt feature and determined that pt did not need tilt because with his wife's assistance and use of a stedy, he can come to standing for full pressure relief (pt's wife has already ordered a stedy type of device for home use)  ?- need for supportive seat cushion to improve pelvic alignment as pt tends to have pelvis rotated after having performed squat pivot transfer due to continued R hemiplegia ?- need for incontinence liner on seat cushion due to bladder incontinence ?- need for a contoured back support to promote midline trunk alignment and improve sitting balance safety during MRADLs ?- need for R UE arm trough due to continued plegia to protect and support the arm  ?- need for removable armrests to allow squat pivot transfers  ? ?Pt and wife in agreement with recommendations. Loaner W/C from [employer] planning to arrive on Monday 5/15 at 1:30pm.  ? ?L squat pivot back to w/c with min assist. Discussed plan to initiate car transfer training tomorrow - wife in agreement. Transported back to room and pt left seated tiled back in w/c with needs in reach, R UE supported on pillow, and his wife present. ? ?Therapy Documentation ?Precautions:  ?Precautions ?Precautions: Fall, Other (comment) ?Precaution  Comments: R hemi, global aphasia, cortrak ?Restrictions ?Weight Bearing Restrictions: No ? ? ?Pain: ? No indication of pain throughout session. ? ? ? ?Therapy/Group: Individual Therapy ? ?Ginny Forth , PT, DPT, NCS, CSRS ?03/11/2022, 7:55 AM  ?

## 2022-03-12 ENCOUNTER — Inpatient Hospital Stay (HOSPITAL_COMMUNITY): Payer: 59

## 2022-03-12 DIAGNOSIS — N179 Acute kidney failure, unspecified: Secondary | ICD-10-CM

## 2022-03-12 NOTE — Progress Notes (Signed)
Physical Therapy Session Note ? ?Patient Details  ?Name: Gordon Fisher ?MRN: [id number] ?Date of Birth: 11-04-1960 ? ?Today's Date: 03/12/2022 ?PT Individual Time: [id number] ?PT Individual Time Calculation (min): 75 min  ? ?Short Term Goals: ?Week 3:  PT Short Term Goal 1 (Week 3): Pt will complete bed mobility with minA ?PT Short Term Goal 2 (Week 3): Pt will complete bed<>chair transfers with minA and LRAD ?PT Short Term Goal 3 (Week 3): Pt will ambulate 77ft with modA of 1 person ?PT Short Term Goal 4 (Week 3): Pt will be evaluated for custom wheelchair to optimize functional indepdence and safety ? ?Skilled Therapeutic Interventions/Progress Updates:  ?  Pt received sitting in TIS w/c and agreeable to therapy session. Nurse notified and pt transported down to main entrance of hospital in w/c to perform real-life car transfer with pt's wife.  ? ?Performed block practice stand pivot car transfers to/from TIS w/c and [employer] SUV. Requires light mod assist for balance and managing R LE as pt unable to initiate a lateral step with that LE and requires assist to lift R LE into vehicle - therapist utilized a 2nd gait belt to make a "leg loop" around his thigh and pt able to use L UE to assist with bringing R LE into the vehicle. Pt requires visual demonstration and verbal education on ensuring he allows therapist/wife time to "step" R LE for him during the transfers because he tends to only step with L LE. Pt performed ~4 reps of the transfer with therapist assistance and then another ~4reps with pt's wife's assistance while therapist provided both pt and wife multimodal cuing on improving sequencing and assistance positioning to increase pt independence. ? ?Pt's wife reports feeling better about transferring pt in/out vehicle but would like additional hands-on training with this next week. Discussed that pt would benefit from having a leg lifter to allow pt's wife increased ease of assisting him stepping this leg as  well as allow pt to help lift his leg into the vehicle. ? ?Transported pt back up to CIR. ? ?Donned R LE [place] [facility] PLS AFO. Due to pt's aphasia difficult to determine why he does not like wearing a brace and the best understanding therapist was able to achieve was that pt wanted to be able to do without a brace. Therapist educated pt that it does not have to be something he uses forever but he needs something to protect his ankle and keep it in DF position to allow him to participate safely in functional mobility tasks. ? ?Donned leg lifter.  ? ?In // bars with L UE support performed R/L lateral side stepping with mirror feedback focusing on lateral weight shifting and pt attempting to initiate a lateral step with R LE for carryover into stand pivot transfers - requires light mod assist for balance but total assist to facilitate R step - cuing for L weight shift and maintaining upright posture when stepping R LE. ? ?Gait training ~42ft with B HHA, +2 light mod assist for balance and max/total assist to advance R LE during swing  ?- therapist on L side facilitated upward and forward lifting of L forearm, which promoted pt to achieve positive step length on L side for a reciprocal gait pattern while this therapist on the R side was facilitating improved R hip alignment (b/c pt continues to have significant hip drop during R stance) to improve stance control and allow longer R stance time   ?- pt continues  to have slight initiation of R LE advancement during swing but continues to require max/total assist to achieve positive step length ?- pt able to maintain R knee control during stance but does benefit from facilitation at hips for improved stability ? ? ?At end of session, pt transported back to room and left sitting in TIS w/c with needs in reach, R UE supported on pillow, and pt's wife present. ? ? ? ? ?Therapy Documentation ?Precautions:  ?Precautions ?Precautions: Fall, Other (comment) ?Precaution  Comments: R hemi, global aphasia, cortrak ?Restrictions ?Weight Bearing Restrictions: No ? ? ?Pain: ?Denies pain during session when questioned and no other indications of pain. ? ? ? ?Therapy/Group: Individual Therapy ? ?Tawana Scale , PT, DPT, NCS, CSRS ?03/12/2022, 8:02 AM  ?

## 2022-03-12 NOTE — Progress Notes (Signed)
?                                                       PROGRESS NOTE ? ? ?Subjective/Complaints: ?Aphasic.  Unable to elicit any new concerns this AM. Spoke with patient and wife yesterday afternoon and answered all questions. ? ?ROS: limited due to language/communication  ? ?Objective: ?  ?VAS [place] LOWER EXTREMITY VENOUS (DVT) ? ?Result Date: 03/10/2022 ? Lower Venous DVT Study Patient Name:  Gordon Fisher  Date of Exam:   03/10/2022 Medical Rec #: [id number]        Accession #:    [id number] Date of Birth: 1960/11/04        Patient Gender: M Patient Age:   61 years Exam Location:  [facility] Procedure:      VAS [place] LOWER EXTREMITY VENOUS (DVT) Referring Phys: Alysia Penna --------------------------------------------------------------------------------  Indications: Follow up DVT right gastrocnemius and peroneal veins.  Comparison Study: 03-05-2022 Bilateral lower extremity venous study. Performing Technologist: Darlin Coco RDMS, RVT  Examination Guidelines: A complete evaluation includes B-mode imaging, spectral Doppler, color Doppler, and power Doppler as needed of all accessible portions of each vessel. Bilateral testing is considered an integral part of a complete examination. Limited examinations for reoccurring indications may be performed as noted. The reflux portion of the exam is performed with the patient in reverse Trendelenburg.  +---------+---------------+---------+-----------+----------+--------------+ RIGHT    CompressibilityPhasicitySpontaneityPropertiesThrombus Aging +---------+---------------+---------+-----------+----------+--------------+ CFV      Full           Yes      Yes                                 +---------+---------------+---------+-----------+----------+--------------+ SFJ      Full                                                        +---------+---------------+---------+-----------+----------+--------------+ FV Prox  Full                                                         +---------+---------------+---------+-----------+----------+--------------+ FV Mid   Full                                                        +---------+---------------+---------+-----------+----------+--------------+ FV DistalFull                                                        +---------+---------------+---------+-----------+----------+--------------+ PFV      Full                                                        +---------+---------------+---------+-----------+----------+--------------+  POP      Full                                                        +---------+---------------+---------+-----------+----------+--------------+ PTV      Full                                                        +---------+---------------+---------+-----------+----------+--------------+ PERO     Partial        Yes      Yes                  Acute          +---------+---------------+---------+-----------+----------+--------------+ Gastroc  None           No       No                   Acute          +---------+---------------+---------+-----------+----------+--------------+   +----+---------------+---------+-----------+----------+--------------+ LEFTCompressibilityPhasicitySpontaneityPropertiesThrombus Aging +----+---------------+---------+-----------+----------+--------------+ CFV Full           Yes      Yes                                 +----+---------------+---------+-----------+----------+--------------+     Summary: RIGHT: - Findings consistent with acute deep vein thrombosis involving the right gastrocnemius veins, and right peroneal veins. - Findings appear essentially unchanged compared to previous examination. - No cystic structure found in the popliteal fossa.  LEFT: - No evidence of common femoral vein obstruction.  *See table(s) above for measurements and observations. Electronically signed by Orlie Pollen  on 03/10/2022 at 9:17:21 PM.    Final    ?No results for input(s): WBC, HGB, HCT, PLT in the last 72 hours. ? ?No results for input(s): NA, K, CL, CO2, GLUCOSE, BUN, CREATININE, CALCIUM in the last 72 hours. ? ? ? ?Intake/Output Summary (Last 24 hours) at 03/12/2022 0913 ?Last data filed at 03/12/2022 0813 ?Gross per 24 hour  ?Intake 1070 ml  ?Output 850 ml  ?Net 220 ml  ? ?  ? ?  ? ?Physical Exam: ?Vital Signs ?Blood pressure 108/74, pulse 76, temperature 97.8 ?F (36.6 ?C), resp. rate 18, height 5\' 8"  (1.727 m), weight 99.6 kg, SpO2 97 %. ? ?General: No acute distress, in BED ?Mood and affect are appropriate ?Heart: Regular rate and rhythm no rubs murmurs or extra sounds ?Lungs: Clear to auscultation, breathing unlabored, no rales or wheezes ?Abdomen: Positive bowel sounds, soft nontender to palpation, nondistended ?Extremities: No clubbing, cyanosis, or edema.  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  Nods and answers yes to most questions. Sometimes shakes head for no.  Does follow simple commands more than 50% of the time. Uses facial gestures to communicate also ? ?Neurologic: Cranial nerves II through XII grossly intact, motor strength is 5/5 in left and 0/5 RIght deltoid, bicep, tricep, grip, hip flexor, knee extensors, ankle dorsiflexor and plantar flexor ?Trace Right hip add  ?  ?Musculoskeletal: Full range of motion in all 4 extremities. No joint swelling or tenderness ? ? ? ?  Assessment/Plan: ?1. Functional deficits which require 3+ hours per day of interdisciplinary therapy in a comprehensive inpatient rehab setting. ?Physiatrist is providing close team supervision and 24 hour management of active medical problems listed below. ?Physiatrist and rehab team continue to assess barriers to discharge/monitor patient progress toward functional and medical goals ? ?Care Tool: ? ?Bathing ?   ?Body parts bathed by patient: Face, Chest, Abdomen, Right arm  ? Body parts bathed by helper:  Left arm ?  ?  ?Bathing assist Assist Level: Minimal Assistance - Patient > 75% ?  ?  ?Upper Body Dressing/Undressing ?Upper body dressing   ?What is the patient wearing?: Pull over shirt ?   ?Upper body assist Assist Level: Minimal Assistance - Patient > 75% ?   ?Lower Body Dressing/Undressing ?Lower body dressing ? ? ?   ?What is the patient wearing?: Incontinence brief, Pants ? ?  ? ?Lower body assist Assist for lower body dressing: Moderate Assistance - Patient 50 - 74% ?   ? ?Toileting ?Toileting Toileting Activity did not occur (Probation officer and hygiene only): N/A (no void or bm)  ?Toileting assist Assist for toileting: Moderate Assistance - Patient 50 - 74% ?  ?  ?Transfers ?Chair/bed transfer ? ?Transfers assist ?   ? ?Chair/bed transfer assist level: Minimal Assistance - Patient > 75% (squat pivot to MIN A) ?  ?  ?Locomotion ?Ambulation ? ? ?Ambulation assist ? ? Ambulation activity did not occur: Safety/medical concerns ? ?Assist level: 2 helpers (+2 mod A) ?Assistive device: Other (comment) (3 Musketeer) ?Max distance: 166ft  ? ?Walk 10 feet activity ? ? ?Assist ? Walk 10 feet activity did not occur: Safety/medical concerns ? ?  ?   ? ?Walk 50 feet activity ? ? ?Assist Walk 50 feet with 2 turns activity did not occur: Safety/medical concerns ? ?  ?   ? ? ?Walk 150 feet activity ? ? ?Assist Walk 150 feet activity did not occur: Safety/medical concerns ? ?  ?  ?  ? ?Walk 10 feet on uneven surface  ?activity ? ? ?Assist Walk 10 feet on uneven surfaces activity did not occur: Safety/medical concerns ? ? ?  ?   ? ?Wheelchair ? ? ? ? ?Assist Is the patient using a wheelchair?: Yes (Per PT long-term goals) ?Type of Wheelchair: Manual ?Wheelchair activity did not occur: Safety/medical concerns ? ?  ?   ? ? ?Wheelchair 50 feet with 2 turns activity ? ? ? ?Assist ? ?  ?Wheelchair 50 feet with 2 turns activity did not occur: Safety/medical concerns ? ? ?   ? ?Wheelchair 150 feet activity  ? ? ? ?Assist ?  Wheelchair 150 feet activity did not occur: Safety/medical concerns ? ? ?   ? ?Blood pressure 108/74, pulse 76, temperature 97.8 ?F (36.6 ?C), resp. rate 18, height 5\' 8"  (1.727 m), weight 99.6 kg, SpO2 97 %. ? ?Medical Problem List and Plan: ?1. 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

## 2022-03-12 NOTE — Progress Notes (Signed)
Physical Therapy Session Note ? ?Patient Details  ?Name: Gordon Fisher ?MRN: [id number] ?Date of Birth: Nov 27, 1960 ? ?Today's Date: 03/12/2022 ?PT Individual Time: [id number] ?PT Individual Time Calculation (min): 44 min  ? ?Short Term Goals: ?Week 3:  PT Short Term Goal 1 (Week 3): Pt will complete bed mobility with minA ?PT Short Term Goal 2 (Week 3): Pt will complete bed<>chair transfers with minA and LRAD ?PT Short Term Goal 3 (Week 3): Pt will ambulate 40ft with modA of 1 person ?PT Short Term Goal 4 (Week 3): Pt will be evaluated for custom wheelchair to optimize functional indepdence and safety ? ? ?Skilled Therapeutic Interventions/Progress Updates:  ?   ?Pt presenting in TIS w/c to start with his wife at the bedside. Focused session on family training. Reviewed DC planning, DME rec's, and role of f/u therapies. Wife confirming DME: hospital bed, speciality w/c, tub transfer bench, and BSC. ? ?Pt transported to ortho rehab gym for time management. Focused remainder of session on blocked practice squat pivot transfers from w/c to mat table in both directions - mat table set at 19 inches high - level with wheelchair. ? ? Reviewed setup for w/c placement during transfers and w/c management with removing leg rests and arm rests. PT completing x2 transfers with CGA/minA in both directions. Wife completing several squat<>pivot transfers (at least x8) with PT providing supervision only for safety. Wife does well with guarding and ensuring correct setup with feet placement and w/c management. Wife reporting increased confidence with assisting him complete squat<>pivot transfers and is excited about her ability to manage him at home. Another family member arrived during session and was actively observing for the squat<>pivot transfers (patient or wife's sister?). ? ?Pt returned to his room and concluded session seated in TIS w/c, family members at bedside. Pt appearing to request to use the bathroom at end of session.  Due to time constraints, notified NT to assist with toileting needs. Family aware.  ? ?Therapy Documentation ?Precautions:  ?Precautions ?Precautions: Fall, Other (comment) ?Precaution Comments: R hemi, global aphasia, cortrak ?Restrictions ?Weight Bearing Restrictions: No ?General: ?  ? ? ?Therapy/Group: Individual Therapy ? ?Tichina Koebel P Mckennah Kretchmer ?03/12/2022, 7:31 AM  ?

## 2022-03-12 NOTE — Progress Notes (Signed)
Occupational Therapy Session Note ? ?Patient Details  ?Name: Gordon Fisher ?MRN: [id number] ?Date of Birth: 09-24-1961 ? ?Today's Date: 03/12/2022 ?OT Individual Time: 1345-1440 ?OT Individual Time Calculation (min): 55 min  ? ? ?Short Term Goals: ?Week 3:  OT Short Term Goal 1 (Week 3): pt will complete sit>stand with MIN A+1 wih LRAD ?OT Short Term Goal 2 (Week 3): pt will complete UB bathing with MINA ?OT Short Term Goal 3 (Week 3): pt will complete LBbathing with MOD A +1 ? ?Skilled Therapeutic Interventions/Progress Updates:  ? Family education session completed with pt and his wife and sister in law. Verbal education provided re fall risk reduction, energy conservation strategies, home carryover of transfer training, ADLs, and IADLs. Majority of session focused on problem solving through various ways to complete shower transfer. Pt's wife completed blocked practice squat pivot transfers from w/c > TTB with cueing from OT for simplifying cueing and safety but overall she demonstrated safe and competent handling of the pt. Also practiced with the stedy and provided examples of ways to pull out TTB far enough so stedy could fit against shower threshold. Also provided examples to increase safety in the shower, like moving shower chair closer to the wall to reduce area to fall, slip resistance shoes and/or mats, and positioning for caregiver. Recommended they wait for HHOT to complete first shower to reduce anxiety re transfer and to complete a "dry run". Provided measurements of grab bar height as they planned to install some in the shower. Pt returned to his room and was left sitting up with all needs met, wife present.  ?  ? ?Therapy Documentation ?Precautions:  ?Precautions ?Precautions: Fall, Other (comment) ?Precaution Comments: R hemi, global aphasia, cortrak ?Restrictions ?Weight Bearing Restrictions: No ? ?Therapy/Group: Individual Therapy ? ?Curtis Sites ?03/12/2022, 6:32 AM ?

## 2022-03-12 NOTE — Progress Notes (Signed)
Speech Language Pathology Weekly Progress and Session Note ? ?Patient Details  ?Name: Gordon Fisher ?MRN: [id number] ?Date of Birth: Sep 10, 1961 ? ?Beginning of progress report period: Mar 05, 2022 ?End of progress report period: Mar 12, 2022 ? ?Today's Date: 03/12/2022 ?SLP Individual Time: 3016-0109 ?SLP Individual Time Calculation (min): 45 min ? ?Short Term Goals: ?Week 3: SLP Short Term Goal 1 (Week 3): Patient will consume current diet with minimal s/s of aspiration with mod I to monitor anterior and clear spillage ?SLP Short Term Goal 1 - Progress (Week 3): Met ?SLP Short Term Goal 2 (Week 3): Patient will clarify responses to yes/no questions via multimodal means using gestures (thumbs up/down) or communication board during 50% of opportunities with sup A verbal cues ?SLP Short Term Goal 2 - Progress (Week 3): Met ?SLP Short Term Goal 3 (Week 3): Patient will answer complex yes/no questions with mod-to-max A to achieve 50% accuracy ?SLP Short Term Goal 3 - Progress (Week 3): Met ?SLP Short Term Goal 4 (Week 3): Patient will identify the appropriate object/picture following a verbal description of 2-3 clues with 70% accuracy given mod A verbal cues ?SLP Short Term Goal 4 - Progress (Week 3): Met ?SLP Short Term Goal 5 (Week 3): Patient will approximate written responses to object naming tasks by writing initial 1-2 letters or fill-in-the blank cueing with max A ?SLP Short Term Goal 5 - Progress (Week 3): Met ?SLP Short Term Goal 6 (Week 3): Pt will approximate/imitate at the sound level achieve 75% accuracy with max A multimodal cues. ?SLP Short Term Goal 6 - Progress (Week 3): Met ? ? ?New Short Term Goals: ?Week 4: SLP Short Term Goal 1 (Week 4): STG=LTG due to ELOS ? ?Weekly Progress Updates: Patient has made excellent gains and has met 6 of 6 STGs this reporting period due to improved receptive language skills, verbal approximations, spontaneous speech, use of multimodal communication through written  means/gestures/low tech communication systems, reading comprehension, and written expression. Pt remains stimulable for skilled interventions as evident by subtle improvement in pt's performance from session to session. Pt is currently tolerating a regular consistency diet with thin liquids and is mod I for implementation of safe swallow precautions and strategies. Pt is currently completing language tasks with min A for comprehension, and mod A for communicating needs through multimodal communication. Patient and family education is ongoing. Patient would benefit from continued skilled SLP intervention to maximize speech/language functioning and overall functional communication prior to discharge.   ?  ?Intensity: Minumum of 1-2 x/day, 30 to 90 minutes ?Frequency: 3 to 5 out of 7 days ?Duration/Length of Stay: 5/19 ?Treatment/Interventions: Cognitive remediation/compensation;Environmental controls;Multimodal communication approach;Speech/Language Firefighter;Dysphagia/aspiration precaution training;Functional tasks;Patient/family education;Therapeutic Activities ? ? ?Daily Session ?Skilled Therapeutic Interventions: Skilled ST treatment focused on language goals and education with spouse and sister-in-law. SLP provided education through verbal/visual/demonstration re: aphasia, apraxia of speech, communication strategies, and multimodal communication. Family verbalized and demonstrated understanding throughout session. Patient verbalized yeah/yep and no/nope to concrete yes/no questions with 100% accuracy and with mod fading to sup A cues to verbalize "no". Pt remains consistent with communication of "yes" responses. Pt identified preferences given field of 2 choices using gestures (option 1 and 2 with fingers) with min fading to sup A verbal cues during 100% of occasions. Patient imitated sounds and articulatory placement with overall mod A to produce vowels, bilabial, velar, and alveolar  consonants. Max A verbal/visual/tactile cues were beneficial to produce /p/ and /h/. Pt continues to exhibit  difficulty with fricatives dental, and labiodental sounds. Pt verbalized his name "[name]" with max fading to mod A verbal/visual and repetition cues. Pt stimulable to correction and cueing hierarchy. Pt pointed to letters in field of 14-16 with 80% accuracy with min A and numbers in field of 10 with 100% accuracy with sup A cues. Pt benefited from reading glasses for visual acuity. Family were very pleased to see pt's progress with multimodal communication. Spouse reported pt exhibited carry over with gestures following yesterday's session which allowed pt to effectively communicate his needs in the moment. Patient was left in chair with alarm activated and immediate needs within reach at end of session. Continue per current plan of care.      ? ?General  ?  ?Pain ?  ? ?Therapy/Group: Individual Therapy ? ?Shahd Occhipinti T Verleen Stuckey ?03/12/2022, 4:54 PM ? ? ? ? ? ? ?

## 2022-03-13 NOTE — Progress Notes (Signed)
Physical Therapy Session Note ? ?Patient Details  ?Name: Gordon Fisher ?MRN: [id number] ?Date of Birth: Aug 11, 1961 ? ?Today's Date: 03/13/2022 ?PT Individual Time: 1100-1200 ?PT Individual Time Calculation (min): 60 min  ? ?Short Term Goals: ?Week 1:  PT Short Term Goal 1 (Week 1): Pt will transfer sup <> sit w/ mod A ?PT Short Term Goal 1 - Progress (Week 1): Met ?PT Short Term Goal 2 (Week 1): Pt will transfer sit to stand w/ Max A from lower surface. ?PT Short Term Goal 2 - Progress (Week 1): Met ?PT Short Term Goal 3 (Week 1): Pt will perform SPT w/ max A ?PT Short Term Goal 3 - Progress (Week 1): Met ?Week 2:  PT Short Term Goal 1 (Week 2): Pt will perform supine<>sit with min assist ?PT Short Term Goal 1 - Progress (Week 2): Not met ?PT Short Term Goal 2 (Week 2): Pt will perform sit<>stands using LRAD with mod assist  of 1 ?PT Short Term Goal 2 - Progress (Week 2): Met ?PT Short Term Goal 3 (Week 2): Pt will perform bed<>chair transfers using LRAD wtih mod assist of 1 ?PT Short Term Goal 3 - Progress (Week 2): Met ?PT Short Term Goal 4 (Week 2): Pt will ambulate at least 21f using LRAD with max assist of 1 and +2 min assist ?PT Short Term Goal 4 - Progress (Week 2): Not met ?PT Short Term Goal 5 (Week 2): Pt will participate in [place]?PT Short Term Goal 5 - Progress (Week 2): Met ?Week 3:  PT Short Term Goal 1 (Week 3): Pt will complete bed mobility with minA ?PT Short Term Goal 2 (Week 3): Pt will complete bed<>chair transfers with minA and LRAD ?PT Short Term Goal 3 (Week 3): Pt will ambulate 230fwith modA of 1 person ?PT Short Term Goal 4 (Week 3): Pt will be evaluated for custom wheelchair to optimize functional indepdence and safety ? ?Skilled Therapeutic Interventions/Progress Updates:  ?  Pt supine to sit w/mod assist from R side. ?Total assist to don shoes for time management.  Pt adamantly refused RLE bracing today. ?stand pivot transfer to wc w/mod assist, decreased safety  awareness w/positioning/progression of RLE during transition. Transported to gym. ? ?Sit to stand from wc w/min assist, cueing for midline, therapist guarding R hip/knee, pt unable to extend R knee fully or retract R hip in standing/apraxia vs motor control? ? ?+2 max assist for standing to tall kneeling (facing mat) on mat, total assist to position RLE in symmetrical alignment and stabilize due to abduction tendency.  Worked on midline control in tall kneeling progressing to sitting on haunches to tall kneeling. Overall + 2 min assist/mod at times, decreased pelvic and core stability. ? ?Supine to prone w/assist to position RUE/RLE.  Prone to quadruped max of 2, difficulty positioing RLE due to extension tendency/thrust, defaulted back to  prone and R sidelying ? ?In sidelying RLE positionined in 90/90 hip/knee flexion.  Pt then assisted to quadruped maintining RLE, second person managing RUE for position/stabilization.  Pt able to achieve quadruped, hips drift to R but able to wt shift w/mod assist and multimodal cueing.  Position is fatigueing esp due to increased LUE fatigue. ? ?Repeated s/l to quadruped then transitioned quad to tall kneeling w/mod assist of 2.  Initially leans R decreased activation of R flank but using mirror for feedback and L forearm pressure on wall to L pt able to activate/increasingly thru task R trunk/hip and  improve midline. ? ?Repeated quadruped as above then added overhead reach w/LUE which further facilitated trunk, glut activation, quad/HS coocontraction, improved midline. ? ? ?Pt required several min sitting or supine rest between above activities. ? ?Mat to wc and wc to bed squat pivot mod assist.  Sit to supine w/min assist and rail. NT in to assist w/urinary incontinence needs. Pt left supine w/rails up x 3, alarm set, bed in lowest position, and needs in reach. ? ? ? ?Therapy Documentation ?Precautions:  ?Precautions ?Precautions: Fall, Other (comment) ?Precaution Comments: R  hemi, global aphasia, cortrak ?Restrictions ?Weight Bearing Restrictions: No ?  ?Other Treatments:   ? ? ? ?Therapy/Group: Individual Therapy ?Callie Fielding, PT ? ? ?Jerrilyn Cairo ?03/13/2022, 12:14 PM  ?

## 2022-03-13 NOTE — Progress Notes (Signed)
?                                                       PROGRESS NOTE ? ? ?Subjective/Complaints: ?Aphasic.  No new concerns elicited today. Limited by aphasia.  ? ?ROS: limited due to language/communication  ? ?Objective: ?  ?No results found. ?No results for input(s): WBC, HGB, HCT, PLT in the last 72 hours. ? ?No results for input(s): NA, K, CL, CO2, GLUCOSE, BUN, CREATININE, CALCIUM in the last 72 hours. ? ? ? ?Intake/Output Summary (Last 24 hours) at 03/13/2022 1140 ?Last data filed at 03/13/2022 0750 ?Gross per 24 hour  ?Intake 1320 ml  ?Output --  ?Net 1320 ml  ? ?  ? ?  ? ?Physical Exam: ?Vital Signs ?Blood pressure 116/78, pulse 73, temperature 97.7 ?F (36.5 ?C), temperature source Oral, resp. rate 17, height 5\' 8"  (1.727 m), weight 99.6 kg, SpO2 97 %. ? ?General: No acute distress, in BED this AM ?Mood and affect are appropriate ?Heart: Regular rate and rhythm no rubs murmurs or extra sounds ?Lungs: Clear to auscultation, breathing unlabored, no rales or wheezes ?Abdomen: Positive bowel sounds, soft nontender to palpation, nondistended ?Extremities: No clubbing, cyanosis, or edema.  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  Nods and answers yes to most questions. Sometimes shakes head for no.  Does follow simple commands more than 50% of the time. Uses facial gestures to communicate also ? ?Neurologic: Cranial nerves II through XII grossly intact, motor strength is 5/5 in left and 0/5 RIght deltoid, bicep, tricep, grip, hip flexor, knee extensors, ankle dorsiflexor and plantar flexor ?Trace Right hip add  ?  ?Musculoskeletal: Full range of motion in all 4 extremities. No joint swelling or tenderness. Mildly increased tone in RUE ? ? ? ?Assessment/Plan: ?1. Functional deficits which require 3+ hours per day of interdisciplinary therapy in a comprehensive inpatient rehab setting. ?Physiatrist is providing close team supervision and 24 hour management of active medical problems  listed below. ?Physiatrist and rehab team continue to assess barriers to discharge/monitor patient progress toward functional and medical goals ? ?Care Tool: ? ?Bathing ?   ?Body parts bathed by patient: Face, Chest, Abdomen, Right arm  ? Body parts bathed by helper: Left arm ?  ?  ?Bathing assist Assist Level: Minimal Assistance - Patient > 75% ?  ?  ?Upper Body Dressing/Undressing ?Upper body dressing   ?What is the patient wearing?: Pull over shirt ?   ?Upper body assist Assist Level: Minimal Assistance - Patient > 75% ?   ?Lower Body Dressing/Undressing ?Lower body dressing ? ? ?   ?What is the patient wearing?: Incontinence brief, Pants ? ?  ? ?Lower body assist Assist for lower body dressing: Moderate Assistance - Patient 50 - 74% ?   ? ?Toileting ?Toileting Toileting Activity did not occur (Probation officer and hygiene only): N/A (no void or bm)  ?Toileting assist Assist for toileting: Moderate Assistance - Patient 50 - 74% ?  ?  ?Transfers ?Chair/bed transfer ? ?Transfers assist ?   ? ?Chair/bed transfer assist level: Minimal Assistance - Patient > 75% (squat pivot to MIN A) ?  ?  ?Locomotion ?Ambulation ? ? ?Ambulation assist ? ? Ambulation activity did not occur: Safety/medical concerns ? ?Assist level: 2 helpers (+2 mod A) ?Assistive device: Other (comment) (3  Musketeer) ?Max distance: 15600ft  ? ?Walk 10 feet activity ? ? ?Assist ? Walk 10 feet activity did not occur: Safety/medical concerns ? ?  ?   ? ?Walk 50 feet activity ? ? ?Assist Walk 50 feet with 2 turns activity did not occur: Safety/medical concerns ? ?  ?   ? ? ?Walk 150 feet activity ? ? ?Assist Walk 150 feet activity did not occur: Safety/medical concerns ? ?  ?  ?  ? ?Walk 10 feet on uneven surface  ?activity ? ? ?Assist Walk 10 feet on uneven surfaces activity did not occur: Safety/medical concerns ? ? ?  ?   ? ?Wheelchair ? ? ? ? ?Assist Is the patient using a wheelchair?: Yes (Per PT long-term goals) ?Type of Wheelchair:  Manual ?Wheelchair activity did not occur: Safety/medical concerns ? ?  ?   ? ? ?Wheelchair 50 feet with 2 turns activity ? ? ? ?Assist ? ?  ?Wheelchair 50 feet with 2 turns activity did not occur: Safety/medical concerns ? ? ?   ? ?Wheelchair 150 feet activity  ? ? ? ?Assist ? Wheelchair 150 feet activity did not occur: Safety/medical concerns ? ? ?   ? ?Blood pressure 116/78, pulse 73, temperature 97.7 ?F (36.5 ?C), temperature source Oral, resp. rate 17, height 5\' 8"  (1.727 m), weight 99.6 kg, SpO2 97 %. ? ?Medical Problem List and Plan: ?1. 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            -patient may not shower ?            -ELOS/Goals: 5/19 goal min/mod A ?          -Continue CIR therapies including PT, OT, and SLP , goals Min A to CGA ? -Making progress with SLP with communication ?2.  Antithrombotics: ?-DVT/anticoagulation:  Ph[facility]  [facility] ?5/7 dopplers + for right gastroc/peroneal dvt's- continue current lovenox. Re-image leg 5/12- if there is progressin to popliteal will need DOAC if approved by neurology  ACTIVITY AS TOLERATED- ?-5/10 Dopplers unchanged, Continue current treatment, Consider f/u with neurology ?-5/12 Dr. Pearlean BrownieSethi who cleared patient for DOAC, start Eliquis 5 mg bid  ?-5/13 no signs of bleeding with DOAC, continue current treatment ?            -antiplatelet therapy:  on ASA/Brillinta ?3. Pain Management: Tylenol prn.  ?4. Mood: LCSW to follow for evaluation and support when appropriate.  ?            -antipsychotic agents: N/A ? -melatonin at night seems to have been effective with sleep ?5. Neuropsych: This patient is not capable of making decisions on his own behalf. ?6. Skin/Wound Care: Routine pressure relief measures.  ?7. Fluids/Electrolytes/Nutrition: Following labs as well as ins and outs ?8. Hyponatremia: Stable. Monitor with serial checks.  ?9. Prediabetes: Hgb A1C-6.0. fair control . Discussed healthy diet ?-CBG (last 3)   ?No results for input(s): GLUCAP in the last 72 hours. ?Stopped CBGs due to DC TF  ?10. Dysphagia: Heart healthy/carb mod with thin liquids ?11. Leucocytosis: resolved  ?         ? ?  Latest Ref Rng & Units 03/08/2022  ?  5:28 AM 03/05/2022  ?  5:56 PM 03/01/2022  ?  6:01 AM  ?CBC  ?WBC 4.0 - 10.5 K/uL 5.7   15.4   6.8    ?Hemoglobin 13.0 - 17.0 g/dL 16.114.5   15.6  15.2    ?Hematocrit 39.0 - 52.0 % 41.7   44.4   43.0    ?Platelets 150 - 400 K/uL 184   205   239    ?  ?12. Elevated LDL: continue Lipitor. ?13.  AKI: resolved  ? 5/1- Cr 1.08 and BUN doing well at 18= con't to monitor ? ?  Latest Ref Rng & Units 03/08/2022  ?  5:28 AM 03/01/2022  ?  6:01 AM 02/22/2022  ?  6:09 AM  ?BMP  ?Glucose 70 - 99 mg/dL 125   112   142    ?BUN 6 - 20 mg/dL 12   18   25     ?Creatinine 0.61 - 1.24 mg/dL 1.04   1.08   1.06    ?Sodium 135 - 145 mmol/L 135   137   137    ?Potassium 3.5 - 5.1 mmol/L 3.7   3.7   4.1    ?Chloride 98 - 111 mmol/L 106   105   106    ?CO2 22 - 32 mmol/L 23   24   24     ?Calcium 8.9 - 10.3 mg/dL 8.9   8.8   8.5    ? 5/7 continue to encourage p.o. intake ? 5/13 Appears to be drinking fluids regularly, continue to encourage ? ?14. Hypocalcemia:  Ionized Ca- 1.11 with normal albumin-4.1.  ?--Add calcium supplement bid.  ?15. Elevated Homocysteine levels:  B12, folate WNL--per Dr. Erlinda Hong , neuro, who felt no treatment needed as less than 50.  ?16. Obesity: BMI 35.53- Discussed healthy diet for weight loss. Eating all his meals, not reported to be snacking much.  ? ?  ? ?LOS: ?23 days ?A FACE TO FACE EVALUATION WAS PERFORMED ? ?Jennye Boroughs ?03/13/2022, 11:40 AM  ? ? ? ?

## 2022-03-14 NOTE — Progress Notes (Signed)
?                                                       PROGRESS NOTE ? ? ?Subjective/Complaints: ?Aphasic.  No complaints elicited today.  He nods head and says yeah to most questions. ? ?ROS: limited due to language/communication  ? ?Objective: ?  ?No results found. ?No results for input(s): WBC, HGB, HCT, PLT in the last 72 hours. ? ?No results for input(s): NA, K, CL, CO2, GLUCOSE, BUN, CREATININE, CALCIUM in the last 72 hours. ? ? ? ?Intake/Output Summary (Last 24 hours) at 03/14/2022 0741 ?Last data filed at 03/13/2022 1850 ?Gross per 24 hour  ?Intake 960 ml  ?Output --  ?Net 960 ml  ? ?  ? ?  ? ?Physical Exam: ?Vital Signs ?Blood pressure 103/77, pulse 79, temperature (!) 97.5 ?F (36.4 ?C), temperature source Oral, resp. rate 15, height 5\' 8"  (1.727 m), weight 98.8 kg, SpO2 96 %. ? ?General: No acute distress, sitting in bed ?Mood and affect are appropriate ?Heart: Regular rate and rhythm no rubs murmurs or extra sounds ?Lungs: Clear to auscultation, breathing unlabored, no rales or wheezes ?Abdomen: Positive bowel sounds, soft nontender to palpation, nondistended ?Extremities: No clubbing, cyanosis, or edema.  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  Nods and answers yes to most questions. Sometimes shakes head for no.  Does follow simple commands more than 50% of the time. Uses facial gestures to communicate also ? ?Neurologic: Cranial nerves II through XII grossly intact, motor strength is 5/5 in left and 0/5 RIght deltoid, bicep, tricep, grip, hip flexor, knee extensors, ankle dorsiflexor and plantar flexor ?Trace Right hip add  ?  ?Musculoskeletal: Full range of motion in all 4 extremities. No joint swelling or tenderness. Mildly increased tone in RUE, a little decreased today compared to yesterday ? ? ? ?Assessment/Plan: ?1. Functional deficits which require 3+ hours per day of interdisciplinary therapy in a comprehensive inpatient rehab setting. ?Physiatrist is providing  close team supervision and 24 hour management of active medical problems listed below. ?Physiatrist and rehab team continue to assess barriers to discharge/monitor patient progress toward functional and medical goals ? ?Care Tool: ? ?Bathing ?   ?Body parts bathed by patient: Face, Chest, Abdomen, Right arm  ? Body parts bathed by helper: Left arm ?  ?  ?Bathing assist Assist Level: Minimal Assistance - Patient > 75% ?  ?  ?Upper Body Dressing/Undressing ?Upper body dressing   ?What is the patient wearing?: Pull over shirt ?   ?Upper body assist Assist Level: Minimal Assistance - Patient > 75% ?   ?Lower Body Dressing/Undressing ?Lower body dressing ? ? ?   ?What is the patient wearing?: Incontinence brief, Pants ? ?  ? ?Lower body assist Assist for lower body dressing: Moderate Assistance - Patient 50 - 74% ?   ? ?Toileting ?Toileting Toileting Activity did not occur (Probation officer and hygiene only): N/A (no void or bm)  ?Toileting assist Assist for toileting: Moderate Assistance - Patient 50 - 74% ?  ?  ?Transfers ?Chair/bed transfer ? ?Transfers assist ?   ? ?Chair/bed transfer assist level: Minimal Assistance - Patient > 75% (squat pivot to MIN A) ?  ?  ?Locomotion ?Ambulation ? ? ?Ambulation assist ? ? Ambulation activity did not occur: Safety/medical concerns ? ?  Assist level: 2 helpers (+2 mod A) ?Assistive device: Other (comment) (3 Musketeer) ?Max distance: 168ft  ? ?Walk 10 feet activity ? ? ?Assist ? Walk 10 feet activity did not occur: Safety/medical concerns ? ?  ?   ? ?Walk 50 feet activity ? ? ?Assist Walk 50 feet with 2 turns activity did not occur: Safety/medical concerns ? ?  ?   ? ? ?Walk 150 feet activity ? ? ?Assist Walk 150 feet activity did not occur: Safety/medical concerns ? ?  ?  ?  ? ?Walk 10 feet on uneven surface  ?activity ? ? ?Assist Walk 10 feet on uneven surfaces activity did not occur: Safety/medical concerns ? ? ?  ?   ? ?Wheelchair ? ? ? ? ?Assist Is the patient using a  wheelchair?: Yes (Per PT long-term goals) ?Type of Wheelchair: Manual ?Wheelchair activity did not occur: Safety/medical concerns ? ?  ?   ? ? ?Wheelchair 50 feet with 2 turns activity ? ? ? ?Assist ? ?  ?Wheelchair 50 feet with 2 turns activity did not occur: Safety/medical concerns ? ? ?   ? ?Wheelchair 150 feet activity  ? ? ? ?Assist ? Wheelchair 150 feet activity did not occur: Safety/medical concerns ? ? ?   ? ?Blood pressure 103/77, pulse 79, temperature (!) 97.5 ?F (36.4 ?C), temperature source Oral, resp. rate 15, height 5\' 8"  (1.727 m), weight 98.8 kg, SpO2 96 %. ? ?Medical Problem List and Plan: ?1. 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            -patient may not shower ?            -ELOS/Goals: 5/19 goal min/mod A ?          -Continue CIR therapies including PT, OT, and SLP , goals Min A to CGA ? -Stand Pivot transfers to [facility] assist with PT ?2.  Antithrombotics: ?-DVT/anticoagulation:  Pharmaceutical: Lovenox added as 5 days out from [facility], 30mg  q12 ?5/7 dopplers + for right gastroc/peroneal dvt's- continue current lovenox. Re-image leg 5/12- if there is progressin to popliteal will need DOAC if approved by neurology  ACTIVITY AS TOLERATED- ?-5/10 Dopplers unchanged, Continue current treatment, Consider f/u with neurology ?-5/12 Dr. Leonie Man who cleared patient for DOAC, start Eliquis 5 mg bid  ?-5/13 no signs of bleeding with DOAC, continue current treatment ?            -antiplatelet therapy:  on ASA/Brillinta ?3. Pain Management: Tylenol prn.  ?4. Mood: LCSW to follow for evaluation and support when appropriate.  ?            -antipsychotic agents: N/A ? -melatonin at night seems to have been effective with sleep ?5. Neuropsych: This patient is not capable of making decisions on his own behalf. ?6. Skin/Wound Care: Routine pressure relief measures.  ?7. Fluids/Electrolytes/Nutrition: Following labs as well as ins and outs ?8. Hyponatremia: Stable. Monitor with serial checks.  ?Repeat labs  tomorrow ?9. Prediabetes: Hgb A1C-6.0. fair control . Discussed healthy diet ?-CBG (last 3)  ?No results for input(s): GLUCAP in the last 72 hours. ?Stopped CBGs due to DC TF  ?Monitor on repeat labs tomorrow ?10. Dysphagia: Heart healthy/carb mod with thin liquids ?11. Leucocytosis: resolved  ?         ? ?  Latest Ref Rng & Units 03/08/2022  ?  5:28 AM 03/05/2022  ?  5:56 PM 03/01/2022  ?  6:01 AM  ?CBC  ?WBC  4.0 - 10.5 K/uL 5.7   15.4   6.8    ?Hemoglobin 13.0 - 17.0 g/dL 14.5   15.6   15.2    ?Hematocrit 39.0 - 52.0 % 41.7   44.4   43.0    ?Platelets 150 - 400 K/uL 184   205   239    ?  ?12. Elevated LDL: continue Lipitor. ?13.  AKI: resolved  ? 5/1- Cr 1.08 and BUN doing well at 18= con't to monitor ? ?  Latest Ref Rng & Units 03/08/2022  ?  5:28 AM 03/01/2022  ?  6:01 AM 02/22/2022  ?  6:09 AM  ?BMP  ?Glucose 70 - 99 mg/dL 125   112   142    ?BUN 6 - 20 mg/dL 12   18   25     ?Creatinine 0.61 - 1.24 mg/dL 1.04   1.08   1.06    ?Sodium 135 - 145 mmol/L 135   137   137    ?Potassium 3.5 - 5.1 mmol/L 3.7   3.7   4.1    ?Chloride 98 - 111 mmol/L 106   105   106    ?CO2 22 - 32 mmol/L 23   24   24     ?Calcium 8.9 - 10.3 mg/dL 8.9   8.8   8.5    ? 5/7 continue to encourage p.o. intake ? 5/13 Appears to be drinking fluids regularly, continue to encourage ? Repeat labs ordered ?14. Hypocalcemia:  Ionized Ca- 1.11 with normal albumin-4.1.  ?--Add calcium supplement bid.  ?15. Elevated Homocysteine levels:  B12, folate WNL--per Dr. Erlinda Hong , neuro, who felt no treatment needed as less than 50.  ?16. Obesity: BMI 35.53- Discussed healthy diet for weight loss.  ? ?  ? ?LOS: ?24 days ?A FACE TO FACE EVALUATION WAS PERFORMED ? ?Jennye Boroughs ?03/14/2022, 7:41 AM  ? ? ? ?

## 2022-03-15 ENCOUNTER — Other Ambulatory Visit (HOSPITAL_COMMUNITY): Payer: Self-pay

## 2022-03-15 LAB — CBC
HCT: 43.3 % (ref 39.0–52.0)
Hemoglobin: 15.3 g/dL (ref 13.0–17.0)
MCH: 32.1 pg (ref 26.0–34.0)
MCHC: 35.3 g/dL (ref 30.0–36.0)
MCV: 91 fL (ref 80.0–100.0)
Platelets: 230 10*3/uL (ref 150–400)
RBC: 4.76 MIL/uL (ref 4.22–5.81)
RDW: 12.5 % (ref 11.5–15.5)
WBC: 7.1 10*3/uL (ref 4.0–10.5)
nRBC: 0 % (ref 0.0–0.2)

## 2022-03-15 LAB — BASIC METABOLIC PANEL
Anion gap: 7 (ref 5–15)
BUN: 13 mg/dL (ref 6–20)
CO2: 23 mmol/L (ref 22–32)
Calcium: 8.9 mg/dL (ref 8.9–10.3)
Chloride: 105 mmol/L (ref 98–111)
Creatinine, Ser: 1.12 mg/dL (ref 0.61–1.24)
GFR, Estimated: >60 mL/min (ref >60)
Glucose, Bld: 107 mg/dL — ABNORMAL HIGH (ref 70–99)
Potassium: 3.7 mmol/L (ref 3.5–5.1)
Sodium: 135 mmol/L (ref 135–145)

## 2022-03-15 NOTE — Progress Notes (Signed)
Occupational Therapy Session Note ? ?Patient Details  ?Name: Gordon Fisher ?MRN: [id number] ?Date of Birth: 12-16-1960 ? ?Today's Date: 03/15/2022 ?OT Individual Time: 0703-0800 ?OT Individual Time Calculation (min): 57 min  ? ? ?Short Term Goals: ?Week 3:  OT Short Term Goal 1 (Week 3): pt will complete sit>stand with MIN A+1 wih LRAD ?OT Short Term Goal 2 (Week 3): pt will complete UB bathing with MINA ?OT Short Term Goal 3 (Week 3): pt will complete LBbathing with MOD A +1 ? ?Skilled Therapeutic Interventions/Progress Updates:  ?  Patient received supine in bed, awake and alert.  Skilled therapy session focused on improving participation and decreasing caregiver burden for basic self care skills.  Patient agreeable to shower.  Focused on transitional movements, management of RLE/RUE, and dynamic sitting, static standing balance.  Patient with more automaticity to movements in functional context - e.g. bathing in shower.  Patient able to sit and reach to lower leg, and also to transition to standing with use of grab bar with minimal assistance to contact guard.  Patient transferring L/R with no more than mod assistance, and at times contact guard if feet are in position.  May consider lower wheelchair to allow patient to have feet on floor to adjust self, to reach feet, etc.   ?Patient requires most assistance in dressing RLE, although postural control has improved significantly to allow him to shift forward or to R/L safely while seated.   ?Breakfast tray delivered, set up and assisted patient to eat  breakfast.   ? ? ? ?Therapy Documentation ?Precautions:  ?Precautions ?Precautions: Fall, Other (comment) ?Precaution Comments: R hemi, global aphasia, cortrak ?Restrictions ?Weight Bearing Restrictions: No ? ?Pain: ? No pain ? ? ? ? ? ?Therapy/Group: Individual Therapy ? ?[name] ?03/15/2022, 9:26 AM ?

## 2022-03-15 NOTE — Progress Notes (Signed)
Patient ID: Gordon Fisher, male   DOB: Feb 10, 1961, 61 y.o.   MRN: [id number] ? ?Tub Transfer Bench ordered through [employer]. Per patient spouse she has privately purchased: a steady, bedside commode and shower bench.  ?

## 2022-03-15 NOTE — TOC Benefit Eligibility Note (Signed)
Patient Advocate Encounter ?  ?Insurance verification completed.   ?  ?The patient is currently admitted and upon discharge could be taking ELIQUIS 5MG . ?  ?The current 30 day co-pay is, $0.  ? ?The patient is insured through . ? ? ?   ?

## 2022-03-15 NOTE — Progress Notes (Addendum)
Patient ID: Gordon Fisher, male   DOB: 1961-03-28, 61 y.o.   MRN: [id number] ? ?-[facility] referral sent to [facility]. Patient declined due to no SLP.  ?-HH referral sent to [facility]. Patient declined due to no ALP in the area. ?-HH referral sent to [facility]. Patient declined due to no SLP in the area.  ?-[facility] referral sent to [facility]. Sw waiting on follow up. ?

## 2022-03-15 NOTE — Progress Notes (Signed)
Speech Language Pathology Daily Session Note ? ?Patient Details  ?Name: Gordon Fisher ?MRN: [id number] ?Date of Birth: 07/10/1961 ? ?Today's Date: 03/15/2022 ?SLP Individual Time: 1000-1100 ?SLP Individual Time Calculation (min): 60 min ? ?Short Term Goals: ?Week 3: SLP Short Term Goal 1 (Week 3): Patient will consume current diet with minimal s/s of aspiration with mod I to monitor anterior and clear spillage ?SLP Short Term Goal 1 - Progress (Week 3): Met ?SLP Short Term Goal 2 (Week 3): Patient will clarify responses to yes/no questions via multimodal means using gestures (thumbs up/down) or communication board during 50% of opportunities with sup A verbal cues ?SLP Short Term Goal 2 - Progress (Week 3): Met ?SLP Short Term Goal 3 (Week 3): Patient will answer complex yes/no questions with mod-to-max A to achieve 50% accuracy ?SLP Short Term Goal 3 - Progress (Week 3): Met ?SLP Short Term Goal 4 (Week 3): Patient will identify the appropriate object/picture following a verbal description of 2-3 clues with 70% accuracy given mod A verbal cues ?SLP Short Term Goal 4 - Progress (Week 3): Met ?SLP Short Term Goal 5 (Week 3): Patient will approximate written responses to object naming tasks by writing initial 1-2 letters or fill-in-the blank cueing with max A ?SLP Short Term Goal 5 - Progress (Week 3): Met ?SLP Short Term Goal 6 (Week 3): Pt will approximate/imitate at the sound level achieve 75% accuracy with max A multimodal cues. ?SLP Short Term Goal 6 - Progress (Week 3): Met ? ?Skilled Therapeutic Interventions: Skilled ST treatment focused on language goals. SLP facilitated session outside today per patient's consent. SLP facilitated reading comprehension through phrase completion by selecting target answer in field of 3 written words. Pt achieved 33% accuracy with min A verbal cues progressing to 66% accuracy given mod-to-max A verbal and visual cues. SLP facilitated verbalization tasks by providing max A  mulimodal cues to approximate vowels and initial consonants to achieve ~50% accuracy. 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 Of note, pt was able to approximate /s/ in which he has been unable to elicit prior to today. SLP facilitated automatic speech task counting 1-10. Pt was unsuccessful with verbalizing at the word level, however he successfully approximated initial consonants in "one" (i.e., "wa") and "six" (i.e., "s") with max A multimodal cues and multiple repetitions. Pt elicited his name "Gordon Fisher" with mod fading to min A verbal and visual cues. Pt's attempts to verbalize "Lee" for his spouse "[name]" resulted in perseveration of "Luck." Pt was returned to room at end of session and left in wheelchair with alarm activated and immediate needs within reach at end of session. Continue per current plan of care.   ?   ?Pain ? None/Denied ? ?Therapy/Group: Individual Therapy ? ?Brianne T Garretson ?03/15/2022, 12:49 PM ?

## 2022-03-15 NOTE — Progress Notes (Deleted)
Patient ID: Gordon Fisher, male   DOB: 1961/10/19, 61 y.o.   MRN: [id number] ? ?Patient declined by [facility] due to staffing and no SLP ?

## 2022-03-15 NOTE — Progress Notes (Signed)
Physical Therapy Session Note ? ?Patient Details  ?Name: Gordon Fisher ?MRN: [id number] ?Date of Birth: 02/27/1961 ? ?Today's Date: 03/15/2022 ?PT Individual Time: 4166-0630 ?PT Individual Time Calculation (min): 77 min  ? ?Short Term Goals: ?Week 2:  PT Short Term Goal 1 (Week 2): Pt will perform supine<>sit with min assist ?PT Short Term Goal 1 - Progress (Week 2): Not met ?PT Short Term Goal 2 (Week 2): Pt will perform sit<>stands using LRAD with mod assist  of 1 ?PT Short Term Goal 2 - Progress (Week 2): Met ?PT Short Term Goal 3 (Week 2): Pt will perform bed<>chair transfers using LRAD wtih mod assist of 1 ?PT Short Term Goal 3 - Progress (Week 2): Met ?PT Short Term Goal 4 (Week 2): Pt will ambulate at least 61f using LRAD with max assist of 1 and +2 min assist ?PT Short Term Goal 4 - Progress (Week 2): Not met ?PT Short Term Goal 5 (Week 2): Pt will participate in [place]?PT Short Term Goal 5 - Progress (Week 2): Met ?Week 3:  PT Short Term Goal 1 (Week 3): Pt will complete bed mobility with minA ?PT Short Term Goal 2 (Week 3): Pt will complete bed<>chair transfers with minA and LRAD ?PT Short Term Goal 3 (Week 3): Pt will ambulate 242fwith modA of 1 person ?PT Short Term Goal 4 (Week 3): Pt will be evaluated for custom wheelchair to optimize functional indepdence and safety ? ?Skilled Therapeutic Interventions/Progress Updates:  ?Patient seated upright in TIS w/c on entrance to room. Patient alert and agreeable to PT session. Note left by nursing to toilet pt at start of session. Pt not agreeable to don AFO at start of session.  ? ?Patient with no pain complaint throughout session. ? ?Therapeutic Activity: ?Transfers: Patient performed sit<>stand transfers performed with CGA/ minA for balance on reaching stance. Stand pivot transfers throughout session with Max A to advance RLE and prevent buckling. Squat pivot with Min/ ModA to/ from TIS w/c <> loaner w/c.  Provided verbal cues for  positioning and progression of BLE in order to produce pivot stepping. Toilet transfer performed using safety rail and Min/ ModA. MaxA for clothing mgmt and pericare. ? ?Wheelchair Mobility:  ?Pt's loaner w/c delivered during session this day and tech demonstrates the workings of the w/c  and the breakdown of the w/c x2 for pt and wife. Wife is able to return demonstrate with minimal cueing. Wife is concerned re: weight of w/c and ability to put in car. Tech demos how to remove wheels and this PT explained to wife that removal of all options will allow for decreased weight of actual w/c into manageable pieces. Tech lengthens leg rests and adjust back rest for improved comfort.  ? ?Neuromuscular Re-ed: ?NMR facilitated during session with focus on standing balance, stand pivot stepping. Pt guided in weight shifting, sequencing  to produce adequate positioning in front of w/c. Requires assist with RLE foot progression and increased flexion during stance phase.  NMR performed for improvements in motor control and coordination, balance, sequencing, judgement, and self confidence/ efficacy in performing all aspects of mobility at highest level of independence.  ? ?Patient seated  in new loaner w/c  at end of session with brakes locked, belt alarm set, and all needs within reach. Wife in room. NT notified as to pt's position in new w/c and need for consistent check to ensure pt's comfort. ? ? ?Therapy Documentation ?Precautions:  ?Precautions ?Precautions: Fall, Other (  comment) ?Precaution Comments: R hemi, global aphasia, cortrak ?Restrictions ?Weight Bearing Restrictions: No ?General: ?  ?Vital Signs: ? ?Pain: ? No pain complaint from pt this session.  ? ?Therapy/Group: Individual Therapy ? ?Alger Simons PT, DPT ?03/15/2022, 6:29 PM  ?

## 2022-03-15 NOTE — Progress Notes (Signed)
?                                                       PROGRESS NOTE ? ? ?Subjective/Complaints: ?Aphasic.   ? ?ROS: limited due to language/communication  ? ?Objective: ?  ?No results found. ?Recent Labs  ?  03/15/22 ?0631  ?WBC 7.1  ?HGB 15.3  ?HCT 43.3  ?PLT 230  ? ? ?Recent Labs  ?  03/15/22 ?0631  ?NA 135  ?K 3.7  ?CL 105  ?CO2 23  ?GLUCOSE 107*  ?BUN 13  ?CREATININE 1.12  ?CALCIUM 8.9  ? ? ? ? ?Intake/Output Summary (Last 24 hours) at 03/15/2022 0850 ?Last data filed at 03/14/2022 1847 ?Gross per 24 hour  ?Intake 234 ml  ?Output --  ?Net 234 ml  ? ?  ? ?  ? ?Physical Exam: ?Vital Signs ?Blood pressure 113/82, pulse 78, temperature 98.1 ?F (36.7 ?C), temperature source Oral, resp. rate 17, height 5\' 8"  (1.727 m), weight 98.8 kg, SpO2 98 %. ? ?General: No acute distress, sitting in bed ?Mood and affect are appropriate ?Heart: Regular rate and rhythm no rubs murmurs or extra sounds ?Lungs: Clear to auscultation, breathing unlabored, no rales or wheezes ?Abdomen: Positive bowel sounds, soft nontender to palpation, nondistended ?Extremities: No clubbing, cyanosis, or edema.  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  Nods and answers yes to most questions. Sometimes shakes head for no.  Does follow simple commands more than 50% of the time. Uses facial gestures to communicate also ? ?Neurologic: Cranial nerves II through XII grossly intact, motor strength is 5/5 in left and 0/5 RIght deltoid, bicep, tricep, grip, hip flexor, knee extensors, ankle dorsiflexor and plantar flexor ?Trace Right hip add  ?  ?Musculoskeletal: Full range of motion in all 4 extremities. No joint swelling or tenderness. Mildly increased tone in RUE, a little decreased today compared to yesterday ? ? ? ?Assessment/Plan: ?1. Functional deficits which require 3+ hours per day of interdisciplinary therapy in a comprehensive inpatient rehab setting. ?Physiatrist is providing close team supervision and 24 hour management  of active medical problems listed below. ?Physiatrist and rehab team continue to assess barriers to discharge/monitor patient progress toward functional and medical goals ? ?Care Tool: ? ?Bathing ?   ?Body parts bathed by patient: Face, Chest, Abdomen, Right arm  ? Body parts bathed by helper: Left arm ?  ?  ?Bathing assist Assist Level: Minimal Assistance - Patient > 75% ?  ?  ?Upper Body Dressing/Undressing ?Upper body dressing   ?What is the patient wearing?: Pull over shirt ?   ?Upper body assist Assist Level: Minimal Assistance - Patient > 75% ?   ?Lower Body Dressing/Undressing ?Lower body dressing ? ? ?   ?What is the patient wearing?: Incontinence brief, Pants ? ?  ? ?Lower body assist Assist for lower body dressing: Moderate Assistance - Patient 50 - 74% ?   ? ?Toileting ?Toileting Toileting Activity did not occur (Probation officer and hygiene only): N/A (no void or bm)  ?Toileting assist Assist for toileting: Moderate Assistance - Patient 50 - 74% ?  ?  ?Transfers ?Chair/bed transfer ? ?Transfers assist ?   ? ?Chair/bed transfer assist level: Minimal Assistance - Patient > 75% (squat pivot to MIN A) ?  ?  ?Locomotion ?Ambulation ? ? ?Ambulation assist ? ?  Ambulation activity did not occur: Safety/medical concerns ? ?Assist level: 2 helpers (+2 mod A) ?Assistive device: Other (comment) (3 Musketeer) ?Max distance: 127ft  ? ?Walk 10 feet activity ? ? ?Assist ? Walk 10 feet activity did not occur: Safety/medical concerns ? ?  ?   ? ?Walk 50 feet activity ? ? ?Assist Walk 50 feet with 2 turns activity did not occur: Safety/medical concerns ? ?  ?   ? ? ?Walk 150 feet activity ? ? ?Assist Walk 150 feet activity did not occur: Safety/medical concerns ? ?  ?  ?  ? ?Walk 10 feet on uneven surface  ?activity ? ? ?Assist Walk 10 feet on uneven surfaces activity did not occur: Safety/medical concerns ? ? ?  ?   ? ?Wheelchair ? ? ? ? ?Assist Is the patient using a wheelchair?: Yes (Per PT long-term goals) ?Type of  Wheelchair: Manual ?Wheelchair activity did not occur: Safety/medical concerns ? ?  ?   ? ? ?Wheelchair 50 feet with 2 turns activity ? ? ? ?Assist ? ?  ?Wheelchair 50 feet with 2 turns activity did not occur: Safety/medical concerns ? ? ?   ? ?Wheelchair 150 feet activity  ? ? ? ?Assist ? Wheelchair 150 feet activity did not occur: Safety/medical concerns ? ? ?   ? ?Blood pressure 113/82, pulse 78, temperature 98.1 ?F (36.7 ?C), temperature source Oral, resp. rate 17, height 5\' 8"  (1.727 m), weight 98.8 kg, SpO2 98 %. ? ?Medical Problem List and Plan: ?1. 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            -patient may not shower ?            -ELOS/Goals: 5/19 goal min/mod A ?          -Continue CIR therapies including PT, OT, and SLP , goals Min A to CGA ? -Stand Pivot transfers to [facility] assist with PT ?2.  Antithrombotics: ?-DVT/anticoagulation:  Pharmaceutical: Lovenox added as 5 days out from [facility], 30mg  q12 ?5/7 dopplers + for right gastroc/peroneal dvt's- continue current lovenox. Re-image leg 5/10- no progression -5/10 Dopplers unchanged, Continue current treatment, Consider f/u with neurology- difficult to tell if pt symptomatic given aphsia , will d/w PT ?-5/12 Dr. Leonie Man who cleared patient for DOAC, start Eliquis 5 mg bid  ?-5/13 no signs of bleeding with DOAC, continue current treatment ?            -antiplatelet therapy:  on Brillinta ?3. Pain Management: Tylenol prn.  ?4. Mood: LCSW to follow for evaluation and support when appropriate.  ?            -antipsychotic agents: N/A ? -melatonin at night seems to have been effective with sleep ?5. Neuropsych: This patient is not capable of making decisions on his own behalf. ?6. Skin/Wound Care: Routine pressure relief measures.  ?7. Fluids/Electrolytes/Nutrition: Following labs as well as ins and outs ?8. Hyponatremia: Stable. Monitor with serial checks.  ?Repeat labs tomorrow ?9. Prediabetes: Hgb A1C-6.0. fair control . Discussed healthy  diet ?-CBG (last 3)  ?No results for input(s): GLUCAP in the last 72 hours. ?Stopped CBGs due to DC TF  ?Monitor on repeat labs tomorrow ?10. Dysphagia: Heart healthy/carb mod with thin liquids ?11. Leucocytosis: resolved  ?         ? ?  Latest Ref Rng & Units 03/15/2022  ?  6:31 AM 03/08/2022  ?  5:28 AM 03/05/2022  ?  5:56 PM  ?  CBC  ?WBC 4.0 - 10.5 K/uL 7.1   5.7   15.4    ?Hemoglobin 13.0 - 17.0 g/dL 15.3   14.5   15.6    ?Hematocrit 39.0 - 52.0 % 43.3   41.7   44.4    ?Platelets 150 - 400 K/uL 230   184   205    ?  ?12. Elevated LDL: continue Lipitor. ?13.  AKI: resolved  ? 5/1- Cr 1.08 and BUN doing well at 18= con't to monitor ? ?  Latest Ref Rng & Units 03/15/2022  ?  6:31 AM 03/08/2022  ?  5:28 AM 03/01/2022  ?  6:01 AM  ?BMP  ?Glucose 70 - 99 mg/dL 107   125   112    ?BUN 6 - 20 mg/dL 13   12   18     ?Creatinine 0.61 - 1.24 mg/dL 1.12   1.04   1.08    ?Sodium 135 - 145 mmol/L 135   135   137    ?Potassium 3.5 - 5.1 mmol/L 3.7   3.7   3.7    ?Chloride 98 - 111 mmol/L 105   106   105    ?CO2 22 - 32 mmol/L 23   23   24     ?Calcium 8.9 - 10.3 mg/dL 8.9   8.9   8.8    ? 5/7 continue to encourage p.o. intake ? 5/13 Appears to be drinking fluids regularly, continue to encourage ? Repeat labs ordered ?14. Hypocalcemia:  Ionized Ca- 1.11 with normal albumin-4.1.  ?--Add calcium supplement bid.  ?15. Elevated Homocysteine levels:  B12, folate WNL--per Dr. Erlinda Hong , neuro, who felt no treatment needed as less than 50.  ?16. Obesity: BMI 35.53- Discussed healthy diet for weight loss.  ? ?  ? ?LOS: ?25 days ?A FACE TO FACE EVALUATION WAS PERFORMED ? ?Luanna Salk Tierre Gerard ?03/15/2022, 8:50 AM  ? ? ? ?

## 2022-03-16 NOTE — Progress Notes (Signed)
Patient ID: Gordon Fisher, male   DOB: 1960/11/07, 61 y.o.   MRN: [id number] ? ?Hospital bed ordered through [employer]  ?

## 2022-03-16 NOTE — Progress Notes (Signed)
Physical Therapy Weekly Progress Note ? ?Patient Details  ?Name: Gordon Fisher ?MRN: [id number] ?Date of Birth: 05/11/1961 ? ?Beginning of progress report period: Mar 08, 2022 ?End of progress report period: Mar 16, 2022 ? ?Today's Date: 03/16/2022 ?PT Individual Time: 4034-7425 ?PT Individual Time Calculation (min): 58 min  ? ?Patient has met 3 of 4 short term goals.  Mr. Hewes continues to make steady progress; however, based on ELOS his LTGs were downgraded to min assist overall at wheelchair level as pt will not reach a safe functional ambulatory level by current D/C date of 5/19. Pt is demonstrates significant improvements in his motor planning and receptive aphasia resulting in increased independence with functional mobility. He is performing supine<>sit with min assist using hospital bed features, sit<>stands with min assist, and squat pivot transfers with min assist. He is continuing to participate in gait training ambulating ~50-166ft per trial with +2 mod assist for balance and R LE management - he demonstrates improved stance phase stability at the knee but has hip instability and continues to require max/total assist to advance R LE during swing. Custom manual wheelchair evaluation was completed and pt has received his loaner wheelchair. Pt will benefit from continued CIR level therapies to further advance his independence with functional mobility until D/Cing home with 24hr support from family.  ? ?Patient continues to demonstrate the following deficits muscle weakness and muscle paralysis, decreased cardiorespiratoy endurance, impaired timing and sequencing, abnormal tone, unbalanced muscle activation, motor apraxia, and decreased motor planning, decreased midline orientation and decreased attention to right, decreased attention, decreased awareness, decreased problem solving, decreased safety awareness, decreased memory, and delayed processing, and decreased sitting balance, decreased standing balance,  decreased postural control, hemiplegia, and decreased balance strategies and therefore will continue to benefit from skilled PT intervention to increase functional independence with mobility. ? ?Patient not progressing toward long term goals.  See goal revision..  Continue plan of care. ? ?PT Short Term Goals ?Week 3:  PT Short Term Goal 1 (Week 3): Pt will complete bed mobility with minA ?PT Short Term Goal 1 - Progress (Week 3): Met ?PT Short Term Goal 2 (Week 3): Pt will complete bed<>chair transfers with minA and LRAD ?PT Short Term Goal 2 - Progress (Week 3): Met ?PT Short Term Goal 3 (Week 3): Pt will ambulate 58ft with modA of 1 person ?PT Short Term Goal 3 - Progress (Week 3): Progressing toward goal ?PT Short Term Goal 4 (Week 3): Pt will be evaluated for custom wheelchair to optimize functional indepdence and safety ?PT Short Term Goal 4 - Progress (Week 3): Met ?Week 4:  PT Short Term Goal 1 (Week 4): = to LTGs based on ELOS ? ?Skilled Therapeutic Interventions/Progress Updates:  ?Ambulation/gait training;Discharge planning;DME/adaptive equipment instruction;Functional mobility training;Therapeutic Activities;UE/LE Strength taining/ROM;Visual/perceptual remediation/compensation;Balance/vestibular training;Community reintegration;Neuromuscular re-education;Patient/family education;Stair training;Therapeutic Exercise;UE/LE Coordination activities;Wheelchair propulsion/positioning;Cognitive remediation/compensation;Disease management/prevention;Splinting/orthotics;Psychosocial support;Pain management;Skin care/wound management;Functional electrical stimulation  ? ?Pt received sitting in loaner w/c with his wife, Rosendo Gros, Richrd Prime, and daughter and granddaughter present for family education/training. Pt's wife reports the loaner wheelchair is easier to manage than the TIS w/c. Planning to perform real-life car transfer again tomorrow in preparation for D/C with focus today on squat pivot transfers  w/c<>EOB and bed mobility. ? ?Pt's wife confirms she will be receiving recommended hospital bed tomorrow as pt requires features of hospital bed to perform safe bed<>chair transfers as well as increase independence with bed mobility using hospital bed features. ? ?Therapist reviewed how to set-up, manage  w/c parts, and safely assist pt with squat pivot transfers w/c<>EOB.  Pt performed R squat pivot w/c>EOB with CGA/light min assist for steadying while rotating as well as ensuring proper positioning of R LE. Supine<>sit using bedrail with light min assist for R hemibody management and bringing trunk up when returning to sit. L squat pivot EOB>w/c with CGA for safety while scooting - pt continues to have increased independence with transfers towards L compared to R. ? ?Pt performed block practice squat pivot transfers w/c<>EOB as described above with pt's wife providing the hands-on assistance and therapist providing max cuing faded to intermittent cuing, for proper positioning of pt's wife to provide assistance, with continued repetitions. Pt's wife reports feeling "awkward" during the transfers because this is an unfamiliar movement pattern for her; however, continues to demonstrate improvement in her body mechanics and positioning with cuing. Pt continues to have increased difficulty transferring towards R compared to L. Also, noticed pt with tendency to roll R ankle during the transfers and would benefit from an ankle brace or AFO to improve joint alignment to promote WBing and protect his joint from injury. ? ?Pt performed block practice supine<>sit with pt's wife providing the min assist for R hemibody management in both directions and bringing trunk upright when returning to sit EOB - therapist providing max cuing for sequencing progressed to intermittent cuing.  ? ?Throughout session pt's wife managing w/c parts for practice requiring min/mod cuing. ? ?Pt's wife reports feeling more confident with the  transfers today but will benefit from continued hands-on training.  ? ?SLP arrived and facilitated improved communication with pt to determine why he doesn't want to wear the R LE AFO - deduced that pt does not feel that he needs it - therapist provided visual demonstration and education on why the brace is needed and pt appears to possibly be agreeable to wearing a brace. Will continue to assess tomorrow. ? ?Pt left seated in w/c with needs in reach, family present, and SLP present to assume care of patient. ? ?Therapy Documentation ?Precautions:  ?Precautions ?Precautions: Fall, Other (comment) ?Precaution Comments: R hemi, global aphasia, cortrak ?Restrictions ?Weight Bearing Restrictions: No ? ? ?Pain: ?No indication of pain throughout session. ? ? ?Therapy/Group: Individual Therapy ? ?Tawana Scale , PT, DPT, NCS, CSRS ?03/16/2022, 12:25 PM  ?

## 2022-03-16 NOTE — Progress Notes (Signed)
Speech Language Pathology Daily Session Note ? ?Patient Details  ?Name: Gordon Fisher ?MRN: [id number] ?Date of Birth: 1961-06-03 ? ?Today's Date: 03/16/2022 ?SLP Individual Time: 2595-6387 and [phone] ?SLP Individual Time Calculation (min): 32 min and 41 minutes ? ?Short Term Goals: ?Week 4: SLP Short Term Goal 1 (Week 4): STG=LTG due to ELOS ? ?Skilled Therapeutic Interventions:  ?Session 1: Skilled ST treatment focused on communication goals. Pt received upright in wheelchair on arrival. Pt communicated functional needs with mod A for use of multimodal communication including gestures, head nods, and limited written communication at the word level. Pt continues to respond best to yes/no questions with sup A verbal cues to ensure clarity (direct head nod/shake, verbal response "yes/no"). SLP provided mod A multimodal cues for production of vowels and consonants in isolation. Pt continues to excel with most vowels and nasal (/m/, /n/), bilabial (/m/, /w/), and velar (/k/) consonants. Pt benefits from starting with vowel production while progressing to consonants. Pt still exhibits tendency to perseverate with consonants with limited ability to repair. SLP facilitated consonant + vowel (CV) production and pt successfully produced "ma", "me", "la" with max A multimodal cues and extensive attempts. Pt unsuccessful with all other attempts. Pt eventually began to produce /k/ in word final position with all subsequent CV attempts (e.g., "luck"). Pt/SLP transitioned into written communication. Pt wrote his first name with written letter cue x1 to initiate; wrote last name at ind level; wrote spouse's name with spelling error x1. Pt responded to open ended questions via written response with 85% spelling accuracy and without cues to initiate (e.g., what's your favorite drink + food - pt responded "water" and "chiken"). Patient was left in wheelchair with alarm activated and immediate needs within reach at end of session.  Continue per current plan of care.   ?   ?Session 2: Skilled ST treatment focused on family education with spouse, sister-in-law, and daughter. Upon arrival, PT was working with pt and family and providing education on ankle brace. SLP provided mod A using multimodal communication to better understand pt's preferences and opinions re: brace through selecting written stimuli in phrase and sentence format. Pt selected responses with minimal wait time and confirmed responses with follow up verbal "yeah" or "no." SLP provided verbal, demonstration, and written education re: multimodal communication through verbal responses, written expression, low tech communication systems, gestures, head nods, and selecting field of written choices. Educated on apraxia vs. aphasia with handout. Discussed recommendations regarding follow up therapy in either [facility] or OP settings. Per SW report, acknowledged there may not be [facility] SLP services available in pt's area at discharge however family inquired about HH agencies close to [place] border including [place], [place], and [place], [place]. SLP sent secure epic chat to SW. SLP also educated ways to facilitate functional communication at home and ideas for HEP in the event pt is unable to receive HHSLP services right away. Family verbalized and demonstrated understanding. Patient was left in chair with alarm activated and immediate needs within reach at end of session. Continue per current plan of care.   ? ?Pain ?Pain Assessment ?Pain Scale: 0-10 ?Pain Score: 0-No pain ? ?Therapy/Group: Individual Therapy ? ?Malikye Reppond T Devynn Hessler ?03/16/2022, 12:05 PM ?

## 2022-03-16 NOTE — Progress Notes (Signed)
Spoke with patient, wife and sister at bedside during Nursing patient/family education. Nurse answered questions about medications including blood thinners, eliquis, foods to avoid and that are ok to take. Spoke with family about what to expect the day of discharge, discussed heart healthy and carb modified food options. Discussed tips on ways to transfer patient and help boost in bed once home. Patient and family satisfied with session, all questions answered at the moment, family will contact nursing if other questions arise before discharge. ?

## 2022-03-16 NOTE — Progress Notes (Signed)
Occupational Therapy Session Note ? ?Patient Details  ?Name: Gordon Fisher ?MRN: [id number] ?Date of Birth: 1961/06/13 ? ?Today's Date: 03/16/2022 ?OT Individual Time: 1000-1045:  1302-1400 ?OT Individual Time Calculation (min):   45 min:  58 min  ? ? ?Short Term Goals: ?Week 3:  OT Short Term Goal 1 (Week 3): pt will complete sit>stand with MIN A+1 wih LRAD ?OT Short Term Goal 2 (Week 3): pt will complete UB bathing with MINA ?OT Short Term Goal 3 (Week 3): pt will complete LBbathing with MOD A +1 ? ?Skilled Therapeutic Interventions/Progress Updates:  ?  AM session:   Patient received supine in bed.  Patient awake and alert.  Patient agreeable to getting up and dressed, but declined taking a shower (showered yesterday)  Skilled OT intervention to address compensatory strategies to increase participation with ADL, and postural control in sitting and in standing.  Patient dressed self sitting on edge of bed.  Patient with one rather fast weight shift where he lost his balance and fell to the right on his bed.  Patient able to return to sitting from this position without physical intervention, using footboard on bed and left hand as well as trunk muscles.  At this point, patient's biggest challenge is managing RLE.  Worked on various positions that made RLE more accessible for dressing, e.g. propping foot on foot stoll to keep toes free - easier to thread pants or socks on.  Patient issues elastic shoelaces to allow him to be able to put Left shoe on without assistance.  Patient left up in wheelchair with safety belt in place and engaged.  Call bell and personal items in reach.   ? ?  PM Session:  Patient's wife here for family education.  She has been observing and participating in several OT sessions thus far.  First reviewed flow of basic self care once he gets home.  Then reviewed compensatory strategies to assist with management of RUE/RLE.  Highlighted specific strategies to don pull over shirt, as well as  pants.  Completed actual tub and shower transfers with [facility]. Wife reports having obtained Stedy, tub/shower bench and commode at home.  Did best to simulate home environment.  Demonstrated PROM exercises for RUE - Family video'd for better carryover to home.  All questions asked and answered.  Patient left up in wheelchair with multiple family members present.   ? ?Therapy Documentation ?Precautions:  ?Precautions ?Precautions: Fall, Other (comment) ?Precaution Comments: R hemi, global aphasia, cortrak ?Restrictions ?Weight Bearing Restrictions: No ? ?  ?Pain: ?Pain Assessment ?Pain Scale: 0-10 ?Pain Score: 0-No pain ? ? ? ?Therapy/Group: Individual Therapy ? ?[name] ?03/16/2022, 3:27 PM ?

## 2022-03-16 NOTE — Addendum Note (Signed)
Encounter addended by: Andee Lineman A on: 03/16/2022 5:15 PM ? Actions taken: Imaging Exam ended

## 2022-03-16 NOTE — Plan of Care (Signed)
?Problem: RH Ambulation ?Goal: LTG Patient will ambulate in home environment (PT) ?Description: LTG: Patient will ambulate in home environment, # of feet with assistance (PT). ?Outcome: Not Met (add Reason) ?Flowsheets (Taken 03/16/2022 2152) ?LTG: Pt will ambulate in home environ  assist needed:: (D/C goal as pt will not be a functional ambulator upon D/C based on ELOS) -- ?Note: D/C goal as pt will not be a functional ambulator upon D/C based on ELOS ?  ?Problem: RH Stairs ?Goal: LTG Patient will ambulate up and down stairs w/assist (PT) ?Description: LTG: Patient will ambulate up and down # of stairs with assistance (PT) ?Outcome: Not Applicable ?Flowsheets (Taken 03/16/2022 2152) ?LTG: Pt will ambulate up/down stairs assist needed:: (D/C goal as pt now has ramped entrance into the home and cannot safely navigate stairs) -- ?Note: D/C goal as pt now has ramped entrance into the home and cannot safely navigate stairs ?  ?Problem: RH Balance ?Goal: LTG Patient will maintain dynamic sitting balance (PT) ?Description: LTG:  Patient will maintain dynamic sitting balance with assistance during mobility activities (PT) ?Flowsheets (Taken 03/16/2022 2152) ?LTG: Pt will maintain dynamic sitting balance during mobility activities with:: (downgraded based on pt's progress and ELOS) Contact Guard/Touching assist ?Note: downgraded based on pt's progress and ELOS ?Goal: LTG Patient will maintain dynamic standing balance (PT) ?Description: LTG:  Patient will maintain dynamic standing balance with assistance during mobility activities (PT) ?Flowsheets (Taken 03/16/2022 2152) ?LTG: Pt will maintain dynamic standing balance during mobility activities with:: (downgraded based on pt's progress and ELOS) Moderate Assistance - Patient 50 - 74% ?Note: downgraded based on pt's progress and ELOS ?  ?Problem: Sit to Stand ?Goal: LTG:  Patient will perform sit to stand with assistance level (PT) ?Description: LTG:  Patient will perform sit to  stand with assistance level (PT) ?Flowsheets (Taken 03/16/2022 2152) ?LTG: PT will perform sit to stand in preparation for functional mobility with assistance level: (downgraded based on pt's progress and ELOS) Minimal Assistance - Patient > 75% ?Note: downgraded based on pt's progress and ELOS ?  ?Problem: RH Bed Mobility ?Goal: LTG Patient will perform bed mobility with assist (PT) ?Description: LTG: Patient will perform bed mobility with assistance, with/without cues (PT). ?Flowsheets (Taken 03/16/2022 2152) ?LTG: Pt will perform bed mobility with assistance level of: (downgraded based on pt's progress and ELOS) Minimal Assistance - Patient > 75% ?Note: downgraded based on pt's progress and ELOS ?  ?Problem: RH Bed to Chair Transfers ?Goal: LTG Patient will perform bed/chair transfers w/assist (PT) ?Description: LTG: Patient will perform bed to chair transfers with assistance (PT). ?Flowsheets (Taken 03/16/2022 2152) ?LTG: Pt will perform Bed to Chair Transfers with assistance level: (downgraded based on pt's progress and ELOS) Minimal Assistance - Patient > 75% ?Note: downgraded based on pt's progress and ELOS ?  ?Problem: RH Car Transfers ?Goal: LTG Patient will perform car transfers with assist (PT) ?Description: LTG: Patient will perform car transfers with assistance (PT). ?Flowsheets (Taken 03/16/2022 2152) ?LTG: Pt will perform car transfers with assist:: (downgraded based on pt's progress and ELOS) Minimal Assistance - Patient > 75% ?Note: downgraded based on pt's progress and ELOS ?  ?Problem: RH Furniture Transfers ?Goal: LTG Patient will perform furniture transfers w/assist (OT/PT) ?Description: LTG: Patient will perform furniture transfers  with assistance (OT/PT). ?Flowsheets (Taken 03/16/2022 2152) ?LTG: Pt will perform furniture transfers with assist:: (downgraded based on pt's progress and ELOS) Minimal Assistance - Patient > 75% ?Note: downgraded based on pt's progress and ELOS ?  ?  Problem: RH  Ambulation ?Goal: LTG Patient will ambulate in controlled environment (PT) ?Description: LTG: Patient will ambulate in a controlled environment, # of feet with assistance (PT). ?Flowsheets (Taken 03/16/2022 2152) ?LTG: Pt will ambulate in controlled environ  assist needed:: (downgraded based on pt's progress and ELOS) Moderate Assistance - Patient 50 - 74% ?LTG: Ambulation distance in controlled environment: 60f using LRAD with assistance by therapist for gait training purposes ?Note: downgraded based on pt's progress and ELOS ?  ?

## 2022-03-16 NOTE — Progress Notes (Signed)
?                                                       PROGRESS NOTE ? ? ?Subjective/Complaints: ?Aphasic.   ?Labs reviewed  ?ROS: limited due to language/communication  ? ?Objective: ?  ?No results found. ?Recent Labs  ?  03/15/22 ?0631  ?WBC 7.1  ?HGB 15.3  ?HCT 43.3  ?PLT 230  ? ? ?Recent Labs  ?  03/15/22 ?0631  ?NA 135  ?K 3.7  ?CL 105  ?CO2 23  ?GLUCOSE 107*  ?BUN 13  ?CREATININE 1.12  ?CALCIUM 8.9  ? ? ? ? ?Intake/Output Summary (Last 24 hours) at 03/16/2022 0748 ?Last data filed at 03/16/2022 0000 ?Gross per 24 hour  ?Intake 940 ml  ?Output 0 ml  ?Net 940 ml  ? ?  ? ?  ? ?Physical Exam: ?Vital Signs ?Blood pressure 116/80, pulse 77, temperature 97.7 ?F (36.5 ?C), resp. rate 15, height 5\' 8"  (1.727 m), weight 100.8 kg, SpO2 95 %. ? ?General: No acute distress, sitting in bed ?Mood and affect are appropriate ?Heart: Regular rate and rhythm no rubs murmurs or extra sounds ?Lungs: Clear to auscultation, breathing unlabored, no rales or wheezes ?Abdomen: Positive bowel sounds, soft nontender to palpation, nondistended ?Extremities: No clubbing, cyanosis, or edema.  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  Nods and answers yes to most questions. Sometimes shakes head for no.  Does follow simple commands more than 50% of the time. Uses facial gestures to communicate also ? ?Neurologic: Cranial nerves II through XII grossly intact, motor strength is 5/5 in left and 0/5 RIght deltoid, bicep, tricep, grip, hip flexor, knee extensors, ankle dorsiflexor and plantar flexor ?Trace Right hip add  ?  ?Musculoskeletal: Full range of motion in all 4 extremities. No joint swelling or tenderness. Mildly increased tone in RUE, a little decreased today compared to yesterday ? ? ? ?Assessment/Plan: ?1. Functional deficits which require 3+ hours per day of interdisciplinary therapy in a comprehensive inpatient rehab setting. ?Physiatrist is providing close team supervision and 24 hour management of  active medical problems listed below. ?Physiatrist and rehab team continue to assess barriers to discharge/monitor patient progress toward functional and medical goals ? ?Care Tool: ? ?Bathing ?   ?Body parts bathed by patient: Chest, Abdomen, Face, Front perineal area, Right upper leg, Left upper leg, Right lower leg, Left lower leg, Right arm  ? Body parts bathed by helper: Left arm, Buttocks ?  ?  ?Bathing assist Assist Level: Minimal Assistance - Patient > 75% ?  ?  ?Upper Body Dressing/Undressing ?Upper body dressing   ?What is the patient wearing?: Pull over shirt ?   ?Upper body assist Assist Level: Minimal Assistance - Patient > 75% ?   ?Lower Body Dressing/Undressing ?Lower body dressing ? ? ?   ?What is the patient wearing?: Incontinence brief, Pants ? ?  ? ?Lower body assist Assist for lower body dressing: Moderate Assistance - Patient 50 - 74% ?   ? ?Toileting ?Toileting Toileting Activity did not occur (Probation officer and hygiene only): N/A (no void or bm)  ?Toileting assist Assist for toileting: Moderate Assistance - Patient 50 - 74% ?  ?  ?Transfers ?Chair/bed transfer ? ?Transfers assist ?   ? ?Chair/bed transfer assist level: Minimal Assistance - Patient >  75% ?  ?  ?Locomotion ?Ambulation ? ? ?Ambulation assist ? ? Ambulation activity did not occur: Safety/medical concerns ? ?Assist level: 2 helpers (+2 mod A) ?Assistive device: Other (comment) (3 Musketeer) ?Max distance: 167ft  ? ?Walk 10 feet activity ? ? ?Assist ? Walk 10 feet activity did not occur: Safety/medical concerns ? ?  ?   ? ?Walk 50 feet activity ? ? ?Assist Walk 50 feet with 2 turns activity did not occur: Safety/medical concerns ? ?  ?   ? ? ?Walk 150 feet activity ? ? ?Assist Walk 150 feet activity did not occur: Safety/medical concerns ? ?  ?  ?  ? ?Walk 10 feet on uneven surface  ?activity ? ? ?Assist Walk 10 feet on uneven surfaces activity did not occur: Safety/medical concerns ? ? ?  ?   ? ?Wheelchair ? ? ? ? ?Assist Is  the patient using a wheelchair?: Yes (Per PT long-term goals) ?Type of Wheelchair: Manual ?Wheelchair activity did not occur: Safety/medical concerns ? ?  ?   ? ? ?Wheelchair 50 feet with 2 turns activity ? ? ? ?Assist ? ?  ?Wheelchair 50 feet with 2 turns activity did not occur: Safety/medical concerns ? ? ?   ? ?Wheelchair 150 feet activity  ? ? ? ?Assist ? Wheelchair 150 feet activity did not occur: Safety/medical concerns ? ? ?   ? ?Blood pressure 116/80, pulse 77, temperature 97.7 ?F (36.5 ?C), resp. rate 15, height 5\' 8"  (1.727 m), weight 100.8 kg, SpO2 95 %. ? ?Medical Problem List and Plan: ?1. 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            -patient may not shower ?            -ELOS/Goals: 5/19 goal min/mod A team conf in am  ?          -Continue CIR therapies including PT, OT, and SLP , goals Min A to CGA ? -Stand Pivot transfers to [facility] at [facility] assist with PT ?2.  Antithrombotics: ?-DVT/anticoagulation:  Pharmaceutical: Lovenox added as 5 days out from [facility], 30mg  q12 ?5/7 dopplers + for right gastroc/peroneal dvt's- -5/12 Dr. Leonie Man who cleared patient for DOAC, start Eliquis 5 mg bid  ?-5/13 no signs of bleeding with DOAC,3 mo tx unless bleeding issues  ?            -antiplatelet therapy:  on Brillinta ?3. Pain Management: Tylenol prn.  ?4. Mood: LCSW to follow for evaluation and support when appropriate.  ?            -antipsychotic agents: N/A ? -melatonin at night seems to have been effective with sleep ?5. Neuropsych: This patient is not capable of making decisions on his own behalf. ?6. Skin/Wound Care: Routine pressure relief measures.  ?7. Fluids/Electrolytes/Nutrition: Following labs as well as ins and outs ?8. Hyponatremia: Stable. Monitor with serial checks.  ?Repeat labs tomorrow ?9. Prediabetes: Hgb A1C-6.0. fair control . Discussed healthy diet ?-CBG (last 3)  ?No results for input(s): GLUCAP in the last 72 hours. ?Stopped CBGs due to DC TF  ?Monitor on repeat labs tomorrow ?10.  Dysphagia: Heart healthy/carb mod with thin liquids ?11. Leucocytosis: resolved  ?         ? ?  Latest Ref Rng & Units 03/15/2022  ?  6:31 AM 03/08/2022  ?  5:28 AM 03/05/2022  ?  5:56 PM  ?CBC  ?WBC 4.0 - 10.5 K/uL 7.1   5.7  15.4    ?Hemoglobin 13.0 - 17.0 g/dL 15.3   14.5   15.6    ?Hematocrit 39.0 - 52.0 % 43.3   41.7   44.4    ?Platelets 150 - 400 K/uL 230   184   205    ?  ?12. Elevated LDL: continue Lipitor. ?13.  AKI: resolved  ? 5/1- Cr 1.08 and BUN doing well at 18= con't to monitor ? ?  Latest Ref Rng & Units 03/15/2022  ?  6:31 AM 03/08/2022  ?  5:28 AM 03/01/2022  ?  6:01 AM  ?BMP  ?Glucose 70 - 99 mg/dL 107   125   112    ?BUN 6 - 20 mg/dL 13   12   18     ?Creatinine 0.61 - 1.24 mg/dL 1.12   1.04   1.08    ?Sodium 135 - 145 mmol/L 135   135   137    ?Potassium 3.5 - 5.1 mmol/L 3.7   3.7   3.7    ?Chloride 98 - 111 mmol/L 105   106   105    ?CO2 22 - 32 mmol/L 23   23   24     ?Calcium 8.9 - 10.3 mg/dL 8.9   8.9   8.8    ?  ?14. Hypocalcemia:  Ionized Ca- 1.11 with normal albumin-4.1.  ?--Add calcium supplement bid.  ?15. Elevated Homocysteine levels:  B12, folate WNL--per Dr. Erlinda Hong , neuro, who felt no treatment needed as less than 50.  ?16. Obesity: BMI 35.53- Discussed healthy diet for weight loss.  ? ?  ? ?LOS: ?26 days ?A FACE TO FACE EVALUATION WAS PERFORMED ? ?Luanna Salk Ireanna Finlayson ?03/16/2022, 7:48 AM  ? ? ? ?

## 2022-03-17 NOTE — Progress Notes (Signed)
Physical Therapy Session Note  Patient Details  Name: Gordon Fisher MRN: [id number] Date of Birth: September 17, 1961  Today's Date: 03/18/2022 PT Individual Time: [id number] and [id number]   PT Individual Time Calculation (min): 58 min and 60 min  Short Term Goals: Week 4:  PT Short Term Goal 1 (Week 4): = to LTGs based on ELOS  Skilled Therapeutic Interventions/Progress Updates:    Session 1: Pt received sitting in loaner w/c and agreeable to therapy session - pt with improving consistency with yes/no responses to questions. Pt agreeable to wearing R LE Ott[place]ock Walk-on PLS AFO for gait training - therapist donned it and shoe total assist. Pt continues to lack active movement in open chain in R LE. Donned R LE leg lifter to be utilized for swing phase advancement during gait.  Sit<>stands with CGA/light min assist of 1 to come to standing - assist to ensure proper R LE positioning prior to initiating the transfer.  Gait training the following trials as described using +2 assist: 1st trial:  54ft with B HHA (+2 providing forearm support and R UE around therapist's shoulders) with +2 light mod assist for balance and R LE management - continues to require max/total assist to advance R LE during swing as well as mod cuing to ensure full L weight shift at hips, which pt is demonstrating improvement in this motor plan but does continue to attempt to compensate by extending his trunk back towards L when trying to advance R LE  - therapist continuing to provide R hip stability during stance phase to avoid R hip drop, which allows pt to perform reciprocal step with L LE 2nd trial: 45ft using Eva walker to attempt progression towards AD and provide increased UE support for balance - requires +2 assist to manage AD for pt safety - continues to require the above assist for R LE management during swing and stance phase  3rd trial: 46ft starting with [place] walker but then had to have 3rd person come and take away the  AD because pt started to regress to his previous poor motor plan of having excessive R anterior trunk lean during L stance phase and when trying to advance R LE making pt unsafe therefore transitioned back to B HHA as described during 1st trial  4th trial: 58ft with 3 Musketeer support (now having shorter person provide +2 assist requiring change in hand positioning) with continued +2 light mod assist for balance and R LE management as described in 1st gait trial - pt able to turn and sit in w/c at end of the gait with increased assist for balance and facilitation for weight shifting to allow stepping back to sequencing the transfer.  At end of session, pt left seated in w/c with needs in reach, RUE supported on padded arm trough, and seat belt alarm on.   Session 2: Pt received sitting in w/c with his sister-in-law, Richrd Prime, present and Pam, PA present. Pt agreeable to therapy session therefore therapist assumed care of patient with plan to participate in real-life car transfer training again today with his wife, [name]. Pt already wearing R LE Ottobock Walk-on PLS AFO - donned leg lifter to utilize during car transfer. Transported to/from main entrance of hospital in w/c for time management and energy conservation.  Therapist performed x2 stand pivot car transfers for demonstration and education on how to safely assist pt, then pt's wife performed 4x reps of block practice training. Details on car transfer training: Entering vehicle: increased ease  of this transfer because going towards L, cuing pt's wife to monitor her foot placement to allow pt to step L LE laterally towards vehicle, pt's wife able to use leg lifter to bring R LE back towards vehicle when he is turning, pt able to use L UE on leg lifter to assist his wife in bringing his R LE into the vehicle Exiting vehicle: pt rotates hips and gets feet on the ground with wife assisting with R LE management out of vehicle and she properly positions the  R LE towards w/c prior to pt standing, pt's wife then uses leg lifter to manually step his R LE out and towards w/c prior to pt stepping his L LE back and then sitting down  Pt's wife also participated in breaking down and placing w/c in/out of the trunk to ensure safety with this and ensure accurate recall of how to properly manage w/c parts - min cuing for technique to increase ease of this task.   Pt's wife reports her preference would be to receive follow-up HH PT and OT despite inability to receive [facility] SLP services from the companies that service their area rather than going directly to outpatient therapies due to her concerns with mobilizing the pt in the home.   At end of session, pt left seated in w/c with needs in reach and in the care of his wife.   Therapy Documentation Precautions:  Precautions Precautions: Fall, Other (comment) Precaution Comments: R hemi, global aphasia, cortrak Restrictions Weight Bearing Restrictions: No   Pain:  Session 1: No indications of pain throughout session.  Session 2: No indications of pain throughout session.    Therapy/Group: Individual Therapy  Tawana Scale , PT, DPT, NCS, CSRS 03/17/2022, 7:55 AM

## 2022-03-17 NOTE — Progress Notes (Signed)
Occupational Therapy Session Note ? ?Patient Details  ?Name: Gordon Fisher ?MRN: [id number] ?Date of Birth: 04/17/1961 ? ?Today's Date: 03/17/2022 ?OT Individual Time: 5465-0354 ?OT Individual Time Calculation (min): 45 min  ? ? ?Short Term Goals: ?Week 3:  OT Short Term Goal 1 (Week 3): pt will complete sit>stand with MIN A+1 wih LRAD ?OT Short Term Goal 2 (Week 3): pt will complete UB bathing with MINA ?OT Short Term Goal 3 (Week 3): pt will complete LBbathing with MOD A +1 ? ?Skilled Therapeutic Interventions/Progress Updates:  ?  Family education session completed with pt's wife and sister in law. They completed hands on training of shower transfers and bed transfers using the stedy. Provided education on fall risk reduction, caregiver body mechanics and safety, and strategies to increase pt independence with ADLs. Cueing provided to pt's wife on her body mechanics and set up of transfers to increase ease and safety. Pt able to complete several sit <> stands in the stedy with assist only to place his RLE on the foot board. He is also able to manage his RUE with cueing. He propelled his w/c with the hemi method with min A. Demonstrated ways to increase safety and independence with bed mobility- pt able to carryover self-managing RLE in/out of bed. Pt left supine with all needs met bed alarm set.  ? ?Therapy Documentation ?Precautions:  ?Precautions ?Precautions: Fall, Other (comment) ?Precaution Comments: R hemi, global aphasia, cortrak ?Restrictions ?Weight Bearing Restrictions: No ? ?Therapy/Group: Individual Therapy ? ?Curtis Sites ?03/17/2022, 6:55 AM ?

## 2022-03-17 NOTE — Patient Care Conference (Signed)
Inpatient RehabilitationTeam Conference and Plan of Care Update Date: 03/17/2022   Time: 10:35 AM    Patient Name: Gordon Fisher      Medical Record Number: [id number]  Date of Birth: [birth year] Sex: Male         Room/Bed: 4M12C/4M12C-01 Payor Info: Payor: Theme park manager / Plan: Theme park manager OTHER / Product Type: *No Product type* /    Admit Date/Time:  02/18/2022  3:25 PM  Primary Diagnosis:  Acute ischemic left middle cerebral artery (MCA) stroke [facility])  Hospital Problems: Principal Problem:   Acute ischemic left middle cerebral artery (MCA) stroke [facility])    Expected Discharge Date: Expected Discharge Date: 03/23/22  Team Members Present: Physician leading conference: Dr. Alysia Penna Social Worker Present: Loralee Pacas, Weed Nurse Present: [name], RN PT Present: Page Spiro, PT OT Present: Willeen Cass, OT SLP Present: Sherren Kerns, SLP PPS Coordinator present : Gunnar Fusi, SLP     Current Status/Progress Goal Weekly Team Focus  Bowel/Bladder   continent to b/b with toilet trainign Q2 hours while awake, LBM 5/16         Swallow/Nutrition/ Hydration   mod I-to-intermittent sup for consumption of regular diet and thin liquids  sup A  Tolerance of current diet with implementation of safe swallowing strategies   ADL's   min A for transfers, continues with right hemiplegia UE>LE, aphasia and apraxia but improved core control and ability to follow through with ADL routine; mod A for toileting, min A for dressing UB and mod A for LB  Min A overall goals      Mobility   min assist supine<>sit using bed features, min assist sit<>stands, CGA/min assist squat pivot transfers, +2 mod assist gait with B HHAs up to 128ft requiring max/total assist to advance R LE during swing (now able to stabilize during stance without assist and initiate swing ~10-40%) - continues to have significant R hemiplegia (UE>LE), impaired midline orientation, global aphasia, and  apraxia impacting his progress with functional mobility although all improving  CGA/min assist overall at ambulatory level  pt/family education, R hemibody NMR, dynamic sitting balance, dynamic standing balance, midline orientation, transfer training, dynamic gait training, activity tolerance, D/C planning   Communication   min A basic comprehension, min A for yes/no responses, mod A for multimodal communication  min A comprehension of basic auditory information; mod-to-max A expression of needs through multimodal communication  family education, multimodal communication, reading comp at the phrase level, written communication at the word level   Safety/Cognition/ Behavioral Observations            Pain   denies pain  denies pain      Skin   skin intact  skint intact        Discharge Planning:  Patient discharging home on Friday with spouse as primary caregiver. Family education started. Custom WC ordered. TTB ordered through [employer]. Spouse ordered steady and BSC privately.   Team Discussion: Patient with stable calf DVT. Incontinent with toileting protocol in place. Patient on target to meet rehab goals: yes, dense UE present challenges for dressing. Needs CGA for squat pivots and weight shifting better. Requires min assist for upper body care and mod assist for lower body care. Able to ambulate with light mod assist. Needs min assist for comprehension of verbal responses. Managing a regular texture diet with mod  I - supervision.  *See Care Plan and progress notes for long and short-term goals.   Revisions to Treatment Plan:  Squat  pivot transfers/car transfer practice with wife Multimodal communication modes   Teaching Needs: Safety, transfers, toileting, medication management, dietary modifications, etc.  Current Barriers to Discharge: Decreased caregiver support and Home enviroment access/layout and limited access to follow up SLP services.   Possible Resolutions to  Barriers: Family education with wife OP follow up services  DME: stedy, TTB, BSC, hospital bed     Medical Summary Current Status: poor motor recovery, receptive language improving , car transfers with wife still difficult  Barriers to Discharge: Medical stability   Possible Resolutions to [employer] Focus: wife needs pt to be an easier transfer for OP therapy   Continued Need for Acute Rehabilitation Level of Care: The patient requires daily medical management by a physician with specialized training in physical medicine and rehabilitation for the following reasons: Direction of a multidisciplinary physical rehabilitation program to maximize functional independence : Yes Medical management of patient stability for increased activity during participation in an intensive rehabilitation regime.: Yes Analysis of laboratory values and/or radiology reports with any subsequent need for medication adjustment and/or medical intervention. : Yes   I attest that I was present, lead the team conference, and concur with the assessment and plan of the team.   [name] B 03/17/2022, 4:12 PM

## 2022-03-17 NOTE — Progress Notes (Signed)
Patient ID: Gordon Fisher, male   DOB: 11-May-1961, 61 y.o.   MRN: [id number] ? ?This SW covering for primary SW, Lavera Guise.  ? ?SW left message for pt wife Gordon Fisher to inform on change in d/c date to 5/23 to allow for continued practice with transfers. SW encouraged phone call today, if not, reiterated primary SW will follow-up tomorrow with more details.  ? ?Cecile Sheerer, MSW, LCSWA ?Office: [phone] ?Cell: [phone] ?Fax: [phone]  ?

## 2022-03-17 NOTE — Progress Notes (Signed)
Orthopedic Tech Progress Note ?Patient Details:  ?[name] ?Dec 09, 1960 ?[id number] ? ?Ortho Devices ?Type of Ortho Device: ASO ?Ortho Device/Splint Location: RLE ?Ortho Device/Splint Interventions: Ordered ?  ?Post Interventions ?Patient Tolerated: Well ?Instructions Provided: Care of device ? ?Donald Pore ?03/17/2022, 4:36 PM ? ?

## 2022-03-17 NOTE — Progress Notes (Signed)
?                                                       PROGRESS NOTE ? ? ?Subjective/Complaints: ? ?Remains aphasic, Y/N responses >50% accurate  ?ROS: limited due to language/communication  ? ?Objective: ?  ?No results found. ?Recent Labs  ?  03/15/22 ?0631  ?WBC 7.1  ?HGB 15.3  ?HCT 43.3  ?PLT 230  ? ? ?Recent Labs  ?  03/15/22 ?0631  ?NA 135  ?K 3.7  ?CL 105  ?CO2 23  ?GLUCOSE 107*  ?BUN 13  ?CREATININE 1.12  ?CALCIUM 8.9  ? ? ? ? ?Intake/Output Summary (Last 24 hours) at 03/17/2022 0749 ?Last data filed at 03/16/2022 6195 ?Gross per 24 hour  ?Intake 716 ml  ?Output --  ?Net 716 ml  ? ?  ? ?  ? ?Physical Exam: ?Vital Signs ?Blood pressure 113/85, pulse 92, temperature 98.6 ?F (37 ?C), temperature source Oral, resp. rate 20, height 5' 8"  (1.727 m), weight 100.8 kg, SpO2 96 %. ? ?General: No acute distress, sitting in bed ?Mood and affect are appropriate ?Heart: Regular rate and rhythm no rubs murmurs or extra sounds ?Lungs: Clear to auscultation, breathing unlabored, no rales or wheezes ?Abdomen: Positive bowel sounds, soft nontender to palpation, nondistended ?Extremities: No clubbing, cyanosis, or edema.  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  Nods and answers yes to most questions. Sometimes shakes head for no.  Does follow simple commands more than 50% of the time. Uses facial gestures to communicate also ? ?Neurologic: Cranial nerves II through XII grossly intact, motor strength is 5/5 in left and 0/5 RIght deltoid, bicep, tricep, grip, hip flexor, knee extensors, ankle dorsiflexor and plantar flexor ?Trace Right hip add  ?  ?Musculoskeletal: Full range of motion in all 4 extremities. No joint swelling or tenderness. Mildly increased tone in RUE, a little decreased today compared to yesterday ? ? ? ?Assessment/Plan: ?1. Functional deficits which require 3+ hours per day of interdisciplinary therapy in a comprehensive inpatient rehab setting. ?Physiatrist is providing close team  supervision and 24 hour management of active medical problems listed below. ?Physiatrist and rehab team continue to assess barriers to discharge/monitor patient progress toward functional and medical goals ? ?Care Tool: ? ?Bathing ?   ?Body parts bathed by patient: Chest, Abdomen, Face, Front perineal area, Right upper leg, Left upper leg, Right lower leg, Left lower leg, Right arm  ? Body parts bathed by helper: Left arm, Buttocks ?  ?  ?Bathing assist Assist Level: Minimal Assistance - Patient > 75% ?  ?  ?Upper Body Dressing/Undressing ?Upper body dressing   ?What is the patient wearing?: Pull over shirt ?   ?Upper body assist Assist Level: Minimal Assistance - Patient > 75% ?   ?Lower Body Dressing/Undressing ?Lower body dressing ? ? ?   ?What is the patient wearing?: Incontinence brief, Pants ? ?  ? ?Lower body assist Assist for lower body dressing: Moderate Assistance - Patient 50 - 74% ?   ? ?Toileting ?Toileting Toileting Activity did not occur (Probation officer and hygiene only): N/A (no void or bm)  ?Toileting assist Assist for toileting: Moderate Assistance - Patient 50 - 74% ?  ?  ?Transfers ?Chair/bed transfer ? ?Transfers assist ?   ? ?Chair/bed transfer assist level: Minimal  Assistance - Patient > 75% ?  ?  ?Locomotion ?Ambulation ? ? ?Ambulation assist ? ? Ambulation activity did not occur: Safety/medical concerns ? ?Assist level: 2 helpers (+2 mod A) ?Assistive device: Other (comment) (3 Musketeer) ?Max distance: 114f  ? ?Walk 10 feet activity ? ? ?Assist ? Walk 10 feet activity did not occur: Safety/medical concerns ? ?  ?   ? ?Walk 50 feet activity ? ? ?Assist Walk 50 feet with 2 turns activity did not occur: Safety/medical concerns ? ?  ?   ? ? ?Walk 150 feet activity ? ? ?Assist Walk 150 feet activity did not occur: Safety/medical concerns ? ?  ?  ?  ? ?Walk 10 feet on uneven surface  ?activity ? ? ?Assist Walk 10 feet on uneven surfaces activity did not occur: Safety/medical concerns ? ? ?   ?   ? ?Wheelchair ? ? ? ? ?Assist Is the patient using a wheelchair?: Yes (Per PT long-term goals) ?Type of Wheelchair: Manual ?Wheelchair activity did not occur: Safety/medical concerns ? ?  ?   ? ? ?Wheelchair 50 feet with 2 turns activity ? ? ? ?Assist ? ?  ?Wheelchair 50 feet with 2 turns activity did not occur: Safety/medical concerns ? ? ?   ? ?Wheelchair 150 feet activity  ? ? ? ?Assist ? Wheelchair 150 feet activity did not occur: Safety/medical concerns ? ? ?   ? ?Blood pressure 113/85, pulse 92, temperature 98.6 ?F (37 ?C), temperature source Oral, resp. rate 20, height 5' 8"  (1.727 m), weight 100.8 kg, SpO2 96 %. ? ?Medical Problem List and Plan: ?1. 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            -patient may not shower ?            -ELOS/Goals: 5/19 goal min/mod A Team conference today please see physician documentation under team conference tab, met with team  to discuss problems,progress, and goals. Formulized individual treatment plan based on medical history, underlying problem and comorbidities. ? ?          -Continue CIR therapies including PT, OT, and SLP , goals Min A to CGA ? Working on car transfers with wife since goal is for OP therapies  ?2.  Antithrombotics: ?-DVT/anticoagulation:  Pharmaceutical[id number] ?5/7 dopplers + for right gastroc/peroneal dvt's- -5/12 Dr. SeLeonie Manho cleared patient for DOAC, start Eliquis 5 mg bid  ?-5/17no signs of bleeding with DOAC,3 mo tx unless bleeding issues  ?            -antiplatelet therapy:  on Brillinta ?3. Pain Management: Tylenol prn.  ?4. Mood: LCSW to follow for evaluation and support when appropriate.  ?            -antipsychotic agents: N/A ? -melatonin at night seems to have been effective with sleep ?5. Neuropsych: This patient is not capable of making decisions on his own behalf. ?6. Skin/Wound Care: Routine pressure relief measures.  ?7. Fluids/Electrolytes/Nutrition: Following labs as well as  ins and outs ?8. Hyponatremia: Stable. Monitor with serial checks.  ?Repeat labs tomorrow ?9. Prediabetes: Hgb A1C-6.0. fair control . Discussed healthy diet ?-CBG (last 3)  ?No results for input(s): GLUCAP in the last 72 hours. ?Stopped CBGs due to DC TF  ?Monitor on repeat labs tomorrow ?10. Dysphagia: Heart healthy/carb mod with thin liquids ?11. Leucocytosis: resolved  ?         ? ?  Latest Ref Rng & Units 03/15/2022  ?  6:31 AM 03/08/2022  ?  5:28 AM 03/05/2022  ?  5:56 PM  ?CBC  ?WBC 4.0 - 10.5 K/uL 7.1   5.7   15.4    ?Hemoglobin 13.0 - 17.0 g/dL 15.3   14.5   15.6    ?Hematocrit 39.0 - 52.0 % 43.3   41.7   44.4    ?Platelets 150 - 400 K/uL 230   184   205    ?  ?12. Elevated LDL: continue Lipitor. ?13.  AKI: resolved  ? 5/1- Cr 1.08 and BUN doing well at 18= con't to monitor ? ?  Latest Ref Rng & Units 03/15/2022  ?  6:31 AM 03/08/2022  ?  5:28 AM 03/01/2022  ?  6:01 AM  ?BMP  ?Glucose 70 - 99 mg/dL 107   125   112    ?BUN 6 - 20 mg/dL 13   12   18     ?Creatinine 0.61 - 1.24 mg/dL 1.12   1.04   1.08    ?Sodium 135 - 145 mmol/L 135   135   137    ?Potassium 3.5 - 5.1 mmol/L 3.7   3.7   3.7    ?Chloride 98 - 111 mmol/L 105   106   105    ?CO2 22 - 32 mmol/L 23   23   24     ?Calcium 8.9 - 10.3 mg/dL 8.9   8.9   8.8    ?  ?14. Hypocalcemia:  Ionized Ca- 1.11 with normal albumin-4.1.  ?--Add calcium supplement bid.  ?15. Elevated Homocysteine levels:  B12, folate WNL--per Dr. Erlinda Hong , neuro, who felt no treatment needed as less than 50.  ?16. Obesity: BMI 35.53- Discussed healthy diet for weight loss.  ? ?  ? ?LOS: ?27 days ?A FACE TO FACE EVALUATION WAS PERFORMED ? ?Luanna Salk Xela Oregel ?03/17/2022, 7:49 AM  ? ? ? ?

## 2022-03-17 NOTE — Progress Notes (Signed)
Nutrition Follow-up ? ?DOCUMENTATION CODES:  ? ?Obesity unspecified ? ?INTERVENTION:  ? ?Continue Multivitamin w/ minerals daily ? ?NUTRITION DIAGNOSIS:  ? ?Inadequate oral intake related to dysphagia as evidenced by meal completion < 25%. - Progressing, pt now eating >75% ? ?GOAL:  ? ?Patient will meet greater than or equal to 90% of their needs - Ongoing ? ?MONITOR:  ? ?PO intake ? ?REASON FOR ASSESSMENT:  ? ?Consult ?Calorie Count, Assessment of nutrition requirement/status ? ?ASSESSMENT:  ? ?61 year old male presents with right sided weakness, right facial droop and slurred speech. CTA showed L-MCA MI occlusion and he underwent cerebral angio with thrombectomy. MRI brain done revealing small areas of acute infarct infarct in L-MCA territory with mild hemorrhagic transformation in left parietal lobe and L-MCA bifurcation residual vascular thrombus. Post op CT showed small SAH. Patient with resultant global aphasia. 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 Admitted to CIR. ? ?04/24 - Cortrak removed  ? ?Pt unavailable at time of RD visit, pt with therapy.  ? ?Pt PO intake has improved significantly.  ?Per EMR, pt intake includes: ?5/14: Dinner 100% ?5/15: Breakfast 100%, Lunch 100%, Dinner 100% ?5/16: Breakfast 100%, Lunch 100%, Dinner 100% ?5/17: Breakfast 75% ? ?Pt discharge date has changed to 5/23.  ? ?Medications reviewed and include: Calcium Carbonate, MVI, Protonix, Miralax ?Labs reviewed. ? ?Diet Order:   ?Diet Order   ? ?       ?  Diet heart healthy/carb modified Room service appropriate? Yes; Fluid consistency: Thin  Diet effective now       ?  ? ?  ?  ? ?  ? ?EDUCATION NEEDS:  ? ?No education needs have been identified at this time ? ?Skin:  Skin Assessment: Reviewed RN Assessment ? ?Last BM:  5/17 - Type 5 ? ?Height:  ? ?Ht Readings from Last 1 Encounters:  ?02/23/22 5\' 8"  (1.727 m)  ? ? ?Weight:  ? ?Wt Readings from Last 1 Encounters:  ?03/16/22 100.8 kg  ? ?BMI:  Body mass index is 33.79  kg/m?. ? ?Estimated Nutritional Needs:  ? ?Kcal:  2200-2400 ? ?Protein:  110-125 grams ? ?Fluid:  >/= 2 L/day ? ? ?Hermina Barters RD, LDN ?Clinical Dietitian ?See AMiON for contact information.  ? ?

## 2022-03-17 NOTE — Progress Notes (Addendum)
Speech Language Pathology Daily Session Note ? ?Patient Details  ?Name: Gordon Fisher ?MRN: [id number] ?Date of Birth: [birth year] ? ?Today's Date: 03/17/2022 ?SLP Individual Time: 1400-1500 ?SLP Individual Time Calculation (min): 60 min ? ?Short Term Goals: ?Week 4: SLP Short Term Goal 1 (Week 4): STG=LTG due to ELOS ? ?Skilled Therapeutic Interventions: Skilled ST treatment focused on language goals and education with patient's spouse and sister-in-law. SLP facilitated written communication with min A written cues to write sister-in-law, granddaughter, grandson, and son's names; pt independently wrote his name (first + last), his spouse, and daughter's names with 100% spelling accuracy. SLP facilitated object naming through written responses with 25% accuracy given no cues, and 75% accuracy given max A verbal and written cues. Pt responded to open ended questions through written response with mod-to-max A verbal and written cues to initiate first 1-3 letters and complete word in it's entirety. Pt continues to be stimulable to cueing hierarchy for multimodal communication and improving with ability to initiate written communication in response to open-ended questions. SLP reinforced communication strategies through multimodal means throughout session. Patient was left in chair with alarm activated and immediate needs within reach at end of session. Continue per current plan of care.   ?   ?Pain ? None/denied ? ?Therapy/Group: Individual Therapy ? ?Gordon Fisher ?03/17/2022, 4:34 PM ?

## 2022-03-18 MED ORDER — CALCIUM CARBONATE ANTACID 500 MG PO CHEW
1.0000 | CHEWABLE_TABLET | Freq: Two times a day (BID) | ORAL | 0 refills | Status: DC
  Filled 2022-03-18: qty 60, 30d supply, fill #0

## 2022-03-18 MED ORDER — PANTOPRAZOLE SODIUM 40 MG PO TBEC
40.0000 mg | DELAYED_RELEASE_TABLET | Freq: Every day | ORAL | 1 refills | Status: DC
  Filled 2022-03-18: qty 30, 30d supply, fill #0

## 2022-03-18 MED ORDER — POLYETHYLENE GLYCOL 3350 17 GM/SCOOP PO POWD
17.0000 g | Freq: Every day | ORAL | 1 refills | Status: DC
  Filled 2022-03-18: qty 476, 28d supply, fill #0

## 2022-03-18 MED ORDER — CERTAVITE/ANTIOXIDANTS PO TABS
1.0000 | ORAL_TABLET | Freq: Every day | ORAL | 0 refills | Status: DC
  Filled 2022-03-18: qty 30, 30d supply, fill #0

## 2022-03-18 MED ORDER — TICAGRELOR 90 MG PO TABS
90.0000 mg | ORAL_TABLET | Freq: Two times a day (BID) | ORAL | 1 refills | Status: DC
  Filled 2022-03-18: qty 60, 30d supply, fill #0

## 2022-03-18 MED ORDER — HYDROCORTISONE 1 % EX CREA
TOPICAL_CREAM | CUTANEOUS | 0 refills | Status: DC | PRN
  Filled 2022-03-18: qty 28, 14d supply, fill #0

## 2022-03-18 MED ORDER — CAMPHOR-MENTHOL 0.5-0.5 % EX LOTN
TOPICAL_LOTION | CUTANEOUS | 0 refills | Status: DC | PRN
  Filled 2022-03-18: qty 222, fill #0

## 2022-03-18 MED ORDER — APIXABAN 5 MG PO TABS
5.0000 mg | ORAL_TABLET | Freq: Two times a day (BID) | ORAL | 1 refills | Status: DC
  Filled 2022-03-18: qty 60, 30d supply, fill #0

## 2022-03-18 MED ORDER — ATORVASTATIN CALCIUM 40 MG PO TABS
40.0000 mg | ORAL_TABLET | Freq: Every evening | ORAL | 1 refills | Status: DC
  Filled 2022-03-18: qty 30, 30d supply, fill #0

## 2022-03-18 NOTE — Progress Notes (Signed)
Physical Therapy Discharge Summary  Patient Details  Name: Gordon Fisher MRN: [id number] Date of Birth: October 02, 1961  Today's Date: 03/18/2022 PT Individual Time: 2010-0712 PT Individual Time Calculation (min): 69 min    Patient has met 9 of 10 long term goals due to improved activity tolerance, improved balance, improved postural control, increased strength, ability to compensate for deficits, functional use of  right upper extremity and right lower extremity, improved attention, and improved awareness.  Patient to discharge at a wheelchair level requiring [place] for squat pivot transfers vs use of stedy. Performs stand pivot car transfers with heavy min assist. Patient's care partner attended multiple days of hands-on training and education and she is independent to provide the necessary physical and cognitive assistance at discharge.  Reasons goals not met: Pt continues to require +2 assist for safety with gait training.  Recommendation:  Patient will benefit from ongoing skilled PT services in home health setting to continue to advance safe functional mobility, address ongoing impairments in R hemiplegia, dynamic sitting balance, dynamic standing balance, gait training, wheelchair management and propulsion, and minimize fall risk.  Equipment: Custom manual wheelchair through [employer], recommended family obtain a stedy to perform safe transfers  Reasons for discharge: treatment goals met and discharge from hospital  Patient/family agrees with progress made and goals achieved: Yes  Skilled Therapeutic Interventions/Progress Updates:  Pt received sitting in w/c with his wife, [name], present and pt agreeable to therapy session. Therapist educated pt/wife on how to don soft ankle ASO to protect his ankle during transfers - deferred AFO consult at this time as want to ensure brace will fit pt's future mobility needs. Plan for additional car transfer training today in preparation for D/C.  Pt's wife confirms that she feels she needs HH therapies to ensure safe functional mobility at household level prior to pt advancing to outpatient therapies - SW made aware.  Transported pt to/from main entrance of hospital in w/c.  Pt's wife planning to purchase leg lifter therefore utilized one during the car transfer for safer management of pt's RLE positioning.  Pt's wife performed set-up of car transfer and all hands-on assistance for pt to complete the transfer with only intermittent min cuing from therapist progressed to no cuing. Determined that pt would benefit from R UE sling to utilize during car transfer to decrease risk of injury to his arm - notified PA to place order.  Pt performed stand pivot block practice car transfer in/out 5x with wife's assistance: Entering vehicle: increased ease of this transfer towards L, cuing pt's wife to monitor her foot placement to allow pt to step L LE laterally towards vehicle, pt's wife able to use leg lifter to bring R LE back towards vehicle when he is turning, pt able to use L UE on leg lifter to assist his wife in bringing his R LE into the vehicle Exiting vehicle: rotates hips and gets feet on the ground with wife assisting with R LE management out of vehicle and she properly positions the LE towards w/c prior to pt standing, pt's wife then uses leg lifter to manually step his R LE out and towards w/c prior to pt stepping his L LE back and then sitting down  After practice today pt's wife reports she is feeling more confident and feels safe to perform this transfer with pt upon D/C. Therapist educated on recommendation to always have +2 assist present for safety with car transfers especially in the beginning as she continues to  practice.   Transported pt back up to CIR. Pt's wife reports no additional questions for therapist at this time. Pt left seated in w/c with needs in reach and in the care of his wife.    PT  Discharge Precautions/Restrictions Precautions Precautions: Fall;Other (comment) Precaution Comments: R hemiplegia, global aphasia, apraxia Restrictions Weight Bearing Restrictions: No Pain Pain Assessment Pain Scale: Faces Faces Pain Scale: No hurt Pain Interference Pain Interference Pain Effect on Sleep: 8. Unable to answer (due to aphasia) Pain Interference with Therapy Activities: 8. Unable to answer Pain Interference with Day-to-Day Activities: 8. Unable to answer Vision/Perception  Vision - History Ability to See in Adequate Light: 1 Impaired Perception Perception: Impaired Inattention/Neglect: Other (comment) (decreased R attention although significantly improved) Praxis Praxis: Impaired Praxis Impairment Details: Motor planning (although significantly improved)  Cognition Overall Cognitive Status: Impaired/Different from baseline Arousal/Alertness: Awake/alert Orientation Level: Oriented to person (Difficult to assess 2/2 aphasia) Attention: Focused;Sustained;Selective Focused Attention: Appears intact Sustained Attention: Appears intact Memory:  (Difficult to assess 2/2 aphasia but does demo some recall of motor planning mobility tasks from prior sessions) Awareness: Impaired Safety/Judgment: Impaired Sensation Sensation Light Touch: Appears Intact (difficult to assess due to aphasia but responds to tactile stimulus on R LE) Hot/Cold: Not tested Proprioception: Impaired by gross assessment Proprioception Impaired Details: Impaired RLE Stereognosis: Not tested Coordination Gross Motor Movements are Fluid and Coordinated: No Coordination and Movement Description: continues with significant R hemiplegia and impaired trunk control/balance Motor  Motor Motor: Hemiplegia;Abnormal postural alignment and control;Abnormal tone Motor - Discharge Observations: continues with significant R hemiplegia and impaired trunk control/balance  Mobility Bed Mobility Bed  Mobility: Supine to Sit;Sit to Supine Supine to Sit: Minimal Assistance - Patient > 75% Sit to Supine: Minimal Assistance - Patient > 75% Transfers Transfers: Sit to Stand;Stand to Sit;Squat Pivot Transfers Sit to Stand: Minimal Assistance - Patient > 75% Stand to Sit: Minimal Assistance - Patient > 75% Squat Pivot Transfers: Contact Guard/Touching assist;Minimal Assistance - Patient > 75% Transfer (Assistive device): 1 person hand held assist Locomotion  Gait Ambulation: Yes Gait Assistance: 2 Helpers (+2 mod assist with skilled therapist only) Gait Distance (Feet): 100 Feet Assistive device: 2 person hand held assist Gait Assistance Details: Tactile cues for sequencing;Tactile cues for weight shifting;Tactile cues for posture;Tactile cues for weight beaing;Manual facilitation for weight shifting;Verbal cues for gait pattern;Manual facilitation for placement;Verbal cues for technique;Verbal cues for sequencing;Visual cues/gestures for sequencing;Tactile cues for placement;Tactile cues for initiation Gait Gait: Yes Gait Pattern: Impaired Gait Pattern: Step-to pattern;Step-through pattern;Poor foot clearance - right;Decreased stance time - right;Decreased step length - left;Decreased stride length;Decreased hip/knee flexion - right;Decreased dorsiflexion - right;Narrow base of support;Trunk rotated posteriorly on left;Lateral hip instability (varying step-to vs step-through, requires total assist to advance R LE during swing using leg lifter, benefits from manual faciltiatoin at R hip for stability during stance to allow increased L LE step length - using [place] Walk-on PLS AFO) Stairs / Additional Locomotion Stairs: No Wheelchair Mobility Wheelchair Mobility: Yes Wheelchair Assistance: Minimal assistance - Patient >75% Wheelchair Propulsion: Left upper extremity;Left lower extremity Wheelchair Parts Management: Needs assistance Distance: 136f  Trunk/Postural Assessment  Cervical  Assessment Cervical Assessment: Within Functional Limits Thoracic Assessment Thoracic Assessment: Within Functional Limits (rounded shoulders with protraction) Lumbar Assessment Lumbar Assessment: Exceptions to [facility](mild posterior pelvic tilt) Postural Control Postural Control: Deficits on evaluation Trunk Control: significantly improved but still with R lateral trunk LOB with moderate perturbation or far reach outside BOS in that direciton Righting Reactions:  adequate for mild LOB using L UE support Postural Limitations: decreased  Balance Balance Balance Assessed: Yes Static Sitting Balance Static Sitting - Balance Support: Feet supported;Left upper extremity supported Static Sitting - Level of Assistance: 5: Stand by assistance Dynamic Sitting Balance Dynamic Sitting - Balance Support: Feet supported;Left upper extremity supported Dynamic Sitting - Level of Assistance: Other (comment) (CGA) Static Standing Balance Static Standing - Balance Support: During functional activity;Left upper extremity supported Static Standing - Level of Assistance: 4: Min assist;Other (comment) (CGA when holding stable support with L UE) Dynamic Standing Balance Dynamic Standing - Balance Support: During functional activity;Left upper extremity supported Dynamic Standing - Level of Assistance: 4: Min assist;3: Mod assist (using L UE support on stable support such as stedy or grab bar) Extremity Assessment      RLE Assessment RLE Assessment: Exceptions to [facility] Passive Range of Motion (PROM) Comments: [facility] General Strength Comments: pt still unable to move R LE in open chain seated position but does have activation of glutes and quads during stance phase of gait as well as slight activaiton of hip flexors during swing phase RLE Strength RLE Overall Strength: Deficits Right Hip Flexion: 2-/5 Right Hip Extension: 2+/5 Right Knee Flexion: 1/5 Right Knee Extension: 3+/5 (see note above) Right Ankle  Dorsiflexion: 0/5 Right Ankle Plantar Flexion: 0/5 LLE Assessment LLE Assessment: Within Functional Limits Active Range of Motion (AROM) Comments: WFL/WNL General Strength Comments: 5/5 throughout    Tawana Scale , PT, DPT, NCS, CSRS 03/18/2022, 3:28 PM

## 2022-03-18 NOTE — Progress Notes (Signed)
Patient ID: Gordon Fisher, male   DOB: 12-05-1960, 61 y.o.   MRN: [id number]  OP referral faxed to [facility] OP location for PT/OT/SLP

## 2022-03-18 NOTE — Progress Notes (Signed)
PROGRESS NOTE   Subjective/Complaints: Pt unhappy about extension of D/C , discussed that PT wanted more training with pt/wife on car transfers  ROS: limited due to language/communication   Objective:   No results found. No results for input(s): WBC, HGB, HCT, PLT in the last 72 hours.  No results for input(s): NA, K, CL, CO2, GLUCOSE, BUN, CREATININE, CALCIUM in the last 72 hours.    Intake/Output Summary (Last 24 hours) at 03/18/2022 0745 Last data filed at 03/17/2022 1839 Gross per 24 hour  Intake 720 ml  Output --  Net 720 ml         Physical Exam: Vital Signs Blood pressure 108/76, pulse 79, temperature 98 F (36.7 C), resp. rate 18, height 5\' 8"  (1.727 m), weight 99.4 kg, SpO2 97 %.  General: No acute distress, sitting in bed Mood and affect are appropriate Heart: Regular rate and rhythm no rubs murmurs or extra sounds Lungs: Clear to auscultation, breathing unlabored, no rales or wheezes Abdomen: Positive bowel sounds, soft nontender to palpation, nondistended Extremities: No clubbing, cyanosis, or edema.  Skin: Warm and dry Neurologic: global aphasia Exp>recept--stable.  Nods and answers yes to most questions. Sometimes shakes head for no.  Does follow simple commands more than 50% of the time. Uses facial gestures to communicate also  Neurologic: Cranial nerves II through XII grossly intact, motor strength is 5/5 in left and 0/5 RIght deltoid, bicep, tricep, grip, hip flexor, knee extensors, ankle dorsiflexor and plantar flexor Trace Right hip add - unchanged 5/18   Musculoskeletal: Full range of motion in all 4 extremities. No joint swelling or tenderness. Mildly increased tone in RUE, a little decreased today compared to yesterday    Assessment/Plan: 1. Functional deficits which require 3+ hours per day of interdisciplinary therapy in a comprehensive inpatient rehab setting. Physiatrist is providing  close team supervision and 24 hour management of active medical problems listed below. Physiatrist and rehab team continue to assess barriers to discharge/monitor patient progress toward functional and medical goals  Care Tool:  Bathing    Body parts bathed by patient: Chest, Abdomen, Face, Front perineal area, Right upper leg, Left upper leg, Right lower leg, Left lower leg, Right arm   Body parts bathed by helper: Left arm, Buttocks     Bathing assist Assist Level: Minimal Assistance - Patient > 75%     Upper Body Dressing/Undressing Upper body dressing   What is the patient wearing?: Pull over shirt    Upper body assist Assist Level: Minimal Assistance - Patient > 75%    Lower Body Dressing/Undressing Lower body dressing      What is the patient wearing?: Incontinence brief, Pants     Lower body assist Assist for lower body dressing: Moderate Assistance - Patient 50 - 74%     Toileting Toileting Toileting Activity did not occur (Clothing management and hygiene only): N/A (no void or bm)  Toileting assist Assist for toileting: Moderate Assistance - Patient 50 - 74%     Transfers Chair/bed transfer  Transfers assist     Chair/bed transfer assist level: Minimal Assistance - Patient > 75%     Locomotion Ambulation   Ambulation  assist   Ambulation activity did not occur: Safety/medical concerns  Assist level: 2 helpers (+2 mod A) Assistive device: Other (comment) (3 Musketeer) Max distance: 135ft   Walk 10 feet activity   Assist  Walk 10 feet activity did not occur: Safety/medical concerns        Walk 50 feet activity   Assist Walk 50 feet with 2 turns activity did not occur: Safety/medical concerns         Walk 150 feet activity   Assist Walk 150 feet activity did not occur: Safety/medical concerns         Walk 10 feet on uneven surface  activity   Assist Walk 10 feet on uneven surfaces activity did not occur: Safety/medical  concerns         Wheelchair     Assist Is the patient using a wheelchair?: Yes (Per PT long-term goals) Type of Wheelchair: Manual Wheelchair activity did not occur: Safety/medical concerns         Wheelchair 50 feet with 2 turns activity    Assist    Wheelchair 50 feet with 2 turns activity did not occur: Safety/medical concerns       Wheelchair 150 feet activity     Assist  Wheelchair 150 feet activity did not occur: Safety/medical concerns       Blood pressure 108/76, pulse 79, temperature 98 F (36.7 C), resp. rate 18, height 5\' 8"  (1.727 m), weight 99.4 kg, SpO2 97 %.  Medical Problem List and Plan: 1. 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  Antithrombotics: -DVT/anticoagulation:  Pharmaceutical: Lovenox added as 5 days out from [facility], 30mg  q12 5/7 dopplers + for right gastroc/peroneal dvt's- -5/12 Dr. Leonie Man who cleared patient for DOAC, start Eliquis 5 mg bid  -5/17no signs of bleeding with DOAC,3 mo tx unless bleeding issues              -antiplatelet therapy:  on Brillinta 3. Pain Management: Tylenol prn.  4. Mood: LCSW to follow for evaluation and support when appropriate.              -antipsychotic agents: N/A  -melatonin at night seems to have been effective with sleep 5. Neuropsych: This patient is not capable of making decisions on his own behalf. 6. Skin/Wound Care: Routine pressure relief measures.  7. Fluids/Electrolytes/Nutrition: Following labs as well as ins and outs 8. Hyponatremia: Stable. Monitor with serial checks.  Repeat labs tomorrow 9. Prediabetes:  Hgb A1C-6.0. fair control . Discussed healthy diet -CBG (last 3)  No results for input(s): GLUCAP in the last 72 hours. Stopped CBGs due to DC TF  Monitor on repeat labs tomorrow 10. Dysphagia: Heart healthy/carb mod with thin liquids 11. Leucocytosis: resolved              Latest Ref Rng & Units 03/15/2022    6:31 AM 03/08/2022    5:28 AM 03/05/2022    5:56 PM  CBC  WBC 4.0 - 10.5 K/uL 7.1   5.7   15.4    Hemoglobin 13.0 - 17.0 g/dL 15.3   14.5   15.6    Hematocrit 39.0 - 52.0 % 43.3   41.7   44.4    Platelets 150 - 400 K/uL 230   184   205      12. Elevated LDL: continue Lipitor. 13.  AKI: resolved   5/1- Cr 1.08 and BUN doing well at 18= con't to monitor    Latest Ref Rng & Units 03/15/2022    6:31 AM 03/08/2022    5:28 AM 03/01/2022    6:01 AM  BMP  Glucose 70 - 99 mg/dL 107   125   112    BUN 6 - 20 mg/dL 13   12   18     Creatinine 0.61 - 1.24 mg/dL 1.12   1.04   1.08    Sodium 135 - 145 mmol/L 135   135   137    Potassium 3.5 - 5.1 mmol/L 3.7   3.7   3.7    Chloride 98 - 111 mmol/L 105   106   105    CO2 22 - 32 mmol/L 23   23   24     Calcium 8.9 - 10.3 mg/dL 8.9   8.9   8.8      14. Hypocalcemia:  Ionized Ca- 1.11 with normal albumin-4.1.  --Add calcium supplement bid.  15. Elevated Homocysteine levels:  B12, folate WNL--per Dr. Erlinda Hong , neuro, who felt no treatment needed as less than 50.  16. Obesity: BMI 35.53- Discussed healthy diet for weight loss.      LOS: 28 days A FACE TO [place] [facility] Houser 03/18/2022, 7:45 AM

## 2022-03-18 NOTE — Progress Notes (Addendum)
Patient ID: Gordon Fisher, male   DOB: [birth year], 61 y.o.   MRN: [id number]  Sw informed patient and family would like to continue with discharge home tomorrow. Patient will discharge with [facility] follow up PT/OT only. Physician, therapist and family aware of no available SLP in patient area. SW following up with [facility] to see if they are still able to accept the referral for PT/OT.   Patient declined by [facility] due to staffing and location.

## 2022-03-18 NOTE — Progress Notes (Signed)
Occupational Therapy Session Note  Patient Details  Name: Gordon Fisher MRN: [id number] Date of Birth: 1961/02/12  Today's Date: 03/18/2022 OT Individual Time: [id number] OT Individual Time Calculation (min): 27 min    Short Term Goals: Week 3:  OT Short Term Goal 1 (Week 3): pt will complete sit>stand with MIN A+1 wih LRAD OT Short Term Goal 2 (Week 3): pt will complete UB bathing with MINA OT Short Term Goal 3 (Week 3): pt will complete LBbathing with MOD A +1  Skilled Therapeutic Interventions/Progress Updates:    Patient received up in wheelchair and irritated regarding change in discharge date.  Adamantly shaking head no regarding staying a few extra days for increased training with family.  Patient able to indicate need to use bathroom, and transferred squat pivot to [facility] with min assist - assisit for hygiene and clothing management.   Transported patient to gym to address level surface squat pivot transfers.  Able to complete three both directions with cueing and assistance to place and manage RLE.      Therapy Documentation Precautions:  Precautions Precautions: Fall, Other (comment) Precaution Comments: R hemi, global aphasia, cortrak Restrictions Weight Bearing Restrictions: No    Pain: Pain Assessment Pain Scale: 0-10 Pain Score: 0-No pain Faces Pain Scale: No hurt     Therapy/Group: Individual Therapy  Mariah Milling 03/18/2022, 12:29 PM

## 2022-03-18 NOTE — Progress Notes (Signed)
Patient ID: Gordon Fisher, male   DOB: 11/07/1960, 61 y.o.   MRN: [id number]  Sw provided patient spouse with medical records contact information to request records.

## 2022-03-18 NOTE — Progress Notes (Signed)
Orthopedic Tech Progress Note Patient Details:  Gordon Fisher 11/14/1960 [id number]  Ortho Devices Type of Ortho Device: Arm sling Ortho Device/Splint Location: RLE Ortho Device/Splint Interventions: Ordered   Post Interventions Patient Tolerated: Well Instructions Provided: Care of device  Council Mung[place]ia A Bernadine Melecio 03/18/2022, 4:45 PM

## 2022-03-19 ENCOUNTER — Other Ambulatory Visit (HOSPITAL_COMMUNITY): Payer: Self-pay

## 2022-03-19 NOTE — Progress Notes (Signed)
Speech Language Pathology Daily Session Note  Patient Details  Name: STEAVEN WHOLEY MRN: [id number] Date of Birth: Mar 17, 1961  Today's Date: 03/18/2022 SLP Individual Time: 1300-1400  Short Term Goals: Week 4: SLP Short Term Goal 1 (Week 4): STG=LTG due to ELOS  Skilled Therapeutic Interventions: Skilled ST treatment focused on education with pt's spouse and sister-in-law. Upon arrival pt appeared visibly frustrated. SLP inquired about reasoning and it was determined through mod A multimodal cues that pt was unhappy with recommendations to extend discharge date to next week in effort to facilitate additional pt/family training prior to discharge. Pt wishes to discharge on originally set date which would be tomorrow, 5/19. Pt repetitively shook his head and verbalized "no, no, no" through duration of session and rejected any further discussion or considerations regarding discharge planning. SLP communicated this discussion with team. Considering patient will not initially be receiving ST services at discharge due to lack of Home Health ST in pt's region, SLP provided pt/family with speech/language large packet of worksheets and therapy activities for home exercise program. Educated on cueing hierarchy for improved accuracy and success. Spouse and sister-in-law verbalized and demonstrated understanding of cueing techniques. Also provided pt/family with handout containing information on various apps targeting speech/language deficits through [employer]. SLP provided demonstration on "apraxia therapy" and "naming" apps on SLP's personal iPad. All education complete. Patient was left in wheelchair with alarm activated and immediate needs within reach at end of session.    Pain  None  Therapy/Group: Individual Therapy  Tamala Ser 03/19/2022, 7:58 AM

## 2022-03-19 NOTE — Plan of Care (Signed)
  Problem: RH Comprehension Communication Goal: LTG Patient will comprehend basic/complex auditory (SLP) Description: LTG: Patient will comprehend basic/complex auditory information with cues (SLP). Outcome: Completed/Met   Problem: RH Expression Communication Goal: LTG Patient will express needs/wants via multi-modal(SLP) Description: LTG:  Patient will express needs/wants via multi-modal communication (gestures/written, etc) with cues (SLP) Outcome: Completed/Met   Problem: RH Swallowing Goal: LTG Patient will consume least restrictive diet using compensatory strategies with assistance (SLP) Description: LTG:  Patient will consume least restrictive diet using compensatory strategies with assistance (SLP) Outcome: Completed/Met

## 2022-03-19 NOTE — Progress Notes (Signed)
Patient discharged this shift with wife by side escorted by staff. Patient and wife voices understanding of discharge instructions.

## 2022-03-19 NOTE — Discharge Summary (Signed)
Physician Discharge Summary  Patient ID: Gordon Fisher MRN: [id number] DOB/AGE: 1961-10-13 61 y.o.  Admit date: 02/18/2022 Discharge date: 03/19/2022  Discharge Diagnoses:  Principal Problem:   Acute ischemic left middle cerebral artery (MCA) stroke (HCC) Active Problems:   Hypocalcemia   DVT, lower extremity, distal (HCC)   Obesity   Elevated LDL cholesterol level   Discharged Condition: good  Significant Diagnostic Studies: DG Chest 2 View  Result Date: 03/05/2022 CLINICAL DATA:  Chest pain and fever EXAM: CHEST - 2 VIEW COMPARISON:  02/13/2022 FINDINGS: Low lung volumes with bibasilar atelectasis, worse on left. Difficult to exclude basilar pneumonia. No large effusion or pneumothorax. Trachea midline. prominent heart size suspect related to low lung volumes. Monitor device overlies the left upper chest. Lateral view is limited because positioning. IMPRESSION: Low volume exam with bibasilar atelectasis.  Limited exam. Electronically Signed   By: Jerilynn Mages.  Shick M.D.   On: 03/05/2022 16:45   DG Knee Complete 4 Views Right  Result Date: 03/05/2022 CLINICAL DATA:  Pain, fever EXAM: RIGHT KNEE - COMPLETE 4+ VIEW COMPARISON:  None Available. FINDINGS: No evidence of fracture, dislocation, or joint effusion. No evidence of arthropathy or other focal bone abnormality. Mild prepatellar soft tissue swelling. Atherosclerotic vascular calcifications. IMPRESSION: 1. No acute osseous abnormality, right knee. 2. Mild prepatellar soft tissue swelling. Electronically Signed   By: Davina Poke D.O.   On: 03/05/2022 16:36   DG Foot Complete Right  Result Date: 03/05/2022 CLINICAL DATA:  Pain, fever EXAM: RIGHT FOOT COMPLETE - 3+ VIEW COMPARISON:  None Available. FINDINGS: There is no evidence of fracture or dislocation. No erosion. No periosteal elevation. There is no evidence of arthropathy or other focal bone abnormality. Atherosclerotic vascular calcifications are present. Soft tissues are unremarkable.  IMPRESSION: Negative. Electronically Signed   By: Davina Poke D.O.   On: 03/05/2022 16:36   VAS [place] LOWER EXTREMITY VENOUS (DVT)  Result Date: 03/10/2022  Lower Venous DVT Study Patient Name:  Gordon Fisher  Date of Exam:   03/10/2022 Medical Rec #: [id number]        Accession #:    [id number] Date of Birth: 03-16-1961        Patient Gender: M Patient Age:   43 years Exam Location:  [facility] Procedure:      VAS [place] LOWER EXTREMITY VENOUS (DVT) Referring Phys: Alysia Penna --------------------------------------------------------------------------------  Indications: Follow up DVT right gastrocnemius and peroneal veins.  Comparison Study: 03-05-2022 Bilateral lower extremity venous study. Performing Technologist: Darlin Coco RDMS, RVT  Examination Guidelines: A complete evaluation includes B-mode imaging, spectral Doppler, color Doppler, and power Doppler as needed of all accessible portions of each vessel. Bilateral testing is considered an integral part of a complete examination. Limited examinations for reoccurring indications may be performed as noted. The reflux portion of the exam is performed with the patient in reverse Trendelenburg.  +---------+---------------+---------+-----------+----------+--------------+ RIGHT    CompressibilityPhasicitySpontaneityPropertiesThrombus Aging +---------+---------------+---------+-----------+----------+--------------+ CFV      Full           Yes      Yes                                 +---------+---------------+---------+-----------+----------+--------------+ SFJ      Full                                                        +---------+---------------+---------+-----------+----------+--------------+  FV Prox  Full                                                        +---------+---------------+---------+-----------+----------+--------------+ FV Mid   Full                                                         +---------+---------------+---------+-----------+----------+--------------+ FV DistalFull                                                        +---------+---------------+---------+-----------+----------+--------------+ PFV      Full                                                        +---------+---------------+---------+-----------+----------+--------------+ POP      Full                                                        +---------+---------------+---------+-----------+----------+--------------+ PTV      Full                                                        +---------+---------------+---------+-----------+----------+--------------+ PERO     Partial        Yes      Yes                  Acute          +---------+---------------+---------+-----------+----------+--------------+ Gastroc  None           No       No                   Acute          +---------+---------------+---------+-----------+----------+--------------+   +----+---------------+---------+-----------+----------+--------------+ LEFTCompressibilityPhasicitySpontaneityPropertiesThrombus Aging +----+---------------+---------+-----------+----------+--------------+ CFV Full           Yes      Yes                                 +----+---------------+---------+-----------+----------+--------------+     Summary: RIGHT: - Findings consistent with acute deep vein thrombosis involving the right gastrocnemius veins, and right peroneal veins. - Findings appear essentially unchanged compared to previous examination. - No cystic structure found in the popliteal fossa.  LEFT: - No evidence of common femoral vein obstruction.  *See table(s) above for measurements and observations. Electronically signed by Orlie Pollen on 03/10/2022 at 9:17:21 PM.    Final  VAS [place] LOWER EXTREMITY VENOUS (DVT)  Result Date: 03/05/2022  Lower Venous DVT Study Patient Name:  Gordon Fisher  Date of Exam:   03/05/2022  Medical Rec #: [id number]        Accession #:    [id number] Date of Birth: [birth year]        Patient Gender: M Patient Age:   89 years Exam Location:  [facility] Procedure:      VAS [place] LOWER EXTREMITY VENOUS (DVT) Referring Phys: Tytus Strahle --------------------------------------------------------------------------------  Indications: Pain in both legs RT>LT.  Comparison Study: No prior studies. Performing Technologist: Darlin Coco RDMS, RVT  Examination Guidelines: A complete evaluation includes B-mode imaging, spectral Doppler, color Doppler, and power Doppler as needed of all accessible portions of each vessel. Bilateral testing is considered an integral part of a complete examination. Limited examinations for reoccurring indications may be performed as noted. The reflux portion of the exam is performed with the patient in reverse Trendelenburg.  +---------+---------------+---------+-----------+----------+-------------------+ RIGHT    CompressibilityPhasicitySpontaneityPropertiesThrombus Aging      +---------+---------------+---------+-----------+----------+-------------------+ CFV      Full           Yes      Yes                                      +---------+---------------+---------+-----------+----------+-------------------+ SFJ      Full                                                             +---------+---------------+---------+-----------+----------+-------------------+ FV Prox  Full                                                             +---------+---------------+---------+-----------+----------+-------------------+ FV Mid   Full                                                             +---------+---------------+---------+-----------+----------+-------------------+ FV DistalFull                                                             +---------+---------------+---------+-----------+----------+-------------------+ PFV      Full                                                              +---------+---------------+---------+-----------+----------+-------------------+ POP      Full           Yes      Yes                                      +---------+---------------+---------+-----------+----------+-------------------+  PTV      Full                                                             +---------+---------------+---------+-----------+----------+-------------------+ PERO     None           No       No                   Acute- single                                                             paired vessel       +---------+---------------+---------+-----------+----------+-------------------+ Gastroc  None           No       No                   Acute               +---------+---------------+---------+-----------+----------+-------------------+   +---------+---------------+---------+-----------+----------+--------------+ LEFT     CompressibilityPhasicitySpontaneityPropertiesThrombus Aging +---------+---------------+---------+-----------+----------+--------------+ CFV      Full           Yes      Yes                                 +---------+---------------+---------+-----------+----------+--------------+ SFJ      Full                                                        +---------+---------------+---------+-----------+----------+--------------+ FV Prox  Full                                                        +---------+---------------+---------+-----------+----------+--------------+ FV Mid   Full                                                        +---------+---------------+---------+-----------+----------+--------------+ FV DistalFull                                                        +---------+---------------+---------+-----------+----------+--------------+ PFV      Full                                                         +---------+---------------+---------+-----------+----------+--------------+  POP      Full           Yes      Yes                                 +---------+---------------+---------+-----------+----------+--------------+ PTV      Full                                                        +---------+---------------+---------+-----------+----------+--------------+ PERO     Full                                                        +---------+---------------+---------+-----------+----------+--------------+ Gastroc  Full                                                        +---------+---------------+---------+-----------+----------+--------------+     Summary: RIGHT: - Findings consistent with acute deep vein thrombosis involving the right gastrocnemius veins, and right peroneal veins.  - No cystic structure found in the popliteal fossa.  LEFT: - There is no evidence of deep vein thrombosis in the lower extremity.  - No cystic structure found in the popliteal fossa.  *See table(s) above for measurements and observations. Electronically signed by Harold Barban MD on 03/05/2022 at 8:14:23 PM.    Final     Labs:  Basic Metabolic Panel:    Latest Ref Rng & Units 03/15/2022    6:31 AM 03/08/2022    5:28 AM 03/01/2022    6:01 AM  BMP  Glucose 70 - 99 mg/dL 107   125   112    BUN 6 - 20 mg/dL 13   12   18     Creatinine 0.61 - 1.24 mg/dL 1.12   1.04   1.08    Sodium 135 - 145 mmol/L 135   135   137    Potassium 3.5 - 5.1 mmol/L 3.7   3.7   3.7    Chloride 98 - 111 mmol/L 105   106   105    CO2 22 - 32 mmol/L 23   23   24     Calcium 8.9 - 10.3 mg/dL 8.9   8.9   8.8       CBC:    Latest Ref Rng & Units 03/15/2022    6:31 AM 03/08/2022    5:28 AM 03/05/2022    5:56 PM  CBC  WBC 4.0 - 10.5 K/uL 7.1   5.7   15.4    Hemoglobin 13.0 - 17.0 g/dL 15.3   14.5   15.6    Hematocrit 39.0 - 52.0 % 43.3   41.7   44.4    Platelets 150 - 400 K/uL 230   184   205       CBG: No results for  input(s): GLUCAP in the last 168 hours.  Brief HPI:   Gordon Fisher is a 61 y.o.  male in relatively good health was admitted via [facility] on 02/11/2022 with right-sided weakness, right facial droop and slurred speech.  CT H showed left-MCA hyperdense sign and he reports sleep TNK prior to transfer to [facility].  CTA showed left-MCA M1 occlusion and he underwent cerebral angio with thrombectomy and T1C13 flow by Dr. Nelida Gores de Rodrigez.  Follow-up MRI brain done revealing small areas of acute infarct in left-MCA territory with mild hemorrhagic transformation in left parietal lobe and left-MCA bifurcation residual vascular thrombus.  He was taken back for thrombectomy with stent for dissection but had intra stent occlusion which was unable to be recanalized.  Postop CT showed small SAH which was stable on follow-up films.  He was started on ASA/Brilinta as well as tube feeds for nutritional support until MBS of 04/18 when he was advanced to D3 diet with supervision.  Repeat MRI brain 04/16 showed extensive evolving acute ischemic left-MCA occlusion increasing in size with associated edema, few additional small acute cortical/subcortical infarcts involving contralateral right frontal lobe and left ACA distribution.  Stroke felt to be due to unknown etiology and wife elected on Zio patch which was placed prior to discharge to rule out A-fib.  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  CIR was recommended due to functional decline.    Hospital Course: Gordon Fisher was admitted to rehab 02/18/2022 for inpatient therapies to consist of PT, ST and OT at least three hours five days a week. Past admission physiatrist, therapy team and rehab RN have worked together to provide customized collaborative inpatient rehab.  His blood pressures were monitored on TID basis and has been controlled without medications. He was noted  to have hypocalcemia with ionized calcium at 1.11 therefore Tums was added for calcium supplementation twice daily.  Check of labs at admission showed AKI which has resolved with serial checks.  He was noted to be febrile on 05/05 and UA done was positive therefore Rocephin was added for treatment of Enterobacter UTI.[name]  CXR was NAD. BLE Dopplers also ordered for work-up and showed acute right posterior tib/gastroc DVT.  This was monitored with repeat Dopplers 5/10 which showed persistent DVT and he was cleared to start Eliquis per discussion with Dr. Xu/neurology. ASA was discontinued and he was maintained on Eliquis and Brilinta during his stay.  Follow-up CBC shows reactive leukocytosis has resolved and H&H is stable. His blood sugars were initially monitored on ACHS basis due to hyperglycemia from tube feeds.  As intake improved, tube feeds were discontinued and he he is tolerating regular diet without difficulty.    His blood sugars were noted to be improved of tube feeds therefore CBG checks were discontinued and patient and family has been educated on heart healthy carb modified diet.  P.o. intake has been good and he is continent of bowel and bladder.  Right foot x-rays were done due to reports of pain and was negative for fracture.  He continues on Lipitor for elevated LDL.   He has made steady gains during his stay and requires min to mod assist at wheelchair level.  Patient declined extending length of stay and home health therapy is in process of being set is awaiting responses regarding acceptance from couple more agencies.   Rehab course: During patient's stay in rehab weekly team conferences were held to monitor patient's progress, set goals and discuss barriers to discharge. At admission, patient required mod to total  assist with basic ADL task and total assist with mobility.  He exhibited severe expressive and receptive aphasia with verbal output but limited to yeah and required dysphagia 3  diet with full supervision for safety. He has had improvement in activity tolerance, balance, postural control as well as ability to compensate for deficits. He is able to dress with min to mod assist and requires mod assist for most ADL tasks.  He has had significant improvement in motor planning as well as decrease in right inattention.  He requires min assist for squat pivot transfers versus use of study.  He is able to perform car transfers with heavy min assist.  He requires min to mod assist for standing.  He is able to ambulate 100 feet with +2 mod assist with skilled therapists. He requires min assist for basic comprehension and mod to max assist for communicating via Monday model communication.  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  He is tolerating regular diet without signs or symptoms of aspiration.  Family education was completed with wife and sister-in-law.    Discharge disposition: 01-Home or Self Care  Diet: Heart Healthy/carb modified  Special Instructions: No strenuous activity. Ambulation with therapy only.  Discharge Instructions     Ambulatory referral to Neurology   Complete by: As directed    Follow up with Dr. Leonie Man at [facility] in 4-6 weeks. Too complicated for RN to follow. Thanks.   Ambulatory referral to Physical Medicine Rehab   Complete by: As directed    2 week TC follow up with Jonni Sanger or [facility]--d/c date 05/19      Allergies as of 03/19/2022   Not on File      Medication List     STOP taking these medications    aspirin 81 MG chewable tablet       TAKE these medications    acetaminophen 325 MG tablet Commonly known as: TYLENOL Take 1-2 tablets (325-650 mg total) by mouth every 4 (four) hours as needed for mild pain.   atorvastatin 40 MG tablet Commonly known as: LIPITOR Take 1 tablet (40 mg total) by mouth every evening. What changed: how to take this   Brilinta 90 MG Tabs tablet Generic drug:  ticagrelor Take 1 tablet (90 mg total) by mouth 2 (two) times daily. What changed: how to take this   Calcium Antacid 500 MG chewable tablet Generic drug: calcium carbonate Chew 1 tablet (200 mg of elemental calcium total) by mouth 2 (two) times daily. Purchase over the counter--for boderline low calcium levels. What changed:  medication strength how much to take when to take this reasons to take this additional instructions   camphor-menthol lotion Commonly known as: SARNA Apply topically as needed for itching.   CertaVite/Antioxidants Tabs Take 1 tablet by mouth daily.   Eliquis 5 MG Tabs tablet Generic drug: apixaban Take 1 tablet (5 mg total) by mouth 2 (two) times daily.   hydrocortisone cream 1 % Apply topically as needed for itching.   pantoprazole 40 MG tablet Commonly known as: PROTONIX Take 1 tablet (40 mg total) by mouth daily.   polyethylene glycol powder 17 GM/SCOOP powder Commonly known as: GLYCOLAX/MIRALAX Take 17 g by mouth daily.        Follow-up Information     Garvin Fila, MD. Schedule an appointment as soon as possible for a visit in 1 month(s).   Specialties: Neurology, Radiology Why: stroke clinic Contact information: [phone]  [place] [address] [phone]         Charlett Blake, MD Follow up.   Specialty: Physical Medicine and Rehabilitation Why: office will call you with follow up appointment Contact information: [place] [address] [phone]         [facility] Follow up on 05/31/2022.   Specialty: [place] information: [place] [id number]                Signed: Bary Leriche 03/22/2022, 1:56 AM

## 2022-03-19 NOTE — Progress Notes (Addendum)
Patient ID: Gordon Fisher, male   DOB: [birth year], 61 y.o.   MRN: [id number]  Patient declined by [facility]  Patient declined by [facility]  Patient declined by [facility] Patient declined by [facility]  Patient declined by [facility] Referral sent to [facility]

## 2022-03-19 NOTE — Progress Notes (Signed)
Speech Language Pathology Discharge Summary  Patient Details  Name: Gordon Fisher MRN: [id number] Date of Birth: Aug 19, 1961   Patient has met 3 of 3 long term goals.  Patient to discharge at overall Mod;Max level.  Reasons goals not met: NA - All goals met   Clinical Impression/Discharge Summary:   Patient has made excellent gains and has met 3 of 3 long-term goals this admission due to improved expressive and receptive language skills, multimodal communication, and oropharyngeal swallow function. Pt is currently tolerating a regular consistency diet with thin liquids and is mod I for implementation of safe swallow precautions and strategies. Pt is currently completing language tasks with min A for basic comprehension, and mod-to-max A for communicating needs through multimodal communication. 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 Pt excels with basic auditory comprehension, written tasks at the word level, and reading comprehension at the word and phrase level which has been emphasized extensively during treatment to increase pt's use of multimodal communication. Patient and family education is complete and patient to discharge at overall mod-to-max A level from a communication standpoint; mod I for swallowing. Patient's care partner is independent to provide the necessary physical and cognitive assistance at discharge. Pt has been provided with several low-tech communication supports, packet of speech/language activities for home exercise program, and numerous handouts containing information on stroke, dysphagia, aphasia, apraxia, strategies to support expressive/receptive language, and various therapy apps. Patient would highly benefit from continued SLP services in home health or outpatient settings depending on availability in pt's region to maximize language function and functional communication.   Care Partner:  Caregiver Able to Provide Assistance:  Yes  Type of Caregiver Assistance: Cognitive;Physical  Recommendation:  24 hour supervision/assistance;Outpatient SLP;Home Health SLP  Rationale for SLP Follow Up: Maximize functional communication;Maximize cognitive function and independence;Reduce caregiver burden   Equipment: Pt provided with several low-tech communication supports   Reasons for discharge: Discharged from hospital;Treatment goals met   Patient/Family Agrees with Progress Made and Goals Achieved: Yes    Patty Sermons 03/19/2022, 5:41 PM

## 2022-03-19 NOTE — Progress Notes (Signed)
Inpatient Rehabilitation Discharge Medication Review by a Pharmacist  A complete drug regimen review was completed for this patient to identify any potential clinically significant medication issues.  High Risk Drug Classes Is patient taking? Indication by Medication  Antipsychotic No   Anticoagulant Yes Apixaban- New DVT  Antibiotic No   Opioid No   Antiplatelet Yes Brilinta- CVA prophylaxis  Hypoglycemics/insulin No   Vasoactive Medication No   Chemotherapy No   Other Yes Lipitor- HLD Protonix- GERD     Type of Medication Issue Identified Description of Issue Recommendation(s)  Drug Interaction(s) (clinically significant)     Duplicate Therapy     Allergy     No Medication Administration End Date     Incorrect Dose     Additional Drug Therapy Needed     Significant med changes from prior encounter (inform family/care partners about these prior to discharge).    Other       Clinically significant medication issues were identified that warrant physician communication and completion of prescribed/recommended actions by midnight of the next day:  No   Time spent performing this drug regimen review (minutes):  30   Kaytlen Lightsey BS, PharmD, BCPS Clinical Pharmacist 03/19/2022 8:12 AM  Contact: [phone] after 3 PM  "Be curious, not judgmental..." -Jamal Maes

## 2022-03-19 NOTE — Progress Notes (Signed)
PROGRESS NOTE   Subjective/Complaints: Smiling, aware of d/c ROS: limited due to language/communication   Objective:   No results found. No results for input(s): WBC, HGB, HCT, PLT in the last 72 hours.  No results for input(s): NA, K, CL, CO2, GLUCOSE, BUN, CREATININE, CALCIUM in the last 72 hours.    Intake/Output Summary (Last 24 hours) at 03/19/2022 0743 Last data filed at 03/18/2022 1836 Gross per 24 hour  Intake 472 ml  Output --  Net 472 ml         Physical Exam: Vital Signs Blood pressure 122/72, pulse 70, temperature 97.8 F (36.6 C), resp. rate 18, height 5\' 8"  (1.727 m), weight 99.4 kg, SpO2 96 %.  General: No acute distress, sitting in bed Mood and affect are appropriate Heart: Regular rate and rhythm no rubs murmurs or extra sounds Lungs: Clear to auscultation, breathing unlabored, no rales or wheezes Abdomen: Positive bowel sounds, soft nontender to palpation, nondistended Extremities: No clubbing, cyanosis, or edema.  Skin: Warm and dry Neurologic: global aphasia Exp>recept--stable.  Nods and answers yes to most questions. Sometimes shakes head for no.  Does follow simple commands more than 50% of the time. Uses facial gestures to communicate also  Neurologic: Cranial nerves II through XII grossly intact, motor strength is 5/5 in left and 0/5 RIght deltoid, bicep, tricep, grip, hip flexor, knee extensors, ankle dorsiflexor and plantar flexor Trace Right hip add - unchanged 5/19   Musculoskeletal: Full range of motion in all 4 extremities. No joint swelling or tenderness. Mildly increased tone in RUE, a little decreased today compared to yesterday    Assessment/Plan: 1. Functional deficits due to L MCA infarct  Stable for D/C today F/u PCP in 3-4 weeks F/u PM&R 2 weeks See D/C summary See D/C instructions   Care Tool:  Bathing    Body parts bathed by patient: Chest, Abdomen, Face, Front  perineal area, Right upper leg, Left upper leg, Right lower leg, Left lower leg, Right arm   Body parts bathed by helper: Left arm, Buttocks     Bathing assist Assist Level: Minimal Assistance - Patient > 75%     Upper Body Dressing/Undressing Upper body dressing   What is the patient wearing?: Pull over shirt    Upper body assist Assist Level: Minimal Assistance - Patient > 75%    Lower Body Dressing/Undressing Lower body dressing      What is the patient wearing?: Incontinence brief, Pants     Lower body assist Assist for lower body dressing: Moderate Assistance - Patient 50 - 74%     Toileting Toileting Toileting Activity did not occur (Clothing management and hygiene only): N/A (no void or bm)  Toileting assist Assist for toileting: Moderate Assistance - Patient 50 - 74%     Transfers Chair/bed transfer  Transfers assist     Chair/bed transfer assist level: Minimal Assistance - Patient > 75%     Locomotion Ambulation   Ambulation assist   Ambulation activity did not occur: Safety/medical concerns  Assist level: 2 helpers (+2 mod A) Assistive device: Other (comment) (3 Musketeer) Max distance: 173ft   Walk 10 feet activity   Assist  Walk 10 feet activity did not occur: Safety/medical concerns        Walk 50 feet activity   Assist Walk 50 feet with 2 turns activity did not occur: Safety/medical concerns         Walk 150 feet activity   Assist Walk 150 feet activity did not occur: Safety/medical concerns         Walk 10 feet on uneven surface  activity   Assist Walk 10 feet on uneven surfaces activity did not occur: Safety/medical concerns         Wheelchair     Assist Is the patient using a wheelchair?: Yes (Per PT long-term goals) Type of Wheelchair: Manual Wheelchair activity did not occur: Safety/medical concerns         Wheelchair 50 feet with 2 turns activity    Assist    Wheelchair 50 feet with 2 turns  activity did not occur: Safety/medical concerns       Wheelchair 150 feet activity     Assist  Wheelchair 150 feet activity did not occur: Safety/medical concerns       Blood pressure 122/72, pulse 70, temperature 97.8 F (36.6 C), resp. rate 18, height 5\' 8"  (1.727 m), weight 99.4 kg, SpO2 96 %.  Medical Problem List and Plan: 1. Functional deficits secondary to left MCA CVA             -patient may not shower             -ELOS/Goals: 5/19 , Now min A for car transfers but wife would rather have [facility] therapy to start ( aware that no SLP available )   2.  Antithrombotics: -DVT/anticoagulation:  Pharmaceutical: Lovenox added as 5 days out from [facility], 30mg  q12 5/7 dopplers + for right gastroc/peroneal dvt's- -5/12 Dr. Leonie Man who cleared patient for DOAC, start Eliquis 5 mg bid  -5/17no signs of bleeding with DOAC,3 mo tx unless bleeding issues              -antiplatelet therapy:  on Brillinta 3. Pain Management: Tylenol prn.  4. Mood: LCSW to follow for evaluation and support when appropriate.              -antipsychotic agents: N/A  -melatonin at night seems to have been effective with sleep 5. Neuropsych: This patient is not capable of making decisions on his own behalf. 6. Skin/Wound Care: Routine pressure relief measures.  7. Fluids/Electrolytes/Nutrition: Following labs as well as ins and outs 8. Hyponatremia: Stable. Monitor with serial checks.  Repeat labs tomorrow 9. Prediabetes: Hgb A1C-6.0. fair control . Discussed healthy diet -CBG (last 3)  No results for input(s): GLUCAP in the last 72 hours. Stopped CBGs due to DC TF  Monitor on repeat labs tomorrow 10. Dysphagia: Heart healthy/carb mod with thin liquids 11. Leucocytosis: resolved              Latest Ref Rng & Units 03/15/2022    6:31 AM 03/08/2022    5:28 AM 03/05/2022    5:56 PM  CBC  WBC 4.0 - 10.5 K/uL 7.1   5.7   15.4    Hemoglobin 13.0 - 17.0 g/dL 15.3   14.5   15.6    Hematocrit 39.0 - 52.0 % 43.3   41.7    44.4    Platelets 150 - 400 K/uL 230   184   205      12. Elevated LDL: continue Lipitor. 13.  AKI: resolved   5/1- Cr  1.08 and BUN doing well at 18= con't to monitor    Latest Ref Rng & Units 03/15/2022    6:31 AM 03/08/2022    5:28 AM 03/01/2022    6:01 AM  BMP  Glucose 70 - 99 mg/dL 107   125   112    BUN 6 - 20 mg/dL 13   12   18     Creatinine 0.61 - 1.24 mg/dL 1.12   1.04   1.08    Sodium 135 - 145 mmol/L 135   135   137    Potassium 3.5 - 5.1 mmol/L 3.7   3.7   3.7    Chloride 98 - 111 mmol/L 105   106   105    CO2 22 - 32 mmol/L 23   23   24     Calcium 8.9 - 10.3 mg/dL 8.9   8.9   8.8      14. Hypocalcemia:  Ionized Ca- 1.11 with normal albumin-4.1.  --Add calcium supplement bid.  15. Elevated Homocysteine levels:  B12, folate WNL--per Dr. Erlinda Hong , neuro, who felt no treatment needed as less than 50.  16. Obesity: BMI 35.53- Discussed healthy diet for weight loss.      LOS: 29 days A FACE TO [facility] 03/19/2022, 7:43 AM

## 2022-03-19 NOTE — Progress Notes (Signed)
Occupational Therapy Discharge Summary  Patient Details  Name: Gordon Fisher MRN: [id number] Date of Birth: [birth year]     Patient has met 12 of 12 long term goals due to improved activity tolerance, improved balance, postural control, ability to compensate for deficits, and improved coordination.  Pt can don clothing with min to mod A. Pt's family purchased a STEDY to assist with toilet transfers/ access to the bathroom. Pt can perform squat pivot transfers with contact guard to min A. Pt's family has gone through extensive therapy sessions with hands on training . Patient to discharge at overall Mod Assist level.  Patient's care partner is independent to provide the necessary physical and cognitive assistance at discharge.    Reasons goals not met: n/a  Recommendation:  Patient will benefit from ongoing skilled OT services in outpatient setting to continue to advance functional skills in the area of BADL and Reduce care partner burden.  Equipment: No equipment provided  Reasons for discharge: treatment goals met and discharge from hospital  Patient/family agrees with progress made and goals achieved: Yes  OT Discharge Precautions/Restrictions  Precautions Precautions: Fall;Other (comment) Precaution Comments: R hemiplegia, global aphasia, apraxia Restrictions Weight Bearing Restrictions: No  Pain Pain Assessment Pain Scale: 0-10 Pain Score: 0-No pain ADL See CARE tool Vision Baseline Vision/History: 0 No visual deficits Alignment/Gaze Preference: Head tilt;Gaze left Tracking/Visual Pursuits: Able to track stimulus in all quads without difficulty Perception   WFL Praxis  WFL Cognition Cognition Overall Cognitive Status: Impaired/Different from baseline Arousal/Alertness: Awake/alert Orientation Level: Nonverbal/unable to assess Attention: Focused;Sustained;Selective Focused Attention: Appears intact Sustained Attention: Appears intact Selective Attention:  Impaired Selective Attention Impairment: Functional complex Awareness: Impaired Awareness Impairment: Emergent impairment Behaviors: Impulsive Safety/Judgment: Impaired Brief Interview for Mental Status (BIMS) Repetition of Three Words (First Attempt): None Temporal Orientation: Year: Nonsensical Temporal Orientation: Month: Nonsensical Temporal Orientation: Day: No answer Recall: "Sock": No answer Recall: "Blue": No answer Recall: "Bed": No answer BIMS Summary Score: 99 Sensation Sensation Proprioception: Impaired by gross assessment;Impaired Detail Proprioception Impaired Details: Impaired RLE;Impaired RUE Additional Comments: difficulty fully assessing due to global aphasia, but pt able to verbalize to LT on R digits Coordination Gross Motor Movements are Fluid and Coordinated: No Fine Motor Movements are Fluid and Coordinated: No Coordination and Movement Description: continues with significant R hemiplegia and impaired trunk control/balance Finger Nose Finger Test: unable to complete on R Motor  Motor Motor: Hemiplegia;Abnormal postural alignment and control;Abnormal tone Motor - Discharge Observations: continues with significant R hemiplegia and impaired trunk control/balance Mobility  Bed Mobility Supine to Sit: Minimal Assistance - Patient > 75% Sit to Supine: Minimal Assistance - Patient > 75% Transfers Sit to Stand: Minimal Assistance - Patient > 75% Stand to Sit: Minimal Assistance - Patient > 75%  Trunk/Postural Assessment  Cervical Assessment Cervical Assessment: Within Functional Limits Thoracic Assessment Thoracic Assessment: Within Functional Limits (rounded shoulders) Lumbar Assessment Lumbar Assessment: Exceptions to [facility] (posterior pelvic tilt) Postural Control Trunk Control: significantly improved but still with R lateral trunk LOB with moderate perturbation or far reach outside BOS in that direciton Righting Reactions: adequate for mild LOB using L UE  support Postural Limitations: decreased  Balance Balance Balance Assessed: Yes Static Sitting Balance Static Sitting - Level of Assistance: 5: Stand by assistance Dynamic Sitting Balance Dynamic Sitting - Balance Support: Feet supported;Left upper extremity supported Dynamic Sitting - Level of Assistance: 5: Stand by assistance Static Standing Balance Static Standing - Balance Support: During functional activity;Left upper extremity supported Static Standing -  Level of Assistance: 4: Min assist;Other (comment) Dynamic Standing Balance Dynamic Standing - Balance Support: During functional activity;Left upper extremity supported Dynamic Standing - Level of Assistance: 4: Min assist;3: Mod assist Extremity/Trunk Assessment RUE Assessment RUE Assessment: Exceptions to [place] Body System: Neuro Brunstrum levels for arm and hand: Arm;Hand Brunstrum level for arm: Stage I Presynergy Brunstrum level for hand: Stage I Flaccidity LUE Assessment LUE Assessment: Within Functional Limits   Willeen [facility] 03/19/2022, 9:15 AM

## 2022-03-19 NOTE — Progress Notes (Signed)
Inpatient Rehabilitation Care Coordinator Discharge Note   Patient Details  Name: DAVIDMICHAEL ZARAZUA MRN: [id number] Date of Birth: 07/14/1961   Discharge location: Home  Length of Stay:    Discharge activity level: MOD  Home/community participation: spouse  Patient response WU:[id number] Literacy - How often do you need to have someone help you when you read instructions, pamphlets, or other written material from your doctor or pharmacy?: Patient unable to respond  Patient response YN:WGNFAO Isolation - How often do you feel lonely or isolated from those around you?: Patient declines to respond  Services provided included: SW, Neuropsych, Pharmacy, TR, CM, RN, SLP, OT, PT, RD, MD  Financial Services:  Financial Services Utilized: Private Insurance [facility] MEDICARE  Choices offered to/list presented to:    Follow-up services arranged:  Home Health Home Health Agency: TBD upon insurance approval         Patient response to transportation need: Is the patient able to respond to transportation needs?: Yes In the past 12 months, has lack of transportation kept you from medical appointments or from getting medications?: No In the past 12 months, has lack of transportation kept you from meetings, work, or from getting things needed for daily living?: No    Comments (or additional information):  Patient/Family verbalized understanding of follow-up arrangements:  Yes  Individual responsible for coordination of the follow-up plan: spouse  Confirmed correct DME delivered: Andria Rhein 03/19/2022    Andria Rhein

## 2022-03-22 DIAGNOSIS — E669 Obesity, unspecified: Secondary | ICD-10-CM

## 2022-03-22 DIAGNOSIS — I824Z9 Acute embolism and thrombosis of unspecified deep veins of unspecified distal lower extremity: Secondary | ICD-10-CM

## 2022-03-22 DIAGNOSIS — E7800 Pure hypercholesterolemia, unspecified: Secondary | ICD-10-CM

## 2022-03-22 NOTE — Progress Notes (Signed)
Patient accepted by [facility] for follow up PT/OT

## 2022-03-23 ENCOUNTER — Telehealth: Payer: Self-pay

## 2022-03-23 NOTE — Telephone Encounter (Signed)
Transition Care Management Unsuccessful Follow-up Telephone Call  Date of discharge and from where:  03/19/2022 [facility]  Attempts:  1st Attempt  Reason for unsuccessful TCM follow-up call:  Left voice message with appt and address with wife.

## 2022-03-24 NOTE — Telephone Encounter (Signed)
Transitional Care call--wife    Are you/is patient experiencing any problems since coming home? Right side swollen, elevated and raised Are there any questions regarding any aspect of care? No Are there any questions regarding medications administration/dosing? No Are meds being taken as prescribed? Yes Patient should review meds with caller to confirm Have there been any falls? No Has Home Health been to the house and/or have they contacted you? Yes If not, have you tried to contact them? Can we help you contact them? Are bowels and bladder emptying properly? Zoe Lan there any unexpected incontinence issues? No If applicable, is patient following bowel/bladder programs? Any fevers, problems with breathing, unexpected pain? No Are there any skin problems or new areas of breakdown? No Has the patient/family member arranged specialty MD follow up (ie cardiology/neurology/renal/surgical/etc)? Yes Can we help arrange? Does the patient need any other services or support that we can help arrange? No Are caregivers following through as expected in assisting the patient? Yes Has the patient quit smoking, drinking alcohol, or using drugs as recommended?  Yes  Appointment time 1:20 pm, arrive time 1:00 pm Dr. Benjie Karvonen on 04/01/2022 [address]

## 2022-03-31 ENCOUNTER — Telehealth: Payer: Self-pay

## 2022-03-31 NOTE — Telephone Encounter (Signed)
Gordon Fisher called to get OT orders for patient. 1 week 2 and 2 week 2. Verbal orders given

## 2022-04-01 ENCOUNTER — Encounter: Payer: 59 | Attending: Physical Medicine & Rehabilitation | Admitting: Physical Medicine & Rehabilitation

## 2022-04-01 VITALS — BP 121/82 | HR 86

## 2022-04-01 DIAGNOSIS — R1310 Dysphagia, unspecified: Secondary | ICD-10-CM | POA: Diagnosis not present

## 2022-04-01 DIAGNOSIS — M79621 Pain in right upper arm: Secondary | ICD-10-CM | POA: Diagnosis not present

## 2022-04-01 DIAGNOSIS — E7800 Pure hypercholesterolemia, unspecified: Secondary | ICD-10-CM | POA: Diagnosis present

## 2022-04-01 DIAGNOSIS — I824Y1 Acute embolism and thrombosis of unspecified deep veins of right proximal lower extremity: Secondary | ICD-10-CM

## 2022-04-01 DIAGNOSIS — I639 Cerebral infarction, unspecified: Secondary | ICD-10-CM

## 2022-04-01 MED ORDER — ATORVASTATIN CALCIUM 40 MG PO TABS: 40.0000 mg | ORAL_TABLET | Freq: Every evening | ORAL | 1 refills | Status: AC

## 2022-04-01 MED ORDER — TICAGRELOR 90 MG PO TABS: 90.0000 mg | ORAL_TABLET | Freq: Two times a day (BID) | ORAL | 1 refills | Status: DC

## 2022-04-01 MED ORDER — POLYETHYLENE GLYCOL 3350 17 GM/SCOOP PO POWD: 17.0000 g | Freq: Every day | ORAL | 1 refills | Status: DC

## 2022-04-01 MED ORDER — APIXABAN 5 MG PO TABS: 5.0000 mg | ORAL_TABLET | Freq: Two times a day (BID) | ORAL | 1 refills | Status: DC

## 2022-04-01 MED ORDER — PANTOPRAZOLE SODIUM 40 MG PO TBEC: 40.0000 mg | DELAYED_RELEASE_TABLET | Freq: Every day | ORAL | 1 refills | Status: DC

## 2022-04-01 MED ORDER — ACETAMINOPHEN 325 MG PO TABS: 325.0000 mg | ORAL_TABLET | ORAL | Status: DC | PRN

## 2022-04-01 MED ORDER — DICLOFENAC SODIUM 1 % EX GEL: 2.0000 g | Freq: Four times a day (QID) | CUTANEOUS | Status: AC

## 2022-04-01 NOTE — Progress Notes (Unsigned)
Subjective:    Patient ID: Gordon Fisher, male    DOB: 1961-05-24, 61 y.o.   MRN: [id number]  Brief HPI:   Gordon Fisher is a 61 y.o. male in relatively good health was admitted via [facility] on 02/11/2022 with right-sided weakness, right facial droop and slurred speech.  CT H showed left-MCA hyperdense sign and he reports sleep TNK prior to transfer to [facility].  CTA showed left-MCA M1 occlusion and he underwent cerebral angio with thrombectomy and T1C13 flow by Dr. Nelida Gores de Rodrigez.  Follow-up MRI brain done revealing small areas of acute infarct in left-MCA territory with mild hemorrhagic transformation in left parietal lobe and left-MCA bifurcation residual vascular thrombus.  He was taken back for thrombectomy with stent for dissection but had intra stent occlusion which was unable to be recanalized.  Postop CT showed small SAH which was stable on follow-up films.   He was started on ASA/Brilinta as well as tube feeds for nutritional support until MBS of 04/18 when he was advanced to D3 diet with supervision.  Repeat MRI brain 04/16 showed extensive evolving acute ischemic left-MCA occlusion increasing in size with associated edema, few additional small acute cortical/subcortical infarcts involving contralateral right frontal lobe and left ACA distribution.  Stroke felt to be due to unknown etiology and wife elected on Zio patch which was placed prior to discharge to rule out A-fib.  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  CIR was recommended due to functional decline.      Hospital Course: Gordon Fisher was admitted to rehab 02/18/2022 for inpatient therapies to consist of PT, ST and OT at least three hours five days a week. Past admission physiatrist, therapy team and rehab RN have worked together to provide customized collaborative inpatient rehab.  His blood pressures were monitored on TID  basis and has been controlled without medications. He was noted to have hypocalcemia with ionized calcium at 1.11 therefore Tums was added for calcium supplementation twice daily.  Check of labs at admission showed AKI which has resolved with serial checks.   He was noted to be febrile on 05/05 and UA done was positive therefore Rocephin was added for treatment of Enterobacter UTI.[name]  CXR was NAD. BLE Dopplers also ordered for work-up and showed acute right posterior tib/gastroc DVT.  This was monitored with repeat Dopplers 5/10 which showed persistent DVT and he was cleared to start Eliquis per discussion with Dr. Xu/neurology. ASA was discontinued and he was maintained on Eliquis and Brilinta during his stay.  Follow-up CBC shows reactive leukocytosis has resolved and H&H is stable. His blood sugars were initially monitored on ACHS basis due to hyperglycemia from tube feeds.  As intake improved, tube feeds were discontinued and he he is tolerating regular diet without difficulty.     His blood sugars were noted to be improved of tube feeds therefore CBG checks were discontinued and patient and family has been educated on heart healthy carb modified diet.  P.o. intake has been good and he is continent of bowel and bladder.  Right foot x-rays were done due to reports of pain and was negative for fracture.  He continues on Lipitor for elevated LDL.   He has made steady gains during his stay and requires min to mod assist at wheelchair level.  Patient declined extending length of stay and home health therapy is in process of being set is awaiting  responses regarding acceptance from couple more agencies.     Rehab course: During patient's stay in rehab weekly team conferences were held to monitor patient's progress, set goals and discuss barriers to discharge. At admission, patient required mod to total assist with basic ADL task and total assist with mobility.  He exhibited severe expressive and receptive  aphasia with verbal output but limited to yeah and required dysphagia 3 diet with full supervision for safety. He has had improvement in activity tolerance, balance, postural control as well as ability to compensate for deficits. He is able to dress with min to mod assist and requires mod assist for most ADL tasks.  He has had significant improvement in motor planning as well as decrease in right inattention.   He requires min assist for squat pivot transfers versus use of study.  He is able to perform car transfers with heavy min assist.  He requires min to mod assist for standing.  He is able to ambulate 100 feet with +2 mod assist with skilled therapists. He requires min assist for basic comprehension and mod to max assist for communicating via Monday model communication.  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  He is tolerating regular diet without signs or symptoms of aspiration.  Family education was completed with wife and sister-in-law.  HPI Interval History Patient is a 61 year old male who recently completed CIR after he had a left MCA CVA.  He is here with his wife.  They reports he is doing well overall and is working with PT OT and speech therapy.  He continues on Eliquis for DVT with plans to continue for 3 months.  He is scheduled to get a PCP at the [place] but this will not occur until the end of July.  He needs a refill of his medications.  He has scheduled follow-up with neurology.  He continues to have severe expressive aphasia but his comprehension has improved significantly.  He has occasional pain in his right arm particularly when the arm is not supported.  Tylenol helps with this pain.  He also has a little bit of difficulty with his wheelchair due to rough terrain around his house. Denies depression.    Pain Inventory Average Pain 7 Pain Right Now 0 My pain is intermittent and aching  LOCATION OF PAIN  shoulder, elbow, wrist,  hand, fingers, thigh, knee, leg, ankle  BOWEL Number of stools per week: 7 Oral laxative use Yes  Type of laxative miralax Enema or suppository use No  History of colostomy No  Incontinent No   BLADDER Normal In and out cath, frequency . Able to self cath  . Bladder incontinence Yes  Frequent urination No  Leakage with coughing No  Difficulty starting stream No  Incomplete bladder emptying No    Mobility how many minutes can you walk? 0 ability to climb steps?  no do you drive?  no use a wheelchair needs help with transfers  Function retired I need assistance with the following:  feeding, dressing, bathing, toileting, meal prep, household duties, and shopping  Neuro/Psych bladder control problems weakness numbness tingling trouble walking confusion  Prior Studies TC appt  Physicians involved in your care TC appt   No family history on file. Social History   Socioeconomic History   Marital status: Married    Spouse name: Not on file   Number of children: Not on file   Years of education: Not on  file   Highest education level: Not on file  Occupational History   Not on file  Tobacco Use   Smoking status: Never    Passive exposure: Never   Smokeless tobacco: Never  Vaping Use   Vaping Use: Never used  Substance and Sexual Activity   Alcohol use: Never   Drug use: Never   Sexual activity: Never  Other Topics Concern   Not on file  Social History Narrative   Not on file   Social Determinants of Health   Financial Resource Strain: Not on file  Food Insecurity: Not on file  Transportation Needs: Not on file  Physical Activity: Not on file  Stress: Not on file  Social Connections: Not on file   Past Surgical History:  Procedure Laterality Date   BUBBLE STUDY  02/15/2022   Procedure: BUBBLE STUDY;  Surgeon: Werner Lean, MD;  Location: [place];  Service: Cardiovascular;;   IR CT HEAD LTD  02/11/2022   IR CT HEAD LTD   02/11/2022   IR CT HEAD LTD  02/11/2022   IR PERCUTANEOUS ART THROMBECTOMY/INFUSION INTRACRANIAL INC DIAG ANGIO  02/11/2022   IR PERCUTANEOUS ART THROMBECTOMY/INFUSION INTRACRANIAL INC DIAG ANGIO  02/11/2022   IR US GUIDE VASC ACCESS RIGHT  02/11/2022   IR US GUIDE VASC ACCESS RIGHT  02/11/2022   RADIOLOGY WITH ANESTHESIA N/A 02/11/2022   Procedure: IR WITH ANESTHESIA;  Surgeon: Radiologist, Medication, MD;  Location: [place];  Service: Radiology;  Laterality: N/A;   RADIOLOGY WITH ANESTHESIA N/A 02/11/2022   Procedure: RADIOLOGY WITH ANESTHESIA;  Surgeon: Radiologist, Medication, MD;  Location: [place];  Service: Radiology;  Laterality: N/A;   TEE WITHOUT CARDIOVERSION N/A 02/15/2022   Procedure: TRANSESOPHAGEAL ECHOCARDIOGRAM (TEE);  Surgeon: Werner Lean, MD;  Location: [facility] ENDOSCOPY;  Service: Cardiovascular;  Laterality: N/A;   No past medical history on file. BP 121/82   Pulse 86   SpO2 95%   Opioid Risk Score:   Fall Risk Score:  `1  Depression screen [facility] 2/9     04/01/2022    1:38 PM  Depression screen PHQ 2/9  Decreased Interest 0  Down, Depressed, Hopeless 0  PHQ - 2 Score 0  Altered sleeping 0  Tired, decreased energy 0  Change in appetite 0  Feeling bad or failure about yourself  0  Trouble concentrating 0  Moving slowly or fidgety/restless 0  Suicidal thoughts 0  PHQ-9 Score 0      Review of Systems  Musculoskeletal:  Positive for gait problem.  Neurological:  Positive for weakness and numbness.  Psychiatric/Behavioral:  Positive for confusion.   All other systems reviewed and are negative.     Objective:   Physical Exam  Gen: no distress, normal appearing HEENT: oral mucosa pink and moist, NCAT Cardio: Reg rate Chest: normal effort, normal rate of breathing Abd: soft, non-distended Ext: no edema Psych: pleasant, normal affect Skin: intact Neuro: Aphasia primarily expressive at this point.  He is able to nod yes or shake his head no.  He follows  commands the majority of the time.  He also uses gestures to communicate. Musculoskeletal: Strength 5 out of 5 left lower and left upper extremity Strength 0 out of 5 right lower extremity and right upper extremity Shoulder subluxation noted on the right Mildly increased tone in the right upper extremity No joint swelling or tenderness noted      Assessment & Plan:    Medical Problem List and Plan: Left MCA CVA  -  Continue Brillinta-refilled, PT/OT/SLP  2. Left shoulder pain -Suspect related to shoulder subluxation, appears to be mild and improves with Tylenol -Possibly neuropathic pain component to discuss trying low-dose gabapentin patient declined -Continue occasional use of sling to prevent injury -Continue arm trough -Patient declined shoulder injection -Order Voltaren gel  3.  Dysphagia, continue work with speech therapy  4. DVT right gastroc/peroneal -Continue Eliquis 5 twice daily, plans for 3 months of treatment,  was started on 03/12/2022 -Medication was refilled  5. Elevated LDL -Refilled Lipitor

## 2022-04-03 ENCOUNTER — Encounter: Payer: Self-pay | Admitting: Physical Medicine & Rehabilitation

## 2022-04-06 NOTE — Telephone Encounter (Signed)
ERROR

## 2022-04-07 ENCOUNTER — Other Ambulatory Visit (HOSPITAL_BASED_OUTPATIENT_CLINIC_OR_DEPARTMENT_OTHER): Payer: Self-pay

## 2022-04-07 ENCOUNTER — Other Ambulatory Visit (HOSPITAL_COMMUNITY): Payer: Self-pay

## 2022-04-07 ENCOUNTER — Telehealth (HOSPITAL_BASED_OUTPATIENT_CLINIC_OR_DEPARTMENT_OTHER): Payer: Self-pay

## 2022-04-07 NOTE — Telephone Encounter (Signed)
Pharmacy Transitions of Care Follow-up Telephone Call  Date of discharge: 03/19/22  Discharge Diagnosis: Stroke  How have you been since you were released from the hospital? Patient was unavailable, but wife stated that they just saw the doctor yesterday and everything is going well. He has stopped protonix, but otherwise no questions or concerns about his medications.   Medication changes made at discharge: START taking these medications  START taking these medications  camphor-menthol lotion Commonly known as: SARNA Apply topically as needed for itching.  CertaVite/Antioxidants Tabs Take 1 tablet by mouth daily.  hydrocortisone cream 1 % Apply topically as needed for itching.   CHANGE how you take these medications  CHANGE how you take these medications  Calcium Antacid 500 MG chewable tablet Generic drug: calcium carbonate Chew 1 tablet (200 mg of elemental calcium total) by mouth 2 (two) times daily. Purchase over the counter--for boderline low calcium levels. What changed:  medication strength how much to take when to take this reasons to take this additional instructions   STOP taking these medications  STOP taking these medications  aspirin 81 MG chewable tablet  atorvastatin 40 MG tablet Commonly known as: LIPITOR  ticagrelor 90 MG Tabs tablet Commonly known as: BRILINTA    Medication changes verified by the patient? Yes    Medication Accessibility:  Home Pharmacy: [facility]   Was the patient provided with refills on discharged medications? yes   Have all prescriptions been transferred from [facility] to home pharmacy? Already reordered     Medication Review: APIXABAN (ELIQUIS)  Apixaban 5 mg BID for 3 months - Discussed importance of taking medication around the same time everyday  - Reviewed potential DDIs with patient  - Advised patient of medications to avoid (NSAIDs, ASA)  - Educated that Tylenol (acetaminophen) will be the preferred analgesic to  prevent risk of bleeding  - Emphasized importance of monitoring for signs and symptoms of bleeding (abnormal bruising, prolonged bleeding, nose bleeds, bleeding from gums, discolored urine, black tarry stools)  - Advised patient to alert all providers of anticoagulation therapy prior to starting a new medication or having a procedure    Follow-up Appointments: Date Visit Type Length Department    04/15/2022  9:00 AM CONSULT 30 min [facility] [id number]  Patient Instructions:   Please arrive 15 minutes prior to your appointment.         04/27/2022 11:00 AM HOSPITAL FU 30 min [facility] [id number]  Patient Instructions:   Please arrive 15 minutes prior to your appointment.         05/06/2022  1:00 PM FOLLOW UP 20 min [facility] [id number]    If their condition worsens, is the pt aware to call PCP or go to the Emergency Dept.? Yes  Final Patient Assessment: Patient was unavailable, but wife stated that they just saw the doctor yesterday and everything is going well. He has stopped protonix, but otherwise no questions or concerns about his medications.   Darcus Austin, [place] 04/07/2022 3:05 PM

## 2022-04-15 ENCOUNTER — Ambulatory Visit (INDEPENDENT_AMBULATORY_CARE_PROVIDER_SITE_OTHER): Payer: 59 | Admitting: Cardiology

## 2022-04-15 ENCOUNTER — Encounter: Payer: Self-pay | Admitting: Cardiology

## 2022-04-15 VITALS — BP 114/74 | HR 87 | Ht 68.0 in | Wt 207.2 lb

## 2022-04-15 DIAGNOSIS — I63512 Cerebral infarction due to unspecified occlusion or stenosis of left middle cerebral artery: Secondary | ICD-10-CM

## 2022-04-15 NOTE — Progress Notes (Signed)
Electrophysiology Office Note:    Date:  04/15/2022   ID:  Gordon Fisher, DOB 08-02-1961, MRN [id number]  PCP:  Pcp, No  [facility] Cardiologist:  None  [facility] Electrophysiologist:  None   Referring MD: No ref. provider found   Chief Complaint: New patient consult for loop recorder  History of Present Illness:    Gordon Fisher is a 61 y.o. male who presents for an evaluation for possible loop recorder at the request of Dr. Roda Shutters. Their medical history includes CVA.  He was admitted to the hospital 02/18/2022 with right-sided weakness, right facial droop, and slurred speech. Per hospital notes, CTA showed left-MCA M1 occlusion and he underwent cerebral angio with thrombectomy and T1C13 flow by Dr. Lonzo Candy de Rodrigez.  Follow-up MRI brain done revealing small areas of acute infarct in left-MCA territory with mild hemorrhagic transformation in left parietal lobe and left-MCA bifurcation residual vascular thrombus.  He was taken back for thrombectomy with stent for dissection but had intra stent occlusion which was unable to be recanalized.  Postop CT showed small SAH which was stable on follow-up films.  Monitor 03/2022 showed HR 55 - 188, average 88 bpm, with 2 episodes of non-sustained SVT, longest lasting 7 beats. Rhythm strip appears to be atrial tachycardia.  Today, he is accompanied by a family member. We reviewed the results of his monitor in detail. Also discussed the procedural details of loop recorder implant.  Considering his stroke was about 2 months ago, they believe he is recovering well.  In the past 2-3 days his LE edema has been worsening again. They have been trying to keep his legs elevated which was helping initially.  He denies any palpitations, chest pain, or shortness of breath. No lightheadedness, headaches, syncope, orthopnea, or PND.     History reviewed. No pertinent past medical history.  Past Surgical History:  Procedure Laterality Date    BUBBLE STUDY  02/15/2022   Procedure: BUBBLE STUDY;  Surgeon: Christell Constant, MD;  Location: [facility] ENDOS[facility];  Service: Cardiovascular;;   IR CT HEAD LTD  02/11/2022   IR CT HEAD LTD  02/11/2022   IR CT HEAD LTD  02/11/2022   IR PERCUTANEOUS ART THROMBECTOMY/INFUSION INTRACRANIAL INC DIAG ANGIO  02/11/2022   IR PERCUTANEOUS ART THROMBECTOMY/INFUSION INTRACRANIAL INC DIAG ANGIO  02/11/2022   IR US GUIDE VASC ACCESS RIGHT  02/11/2022   IR US GUIDE VASC ACCESS RIGHT  02/11/2022   RADIOLOGY WITH ANESTHESIA N/A 02/11/2022   Procedure: IR WITH ANESTHESIA;  Surgeon: Radiologist, Medication, MD;  Location: [facility] OR;  Service: Radiology;  Laterality: N/A;   RADIOLOGY WITH ANESTHESIA N/A 02/11/2022   Procedure: RADIOLOGY WITH ANESTHESIA;  Surgeon: Radiologist, Medication, MD;  Location: [facility] OR;  Service: Radiology;  Laterality: N/A;   TEE WITHOUT CARDIOVERSION N/A 02/15/2022   Procedure: TRANSESOPHAGEAL ECHOCARDIOGRAM (TEE);  Surgeon: Christell Constant, MD;  Location: [facility] ENDOSCOPY;  Service: Cardiovascular;  Laterality: N/A;    Current Medications: Current Meds  Medication Sig   acetaminophen (TYLENOL) 325 MG tablet Take 1-2 tablets (325-650 mg total) by mouth every 4 (four) hours as needed for mild pain.   apixaban (ELIQUIS) 5 MG TABS tablet Take 1 tablet (5 mg total) by mouth 2 (two) times daily.   atorvastatin (LIPITOR) 40 MG tablet Take 1 tablet (40 mg total) by mouth every evening.   calcium carbonate (TUMS) 500 MG chewable tablet Chew 1 tablet (200 mg of elemental calcium total) by mouth 2 (two) times daily. Purchase  over the counter--for boderline low calcium levels.   camphor-menthol (SARNA) lotion Apply topically as needed for itching.   hydrocortisone cream 1 % Apply topically as needed for itching.   Multiple Vitamins-Minerals (CERTAVITE/ANTIOXIDANTS) TABS Take 1 tablet by mouth daily.   polyethylene glycol powder (GLYCOLAX/MIRALAX) 17 GM/SCOOP powder Take 17 g by mouth daily.   ticagrelor  (BRILINTA) 90 MG TABS tablet Take 1 tablet (90 mg total) by mouth 2 (two) times daily.   Current Facility-Administered Medications for the 04/15/22 encounter (Office Visit) with Vickie Epley, MD  Medication   diclofenac Sodium (VOLTAREN) 1 % topical gel 2 g     Allergies:   Patient has no allergy information on record.   Social History   Socioeconomic History   Marital status: Married    Spouse name: Not on file   Number of children: Not on file   Years of education: Not on file   Highest education level: Not on file  Occupational History   Not on file  Tobacco Use   Smoking status: Never    Passive exposure: Never   Smokeless tobacco: Never  Vaping Use   Vaping Use: Never used  Substance and Sexual Activity   Alcohol use: Never   Drug use: Never   Sexual activity: Never  Other Topics Concern   Not on file  Social History Narrative   Not on file   Social Determinants of Health   Financial Resource Strain: Not on file  Food Insecurity: Not on file  Transportation Needs: Not on file  Physical Activity: Not on file  Stress: Not on file  Social Connections: Not on file     Family History: The patient's family history is not on file.  ROS:   Please see the history of present illness.    (+) LE Edema All other systems reviewed and are negative.  EKGs/Labs/Other Studies Reviewed:    The following studies were reviewed today:  03/2022  Monitor: HR 55 - 188, average 88 bpm. 2 non-sustained SVT, longest lasting 7 beats. Rhythm strip appears to be atrial tachycardia. Rare supraventricular ectopy. Occasional ventricular ectopy, 1%. No sustained arrhythmias.  02/15/2022  TEE:  1. Left ventricular ejection fraction, by estimation, is 60 to 65%. The  left ventricle has normal function.   2. Right ventricular systolic function is normal. The right ventricular  size is normal.   3. No left atrial/left atrial appendage thrombus was detected. The LAA  emptying  velocity was 52 cm/s.   4. The mitral valve is normal in structure. No evidence of mitral valve  regurgitation.   5. The aortic valve is tricuspid. Aortic valve regurgitation is not  visualized.   6. There is mild (Grade II) plaque involving the descending aorta.   7. Agitated saline contrast bubble study was negative, with no evidence of any interatrial shunt.   EKG:   EKG is personally reviewed.  04/15/2022: EKG was not ordered.   Recent Labs: 02/14/2022: TSH 3.728 02/19/2022: ALT 29 03/15/2022: BUN 13; Creatinine, Ser 1.12; Hemoglobin 15.3; Platelets 230; Potassium 3.7; Sodium 135   Recent Lipid Panel    Component Value Date/Time   CHOL 198 02/12/2022 0421   TRIG 146 02/12/2022 0421   HDL 47 02/12/2022 0421   CHOLHDL 4.2 02/12/2022 0421   VLDL 29 02/12/2022 0421   LDLCALC 122 (H) 02/12/2022 0421    Physical Exam:    VS:  BP 114/74   Pulse 87   Ht 5\' 8"  (1.727 m)  Wt 207 lb 3.2 oz (94 kg)   SpO2 97%   BMI 31.50 kg/m     Wt Readings from Last 3 Encounters:  04/15/22 207 lb 3.2 oz (94 kg)  03/18/22 219 lb 2.2 oz (99.4 kg)  02/18/22 235 lb 0.2 oz (106.6 kg)     GEN: Well nourished, well developed in no acute distress HEENT: Normal; In wheelchair NECK: No JVD; No carotid bruits LYMPHATICS: No lymphadenopathy CARDIAC: RRR, no murmurs, rubs, gallops RESPIRATORY:  Clear to auscultation without rales, wheezing or rhonchi  ABDOMEN: Soft, non-tender, non-distended MUSCULOSKELETAL:  No edema; No deformity  SKIN: Warm and dry NEUROLOGIC:  Alert and oriented x 3 PSYCHIATRIC:  Normal affect       ASSESSMENT:    1. Cerebrovascular accident (CVA) due to occlusion of left middle cerebral artery (HCC)   2. Acute ischemic left middle cerebral artery (MCA) stroke (HCC)    PLAN:    In order of problems listed above:  #Cryptogenic stroke No evidence of atrial fibrillation or clear cause thus far.  He is currently on Eliquis in the short-term for a DVT.  I discussed the  role of loop recorder monitoring for patients who have had a stroke.  I discussed the loop recorder implant procedure in detail including the risks and monthly monitoring costs.  The patient understands all of this and wishes to proceed.  We will get this scheduled for him.  We will plan for an Abbott loop recorder  Total time spent with patient today 60 minutes. This includes reviewing records, evaluating the patient and coordinating care.  Medication Adjustments/Labs and Tests Ordered: Current medicines are reviewed at length with the patient today.  Concerns regarding medicines are outlined above.  No orders of the defined types were placed in this encounter.  No orders of the defined types were placed in this encounter.   I,Mathew Stumpf,acting as a Neurosurgeon for Lanier Prude, MD.,have documented all relevant documentation on the behalf of Lanier Prude, MD,as directed by  Lanier Prude, MD while in the presence of Lanier Prude, MD.  I, Lanier Prude, MD, have reviewed all documentation for this visit. The documentation on 04/15/22 for the exam, diagnosis, procedures, and orders are all accurate and complete.   Signed, Rossie Muskrat. Lalla Brothers, MD, [facility], [facility] 04/15/2022 9:42 AM    Electrophysiology [name]

## 2022-04-15 NOTE — Progress Notes (Deleted)
Electrophysiology Office Note:    Date:  04/15/2022   ID:  [name], DOB 09-06-1961, MRN [id number]  PCP:  Pcp, No  [facility] Cardiologist:  None  [facility] Electrophysiologist:  None   Referring MD: No ref. provider found   Chief Complaint: ***  History of Present Illness:    Gordon Fisher is a 61 y.o. male who presents for an evaluation of *** at the request of ***. Their medical history includes ***     No past medical history on file.  Past Surgical History:  Procedure Laterality Date   BUBBLE STUDY  02/15/2022   Procedure: BUBBLE STUDY;  Surgeon: Christell Constant, MD;  [facility];  Service: Cardiovascular;;   IR CT HEAD LTD  02/11/2022   IR CT HEAD LTD  02/11/2022   IR CT HEAD LTD  02/11/2022   IR PERCUTANEOUS ART THROMBECTOMY/INFUSION INTRACRANIAL INC DIAG ANGIO  02/11/2022   IR PERCUTANEOUS ART THROMBECTOMY/INFUSION INTRACRANIAL INC DIAG ANGIO  02/11/2022   IR US GUIDE VASC ACCESS RIGHT  02/11/2022   IR US GUIDE VASC ACCESS RIGHT  02/11/2022   RADIOLOGY WITH ANESTHESIA N/A 02/11/2022   Procedure: IR WITH ANESTHESIA;  Surgeon: Radiologist, Medication, MD;  Location: [facility] OR;  Service: Radiology;  Laterality: N/A;   RADIOLOGY WITH ANESTHESIA N/A 02/11/2022   Procedure: RADIOLOGY WITH ANESTHESIA;  Surgeon: Radiologist, Medication, MD;  Location: [facility];  Service: Radiology;  Laterality: N/A;   TEE WITHOUT CARDIOVERSION N/A 02/15/2022   Procedure: TRANSESOPHAGEAL ECHOCARDIOGRAM (TEE);  Surgeon: Christell Constant, MD;  Location: [facility] ENDOSCOPY;  Service: Cardiovascular;  Laterality: N/A;    Current Medications: No outpatient medications have been marked as taking for the 04/15/22 encounter (Appointment) with Lanier Prude, MD.   Current Facility-Administered Medications for the 04/15/22 encounter (Appointment) with Lanier Prude, MD  Medication   diclofenac Sodium (VOLTAREN) 1 % topical gel 2 g     Allergies:   Patient has no  allergy information on record.   Social History   Socioeconomic History   Marital status: Married    Spouse name: Not on file   Number of children: Not on file   Years of education: Not on file   Highest education level: Not on file  Occupational History   Not on file  Tobacco Use   Smoking status: Never    Passive exposure: Never   Smokeless tobacco: Never  Vaping Use   Vaping Use: Never used  Substance and Sexual Activity   Alcohol use: Never   Drug use: Never   Sexual activity: Never  Other Topics Concern   Not on file  Social History Narrative   Not on file   Social Determinants of Health   Financial Resource Strain: Not on file  Food Insecurity: Not on file  Transportation Needs: Not on file  Physical Activity: Not on file  Stress: Not on file  Social Connections: Not on file     Family History: The patient's family history is not on file.  ROS:   Please see the history of present illness.    All other systems reviewed and are negative.  EKGs/Labs/Other Studies Reviewed:    The following studies were reviewed today: ***  EKG:  The ekg ordered today demonstrates ***   Recent Labs: 02/14/2022: TSH 3.728 02/19/2022: ALT 29 03/15/2022: BUN 13; Creatinine, Ser 1.12; Hemoglobin 15.3; Platelets 230; Potassium 3.7; Sodium 135  Recent Lipid Panel    Component Value Date/Time  CHOL 198 02/12/2022 0421   TRIG 146 02/12/2022 0421   HDL 47 02/12/2022 0421   CHOLHDL 4.2 02/12/2022 0421   VLDL 29 02/12/2022 0421   LDLCALC 122 (H) 02/12/2022 0421    Physical Exam:    VS:  There were no vitals taken for this visit.    Wt Readings from Last 3 Encounters:  03/18/22 219 lb 2.2 oz (99.4 kg)  02/18/22 235 lb 0.2 oz (106.6 kg)     GEN: *** Well nourished, well developed in no acute distress HEENT: Normal NECK: No JVD; No carotid bruits LYMPHATICS: No lymphadenopathy CARDIAC: ***RRR, no murmurs, rubs, gallops RESPIRATORY:  Clear to auscultation without  rales, wheezing or rhonchi  ABDOMEN: Soft, non-tender, non-distended MUSCULOSKELETAL:  No edema; No deformity  SKIN: Warm and dry NEUROLOGIC:  Alert and oriented x 3 PSYCHIATRIC:  Normal affect       ASSESSMENT:    No diagnosis found. PLAN:    In order of problems listed above:         Total time spent with patient today *** minutes. This includes reviewing records, evaluating the patient and coordinating care.  Medication Adjustments/Labs and Tests Ordered: Current medicines are reviewed at length with the patient today.  Concerns regarding medicines are outlined above.  No orders of the defined types were placed in this encounter.  No orders of the defined types were placed in this encounter.    Signed, Rossie Muskrat. Lalla Brothers, MD, [facility], [facility] 04/15/2022 4:52 AM    Electrophysiology  [facility]

## 2022-04-15 NOTE — Patient Instructions (Signed)
Medication Instructions:  Your physician recommends that you continue on your current medications as directed. Please refer to the Current Medication list given to you today. *If you need a refill on your cardiac medications before your next appointment, please call your pharmacy*  Lab Work: None. If you have labs (blood work) drawn today and your tests are completely normal, you will receive your results only by: MyChart Message (if you have MyChart) OR A paper copy in the mail If you have any lab test that is abnormal or we need to change your treatment, we will call you to review the results.  Testing/Procedures: None.  Follow-Up: At [facility], you and your health needs are our priority.  As part of our continuing mission to provide you with exceptional heart care, we have created designated Provider Care Teams.  These Care Teams include your primary Cardiologist (physician) and Advanced Practice Providers (APPs -  Physician Assistants and Nurse Practitioners) who all work together to provide you with the care you need, when you need it.  Your physician wants you to follow-up in: First available with Steffanie Dunn, MD for SJ loop implant in office.   We recommend signing up for the patient portal called "MyChart".  Sign up information is provided on this After Visit Summary.  MyChart is used to connect with patients for Virtual Visits (Telemedicine).  Patients are able to view lab/test results, encounter notes, upcoming appointments, etc.  Non-urgent messages can be sent to your provider as well.   To learn more about what you can do with MyChart, go to [email or web address].    Any Other Special Instructions Will Be Listed Below (If Applicable).

## 2022-04-19 ENCOUNTER — Telehealth: Payer: Self-pay

## 2022-04-19 NOTE — Telephone Encounter (Signed)
Diedre, ST from [facility] called requesting continuation of ST for global aphagia. Order approved and given.

## 2022-04-27 ENCOUNTER — Encounter: Payer: Self-pay | Admitting: Diagnostic Neuroimaging

## 2022-04-27 ENCOUNTER — Ambulatory Visit (INDEPENDENT_AMBULATORY_CARE_PROVIDER_SITE_OTHER): Payer: 59 | Admitting: Diagnostic Neuroimaging

## 2022-04-27 VITALS — BP 120/82 | HR 79 | Ht 68.0 in | Wt 203.0 lb

## 2022-04-27 DIAGNOSIS — I6930 Unspecified sequelae of cerebral infarction: Secondary | ICD-10-CM | POA: Diagnosis not present

## 2022-05-03 ENCOUNTER — Telehealth: Payer: Self-pay

## 2022-05-03 NOTE — Telephone Encounter (Signed)
Diedre from [facility] called stating patient edema is worse, more red and swollen. Advised patient to increase water intake, elevate and avoid sodium.

## 2022-05-05 ENCOUNTER — Telehealth: Payer: Self-pay | Admitting: Cardiology

## 2022-05-05 NOTE — Telephone Encounter (Signed)
Pt c/o swelling: STAT is pt has developed SOB within 24 hours  If swelling, where is the swelling located? Right foot and ankle  How much weight have you gained and in what time span? NA  Have you gained 3 pounds in a day or 5 pounds in a week? NA  Do you have a log of your daily weights (if so, list)? NA  Are you currently taking a fluid pill? NO  Are you currently SOB? NO  Have you traveled recently? No  Pt's Home Health nurse wanted to make provider aware that pt's Edema has increased and has recommended elevating. Nurse would like a call back regarding this matter to see if there is anything that the provider may want done. Please advise

## 2022-05-06 ENCOUNTER — Encounter: Payer: Self-pay | Admitting: Physical Medicine & Rehabilitation

## 2022-05-06 ENCOUNTER — Telehealth: Payer: Self-pay

## 2022-05-06 ENCOUNTER — Encounter: Payer: 59 | Attending: Physical Medicine & Rehabilitation | Admitting: Physical Medicine & Rehabilitation

## 2022-05-06 VITALS — BP 122/85 | HR 82 | Ht 68.0 in

## 2022-05-06 DIAGNOSIS — I825Y1 Chronic embolism and thrombosis of unspecified deep veins of right proximal lower extremity: Secondary | ICD-10-CM | POA: Insufficient documentation

## 2022-05-06 DIAGNOSIS — I6930 Unspecified sequelae of cerebral infarction: Secondary | ICD-10-CM | POA: Diagnosis not present

## 2022-05-06 DIAGNOSIS — S43001D Unspecified subluxation of right shoulder joint, subsequent encounter: Secondary | ICD-10-CM | POA: Insufficient documentation

## 2022-05-06 DIAGNOSIS — R1310 Dysphagia, unspecified: Secondary | ICD-10-CM | POA: Insufficient documentation

## 2022-05-06 NOTE — Telephone Encounter (Signed)
Spoke to Diedre the SP and she states yes the patient is still taking the Eliquis.

## 2022-05-06 NOTE — Progress Notes (Signed)
Subjective:    Patient ID: Gordon Fisher, male    DOB: 05/15/1961, 61 y.o.   MRN: [id number]  HPI  Brief HPI:   Gordon Fisher is a 61 y.o. male in relatively good health was admitted via [facility] on 02/11/2022 with right-sided weakness, right facial droop and slurred speech.  CT H showed left-MCA hyperdense sign and he reports sleep TNK prior to transfer to [facility].  CTA showed left-MCA M1 occlusion and he underwent cerebral angio with thrombectomy and T1C13 flow by Dr. Lonzo Candy de Rodrigez.  Follow-up MRI brain done revealing small areas of acute infarct in left-MCA territory with mild hemorrhagic transformation in left parietal lobe and left-MCA bifurcation residual vascular thrombus.  He was taken back for thrombectomy with stent for dissection but had intra stent occlusion which was unable to be recanalized.  Postop CT showed small SAH which was stable on follow-up films.   He was started on ASA/Brilinta as well as tube feeds for nutritional support until MBS of 04/18 when he was advanced to D3 diet with supervision.  Repeat MRI brain 04/16 showed extensive evolving acute ischemic left-MCA occlusion increasing in size with associated edema, few additional small acute cortical/subcortical infarcts involving contralateral right frontal lobe and left ACA distribution.  Stroke felt to be due to unknown etiology and wife elected on Zio patch which was placed prior to discharge to rule out A-fib.  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  CIR was recommended due to functional decline.      Hospital Course: Gordon Fisher was admitted to rehab 02/18/2022 for inpatient therapies to consist of PT, ST and OT at least three hours five days a week. Past admission physiatrist, therapy team and rehab RN have worked together to provide customized collaborative inpatient rehab.  His blood pressures were monitored on  TID basis and has been controlled without medications. He was noted to have hypocalcemia with ionized calcium at 1.11 therefore Tums was added for calcium supplementation twice daily.  Check of labs at admission showed AKI which has resolved with serial checks.   He was noted to be febrile on 05/05 and UA done was positive therefore Rocephin was added for treatment of Enterobacter UTI.Gordon Fisher  CXR was NAD. BLE Dopplers also ordered for work-up and showed acute right posterior tib/gastroc DVT.  This was monitored with repeat Dopplers 5/10 which showed persistent DVT and he was cleared to start Eliquis per discussion with Dr. Xu/neurology. ASA was discontinued and he was maintained on Eliquis and Brilinta during his stay.  Follow-up CBC shows reactive leukocytosis has resolved and H&H is stable. His blood sugars were initially monitored on ACHS basis due to hyperglycemia from tube feeds.  As intake improved, tube feeds were discontinued and he he is tolerating regular diet without difficulty.     His blood sugars were noted to be improved of tube feeds therefore CBG checks were discontinued and patient and family has been educated on heart healthy carb modified diet.  P.o. intake has been good and he is continent of bowel and bladder.  Right foot x-rays were done due to reports of pain and was negative for fracture.  He continues on Lipitor for elevated LDL.   He has made steady gains during his stay and requires min to mod assist at wheelchair level.  Patient declined extending length of stay and home health therapy is in process of being set  is awaiting responses regarding acceptance from couple more agencies.     Rehab course: During patient's stay in rehab weekly team conferences were held to monitor patient's progress, set goals and discuss barriers to discharge. At admission, patient required mod to total assist with basic ADL task and total assist with mobility.  He exhibited severe expressive and receptive  aphasia with verbal output but limited to yeah and required dysphagia 3 diet with full supervision for safety. He has had improvement in activity tolerance, balance, postural control as well as ability to compensate for deficits. He is able to dress with min to mod assist and requires mod assist for most ADL tasks.  He has had significant improvement in motor planning as well as decrease in right inattention.   He requires min assist for squat pivot transfers versus use of study.  He is able to perform car transfers with heavy min assist.  He requires min to mod assist for standing.  He is able to ambulate 100 feet with +2 mod assist with skilled therapists. He requires min assist for basic comprehension and mod to max assist for communicating via Monday model communication.  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  He is tolerating regular diet without signs or symptoms of aspiration.  Family education was completed with wife and sister-in-law.    Interval History Patient is a 61 year old male who recently completed CIR after he had a left MCA CVA.  He is here with his wife.  He is here for for follow-up after his CVA.  Patient has had some increased swelling in his right lower extremity over the past few weeks.  His foot has also had a darker color since this time.  This swelling  color improves with elevation of his leg.  [facility]  He continues to have shoulder pain however this is improved with Tylenol.  His wife reports he is doing well overall and has improved in his communication.  Wife reports he is planned for a loop recorder, she wants to know if patient will be able to use a TENS unit for his right shoulder, e-stim for the foot with therapy when he gets displaced in a few weeks.   Pain Inventory Average Pain 4 Pain Right Now 3 My pain is intermittent, tingling, and aching  LOCATION OF PAIN   right shoulder, right leg, right side of body  BOWEL Number of stools per week: 7 Oral laxative use Yes  Type of laxative Miralax only when needed Enema or suppository use No   BLADDER Pads  Mobility ability to climb steps?  no do you drive?  no use a wheelchair needs help with transfers Do you have any goals in this area?  yes  Function retired I need assistance with the following:  feeding, dressing, bathing, toileting, meal prep, household duties, and shopping Do you have any goals in this area?  yes  Neuro/Psych numbness tingling trouble walking  Prior Studies Any changes since last visit?  no  Physicians involved in your care Any changes since last visit?  no   Family History  Problem Relation Age of Onset   Thyroid disease Mother    Social History   Socioeconomic History   Marital status: Married    Spouse name: [name]   Number of children: 2   Years of education: Not on file   Highest education level:  Associate degree: occupational, technical, or vocational program  Occupational History   Not on file  Tobacco Use   Smoking status: Never    Passive exposure: Never   Smokeless tobacco: Never  Vaping Use   Vaping Use: Never used  Substance and Sexual Activity   Alcohol use: Never   Drug use: Never   Sexual activity: Never  Other Topics Concern   Not on file  Social History Narrative   04/27/22 lives with wife   Social Determinants of Health   Financial Resource Strain: Not on file  Food Insecurity: Not on file  Transportation Needs: Not on file  Physical Activity: Not on file  Stress: Not on file  Social Connections: Not on file   Past Surgical History:  Procedure Laterality Date   BUBBLE STUDY  02/15/2022   Procedure: BUBBLE STUDY;  Surgeon: Christell Constant, MD;  Location: [facility];  Service: Cardiovascular;;   IR CT HEAD LTD  02/11/2022   IR CT HEAD LTD  02/11/2022   IR CT HEAD LTD  02/11/2022   IR PERCUTANEOUS ART  THROMBECTOMY/INFUSION INTRACRANIAL INC DIAG ANGIO  02/11/2022   IR PERCUTANEOUS ART THROMBECTOMY/INFUSION INTRACRANIAL INC DIAG ANGIO  02/11/2022   IR US GUIDE VASC ACCESS RIGHT  02/11/2022   IR US GUIDE VASC ACCESS RIGHT  02/11/2022   RADIOLOGY WITH ANESTHESIA N/A 02/11/2022   Procedure: IR WITH ANESTHESIA;  Surgeon: Radiologist, Medication, MD;  Location: [facility] OR;  Service: Radiology;  Laterality: N/A;   RADIOLOGY WITH ANESTHESIA N/A 02/11/2022   Procedure: RADIOLOGY WITH ANESTHESIA;  Surgeon: Radiologist, Medication, MD;  Location: [facility] OR;  Service: Radiology;  Laterality: N/A;   TEE WITHOUT CARDIOVERSION N/A 02/15/2022   Procedure: TRANSESOPHAGEAL ECHOCARDIOGRAM (TEE);  Surgeon: Christell Constant, MD;  Location: [facility] ENDOSCOPY;  Service: Cardiovascular;  Laterality: N/A;   Past Medical History:  Diagnosis Date   Stroke (HCC)    Ht 5\' 8"  (1.727 m)   BMI 30.87 kg/m   Opioid Risk Score:   Fall Risk Score:  `1  Depression screen [facility] 2/9     05/06/2022    1:07 PM 04/01/2022    1:38 PM  Depression screen PHQ 2/9  Decreased Interest 0 0  Down, Depressed, Hopeless 0 0  PHQ - 2 Score 0 0  Altered sleeping  0  Tired, decreased energy  0  Change in appetite  0  Feeling bad or failure about yourself   0  Trouble concentrating  0  Moving slowly or fidgety/restless  0  Suicidal thoughts  0  PHQ-9 Score  0    Review of Systems  Cardiovascular:  Positive for leg swelling (Swelling in right foot and ankle with tightness).  Musculoskeletal:  Positive for gait problem.  Neurological:  Positive for speech difficulty, weakness and numbness.       Tingling       Objective:   Physical Exam   Gen: no distress, normal appearing HEENT: oral mucosa pink and moist, NCAT Cardio: Reg rate Chest: normal effort, normal rate of breathing Abd: soft, non-distended Ext: RLE with mild edema, darker skin, peripheral pulses intact Psych: pleasant, normal affect Skin: intact Neuro: Aphasia primarily  expressive at this point.  He is able to nod yes or shake his head no.  He follows commands the majority of the time.  He also uses gestures to communicate. Musculoskeletal: Strength 5 out of 5 left lower and left upper extremity Strength 0 out of 5 right lower extremity and right upper  extremity Shoulder subluxation noted on the right Mildly increased tone in the right upper extremity     Assessment & Plan:   Left MCA CVA  -Continue PT/OT/SLP -I do not think the TENS or e-stim unit will be contraindicated when he gets his loop recorder however I advised him to asked the cardiology when he is getting this placed to make sure   2. R shoulder pain -Suspect related to shoulder subluxation, continue Tylenol -Continue occasional use of sling to prevent injury -Continue arm trough -Patient declined shoulder injection    3.  Dysphagia, continue work with speech therapy   4. DVT right gastroc/peroneal -Continue Eliquis 5 twice daily, plans for 3 months of treatment,  was started on 03/12/2022 -We will recheck duplex -Suspect swelling more likely due to immobilization due to dense hemiplegia or possibly some early development of post thrombotic syndrome, advised continued elevation of extremity and use of compression stockings

## 2022-05-06 NOTE — Telephone Encounter (Signed)
Gordon Fisher, OT called to get a verbal order to decrease OT to once a week for 2 weeks. She stated he is making great progress and is starting to reach the max for OT. Left voicemail to give verbal orders

## 2022-05-07 ENCOUNTER — Telehealth: Payer: Self-pay | Admitting: *Deleted

## 2022-05-07 DIAGNOSIS — I824Y1 Acute embolism and thrombosis of unspecified deep veins of right proximal lower extremity: Secondary | ICD-10-CM

## 2022-05-07 DIAGNOSIS — I825Y1 Chronic embolism and thrombosis of unspecified deep veins of right proximal lower extremity: Secondary | ICD-10-CM

## 2022-05-07 DIAGNOSIS — M7989 Other specified soft tissue disorders: Secondary | ICD-10-CM

## 2022-05-07 NOTE — Telephone Encounter (Signed)
No number to call home health nurse back. When she calls back will refer her to patient's PCP. Dr. Lalla Brothers saw patient about loop recorder due to stroke.

## 2022-05-07 NOTE — Telephone Encounter (Signed)
Mrs Gordon Fisher have not heard from vascular US and his leg is worsening with knots in it. Reports therapist voiced concern about his leg. I have changed the LE doppler study to a STAT and called the vascular lab and they will call and get him in right away.

## 2022-05-10 ENCOUNTER — Ambulatory Visit (HOSPITAL_COMMUNITY)
Admission: RE | Admit: 2022-05-10 | Discharge: 2022-05-10 | Disposition: A | Discharge status: Home / Self Care | Payer: 59 | Source: Ambulatory Visit | Attending: Physical Medicine & Rehabilitation | Admitting: Physical Medicine & Rehabilitation

## 2022-05-10 DIAGNOSIS — M7989 Other specified soft tissue disorders: Secondary | ICD-10-CM | POA: Diagnosis not present

## 2022-05-10 DIAGNOSIS — I824Y1 Acute embolism and thrombosis of unspecified deep veins of right proximal lower extremity: Secondary | ICD-10-CM | POA: Insufficient documentation

## 2022-05-10 DIAGNOSIS — I825Y1 Chronic embolism and thrombosis of unspecified deep veins of right proximal lower extremity: Secondary | ICD-10-CM | POA: Insufficient documentation

## 2022-05-10 NOTE — Progress Notes (Signed)
VASCULAR LAB    Right lower extremity venous duplex has been performed.  See CV proc for preliminary results.   Afiya Ferrebee, RVT 05/10/2022, 8:47 AM

## 2022-05-11 ENCOUNTER — Telehealth (HOSPITAL_COMMUNITY): Payer: Self-pay | Admitting: Physical Medicine & Rehabilitation

## 2022-05-11 NOTE — Telephone Encounter (Signed)
Per Dr Natale Lay:  Called to discuss vas U/S results, continue compression stockings and elevation

## 2022-05-14 ENCOUNTER — Other Ambulatory Visit: Payer: Self-pay | Admitting: Physical Medicine & Rehabilitation

## 2022-05-28 ENCOUNTER — Other Ambulatory Visit: Payer: Self-pay

## 2022-05-28 NOTE — Patient Outreach (Signed)
[facility]) Care Management  05/28/2022  Gordon Fisher 09-23-1961 [id number]   Telephone outreach to patient to obtain mRS was successfully completed. MRS= 5  [name] [facility] Care Management Assistant [phone]

## 2022-06-06 NOTE — Progress Notes (Deleted)
Electrophysiology Office Follow up Visit Note:    Date:  06/06/2022   ID:  Gordon Fisher, DOB [birth year], MRN [id number]  PCP:  Pcp, No  [facility] Cardiologist:  None  [facility] Electrophysiologist:  None    Interval History:    Gordon Fisher is a 61 y.o. male who presents for a follow up visit. They were last seen in clinic 04/15/2022 for CVA. That appointment was after admission in April 2023 for stroke. At our last appointment, the patient elected to proceed with ILR. He presents today for this implant.    Past Medical History:  Diagnosis Date   Stroke [facility])     Past Surgical History:  Procedure Laterality Date   BUBBLE STUDY  02/15/2022   Procedure: BUBBLE STUDY;  Surgeon: Christell Constant, MD;  [facility];  Service: Cardiovascular;;   IR CT HEAD LTD  02/11/2022   IR CT HEAD LTD  02/11/2022   IR CT HEAD LTD  02/11/2022   IR PERCUTANEOUS ART THROMBECTOMY/INFUSION INTRACRANIAL INC DIAG ANGIO  02/11/2022   IR PERCUTANEOUS ART THROMBECTOMY/INFUSION INTRACRANIAL INC DIAG ANGIO  02/11/2022   IR US GUIDE VASC ACCESS RIGHT  02/11/2022   IR US GUIDE VASC ACCESS RIGHT  02/11/2022   RADIOLOGY WITH ANESTHESIA N/A 02/11/2022   Procedure: IR WITH ANESTHESIA;  Surgeon: Radiologist, Medication, MD;  Location: [facility] OR;  Service: Radiology;  Laterality: N/A;   RADIOLOGY WITH ANESTHESIA N/A 02/11/2022   Procedure: RADIOLOGY WITH ANESTHESIA;  Surgeon: Radiologist, Medication, MD;  Location: [facility] OR;  Service: Radiology;  Laterality: N/A;   TEE WITHOUT CARDIOVERSION N/A 02/15/2022   Procedure: TRANSESOPHAGEAL ECHOCARDIOGRAM (TEE);  Surgeon: Christell Constant, MD;  Location: [facility] ENDOSCOPY;  Service: Cardiovascular;  Laterality: N/A;    Current Medications: No outpatient medications have been marked as taking for the 06/07/22 encounter (Appointment) with Lanier Prude, MD.   Current Facility-Administered Medications for the 06/07/22 encounter (Appointment) with Lanier Prude, MD  Medication   diclofenac Sodium (VOLTAREN) 1 % topical gel 2 g     Allergies:   Patient has no known allergies.   Social History   Socioeconomic History   Marital status: Married    Spouse name: [name]   Number of children: 2   Years of education: Not on file   Highest education level: Associate degree: occupational, Scientist, product/process development, or vocational program  Occupational History   Not on file  Tobacco Use   Smoking status: Never    Passive exposure: Never   Smokeless tobacco: Never  Vaping Use   Vaping Use: Never used  Substance and Sexual Activity   Alcohol use: Never   Drug use: Never   Sexual activity: Never  Other Topics Concern   Not on file  Social History Narrative   04/27/22 lives with wife   Social Determinants of Health   Financial Resource Strain: Not on file  Food Insecurity: Not on file  Transportation Needs: Not on file  Physical Activity: Not on file  Stress: Not on file  Social Connections: Not on file     Family History: The patient's family history includes Thyroid disease in his mother.  ROS:   Please see the history of present illness.    All other systems reviewed and are negative.  EKGs/Labs/Other Studies Reviewed:    The following studies were reviewed today:     Recent Labs: 02/14/2022: TSH 3.728 02/19/2022: ALT 29 03/15/2022: BUN 13; Creatinine, Ser 1.12; Hemoglobin 15.3; Platelets 230; Potassium  3.7; Sodium 135  Recent Lipid Panel    Component Value Date/Time   CHOL 198 02/12/2022 0421   TRIG 146 02/12/2022 0421   HDL 47 02/12/2022 0421   CHOLHDL 4.2 02/12/2022 0421   VLDL 29 02/12/2022 0421   LDLCALC 122 (H) 02/12/2022 0421    Physical Exam:    VS:  There were no vitals taken for this visit.    Wt Readings from Last 3 Encounters:  04/27/22 203 lb (92.1 kg)  04/15/22 207 lb 3.2 oz (94 kg)  03/18/22 219 lb 2.2 oz (99.4 kg)     GEN: *** Well nourished, well developed in no acute distress HEENT: Normal NECK: No  JVD; No carotid bruits LYMPHATICS: No lymphadenopathy CARDIAC: ***RRR, no murmurs, rubs, gallops RESPIRATORY:  Clear to auscultation without rales, wheezing or rhonchi  ABDOMEN: Soft, non-tender, non-distended MUSCULOSKELETAL:  No edema; No deformity  SKIN: Warm and dry NEUROLOGIC:  Alert and oriented x 3 PSYCHIATRIC:  Normal affect        ASSESSMENT:    No diagnosis found. PLAN:    In order of problems listed above:  #Cryptogenic stroke No evidence of AF thus far. Reviewed ILR today including procedural risks and monthly monitoring costs and he would like to proceed with implant.     Total time spent with patient today *** minutes. This includes reviewing records, evaluating the patient and coordinating care.   Medication Adjustments/Labs and Tests Ordered: Current medicines are reviewed at length with the patient today.  Concerns regarding medicines are outlined above.  No orders of the defined types were placed in this encounter.  No orders of the defined types were placed in this encounter.    Signed, Steffanie Dunn, MD, [facility], [facility] 06/06/2022 3:29 PM    Electrophysiology [facility]  --------------------  SURGEON:  Lanier Prude, MD     PREPROCEDURE DIAGNOSIS:  Cryptogenic stroke    POSTPROCEDURE DIAGNOSIS: Cryptogenic stroke     PROCEDURES:   1. Implantable loop recorder implantation    INTRODUCTION:  Gordon Fisher presents with a history of cryptogenic stroke The costs of loop recorder monitoring have been discussed with the patient.    DESCRIPTION OF PROCEDURE:  Informed written consent was obtained.  The patient required no sedation for the procedure today.  Mapping over the patient's chest was performed to identify the area where electrograms were most prominent for ILR recording.  This area was found to be the left parasternal region over the 4th intercostal space. The patients left chest was therefore prepped and draped  in the usual sterile fashion. The skin overlying the left parasternal region was infiltrated with lidocaine for local analgesia.  A 0.5-cm incision was made over the left parasternal region over the 3rd intercostal space.  A subcutaneous ILR pocket was fashioned using a combination of sharp and blunt dissection.  A Abbott Assert IQ EL+ implantable loop recorder was then placed into the pocket  R waves were very prominent and measured >0.9mV.  Steri- Strips and a sterile dressing were then applied.  There were no early apparent complications.     CONCLUSIONS:   1. Successful implantation of a implantable loop recorder for Cryptogenic stroke  2. No early apparent complications.   Sheria Lang T. Lalla Brothers, MD, [facility], [facility] Cardiac Electrophysiology

## 2022-06-07 ENCOUNTER — Encounter: Payer: Self-pay | Admitting: Cardiology

## 2022-06-07 ENCOUNTER — Telehealth: Payer: Self-pay | Admitting: Cardiology

## 2022-06-07 ENCOUNTER — Ambulatory Visit (INDEPENDENT_AMBULATORY_CARE_PROVIDER_SITE_OTHER): Payer: 59 | Admitting: Cardiology

## 2022-06-07 VITALS — BP 98/64 | HR 87 | Ht 68.0 in | Wt 192.0 lb

## 2022-06-07 DIAGNOSIS — I63512 Cerebral infarction due to unspecified occlusion or stenosis of left middle cerebral artery: Secondary | ICD-10-CM

## 2022-06-07 NOTE — Telephone Encounter (Signed)
Okay, patient's visit completed in clinic.

## 2022-06-07 NOTE — Telephone Encounter (Signed)
Pt c/o medication issue:  1. Name of Medication: apixaban (ELIQUIS) 5 MG TABS tablet  2. How are you currently taking this medication (dosage and times per day)? Did not take today  3. Are you having a reaction (difficulty breathing--STAT)? no  4. What is your medication issue? Patient has an appt today at 12:15pm for a loop implant with Dr. Lalla Brothers, patients wife did not give the patient his blood thinners this morning and she wants to know if it is OK to give to him. Please advise, state they are leaving the house in about 20 minutes.

## 2022-06-07 NOTE — Patient Instructions (Signed)
Medication Instructions:  Your physician recommends that you continue on your current medications as directed. Please refer to the Current Medication list given to you today.  Labwork: None ordered.  Testing/Procedures: None ordered.  Follow-Up:  Your physician wants you to follow-up in: 6 months with Luster Landsberg or Mardelle Matte.    Implantable Loop Recorder Placement, Care After This sheet gives you information about how to care for yourself after your procedure. Your health care provider may also give you more specific instructions. If you have problems or questions, contact your health care provider. What can I expect after the procedure? After the procedure, it is common to have: Soreness or discomfort near the incision. Some swelling or bruising near the incision.  Follow these instructions at home: Incision care  Monitor your cardiac device site for redness, swelling, and drainage. Call the device clinic at [phone] if you experience these symptoms or fever/chills.  Keep the large square bandage on your site for 24 hours and then you may remove it yourself. Keep the steri-strips underneath in place.   You may shower after 72 hours / 3 days from your procedure with the steri-strips in place. They will usually fall off on their own, or may be removed after 10 days. Pat dry.   Avoid lotions, ointments, or perfumes over your incision until it is well-healed.  Please do not submerge in water until your site is completely healed.   Your device is MRI compatible.   Remote monitoring is used to monitor your cardiac device from home. This monitoring is scheduled every month by our office. It allows [place] to keep an eye on the function of your device to ensure it is working properly.  If your wound site starts to bleed apply pressure.    For help with the monitor please call [employer] Monitor Support Specialist directly at [phone].    If you have any questions/concerns please call the  device clinic at [phone].  Activity  Return to your normal activities.  General instructions Follow instructions from your health care provider about how to manage your implantable loop recorder and transmit the information. Learn how to activate a recording if this is necessary for your type of device. You may go through a metal detection gate, and you may let someone hold a metal detector over your chest. Show your ID card if needed. Do not have an MRI unless you check with your health care provider first. Take over-the-counter and prescription medicines only as told by your health care provider. Keep all follow-up visits as told by your health care provider. This is important. Contact a health care provider if: You have redness, swelling, or pain around your incision. You have a fever. You have pain that is not relieved by your pain medicine. You have triggered your device because of fainting (syncope) or because of a heartbeat that feels like it is racing, slow, fluttering, or skipping (palpitations). Get help right away if you have: Chest pain. Difficulty breathing. Summary After the procedure, it is common to have soreness or discomfort near the incision. Change your dressing as told by your health care provider. Follow instructions from your health care provider about how to manage your implantable loop recorder and transmit the information. Keep all follow-up visits as told by your health care provider. This is important. This information is not intended to replace advice given to you by your health care provider. Make sure you discuss any questions you have with your health care provider. Document  Released: 09/29/2015 Document Revised: 12/03/2017 Document Reviewed: 12/03/2017 Elsevier Patient Education  2020 [employer].

## 2022-06-07 NOTE — Progress Notes (Signed)
Electrophysiology Office Follow up Visit Note:    Date:  06/07/2022   ID:  Gordon Fisher, DOB 10/27/1961, MRN [id number]  PCP:  Pcp, No  [facility] Cardiologist:  None  [facility] Electrophysiologist:  Lanier Prude, MD    Interval History:    Gordon Fisher is a 61 y.o. male who presents for a follow up visit. They were last seen in clinic 04/15/2022 for CVA. That appointment was after admission in April 2023 for stroke. At our last appointment, the patient elected to proceed with ILR. He presents today for this implant.  Today, he is accompanied by a family member.   They report that he has been compliant with his anticoagulation. This was held this morning only. He has been on Eliquis for about 3 months now.  Recently his physical therapist recommended the use of AFO stimulators to his legs.  They deny any palpitations, chest pain, shortness of breath, or peripheral edema. No lightheadedness, headaches, syncope, orthopnea, or PND.      Past Medical History:  Diagnosis Date   Stroke [facility])     Past Surgical History:  Procedure Laterality Date   BUBBLE STUDY  02/15/2022   Procedure: BUBBLE STUDY;  Surgeon: Christell Constant, MD;  [facility];  Service: Cardiovascular;;   IR CT HEAD LTD  02/11/2022   IR CT HEAD LTD  02/11/2022   IR CT HEAD LTD  02/11/2022   IR PERCUTANEOUS ART THROMBECTOMY/INFUSION INTRACRANIAL INC DIAG ANGIO  02/11/2022   IR PERCUTANEOUS ART THROMBECTOMY/INFUSION INTRACRANIAL INC DIAG ANGIO  02/11/2022   IR US GUIDE VASC ACCESS RIGHT  02/11/2022   IR US GUIDE VASC ACCESS RIGHT  02/11/2022   RADIOLOGY WITH ANESTHESIA N/A 02/11/2022   Procedure: IR WITH ANESTHESIA;  Surgeon: Radiologist, Medication, MD;  Location: [facility] OR;  Service: Radiology;  Laterality: N/A;   RADIOLOGY WITH ANESTHESIA N/A 02/11/2022   Procedure: RADIOLOGY WITH ANESTHESIA;  Surgeon: Radiologist, Medication, MD;  Location: [facility] OR;  Service: Radiology;  Laterality: N/A;    TEE WITHOUT CARDIOVERSION N/A 02/15/2022   Procedure: TRANSESOPHAGEAL ECHOCARDIOGRAM (TEE);  Surgeon: Christell Constant, MD;  Location: [facility] ENDOSCOPY;  Service: Cardiovascular;  Laterality: N/A;    Current Medications: Current Meds  Medication Sig   acetaminophen (TYLENOL) 325 MG tablet Take 1-2 tablets (325-650 mg total) by mouth every 4 (four) hours as needed for mild pain.   apixaban (ELIQUIS) 5 MG TABS tablet TAKE ONE (1) TABLET BY MOUTH TWO (2) TIMES DAILY   atorvastatin (LIPITOR) 40 MG tablet Take 1 tablet (40 mg total) by mouth every evening.   calcium carbonate (TUMS) 500 MG chewable tablet Chew 1 tablet (200 mg of elemental calcium total) by mouth 2 (two) times daily. Purchase over the counter--for boderline low calcium levels.   polyethylene glycol powder (GLYCOLAX/MIRALAX) 17 GM/SCOOP powder Take 17 g by mouth daily.   ticagrelor (BRILINTA) 90 MG TABS tablet Take 1 tablet (90 mg total) by mouth 2 (two) times daily.   Current Facility-Administered Medications for the 06/07/22 encounter (Office Visit) with Lanier Prude, MD  Medication   diclofenac Sodium (VOLTAREN) 1 % topical gel 2 g     Allergies:   Patient has no known allergies.   Social History   Socioeconomic History   Marital status: Married    Spouse name: [name]   Number of children: 2   Years of education: Not on file   Highest education level: Associate degree: occupational, Scientist, product/process development, or vocational program  Occupational History   Not on file  Tobacco Use   Smoking status: Never    Passive exposure: Never   Smokeless tobacco: Never  Vaping Use   Vaping Use: Never used  Substance and Sexual Activity   Alcohol use: Never   Drug use: Never   Sexual activity: Never  Other Topics Concern   Not on file  Social History Narrative   04/27/22 lives with wife   Social Determinants of Health   Financial Resource Strain: Not on file  Food Insecurity: Not on file  Transportation Needs: Not on file   Physical Activity: Not on file  Stress: Not on file  Social Connections: Not on file     Family History: The patient's family history includes Thyroid disease in his mother.  ROS:   Please see the history of present illness.    All other systems reviewed and are negative.  EKGs/Labs/Other Studies Reviewed:    The following studies were reviewed today:   EKG:  EKG is personally reviewed.  06/07/2022:  EKG was not ordered.  Recent Labs: 02/14/2022: TSH 3.728 02/19/2022: ALT 29 03/15/2022: BUN 13; Creatinine, Ser 1.12; Hemoglobin 15.3; Platelets 230; Potassium 3.7; Sodium 135   Recent Lipid Panel    Component Value Date/Time   CHOL 198 02/12/2022 0421   TRIG 146 02/12/2022 0421   HDL 47 02/12/2022 0421   CHOLHDL 4.2 02/12/2022 0421   VLDL 29 02/12/2022 0421   LDLCALC 122 (H) 02/12/2022 0421    Physical Exam:    VS:  BP 98/64   Pulse 87   Ht 5\' 8"  (1.727 m)   Wt 192 lb (87.1 kg)   SpO2 94%   BMI 29.19 kg/m     Wt Readings from Last 3 Encounters:  06/07/22 192 lb (87.1 kg)  04/27/22 203 lb (92.1 kg)  04/15/22 207 lb 3.2 oz (94 kg)     GEN: Well nourished, well developed in no acute distress; In wheelchair. HEENT: Normal NECK: No JVD; No carotid bruits LYMPHATICS: No lymphadenopathy CARDIAC: RRR, no murmurs, rubs, gallops RESPIRATORY:  Clear to auscultation without rales, wheezing or rhonchi  ABDOMEN: Soft, non-tender, non-distended MUSCULOSKELETAL:  No edema; No deformity  SKIN: Warm and dry NEUROLOGIC:  Alert and oriented x 3 PSYCHIATRIC:  Normal affect        ASSESSMENT:    1. Cerebrovascular accident (CVA) due to occlusion of left middle cerebral artery (HCC)    PLAN:    In order of problems listed above:  #Cryptogenic stroke No evidence of AF thus far. Reviewed ILR today including procedural risks and monthly monitoring costs and he would like to proceed with implant.   Medication Adjustments/Labs and Tests Ordered: Current medicines are  reviewed at length with the patient today.  Concerns regarding medicines are outlined above.  No orders of the defined types were placed in this encounter.  No orders of the defined types were placed in this encounter.   I,Mathew Stumpf,acting as a 04/17/22 for Neurosurgeon, MD.,have documented all relevant documentation on the behalf of Lanier Prude, MD,as directed by  Lanier Prude, MD while in the presence of Lanier Prude, MD.  I, Lanier Prude, MD, have reviewed all documentation for this visit. The documentation on 06/07/22 for the exam, diagnosis, procedures, and orders are all accurate and complete.   Signed, 08/07/22, MD, [facility], [facility] 06/07/2022 1:06 PM    Electrophysiology [facility]   ----------------  SURGEON:  Lanier Prude, MD     PREPROCEDURE DIAGNOSIS:  Cryptogenic stroke    POSTPROCEDURE DIAGNOSIS: Cryptogenic stroke     PROCEDURES:   1. Implantable loop recorder implantation    INTRODUCTION:  WATSON ROBARGE presents with a history of cryptogenic stroke The costs of loop recorder monitoring have been discussed with the patient.    DESCRIPTION OF PROCEDURE:  Informed written consent was obtained.  The patient required no sedation for the procedure today.  Mapping over the patient's chest was performed to identify the area where electrograms were most prominent for ILR recording.  This area was found to be the left parasternal region over the 4th intercostal space. The patients left chest was therefore prepped and draped in the usual sterile fashion. The skin overlying the left parasternal region was infiltrated with lidocaine for local analgesia.  A 0.5-cm incision was made over the left parasternal region over the 3rd intercostal space.  A subcutaneous ILR pocket was fashioned using a combination of sharp and blunt dissection.  A Abbott Assert IQ EL+ ([id number]) implantable loop recorder was then placed into the  pocket  R waves were very prominent and measured >0.41mV.  Steri- Strips and a sterile dressing were then applied.  There were no early apparent complications.     CONCLUSIONS:   1. Successful implantation of a implantable loop recorder for Cryptogenic stroke  2. No early apparent complications.   Sheria Lang T. Lalla Brothers, MD, [facility], [facility] Cardiac Electrophysiology

## 2022-06-16 ENCOUNTER — Telehealth: Payer: Self-pay

## 2022-06-16 NOTE — Telephone Encounter (Signed)
Gordon Fisher, PT/[facility] more visit 1wk4. Approved

## 2022-07-09 ENCOUNTER — Ambulatory Visit (INDEPENDENT_AMBULATORY_CARE_PROVIDER_SITE_OTHER): Payer: 59

## 2022-07-09 ENCOUNTER — Telehealth: Payer: Self-pay | Admitting: *Deleted

## 2022-07-09 DIAGNOSIS — I63512 Cerebral infarction due to unspecified occlusion or stenosis of left middle cerebral artery: Secondary | ICD-10-CM

## 2022-07-09 NOTE — Telephone Encounter (Signed)
Jetty Peeks called and says he is finishing up with [facility] rehab and will be ready to transition to outpt rehab. He lives in [place] so the closest for Neuro rehab would be [place]. I have advised her to tell the therapist to let us know when they are ready to discharge ( she says PT next week and speech the week after) and we will put the referrals in for therapy at [facility] location.

## 2022-07-14 LAB — CUP PACEART REMOTE DEVICE CHECK
Date Time Interrogation Session: 20230909103637
Implantable Pulse Generator Implant Date: 20230807
Pulse Gen Serial Number: 511016023

## 2022-07-15 ENCOUNTER — Telehealth: Payer: Self-pay | Admitting: *Deleted

## 2022-07-15 DIAGNOSIS — I639 Cerebral infarction, unspecified: Secondary | ICD-10-CM

## 2022-07-15 DIAGNOSIS — R1310 Dysphagia, unspecified: Secondary | ICD-10-CM

## 2022-07-15 NOTE — Telephone Encounter (Signed)
Corky Crafts called and said her husbands thereapy is ending this week and they want to go to [facility]. Referrals placed.

## 2022-07-23 NOTE — Progress Notes (Signed)
Merlin loop recorder 

## 2022-07-26 ENCOUNTER — Ambulatory Visit: Payer: 59

## 2022-07-26 ENCOUNTER — Ambulatory Visit: Payer: 59 | Attending: Physical Medicine & Rehabilitation | Admitting: Occupational Therapy

## 2022-07-26 ENCOUNTER — Other Ambulatory Visit: Payer: Self-pay

## 2022-07-26 DIAGNOSIS — R262 Difficulty in walking, not elsewhere classified: Secondary | ICD-10-CM | POA: Insufficient documentation

## 2022-07-26 DIAGNOSIS — M6281 Muscle weakness (generalized): Secondary | ICD-10-CM | POA: Diagnosis present

## 2022-07-26 DIAGNOSIS — I69351 Hemiplegia and hemiparesis following cerebral infarction affecting right dominant side: Secondary | ICD-10-CM | POA: Insufficient documentation

## 2022-07-26 DIAGNOSIS — R4701 Aphasia: Secondary | ICD-10-CM

## 2022-07-26 DIAGNOSIS — R2681 Unsteadiness on feet: Secondary | ICD-10-CM

## 2022-07-26 DIAGNOSIS — R2689 Other abnormalities of gait and mobility: Secondary | ICD-10-CM | POA: Insufficient documentation

## 2022-07-26 DIAGNOSIS — R278 Other lack of coordination: Secondary | ICD-10-CM | POA: Diagnosis present

## 2022-07-26 DIAGNOSIS — R208 Other disturbances of skin sensation: Secondary | ICD-10-CM | POA: Diagnosis present

## 2022-07-26 DIAGNOSIS — R482 Apraxia: Secondary | ICD-10-CM | POA: Diagnosis present

## 2022-07-26 NOTE — Therapy (Signed)
OUTPATIENT OCCUPATIONAL THERAPY NEURO EVALUATION  Patient Name: Gordon Fisher MRN: [id number] DOB:1961/08/12, 61 y.o., male Today's Date: 07/26/2022  PCP: none on file REFERRING PROVIDER: Fanny Dance, MD    OT End of Session - 07/26/22 1423     Visit Number 1    Number of Visits 17    Date for OT Re-Evaluation 09/24/22    Authorization Type [employer]    Authorization - Visit Number 1    Authorization - Number of Visits 20    OT Start Time 1152    OT Stop Time 1234    OT Time Calculation (min) 42 min    Activity Tolerance Patient tolerated treatment well    Behavior During Therapy WFL for tasks assessed/performed             Past Medical History:  Diagnosis Date   Stroke [facility])    Past Surgical History:  Procedure Laterality Date   BUBBLE STUDY  02/15/2022   Procedure: BUBBLE STUDY;  Surgeon: Christell Constant, MD;  Location: [facility] ENDOS[facility];  Service: Cardiovascular;;   IR CT HEAD LTD  02/11/2022   IR CT HEAD LTD  02/11/2022   IR CT HEAD LTD  02/11/2022   IR PERCUTANEOUS ART THROMBECTOMY/INFUSION INTRACRANIAL INC DIAG ANGIO  02/11/2022   IR PERCUTANEOUS ART THROMBECTOMY/INFUSION INTRACRANIAL INC DIAG ANGIO  02/11/2022   IR US GUIDE VASC ACCESS RIGHT  02/11/2022   IR US GUIDE VASC ACCESS RIGHT  02/11/2022   RADIOLOGY WITH ANESTHESIA N/A 02/11/2022   Procedure: IR WITH ANESTHESIA;  Surgeon: Radiologist, Medication, MD;  Location: [facility] OR;  Service: Radiology;  Laterality: N/A;   RADIOLOGY WITH ANESTHESIA N/A 02/11/2022   Procedure: RADIOLOGY WITH ANESTHESIA;  Surgeon: Radiologist, Medication, MD;  Location: [facility] OR;  Service: Radiology;  Laterality: N/A;   TEE WITHOUT CARDIOVERSION N/A 02/15/2022   Procedure: TRANSESOPHAGEAL ECHOCARDIOGRAM (TEE);  Surgeon: Christell Constant, MD;  Location: [facility] ENDOSCOPY;  Service: Cardiovascular;  Laterality: N/A;   Patient Active Problem List   Diagnosis Date Noted   Hypocalcemia 03/22/2022   DVT, lower extremity, distal  (HCC) 03/22/2022   Obesity 03/22/2022   Elevated LDL cholesterol level 03/22/2022   Acute ischemic left middle cerebral artery (MCA) stroke (HCC) 02/18/2022   Cerebrovascular accident (CVA) due to occlusion of left middle cerebral artery (HCC) 02/11/2022    ONSET DATE: referral 07/15/22 (CVA 02/11/22)  REFERRING DIAG: I63.9 (ICD-10-CM) - Cerebrovascular accident (CVA), unspecified mechanism (HCC) R13.10 (ICD-10-CM) - Dysphagia, unspecified type   THERAPY DIAG:  Hemiplegia and hemiparesis following cerebral infarction affecting right dominant side (HCC)  Muscle weakness (generalized)  Other lack of coordination  Unsteadiness on feet  Other abnormalities of gait and mobility  Other disturbances of skin sensation  Rationale for Evaluation and Treatment Rehabilitation  SUBJECTIVE:   SUBJECTIVE STATEMENT: Pt was admitted to [facility] on [facility]/11/2022 with right-sided weakness, right facial droop and slurred speech.  CT H showed left-MCA hyperdense sign and he reports sleep TNK prior to transfer to [facility].  CTA showed left-MCA M1 occlusion and he underwent cerebral angio with thrombectomy and T1C13 flow by Dr. Lonzo Candy de Rodrigez.  Follow-up MRI brain done revealing small areas of acute infarct in left-MCA territory with mild hemorrhagic transformation in left parietal lobe and left-MCA bifurcation residual vascular thrombus.  He was taken back for thrombectomy with stent for dissection but had intra stent occlusion which was unable to be recanalized.  Postop CT showed small SAH which was stable on follow-up films.Repeat MRI brain 04/16  showed extensive evolving acute ischemic left-MCA occlusion increasing in size with associated edema, few additional small acute cortical/subcortical infarcts involving contralateral right frontal lobe and left ACA distribution. 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   He requires CGA for ambulatory transfers with hemi-walker and Min-Mod A for self-care tasks.  Pt does have R shoulder subluxation and wears shoulder brace for support.   Pt accompanied by: family member (wife, [name])  PERTINENT HISTORY: in relatively good health prior to CVA  PRECAUTIONS: Fall  WEIGHT BEARING RESTRICTIONS No  PAIN:  Are you having pain? No  FALLS: Has patient fallen in last 6 months? Yes. Number of falls 1  LIVING ENVIRONMENT: Lives with: lives with their family Lives in: House/apartment Stairs: Yes: Internal: basement on lower level, but does not need to navigate steps and External: 5-6 steps Has following equipment at home: Hemi walker, Wheelchair (manual), Shower bench, bed side commode, Grab bars, and Ramped entry  PLOF: Independent, Independent with basic ADLs, and Independent with gait  PATIENT GOALS continue to make gains, independence with getting dressed  OBJECTIVE:   HAND DOMINANCE: Right  ADLs: Transfers/ambulation related to ADLs: Min A to CGA with hemi-walker, occasional use of Stedy for bathroom transfers Eating: wife cuts up meat and corn on the cob Grooming: Supervision/setup - improved ability to open and apply toothpaste UB Dressing: Min A with wife assisting to thread R arm LB Dressing: Mod A requires assist for threading RLE, but able to pull pants up/down over hips. Able to don socks and shoes, needing assist to tie shoes Toileting: Pt is recently able to complete hygiene, requires intermittent assist for thoroughness Bathing: Mod A Tub Shower transfers: Min A for ambulation to tub bench, Min A- Supervision for transfer Equipment: Transfer tub bench, Grab bars, and bed side commode   IADLs: Light housekeeping: folds laundry, wipes table/counters, takes dishes to sink Medication management: wife loads pillbox and he is able to feed himself pills in applesauce after wife sets up Handwriting: Not legible  MOBILITY STATUS: Needs Assist:  CGA with hemi-walker  POSTURE COMMENTS:  No Significant postural limitations Sitting balance: Moves/returns truncal midpoint >2 inches in all planes  ACTIVITY TOLERANCE: Activity tolerance: WFL for tasks assessed during eval  FUNCTIONAL OUTCOME MEASURES: FOTO: 11  UPPER EXTREMITY ROM    AROM: Pt with no active ROM during assessment.  Pt was able to elicit min shoulder elevation and slight gross grasp when cued and provided increased time to initiate movement.    Passive ROM Right eval Left eval  Shoulder flexion 100   Shoulder abduction    Shoulder adduction    Shoulder extension    Shoulder internal rotation WNL   Shoulder external rotation Onset of pain >5-10* from neutral   Middle trapezius    Lower trapezius    Elbow flexion WNL   Elbow extension WNL   Wrist flexion WNL   Wrist extension WNL   Wrist ulnar deviation    Wrist radial deviation    Wrist pronation    Wrist supination    (Blank rows = not tested)  HAND FUNCTION: No functional grasp.  Trace gross grasp with increased time for initiation  SENSATION: Difficult to assess due to expressive aphasia  MUSCLE TONE: RUE: Mild and Hypertonic  COGNITION: Overall cognitive status: Difficulty to assess due to: severity of deficits and pt with expressive aphasia impacting ability to engage in cognitive assessment.  VISION:  Subjective report: wears readers Baseline vision: Wears glasses for reading only   VISION ASSESSMENT: To be further assessed in functional context Pt with R inattention when in IP Rehab, to be further assessed   OBSERVATIONS: L lean with ambulating, requiring CGA for ambulation with hemi-walker and cues for attention to RLE during ambulation, especially when navigating doorways   TODAY'S TREATMENT:  Educated on functional incorporation of RUE with increased engagement of RUE into functional tasks such as stabilizing toothbrush when applying toothpaste and utilization of BUE (L over R)  when wiping down tables and counter tops.   PATIENT EDUCATION: Education details: Educated on role and purpose of OT as well as potential interventions and goals for therapy based on initial evaluation findings. Person educated: Patient and Spouse Education method: Explanation Education comprehension: verbalized understanding and needs further education   HOME EXERCISE PROGRAM: TBD    GOALS: Goals reviewed with patient? No  SHORT TERM GOALS: Target date: 08/27/22  Pt and spouse will be independent with PROM and self-ROM HEP for shoulder stability and ROM. Baseline: Goal status: INITIAL  2.  Pt will verbalize understanding of task modifications and/or potential A/E needs to increase ease, safety, and independence w/ ADLs Baseline:  Goal status: INITIAL  3.  Pt will verbalize understanding of strategies and use of RUE as stabilizer during ADLs. Baseline:  Goal status: INITIAL   LONG TERM GOALS: Target date: 09/24/22  Pt will demonstrate ability to complete UB dressing with supervision with use of hemi-dressing technique. Baseline:  Goal status: INITIAL  2.  Pt will demonstrate improved awareness of and positioning of RUE during mobility and self-care tasks. Baseline:  Goal status: INITIAL  3.  Pt will utilize RUE as gross assist during grooming tasks (such as washing hands and applying toothpaste). Baseline:  Goal status: INITIAL  4.  Pt will verbalize and or demonstrate increased functional use of BUE during LB dressing to complete at supervision level. Baseline:  Goal status: INITIAL  5.  Pt will verbalize improved functional incorporation of RUE with ADLs by reporting improved FOTO score to >/= to 20. Baseline: 11 Goal status: INITIAL    ASSESSMENT:  CLINICAL IMPRESSION: Patient is a 61 y.o. male who was seen today for occupational therapy evaluation for dominant RUE hemiparesis s/p CVA and impact on independence with ADLs. Pt currently lives with spouse  in a 2 story home with ability to live on main floor with bedroom and full bathroom, laundry in basement. Pt's spouse has been providing Min - Mod assist with ADLs and Min assist with ambulatory transfers with hemi-walker.  Pt has 1-2 inch subluxation in R dominant shoulder, wearing shoulder subluxation brace. Pt will benefit from skilled occupational therapy services to address strength and coordination, ROM, pain management, altered sensation, balance, GM/FM control, cognition, safety awareness, introduction of compensatory strategies/AE prn, visual-perception, and implementation of an HEP to improve participation and safety during ADLs and IADLs.   PERFORMANCE DEFICITS in functional skills including ADLs, IADLs, coordination, sensation, tone, ROM, strength, pain, flexibility, FMC, GMC, mobility, balance, body mechanics, vision, and UE functional use and psychosocial skills including environmental adaptation and routines and behaviors.   IMPAIRMENTS are limiting patient from ADLs and IADLs.   COMORBIDITIES may have co-morbidities  that affects occupational performance. Patient will benefit from skilled OT to address above impairments and improve overall function.  MODIFICATION OR ASSISTANCE TO COMPLETE EVALUATION: Min-Moderate modification of tasks or assist with assess necessary to complete an evaluation.  OT OCCUPATIONAL PROFILE AND  HISTORY: Detailed assessment: Review of records and additional review of physical, cognitive, psychosocial history related to current functional performance.  CLINICAL DECISION MAKING: Moderate - several treatment options, min-mod task modification necessary  REHAB POTENTIAL: Good  EVALUATION COMPLEXITY: Moderate    PLAN: OT FREQUENCY: 2x/week  OT DURATION: 8 weeks  PLANNED INTERVENTIONS: self care/ADL training, therapeutic exercise, therapeutic activity, neuromuscular re-education, manual therapy, passive range of motion, balance training, functional  mobility training, splinting, electrical stimulation, compression bandaging, moist heat, cryotherapy, patient/family education, visual/perceptual remediation/compensation, psychosocial skills training, energy conservation, coping strategies training, and DME and/or AE instructions  RECOMMENDED OTHER SERVICES: N/A  CONSULTED AND AGREED WITH PLAN OF CARE: Patient and family member/caregiver  PLAN FOR NEXT SESSION: Initiate PROM and self-ROM HEP, educate on use of RUE as stabilizer during ADLs/functional tasks.   Simonne Come, OTR/L 07/26/2022, 2:33 PM

## 2022-07-26 NOTE — Therapy (Signed)
OUTPATIENT PHYSICAL THERAPY NEURO EVALUATION   Patient Name: Gordon Fisher MRN: [id number] DOB:04-Feb-1961, 61 y.o., male Today's Date: 07/26/2022   PCP: None listed REFERRING PROVIDER: Jennye Boroughs, MD    PT End of Session - 07/26/22 1304     Visit Number 1    Number of Visits 16    Date for PT Re-Evaluation 09/20/22    Authorization Type [employer] [id number]    Authorization - Visit Number 1    Authorization - Number of Visits 20    Progress Note Due on Visit 10    PT Start Time 1315    PT Stop Time 1400    PT Time Calculation (min) 45 min    Equipment Utilized During Treatment Gait belt    Activity Tolerance Patient tolerated treatment well    Behavior During Therapy WFL for tasks assessed/performed             Past Medical History:  Diagnosis Date   Stroke [facility])    Past Surgical History:  Procedure Laterality Date   BUBBLE STUDY  02/15/2022   Procedure: BUBBLE STUDY;  Surgeon: Werner Lean, MD;  Location: [place];  Service: Cardiovascular;;   IR CT HEAD LTD  02/11/2022   IR CT HEAD LTD  02/11/2022   IR CT HEAD LTD  02/11/2022   IR PERCUTANEOUS ART THROMBECTOMY/INFUSION INTRACRANIAL INC DIAG ANGIO  02/11/2022   IR PERCUTANEOUS ART THROMBECTOMY/INFUSION INTRACRANIAL INC DIAG ANGIO  02/11/2022   IR US GUIDE VASC ACCESS RIGHT  02/11/2022   IR US GUIDE VASC ACCESS RIGHT  02/11/2022   RADIOLOGY WITH ANESTHESIA N/A 02/11/2022   Procedure: IR WITH ANESTHESIA;  Surgeon: Radiologist, Medication, MD;  Location: [place];  Service: Radiology;  Laterality: N/A;   RADIOLOGY WITH ANESTHESIA N/A 02/11/2022   Procedure: RADIOLOGY WITH ANESTHESIA;  Surgeon: Radiologist, Medication, MD;  Location: [place];  Service: Radiology;  Laterality: N/A;   TEE WITHOUT CARDIOVERSION N/A 02/15/2022   Procedure: TRANSESOPHAGEAL ECHOCARDIOGRAM (TEE);  Surgeon: Werner Lean, MD;  Location: [facility] ENDOSCOPY;  Service: Cardiovascular;  Laterality: N/A;   Patient Active Problem List    Diagnosis Date Noted   Hypocalcemia 03/22/2022   DVT, lower extremity, distal ([place]) 03/22/2022   Obesity 03/22/2022   Elevated LDL cholesterol level 03/22/2022   Acute ischemic left middle cerebral artery (MCA) stroke ([place]) 02/18/2022   Cerebrovascular accident (CVA) due to occlusion of left middle cerebral artery ([place]) 02/11/2022    ONSET DATE: April 2023  REFERRING DIAG: I63.9 (ICD-10-CM) - Cerebrovascular accident (CVA), unspecified mechanism ([place]) R13.10 (ICD-10-CM) - Dysphagia, unspecified type   THERAPY DIAG:  Difficulty in walking, not elsewhere classified  Muscle weakness (generalized)  Unsteadiness on feet  Rationale for Evaluation and Treatment Rehabilitation  SUBJECTIVE:  SUBJECTIVE STATEMENT: Had CVA in April 2023 and had been undergoing HHPT until last week and now presents to outpatient therapy for continued tx.   Pt accompanied by: significant other  PERTINENT HISTORY: 02/11/2022 patient developed right-sided weakness, aphasia, went to hospital for evaluation.  Code stroke was activated.  He received TNK and then transferred to [facility].  He underwent cerebral angiogram with thrombectomy.  Unfortunately vessel reoccluded later the day and the second thrombectomy was attempted.  Left M2 dissection was identified and treated with stent but unfortunately stent demonstrated reocclusion   PAIN:  Are you having pain? No  PRECAUTIONS: Shoulder and Fall, Aphasia   WEIGHT BEARING RESTRICTIONS No  FALLS: Has patient fallen in last 6 months? Yes. Number of falls 1, required 3-assist to recover  LIVING ENVIRONMENT: Lives with: lives with their spouse Lives in: House/apartment Stairs: Yes: Internal: flight steps; bilateral but cannot reach both and External: 4-5 steps; ramp is  also present.  Has following equipment at home: Hemi walker, Wheelchair (manual), and sit-stand lift  PLOF: Independent  PATIENT GOALS regain independence  OBJECTIVE:   DIAGNOSTIC FINDINGS: Follow-up MRI brain done revealing small areas of acute infarct in left-MCA territory with mild hemorrhagic transformation in left parietal lobe and left-MCA bifurcation residual vascular thrombus   COGNITION: Overall cognitive status:  Difficult to assess due to aphasia   SENSATION: Light touch: Impaired  Proprioception: Impaired   COORDINATION: RLE impaired, some deficits on left foot with alternating movements and isolated movements  EDEMA:  None   MUSCLE TONE: RLE: Modifed Ashworth Scale 2 = More marked increase in muscle tone through most of the ROM, but affected part(s) easily moved right quad most affected, no clonus right plantarflexors   MUSCLE LENGTH: Able to achieve full right knee extension to PROM  DTRs:  Patella 3+ = Brisk  POSTURE: No Significant postural limitations  LOWER EXTREMITY ROM:     LLE WNL, RLE AROM limited by weakness  LOWER EXTREMITY MMT:    MMT Right Eval Left Eval  Hip flexion 3- 5  Hip extension    Hip abduction 3- 4  Hip adduction 3- 5  Hip internal rotation    Hip external rotation    Knee flexion 3 5  Knee extension 2+ 5  Ankle dorsiflexion 0 5  Ankle plantarflexion 1 3+  Ankle inversion    Ankle eversion    (Blank rows = not tested)  BED MOBILITY:  NT  TRANSFERS: Assistive device utilized: Hemi walker  Sit to stand: Modified independence Stand to sit: SBA Chair to chair: SBA and CGA Floor:  Total assist  RAMP:  Level of Assistance: CGA Assistive device utilized: Hemi walker Ramp Comments:   CURB:  Level of Assistance: Min A Assistive device utilized: Education officer, environmental Comments: cues in sequence  STAIRS:  Level of Assistance: CGA and Min A  Stair Negotiation Technique: Step to Pattern with Single Rail on Left  Number  of Stairs: 10   Height of Stairs: 4-6"  Comments: cues and guarding  GAIT: Gait pattern: step to pattern, decreased ankle dorsiflexion- Right, and circumduction- Right Distance walked: 82 Assistive device utilized: Hemi walker Level of assistance: CGA Comments: deficits during turning  FUNCTIONAL TESTs:  5 times sit to stand: 11.59 sec with 3 retro LOB and poor eccentric control Timed up and go (TUG): 34.75 sec Berg Balance Scale: 32/56 Dynamic Gait Index: NT 2 minute walk test: 82 ft w/ hemi-walker, 0.68 ft/sec  M-CTSIB  Condition 1:  Firm Surface, EO 30 Sec, Normal and Mild Sway  Condition 2: Firm Surface, EC 30 Sec, Mild and Moderate Sway  Condition 3: Foam Surface, EO 30 Sec, Mild Sway  Condition 4: Foam Surface, EC 26 Sec, Moderate and Severe Sway     PATIENT SURVEYS:  FOTO NT  TODAY'S TREATMENT:  assessment   PATIENT EDUCATION: Education details: assessment findings, PT scope of practice Person educated: Patient and Spouse Education method: Explanation Education comprehension: verbalized understanding   HOME EXERCISE PROGRAM: TBD    GOALS: Goals reviewed with patient? Yes  SHORT TERM GOALS: Target date: 08/23/2022  Patient will perform HEP with family/caregiver supervision for improved strength, balance, transfers, and gait  Baseline: Goal status: INITIAL  2.  Ambulate 12 stairs with left HR and supervision to improve safety within home Baseline: CGA-min A Goal status: INITIAL  3.  Demonstrate improved safety and efficiency with ambulation per distance of 100 ft during Baseline: 82 ft w/ HW and CGA Goal status: INITIAL    LONG TERM GOALS: Target date: 09/20/2022  Demonstrate improved balance and reduced risk for falls per score 40/56 Berg Balance Test Baseline: 32/56 Goal status: INITIAL  2.  Demonstrate modified independent ambulation x 150 ft level surfaces using least restrictive AD in order to improve safety with home  environment Baseline: CGA w/ HW Goal status: INITIAL  3.  Manifest improved safety with gait per time of 18 sec TUG test using least restrictive AD Baseline: 34.75 sec w/ HW Goal status: INITIAL    ASSESSMENT:  CLINICAL IMPRESSION: Patient is a 61 y.o. male who was seen today for physical therapy evaluation and treatment for gait and balance deficits and activity limitations related to hx of neurological insult with resulting right side deficits. Exhibits significant RLE weakness, balance deficits with high risk for falls per assessment tests, gait deviations with significant compensation and abnormal mechanics.  PT services indicated to intervene and provide relevant interventions for patient and caregiver training to facilitate increased independence with functional mobility and reduced risk for falls    OBJECTIVE IMPAIRMENTS Abnormal gait, decreased activity tolerance, decreased balance, decreased coordination, decreased knowledge of use of DME, decreased mobility, difficulty walking, decreased strength, impaired sensation, impaired tone, impaired UE functional use, and improper body mechanics.   ACTIVITY LIMITATIONS carrying, lifting, bending, standing, squatting, stairs, transfers, bathing, toileting, dressing, reach over head, and locomotion level  PARTICIPATION LIMITATIONS: meal prep, cleaning, laundry, interpersonal relationship, driving, shopping, community activity, occupation, yard work, and leisure activities  PERSONAL FACTORS Time since onset of injury/illness/exacerbation are also affecting patient's functional outcome.   REHAB POTENTIAL: Excellent  CLINICAL DECISION MAKING: Evolving/moderate complexity  EVALUATION COMPLEXITY: Moderate  PLAN: PT FREQUENCY: 2x/week  PT DURATION: 8 weeks  PLANNED INTERVENTIONS: Therapeutic exercises, Therapeutic activity, Neuromuscular re-education, Balance training, Gait training, Patient/Family education, Self Care, Joint  mobilization, Stair training, Vestibular training, Canalith repositioning, Orthotic/Fit training, DME instructions, Aquatic Therapy, Dry Needling, Electrical stimulation, Spinal mobilization, Cryotherapy, Moist heat, Splintting, Taping, and Manual therapy  PLAN FOR NEXT SESSION: FOTO, AFO trials, stride-stance sit-stand for HEP   2:30 PM, 07/26/22 M. Shary Decamp, PT, DPT Physical Therapist- [place] Office Number: [phone]

## 2022-07-27 NOTE — Patient Instructions (Signed)
   You may want to vary your practice "schedule" and mix up sequence of names that you practice.  Use written words with the pictures of family members. I think seeing the words aids Gordon Fisher in saying the words.

## 2022-07-27 NOTE — Therapy (Signed)
OUTPATIENT SPEECH LANGUAGE PATHOLOGY APHASIA EVALUATION   Patient Name: Gordon Fisher MRN: [id number] DOB:[birth year], 61 y.o., male Today's Date: 07/27/2022  PCP: TBD REFERRING PROVIDER: Fanny Dance, MD    End of Session - 07/27/22 2157     Visit Number 1    Number of Visits 25    Date for SLP Re-Evaluation 10/24/22    Authorization - Number of Visits 20    SLP Start Time 1235    SLP Stop Time  1315    SLP Time Calculation (min) 40 min    Activity Tolerance Patient tolerated treatment well             Past Medical History:  Diagnosis Date   Stroke [facility])    Past Surgical History:  Procedure Laterality Date   BUBBLE STUDY  02/15/2022   Procedure: BUBBLE STUDY;  Surgeon: Christell Constant, MD;  Location: [facility] ENDOSCOPY;  Service: Cardiovascular;;   IR CT HEAD LTD  02/11/2022   IR CT HEAD LTD  02/11/2022   IR CT HEAD LTD  02/11/2022   IR PERCUTANEOUS ART THROMBECTOMY/INFUSION INTRACRANIAL INC DIAG ANGIO  02/11/2022   IR PERCUTANEOUS ART THROMBECTOMY/INFUSION INTRACRANIAL INC DIAG ANGIO  02/11/2022   IR US GUIDE VASC ACCESS RIGHT  02/11/2022   IR US GUIDE VASC ACCESS RIGHT  02/11/2022   RADIOLOGY WITH ANESTHESIA N/A 02/11/2022   Procedure: IR WITH ANESTHESIA;  Surgeon: Radiologist, Medication, MD;  Location: [facility] OR;  Service: Radiology;  Laterality: N/A;   RADIOLOGY WITH ANESTHESIA N/A 02/11/2022   Procedure: RADIOLOGY WITH ANESTHESIA;  Surgeon: Radiologist, Medication, MD;  Location: [facility] OR;  Service: Radiology;  Laterality: N/A;   TEE WITHOUT CARDIOVERSION N/A 02/15/2022   Procedure: TRANSESOPHAGEAL ECHOCARDIOGRAM (TEE);  Surgeon: Christell Constant, MD;  Location: [facility] ENDOSCOPY;  Service: Cardiovascular;  Laterality: N/A;   Patient Active Problem List   Diagnosis Date Noted   Hypocalcemia 03/22/2022   DVT, lower extremity, distal (HCC) 03/22/2022   Obesity 03/22/2022   Elevated LDL cholesterol level 03/22/2022   Acute ischemic left middle cerebral artery (MCA)  stroke (HCC) 02/18/2022   Cerebrovascular accident (CVA) due to occlusion of left middle cerebral artery (HCC) 02/11/2022    ONSET DATE: 02-11-22   REFERRING DIAG: I63.9 (ICD-10-CM) - Cerebrovascular accident (CVA), unspecified mechanism (HCC) R13.10 (ICD-10-CM) - Dysphagia, unspecified type   THERAPY DIAG:  Aphasia  Verbal apraxia  Rationale for Evaluation and Treatment Rehabilitation  SUBJECTIVE:   SUBJECTIVE STATEMENT: "Yeah." Pt accompanied by: significant other  PERTINENT HISTORY: Gordon Fisher is a 61 year old male in relatively good health who was admitted via [facility] on 02/11/22 with right sided weakness, right facial droop and slurred speech. CTH showed L-MCA hyperdense sign and he received TNK prior to transfer. CTA showed L-MCA MI occlusion and he underwent cerebral angio with thrombectomy and TICI 3 flow by Dr. Lonzo Candy Sherlon Handing.  Follow up MRI brain done revealing small areas of acute infarct infarct in L-MCA territory with mild hemorrhagic transformation in left parietal lobe and L-MCA bifurcation residual vascular thrombus. He was taken back for thrombectomy with stent for dissection but had intra-stent occlusion which was unable to be recanalized. Post op CT showed small SAH which was stable on follow up. He was started on ASA/Brillinta. MBS 04/18 showed oral dysphagia with oral delay and no penetration or aspiration. He was started on D3, thins with supervision. Repeat MRI brain on 04/16 showed extensive evolving acute ischemic L-MCA occlusion increased in size with associated edema, few additional  small acute cortical subcortical infarcts involving contralateral right frontal lobe and left ACA distribution. Stroke felt to be due to unknown etiology and wife elected on Zio patch on discharge to rule out A fib. 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 [name] was d/c'd from  [facility] on 03-19-22, and has had HHST since that time until last week.   PAIN:  Are you having pain?  Indicates no pain  FALLS: Has patient fallen in last 6 months?  Yes, Number of falls: 1. See PT eval for details.   LIVING ENVIRONMENT: Lives with: lives with their spouse Lives in: House/apartment  PLOF:  Level of assistance: Independent with ADLs Employment: Full-time employment   PATIENT GOALS Pt indicated he would like talking to improve  OBJECTIVE:   DIAGNOSTIC FINDINGS: MRI without contrast 02/13/22 IMPRESSION: 1. Extensive evolving acute ischemic left MCA distribution infarct, increased in size as compared to previous MRI from 02/11/2022. Associated edema without significant midline shift. 2. Small volume acute subarachnoid hemorrhage at the posterior aspect of the left Sylvian fissure, stable. 3. Few additional small volume acute ischemic cortical and subcortical infarcts involving the contralateral right frontal lobe and left ACA distribution as above. 4. Underlying chronic microvascular ischemic disease.  COGNITION: Overall cognitive status: Difficulty to assess due to: expressive and receptive aphasia, however attention to task and awareness of speech errors appear appropriate.  AUDITORY COMPREHENSION: Overall auditory comprehension: Impaired: simple, moderately complex, and complex YES/NO questions: Impaired: moderately complex and complex Following directions: Impaired: moderately complex and complex Conversation: Simple mod-maximally impaired, mod complex and complex maximally impaired unless max A provided Effective technique: extra processing time, pausing, repetition/stressing words, slowed speech, written cues, stressing words, and visual/gestural cues Comments: SLP asked pt what month the CVA was and handed him the calendar. He turned to April and pointed to 02-11-22 (correct date). Pt did not fully understand children/grandchildren dichotomy as he answered SLP  question for number of GRANDkids rather than KIDS. When SLP wrote out cues "kids" and "grandkids" and crossed out grandkids pt demonstrated understanding and indicated something that was unclear, gesturally. SLP wrote numbers 0-2 and pt pointed out the number of children he had with another request by SLP.   READING COMPREHENSION: Impaired: phrase, sentence, and paragraph  EXPRESSION: verbal, nonverbal- gestures, and augmentative device may be recommended/targeted in therapy  VERBAL EXPRESSION: Level of generative/spontaneous verbalization: word is maximally impaired. Pt's verbal expression at this time consists of inconsistent yes/no and family names with max A Automatic speech: name: intact, social response: intact, and counting: impaired  Repetition: Impaired: Word Naming: Confrontation: 0-25% Pragmatics: Impaired: dysprosody, interpretation of nonverbal communication, and verbal apraxia makes dysprosody evident as pt gropes for correct articulation Comments: SLP will likely pursue/recommend a speech generating device at some time during the therapy course. Verbal responses were perseverative in naming tasks.  Interfering components:  verbal apraxia Effective technique: phonemic cues, written cues, and articulatory cues - SLP provided first phoneme of pt's children's names - 1/2 successful for name approximation. When SLP provided first phoneme of grandchildren's names this was successful 1/3 for name approximation.  Non-verbal means of communication: gestures and communication board have been targeted previously however SLP questions the feasibility of these means long term. [name] states pt's gestures are difficult to understand many times.   WRITTEN EXPRESSION: Dominant hand: left due to right UE paresis/paralysis  Written expression: Impaired: word  MOTOR SPEECH: Overall motor speech: impaired  Level of impairment: Word, Phrase, Sentence, and Conversation Respiration: thoracic  breathing Phonation: normal Resonance: WFL Articulation: Impaired: word Intelligibility: Intelligible Motor planning: Impaired: aware and groping for words Motor speech errors: aware and groping for words Interfering components:  level of expressive aphasia   ORAL MOTOR EXAMINATION Overall status: Impaired:   Labial: Right (Symmetry and Strength) Lingual: Right (Symmetry and Strength) Comments: difficult to assess strength due to decr'd auditory comprehension vs oral non-verbal apraxia  STANDARDIZED ASSESSMENTS: QAB: Severe   PATIENT REPORTED OUTCOME MEASURES (PROM): Communication Participation Item Bank: will be providedb/,  in the first 2-3 weeks of therapy   TODAY'S TREATMENT:  SLP reviewed the results of the evaluation and requested pt's wife [name] put written names by pt's photos he has been practicing with.   PATIENT EDUCATION: Education details: home tasks Person educated: Patient and Spouse Education method: Customer service manager Education comprehension: verbalized understanding and needs further education   GOALS: Goals reviewed with patient? Yes, in general  SHORT TERM GOALS: Target date: 08/24/2022   Pt will name 6 salient family names when given picture and written name with consistent mod-max cues in 3 sessions (precursor for speech generating device) Baseline: Goal status: INITIAL  2.  Pt will produce simple phoneme/ bilabial+/a/  /a/, /ba/, /ma/, and/or /pa/ simultaneously with SLP 65% success (equal # reps/stimulus) in 2 sessions Baseline:  Goal status: INITIAL  3.  Pt will demo understanding of simple commands in context 80% over three sessions Baseline:  Goal status: INITIAL  4.  Pt will match word f:5 given a picture 80% of the time in 3 sessions (precursor for speech generating device) Baseline:  Goal status: INITIAL  5.  Pt will copy written family names with 50% success in two sessions  Baseline:  Goal Status: INITIAL   LONG  TERM GOALS: Target date: 10/24/2022  (or 20th visit)  Pt will name 6 salient family names when given picture and written name with consistent mod cues in 3 sessions (precursor for speech generating device) Baseline:  Goal status: INITIAL  2.  Pt will IMITATE simple phoneme/bilabial+/a/  /a/, /ba/, /ma/, and/or /pa/  65% success (equal # reps/stimulus) in 2 sessions Baseline:  Goal status: INITIAL  3.  Pt will generate meaningful message understood by [name] using speech generating device 60% of the time in 3 sessions Baseline:  Goal status: INITIAL  4.  [name] will indicate that spontaneous communication is easier than during week of evaluation Baseline:  Goal status: INITIAL   ASSESSMENT:  CLINICAL IMPRESSION: Patient is a 61 y.o. male who was seen today for assessment of aphasia. 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   OBJECTIVE IMPAIRMENTS include expressive language, receptive language, and aphasia. These impairments are limiting patient from return to work, managing medications, managing appointments, household responsibilities, ADLs/IADLs, and effectively communicating at home and in community. Factors affecting potential to achieve goals and functional outcome are ability to learn/carryover information and severity of impairments. Patient will benefit from skilled SLP services to address above impairments and improve overall function.  REHAB POTENTIAL: Good  PLAN: SLP FREQUENCY: 2x/week  SLP DURATION: 12 weeks  PLANNED INTERVENTIONS: Language facilitation, Environmental controls, Cueing hierachy, Internal/external aids, Functional tasks, and Multimodal communication approach    Sherisa Gilvin, CCC-SLP 07/27/2022, 10:03 PM

## 2022-08-04 ENCOUNTER — Ambulatory Visit: Payer: 59

## 2022-08-04 ENCOUNTER — Ambulatory Visit: Payer: 59 | Attending: Physical Medicine & Rehabilitation | Admitting: Occupational Therapy

## 2022-08-04 DIAGNOSIS — I69351 Hemiplegia and hemiparesis following cerebral infarction affecting right dominant side: Secondary | ICD-10-CM | POA: Insufficient documentation

## 2022-08-04 DIAGNOSIS — R208 Other disturbances of skin sensation: Secondary | ICD-10-CM | POA: Insufficient documentation

## 2022-08-04 DIAGNOSIS — M6281 Muscle weakness (generalized): Secondary | ICD-10-CM

## 2022-08-04 DIAGNOSIS — R2689 Other abnormalities of gait and mobility: Secondary | ICD-10-CM

## 2022-08-04 DIAGNOSIS — R482 Apraxia: Secondary | ICD-10-CM | POA: Insufficient documentation

## 2022-08-04 DIAGNOSIS — R2681 Unsteadiness on feet: Secondary | ICD-10-CM | POA: Insufficient documentation

## 2022-08-04 DIAGNOSIS — R4701 Aphasia: Secondary | ICD-10-CM | POA: Diagnosis present

## 2022-08-04 DIAGNOSIS — R262 Difficulty in walking, not elsewhere classified: Secondary | ICD-10-CM

## 2022-08-04 DIAGNOSIS — R278 Other lack of coordination: Secondary | ICD-10-CM | POA: Insufficient documentation

## 2022-08-04 NOTE — Therapy (Addendum)
OUTPATIENT OCCUPATIONAL THERAPY NEURO EVALUATION  Patient Name: Gordon Fisher MRN: [id number] DOB:[birth year], 61 y.o., male Today's Date: 08/04/2022  PCP: none on file REFERRING PROVIDER: Fanny Dance, MD    OT End of Session - 08/04/22 1120     Visit Number 2    Number of Visits 17    Date for OT Re-Evaluation 09/24/22    Authorization Type [employer]    Authorization - Visit Number 2    Authorization - Number of Visits 20    OT Start Time 1020    OT Stop Time 1105    OT Time Calculation (min) 45 min    Activity Tolerance Patient tolerated treatment well    Behavior During Therapy WFL for tasks assessed/performed              Past Medical History:  Diagnosis Date   Stroke [facility])    Past Surgical History:  Procedure Laterality Date   BUBBLE STUDY  02/15/2022   Procedure: BUBBLE STUDY;  Surgeon: Christell Constant, MD;  Location: [facility] ENDOSCOPY;  Service: Cardiovascular;;   IR CT HEAD LTD  02/11/2022   IR CT HEAD LTD  02/11/2022   IR CT HEAD LTD  02/11/2022   IR PERCUTANEOUS ART THROMBECTOMY/INFUSION INTRACRANIAL INC DIAG ANGIO  02/11/2022   IR PERCUTANEOUS ART THROMBECTOMY/INFUSION INTRACRANIAL INC DIAG ANGIO  02/11/2022   IR US GUIDE VASC ACCESS RIGHT  02/11/2022   IR US GUIDE VASC ACCESS RIGHT  02/11/2022   RADIOLOGY WITH ANESTHESIA N/A 02/11/2022   Procedure: IR WITH ANESTHESIA;  Surgeon: Radiologist, Medication, MD;  Location: [facility] OR;  Service: Radiology;  Laterality: N/A;   RADIOLOGY WITH ANESTHESIA N/A 02/11/2022   Procedure: RADIOLOGY WITH ANESTHESIA;  Surgeon: Radiologist, Medication, MD;  Location: [facility] OR;  Service: Radiology;  Laterality: N/A;   TEE WITHOUT CARDIOVERSION N/A 02/15/2022   Procedure: TRANSESOPHAGEAL ECHOCARDIOGRAM (TEE);  Surgeon: Christell Constant, MD;  Location: [facility] ENDOSCOPY;  Service: Cardiovascular;  Laterality: N/A;   Patient Active Problem List   Diagnosis Date Noted   Hypocalcemia 03/22/2022   DVT, lower extremity, distal  (HCC) 03/22/2022   Obesity 03/22/2022   Elevated LDL cholesterol level 03/22/2022   Acute ischemic left middle cerebral artery (MCA) stroke (HCC) 02/18/2022   Cerebrovascular accident (CVA) due to occlusion of left middle cerebral artery (HCC) 02/11/2022    ONSET DATE: referral 07/15/22 (CVA 02/11/22)  REFERRING DIAG: I63.9 (ICD-10-CM) - Cerebrovascular accident (CVA), unspecified mechanism (HCC) R13.10 (ICD-10-CM) - Dysphagia, unspecified type   THERAPY DIAG:  Hemiplegia and hemiparesis following cerebral infarction affecting right dominant side (HCC)  Other lack of coordination  Muscle weakness (generalized)  Other abnormalities of gait and mobility  Other disturbances of skin sensation  Unsteadiness on feet  Rationale for Evaluation and Treatment Rehabilitation  SUBJECTIVE:   SUBJECTIVE STATEMENT: Pt's spouse reports increased incorporation of RUE into ADL tasks this week. Pt accompanied by: family member (wife, [name])  PERTINENT HISTORY: in relatively good health prior to CVA  PRECAUTIONS: Fall  WEIGHT BEARING RESTRICTIONS No  PAIN:  Are you having pain? No  FALLS: Has patient fallen in last 6 months? Yes. Number of falls 1  PLOF: Independent, Independent with basic ADLs, and Independent with gait  PATIENT GOALS continue to make gains, independence with getting dressed  OBJECTIVE:   HAND DOMINANCE: Right  FUNCTIONAL OUTCOME MEASURES: FOTO: 11  UPPER EXTREMITY ROM    AROM: Pt with no active ROM during assessment.  Pt was able to elicit min shoulder elevation  and slight gross grasp when cued and provided increased time to initiate movement.    Passive ROM Right eval Left eval  Shoulder flexion 100   Shoulder abduction    Shoulder adduction    Shoulder extension    Shoulder internal rotation WNL   Shoulder external rotation Onset of pain >5-10* from neutral   Middle trapezius    Lower trapezius    Elbow flexion WNL   Elbow extension WNL   Wrist  flexion WNL   Wrist extension WNL   Wrist ulnar deviation    Wrist radial deviation    Wrist pronation    Wrist supination    (Blank rows = not tested)  HAND FUNCTION: No functional grasp.  Trace gross grasp with increased time for initiation  SENSATION: Difficult to assess due to expressive aphasia  MUSCLE TONE: RUE: Mild and Hypertonic  ---------------------------------------------------------------------------------------------------------------------------------------------------------------------- (objective measures above completed at initial evaluation unless otherwise dated)  TODAY'S TREATMENT: 08/04/22 Supine AAROM: OT facilitating shoulder abduction/adduction and internal rotation in supine.  OT providing facilitation for improved positioning and intermittent tactile cues to facilitate external rotation.  Pt able to demonstrate adduction from 90* to near neutral and abduction to ~30* with therapist support under elbow at at wrist for improved alignment.  OT facilitating elbow flexion/extension in supine at neutral with pt able to complete elbow extension to neutral and elbow extension and flexion when positioned with shoulder at 90* flexion to minimize effects of gravity on elbow flexion.  Pt able to extend against gravity in this position.   Provided hand over hand facilitation for wife and demonstration of hand placement and facilitation during activity.  Provided with written handout of instructions for hand placement and exercise technique.   PATIENT EDUCATION: Education details: Educated on AAROM for shoulder and elbow in supine. Person educated: Patient and Spouse Education method: Explanation, Demonstration, Tactile cues, Verbal cues, and Handouts Education comprehension: verbalized understanding and needs further education   HOME EXERCISE PROGRAM: Written exercises of shoulder and elbow in supine with cues for hand placement and facilitation.    GOALS: Goals  reviewed with patient? Yes  SHORT TERM GOALS: Target date: 08/27/22  Pt and spouse will be independent with PROM and self-ROM HEP for shoulder stability and ROM. Baseline: Goal status: IN PROGRESS  2.  Pt will verbalize understanding of task modifications and/or potential A/E needs to increase ease, safety, and independence w/ ADLs Baseline:  Goal status: IN PROGRESS  3.  Pt will verbalize understanding of strategies and use of RUE as stabilizer during ADLs. Baseline:  Goal status: IN PROGRESS   LONG TERM GOALS: Target date: 09/24/22  Pt will demonstrate ability to complete UB dressing with supervision with use of hemi-dressing technique. Baseline:  Goal status: IN PROGRESS  2.  Pt will demonstrate improved awareness of and positioning of RUE during mobility and self-care tasks. Baseline:  Goal status: IN PROGRESS  3.  Pt will utilize RUE as gross assist during grooming tasks (such as washing hands and applying toothpaste). Baseline:  Goal status: IN PROGRESS  4.  Pt will verbalize and or demonstrate increased functional use of BUE during LB dressing to complete at supervision level. Baseline:  Goal status: IN PROGRESS  5.  Pt will verbalize improved functional incorporation of RUE with ADLs by reporting improved FOTO score to >/= to 20. Baseline: 11 Goal status: IN PROGRESS    ASSESSMENT:  CLINICAL IMPRESSION: Pt seen for initial treatment session s/p evaluation.  OT reiterated plan of  care and discussed established goals, with pt and spouse in agreement.  Pt able to demonstrate elbow flexion in gravity minimized position, elbow extension against gravity in supine, and shoulder abduction/adduction with support for positioning.  Pt and spouse pleased with movement this session.  OT providing cues for breathing as pt initially holding breath and activating whole body to initiate movement.  PERFORMANCE DEFICITS in functional skills including ADLs, IADLs, coordination,  sensation, tone, ROM, strength, pain, flexibility, FMC, GMC, mobility, balance, body mechanics, vision, and UE functional use and psychosocial skills including environmental adaptation and routines and behaviors.   IMPAIRMENTS are limiting patient from ADLs and IADLs.   COMORBIDITIES may have co-morbidities  that affects occupational performance. Patient will benefit from skilled OT to address above impairments and improve overall function.  MODIFICATION OR ASSISTANCE TO COMPLETE EVALUATION: Min-Moderate modification of tasks or assist with assess necessary to complete an evaluation.  OT OCCUPATIONAL PROFILE AND HISTORY: Detailed assessment: Review of records and additional review of physical, cognitive, psychosocial history related to current functional performance.  CLINICAL DECISION MAKING: Moderate - several treatment options, min-mod task modification necessary  REHAB POTENTIAL: Good  EVALUATION COMPLEXITY: Moderate    PLAN: OT FREQUENCY: 2x/week  OT DURATION: 8 weeks  PLANNED INTERVENTIONS: self care/ADL training, therapeutic exercise, therapeutic activity, neuromuscular re-education, manual therapy, passive range of motion, balance training, functional mobility training, splinting, electrical stimulation, compression bandaging, moist heat, cryotherapy, patient/family education, visual/perceptual remediation/compensation, psychosocial skills training, energy conservation, coping strategies training, and DME and/or AE instructions  RECOMMENDED OTHER SERVICES: N/A  CONSULTED AND AGREED WITH PLAN OF CARE: Patient and family member/caregiver  PLAN FOR NEXT SESSION: Review PROM and initiate self-ROM HEP, educate on use of RUE as stabilizer during ADLs/functional tasks.   Simonne Come, OTR/L 08/04/2022, 11:21 AM

## 2022-08-04 NOTE — Therapy (Signed)
OUTPATIENT PHYSICAL THERAPY NEURO TREATMENT   Patient Name: Gordon Fisher MRN: [id number] DOB:[birth year], 61 y.o., male Today's Date: 08/04/2022   PCP: None listed REFERRING PROVIDER: Fanny Dance, MD    PT End of Session - 08/04/22 0923     Visit Number 2    Number of Visits 16    Date for PT Re-Evaluation 09/20/22    Authorization Type [employer] [id number]    Authorization - Visit Number 2    Authorization - Number of Visits 20    Progress Note Due on Visit 10    PT Start Time 0930    PT Stop Time 1015    PT Time Calculation (min) 45 min    Equipment Utilized During Treatment Gait belt    Activity Tolerance Patient tolerated treatment well    Behavior During Therapy WFL for tasks assessed/performed             Past Medical History:  Diagnosis Date   Stroke [facility])    Past Surgical History:  Procedure Laterality Date   BUBBLE STUDY  02/15/2022   Procedure: BUBBLE STUDY;  Surgeon: Christell Constant, MD;  [facility]: [facility];  Service: Cardiovascular;;   IR CT HEAD LTD  02/11/2022   IR CT HEAD LTD  02/11/2022   IR CT HEAD LTD  02/11/2022   IR PERCUTANEOUS ART THROMBECTOMY/INFUSION INTRACRANIAL INC DIAG ANGIO  02/11/2022   IR PERCUTANEOUS ART THROMBECTOMY/INFUSION INTRACRANIAL INC DIAG ANGIO  02/11/2022   IR US GUIDE VASC ACCESS RIGHT  02/11/2022   IR US GUIDE VASC ACCESS RIGHT  02/11/2022   RADIOLOGY WITH ANESTHESIA N/A 02/11/2022   Procedure: IR WITH ANESTHESIA;  Surgeon: Radiologist, Medication, MD;  Location: [facility] OR;  Service: Radiology;  Laterality: N/A;   RADIOLOGY WITH ANESTHESIA N/A 02/11/2022   Procedure: RADIOLOGY WITH ANESTHESIA;  Surgeon: Radiologist, Medication, MD;  Location: [facility] OR;  Service: Radiology;  Laterality: N/A;   TEE WITHOUT CARDIOVERSION N/A 02/15/2022   Procedure: TRANSESOPHAGEAL ECHOCARDIOGRAM (TEE);  Surgeon: Christell Constant, MD;  Location: [facility] ENDOSCOPY;  Service: Cardiovascular;  Laterality: N/A;   Patient Active Problem List    Diagnosis Date Noted   Hypocalcemia 03/22/2022   DVT, lower extremity, distal (HCC) 03/22/2022   Obesity 03/22/2022   Elevated LDL cholesterol level 03/22/2022   Acute ischemic left middle cerebral artery (MCA) stroke (HCC) 02/18/2022   Cerebrovascular accident (CVA) due to occlusion of left middle cerebral artery (HCC) 02/11/2022    ONSET DATE: April 2023  REFERRING DIAG: I63.9 (ICD-10-CM) - Cerebrovascular accident (CVA), unspecified mechanism (HCC) R13.10 (ICD-10-CM) - Dysphagia, unspecified type   THERAPY DIAG:  Difficulty in walking, not elsewhere classified  Muscle weakness (generalized)  Unsteadiness on feet  Other abnormalities of gait and mobility  Rationale for Evaluation and Treatment Rehabilitation  SUBJECTIVE:  SUBJECTIVE STATEMENT: Been working at home with activity/walking  Pt accompanied by: significant other  PERTINENT HISTORY: 02/11/2022 patient developed right-sided weakness, aphasia, went to hospital for evaluation.  Code stroke was activated.  He received TNK and then transferred to [facility].  He underwent cerebral angiogram with thrombectomy.  Unfortunately vessel reoccluded later the day and the second thrombectomy was attempted.  Left M2 dissection was identified and treated with stent but unfortunately stent demonstrated reocclusion   PAIN:  Are you having pain? No  PRECAUTIONS: Shoulder and Fall, Aphasia   WEIGHT BEARING RESTRICTIONS No  FALLS: Has patient fallen in last 6 months? Yes. Number of falls 1, required 3-assist to recover  LIVING ENVIRONMENT: Lives with: lives with their spouse Lives in: House/apartment Stairs: Yes: Internal: flight steps; bilateral but cannot reach both and External: 4-5 steps; ramp is also present.  Has following  equipment at home: Hemi walker, Wheelchair (manual), and sit-stand lift  PLOF: Independent  PATIENT GOALS regain independence  OBJECTIVE:   TODAY'S TREATMENT: 08/04/22 Activity Comments  FOTO 47%  Gait training, stair training, curb training -trials with PLS AFO -trials with ground-reaction  Lateral step ups w/ RLE 2x10 4" box, therapist facilitating right knee/hip extension  Forward step downs LLE 2x10 4" box  LE there ex 3x10 RLE 3# -standing hip flexion -seated LAQ  Standing w/ LLE on step, no UE support. Therapist facilittaes RLE extension stability 3x30 sec 3x10 sec eyes closed     DIAGNOSTIC FINDINGS: Follow-up MRI brain done revealing small areas of acute infarct in left-MCA territory with mild hemorrhagic transformation in left parietal lobe and left-MCA bifurcation residual vascular thrombus   COGNITION: Overall cognitive status:  Difficult to assess due to aphasia   SENSATION: Light touch: Impaired  Proprioception: Impaired   COORDINATION: RLE impaired, some deficits on left foot with alternating movements and isolated movements  EDEMA:  None   MUSCLE TONE: RLE: Modifed Ashworth Scale 2 = More marked increase in muscle tone through most of the ROM, but affected part(s) easily moved right quad most affected, no clonus right plantarflexors   MUSCLE LENGTH: Able to achieve full right knee extension to PROM  DTRs:  Patella 3+ = Brisk  POSTURE: No Significant postural limitations  LOWER EXTREMITY ROM:     LLE WNL, RLE AROM limited by weakness  LOWER EXTREMITY MMT:    MMT Right Eval Left Eval  Hip flexion 3- 5  Hip extension    Hip abduction 3- 4  Hip adduction 3- 5  Hip internal rotation    Hip external rotation    Knee flexion 3 5  Knee extension 2+ 5  Ankle dorsiflexion 0 5  Ankle plantarflexion 1 3+  Ankle inversion    Ankle eversion    (Blank rows = not tested)  BED MOBILITY:  NT  TRANSFERS: Assistive device utilized: Hemi walker  Sit  to stand: Modified independence Stand to sit: SBA Chair to chair: SBA and CGA Floor:  Total assist  RAMP:  Level of Assistance: CGA Assistive device utilized: Hemi walker Ramp Comments:   CURB:  Level of Assistance: Min A Assistive device utilized: Education officer, environmental Comments: cues in sequence  STAIRS:  Level of Assistance: CGA and Min A  Stair Negotiation Technique: Step to Pattern with Single Rail on Left  Number of Stairs: 10   Height of Stairs: 4-6"  Comments: cues and guarding  GAIT: Gait pattern: step to pattern, decreased ankle dorsiflexion- Right, and circumduction- Right Distance walked:  82 Assistive device utilized: Hemi walker Level of assistance: CGA Comments: deficits during turning  FUNCTIONAL TESTs:  5 times sit to stand: 11.59 sec with 3 retro LOB and poor eccentric control Timed up and go (TUG): 34.75 sec Berg Balance Scale: 32/56 Dynamic Gait Index: NT 2 minute walk test: 82 ft w/ hemi-walker, 0.68 ft/sec  M-CTSIB  Condition 1: Firm Surface, EO 30 Sec, Normal and Mild Sway  Condition 2: Firm Surface, EC 30 Sec, Mild and Moderate Sway  Condition 3: Foam Surface, EO 30 Sec, Mild Sway  Condition 4: Foam Surface, EC 26 Sec, Moderate and Severe Sway     PATIENT SURVEYS:  FOTO 47%  TODAY'S TREATMENT:  assessment   PATIENT EDUCATION: Education details: assessment findings, PT scope of practice Person educated: Patient and Spouse Education method: Explanation Education comprehension: verbalized understanding   HOME EXERCISE PROGRAM: TBD    GOALS: Goals reviewed with patient? Yes  SHORT TERM GOALS: Target date: 08/23/2022  Patient will perform HEP with family/caregiver supervision for improved strength, balance, transfers, and gait  Baseline: Goal status: INITIAL  2.  Ambulate 12 stairs with left HR and supervision to improve safety within home Baseline: CGA-min A Goal status: INITIAL  3.  Demonstrate improved safety and efficiency  with ambulation per distance of 100 ft during Baseline: 82 ft w/ HW and CGA Goal status: INITIAL    LONG TERM GOALS: Target date: 09/20/2022  Demonstrate improved balance and reduced risk for falls per score 40/56 Berg Balance Test Baseline: 32/56 Goal status: INITIAL  2.  Demonstrate modified independent ambulation x 150 ft level surfaces using least restrictive AD in order to improve safety with home environment Baseline: CGA w/ HW Goal status: INITIAL  3.  Manifest improved safety with gait per time of 18 sec TUG test using least restrictive AD Baseline: 34.75 sec w/ HW Goal status: INITIAL    ASSESSMENT:  CLINICAL IMPRESSION: Decreased RLE circumduction and improved foot clearance with use of AFO on right foot with PLS AFO demonstrating improved mechanics vs ground-reaction which encouraged extension thrust in stance phase.  Demonstrates improved RLE stability overall with use of device vs without.  Right ankle demo 0/5 strength and difficulty with open chain volitional knee extension but improved recruitment in standing/weight bearing position.  Therapist utilized various facilitation techniques for RLE engagement throughout for stance phase control. FOTO score of 47%      OBJECTIVE IMPAIRMENTS Abnormal gait, decreased activity tolerance, decreased balance, decreased coordination, decreased knowledge of use of DME, decreased mobility, difficulty walking, decreased strength, impaired sensation, impaired tone, impaired UE functional use, and improper body mechanics.   ACTIVITY LIMITATIONS carrying, lifting, bending, standing, squatting, stairs, transfers, bathing, toileting, dressing, reach over head, and locomotion level  PARTICIPATION LIMITATIONS: meal prep, cleaning, laundry, interpersonal relationship, driving, shopping, community activity, occupation, yard work, and leisure activities  PERSONAL FACTORS Time since onset of injury/illness/exacerbation are also affecting  patient's functional outcome.   REHAB POTENTIAL: Excellent  CLINICAL DECISION MAKING: Evolving/moderate complexity  EVALUATION COMPLEXITY: Moderate  PLAN: PT FREQUENCY: 2x/week  PT DURATION: 8 weeks  PLANNED INTERVENTIONS: Therapeutic exercises, Therapeutic activity, Neuromuscular re-education, Balance training, Gait training, Patient/Family education, Self Care, Joint mobilization, Stair training, Vestibular training, Canalith repositioning, Orthotic/Fit training, DME instructions, Aquatic Therapy, Dry Needling, Electrical stimulation, Spinal mobilization, Cryotherapy, Moist heat, Splintting, Taping, and Manual therapy  PLAN FOR NEXT SESSION: , AFO trials, stride-stance sit-stand for HEP   9:25 AM, 08/04/22 M. Shary Decamp, PT, DPT Physical Therapist- [place] Office  Number: [phone]

## 2022-08-06 ENCOUNTER — Encounter: Payer: 59 | Attending: Physical Medicine & Rehabilitation | Admitting: Physical Medicine & Rehabilitation

## 2022-08-06 ENCOUNTER — Encounter: Payer: Self-pay | Admitting: Physical Medicine & Rehabilitation

## 2022-08-06 VITALS — BP 119/80 | HR 83 | Ht 68.0 in | Wt 184.4 lb

## 2022-08-06 DIAGNOSIS — M21371 Foot drop, right foot: Secondary | ICD-10-CM | POA: Diagnosis present

## 2022-08-06 DIAGNOSIS — I63512 Cerebral infarction due to unspecified occlusion or stenosis of left middle cerebral artery: Secondary | ICD-10-CM | POA: Diagnosis present

## 2022-08-06 DIAGNOSIS — M25511 Pain in right shoulder: Secondary | ICD-10-CM | POA: Insufficient documentation

## 2022-08-06 NOTE — Progress Notes (Signed)
Subjective:    Patient ID: Gordon Fisher, male    DOB: 07/07/1961, 61 y.o.   MRN: [id number]  HPI HPI   Brief HPI:   Gordon Fisher is a 61 y.o. male in relatively good health was admitted via [facility] on 02/11/2022 with right-sided weakness, right facial droop and slurred speech.  CT H showed left-MCA hyperdense sign and he reports sleep TNK prior to transfer to [facility].  CTA showed left-MCA M1 occlusion and he underwent cerebral angio with thrombectomy and T1C13 flow by Dr. Nelida Gores de Rodrigez.  Follow-up MRI brain done revealing small areas of acute infarct in left-MCA territory with mild hemorrhagic transformation in left parietal lobe and left-MCA bifurcation residual vascular thrombus.  He was taken back for thrombectomy with stent for dissection but had intra stent occlusion which was unable to be recanalized.  Postop CT showed small SAH which was stable on follow-up films.   He was started on ASA/Brilinta as well as tube feeds for nutritional support until MBS of 04/18 when he was advanced to D3 diet with supervision.  Repeat MRI brain 04/16 showed extensive evolving acute ischemic left-MCA occlusion increasing in size with associated edema, few additional small acute cortical/subcortical infarcts involving contralateral right frontal lobe and left ACA distribution.  Stroke felt to be due to unknown etiology and wife elected on Zio patch which was placed prior to discharge to rule out A-fib.  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  CIR was recommended due to functional decline.      Hospital Course: Gordon Fisher was admitted to rehab 02/18/2022 for inpatient therapies to consist of PT, ST and OT at least three hours five days a week. Past admission physiatrist, therapy team and rehab RN have worked together to provide customized collaborative inpatient rehab.  His blood pressures were  monitored on TID basis and has been controlled without medications. He was noted to have hypocalcemia with ionized calcium at 1.11 therefore Tums was added for calcium supplementation twice daily.  Check of labs at admission showed AKI which has resolved with serial checks.   He was noted to be febrile on 05/05 and UA done was positive therefore Rocephin was added for treatment of Enterobacter UTI.[name]  CXR was NAD. BLE Dopplers also ordered for work-up and showed acute right posterior tib/gastroc DVT.  This was monitored with repeat Dopplers 5/10 which showed persistent DVT and he was cleared to start Eliquis per discussion with Dr. Xu/neurology. ASA was discontinued and he was maintained on Eliquis and Brilinta during his stay.  Follow-up CBC shows reactive leukocytosis has resolved and H&H is stable. His blood sugars were initially monitored on ACHS basis due to hyperglycemia from tube feeds.  As intake improved, tube feeds were discontinued and he he is tolerating regular diet without difficulty.     His blood sugars were noted to be improved of tube feeds therefore CBG checks were discontinued and patient and family has been educated on heart healthy carb modified diet.  P.o. intake has been good and he is continent of bowel and bladder.  Right foot x-rays were done due to reports of pain and was negative for fracture.  He continues on Lipitor for elevated LDL.   He has made steady gains during his stay and requires min to mod assist at wheelchair level.  Patient declined extending length of stay and home health therapy is in process of  being set is awaiting responses regarding acceptance from couple more agencies.     Rehab course: During patient's stay in rehab weekly team conferences were held to monitor patient's progress, set goals and discuss barriers to discharge. At admission, patient required mod to total assist with basic ADL task and total assist with mobility.  He exhibited severe expressive and  receptive aphasia with verbal output but limited to yeah and required dysphagia 3 diet with full supervision for safety. He has had improvement in activity tolerance, balance, postural control as well as ability to compensate for deficits. He is able to dress with min to mod assist and requires mod assist for most ADL tasks.  He has had significant improvement in motor planning as well as decrease in right inattention.   He requires min assist for squat pivot transfers versus use of study.  He is able to perform car transfers with heavy min assist.  He requires min to mod assist for standing.  He is able to ambulate 100 feet with +2 mod assist with skilled therapists. He requires min assist for basic comprehension and mod to max assist for communicating via Monday model communication.  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  He is tolerating regular diet without signs or symptoms of aspiration.  Family education was completed with wife and sister-in-law.     Visit 05/06/22 Patient is a 61 year old male who recently completed CIR after he had a left MCA CVA.  He is here with his wife.  He is here for for follow-up after his CVA.  Patient has had some increased swelling in his right lower extremity over the past few weeks.  His foot has also had a darker color since this time.  This swelling  color improves with elevation of his leg.  He has follow-up with the [place] PCP on August 10 scheduled.  He continues to have shoulder pain however this is improved with Tylenol.  His wife reports he is doing well overall and has improved in his communication.  Wife reports he is planned for a loop recorder, she wants to know if patient will be able to use a TENS unit for his right shoulder, e-stim for the foot with therapy when he gets displaced in a few weeks.   Interval History Gordon Fisher is a 61 year old male who is here with his wife for follow-up after his left MCA CVA.   His wife reports that he has been doing fairly well overall.  He is about to start outpatient PT OT and SLP.  He was seen by a physician at the [facility] and they report the physician did not make any major medical changes.  He is continued on Brilinta and his wife asks how long he needs to be on this medication.  She says they were originally told there would be about 6 months on the Brilinta.  He has been walking with a hemiwalker although his foot will often drag and sometimes catch on the on the floor when it is uneven.  His shoulder pain has improved and is not causing him significant discomfort.  The swelling in his foot has also improved.   Pain Inventory Average Pain 4 Pain Right Now 0 My pain is intermittent, tingling, and aching  LOCATION OF PAIN  shoulder, leg  BOWEL Number of stools per week: 7   BLADDER Normal    Mobility walk with assistance use a walker ability to  climb steps?  yes do you drive?  no use a wheelchair transfers alone  Function retired  Neuro/Psych trouble walking  Prior Studies Any changes since last visit?  no  Physicians involved in your care Any changes since last visit?  no   Family History  Problem Relation Age of Onset   Thyroid disease Mother    Social History   Socioeconomic History   Marital status: Married    Spouse name: Gordon Fisher   Number of children: 2   Years of education: Not on file   Highest education level: Associate degree: occupational, Hotel manager, or vocational program  Occupational History   Not on file  Tobacco Use   Smoking status: Never    Passive exposure: Never   Smokeless tobacco: Never  Vaping Use   Vaping Use: Never used  Substance and Sexual Activity   Alcohol use: Never   Drug use: Never   Sexual activity: Never  Other Topics Concern   Not on file  Social History Narrative   04/27/22 lives with wife   Social Determinants of Health   Financial Resource Strain: Not on file  Food Insecurity: Not on  file  Transportation Needs: Not on file  Physical Activity: Not on file  Stress: Not on file  Social Connections: Not on file   Past Surgical History:  Procedure Laterality Date   BUBBLE STUDY  02/15/2022   Procedure: BUBBLE STUDY;  Surgeon: Werner Lean, MD;  Location: [place];  Service: Cardiovascular;;   IR CT HEAD LTD  02/11/2022   IR CT HEAD LTD  02/11/2022   IR CT HEAD LTD  02/11/2022   IR PERCUTANEOUS ART THROMBECTOMY/INFUSION INTRACRANIAL INC DIAG ANGIO  02/11/2022   IR PERCUTANEOUS ART THROMBECTOMY/INFUSION INTRACRANIAL INC DIAG ANGIO  02/11/2022   IR US GUIDE VASC ACCESS RIGHT  02/11/2022   IR US GUIDE VASC ACCESS RIGHT  02/11/2022   RADIOLOGY WITH ANESTHESIA N/A 02/11/2022   Procedure: IR WITH ANESTHESIA;  Surgeon: Radiologist, Medication, MD;  Location: [place];  Service: Radiology;  Laterality: N/A;   RADIOLOGY WITH ANESTHESIA N/A 02/11/2022   Procedure: RADIOLOGY WITH ANESTHESIA;  Surgeon: Radiologist, Medication, MD;  Location: [place];  Service: Radiology;  Laterality: N/A;   TEE WITHOUT CARDIOVERSION N/A 02/15/2022   Procedure: TRANSESOPHAGEAL ECHOCARDIOGRAM (TEE);  Surgeon: Werner Lean, MD;  [facility]: [facility];  Service: Cardiovascular;  Laterality: N/A;   Past Medical History:  Diagnosis Date   Stroke ([place])    BP 119/80   Pulse 83   Ht 5\' 8"  (1.727 m)   Wt 184 lb 6.4 oz (83.6 kg)   SpO2 96%   BMI 28.04 kg/m   Opioid Risk Score:   Fall Risk Score:  `1  Depression screen [facility] 2/9     08/06/2022   11:49 AM 05/06/2022    1:07 PM 04/01/2022    1:38 PM  Depression screen [facility] 2/9  Decreased Interest 0 0 0  Down, Depressed, Hopeless 0 0 0  PHQ - 2 Score 0 0 0  Altered sleeping   0  Tired, decreased energy   0  Change in appetite   0  Feeling bad or failure about yourself    0  Trouble concentrating   0  Moving slowly or fidgety/restless   0  Suicidal thoughts   0  PHQ-9 Score   0     Review of Systems  Constitutional:  Positive for  unexpected weight change.  HENT: Negative.    Eyes:  Negative.   Respiratory: Negative.    Cardiovascular: Negative.   Gastrointestinal: Negative.   Endocrine: Negative.   Genitourinary: Negative.   Musculoskeletal:  Positive for gait problem.  Skin: Negative.   Allergic/Immunologic: Negative.   Hematological: Negative.   Psychiatric/Behavioral: Negative.        Objective:   Physical Exam   Gen: no distress, normal appearing HEENT: oral mucosa pink and moist, NCAT Cardio: Reg rate Chest: normal effort, normal rate of breathing Abd: soft, non-distended Ext: No cyanosis or clubbing Psych: pleasant, normal affect Skin: intact Neuro: Aphasia primarily expressive at this point.  He is able to nod yes or shake his head no.  He is able to follow simple commands.  He also uses gestures to communicate. Musculoskeletal: Strength 5 out of 5 left lower lower extremity and left upper extremity Strength 0 out of 5 right right upper extremity Strength 2/5 proximal 0-1/5 distal RLE Walks with hemiwalker, R Foot drop  Shoulder subluxation noted on the right MAS 2 increased tone in the right elbow flexers       Assessment & Plan:  Left MCA CVA  -Continue outpatient PT/OT/SLP -Spoke with Dr. Debbrah Alar, continue Brilinta until f/u with his clinic. Clinic well call to make f/u visit  -Continue to monitor RUE tone  2. R shoulder pain -improved continue tylenol PRN   3. R foot drop -Ordered R AFO

## 2022-08-09 ENCOUNTER — Ambulatory Visit (INDEPENDENT_AMBULATORY_CARE_PROVIDER_SITE_OTHER): Payer: 59

## 2022-08-09 DIAGNOSIS — I639 Cerebral infarction, unspecified: Secondary | ICD-10-CM | POA: Diagnosis not present

## 2022-08-10 LAB — CUP PACEART REMOTE DEVICE CHECK
Date Time Interrogation Session: 20231009085135
Implantable Pulse Generator Implant Date: 20230807
Pulse Gen Serial Number: 511016023

## 2022-08-11 ENCOUNTER — Ambulatory Visit: Payer: 59

## 2022-08-11 ENCOUNTER — Encounter: Payer: 59 | Admitting: Occupational Therapy

## 2022-08-11 ENCOUNTER — Other Ambulatory Visit (HOSPITAL_COMMUNITY): Payer: Self-pay | Admitting: Neuroradiology

## 2022-08-11 ENCOUNTER — Ambulatory Visit: Payer: 59 | Admitting: Physical Therapy

## 2022-08-11 ENCOUNTER — Other Ambulatory Visit: Payer: Self-pay | Admitting: Physical Medicine & Rehabilitation

## 2022-08-11 DIAGNOSIS — I63512 Cerebral infarction due to unspecified occlusion or stenosis of left middle cerebral artery: Secondary | ICD-10-CM

## 2022-08-12 ENCOUNTER — Telehealth (HOSPITAL_COMMUNITY): Payer: Self-pay | Admitting: Radiology

## 2022-08-12 ENCOUNTER — Other Ambulatory Visit (HOSPITAL_COMMUNITY): Payer: Self-pay | Admitting: Neuroradiology

## 2022-08-12 MED ORDER — TICAGRELOR 90 MG PO TABS: 90.0000 mg | ORAL_TABLET | Freq: Two times a day (BID) | ORAL | 1 refills | Status: DC

## 2022-08-12 NOTE — Telephone Encounter (Signed)
Mrs. Pascucci called and left a VM that Mr. Ida needs a refill on his Brilinta. Message sent to Dr. Debbrah Alar to send in a refill to his pharmacy in [place]. JM

## 2022-08-13 ENCOUNTER — Ambulatory Visit: Payer: 59

## 2022-08-13 ENCOUNTER — Ambulatory Visit: Payer: 59 | Admitting: Occupational Therapy

## 2022-08-13 DIAGNOSIS — R2689 Other abnormalities of gait and mobility: Secondary | ICD-10-CM

## 2022-08-13 DIAGNOSIS — R2681 Unsteadiness on feet: Secondary | ICD-10-CM

## 2022-08-13 DIAGNOSIS — R262 Difficulty in walking, not elsewhere classified: Secondary | ICD-10-CM

## 2022-08-13 DIAGNOSIS — R278 Other lack of coordination: Secondary | ICD-10-CM

## 2022-08-13 DIAGNOSIS — R4701 Aphasia: Secondary | ICD-10-CM

## 2022-08-13 DIAGNOSIS — R482 Apraxia: Secondary | ICD-10-CM

## 2022-08-13 DIAGNOSIS — I69351 Hemiplegia and hemiparesis following cerebral infarction affecting right dominant side: Secondary | ICD-10-CM

## 2022-08-13 DIAGNOSIS — M6281 Muscle weakness (generalized): Secondary | ICD-10-CM

## 2022-08-13 DIAGNOSIS — R208 Other disturbances of skin sensation: Secondary | ICD-10-CM

## 2022-08-13 NOTE — Therapy (Signed)
OUTPATIENT SPEECH LANGUAGE PATHOLOGY TREATMENT   Patient Name: Gordon Fisher MRN: [id number] DOB:11-15-1960, 61 y.o., male Today's Date: 08/13/2022  PCP: TBD REFERRING PROVIDER: Jennye Boroughs, MD    End of Session - 08/13/22 1226     Visit Number 2    Number of Visits 25    Date for SLP Re-Evaluation 10/24/22    Authorization - Number of Visits 20    SLP Start Time 0850    SLP Stop Time  0930    SLP Time Calculation (min) 40 min    Activity Tolerance Patient tolerated treatment well              Past Medical History:  Diagnosis Date   Stroke [facility])    Past Surgical History:  Procedure Laterality Date   BUBBLE STUDY  02/15/2022   Procedure: BUBBLE STUDY;  Surgeon: Werner Lean, MD;  Location: [place];  Service: Cardiovascular;;   IR CT HEAD LTD  02/11/2022   IR CT HEAD LTD  02/11/2022   IR CT HEAD LTD  02/11/2022   IR PERCUTANEOUS ART THROMBECTOMY/INFUSION INTRACRANIAL INC DIAG ANGIO  02/11/2022   IR PERCUTANEOUS ART THROMBECTOMY/INFUSION INTRACRANIAL INC DIAG ANGIO  02/11/2022   IR US GUIDE VASC ACCESS RIGHT  02/11/2022   IR US GUIDE VASC ACCESS RIGHT  02/11/2022   RADIOLOGY WITH ANESTHESIA N/A 02/11/2022   Procedure: IR WITH ANESTHESIA;  Surgeon: Radiologist, Medication, MD;  Location: [place];  Service: Radiology;  Laterality: N/A;   RADIOLOGY WITH ANESTHESIA N/A 02/11/2022   Procedure: RADIOLOGY WITH ANESTHESIA;  Surgeon: Radiologist, Medication, MD;  Location: [place];  Service: Radiology;  Laterality: N/A;   TEE WITHOUT CARDIOVERSION N/A 02/15/2022   Procedure: TRANSESOPHAGEAL ECHOCARDIOGRAM (TEE);  Surgeon: Werner Lean, MD;  Location: [facility] ENDOSCOPY;  Service: Cardiovascular;  Laterality: N/A;   Patient Active Problem List   Diagnosis Date Noted   Hypocalcemia 03/22/2022   DVT, lower extremity, distal ([place]) 03/22/2022   Obesity 03/22/2022   Elevated LDL cholesterol level 03/22/2022   Acute ischemic left middle cerebral artery (MCA) stroke  ([place]) 02/18/2022   Cerebrovascular accident (CVA) due to occlusion of left middle cerebral artery ([place]) 02/11/2022    ONSET DATE: 02-11-22   REFERRING DIAG: I63.9 (ICD-10-CM) - Cerebrovascular accident (CVA), unspecified mechanism ([place]) R13.10 (ICD-10-CM) - Dysphagia, unspecified type   THERAPY DIAG:  Aphasia  Verbal apraxia  Rationale for Evaluation and Treatment Rehabilitation  SUBJECTIVE:   SUBJECTIVE STATEMENT: "Yeah." Pt accompanied by: significant other  PERTINENT HISTORY: Gordon Fisher is a 61 year old male in relatively good health who was admitted via [facility] on 02/11/22 with right sided weakness, right facial droop and slurred speech. CTH showed L-MCA hyperdense sign and he received TNK prior to transfer. CTA showed L-MCA MI occlusion and he underwent cerebral angio with thrombectomy and TICI 3 flow by Dr. Nelida Gores [name].  Follow up MRI brain done revealing small areas of acute infarct infarct in L-MCA territory with mild hemorrhagic transformation in left parietal lobe and L-MCA bifurcation residual vascular thrombus. He was taken back for thrombectomy with stent for dissection but had intra-stent occlusion which was unable to be recanalized. Post op CT showed small SAH which was stable on follow up. He was started on ASA/Brillinta. MBS 04/18 showed oral dysphagia with oral delay and no penetration or aspiration. He was started on D3, thins with supervision. Repeat MRI brain on 04/16 showed extensive evolving acute ischemic L-MCA occlusion increased in size with associated edema, few additional  small acute cortical subcortical infarcts involving contralateral right frontal lobe and left ACA distribution. Stroke felt to be due to unknown etiology and wife elected on Zio patch on discharge to rule out A fib. 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 Gordon Fisher was d/c'd from [facility] on  03-19-22, and has had HHST since that time until last week.   PAIN:  Are you having pain? No  PATIENT GOALS Pt indicated he would like talking to improve  OBJECTIVE:  DIAGNOSTIC FINDINGS: MRI without contrast 02/13/22 IMPRESSION: 1. Extensive evolving acute ischemic left MCA distribution infarct, increased in size as compared to previous MRI from 02/11/2022. Associated edema without significant midline shift. 2. Small volume acute subarachnoid hemorrhage at the posterior aspect of the left Sylvian fissure, stable. 3. Few additional small volume acute ischemic cortical and subcortical infarcts involving the contralateral right frontal lobe and left ACA distribution as above. 4. Underlying chronic microvascular ischemic disease.    PATIENT REPORTED OUTCOME MEASURES (PROM): Communication Participation Item Bank: will be provided in the first 2-3 weeks of therapy   TODAY'S TREATMENT:  08/13/22: SLP targeted multimodal communication with pt today. SLP used written key word cues, multiple choice written responses, and internet pictures for multiple choice, and pt used drawing, gesturing, and Google Maps to communicate that he has two watercraft he uses to fish on a pond on his property and on [address], and catches multiple types of fish. SLP modeled/highlighted these options to pt's wife for them to use at home. Pt/wife have been practicing body parts at home for pain management. SLP suggested pt might want to also practice clothing items and put body parts on hold, due to gestures usually accurate without verbal explanation for pain.  07-26-22: SLP reviewed the results of the evaluation and requested pt's wife Gordon Fisher put written names by pt's photos he has been practicing with.   PATIENT EDUCATION: Education details: home tasks Person educated: Patient and Spouse Education method: Medical illustrator Education comprehension: verbalized understanding and needs further  education   GOALS: Goals reviewed with patient? Yes, in general  SHORT TERM GOALS: Target date: 08/24/2022   Pt will name 6 salient family names when given picture and written name with consistent mod-max cues in 3 sessions (precursor for speech generating device) Baseline: Goal status: Ongoing  2.  Pt will produce simple phoneme/ bilabial+/a/  /a/, /ba/, /ma/, and/or /pa/ simultaneously with SLP 65% success (equal # reps/stimulus) in 2 sessions Baseline:  Goal status: Ongoing  3.  Pt will demo understanding of simple commands in context 80% over three sessions Baseline:  Goal status: Ongoing  4.  Pt will match word f:5 given a picture 80% of the time in 3 sessions (precursor for speech generating device) Baseline:  Goal status: Ongoing  5.  Pt will copy written family names with 50% success in two sessions  Baseline:  Goal Status: Ongoing   LONG TERM GOALS: Target date: 10/24/2022  (or 20th visit)  Pt will name 6 salient family names when given picture and written name with consistent mod cues in 3 sessions (precursor for speech generating device) Baseline:  Goal status: Ongoing  2.  Pt will IMITATE simple phoneme/bilabial+/a/  /a/, /ba/, /ma/, and/or /pa/  65% success (equal # reps/stimulus) in 2 sessions Baseline:  Goal status: Ongoing  3.  Pt will generate meaningful message understood by Gordon Fisher using speech generating device 60% of the time  in 3 sessions Baseline:  Goal status: Ongoing  4.  Gordon Fisher will indicate that spontaneous communication is easier than during week of evaluation Baseline:  Goal status: Ongoing   ASSESSMENT:  CLINICAL IMPRESSION: Patient is a 61 y.o. male who was seen today for treatment of aphasia. 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 SEE TX NOTE from today for more details.  OBJECTIVE IMPAIRMENTS include expressive language, receptive language, and aphasia.  These impairments are limiting patient from return to work, managing medications, managing appointments, household responsibilities, ADLs/IADLs, and effectively communicating at home and in community. Factors affecting potential to achieve goals and functional outcome are ability to learn/carryover information and severity of impairments. Patient will benefit from skilled SLP services to address above impairments and improve overall function.  REHAB POTENTIAL: Good  PLAN: SLP FREQUENCY: 2x/week  SLP DURATION: 12 weeks  PLANNED INTERVENTIONS: Language facilitation, Environmental controls, Cueing hierachy, Internal/external aids, Functional tasks, and Multimodal communication approach    Uchenna Seufert, CCC-SLP 08/13/2022, 12:26 PM

## 2022-08-13 NOTE — Therapy (Signed)
OUTPATIENT OCCUPATIONAL THERAPY NEURO EVALUATION  Patient Name: Gordon Fisher MRN: [id number] DOB:11/22/1960, 61 y.o., male Today's Date: 08/13/2022  PCP: none on file REFERRING PROVIDER: Fanny Dance, MD    OT End of Session - 08/13/22 1021     Visit Number 3    Number of Visits 17    Date for OT Re-Evaluation 09/24/22    Authorization Type [employer]    Authorization - Visit Number 3    Authorization - Number of Visits 20    OT Start Time [phone]    OT Stop Time 1017    OT Time Calculation (min) 43 min    Activity Tolerance Patient tolerated treatment well    Behavior During Therapy WFL for tasks assessed/performed               Past Medical History:  Diagnosis Date   Stroke [facility])    Past Surgical History:  Procedure Laterality Date   BUBBLE STUDY  02/15/2022   Procedure: BUBBLE STUDY;  Surgeon: Christell Constant, MD;  Location: [facility] ENDOSCOPY;  Service: Cardiovascular;;   IR CT HEAD LTD  02/11/2022   IR CT HEAD LTD  02/11/2022   IR CT HEAD LTD  02/11/2022   IR PERCUTANEOUS ART THROMBECTOMY/INFUSION INTRACRANIAL INC DIAG ANGIO  02/11/2022   IR PERCUTANEOUS ART THROMBECTOMY/INFUSION INTRACRANIAL INC DIAG ANGIO  02/11/2022   IR US GUIDE VASC ACCESS RIGHT  02/11/2022   IR US GUIDE VASC ACCESS RIGHT  02/11/2022   RADIOLOGY WITH ANESTHESIA N/A 02/11/2022   Procedure: IR WITH ANESTHESIA;  Surgeon: Radiologist, Medication, MD;  Location: [facility] OR;  Service: Radiology;  Laterality: N/A;   RADIOLOGY WITH ANESTHESIA N/A 02/11/2022   Procedure: RADIOLOGY WITH ANESTHESIA;  Surgeon: Radiologist, Medication, MD;  Location: [facility] OR;  Service: Radiology;  Laterality: N/A;   TEE WITHOUT CARDIOVERSION N/A 02/15/2022   Procedure: TRANSESOPHAGEAL ECHOCARDIOGRAM (TEE);  Surgeon: Christell Constant, MD;  Location: [facility] ENDOSCOPY;  Service: Cardiovascular;  Laterality: N/A;   Patient Active Problem List   Diagnosis Date Noted   Hypocalcemia 03/22/2022   DVT, lower extremity,  distal ([facility]) 03/22/2022   Obesity 03/22/2022   Elevated LDL cholesterol level 03/22/2022   Acute ischemic left middle cerebral artery (MCA) stroke ([facility]) 02/18/2022   Cerebrovascular accident (CVA) due to occlusion of left middle cerebral artery ([facility]) 02/11/2022    ONSET DATE: referral 07/15/22 (CVA 02/11/22)  REFERRING DIAG: I63.9 (ICD-10-CM) - Cerebrovascular accident (CVA), unspecified mechanism ([facility]) R13.10 (ICD-10-CM) - Dysphagia, unspecified type   THERAPY DIAG:  Hemiplegia and hemiparesis following cerebral infarction affecting right dominant side ([facility])  Other lack of coordination  Muscle weakness (generalized)  Other disturbances of skin sensation  Unsteadiness on feet  Rationale for Evaluation and Treatment Rehabilitation  SUBJECTIVE:   SUBJECTIVE STATEMENT: Pt's spouse asking about GivMohr sling. Pt accompanied by: family member (wife, Gordon Fisher)  PERTINENT HISTORY: in relatively good health prior to CVA  PRECAUTIONS: Fall  WEIGHT BEARING RESTRICTIONS No  PAIN:  Are you having pain? No  FALLS: Has patient fallen in last 6 months? Yes. Number of falls 1  PLOF: Independent, Independent with basic ADLs, and Independent with gait  PATIENT GOALS continue to make gains, independence with getting dressed  OBJECTIVE:   HAND DOMINANCE: Right  FUNCTIONAL OUTCOME MEASURES: FOTO: 11  UPPER EXTREMITY ROM    AROM: Pt with no active ROM during assessment.  Pt was able to elicit min shoulder elevation and slight gross grasp when cued and provided increased time to initiate  movement.    Passive ROM Right eval Left eval  Shoulder flexion 100   Shoulder abduction    Shoulder adduction    Shoulder extension    Shoulder internal rotation WNL   Shoulder external rotation Onset of pain >5-10* from neutral   Middle trapezius    Lower trapezius    Elbow flexion WNL   Elbow extension WNL   Wrist flexion WNL   Wrist extension WNL   Wrist ulnar deviation    Wrist radial  deviation    Wrist pronation    Wrist supination    (Blank rows = not tested)  HAND FUNCTION: No functional grasp.  Trace gross grasp with increased time for initiation  SENSATION: Difficult to assess due to expressive aphasia  MUSCLE TONE: RUE: Mild and Hypertonic  ---------------------------------------------------------------------------------------------------------------------------------------------------------------------- (objective measures above completed at initial evaluation unless otherwise dated)  TODAY'S TREATMENT:  08/13/22 Table slides: OT facilitating shoulder flexion, elbow extension, and horizontal abduction/adduction while performing towel slides on table.  Pt demonstrating increased finger flexion, therefore placed roll of foam in hand to decrease interference with task.  Pt initially requiring hand over hand fading to min tactile cue at elbow to minimize effects of gravity on movement allowing for increased ROM in all directions.  OT providing targets to further facilitate change of directions during towel slides. Self-ROM: Reiterated self-ROM exercises, previously administered in IP Rehab, with additional instructions for improved technique and provided with updated handout Trialed GivMohr sling: OT applied GivMohr sling to RUE with education on proper donning technique and fit of sling.  Pt ambulated 20' while wearing sling, but shaking head no and gesturing to hand when asking if he liked sling/would wear it.  May benefit from additional trial during therapy sessions to allow for more natural arm swing and support at hand prior to purchase if recommended. Splint wear: discussed resting hand splint (from hospital) for night time wear as pt demonstrating increased tone in hand and fingers this date.  Discussed importance of stretching hand and hygiene in hand if maintained in flexed fist.     08/04/22 Supine AAROM: OT facilitating shoulder abduction/adduction and  internal rotation in supine.  OT providing facilitation for improved positioning and intermittent tactile cues to facilitate external rotation.  Pt able to demonstrate adduction from 90* to near neutral and abduction to ~30* with therapist support under elbow at at wrist for improved alignment.  OT facilitating elbow flexion/extension in supine at neutral with pt able to complete elbow extension to neutral and elbow extension and flexion when positioned with shoulder at 90* flexion to minimize effects of gravity on elbow flexion.  Pt able to extend against gravity in this position.   Provided hand over hand facilitation for wife and demonstration of hand placement and facilitation during activity.  Provided with written handout of instructions for hand placement and exercise technique.   PATIENT EDUCATION: Education details: Educated on AAROM for shoulder and elbow in supine. Person educated: Patient and Spouse Education method: Explanation, Demonstration, Tactile cues, Verbal cues, and Handouts Education comprehension: verbalized understanding and needs further education   HOME EXERCISE PROGRAM: Written exercises of shoulder and elbow in supine with cues for hand placement and facilitation.    GOALS: Goals reviewed with patient? Yes  SHORT TERM GOALS: Target date: 08/27/22  Pt and spouse will be independent with PROM and self-ROM HEP for shoulder stability and ROM. Baseline: Goal status: IN PROGRESS  2.  Pt will verbalize understanding of task modifications and/or potential  A/E needs to increase ease, safety, and independence w/ ADLs Baseline:  Goal status: IN PROGRESS  3.  Pt will verbalize understanding of strategies and use of RUE as stabilizer during ADLs. Baseline:  Goal status: IN PROGRESS   LONG TERM GOALS: Target date: 09/24/22  Pt will demonstrate ability to complete UB dressing with supervision with use of hemi-dressing technique. Baseline:  Goal status: IN  PROGRESS  2.  Pt will demonstrate improved awareness of and positioning of RUE during mobility and self-care tasks. Baseline:  Goal status: IN PROGRESS  3.  Pt will utilize RUE as gross assist during grooming tasks (such as washing hands and applying toothpaste). Baseline:  Goal status: IN PROGRESS  4.  Pt will verbalize and or demonstrate increased functional use of BUE during LB dressing to complete at supervision level. Baseline:  Goal status: IN PROGRESS  5.  Pt will verbalize improved functional incorporation of RUE with ADLs by reporting improved FOTO score to >/= to 20. Baseline: 11 Goal status: IN PROGRESS    ASSESSMENT:  CLINICAL IMPRESSION: Treatment session with focus on shoulder flexion and elbow extension in upright position.  Pt able to complete with min facilitation for shoulder flexion and min-mod for abduction.  OT providing cues for breathing as pt initially holding breath and activating whole body to initiate movement. Reiterated self-ROM exercises and splint wear at night time for finger extension.  PERFORMANCE DEFICITS in functional skills including ADLs, IADLs, coordination, sensation, tone, ROM, strength, pain, flexibility, FMC, GMC, mobility, balance, body mechanics, vision, and UE functional use and psychosocial skills including environmental adaptation and routines and behaviors.   IMPAIRMENTS are limiting patient from ADLs and IADLs.   COMORBIDITIES may have co-morbidities  that affects occupational performance. Patient will benefit from skilled OT to address above impairments and improve overall function.  MODIFICATION OR ASSISTANCE TO COMPLETE EVALUATION: Min-Moderate modification of tasks or assist with assess necessary to complete an evaluation.  OT OCCUPATIONAL PROFILE AND HISTORY: Detailed assessment: Review of records and additional review of physical, cognitive, psychosocial history related to current functional performance.  CLINICAL DECISION  MAKING: Moderate - several treatment options, min-mod task modification necessary  REHAB POTENTIAL: Good  EVALUATION COMPLEXITY: Moderate    PLAN: OT FREQUENCY: 2x/week  OT DURATION: 8 weeks  PLANNED INTERVENTIONS: self care/ADL training, therapeutic exercise, therapeutic activity, neuromuscular re-education, manual therapy, passive range of motion, balance training, functional mobility training, splinting, electrical stimulation, compression bandaging, moist heat, cryotherapy, patient/family education, visual/perceptual remediation/compensation, psychosocial skills training, energy conservation, coping strategies training, and DME and/or AE instructions  RECOMMENDED OTHER SERVICES: N/A  CONSULTED AND AGREED WITH PLAN OF CARE: Patient and family member/caregiver  PLAN FOR NEXT SESSION: Review PROM and self-ROM HEP, educate on use of RUE as stabilizer during ADLs/functional tasks.   Simonne Come, OTR/L 08/13/2022, 10:21 AM

## 2022-08-13 NOTE — Therapy (Signed)
OUTPATIENT PHYSICAL THERAPY NEURO TREATMENT   Patient Name: Gordon Fisher MRN: [id number] DOB:[birth year], 61 y.o., male Today's Date: 08/13/2022   PCP: None listed REFERRING PROVIDER: Fanny Dance, MD    PT End of Session - 08/13/22 1019     Visit Number 3    Number of Visits 16    Date for PT Re-Evaluation 09/20/22    Authorization Type [employer] [id number]    Authorization - Visit Number 3    Authorization - Number of Visits 20    Progress Note Due on Visit 10    PT Start Time 1015    PT Stop Time 1100    PT Time Calculation (min) 45 min    Equipment Utilized During Treatment Gait belt    Activity Tolerance Patient tolerated treatment well    Behavior During Therapy WFL for tasks assessed/performed             Past Medical History:  Diagnosis Date   Stroke [facility])    Past Surgical History:  Procedure Laterality Date   BUBBLE STUDY  02/15/2022   Procedure: BUBBLE STUDY;  Surgeon: Christell Constant, MD;  Location: [facility]COPY;  Service: Cardiovascular;;   IR CT HEAD LTD  02/11/2022   IR CT HEAD LTD  02/11/2022   IR CT HEAD LTD  02/11/2022   IR PERCUTANEOUS ART THROMBECTOMY/INFUSION INTRACRANIAL INC DIAG ANGIO  02/11/2022   IR PERCUTANEOUS ART THROMBECTOMY/INFUSION INTRACRANIAL INC DIAG ANGIO  02/11/2022   IR US GUIDE VASC ACCESS RIGHT  02/11/2022   IR US GUIDE VASC ACCESS RIGHT  02/11/2022   RADIOLOGY WITH ANESTHESIA N/A 02/11/2022   Procedure: IR WITH ANESTHESIA;  Surgeon: Radiologist, Medication, MD;  Location: [facility] OR;  Service: Radiology;  Laterality: N/A;   RADIOLOGY WITH ANESTHESIA N/A 02/11/2022   Procedure: RADIOLOGY WITH ANESTHESIA;  Surgeon: Radiologist, Medication, MD;  Location: [facility] OR;  Service: Radiology;  Laterality: N/A;   TEE WITHOUT CARDIOVERSION N/A 02/15/2022   Procedure: TRANSESOPHAGEAL ECHOCARDIOGRAM (TEE);  Surgeon: Christell Constant, MD;  Location: [facility] ENDOSCOPY;  Service: Cardiovascular;  Laterality: N/A;   Patient Active Problem List    Diagnosis Date Noted   Hypocalcemia 03/22/2022   DVT, lower extremity, distal (HCC) 03/22/2022   Obesity 03/22/2022   Elevated LDL cholesterol level 03/22/2022   Acute ischemic left middle cerebral artery (MCA) stroke (HCC) 02/18/2022   Cerebrovascular accident (CVA) due to occlusion of left middle cerebral artery (HCC) 02/11/2022    ONSET DATE: April 2023  REFERRING DIAG: I63.9 (ICD-10-CM) - Cerebrovascular accident (CVA), unspecified mechanism (HCC) R13.10 (ICD-10-CM) - Dysphagia, unspecified type   THERAPY DIAG:  Difficulty in walking, not elsewhere classified  Muscle weakness (generalized)  Unsteadiness on feet  Other abnormalities of gait and mobility  Rationale for Evaluation and Treatment Rehabilitation  SUBJECTIVE:  SUBJECTIVE STATEMENT: Orthotic appointment 10/24  Pt accompanied by: significant other  PERTINENT HISTORY: 02/11/2022 patient developed right-sided weakness, aphasia, went to hospital for evaluation.  Code stroke was activated.  He received TNK and then transferred to [facility].  He underwent cerebral angiogram with thrombectomy.  Unfortunately vessel reoccluded later the day and the second thrombectomy was attempted.  Left M2 dissection was identified and treated with stent but unfortunately stent demonstrated reocclusion   PAIN:  Are you having pain? No  PRECAUTIONS: Shoulder and Fall, Aphasia   WEIGHT BEARING RESTRICTIONS No  FALLS: Has patient fallen in last 6 months? Yes. Number of falls 1, required 3-assist to recover  LIVING ENVIRONMENT: Lives with: lives with their spouse Lives in: House/apartment Stairs: Yes: Internal: flight steps; bilateral but cannot reach both and External: 4-5 steps; ramp is also present.  Has following equipment at home: Hemi  walker, Wheelchair (manual), and sit-stand lift  PLOF: Independent  PATIENT GOALS regain independence  OBJECTIVE:   TODAY'S TREATMENT: 08/13/22 Activity Comments  Sit-stand stride stance 3x5 Therapist blocking to maintain foot position with RLE bias, LUE push-off required  Standing on foam -3x10 LLE step descent, facilitation RLE -3x10 sec eyes closed--cues for right weight shift -head turns 5x EO/EC--facilitation RLE extension  Standing w/ LLE elevated on 12" step 3x30 sec for RLE forced weight bearing  RLE step-ups on stair case 4x10--reciprocal pattern Therapist facilitates RLE mechanics to promote LLE advancement   NU-step x 8 min Therapist facilitating right knee extension and hip adduction stability 50% of time        DIAGNOSTIC FINDINGS: Follow-up MRI brain done revealing small areas of acute infarct in left-MCA territory with mild hemorrhagic transformation in left parietal lobe and left-MCA bifurcation residual vascular thrombus   COGNITION: Overall cognitive status:  Difficult to assess due to aphasia   SENSATION: Light touch: Impaired  Proprioception: Impaired   COORDINATION: RLE impaired, some deficits on left foot with alternating movements and isolated movements  EDEMA:  None   MUSCLE TONE: RLE: Modifed Ashworth Scale 2 = More marked increase in muscle tone through most of the ROM, but affected part(s) easily moved right quad most affected, no clonus right plantarflexors   MUSCLE LENGTH: Able to achieve full right knee extension to PROM  DTRs:  Patella 3+ = Brisk  POSTURE: No Significant postural limitations  LOWER EXTREMITY ROM:     LLE WNL, RLE AROM limited by weakness  LOWER EXTREMITY MMT:    MMT Right Eval Left Eval  Hip flexion 3- 5  Hip extension    Hip abduction 3- 4  Hip adduction 3- 5  Hip internal rotation    Hip external rotation    Knee flexion 3 5  Knee extension 2+ 5  Ankle dorsiflexion 0 5  Ankle plantarflexion 1 3+  Ankle  inversion    Ankle eversion    (Blank rows = not tested)  BED MOBILITY:  NT  TRANSFERS: Assistive device utilized: Hemi walker  Sit to stand: Modified independence Stand to sit: SBA Chair to chair: SBA and CGA Floor:  Total assist  RAMP:  Level of Assistance: CGA Assistive device utilized: Hemi walker Ramp Comments:   CURB:  Level of Assistance: Min A Assistive device utilized: Education officer, environmental Comments: cues in sequence  STAIRS:  Level of Assistance: CGA and Min A  Stair Negotiation Technique: Step to Pattern with Single Rail on Left  Number of Stairs: 10   Height of Stairs: 4-6"  Comments:  cues and guarding  GAIT: Gait pattern: step to pattern, decreased ankle dorsiflexion- Right, and circumduction- Right Distance walked: 82 Assistive device utilized: Hemi walker Level of assistance: CGA Comments: deficits during turning  FUNCTIONAL TESTs:  5 times sit to stand: 11.59 sec with 3 retro LOB and poor eccentric control Timed up and go (TUG): 34.75 sec Berg Balance Scale: 32/56 Dynamic Gait Index: NT 2 minute walk test: 82 ft w/ hemi-walker, 0.68 ft/sec  M-CTSIB  Condition 1: Firm Surface, EO 30 Sec, Normal and Mild Sway  Condition 2: Firm Surface, EC 30 Sec, Mild and Moderate Sway  Condition 3: Foam Surface, EO 30 Sec, Mild Sway  Condition 4: Foam Surface, EC 26 Sec, Moderate and Severe Sway     PATIENT SURVEYS:  FOTO 47%  TODAY'S TREATMENT:  assessment   PATIENT EDUCATION: Education details: assessment findings, PT scope of practice Person educated: Patient and Spouse Education method: Explanation Education comprehension: verbalized understanding   HOME EXERCISE PROGRAM: TBD    GOALS: Goals reviewed with patient? Yes  SHORT TERM GOALS: Target date: 08/23/2022  Patient will perform HEP with family/caregiver supervision for improved strength, balance, transfers, and gait  Baseline: Goal status: On-going  2.  Ambulate 12 stairs with left HR  and supervision to improve safety within home Baseline: CGA-min A Goal status: on-going  3.  Demonstrate improved safety and efficiency with ambulation per distance of 100 ft during Baseline: 82 ft w/ HW and CGA Goal status: on-going    LONG TERM GOALS: Target date: 09/20/2022  Demonstrate improved balance and reduced risk for falls per score 40/56 Berg Balance Test Baseline: 32/56 Goal status: INITIAL  2.  Demonstrate modified independent ambulation x 150 ft level surfaces using least restrictive AD in order to improve safety with home environment Baseline: CGA w/ HW Goal status: INITIAL  3.  Manifest improved safety with gait per time of 18 sec TUG test using least restrictive AD Baseline: 34.75 sec w/ HW Goal status: INITIAL    ASSESSMENT:  CLINICAL IMPRESSION: Demonstrating improved proprioceptive awareness as manifest by activity of standing on foam and performing eyes closed activities and able to spontaneously orient and weight shift to RLE 50-75% of the time with minimal cueing aside from use of mirror for visual reference. Tolerating forced weight bearing for RLE quite well and able to elicit closed chain knee extension 50% of the time and progressing to perform activities requiring fast, alternating, reciprocal movements to good effect.  Difficulty with volitional control of right ankle demo 0/5 muscle force, 2+ right quad, 2+ to 3- right hamstring and hip add/abd weakness as assessed in sitting. Continued sessions indicated to facilitate motor control/coordination and improve safety with transfers and gait.     OBJECTIVE IMPAIRMENTS Abnormal gait, decreased activity tolerance, decreased balance, decreased coordination, decreased knowledge of use of DME, decreased mobility, difficulty walking, decreased strength, impaired sensation, impaired tone, impaired UE functional use, and improper body mechanics.   ACTIVITY LIMITATIONS carrying, lifting, bending, standing,  squatting, stairs, transfers, bathing, toileting, dressing, reach over head, and locomotion level  PARTICIPATION LIMITATIONS: meal prep, cleaning, laundry, interpersonal relationship, driving, shopping, community activity, occupation, yard work, and leisure activities  PERSONAL FACTORS Time since onset of injury/illness/exacerbation are also affecting patient's functional outcome.   REHAB POTENTIAL: Excellent  CLINICAL DECISION MAKING: Evolving/moderate complexity  EVALUATION COMPLEXITY: Moderate  PLAN: PT FREQUENCY: 2x/week  PT DURATION: 8 weeks  PLANNED INTERVENTIONS: Therapeutic exercises, Therapeutic activity, Neuromuscular re-education, Balance training, Gait training, Patient/Family education, Self  Care, Joint mobilization, Stair training, Vestibular training, Canalith repositioning, Orthotic/Fit training, DME instructions, Aquatic Therapy, Dry Needling, Electrical stimulation, Spinal mobilization, Cryotherapy, Moist heat, Splintting, Taping, and Manual therapy  PLAN FOR NEXT SESSION: ,stride stance sit-stand review, forced weightbearing, quadruped?   10:20 AM, 08/13/22 M. Sherlyn Lees, PT, DPT Physical Therapist- [place] Office Number: [phone]

## 2022-08-16 ENCOUNTER — Ambulatory Visit: Payer: 59 | Admitting: Occupational Therapy

## 2022-08-16 ENCOUNTER — Ambulatory Visit: Payer: 59

## 2022-08-16 DIAGNOSIS — R278 Other lack of coordination: Secondary | ICD-10-CM

## 2022-08-16 DIAGNOSIS — I69351 Hemiplegia and hemiparesis following cerebral infarction affecting right dominant side: Secondary | ICD-10-CM

## 2022-08-16 DIAGNOSIS — R262 Difficulty in walking, not elsewhere classified: Secondary | ICD-10-CM

## 2022-08-16 DIAGNOSIS — M6281 Muscle weakness (generalized): Secondary | ICD-10-CM

## 2022-08-16 DIAGNOSIS — R2689 Other abnormalities of gait and mobility: Secondary | ICD-10-CM

## 2022-08-16 DIAGNOSIS — R208 Other disturbances of skin sensation: Secondary | ICD-10-CM

## 2022-08-16 DIAGNOSIS — R4701 Aphasia: Secondary | ICD-10-CM

## 2022-08-16 DIAGNOSIS — R2681 Unsteadiness on feet: Secondary | ICD-10-CM

## 2022-08-16 DIAGNOSIS — R482 Apraxia: Secondary | ICD-10-CM

## 2022-08-16 NOTE — Therapy (Signed)
OUTPATIENT SPEECH LANGUAGE PATHOLOGY TREATMENT   Patient Name: Gordon Fisher MRN: [id number] DOB:[birth year], 61 y.o., male Today's Date: 08/16/2022  PCP: TBD REFERRING PROVIDER: Jennye Boroughs, MD    End of Session - 08/16/22 1327     Visit Number 3    Number of Visits 25    Date for SLP Re-Evaluation 10/24/22    Authorization - Number of Visits 20    Progress Note Due on Visit 3    SLP Start Time 1234    SLP Stop Time  1320    SLP Time Calculation (min) 46 min    Activity Tolerance Patient tolerated treatment well               Past Medical History:  Diagnosis Date   Stroke [facility])    Past Surgical History:  Procedure Laterality Date   BUBBLE STUDY  02/15/2022   Procedure: BUBBLE STUDY;  Surgeon: Werner Lean, MD;  Location: [place];  Service: Cardiovascular;;   IR CT HEAD LTD  02/11/2022   IR CT HEAD LTD  02/11/2022   IR CT HEAD LTD  02/11/2022   IR PERCUTANEOUS ART THROMBECTOMY/INFUSION INTRACRANIAL INC DIAG ANGIO  02/11/2022   IR PERCUTANEOUS ART THROMBECTOMY/INFUSION INTRACRANIAL INC DIAG ANGIO  02/11/2022   IR US GUIDE VASC ACCESS RIGHT  02/11/2022   IR US GUIDE VASC ACCESS RIGHT  02/11/2022   RADIOLOGY WITH ANESTHESIA N/A 02/11/2022   Procedure: IR WITH ANESTHESIA;  Surgeon: Radiologist, Medication, MD;  Location: [place];  Service: Radiology;  Laterality: N/A;   RADIOLOGY WITH ANESTHESIA N/A 02/11/2022   Procedure: RADIOLOGY WITH ANESTHESIA;  Surgeon: Radiologist, Medication, MD;  Location: [place];  Service: Radiology;  Laterality: N/A;   TEE WITHOUT CARDIOVERSION N/A 02/15/2022   Procedure: TRANSESOPHAGEAL ECHOCARDIOGRAM (TEE);  Surgeon: Werner Lean, MD;  Location: [facility] ENDOSCOPY;  Service: Cardiovascular;  Laterality: N/A;   Patient Active Problem List   Diagnosis Date Noted   Hypocalcemia 03/22/2022   DVT, lower extremity, distal ([place]) 03/22/2022   Obesity 03/22/2022   Elevated LDL cholesterol level 03/22/2022   Acute ischemic left  middle cerebral artery (MCA) stroke ([place]) 02/18/2022   Cerebrovascular accident (CVA) due to occlusion of left middle cerebral artery ([place]) 02/11/2022    ONSET DATE: 02-11-22   REFERRING DIAG: I63.9 (ICD-10-CM) - Cerebrovascular accident (CVA), unspecified mechanism ([place]) R13.10 (ICD-10-CM) - Dysphagia, unspecified type   THERAPY DIAG:  Aphasia  Verbal apraxia  Rationale for Evaluation and Treatment Rehabilitation  SUBJECTIVE:   SUBJECTIVE STATEMENT: "Hi." Pt accompanied by: significant other  PERTINENT HISTORY: Gordon Fisher is a 61 year old male in relatively good health who was admitted via [facility] on 02/11/22 with right sided weakness, right facial droop and slurred speech. CTH showed L-MCA hyperdense sign and he received TNK prior to transfer. CTA showed L-MCA MI occlusion and he underwent cerebral angio with thrombectomy and TICI 3 flow by Dr. Nelida Gores [name].  Follow up MRI brain done revealing small areas of acute infarct infarct in L-MCA territory with mild hemorrhagic transformation in left parietal lobe and L-MCA bifurcation residual vascular thrombus. He was taken back for thrombectomy with stent for dissection but had intra-stent occlusion which was unable to be recanalized. Post op CT showed small SAH which was stable on follow up. He was started on ASA/Brillinta. MBS 04/18 showed oral dysphagia with oral delay and no penetration or aspiration. He was started on D3, thins with supervision. Repeat MRI brain on 04/16 showed extensive evolving acute ischemic  L-MCA occlusion increased in size with associated edema, few additional small acute cortical subcortical infarcts involving contralateral right frontal lobe and left ACA distribution. Stroke felt to be due to unknown etiology and wife elected on Zio patch on discharge to rule out A fib. 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 [name] was d/c'd from [facility] on 03-19-22, and has had HHST since that time until last week.   PAIN:  Are you having pain? No  PATIENT GOALS Pt indicated he would like talking to improve  OBJECTIVE:  DIAGNOSTIC FINDINGS: MRI without contrast 02/13/22 IMPRESSION: 1. Extensive evolving acute ischemic left MCA distribution infarct, increased in size as compared to previous MRI from 02/11/2022. Associated edema without significant midline shift. 2. Small volume acute subarachnoid hemorrhage at the posterior aspect of the left Sylvian fissure, stable. 3. Few additional small volume acute ischemic cortical and subcortical infarcts involving the contralateral right frontal lobe and left ACA distribution as above. 4. Underlying chronic microvascular ischemic disease.    PATIENT REPORTED OUTCOME MEASURES (PROM): Communication Effectiveness Survey: provided 08-16-22 - to be returned next 1-2 sessions   TODAY'S TREATMENT:  08/16/22: SLP assessed pt's reading comprehension - pt reading single words 6/6 and simple sentences of 6-9 words ("the boy is riding his bike in the park") 6/6. [name] said pt having difficulty reading newspaper from home. SLP did reading task (newspaper) with pt today. SLP highlighted key words for pt and he answered 4/6 questions correct by pt answering yes/no and pointing to correct portion in the article to indicate place where the answer was found. Pt will return CES next session or two.  08/13/22: SLP targeted multimodal communication with pt today. SLP used written key word cues, multiple choice written responses, and internet pictures for multiple choice, and pt used drawing, gesturing, and Google Maps to communicate that he has two watercraft he uses to fish on a pond on his property and on [place], and catches multiple types of fish. SLP modeled/highlighted these options to pt's wife for them to use at home. Pt/wife have been practicing body parts at home for pain  management. SLP suggested pt might want to also practice clothing items and put body parts on hold, due to gestures usually accurate without verbal explanation for pain.  07-26-22: SLP reviewed the results of the evaluation and requested pt's wife [name] put written names by pt's photos he has been practicing with.   PATIENT EDUCATION: Education details: see "Today's therapy" Person educated: Patient and Spouse Education method: Explanation and Demonstration Education comprehension: verbalized understanding and needs further education   GOALS: Goals reviewed with patient? Yes, in general  SHORT TERM GOALS: Target date: 08/24/2022   Pt will name 6 salient family names when given picture and written name with consistent mod-max cues in 3 sessions (precursor for speech generating device) Baseline: Goal status: Ongoing  2.  Pt will produce simple phoneme/ bilabial+/a/  /a/, /ba/, /ma/, and/or /pa/ simultaneously with SLP 65% success (equal # reps/stimulus) in 2 sessions Baseline:  Goal status: Ongoing  3.  Pt will demo understanding of simple commands in context 80% over three sessions Baseline:  Goal status: Ongoing  4.  Pt will indicate understanding of a complex sentence 85% of the time in 3 sessions (precursor for speech generating device) Baseline:  Goal status: Modified  5.  Pt will copy written family names with 50% success in two sessions  Baseline:  Goal Status: Ongoing   LONG TERM GOALS: Target date: 10/24/2022  (or 20th visit)  Pt will name 6 salient family names when given picture and written name with consistent mod cues in 3 sessions (precursor for speech generating device) Baseline:  Goal status: Ongoing  2.  Pt will IMITATE simple phoneme/bilabial+/a/  /a/, /ba/, /ma/, and/or /pa/  65% success (equal # reps/stimulus) in 2 sessions Baseline:  Goal status: Ongoing  3.  Pt will generate meaningful message understood by [name] using speech generating device 60% of  the time in 3 sessions Baseline:  Goal status: Ongoing  4.  [name] will indicate that spontaneous communication is easier than during week of evaluation Baseline:  Goal status: Ongoing   ASSESSMENT:  CLINICAL IMPRESSION: Patient is a 61 y.o. male who was seen today for treatment of aphasia. 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 SLP modified reading goal. SEE TX NOTE from today for more details.  OBJECTIVE IMPAIRMENTS include expressive language, receptive language, and aphasia. These impairments are limiting patient from return to work, managing medications, managing appointments, household responsibilities, ADLs/IADLs, and effectively communicating at home and in community. Factors affecting potential to achieve goals and functional outcome are ability to learn/carryover information and severity of impairments. Patient will benefit from skilled SLP services to address above impairments and improve overall function.  REHAB POTENTIAL: Good  PLAN: SLP FREQUENCY: 2x/week  SLP DURATION: 12 weeks  PLANNED INTERVENTIONS: Language facilitation, Environmental controls, Cueing hierachy, Internal/external aids, Functional tasks, and Multimodal communication approach    Ja Ohman, CCC-SLP 08/16/2022, 1:28 PM

## 2022-08-16 NOTE — Therapy (Signed)
OUTPATIENT PHYSICAL THERAPY NEURO TREATMENT   Patient Name: Gordon Fisher MRN: [id number] DOB:12/22/1960, 61 y.o., male Today's Date: 08/16/2022   PCP: None listed REFERRING PROVIDER: Fanny Dance, MD    PT End of Session - 08/16/22 1301     Visit Number 4    Number of Visits 16    Date for PT Re-Evaluation 09/20/22    Authorization Type [employer] [id number]    Authorization - Visit Number 4    Authorization - Number of Visits 20    Progress Note Due on Visit 10    PT Start Time 1315    PT Stop Time 1400    PT Time Calculation (min) 45 min    Equipment Utilized During Treatment Gait belt    Activity Tolerance Patient tolerated treatment well    Behavior During Therapy WFL for tasks assessed/performed             Past Medical History:  Diagnosis Date   Stroke [facility])    Past Surgical History:  Procedure Laterality Date   BUBBLE STUDY  02/15/2022   Procedure: BUBBLE STUDY;  Surgeon: Christell Constant, MD;  Location: [facility];  Service: Cardiovascular;;   IR CT HEAD LTD  02/11/2022   IR CT HEAD LTD  02/11/2022   IR CT HEAD LTD  02/11/2022   IR PERCUTANEOUS ART THROMBECTOMY/INFUSION INTRACRANIAL INC DIAG ANGIO  02/11/2022   IR PERCUTANEOUS ART THROMBECTOMY/INFUSION INTRACRANIAL INC DIAG ANGIO  02/11/2022   IR US GUIDE VASC ACCESS RIGHT  02/11/2022   IR US GUIDE VASC ACCESS RIGHT  02/11/2022   RADIOLOGY WITH ANESTHESIA N/A 02/11/2022   Procedure: IR WITH ANESTHESIA;  Surgeon: Radiologist, Medication, MD;  Location: [facility] OR;  Service: Radiology;  Laterality: N/A;   RADIOLOGY WITH ANESTHESIA N/A 02/11/2022   Procedure: RADIOLOGY WITH ANESTHESIA;  Surgeon: Radiologist, Medication, MD;  Location: [facility] OR;  Service: Radiology;  Laterality: N/A;   TEE WITHOUT CARDIOVERSION N/A 02/15/2022   Procedure: TRANSESOPHAGEAL ECHOCARDIOGRAM (TEE);  Surgeon: Christell Constant, MD;  Location: [facility] ENDOSCOPY;  Service: Cardiovascular;  Laterality: N/A;   Patient Active Problem List    Diagnosis Date Noted   Hypocalcemia 03/22/2022   DVT, lower extremity, distal (HCC) 03/22/2022   Obesity 03/22/2022   Elevated LDL cholesterol level 03/22/2022   Acute ischemic left middle cerebral artery (MCA) stroke (HCC) 02/18/2022   Cerebrovascular accident (CVA) due to occlusion of left middle cerebral artery (HCC) 02/11/2022    ONSET DATE: April 2023  REFERRING DIAG: I63.9 (ICD-10-CM) - Cerebrovascular accident (CVA), unspecified mechanism (HCC) R13.10 (ICD-10-CM) - Dysphagia, unspecified type   THERAPY DIAG:  Difficulty in walking, not elsewhere classified  Muscle weakness (generalized)  Unsteadiness on feet  Other abnormalities of gait and mobility  Hemiplegia and hemiparesis following cerebral infarction affecting right dominant side (HCC)  Rationale for Evaluation and Treatment Rehabilitation  SUBJECTIVE:  SUBJECTIVE STATEMENT: Looking forward to orthotic assessment  Pt accompanied by: significant other  PERTINENT HISTORY: 02/11/2022 patient developed right-sided weakness, aphasia, went to hospital for evaluation.  Code stroke was activated.  He received TNK and then transferred to [facility].  He underwent cerebral angiogram with thrombectomy.  Unfortunately vessel reoccluded later the day and the second thrombectomy was attempted.  Left M2 dissection was identified and treated with stent but unfortunately stent demonstrated reocclusion   PAIN:  Are you having pain? No  PRECAUTIONS: Shoulder and Fall, Aphasia   WEIGHT BEARING RESTRICTIONS No  FALLS: Has patient fallen in last 6 months? Yes. Number of falls 1, required 3-assist to recover  LIVING ENVIRONMENT: Lives with: lives with their spouse Lives in: House/apartment Stairs: Yes: Internal: flight steps; bilateral  but cannot reach both and External: 4-5 steps; ramp is also present.  Has following equipment at home: Hemi walker, Wheelchair (manual), and sit-stand lift  PLOF: Independent  PATIENT GOALS regain independence  OBJECTIVE:   TODAY'S TREATMENT: 08/16/22 Activity Comments  Sit-stand stride stance 3x5 Therapist blocking to maintain foot position with RLE bias, LUE push-off required  Standing w/ LLE elevated on 12" step 6x30 sec for RLE forced weight bearing--attempts at no LUE support  RLE step-ups on stair case 4x10--reciprocal pattern Therapist facilitates RLE mechanics to promote LLE advancement   Gait training using PLS AFO RLE -treadmill for RLE using belt to take leg into terminal stance/pre-swing and cues for hip flexion/knee extension for limb advancement and RLE initial contact 3x60 sec -gait training to facilitate reciprocal steps and step-through, use of visual cues for left foot advancement and therapist facilitating RLE kinematics -continued with HW and SBA-CGA w/ improved swing phase using PLS AFO           TODAY'S TREATMENT: 08/13/22 Activity Comments  Sit-stand stride stance 3x5 Therapist blocking to maintain foot position with RLE bias, LUE push-off required  Standing on foam -3x10 LLE step descent, facilitation RLE -3x10 sec eyes closed--cues for right weight shift -head turns 5x EO/EC--facilitation RLE extension  Standing w/ LLE elevated on 12" step 3x30 sec for RLE forced weight bearing  RLE step-ups on stair case 4x10--reciprocal pattern Therapist facilitates RLE mechanics to promote LLE advancement   NU-step x 8 min Therapist facilitating right knee extension and hip adduction stability 50% of time        DIAGNOSTIC FINDINGS: Follow-up MRI brain done revealing small areas of acute infarct in left-MCA territory with mild hemorrhagic transformation in left parietal lobe and left-MCA bifurcation residual vascular thrombus   COGNITION: Overall cognitive status:   Difficult to assess due to aphasia   SENSATION: Light touch: Impaired  Proprioception: Impaired   COORDINATION: RLE impaired, some deficits on left foot with alternating movements and isolated movements  EDEMA:  None   MUSCLE TONE: RLE: Modifed Ashworth Scale 2 = More marked increase in muscle tone through most of the ROM, but affected part(s) easily moved right quad most affected, no clonus right plantarflexors   MUSCLE LENGTH: Able to achieve full right knee extension to PROM  DTRs:  Patella 3+ = Brisk  POSTURE: No Significant postural limitations  LOWER EXTREMITY ROM:     LLE WNL, RLE AROM limited by weakness  LOWER EXTREMITY MMT:    MMT Right Eval Left Eval  Hip flexion 3- 5  Hip extension    Hip abduction 3- 4  Hip adduction 3- 5  Hip internal rotation    Hip external rotation  Knee flexion 3 5  Knee extension 2+ 5  Ankle dorsiflexion 0 5  Ankle plantarflexion 1 3+  Ankle inversion    Ankle eversion    (Blank rows = not tested)  BED MOBILITY:  NT  TRANSFERS: Assistive device utilized: Hemi walker  Sit to stand: Modified independence Stand to sit: SBA Chair to chair: SBA and CGA Floor:  Total assist  RAMP:  Level of Assistance: CGA Assistive device utilized: Hemi walker Ramp Comments:   CURB:  Level of Assistance: Min A Assistive device utilized: Education officer, environmental Comments: cues in sequence  STAIRS:  Level of Assistance: CGA and Min A  Stair Negotiation Technique: Step to Pattern with Single Rail on Left  Number of Stairs: 10   Height of Stairs: 4-6"  Comments: cues and guarding  GAIT: Gait pattern: step to pattern, decreased ankle dorsiflexion- Right, and circumduction- Right Distance walked: 82 Assistive device utilized: Hemi walker Level of assistance: CGA Comments: deficits during turning  FUNCTIONAL TESTs:  5 times sit to stand: 11.59 sec with 3 retro LOB and poor eccentric control Timed up and go (TUG): 34.75 sec Berg  Balance Scale: 32/56 Dynamic Gait Index: NT 2 minute walk test: 82 ft w/ hemi-walker, 0.68 ft/sec  M-CTSIB  Condition 1: Firm Surface, EO 30 Sec, Normal and Mild Sway  Condition 2: Firm Surface, EC 30 Sec, Mild and Moderate Sway  Condition 3: Foam Surface, EO 30 Sec, Mild Sway  Condition 4: Foam Surface, EC 26 Sec, Moderate and Severe Sway     PATIENT SURVEYS:  FOTO 47%  TODAY'S TREATMENT:  assessment   PATIENT EDUCATION: Education details: assessment findings, PT scope of practice Person educated: Patient and Spouse Education method: Explanation Education comprehension: verbalized understanding   HOME EXERCISE PROGRAM: TBD    GOALS: Goals reviewed with patient? Yes  SHORT TERM GOALS: Target date: 08/23/2022  Patient will perform HEP with family/caregiver supervision for improved strength, balance, transfers, and gait  Baseline: Goal status: On-going  2.  Ambulate 12 stairs with left HR and supervision to improve safety within home Baseline: CGA-min A Goal status: on-going  3.  Demonstrate improved safety and efficiency with ambulation per distance of 100 ft during Baseline: 82 ft w/ HW and CGA Goal status: on-going    LONG TERM GOALS: Target date: 09/20/2022  Demonstrate improved balance and reduced risk for falls per score 40/56 Berg Balance Test Baseline: 32/56 Goal status: INITIAL  2.  Demonstrate modified independent ambulation x 150 ft level surfaces using least restrictive AD in order to improve safety with home environment Baseline: CGA w/ HW Goal status: INITIAL  3.  Manifest improved safety with gait per time of 18 sec TUG test using least restrictive AD Baseline: 34.75 sec w/ HW Goal status: INITIAL    ASSESSMENT:  CLINICAL IMPRESSION: Improving swing phase mechanics with AFO vs without minimizing circumduction and enhancing tendency for heel strike as well as improved LLE step through appreciated. Demo tendency for fatigue with  continuous steps as demanded by treadmill. Able to enact closed chain right knee extension during forced weight bearing so long as LUE was able to hold rail/AD. Continued sessions to progress motor control, coordination, balance, and strength to facilitate independent and normalized gait pattern   OBJECTIVE IMPAIRMENTS Abnormal gait, decreased activity tolerance, decreased balance, decreased coordination, decreased knowledge of use of DME, decreased mobility, difficulty walking, decreased strength, impaired sensation, impaired tone, impaired UE functional use, and improper body mechanics.   ACTIVITY LIMITATIONS carrying,  lifting, bending, standing, squatting, stairs, transfers, bathing, toileting, dressing, reach over head, and locomotion level  PARTICIPATION LIMITATIONS: meal prep, cleaning, laundry, interpersonal relationship, driving, shopping, community activity, occupation, yard work, and leisure activities  [place] Time since onset of injury/illness/exacerbation are also affecting patient's functional outcome.   REHAB POTENTIAL: Excellent  CLINICAL DECISION MAKING: Evolving/moderate complexity  EVALUATION COMPLEXITY: Moderate  PLAN: PT FREQUENCY: 2x/week  PT DURATION: 8 weeks  PLANNED INTERVENTIONS: Therapeutic exercises, Therapeutic activity, Neuromuscular re-education, Balance training, Gait training, Patient/Family education, Self Care, Joint mobilization, Stair training, Vestibular training, Canalith repositioning, Orthotic/Fit training, DME instructions, Aquatic Therapy, Dry Needling, Electrical stimulation, Spinal mobilization, Cryotherapy, Moist heat, Splintting, Taping, and Manual therapy  PLAN FOR NEXT SESSION: ,stride stance sit-stand review, forced weightbearing, quadruped?   1:01 PM, 08/16/22 M. Sherlyn Lees, PT, DPT Physical Therapist- [place] Office Number: [phone]

## 2022-08-16 NOTE — Therapy (Signed)
OUTPATIENT OCCUPATIONAL THERAPY NEURO  Treatment Note  Patient Name: Gordon Fisher MRN: [id number] DOB:[birth year], 61 y.o., male Today's Date: 08/16/2022  PCP: none on file REFERRING PROVIDER: Jennye Boroughs, MD    OT End of Session - 08/16/22 1406     Visit Number 4    Number of Visits 17    Date for OT Re-Evaluation 09/24/22    Authorization Type [place] - Visit Number 4    Authorization - Number of Visits 20    OT Start Time 6644    OT Stop Time 1445    OT Time Calculation (min) 42 min    Activity Tolerance Patient tolerated treatment well    Behavior During Therapy WFL for tasks assessed/performed                Past Medical History:  Diagnosis Date   Stroke [name])    Past Surgical History:  Procedure Laterality Date   BUBBLE STUDY  02/15/2022   Procedure: BUBBLE STUDY;  Surgeon: Werner Lean, MD;  Location: [place];  Service: Cardiovascular;;   IR CT HEAD LTD  02/11/2022   IR CT HEAD LTD  02/11/2022   IR CT HEAD LTD  02/11/2022   IR PERCUTANEOUS ART THROMBECTOMY/INFUSION INTRACRANIAL INC DIAG ANGIO  02/11/2022   IR PERCUTANEOUS ART THROMBECTOMY/INFUSION INTRACRANIAL INC DIAG ANGIO  02/11/2022   IR US GUIDE VASC ACCESS RIGHT  02/11/2022   IR US GUIDE VASC ACCESS RIGHT  02/11/2022   RADIOLOGY WITH ANESTHESIA N/A 02/11/2022   Procedure: IR WITH ANESTHESIA;  Surgeon: Radiologist, Medication, MD;  Location: [place];  Service: Radiology;  Laterality: N/A;   RADIOLOGY WITH ANESTHESIA N/A 02/11/2022   Procedure: RADIOLOGY WITH ANESTHESIA;  Surgeon: Radiologist, Medication, MD;  Location: [place];  Service: Radiology;  Laterality: N/A;   TEE WITHOUT CARDIOVERSION N/A 02/15/2022   Procedure: TRANSESOPHAGEAL ECHOCARDIOGRAM (TEE);  Surgeon: Werner Lean, MD;  Location: [facility] ENDOSCOPY;  Service: Cardiovascular;  Laterality: N/A;   Patient Active Problem List   Diagnosis Date Noted   Hypocalcemia 03/22/2022   DVT, lower  extremity, distal ([place]) 03/22/2022   Obesity 03/22/2022   Elevated LDL cholesterol level 03/22/2022   Acute ischemic left middle cerebral artery (MCA) stroke ([place]) 02/18/2022   Cerebrovascular accident (CVA) due to occlusion of left middle cerebral artery ([place]) 02/11/2022    ONSET DATE: referral 07/15/22 (CVA 02/11/22)  REFERRING DIAG: I63.9 (ICD-10-CM) - Cerebrovascular accident (CVA), unspecified mechanism (Gordon Fisher) R13.10 (ICD-10-CM) - Dysphagia, unspecified type   THERAPY DIAG:  Hemiplegia and hemiparesis following cerebral infarction affecting right dominant side (HCC)  Other lack of coordination  Other disturbances of skin sensation  Muscle weakness (generalized)  Rationale for Evaluation and Treatment Rehabilitation  SUBJECTIVE:   SUBJECTIVE STATEMENT: Pt's spouse reports that sometimes her sister works with pt on table top slides. Pt accompanied by: family member (wife, Gordon Fisher)  PERTINENT HISTORY: in relatively good health prior to CVA  PRECAUTIONS: Gordon Fisher No  PAIN:  Are you having pain? No  FALLS: Has patient fallen in last 6 months? Yes. Number of falls 1  PLOF: Independent, Independent with basic ADLs, and Independent with gait  PATIENT GOALS continue to make gains, independence with getting dressed  OBJECTIVE:   HAND DOMINANCE: Right  FUNCTIONAL OUTCOME MEASURES: FOTO: 11  UPPER EXTREMITY ROM    AROM: Pt with no active ROM during assessment.  Pt was able to elicit min shoulder elevation and slight gross grasp when  cued and provided increased time to initiate movement.    Passive ROM Right eval Left eval  Shoulder flexion 100   Shoulder abduction    Shoulder adduction    Shoulder extension    Shoulder internal rotation WNL   Shoulder external rotation Onset of pain >5-10* from neutral   Middle trapezius    Lower trapezius    Elbow flexion WNL   Elbow extension WNL   Wrist flexion WNL   Wrist extension WNL   Wrist ulnar  deviation    Wrist radial deviation    Wrist pronation    Wrist supination    (Blank rows = not tested)  HAND FUNCTION: No functional grasp.  Trace gross grasp with increased time for initiation  SENSATION: Difficult to assess due to expressive aphasia  MUSCLE TONE: RUE: Mild and Hypertonic  ---------------------------------------------------------------------------------------------------------------------------------------------------------------------- (objective measures above completed at initial evaluation unless otherwise dated)  TODAY'S TREATMENT:  08/16/22 WB through elbow: NMR with WB through R elbow on box to facilitate increased WB and stabilization through shoulder while engaging in dynamic reaching with LUE. OT providing tactile cues at R arm to maintain positioning and to provide intermittent external feedback for WB and weight shifting.   OT moving target to further facilitate reach and ultimately WB through RUE. Educated on importance of WB for NMR. UE Ranger: engaged in shoulder flexion/extension and horizontal abduction/adduction with UE Ranger.  Pt initially with difficulty with flexion and abduction however with repetition and mod cues for visual attention and tactile cues to minimize compensatory trunk movements. Pt demonstrating improvements in activation with tactile cue at elbow to minimize effects of gravity.   Shoulder ROM: shoulder flexion/extension and abduction/adduction with ball on leg to facilitate increased activation and motor control.  OT providing guarding and intermittent tactile cues at elbow, however pt demonstrating improved motor control with min-mod trunk incorporation and no dropping of ball or hand off ball.    08/13/22 Table slides: OT facilitating shoulder flexion, elbow extension, and horizontal abduction/adduction while performing towel slides on table.  Pt demonstrating increased finger flexion, therefore placed roll of foam in hand to  decrease interference with task.  Pt initially requiring hand over hand fading to min tactile cue at elbow to minimize effects of gravity on movement allowing for increased ROM in all directions.  OT providing targets to further facilitate change of directions during towel slides. Self-ROM: Reiterated self-ROM exercises, previously administered in IP Rehab, with additional instructions for improved technique and provided with updated handout Trialed GivMohr sling: OT applied GivMohr sling to RUE with education on proper donning technique and fit of sling.  Pt ambulated 20' while wearing sling, but shaking head no and gesturing to hand when asking if he liked sling/would wear it.  May benefit from additional trial during therapy sessions to allow for more natural arm swing and support at hand prior to purchase if recommended. Splint wear: discussed resting hand splint (from hospital) for night time wear as pt demonstrating increased tone in hand and fingers this date.  Discussed importance of stretching hand and hygiene in hand if maintained in flexed fist.     08/04/22 Supine AAROM: OT facilitating shoulder abduction/adduction and internal rotation in supine.  OT providing facilitation for improved positioning and intermittent tactile cues to facilitate external rotation.  Pt able to demonstrate adduction from 90* to near neutral and abduction to ~30* with therapist support under elbow at at wrist for improved alignment.  OT facilitating elbow flexion/extension in supine  at neutral with pt able to complete elbow extension to neutral and elbow extension and flexion when positioned with shoulder at 90* flexion to minimize effects of gravity on elbow flexion.  Pt able to extend against gravity in this position.   Provided hand over hand facilitation for wife and demonstration of hand placement and facilitation during activity.  Provided with written handout of instructions for hand placement and exercise  technique.   PATIENT EDUCATION: Education details: Educated on [employer] exercises, importance of WB through UE for NMR, and progression of towel slides. Person educated: Patient and Spouse Education method: Explanation, Demonstration, Tactile cues, Verbal cues, and Handouts Education comprehension: verbalized understanding and needs further education   HOME EXERCISE PROGRAM: Written exercises of shoulder and elbow in supine with cues for hand placement and facilitation.    GOALS: Goals reviewed with patient? Yes  SHORT TERM GOALS: Target date: 08/27/22  Pt and spouse will be independent with PROM and self-ROM HEP for shoulder stability and ROM. Baseline: Goal status: IN PROGRESS  2.  Pt will verbalize understanding of task modifications and/or potential A/E needs to increase ease, safety, and independence w/ ADLs Baseline:  Goal status: IN PROGRESS  3.  Pt will verbalize understanding of strategies and use of RUE as stabilizer during ADLs. Baseline:  Goal status: IN PROGRESS   LONG TERM GOALS: Target date: 09/24/22  Pt will demonstrate ability to complete UB dressing with supervision with use of hemi-dressing technique. Baseline:  Goal status: IN PROGRESS  2.  Pt will demonstrate improved awareness of and positioning of RUE during mobility and self-care tasks. Baseline:  Goal status: IN PROGRESS  3.  Pt will utilize RUE as gross assist during grooming tasks (such as washing hands and applying toothpaste). Baseline:  Goal status: IN PROGRESS  4.  Pt will verbalize and or demonstrate increased functional use of BUE during LB dressing to complete at supervision level. Baseline:  Goal status: IN PROGRESS  5.  Pt will verbalize improved functional incorporation of RUE with ADLs by reporting improved FOTO score to >/= to 20. Baseline: 11 Goal status: IN PROGRESS    ASSESSMENT:  CLINICAL IMPRESSION: Treatment session with focus on shoulder flexion/extension and  horizontal abduction/adduction after WB through RUE. Pt able to participate in movements with min-mod facilitation of movements and intermittent tactile cues and support to minimize compensatory trunk movements/strategies.  OT providing cues for breathing as pt initially holding breath and activating whole body to initiate movement.   PERFORMANCE DEFICITS in functional skills including ADLs, IADLs, coordination, sensation, tone, ROM, strength, pain, flexibility, FMC, GMC, mobility, balance, body mechanics, vision, and UE functional use and psychosocial skills including environmental adaptation and routines and behaviors.   IMPAIRMENTS are limiting patient from ADLs and IADLs.   COMORBIDITIES may have co-morbidities  that affects occupational performance. Patient will benefit from skilled OT to address above impairments and improve overall function.  MODIFICATION OR ASSISTANCE TO COMPLETE EVALUATION: Min-Moderate modification of tasks or assist with assess necessary to complete an evaluation.  OT OCCUPATIONAL PROFILE AND HISTORY: Detailed assessment: Review of records and additional review of physical, cognitive, psychosocial history related to current functional performance.  CLINICAL DECISION MAKING: Moderate - several treatment options, min-mod task modification necessary  REHAB POTENTIAL: Good  EVALUATION COMPLEXITY: Moderate    PLAN: OT FREQUENCY: 2x/week  OT DURATION: 8 weeks  PLANNED INTERVENTIONS: self care/ADL training, therapeutic exercise, therapeutic activity, neuromuscular re-education, manual therapy, passive range of motion, balance training, functional mobility training, splinting, electrical  stimulation, compression bandaging, moist heat, cryotherapy, patient/family education, visual/perceptual remediation/compensation, psychosocial skills training, energy conservation, coping strategies training, and DME and/or AE instructions  RECOMMENDED OTHER SERVICES: N/A  CONSULTED  AND AGREED WITH PLAN OF CARE: Patient and family member/caregiver  PLAN FOR NEXT SESSION: Review PROM and self-ROM HEP, educate on use of RUE as stabilizer during ADLs/functional tasks.   Rosalio Loud, OTR/L 08/16/2022, 3:03 PM

## 2022-08-17 NOTE — Therapy (Signed)
OUTPATIENT PHYSICAL THERAPY NEURO TREATMENT   Patient Name: Gordon Fisher MRN: [id number] DOB:October 30, 1961, 61 y.o., male Today's Date: 08/18/2022   PCP: None listed REFERRING PROVIDER: Jennye Boroughs, MD    PT End of Session - 08/18/22 1312     Visit Number 5    Number of Visits 16    Date for PT Re-Evaluation 09/20/22    Authorization Type [employer] [id number]    Authorization - Visit Number 5    Authorization - Number of Visits 20    Progress Note Due on Visit 10    PT Start Time 1230    PT Stop Time 1312    PT Time Calculation (min) 42 min    Equipment Utilized During Treatment Gait belt    Activity Tolerance Patient tolerated treatment well    Behavior During Therapy WFL for tasks assessed/performed              Past Medical History:  Diagnosis Date   Stroke [facility])    Past Surgical History:  Procedure Laterality Date   BUBBLE STUDY  02/15/2022   Procedure: BUBBLE STUDY;  Surgeon: Werner Lean, MD;  Location: [place];  Service: Cardiovascular;;   IR CT HEAD LTD  02/11/2022   IR CT HEAD LTD  02/11/2022   IR CT HEAD LTD  02/11/2022   IR PERCUTANEOUS ART THROMBECTOMY/INFUSION INTRACRANIAL INC DIAG ANGIO  02/11/2022   IR PERCUTANEOUS ART THROMBECTOMY/INFUSION INTRACRANIAL INC DIAG ANGIO  02/11/2022   IR US GUIDE VASC ACCESS RIGHT  02/11/2022   IR US GUIDE VASC ACCESS RIGHT  02/11/2022   RADIOLOGY WITH ANESTHESIA N/A 02/11/2022   Procedure: IR WITH ANESTHESIA;  Surgeon: Radiologist, Medication, MD;  Location: [place];  Service: Radiology;  Laterality: N/A;   RADIOLOGY WITH ANESTHESIA N/A 02/11/2022   Procedure: RADIOLOGY WITH ANESTHESIA;  Surgeon: Radiologist, Medication, MD;  Location: [place];  Service: Radiology;  Laterality: N/A;   TEE WITHOUT CARDIOVERSION N/A 02/15/2022   Procedure: TRANSESOPHAGEAL ECHOCARDIOGRAM (TEE);  Surgeon: Werner Lean, MD;  Location: [facility] ENDOSCOPY;  Service: Cardiovascular;  Laterality: N/A;   Patient Active Problem List    Diagnosis Date Noted   Hypocalcemia 03/22/2022   DVT, lower extremity, distal ([place]) 03/22/2022   Obesity 03/22/2022   Elevated LDL cholesterol level 03/22/2022   Acute ischemic left middle cerebral artery (MCA) stroke ([place]) 02/18/2022   Cerebrovascular accident (CVA) due to occlusion of left middle cerebral artery ([place]) 02/11/2022    ONSET DATE: April 2023  REFERRING DIAG: I63.9 (ICD-10-CM) - Cerebrovascular accident (CVA), unspecified mechanism ([place]) R13.10 (ICD-10-CM) - Dysphagia, unspecified type   THERAPY DIAG:  Muscle weakness (generalized)  Difficulty in walking, not elsewhere classified  Unsteadiness on feet  Other abnormalities of gait and mobility  Rationale for Evaluation and Treatment Rehabilitation  SUBJECTIVE:  SUBJECTIVE STATEMENT: Doing okay. No recent changes/concerns. Getting fitted for an AFO later this month.  Pt accompanied by: significant other  PERTINENT HISTORY: 02/11/2022 patient developed right-sided weakness, aphasia, went to hospital for evaluation.  Code stroke was activated.  He received TNK and then transferred to [facility].  He underwent cerebral angiogram with thrombectomy.  Unfortunately vessel reoccluded later the day and the second thrombectomy was attempted.  Left M2 dissection was identified and treated with stent but unfortunately stent demonstrated reocclusion   PAIN:  Are you having pain? No  PRECAUTIONS: Shoulder and Fall, Aphasia   WEIGHT BEARING RESTRICTIONS No  FALLS: Has patient fallen in last 6 months? Yes. Number of falls 1, required 3-assist to recover  LIVING ENVIRONMENT: Lives with: lives with their spouse Lives in: House/apartment Stairs: Yes: Internal: flight steps; bilateral but cannot reach both and External: 4-5 steps; ramp  is also present.  Has following equipment at home: Hemi walker, Wheelchair (manual), and sit-stand lift  PLOF: Independent  PATIENT GOALS regain independence  OBJECTIVE:      TODAY'S TREATMENT: 08/18/22 Activity Comments  stride-stance sit-stand 2x5 EO/EC Good stability; encouraged less L UE support  lateral wt shift Difficulty performing weight shift with hip- used mirror feedback and heavy verbal/manual/demo cueing with improved understanding   ant/pos wt shift Difficulty understanding- best response to cueing to shift onto toes/heels like a heel/toe raise; mirror feedback  R wt shift + L forward step  mirror feedback; using hemi walker or quad tip cane   R wt shift + L forward step on 4" step  mirror feedback; using hemi walker or quad tip cane   alt toe tap on step   Marching with hemi walker Limited R knee flexion- provided assist   mini squat Hemi walker support; manual cues at R hip to improve wt shift   R LE step ups on 4" step L UE on handrail; initially blocking R knee upon step up but patien did not need it, instead required assistance at hip to lift R knee up/down off step             HOME EXERCISE PROGRAM Last updated: 08/18/22 Access Code: [email or web address] URL: [email or web address] Date: 08/18/2022 Prepared by: [facility] - Outpatient  Rehab - [facility]  Exercises - Forward Backward Weight Shift with Counter Support  - 1 x daily - 5 x weekly - 2 sets - 10 reps - Side to Side Weight Shift with Counter Support  - 1 x daily - 5 x weekly - 2 sets - 10 reps - Mini Squat with Counter Support  - 1 x daily - 5 x weekly - 2 sets - 10 reps - Stride Stance Weight Shift  - 1 x daily - 5 x weekly - 2 sets - 10 reps   PATIENT EDUCATION: Education details: initiated HEP and encouraged use of floor length mirror in the home for improved body awareness  Person educated: Patient and Spouse Education method: Explanation, Demonstration, Tactile cues, Verbal cues,  and Handouts Education comprehension: verbalized understanding and returned demonstration   Below measures were taken at time of initial evaluation unless otherwise specified:     DIAGNOSTIC FINDINGS: Follow-up MRI brain done revealing small areas of acute infarct in left-MCA territory with mild hemorrhagic transformation in left parietal lobe and left-MCA bifurcation residual vascular thrombus   COGNITION: Overall cognitive status:  Difficult to assess due to aphasia   SENSATION: Light touch: Impaired  Proprioception: Impaired   COORDINATION: RLE  impaired, some deficits on left foot with alternating movements and isolated movements  EDEMA:  None   MUSCLE TONE: RLE: Modifed Ashworth Scale 2 = More marked increase in muscle tone through most of the ROM, but affected part(s) easily moved right quad most affected, no clonus right plantarflexors   MUSCLE LENGTH: Able to achieve full right knee extension to PROM  DTRs:  Patella 3+ = Brisk  POSTURE: No Significant postural limitations  LOWER EXTREMITY ROM:     LLE WNL, RLE AROM limited by weakness  LOWER EXTREMITY MMT:    MMT Right Eval Left Eval  Hip flexion 3- 5  Hip extension    Hip abduction 3- 4  Hip adduction 3- 5  Hip internal rotation    Hip external rotation    Knee flexion 3 5  Knee extension 2+ 5  Ankle dorsiflexion 0 5  Ankle plantarflexion 1 3+  Ankle inversion    Ankle eversion    (Blank rows = not tested)  BED MOBILITY:  NT  TRANSFERS: Assistive device utilized: Hemi walker  Sit to stand: Modified independence Stand to sit: SBA Chair to chair: SBA and CGA Floor:  Total assist  RAMP:  Level of Assistance: CGA Assistive device utilized: Hemi walker Ramp Comments:   CURB:  Level of Assistance: Min A Assistive device utilized: Education officer, environmental Comments: cues in sequence  STAIRS:  Level of Assistance: CGA and Min A  Stair Negotiation Technique: Step to Pattern with Single Rail on  Left  Number of Stairs: 10   Height of Stairs: 4-6"  Comments: cues and guarding  GAIT: Gait pattern: step to pattern, decreased ankle dorsiflexion- Right, and circumduction- Right Distance walked: 82 Assistive device utilized: Hemi walker Level of assistance: CGA Comments: deficits during turning  FUNCTIONAL TESTs:  5 times sit to stand: 11.59 sec with 3 retro LOB and poor eccentric control Timed up and go (TUG): 34.75 sec Berg Balance Scale: 32/56 Dynamic Gait Index: NT 2 minute walk test: 82 ft w/ hemi-walker, 0.68 ft/sec  M-CTSIB  Condition 1: Firm Surface, EO 30 Sec, Normal and Mild Sway  Condition 2: Firm Surface, EC 30 Sec, Mild and Moderate Sway  Condition 3: Foam Surface, EO 30 Sec, Mild Sway  Condition 4: Foam Surface, EC 26 Sec, Moderate and Severe Sway     PATIENT SURVEYS:  FOTO 47%  TODAY'S TREATMENT:  assessment   PATIENT EDUCATION: Education details: assessment findings, PT scope of practice Person educated: Patient and Spouse Education method: Explanation Education comprehension: verbalized understanding   HOME EXERCISE PROGRAM: TBD    GOALS: Goals reviewed with patient? Yes  SHORT TERM GOALS: Target date: 08/23/2022  Patient will perform HEP with family/caregiver supervision for improved strength, balance, transfers, and gait  Baseline: Goal status: On-going  2.  Ambulate 12 stairs with left HR and supervision to improve safety within home Baseline: CGA-min A Goal status: on-going  3.  Demonstrate improved safety and efficiency with ambulation per distance of 100 ft during Baseline: 82 ft w/ HW and CGA Goal status: on-going    LONG TERM GOALS: Target date: 09/20/2022  Demonstrate improved balance and reduced risk for falls per score 40/56 Berg Balance Test Baseline: 32/56 Goal status: INITIAL  2.  Demonstrate modified independent ambulation x 150 ft level surfaces using least restrictive AD in order to improve safety with  home environment Baseline: CGA w/ HW Goal status: INITIAL  3.  Manifest improved safety with gait per time of 18 sec  TUG test using least restrictive AD Baseline: 34.75 sec w/ HW Goal status: INITIAL    ASSESSMENT:  CLINICAL IMPRESSION: Patient arrived to session without complaints. Worked heavily on encouraging R weight shift during pre-gait training today. Patient responded well to manual cues and mirror feedback. R hip and ankle appear to limit stepping with the R LE, however knee control is fairly stable. Patient reported understanding of all edu provided and without complaints at end of session.     OBJECTIVE IMPAIRMENTS Abnormal gait, decreased activity tolerance, decreased balance, decreased coordination, decreased knowledge of use of DME, decreased mobility, difficulty walking, decreased strength, impaired sensation, impaired tone, impaired UE functional use, and improper body mechanics.   ACTIVITY LIMITATIONS carrying, lifting, bending, standing, squatting, stairs, transfers, bathing, toileting, dressing, reach over head, and locomotion level  PARTICIPATION LIMITATIONS: meal prep, cleaning, laundry, interpersonal relationship, driving, shopping, community activity, occupation, yard work, and leisure activities  PERSONAL FACTORS Time since onset of injury/illness/exacerbation are also affecting patient's functional outcome.   REHAB POTENTIAL: Excellent  CLINICAL DECISION MAKING: Evolving/moderate complexity  EVALUATION COMPLEXITY: Moderate  PLAN: PT FREQUENCY: 2x/week  PT DURATION: 8 weeks  PLANNED INTERVENTIONS: Therapeutic exercises, Therapeutic activity, Neuromuscular re-education, Balance training, Gait training, Patient/Family education, Self Care, Joint mobilization, Stair training, Vestibular training, Canalith repositioning, Orthotic/Fit training, DME instructions, Aquatic Therapy, Dry Needling, Electrical stimulation, Spinal mobilization, Cryotherapy, Moist heat,  Splintting, Taping, and Manual therapy  PLAN FOR NEXT SESSION: use of mirror for R wt shift,stride stance sit-stand review, forced weightbearing, quadruped?   Anette Guarneri, PT, DPT 08/18/22 1:15 PM  [facility] [address] [place], [address] [place], [place] [address] Phone # [phone] Fax # [phone]

## 2022-08-18 ENCOUNTER — Encounter: Payer: Self-pay | Admitting: Physical Therapy

## 2022-08-18 ENCOUNTER — Ambulatory Visit: Payer: 59 | Admitting: Physical Therapy

## 2022-08-18 ENCOUNTER — Ambulatory Visit: Payer: 59

## 2022-08-18 ENCOUNTER — Ambulatory Visit: Payer: 59 | Admitting: Occupational Therapy

## 2022-08-18 ENCOUNTER — Encounter: Payer: Self-pay | Admitting: Occupational Therapy

## 2022-08-18 DIAGNOSIS — R482 Apraxia: Secondary | ICD-10-CM

## 2022-08-18 DIAGNOSIS — M6281 Muscle weakness (generalized): Secondary | ICD-10-CM

## 2022-08-18 DIAGNOSIS — I69351 Hemiplegia and hemiparesis following cerebral infarction affecting right dominant side: Secondary | ICD-10-CM | POA: Diagnosis not present

## 2022-08-18 DIAGNOSIS — R262 Difficulty in walking, not elsewhere classified: Secondary | ICD-10-CM

## 2022-08-18 DIAGNOSIS — R208 Other disturbances of skin sensation: Secondary | ICD-10-CM

## 2022-08-18 DIAGNOSIS — R2681 Unsteadiness on feet: Secondary | ICD-10-CM

## 2022-08-18 DIAGNOSIS — R4701 Aphasia: Secondary | ICD-10-CM

## 2022-08-18 DIAGNOSIS — R2689 Other abnormalities of gait and mobility: Secondary | ICD-10-CM

## 2022-08-18 DIAGNOSIS — R278 Other lack of coordination: Secondary | ICD-10-CM

## 2022-08-18 NOTE — Therapy (Incomplete)
OUTPATIENT SPEECH LANGUAGE PATHOLOGY TREATMENT   Patient Name: Gordon Fisher MRN: [id number] DOB:Oct 10, 1961, 61 y.o., male Today's Date: 08/18/2022  PCP: TBD REFERRING PROVIDER: Fanny Dance, MD    End of Session - 08/18/22 1334     Visit Number 4    Number of Visits 25    Date for SLP Re-Evaluation 10/24/22    Authorization - Number of Visits 20    Progress Note Due on Visit 4    SLP Start Time 1321    SLP Stop Time  1401    SLP Time Calculation (min) 40 min    Activity Tolerance Patient tolerated treatment well               Past Medical History:  Diagnosis Date   Stroke [facility])    Past Surgical History:  Procedure Laterality Date   BUBBLE STUDY  02/15/2022   Procedure: BUBBLE STUDY;  Surgeon: Christell Constant, MD;  Location: [facility] ENDOSCOPY;  Service: Cardiovascular;;   IR CT HEAD LTD  02/11/2022   IR CT HEAD LTD  02/11/2022   IR CT HEAD LTD  02/11/2022   IR PERCUTANEOUS ART THROMBECTOMY/INFUSION INTRACRANIAL INC DIAG ANGIO  02/11/2022   IR PERCUTANEOUS ART THROMBECTOMY/INFUSION INTRACRANIAL INC DIAG ANGIO  02/11/2022   IR US GUIDE VASC ACCESS RIGHT  02/11/2022   IR US GUIDE VASC ACCESS RIGHT  02/11/2022   RADIOLOGY WITH ANESTHESIA N/A 02/11/2022   Procedure: IR WITH ANESTHESIA;  Surgeon: Radiologist, Medication, MD;  Location: [facility] OR;  Service: Radiology;  Laterality: N/A;   RADIOLOGY WITH ANESTHESIA N/A 02/11/2022   Procedure: RADIOLOGY WITH ANESTHESIA;  Surgeon: Radiologist, Medication, MD;  Location: [facility] OR;  Service: Radiology;  Laterality: N/A;   TEE WITHOUT CARDIOVERSION N/A 02/15/2022   Procedure: TRANSESOPHAGEAL ECHOCARDIOGRAM (TEE);  Surgeon: Christell Constant, MD;  Location: [facility] ENDOSCOPY;  Service: Cardiovascular;  Laterality: N/A;   Patient Active Problem List   Diagnosis Date Noted   Hypocalcemia 03/22/2022   DVT, lower extremity, distal ([facility]) 03/22/2022   Obesity 03/22/2022   Elevated LDL cholesterol level 03/22/2022   Acute ischemic left  middle cerebral artery (MCA) stroke ([facility]) 02/18/2022   Cerebrovascular accident (CVA) due to occlusion of left middle cerebral artery ([facility]) 02/11/2022    ONSET DATE: 02-11-22   REFERRING DIAG: I63.9 (ICD-10-CM) - Cerebrovascular accident (CVA), unspecified mechanism ([facility]) R13.10 (ICD-10-CM) - Dysphagia, unspecified type   THERAPY DIAG:  Aphasia  Verbal apraxia  Rationale for Evaluation and Treatment Rehabilitation  SUBJECTIVE:   SUBJECTIVE STATEMENT: "Yeah." Pt accompanied by: significant other  PERTINENT HISTORY: Gordon Fisher is a 61 year old male in relatively good health who was admitted via [facility] on 02/11/22 with right sided weakness, right facial droop and slurred speech. CTH showed L-MCA hyperdense sign and he received TNK prior to transfer. CTA showed L-MCA MI occlusion and he underwent cerebral angio with thrombectomy and TICI 3 flow by Dr. Lonzo Candy Sherlon Handing.  Follow up MRI brain done revealing small areas of acute infarct infarct in L-MCA territory with mild hemorrhagic transformation in left parietal lobe and L-MCA bifurcation residual vascular thrombus. He was taken back for thrombectomy with stent for dissection but had intra-stent occlusion which was unable to be recanalized. Post op CT showed small SAH which was stable on follow up. He was started on ASA/Brillinta. MBS 04/18 showed oral dysphagia with oral delay and no penetration or aspiration. He was started on D3, thins with supervision. Repeat MRI brain on 04/16 showed extensive evolving acute ischemic  L-MCA occlusion increased in size with associated edema, few additional small acute cortical subcortical infarcts involving contralateral right frontal lobe and left ACA distribution. Stroke felt to be due to unknown etiology and wife elected on Zio patch on discharge to rule out A fib. 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 [name] was d/c'd from [facility] on 03-19-22, and has had HHST since that time until last week.   PAIN:  Are you having pain? No  PATIENT GOALS Pt indicated he would like talking to improve  OBJECTIVE:  DIAGNOSTIC FINDINGS: MRI without contrast 02/13/22 IMPRESSION: 1. Extensive evolving acute ischemic left MCA distribution infarct, increased in size as compared to previous MRI from 02/11/2022. Associated edema without significant midline shift. 2. Small volume acute subarachnoid hemorrhage at the posterior aspect of the left Sylvian fissure, stable. 3. Few additional small volume acute ischemic cortical and subcortical infarcts involving the contralateral right frontal lobe and left ACA distribution as above. 4. Underlying chronic microvascular ischemic disease.    PATIENT REPORTED OUTCOME MEASURES (PROM): Communication Effectiveness Survey: provided 08-16-22 - to be returned next 1-2 sessions   TODAY'S TREATMENT:  08/18/22:   08/16/22: SLP assessed pt's reading comprehension - pt reading single words 6/6 and simple sentences of 6-9 words ("the boy is riding his bike in the park") 6/6. [name] said pt having difficulty reading newspaper from home. SLP did reading task (newspaper) with pt today. SLP highlighted key words for pt and he answered 4/6 questions correct by pt answering yes/no and pointing to correct portion in the article to indicate place where the answer was found. Pt will return CES next session or two.  08/13/22: SLP targeted multimodal communication with pt today. SLP used written key word cues, multiple choice written responses, and internet pictures for multiple choice, and pt used drawing, gesturing, and Google Maps to communicate that he has two watercraft he uses to fish on a pond on his property and on [place], and catches multiple types of fish. SLP modeled/highlighted these options to pt's wife for them to use at home. Pt/wife have been practicing body parts at  home for pain management. SLP suggested pt might want to also practice clothing items and put body parts on hold, due to gestures usually accurate without verbal explanation for pain.  07-26-22: SLP reviewed the results of the evaluation and requested pt's wife [name] put written names by pt's photos he has been practicing with.   PATIENT EDUCATION: Education details: see "Today's therapy" Person educated: Patient and Spouse Education method: Explanation and Demonstration Education comprehension: verbalized understanding and needs further education   GOALS: Goals reviewed with patient? Yes, in general  SHORT TERM GOALS: Target date: 08/24/2022   Pt will name 6 salient family names when given picture and written name with consistent mod-max cues in 3 sessions (precursor for speech generating device) Baseline: Goal status: Ongoing  2.  Pt will produce simple phoneme/ bilabial+/a/  /a/, /ba/, /ma/, and/or /pa/ simultaneously with SLP 65% success (equal # reps/stimulus) in 2 sessions Baseline:  Goal status: Ongoing  3.  Pt will demo understanding of simple commands in context 80% over three sessions Baseline:  Goal status: Ongoing  4.  Pt will indicate understanding of a complex sentence 85% of the time in 3 sessions (precursor for speech generating device) Baseline:  Goal status: Modified  5.  Pt will copy written family names with 50% success in  two sessions  Baseline:  Goal Status: Ongoing   LONG TERM GOALS: Target date: 10/24/2022  (or 20th visit)  Pt will name 6 salient family names when given picture and written name with consistent mod cues in 3 sessions (precursor for speech generating device) Baseline:  Goal status: Ongoing  2.  Pt will IMITATE simple phoneme/bilabial+/a/  /a/, /ba/, /ma/, and/or /pa/  65% success (equal # reps/stimulus) in 2 sessions Baseline:  Goal status: Ongoing  3.  Pt will generate meaningful message understood by [name] using speech generating  device 60% of the time in 3 sessions Baseline:  Goal status: Ongoing  4.  [name] will indicate that spontaneous communication is easier than during week of evaluation Baseline:  Goal status: Ongoing   ASSESSMENT:  CLINICAL IMPRESSION: Patient is a 61 y.o. male who was seen today for treatment of aphasia. 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 SLP modified reading goal. SEE TX NOTE from today for more details.  OBJECTIVE IMPAIRMENTS include expressive language, receptive language, and aphasia. These impairments are limiting patient from return to work, managing medications, managing appointments, household responsibilities, ADLs/IADLs, and effectively communicating at home and in community. Factors affecting potential to achieve goals and functional outcome are ability to learn/carryover information and severity of impairments. Patient will benefit from skilled SLP services to address above impairments and improve overall function.  REHAB POTENTIAL: Good  PLAN: SLP FREQUENCY: 2x/week  SLP DURATION: 12 weeks  PLANNED INTERVENTIONS: Language facilitation, Environmental controls, Cueing hierachy, Internal/external aids, Functional tasks, and Multimodal communication approach    Promyse Ardito, CCC-SLP 08/18/2022, 1:37 PM

## 2022-08-18 NOTE — Progress Notes (Signed)
[facility] Summary Report / Loop Recorder 

## 2022-08-18 NOTE — Therapy (Signed)
OUTPATIENT OCCUPATIONAL THERAPY NEURO TREATMENT NOTE  Patient Name: Gordon Fisher MRN: [id number] DOB:[birth year], 61 y.o., male Today's Date: 08/18/2022  PCP: none on file REFERRING PROVIDER: Fanny Dance, MD    OT End of Session - 08/18/22 1405     Visit Number 5    Number of Visits 17    Date for OT Re-Evaluation 09/24/22    Authorization Type [employer]    Authorization - Visit Number 5    Authorization - Number of Visits 20    OT Start Time 1403    OT Stop Time 1451    OT Time Calculation (min) 48 min    Activity Tolerance Patient tolerated treatment well    Behavior During Therapy WFL for tasks assessed/performed            Past Medical History:  Diagnosis Date   Stroke [facility])    Past Surgical History:  Procedure Laterality Date   BUBBLE STUDY  02/15/2022   Procedure: BUBBLE STUDY;  Surgeon: Christell Constant, MD;  Location: [facility] ENDOSCOPY;  Service: Cardiovascular;;   IR CT HEAD LTD  02/11/2022   IR CT HEAD LTD  02/11/2022   IR CT HEAD LTD  02/11/2022   IR PERCUTANEOUS ART THROMBECTOMY/INFUSION INTRACRANIAL INC DIAG ANGIO  02/11/2022   IR PERCUTANEOUS ART THROMBECTOMY/INFUSION INTRACRANIAL INC DIAG ANGIO  02/11/2022   IR US GUIDE VASC ACCESS RIGHT  02/11/2022   IR US GUIDE VASC ACCESS RIGHT  02/11/2022   RADIOLOGY WITH ANESTHESIA N/A 02/11/2022   Procedure: IR WITH ANESTHESIA;  Surgeon: Radiologist, Medication, MD;  Location: [facility] OR;  Service: Radiology;  Laterality: N/A;   RADIOLOGY WITH ANESTHESIA N/A 02/11/2022   Procedure: RADIOLOGY WITH ANESTHESIA;  Surgeon: Radiologist, Medication, MD;  Location: [facility] OR;  Service: Radiology;  Laterality: N/A;   TEE WITHOUT CARDIOVERSION N/A 02/15/2022   Procedure: TRANSESOPHAGEAL ECHOCARDIOGRAM (TEE);  Surgeon: Christell Constant, MD;  Location: [facility] ENDOSCOPY;  Service: Cardiovascular;  Laterality: N/A;   Patient Active Problem List   Diagnosis Date Noted   Hypocalcemia 03/22/2022   DVT, lower extremity,  distal (HCC) 03/22/2022   Obesity 03/22/2022   Elevated LDL cholesterol level 03/22/2022   Acute ischemic left middle cerebral artery (MCA) stroke (HCC) 02/18/2022   Cerebrovascular accident (CVA) due to occlusion of left middle cerebral artery (HCC) 02/11/2022    ONSET DATE: referral 07/15/22 (CVA 02/11/22)  REFERRING DIAG: I63.9 (ICD-10-CM) - Cerebrovascular accident (CVA), unspecified mechanism (HCC) R13.10 (ICD-10-CM) - Dysphagia, unspecified type   THERAPY DIAG:  Hemiplegia and hemiparesis following cerebral infarction affecting right dominant side (HCC)  Other lack of coordination  Other disturbances of skin sensation  Muscle weakness (generalized)  Other abnormalities of gait and mobility  Rationale for Evaluation and Treatment Rehabilitation  SUBJECTIVE:   SUBJECTIVE STATEMENT: Pt indicated w/ gestures and yes/no responses that R shoulder ER PROM  in supine was half painful and half stretching/pulling Pt accompanied by: family member (wife, [name])  PERTINENT HISTORY: Ischemic L MCA CVA w/ residual R-sided hemiparesis 02/11/22; in relatively good health prior to onset  PRECAUTIONS: Fall  PAIN: Are you having pain? No  FALLS: Has patient fallen in last 6 months? Yes. Number of falls 1  PLOF: Independent, Independent with basic ADLs, and Independent with gait  PATIENT GOALS: continue to make gains, independence with getting dressed   OBJECTIVE:   HAND DOMINANCE: Right  FUNCTIONAL OUTCOME MEASURES: FOTO: 11  UPPER EXTREMITY ROM    Active ROM: Pt with no AROM during evaluation.  Pt was able to elicit min shoulder elevation and slight gross grasp when cued and provided increased time to initiate movement.    Passive ROM Right Eval - 9/25  Shoulder flexion 100  Shoulder abduction   Shoulder adduction   Shoulder extension   Shoulder internal rotation WNL  Shoulder external rotation onset of pain >5-10* from neutral  Middle trapezius   Lower trapezius    Elbow flexion WNL  Elbow extension WNL  Wrist flexion WNL  Wrist extension WNL  Wrist ulnar deviation   Wrist radial deviation   Wrist pronation   Wrist supination   (Blank rows = not tested)  HAND FUNCTION: No functional grasp. Trace gross grasp with increased time for initiation  SENSATION: Difficult to assess due to expressive aphasia  MUSCLE TONE: RUE: Mild and Hypertonic  ------------------------------------------------------------------------------------------------------------------------------------------------------------ (objective measures above completed at initial evaluation unless otherwise dated)  TODAY'S TREATMENT:   08/18/22 Supine Exercises: OT facilitated R shoulder ROM exercises w/ pt in supine position for scapular stability throughout. Attempted ER, elbow flex/ext, activation of anterior deltoid for shoulder flex, and composite wrist/hand extension; incorporated PROM (both OT-facilitated) and self-PROM), AROM, AAROM, and place-and-hold exercises. Pillow positioned under UE for shoulder support w/ good alignment. Able to achieve good PROM of shoulder ER w/ report of increased pain after repetition though pt was unable to localize discomfort, so exercise was d/c. Elbow flex and ext AROM was limited by incr tone and spasticity w/ OT incorporating visual cues and active-assist for success. Unable to achieve shoulder flexion w/ elbow flexed or extended, demonstrating significant compensation w/ internal rotation. Education provided on self-ROM, emphasizing not pulling on R shoulder during any exercises and not lifting R shoulder past 90 deg of flexion; pt and his wife verbalized understanding. Sidelying Exercises: Attempted towel slides for R shoulder flex/ext in sidelying position w/ UE on tabletop. OT provided consistent tactile and verbal cues to decrease compensatory trunk movements; physical assist for AAROM prn. Achieved trace to poor activation w/ ext > flex. RUE  NMR sitting upright: Towel slides for R shoulder ER while sitting EOM, focusing on AAROM and gentle PROM w/ OT providing education on benefit of ER for scapular stability. Also attempted bilateral shoulder shrugs w/ verbal cues for consistent activation of RUE. Condition-Specific Education: see below   08/16/22 WB through elbow: NMR with WB through R elbow on box to facilitate increased WB and stabilization through shoulder while engaging in dynamic reaching with LUE. OT providing tactile cues at R arm to maintain positioning and to provide intermittent external feedback for WB and weight shifting.   OT moving target to further facilitate reach and ultimately WB through RUE. Educated on importance of WB for NMR. UE Ranger: engaged in shoulder flexion/extension and horizontal abduction/adduction with UE Ranger.  Pt initially with difficulty with flexion and abduction however with repetition and mod cues for visual attention and tactile cues to minimize compensatory trunk movements. Pt demonstrating improvements in activation with tactile cue at elbow to minimize effects of gravity.   Shoulder ROM: shoulder flexion/extension and abduction/adduction with ball on leg to facilitate increased activation and motor control.  OT providing guarding and intermittent tactile cues at elbow, however pt demonstrating improved motor control with min-mod trunk incorporation and no dropping of ball or hand off ball.   PATIENT EDUCATION: Ongoing condition-specific education, particularly regarding spasticity and options for management (e.g., stretching, ROM, oral medication, botox injection) w/ handout from American Stroke Association website administered at conclusion of session  Person  educated: Patient and Spouse Education method: Explanation, Demonstration, Tactile cues, Verbal cues, and Handouts Education comprehension: verbalized understanding and needs further education   HOME EXERCISE PROGRAM: Written  exercises of shoulder and elbow in supine with cues for hand placement and facilitation.  MedBridge Access Code: [email or web address] URL: [email or web address] - Supine Shoulder Flexion AAROM with Hands Clasped  - 1 x daily - 1 sets - 10 reps - Seated Elbow Extension and Shoulder External Rotation AAROM at Table with Towel  - 1 x daily - 1 sets - 10 reps - Finger Extension with Wrist Extension Caregiver PROM  - 1 x daily - 1 sets - 10 reps  GOALS: Goals reviewed with patient? Yes  SHORT TERM GOALS: Target date: 08/27/22  Pt and spouse will be independent with PROM and self-ROM HEP for shoulder stability and ROM. Baseline: Goal status: IN PROGRESS  2.  Pt will verbalize understanding of task modifications and/or potential A/E needs to increase ease, safety, and independence w/ ADLs Baseline:  Goal status: IN PROGRESS  3.  Pt will verbalize understanding of strategies and use of RUE as stabilizer during ADLs. Baseline:  Goal status: IN PROGRESS   LONG TERM GOALS: Target date: 09/24/22  Pt will demonstrate ability to complete UB dressing with supervision with use of hemi-dressing technique. Baseline:  Goal status: IN PROGRESS  2.  Pt will demonstrate improved awareness of and positioning of RUE during mobility and self-care tasks. Baseline:  Goal status: IN PROGRESS  3.  Pt will utilize RUE as gross assist during grooming tasks (such as washing hands and applying toothpaste). Baseline:  Goal status: IN PROGRESS  4.  Pt will verbalize and or demonstrate increased functional use of BUE during LB dressing to complete at supervision level. Baseline:  Goal status: IN PROGRESS  5.  Pt will verbalize improved functional incorporation of RUE with ADLs by reporting improved FOTO score to >/= to 20. Baseline: 11 Goal status: IN PROGRESS   ASSESSMENT:  CLINICAL IMPRESSION: OT continued to focus session on NMR of RUE, completing exercises in both supine and sidelying positions  for scapular support as well as benefit of gravity minimized positioning. Good results noted w/ gravity minimized positioning, though pt benefited from consistent multimodal cues to decrease compensatory movements and avoid holding his breath/straining during exercises. Education also provided on increased muscle tone and spasticity after CVA, using handouts and demonstration to facilitate understanding.  PERFORMANCE DEFICITS in functional skills including ADLs, IADLs, coordination, sensation, tone, ROM, strength, pain, flexibility, FMC, GMC, mobility, balance, body mechanics, vision, and UE functional use and psychosocial skills including environmental adaptation and routines and behaviors.   IMPAIRMENTS are limiting patient from ADLs and IADLs.   COMORBIDITIES may have co-morbidities  that affects occupational performance. Patient will benefit from skilled OT to address above impairments and improve overall function.   PLAN: OT FREQUENCY: 2x/week  OT DURATION: 8 weeks  PLANNED INTERVENTIONS: self care/ADL training, therapeutic exercise, therapeutic activity, neuromuscular re-education, manual therapy, passive range of motion, balance training, functional mobility training, splinting, electrical stimulation, compression bandaging, moist heat, cryotherapy, patient/family education, visual/perceptual remediation/compensation, psychosocial skills training, energy conservation, coping strategies training, and DME and/or AE instructions  RECOMMENDED OTHER SERVICES: N/A  CONSULTED AND AGREED WITH PLAN OF CARE: Patient and family member/caregiver  PLAN FOR NEXT SESSION: Review PROM and self-ROM HEP, educate on use of RUE as stabilizer during ADLs/functional tasks.   Kathrine Cords, OTR/L 08/18/2022, 3:58 PM

## 2022-08-23 ENCOUNTER — Ambulatory Visit: Payer: 59

## 2022-08-23 ENCOUNTER — Encounter: Payer: Self-pay | Admitting: Physical Therapy

## 2022-08-23 ENCOUNTER — Ambulatory Visit: Payer: 59 | Admitting: Occupational Therapy

## 2022-08-23 ENCOUNTER — Ambulatory Visit: Payer: 59 | Admitting: Physical Therapy

## 2022-08-23 DIAGNOSIS — M6281 Muscle weakness (generalized): Secondary | ICD-10-CM

## 2022-08-23 DIAGNOSIS — R482 Apraxia: Secondary | ICD-10-CM

## 2022-08-23 DIAGNOSIS — R262 Difficulty in walking, not elsewhere classified: Secondary | ICD-10-CM

## 2022-08-23 DIAGNOSIS — I69351 Hemiplegia and hemiparesis following cerebral infarction affecting right dominant side: Secondary | ICD-10-CM | POA: Diagnosis not present

## 2022-08-23 DIAGNOSIS — R2681 Unsteadiness on feet: Secondary | ICD-10-CM

## 2022-08-23 DIAGNOSIS — R208 Other disturbances of skin sensation: Secondary | ICD-10-CM

## 2022-08-23 DIAGNOSIS — R278 Other lack of coordination: Secondary | ICD-10-CM

## 2022-08-23 DIAGNOSIS — R4701 Aphasia: Secondary | ICD-10-CM

## 2022-08-23 NOTE — Therapy (Signed)
OUTPATIENT PHYSICAL THERAPY NEURO TREATMENT   Patient Name: Gordon Fisher MRN: [id number] DOB:Mar 04, 1961, 61 y.o., male Today's Date: 08/23/2022   PCP: None listed REFERRING PROVIDER: Jennye Boroughs, MD    PT End of Session - 08/23/22 1227     Visit Number 6    Number of Visits 16    Date for PT Re-Evaluation 09/20/22    Authorization Type [employer] [id number]    Authorization - Visit Number 6    Authorization - Number of Visits 20    Progress Note Due on Visit 10    PT Start Time 1234    PT Stop Time 1314    PT Time Calculation (min) 40 min    Equipment Utilized During Treatment Gait belt    Activity Tolerance Patient tolerated treatment well    Behavior During Therapy WFL for tasks assessed/performed               Past Medical History:  Diagnosis Date   Stroke [facility])    Past Surgical History:  Procedure Laterality Date   BUBBLE STUDY  02/15/2022   Procedure: BUBBLE STUDY;  Surgeon: Werner Lean, MD;  Location: [place];  Service: Cardiovascular;;   IR CT HEAD LTD  02/11/2022   IR CT HEAD LTD  02/11/2022   IR CT HEAD LTD  02/11/2022   IR PERCUTANEOUS ART THROMBECTOMY/INFUSION INTRACRANIAL INC DIAG ANGIO  02/11/2022   IR PERCUTANEOUS ART THROMBECTOMY/INFUSION INTRACRANIAL INC DIAG ANGIO  02/11/2022   IR US GUIDE VASC ACCESS RIGHT  02/11/2022   IR US GUIDE VASC ACCESS RIGHT  02/11/2022   RADIOLOGY WITH ANESTHESIA N/A 02/11/2022   Procedure: IR WITH ANESTHESIA;  Surgeon: Radiologist, Medication, MD;  Location: [place];  Service: Radiology;  Laterality: N/A;   RADIOLOGY WITH ANESTHESIA N/A 02/11/2022   Procedure: RADIOLOGY WITH ANESTHESIA;  Surgeon: Radiologist, Medication, MD;  Location: [place];  Service: Radiology;  Laterality: N/A;   TEE WITHOUT CARDIOVERSION N/A 02/15/2022   Procedure: TRANSESOPHAGEAL ECHOCARDIOGRAM (TEE);  Surgeon: Werner Lean, MD;  Location: [facility] ENDOSCOPY;  Service: Cardiovascular;  Laterality: N/A;   Patient Active Problem List    Diagnosis Date Noted   Hypocalcemia 03/22/2022   DVT, lower extremity, distal ([place]) 03/22/2022   Obesity 03/22/2022   Elevated LDL cholesterol level 03/22/2022   Acute ischemic left middle cerebral artery (MCA) stroke ([place]) 02/18/2022   Cerebrovascular accident (CVA) due to occlusion of left middle cerebral artery ([place]) 02/11/2022    ONSET DATE: April 2023  REFERRING DIAG: I63.9 (ICD-10-CM) - Cerebrovascular accident (CVA), unspecified mechanism ([place]) R13.10 (ICD-10-CM) - Dysphagia, unspecified type   THERAPY DIAG:  Muscle weakness (generalized)  Unsteadiness on feet  Difficulty in walking, not elsewhere classified  Rationale for Evaluation and Treatment Rehabilitation  SUBJECTIVE:  SUBJECTIVE STATEMENT: Have the AFO consult on Wednesday.  The tape from last week helped.  Pt accompanied by: significant other  PERTINENT HISTORY: 02/11/2022 patient developed right-sided weakness, aphasia, went to hospital for evaluation.  Code stroke was activated.  He received TNK and then transferred to [facility].  He underwent cerebral angiogram with thrombectomy.  Unfortunately vessel reoccluded later the day and the second thrombectomy was attempted.  Left M2 dissection was identified and treated with stent but unfortunately stent demonstrated reocclusion   PAIN:  Are you having pain? No  PRECAUTIONS: Shoulder and Fall, Aphasia   WEIGHT BEARING RESTRICTIONS No  FALLS: Has patient fallen in last 6 months? Yes. Number of falls 1, required 3-assist to recover  LIVING ENVIRONMENT: Lives with: lives with their spouse Lives in: House/apartment Stairs: Yes: Internal: flight steps; bilateral but cannot reach both and External: 4-5 steps; ramp is also present.  Has following equipment at home: Hemi  walker, Wheelchair (manual), and sit-stand lift  PLOF: Independent  PATIENT GOALS regain independence  OBJECTIVE:     TODAY'S TREATMENT: 08/23/2022 Activity Comments  Reviewed HEP and pt able to perform with PT supervision and tactile cues.  Wife reports he is performing consistently at home.   2 MWT:  111 ft with hemiwalker and CGA  Improved from 82 ft at eval  Sit<>stand from mat surface, RLE tucked posteriorly, 2 x 5 reps Visual cues of mirror  In parallel bars:  used visual cues of mirror with standing activities: Lateral weightshifting with tactile cues provided to R hip Tactile cues to shift to RLE, through R hip, glut activation noted, with L step forward/back 2 x 5 resp Tactile cues to shift to RLE, through R hip, glut activation with L hip/knee flexion x 5 reps-mod assist for safety and cues for control RLE as stance, LLE propped on 4" step with head turns, head nods, 3 reps Mirror cues and tactile cues at R hip for R glut activation and increased RLE weightshift.  Able to activate R gluts initially, decreased ability with fatigue  Gait 25 ft x 4 reps, with hemiwalker,last rep with kinesiotape to R ant tib for activation Improved foot clearance noted with Kinesio tape        HOME EXERCISE PROGRAM Last updated: 08/18/22 Access Code: [email or web address] URL: [email or web address] Date: 08/18/2022 Prepared by: [facility]  Exercises - Forward Backward Weight Shift with Counter Support  - 1 x daily - 5 x weekly - 2 sets - 10 reps - Side to Side Weight Shift with Counter Support  - 1 x daily - 5 x weekly - 2 sets - 10 reps - Mini Squat with Counter Support  - 1 x daily - 5 x weekly - 2 sets - 10 reps - Stride Stance Weight Shift  - 1 x daily - 5 x weekly - 2 sets - 10 reps    Below measures were taken at time of initial evaluation unless otherwise specified:     DIAGNOSTIC FINDINGS: Follow-up MRI brain done revealing small areas  of acute infarct in left-MCA territory with mild hemorrhagic transformation in left parietal lobe and left-MCA bifurcation residual vascular thrombus   COGNITION: Overall cognitive status:  Difficult to assess due to aphasia   SENSATION: Light touch: Impaired  Proprioception: Impaired   COORDINATION: RLE impaired, some deficits on left foot with alternating movements and isolated movements  EDEMA:  None   MUSCLE TONE: RLE: Modifed Ashworth Scale 2 =  More marked increase in muscle tone through most of the ROM, but affected part(s) easily moved right quad most affected, no clonus right plantarflexors   MUSCLE LENGTH: Able to achieve full right knee extension to PROM  DTRs:  Patella 3+ = Brisk  POSTURE: No Significant postural limitations  LOWER EXTREMITY ROM:     LLE WNL, RLE AROM limited by weakness  LOWER EXTREMITY MMT:    MMT Right Eval Left Eval  Hip flexion 3- 5  Hip extension    Hip abduction 3- 4  Hip adduction 3- 5  Hip internal rotation    Hip external rotation    Knee flexion 3 5  Knee extension 2+ 5  Ankle dorsiflexion 0 5  Ankle plantarflexion 1 3+  Ankle inversion    Ankle eversion    (Blank rows = not tested)  BED MOBILITY:  NT  TRANSFERS: Assistive device utilized: Hemi walker  Sit to stand: Modified independence Stand to sit: SBA Chair to chair: SBA and CGA Floor:  Total assist  RAMP:  Level of Assistance: CGA Assistive device utilized: Hemi walker Ramp Comments:   CURB:  Level of Assistance: Min A Assistive device utilized: Hemi walker Curb Comments: cues in sequence  STAIRS:  Level of Assistance: CGA and Min A  Stair Negotiation Technique: Step to Pattern with Single Rail on Left  Number of Stairs: 10   Height of Stairs: 4-6"  Comments: cues and guarding  GAIT: Gait pattern: step to pattern, decreased ankle dorsiflexion- Right, and circumduction- Right Distance walked: 82 Assistive device utilized: Hemi walker Level of  assistance: CGA Comments: deficits during turning  FUNCTIONAL TESTs:  5 times sit to stand: 11.59 sec with 3 retro LOB and poor eccentric control Timed up and go (TUG): 34.75 sec Berg Balance Scale: 32/56 Dynamic Gait Index: NT 2 minute walk test: 82 ft w/ hemi-walker, 0.68 ft/sec  M-CTSIB  Condition 1: Firm Surface, EO 30 Sec, Normal and Mild Sway  Condition 2: Firm Surface, EC 30 Sec, Mild and Moderate Sway  Condition 3: Foam Surface, EO 30 Sec, Mild Sway  Condition 4: Foam Surface, EC 26 Sec, Moderate and Severe Sway     PATIENT SURVEYS:  FOTO 47%  TODAY'S TREATMENT:  assessment   PATIENT EDUCATION: Education details: assessment findings, PT scope of practice Person educated: Patient and Spouse Education method: Explanation Education comprehension: verbalized understanding   HOME EXERCISE PROGRAM: TBD    GOALS: Goals reviewed with patient? Yes  SHORT TERM GOALS: Target date: 08/23/2022  Patient will perform HEP with family/caregiver supervision for improved strength, balance, transfers, and gait  Baseline: Goal status: GOAL MET, 08/23/2022  2.  Ambulate 12 stairs with left HR and supervision to improve safety within home Baseline: CGA-min A; per report CGA/supervision 6 steps Goal status: PARTIALLY MET, 08/23/2022  3.  Demonstrate improved safety and efficiency with ambulation per distance of 100 ft during 2MWT Baseline: 82 ft w/ HW and CGA; 111 ft 08/23/2022 Goal status: GOAL MET, 08/23/2022    LONG TERM GOALS: Target date: 09/20/2022  Demonstrate improved balance and reduced risk for falls per score 40/56 Berg Balance Test Baseline: 32/56 Goal status:IN PROGRESS  2.  Demonstrate modified independent ambulation x 150 ft level surfaces using least restrictive AD in order to improve safety with home environment Baseline: CGA w/ HW Goal status: IN PROGRESS  3.  Manifest improved safety with gait per time of 18 sec TUG test using least restrictive  AD Baseline: 34.75 sec w/ HW   Goal status: IN PROGRESS    ASSESSMENT:  CLINICAL IMPRESSION: Assessed STGs this visit, with pt meeting 2 of 3 STGs.  Pt has met STG 1 for HEP and 3 for improved gait distance in 2 MWT.  STG 2 partially met for stairs at home, per wife report.  Pt is to have orthotic appointment on 08/25/22, and would benefit from AFO to assist with R dorsiflexion for improved foot clearance with gait and for improved stability in stance.  He responded well today to tactile cues at R hip to shift into hand; however, this decreases with fatigue and more complex instructions/activities.  He will continue to benefit from skilled PT towards LTGs for improved overall functional mobility and independence.  OBJECTIVE IMPAIRMENTS Abnormal gait, decreased activity tolerance, decreased balance, decreased coordination, decreased knowledge of use of DME, decreased mobility, difficulty walking, decreased strength, impaired sensation, impaired tone, impaired UE functional use, and improper body mechanics.   ACTIVITY LIMITATIONS carrying, lifting, bending, standing, squatting, stairs, transfers, bathing, toileting, dressing, reach over head, and locomotion level  PARTICIPATION LIMITATIONS: meal prep, cleaning, laundry, interpersonal relationship, driving, shopping, community activity, occupation, yard work, and leisure activities  PERSONAL FACTORS Time since onset of injury/illness/exacerbation are also affecting patient's functional outcome.   REHAB POTENTIAL: Excellent  CLINICAL DECISION MAKING: Evolving/moderate complexity  EVALUATION COMPLEXITY: Moderate  PLAN: PT FREQUENCY: 2x/week  PT DURATION: 8 weeks  PLANNED INTERVENTIONS: Therapeutic exercises, Therapeutic activity, Neuromuscular re-education, Balance training, Gait training, Patient/Family education, Self Care, Joint mobilization, Stair training, Vestibular training, Canalith repositioning, Orthotic/Fit training, DME  instructions, Aquatic Therapy, Dry Needling, Electrical stimulation, Spinal mobilization, Cryotherapy, Moist heat, Splintting, Taping, and Manual therapy  PLAN FOR NEXT SESSION: RLE step ups; use of mirror for R wt shift, RLE forced weightbearing, quadruped?   Amy Marriott, PT 08/23/22 2:04 PM Phone: [phone] Fax: [phone]   [facility] [address], [address] [place], [place] [address] Phone # [phone] Fax # [phone]        

## 2022-08-23 NOTE — Therapy (Signed)
OUTPATIENT SPEECH LANGUAGE PATHOLOGY TREATMENT   Patient Name: Gordon Fisher MRN: [id number] DOB:Mar 23, 1961, 61 y.o., male Today's Date: 08/23/2022  PCP: TBD REFERRING PROVIDER: Fanny Dance, MD    End of Session - 08/23/22 1322     Visit Number 5    Number of Visits 25    Date for SLP Re-Evaluation 10/24/22    Authorization - Number of Visits 20    Progress Note Due on Visit 5    SLP Start Time 1320    SLP Stop Time  1400    SLP Time Calculation (min) 40 min    Activity Tolerance Patient tolerated treatment well               Past Medical History:  Diagnosis Date   Stroke [facility])    Past Surgical History:  Procedure Laterality Date   BUBBLE STUDY  02/15/2022   Procedure: BUBBLE STUDY;  Surgeon: Christell Constant, MD;  Location: [facility] ENDOSCOPY;  Service: Cardiovascular;;   IR CT HEAD LTD  02/11/2022   IR CT HEAD LTD  02/11/2022   IR CT HEAD LTD  02/11/2022   IR PERCUTANEOUS ART THROMBECTOMY/INFUSION INTRACRANIAL INC DIAG ANGIO  02/11/2022   IR PERCUTANEOUS ART THROMBECTOMY/INFUSION INTRACRANIAL INC DIAG ANGIO  02/11/2022   IR US GUIDE VASC ACCESS RIGHT  02/11/2022   IR US GUIDE VASC ACCESS RIGHT  02/11/2022   RADIOLOGY WITH ANESTHESIA N/A 02/11/2022   Procedure: IR WITH ANESTHESIA;  Surgeon: Radiologist, Medication, MD;  Location: [facility] OR;  Service: Radiology;  Laterality: N/A;   RADIOLOGY WITH ANESTHESIA N/A 02/11/2022   Procedure: RADIOLOGY WITH ANESTHESIA;  Surgeon: Radiologist, Medication, MD;  Location: [facility] OR;  Service: Radiology;  Laterality: N/A;   TEE WITHOUT CARDIOVERSION N/A 02/15/2022   Procedure: TRANSESOPHAGEAL ECHOCARDIOGRAM (TEE);  Surgeon: Christell Constant, MD;  Location: [facility] ENDOSCOPY;  Service: Cardiovascular;  Laterality: N/A;   Patient Active Problem List   Diagnosis Date Noted   Hypocalcemia 03/22/2022   DVT, lower extremity, distal (HCC) 03/22/2022   Obesity 03/22/2022   Elevated LDL cholesterol level 03/22/2022   Acute ischemic left  middle cerebral artery (MCA) stroke (HCC) 02/18/2022   Cerebrovascular accident (CVA) due to occlusion of left middle cerebral artery (HCC) 02/11/2022    ONSET DATE: 02-11-22   REFERRING DIAG: I63.9 (ICD-10-CM) - Cerebrovascular accident (CVA), unspecified mechanism (HCC) R13.10 (ICD-10-CM) - Dysphagia, unspecified type   THERAPY DIAG:  Verbal apraxia  Aphasia  Rationale for Evaluation and Treatment Rehabilitation  SUBJECTIVE:   SUBJECTIVE STATEMENT: "Yup - yeah." Pt accompanied by: significant other  PERTINENT HISTORY: Gordon Fisher is a 61 year old male in relatively good health who was admitted via [facility] on 02/11/22 with right sided weakness, right facial droop and slurred speech. CTH showed L-MCA hyperdense sign and he received TNK prior to transfer. CTA showed L-MCA MI occlusion and he underwent cerebral angio with thrombectomy and TICI 3 flow by Dr. Lonzo Candy Sherlon Handing.  Follow up MRI brain done revealing small areas of acute infarct infarct in L-MCA territory with mild hemorrhagic transformation in left parietal lobe and L-MCA bifurcation residual vascular thrombus. He was taken back for thrombectomy with stent for dissection but had intra-stent occlusion which was unable to be recanalized. Post op CT showed small SAH which was stable on follow up. He was started on ASA/Brillinta. MBS 04/18 showed oral dysphagia with oral delay and no penetration or aspiration. He was started on D3, thins with supervision. Repeat MRI brain on 04/16 showed extensive evolving  acute ischemic L-MCA occlusion increased in size with associated edema, few additional small acute cortical subcortical infarcts involving contralateral right frontal lobe and left ACA distribution. Stroke felt to be due to unknown etiology and wife elected on Zio patch on discharge to rule out A fib. Patient with resultant global aphasia with limited verbal output, right inattention with right hemiplegia and knee instability on  standing attempts. [name] was d/c'd from [facility] on 03-19-22, and has had HHST since that time until last week.   PAIN:  Are you having pain? No  PATIENT GOALS Pt indicated he would like talking to improve  OBJECTIVE:  DIAGNOSTIC FINDINGS: MRI without contrast 02/13/22 IMPRESSION: 1. Extensive evolving acute ischemic left MCA distribution infarct, increased in size as compared to previous MRI from 02/11/2022. Associated edema without significant midline shift. 2. Small volume acute subarachnoid hemorrhage at the posterior aspect of the left Sylvian fissure, stable. 3. Few additional small volume acute ischemic cortical and subcortical infarcts involving the contralateral right frontal lobe and left ACA distribution as above. 4. Underlying chronic microvascular ischemic disease.    PATIENT REPORTED OUTCOME MEASURES (PROM): Communication Effectiveness Survey: Pt scored 8/32 - higher scores indicate more effective communication/QOL.   TODAY'S TREATMENT:  08/23/22: SLP targeted verbal expression with pt today. Heavy perseveration with "[name]" grandson's name for all J names. SLP even did months of the year and pt cont to have perseveration with [name] for all J names. "[name]" - pt cont to make this into two syllables 90% of the time, with consistent max A. Pt used multi-modal communization using chicken clucks with SLP assistance and encouragement when pt could not say "chickens", and pointed to ages of his grandchildren with SLP writing numbers 1-10. Mod-max A needed for grandson 2 months old. Pt showed 1 and then gestured taking a short person's height for SLP to know young child. MAX A needed for month names > 1 syllable. With one syllable months pt 80% success. Pt told SLP name of his truck, and functionally told SLP name of one of his two tractors. Req'd mod/max A otherwise. Pt to cont to practice salient/high frequency/high importance words.   08/18/22: SLP targeted spelling today with pt - 3  letter words 100%, 4-letter words 80% - [name] stated pt had s/sx dyslexia prior to CVA. SLP to monitor this. SLP provided some other hints/helpful suggestions for home practice and communication at home today. SLP noted pt read 3 and 4 letter words 50% accuracy when spelled correctly.   08/16/22: SLP assessed pt's reading comprehension - pt reading single words 6/6 and simple sentences of 6-9 words ("the boy is riding his bike in the park") 6/6. [name] said pt having difficulty reading newspaper from home. SLP did reading task (newspaper) with pt today. SLP highlighted key words for pt and he answered 4/6 questions correct by pt answering yes/no and pointing to correct portion in the article to indicate place where the answer was found. Pt will return CES next session or two.  08/13/22: SLP targeted multimodal communication with pt today. SLP used written key word cues, multiple choice written responses, and internet pictures for multiple choice, and pt used drawing, gesturing, and Google Maps to communicate that he has two watercraft he uses to fish on a pond on his property and on [address], and catches multiple types of fish. SLP modeled/highlighted these options to pt's wife for them to use at home. Pt/wife have been practicing body parts at home for pain management. SLP  suggested pt might want to also practice clothing items and put body parts on hold, due to gestures usually accurate without verbal explanation for pain.  07-26-22: SLP reviewed the results of the evaluation and requested pt's wife [name] put written names by pt's photos he has been practicing with.   PATIENT EDUCATION: Education details: see "Today's therapy" Person educated: Patient and Spouse Education method: Explanation and Demonstration Education comprehension: verbalized understanding and needs further education   GOALS: Goals reviewed with patient? Yes, in general  SHORT TERM GOALS: Target date: 08/31/2022 extended one  week due to visits  Pt will name 6 salient family names when given picture and written name with consistent mod-max cues in 3 sessions (precursor for speech generating device) Baseline: Goal status: Ongoing  2.  Pt will produce simple phoneme/ bilabial+/a/  /a/, /ba/, /ma/, and/or /pa/ simultaneously with SLP 65% success (equal # reps/stimulus) in 2 sessions Baseline:  Goal status: Ongoing  3.  Pt will demo understanding of simple commands in context 80% over three sessions Baseline:  Goal status: Ongoing  4.  Pt will indicate understanding of a complex sentence 85% of the time in 3 sessions (precursor for speech generating device) Baseline:  Goal status: Ongoing  5.  Pt will copy written family names with 50% success in two sessions  Baseline:  Goal Status: Ongoing   LONG TERM GOALS: Target date: 10/24/2022  (or 20th visit)  Pt will name 6 salient family names when given picture and written name with consistent mod cues in 3 sessions (precursor for speech generating device) Baseline:  Goal status: Ongoing  2.  Pt will IMITATE simple phoneme/bilabial+/a/  /a/, /ba/, /ma/, and/or /pa/  65% success (equal # reps/stimulus) in 2 sessions Baseline:  Goal status: Ongoing  3.  Pt will generate meaningful message understood by [name] using speech generating device 60% of the time in 3 sessions Baseline:  Goal status: Ongoing  4.  [name] will indicate that spontaneous communication is easier than during week of evaluation Baseline:  Goal status: Ongoing   ASSESSMENT:  CLINICAL IMPRESSION: Patient is a 61 y.o. male who was seen today for treatment of aphasia. 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 SLP modified reading goal. SEE TX NOTE from today for more details.  Pt STG due date extended one week due to visits.  OBJECTIVE IMPAIRMENTS include expressive language, receptive language, and aphasia.  These impairments are limiting patient from return to work, managing medications, managing appointments, household responsibilities, ADLs/IADLs, and effectively communicating at home and in community. Factors affecting potential to achieve goals and functional outcome are ability to learn/carryover information and severity of impairments. Patient will benefit from skilled SLP services to address above impairments and improve overall function.  REHAB POTENTIAL: Good  PLAN: SLP FREQUENCY: 2x/week  SLP DURATION: 12 weeks  PLANNED INTERVENTIONS: Language facilitation, Environmental controls, Cueing hierachy, Internal/external aids, Functional tasks, and Multimodal communication approach    Albertia Carvin, CCC-SLP 08/23/2022, 1:22 PM

## 2022-08-23 NOTE — Therapy (Signed)
OUTPATIENT OCCUPATIONAL THERAPY NEURO TREATMENT NOTE  Patient Name: Gordon Fisher MRN: [id number] DOB:01-04-1961, 61 y.o., male Today's Date: 08/23/2022  PCP: none on file REFERRING PROVIDER: Fanny Dance, MD    OT End of Session - 08/23/22 1634     Visit Number 6    Number of Visits 17    Date for OT Re-Evaluation 09/24/22    Authorization Type [employer]    Authorization - Visit Number 6    Authorization - Number of Visits 20    OT Start Time 1406    OT Stop Time 1446    OT Time Calculation (min) 40 min    Activity Tolerance Patient tolerated treatment well    Behavior During Therapy WFL for tasks assessed/performed             Past Medical History:  Diagnosis Date   Stroke [facility])    Past Surgical History:  Procedure Laterality Date   BUBBLE STUDY  02/15/2022   Procedure: BUBBLE STUDY;  Surgeon: Christell Constant, MD;  [facility];  Service: Cardiovascular;;   IR CT HEAD LTD  02/11/2022   IR CT HEAD LTD  02/11/2022   IR CT HEAD LTD  02/11/2022   IR PERCUTANEOUS ART THROMBECTOMY/INFUSION INTRACRANIAL INC DIAG ANGIO  02/11/2022   IR PERCUTANEOUS ART THROMBECTOMY/INFUSION INTRACRANIAL INC DIAG ANGIO  02/11/2022   IR US GUIDE VASC ACCESS RIGHT  02/11/2022   IR US GUIDE VASC ACCESS RIGHT  02/11/2022   RADIOLOGY WITH ANESTHESIA N/A 02/11/2022   Procedure: IR WITH ANESTHESIA;  Surgeon: Radiologist, Medication, MD;  Location: [facility] OR;  Service: Radiology;  Laterality: N/A;   RADIOLOGY WITH ANESTHESIA N/A 02/11/2022   Procedure: RADIOLOGY WITH ANESTHESIA;  Surgeon: Radiologist, Medication, MD;  Location: [facility] OR;  Service: Radiology;  Laterality: N/A;   TEE WITHOUT CARDIOVERSION N/A 02/15/2022   Procedure: TRANSESOPHAGEAL ECHOCARDIOGRAM (TEE);  Surgeon: Christell Constant, MD;  Location: [facility] ENDOSCOPY;  Service: Cardiovascular;  Laterality: N/A;   Patient Active Problem List   Diagnosis Date Noted   Hypocalcemia 03/22/2022   DVT, lower extremity,  distal (HCC) 03/22/2022   Obesity 03/22/2022   Elevated LDL cholesterol level 03/22/2022   Acute ischemic left middle cerebral artery (MCA) stroke (HCC) 02/18/2022   Cerebrovascular accident (CVA) due to occlusion of left middle cerebral artery (HCC) 02/11/2022    ONSET DATE: referral 07/15/22 (CVA 02/11/22)  REFERRING DIAG: I63.9 (ICD-10-CM) - Cerebrovascular accident (CVA), unspecified mechanism (HCC) R13.10 (ICD-10-CM) - Dysphagia, unspecified type   THERAPY DIAG:  Hemiplegia and hemiparesis following cerebral infarction affecting right dominant side (HCC)  Other lack of coordination  Other disturbances of skin sensation  Muscle weakness (generalized)  Unsteadiness on feet  Rationale for Evaluation and Treatment Rehabilitation  SUBJECTIVE:   SUBJECTIVE STATEMENT: Pt's spouse reports that she has been pleased with his increased ability to get in/out of bed or therapy mat. Pt accompanied by: family member (wife, [name])  PERTINENT HISTORY: Ischemic L MCA CVA w/ residual R-sided hemiparesis 02/11/22; in relatively good health prior to onset  PRECAUTIONS: Fall  PAIN: Are you having pain? No  FALLS: Has patient fallen in last 6 months? Yes. Number of falls 1  PLOF: Independent, Independent with basic ADLs, and Independent with gait  PATIENT GOALS: continue to make gains, independence with getting dressed   OBJECTIVE:   HAND DOMINANCE: Right  FUNCTIONAL OUTCOME MEASURES: FOTO: 11  UPPER EXTREMITY ROM    Active ROM: Pt with no AROM during evaluation.  Pt was able  to elicit min shoulder elevation and slight gross grasp when cued and provided increased time to initiate movement.    Passive ROM Right Eval - 9/25  Shoulder flexion 100  Shoulder abduction   Shoulder adduction   Shoulder extension   Shoulder internal rotation WNL  Shoulder external rotation onset of pain >5-10* from neutral  Middle trapezius   Lower trapezius   Elbow flexion WNL  Elbow extension  WNL  Wrist flexion WNL  Wrist extension WNL  Wrist ulnar deviation   Wrist radial deviation   Wrist pronation   Wrist supination   (Blank rows = not tested)  HAND FUNCTION: No functional grasp. Trace gross grasp with increased time for initiation  SENSATION: Difficult to assess due to expressive aphasia  MUSCLE TONE: RUE: Mild and Hypertonic  ------------------------------------------------------------------------------------------------------------------------------------------------------------ (objective measures above completed at initial evaluation unless otherwise dated)  TODAY'S TREATMENT:   08/23/22 Supine exercises: OT facilitated R shoulder ROM exercises in supine for improved scapular positioning and stability.  Attempted abduction/adduction, elbow flexion/extension, and shoulder flexion.  OT facilitated PROM and self-ROM with cues for hand placement to increase facilitation vs dependent mobilization.  Pt able to elicit elbow flexion/extension against gravity and in gravity minimized position with mod cues from OT for visual attention to UE and tactile cues to minimize compensatory strategies. Sidelying exercises: OT facilitated towel slides for R shoulder flexion/extension and elbow flexion/extension while in sidelying.  Pt benefiting from manual facilitation to fading intermittent tactile cues at UE, consistent tactile cues to decrease compensatory trunk movements.  OT providing verbal cues for visual attention to task.  Pt demonstrating min activation of elbow flexion/extension and trace shoulder movements.    08/18/22 Supine Exercises: OT facilitated R shoulder ROM exercises w/ pt in supine position for scapular stability throughout. Attempted ER, elbow flex/ext, activation of anterior deltoid for shoulder flex, and composite wrist/hand extension; incorporated PROM (both OT-facilitated) and self-PROM), AROM, AAROM, and place-and-hold exercises. Pillow positioned under UE  for shoulder support w/ good alignment. Able to achieve good PROM of shoulder ER w/ report of increased pain after repetition though pt was unable to localize discomfort, so exercise was d/c. Elbow flex and ext AROM was limited by incr tone and spasticity w/ OT incorporating visual cues and active-assist for success. Unable to achieve shoulder flexion w/ elbow flexed or extended, demonstrating significant compensation w/ internal rotation. Education provided on self-ROM, emphasizing not pulling on R shoulder during any exercises and not lifting R shoulder past 90 deg of flexion; pt and his wife verbalized understanding. Sidelying Exercises: Attempted towel slides for R shoulder flex/ext in sidelying position w/ UE on tabletop. OT provided consistent tactile and verbal cues to decrease compensatory trunk movements; physical assist for AAROM prn. Achieved trace to poor activation w/ ext > flex. RUE NMR sitting upright: Towel slides for R shoulder ER while sitting EOM, focusing on AAROM and gentle PROM w/ OT providing education on benefit of ER for scapular stability. Also attempted bilateral shoulder shrugs w/ verbal cues for consistent activation of RUE. Condition-Specific Education: see below   08/16/22 WB through elbow: NMR with WB through R elbow on box to facilitate increased WB and stabilization through shoulder while engaging in dynamic reaching with LUE. OT providing tactile cues at R arm to maintain positioning and to provide intermittent external feedback for WB and weight shifting.   OT moving target to further facilitate reach and ultimately WB through RUE. Educated on importance of WB for NMR. UE Ranger: engaged in  shoulder flexion/extension and horizontal abduction/adduction with UE Ranger.  Pt initially with difficulty with flexion and abduction however with repetition and mod cues for visual attention and tactile cues to minimize compensatory trunk movements. Pt demonstrating improvements in  activation with tactile cue at elbow to minimize effects of gravity.   Shoulder ROM: shoulder flexion/extension and abduction/adduction with ball on leg to facilitate increased activation and motor control.  OT providing guarding and intermittent tactile cues at elbow, however pt demonstrating improved motor control with min-mod trunk incorporation and no dropping of ball or hand off ball.   PATIENT EDUCATION: Ongoing condition-specific education, particularly regarding spasticity and options for management (e.g., stretching, ROM, oral medication, botox injection) w/ handout from American Stroke Association website administered at conclusion of session  Person educated: Patient and Spouse Education method: Explanation, Demonstration, Tactile cues, Verbal cues, and Handouts Education comprehension: verbalized understanding and needs further education   HOME EXERCISE PROGRAM: Written exercises of shoulder and elbow in supine with cues for hand placement and facilitation.  MedBridge Access Code: [email or web address] URL: [email or web address] - Supine Shoulder Flexion AAROM with Hands Clasped  - 1 x daily - 1 sets - 10 reps - Seated Elbow Extension and Shoulder External Rotation AAROM at Table with Towel  - 1 x daily - 1 sets - 10 reps - Finger Extension with Wrist Extension Caregiver PROM  - 1 x daily - 1 sets - 10 reps  GOALS: Goals reviewed with patient? Yes  SHORT TERM GOALS: Target date: 08/27/22  Pt and spouse will be independent with PROM and self-ROM HEP for shoulder stability and ROM. Baseline: Goal status: IN PROGRESS  2.  Pt will verbalize understanding of task modifications and/or potential A/E needs to increase ease, safety, and independence w/ ADLs Baseline:  Goal status: IN PROGRESS  3.  Pt will verbalize understanding of strategies and use of RUE as stabilizer during ADLs. Baseline:  Goal status: IN PROGRESS   LONG TERM GOALS: Target date: 09/24/22  Pt will  demonstrate ability to complete UB dressing with supervision with use of hemi-dressing technique. Baseline:  Goal status: IN PROGRESS  2.  Pt will demonstrate improved awareness of and positioning of RUE during mobility and self-care tasks. Baseline:  Goal status: IN PROGRESS  3.  Pt will utilize RUE as gross assist during grooming tasks (such as washing hands and applying toothpaste). Baseline:  Goal status: IN PROGRESS  4.  Pt will verbalize and or demonstrate increased functional use of BUE during LB dressing to complete at supervision level. Baseline:  Goal status: IN PROGRESS  5.  Pt will verbalize improved functional incorporation of RUE with ADLs by reporting improved FOTO score to >/= to 20. Baseline: 11 Goal status: IN PROGRESS   ASSESSMENT:  CLINICAL IMPRESSION: OT continued to focus session on NMR of RUE, completing exercises in both supine and sidelying positions for scapular support as well as benefit of gravity minimized positioning. Good results noted w/ gravity minimized positioning, though pt benefited from consistent multimodal cues to decrease compensatory movements and avoid holding his breath/straining during exercises. OT providing education on visually attending to UE during movements to increase activation, with min carryover.  PERFORMANCE DEFICITS in functional skills including ADLs, IADLs, coordination, sensation, tone, ROM, strength, pain, flexibility, FMC, GMC, mobility, balance, body mechanics, vision, and UE functional use and psychosocial skills including environmental adaptation and routines and behaviors.   IMPAIRMENTS are limiting patient from ADLs and IADLs.   COMORBIDITIES may have co-morbidities  that affects occupational  performance. Patient will benefit from skilled OT to address above impairments and improve overall function.   PLAN: OT FREQUENCY: 2x/week  OT DURATION: 8 weeks  PLANNED INTERVENTIONS: self care/ADL training, therapeutic  exercise, therapeutic activity, neuromuscular re-education, manual therapy, passive range of motion, balance training, functional mobility training, splinting, electrical stimulation, compression bandaging, moist heat, cryotherapy, patient/family education, visual/perceptual remediation/compensation, psychosocial skills training, energy conservation, coping strategies training, and DME and/or AE instructions  RECOMMENDED OTHER SERVICES: N/A  CONSULTED AND AGREED WITH PLAN OF CARE: Patient and family member/caregiver  PLAN FOR NEXT SESSION: Review PROM and self-ROM HEP, educate on use of RUE as stabilizer during ADLs/functional tasks.   Rosalio Loud, OTR/L 08/23/2022, 4:34 PM

## 2022-08-24 ENCOUNTER — Ambulatory Visit (HOSPITAL_COMMUNITY)
Admission: RE | Admit: 2022-08-24 | Discharge: 2022-08-24 | Disposition: A | Discharge status: Home / Self Care | Payer: 59 | Source: Ambulatory Visit | Attending: Neuroradiology | Admitting: Neuroradiology

## 2022-08-24 ENCOUNTER — Ambulatory Visit (HOSPITAL_COMMUNITY)
Admission: RE | Admit: 2022-08-24 | Discharge: 2022-08-24 | Disposition: A | Discharge status: Home / Self Care | Payer: 59 | Source: Ambulatory Visit | Attending: Student | Admitting: Student

## 2022-08-24 DIAGNOSIS — I63512 Cerebral infarction due to unspecified occlusion or stenosis of left middle cerebral artery: Secondary | ICD-10-CM

## 2022-08-24 MED ORDER — IOHEXOL 350 MG/ML SOLN
75.0000 mL | Freq: Once | INTRAVENOUS | Status: AC | PRN
  Administered 2022-08-24: 75 mL via INTRAVENOUS

## 2022-08-24 NOTE — Progress Notes (Signed)
Referring Physician(s): Covington,Jamie R  Chief Complaint: The patient is seen in follow up today s/p mechanical thrombectomy with rescue intracranial stenting.  History of present illness:  61 year old male with sudden onset right sided weakness, right-sided facial droop and slurred speech. He was first evaluated at [facility] where admission NIHSS was 8; baseline modified Rankin scale 0. Head CT showed small left frontal hypodensity (ASPECTS 9). CT angiogram of the head and neck showed an occlusion of the left M1/MCA. He received TNK at 7:21 a.m. on 02/11/2022 and was then transferred to [facility] for endovascular treatment. At admission to [facility], NIHSS was 10. He underwent mechanical thrombectomy with successful revascularization and showed significant improvement of the right-sided weakness with mild persistent expressive aphasia. However, around 5 p.m. on 02/11/2022 he presented sudden decline with relapsed right-sided weakness and aphasia. He underwent repeat mechanical thrombectomy with rescue stenting of the left MCA posterior division branch complicated by intra procedural stent construct thrombosis and reocclusion. The M1/MCA, anterior temporal artery and anterior division branch remained recanalized (TICI 2A). 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   He presents today for appointment accompanied by his wife. He has expressive aphasia but has been able to communicate with signs and cards at home. There is persistent RUE plegia and RLE weakness. Wife informs he is able to walk at home with a walker but needs wheelchair for longer distances. He underwent CTA of the head and neck to day for evaluation of stent patency.   Past Medical History:  Diagnosis Date   Stroke [facility])     Past Surgical History:  Procedure Laterality Date   BUBBLE STUDY  02/15/2022   Procedure: BUBBLE STUDY;  Surgeon: Werner Lean, MD;  Location: [place];  Service:  Cardiovascular;;   IR CT HEAD LTD  02/11/2022   IR CT HEAD LTD  02/11/2022   IR CT HEAD LTD  02/11/2022   IR PERCUTANEOUS ART THROMBECTOMY/INFUSION INTRACRANIAL INC DIAG ANGIO  02/11/2022   IR PERCUTANEOUS ART THROMBECTOMY/INFUSION INTRACRANIAL INC DIAG ANGIO  02/11/2022   IR US GUIDE VASC ACCESS RIGHT  02/11/2022   IR US GUIDE VASC ACCESS RIGHT  02/11/2022   RADIOLOGY WITH ANESTHESIA N/A 02/11/2022   Procedure: IR WITH ANESTHESIA;  Surgeon: Radiologist, Medication, MD;  Location: [place];  Service: Radiology;  Laterality: N/A;   RADIOLOGY WITH ANESTHESIA N/A 02/11/2022   Procedure: RADIOLOGY WITH ANESTHESIA;  Surgeon: Radiologist, Medication, MD;  Location: [place];  Service: Radiology;  Laterality: N/A;   TEE WITHOUT CARDIOVERSION N/A 02/15/2022   Procedure: TRANSESOPHAGEAL ECHOCARDIOGRAM (TEE);  Surgeon: Werner Lean, MD;  Location: [facility] ENDOSCOPY;  Service: Cardiovascular;  Laterality: N/A;    Allergies: Patient has no known allergies.  Medications: Prior to Admission medications   Medication Sig Start Date End Date Taking? Authorizing Provider  acetaminophen (TYLENOL) 325 MG tablet Take 1-2 tablets (325-650 mg total) by mouth every 4 (four) hours as needed for mild pain. 04/01/22   Jennye Boroughs, MD  apixaban (ELIQUIS) 5 MG TABS tablet TAKE ONE (1) TABLET BY MOUTH TWO (2) TIMES DAILY 05/19/22   Jennye Boroughs, MD  atorvastatin (LIPITOR) 40 MG tablet Take 1 tablet (40 mg total) by mouth every evening. 04/01/22   Jennye Boroughs, MD  calcium carbonate (TUMS) 500 MG chewable tablet Chew 1 tablet (200 mg of elemental calcium total) by mouth 2 (two) times daily. Purchase over the counter--for boderline low calcium levels. 03/18/22   Bary Leriche, PA-C  polyethylene glycol powder (GLYCOLAX/MIRALAX) 17 GM/SCOOP powder Take 17 g by mouth daily. 04/01/22   Fanny Dance, MD  ticagrelor (BRILINTA) 90 MG TABS tablet Take 1 tablet (90 mg total) by mouth 2 (two) times daily. 08/12/22   de Glori Luis, MD     Family History  Problem Relation Age of Onset   Thyroid disease Mother     Social History   Socioeconomic History   Marital status: Married    Spouse name: [name]   Number of children: 2   Years of education: Not on file   Highest education level: Associate degree: occupational, Scientist, product/process development, or vocational program  Occupational History   Not on file  Tobacco Use   Smoking status: Never    Passive exposure: Never   Smokeless tobacco: Never  Vaping Use   Vaping Use: Never used  Substance and Sexual Activity   Alcohol use: Never   Drug use: Never   Sexual activity: Never  Other Topics Concern   Not on file  Social History Narrative   04/27/22 lives with wife   Social Determinants of Health   Financial Resource Strain: Not on file  Food Insecurity: Not on file  Transportation Needs: Not on file  Physical Activity: Not on file  Stress: Not on file  Social Connections: Not on file     Vital Signs: There were no vitals taken for this visit.  Physical Exam Constitutional:      Appearance: Normal appearance.  HENT:     Head: Normocephalic and atraumatic.  Eyes:     Extraocular Movements: Extraocular movements intact.     Conjunctiva/sclera: Conjunctivae normal.     Pupils: Pupils are equal, round, and reactive to light.  Pulmonary:     Effort: Pulmonary effort is normal.  Musculoskeletal:     Cervical back: Normal range of motion.  Skin:    General: Skin is warm and dry.  Neurological:     Mental Status: He is alert. Mental status is at baseline.     Cranial Nerves: Cranial nerves 2-12 are intact.     Sensory: Sensation is intact.     Motor: Weakness present.     Comments: RUE plegia, RLE weakness 3/5. Marked expressive aphasia. Follows simple commands appropriately.      Imaging: I personally reviewed the CTA angiogram performed today. There is complete occlusion of the left MCA stent construct. Left MCA territory infarct is  again seen. No significant new findings.   Labs:  CBC: Recent Labs    03/01/22 0601 03/05/22 1756 03/08/22 0528 03/15/22 0631  WBC 6.8 15.4* 5.7 7.1  HGB 15.2 15.6 14.5 15.3  HCT 43.0 44.4 41.7 43.3  PLT 239 205 184 230    COAGS: Recent Labs    02/11/22 0654  INR 1.0  APTT 24    BMP: Recent Labs    02/22/22 0609 03/01/22 0601 03/08/22 0528 03/15/22 0631  NA 137 137 135 135  K 4.1 3.7 3.7 3.7  CL 106 105 106 105  CO2 24 24 23 23   GLUCOSE 142* 112* 125* 107*  BUN 25* 18 12 13   CALCIUM 8.5* 8.8* 8.9 8.9  CREATININE 1.06 1.08 1.04 1.12  GFRNONAA >60 >60 >60 >60    LIVER FUNCTION TESTS: Recent Labs    02/11/22 0654 02/19/22 0527  BILITOT 0.7 0.9  AST 23 26  ALT 27 29  ALKPHOS 68 68  PROT 7.2 6.6  ALBUMIN 4.1 3.1*    Assessment:  Gordon Fisher is a 61 year old with history of cryptogenic left MCA territory infarct status post mechanical thrombectomy x2 with rescue stenting.  His follow-up CT angiogram performed today shows complete occlusion of the left MCA stent construct.  Therefore, I instructed him and his wife to stop taking the Brilinta while continuing on Eliquis as prescribed by his neurologist.  No further neural interventional follow-up necessary.  We are available should any questions or concerns arise in the future.   Signed: Baldemar Lenis, MD 08/24/2022, 3:24 PM    I spent a total of    25 Minutes in face to face in clinical consultation, greater than 50% of which was counseling/coordinating care for left MCA infarct status post mechanical thrombectomy.

## 2022-08-25 ENCOUNTER — Ambulatory Visit: Payer: 59 | Admitting: Occupational Therapy

## 2022-08-25 ENCOUNTER — Ambulatory Visit: Payer: 59

## 2022-08-25 ENCOUNTER — Encounter: Payer: Self-pay | Admitting: Occupational Therapy

## 2022-08-25 DIAGNOSIS — I69351 Hemiplegia and hemiparesis following cerebral infarction affecting right dominant side: Secondary | ICD-10-CM

## 2022-08-25 DIAGNOSIS — R482 Apraxia: Secondary | ICD-10-CM

## 2022-08-25 DIAGNOSIS — R2689 Other abnormalities of gait and mobility: Secondary | ICD-10-CM

## 2022-08-25 DIAGNOSIS — R2681 Unsteadiness on feet: Secondary | ICD-10-CM

## 2022-08-25 DIAGNOSIS — M6281 Muscle weakness (generalized): Secondary | ICD-10-CM

## 2022-08-25 DIAGNOSIS — R278 Other lack of coordination: Secondary | ICD-10-CM

## 2022-08-25 DIAGNOSIS — R262 Difficulty in walking, not elsewhere classified: Secondary | ICD-10-CM

## 2022-08-25 DIAGNOSIS — R208 Other disturbances of skin sensation: Secondary | ICD-10-CM

## 2022-08-25 DIAGNOSIS — R4701 Aphasia: Secondary | ICD-10-CM

## 2022-08-25 NOTE — Therapy (Signed)
OUTPATIENT PHYSICAL THERAPY NEURO TREATMENT   Patient Name: Gordon Fisher MRN: [id number] DOB:1961/09/20, 61 y.o., male Today's Date: 08/25/2022   PCP: None listed REFERRING PROVIDER: Jennye Boroughs, MD    PT End of Session - 08/25/22 1304     Visit Number 7    Number of Visits 16    Date for PT Re-Evaluation 09/20/22    Authorization Type [employer] 2023    Authorization - Visit Number 7    Authorization - Number of Visits 20    Progress Note Due on Visit 10    PT Start Time 1315    PT Stop Time 1400    PT Time Calculation (min) 45 min    Equipment Utilized During Treatment Gait belt    Activity Tolerance Patient tolerated treatment well    Behavior During Therapy WFL for tasks assessed/performed               Past Medical History:  Diagnosis Date   Stroke [facility])    Past Surgical History:  Procedure Laterality Date   BUBBLE STUDY  02/15/2022   Procedure: BUBBLE STUDY;  Surgeon: Werner Lean, MD;  Location: [place];  Service: Cardiovascular;;   IR CT HEAD LTD  02/11/2022   IR CT HEAD LTD  02/11/2022   IR CT HEAD LTD  02/11/2022   IR PERCUTANEOUS ART THROMBECTOMY/INFUSION INTRACRANIAL INC DIAG ANGIO  02/11/2022   IR PERCUTANEOUS ART THROMBECTOMY/INFUSION INTRACRANIAL INC DIAG ANGIO  02/11/2022   IR US GUIDE VASC ACCESS RIGHT  02/11/2022   IR US GUIDE VASC ACCESS RIGHT  02/11/2022   RADIOLOGY WITH ANESTHESIA N/A 02/11/2022   Procedure: IR WITH ANESTHESIA;  Surgeon: Radiologist, Medication, MD;  Location: [place];  Service: Radiology;  Laterality: N/A;   RADIOLOGY WITH ANESTHESIA N/A 02/11/2022   Procedure: RADIOLOGY WITH ANESTHESIA;  Surgeon: Radiologist, Medication, MD;  Location: [place];  Service: Radiology;  Laterality: N/A;   TEE WITHOUT CARDIOVERSION N/A 02/15/2022   Procedure: TRANSESOPHAGEAL ECHOCARDIOGRAM (TEE);  Surgeon: Werner Lean, MD;  Location: [facility] ENDOSCOPY;  Service: Cardiovascular;  Laterality: N/A;   Patient Active Problem List    Diagnosis Date Noted   Hypocalcemia 03/22/2022   DVT, lower extremity, distal ([place]) 03/22/2022   Obesity 03/22/2022   Elevated LDL cholesterol level 03/22/2022   Acute ischemic left middle cerebral artery (MCA) stroke ([place]) 02/18/2022   Cerebrovascular accident (CVA) due to occlusion of left middle cerebral artery ([place]) 02/11/2022    ONSET DATE: April 2023  REFERRING DIAG: I63.9 (ICD-10-CM) - Cerebrovascular accident (CVA), unspecified mechanism ([place]) R13.10 (ICD-10-CM) - Dysphagia, unspecified type   THERAPY DIAG:  Hemiplegia and hemiparesis following cerebral infarction affecting right dominant side (HCC)  Muscle weakness (generalized)  Unsteadiness on feet  Difficulty in walking, not elsewhere classified  Other abnormalities of gait and mobility  Rationale for Evaluation and Treatment Rehabilitation  SUBJECTIVE:  SUBJECTIVE STATEMENT: Had orthotic assessment and will get an off-the-shelf item for AFO Pt accompanied by: significant other  PERTINENT HISTORY: 02/11/2022 patient developed right-sided weakness, aphasia, went to hospital for evaluation.  Code stroke was activated.  He received TNK and then transferred to [facility].  He underwent cerebral angiogram with thrombectomy.  Unfortunately vessel reoccluded later the day and the second thrombectomy was attempted.  Left M2 dissection was identified and treated with stent but unfortunately stent demonstrated reocclusion   PAIN:  Are you having pain? No  PRECAUTIONS: Shoulder and Fall, Aphasia   WEIGHT BEARING RESTRICTIONS No  FALLS: Has patient fallen in last 6 months? Yes. Number of falls 1, required 3-assist to recover  LIVING ENVIRONMENT: Lives with: lives with their spouse Lives in: House/apartment Stairs: Yes:  Internal: flight steps; bilateral but cannot reach both and External: 4-5 steps; ramp is also present.  Has following equipment at home: Hemi walker, Wheelchair (manual), and sit-stand lift  PLOF: Independent  PATIENT GOALS regain independence  OBJECTIVE:   TODAY'S TREATMENT: 08/25/22 Activity Comments  Gait training -PLS AFO on right ankle and HW on level surfaces x 85 ft -stair ambulation: facilitation of reciprocal pattern with Right foot remaining on 1st step to facilitate LLE reciprocal advancement -Limb advancement to 12" rise for hip flexion control 3x10 left/right mod-max A for RLE assist  Postural alignment/weight shifting, use of full-length mirror -standing on foam: lateral shift, eyes closed to position correction, hip/trunk rotations, static head positions (R rot, L rot, extension, etc)  LE gross/selective motor control 2.5# right ankle -standing hip flex 3x10 RLE; therapist facilitating trunk extension and eliminating compensatory -LAQ: therapist facilitating extension and trials of pt holding knee extension. Improved performance with tapping to quad in tandem with improved recruitment appreciated  TODAY'S TREATMENT: 08/23/2022 Activity Comments  Reviewed HEP and pt able to perform with PT supervision and tactile cues.  Wife reports he is performing consistently at home.   2 MWT:  111 ft with hemiwalker and CGA  Improved from 82 ft at eval  Sit<>stand from mat surface, RLE tucked posteriorly, 2 x 5 reps Visual cues of mirror  In parallel bars:  used visual cues of mirror with standing activities: Lateral weightshifting with tactile cues provided to R hip Tactile cues to shift to RLE, through R hip, glut activation noted, with L step forward/back 2 x 5 resp Tactile cues to shift to RLE, through R hip, glut activation with L hip/knee flexion x 5 reps-mod assist for safety and cues for control RLE as stance, LLE propped on 4" step with head turns, head nods, 3 reps Mirror cues and tactile cues at R hip for R glut activation and increased RLE weightshift.  Able to activate R gluts initially, decreased ability with fatigue  Gait 25 ft x 4 reps, with hemiwalker,last rep with kinesiotape to R ant tib for activation Improved foot clearance noted with Kinesio tape        HOME EXERCISE PROGRAM Last updated: 08/18/22 Access Code: [email or web address] URL: [email or web address] Date: 08/18/2022 Prepared by: [facility]  Exercises - Forward Backward Weight Shift with Counter Support  - 1 x daily - 5 x weekly - 2 sets - 10 reps - Side to Side Weight Shift with Counter Support  - 1 x daily - 5 x weekly - 2 sets - 10 reps - Mini Squat with Counter Support  - 1 x daily - 5 x weekly - 2 sets - 10 reps - Stride Stance Weight Shift  - 1 x daily - 5 x weekly - 2 sets - 10  reps    Below measures were taken at time of initial evaluation unless otherwise specified:     DIAGNOSTIC FINDINGS: Follow-up MRI brain done revealing small areas of acute infarct in left-MCA territory with mild hemorrhagic transformation in left parietal lobe and left-MCA bifurcation residual vascular thrombus   COGNITION: Overall cognitive status:  Difficult to assess due to aphasia   SENSATION: Light touch: Impaired  Proprioception: Impaired   COORDINATION: RLE impaired, some deficits on left foot with alternating movements and isolated movements  EDEMA:  None   MUSCLE TONE: RLE: Modifed Ashworth Scale 2 = More marked increase in muscle tone through most of the ROM, but affected part(s) easily moved right quad most affected, no clonus right plantarflexors   MUSCLE LENGTH: Able to achieve full right knee extension to PROM  DTRs:  Patella 3+ = Brisk  POSTURE: No Significant postural limitations  LOWER EXTREMITY ROM:     LLE WNL, RLE AROM limited by weakness  LOWER EXTREMITY MMT:    MMT Right Eval Left Eval  Hip flexion 3- 5  Hip extension    Hip abduction 3- 4  Hip adduction 3- 5  Hip internal rotation    Hip external rotation    Knee flexion 3 5  Knee extension 2+ 5  Ankle dorsiflexion 0 5  Ankle plantarflexion 1 3+  Ankle inversion    Ankle eversion    (Blank rows = not tested)  BED MOBILITY:  NT  TRANSFERS: Assistive device utilized: Hemi walker  Sit to stand: Modified independence Stand to sit: SBA Chair to chair: SBA and CGA Floor:  Total assist  RAMP:  Level of  Assistance: CGA Assistive device utilized: Hemi walker Ramp Comments:   CURB:  Level of Assistance: Min A Assistive device utilized: Nutritional therapist Comments: cues in sequence  STAIRS:  Level of Assistance: CGA and Min A  Stair Negotiation Technique: Step to Pattern with Single Rail on Left  Number of Stairs: 10   Height of Stairs: 4-6"  Comments: cues and  guarding  GAIT: Gait pattern: step to pattern, decreased ankle dorsiflexion- Right, and circumduction- Right Distance walked: 82 Assistive device utilized: Hemi walker Level of assistance: CGA Comments: deficits during turning  FUNCTIONAL TESTs:  5 times sit to stand: 11.59 sec with 3 retro LOB and poor eccentric control Timed up and go (TUG): 34.75 sec Berg Balance Scale: 32/56 Dynamic Gait Index: NT 2 minute walk test: 82 ft w/ hemi-walker, 0.68 ft/sec  M-CTSIB  Condition 1: Firm Surface, EO 30 Sec, Normal and Mild Sway  Condition 2: Firm Surface, EC 30 Sec, Mild and Moderate Sway  Condition 3: Foam Surface, EO 30 Sec, Mild Sway  Condition 4: Foam Surface, EC 26 Sec, Moderate and Severe Sway     PATIENT SURVEYS:  FOTO 47%  TODAY'S TREATMENT:  assessment   PATIENT EDUCATION: Education details: assessment findings, PT scope of practice Person educated: Patient and Spouse Education method: Explanation Education comprehension: verbalized understanding   HOME EXERCISE PROGRAM: TBD    GOALS: Goals reviewed with patient? Yes  SHORT TERM GOALS: Target date: 08/23/2022  Patient will perform HEP with family/caregiver supervision for improved strength, balance, transfers, and gait  Baseline: Goal status: GOAL MET, 08/23/2022  2.  Ambulate 12 stairs with left HR and supervision to improve safety within home Baseline: CGA-min A; per report CGA/supervision 6 steps Goal status: PARTIALLY MET, 08/23/2022  3.  Demonstrate improved safety and efficiency with ambulation per distance of 100 ft during 2MWT Baseline: 82 ft w/ HW and CGA; 111 ft 08/23/2022 Goal status: GOAL MET, 08/23/2022    LONG TERM GOALS: Target date: 09/20/2022  Demonstrate improved balance and reduced risk for falls per score 40/56 Berg Balance Test Baseline: 32/56 Goal status:IN PROGRESS  2.  Demonstrate modified independent ambulation x 150 ft level surfaces using least restrictive AD in order to  improve safety with home environment Baseline: CGA w/ HW Goal status: IN PROGRESS  3.  Manifest improved safety with gait per time of 18 sec TUG test using least restrictive AD Baseline: 34.75 sec w/ HW Goal status: IN PROGRESS    ASSESSMENT:  CLINICAL IMPRESSION: Improved performance in gait activities with use of AFO on right ankle with improved swing phase control with decrease in circumduction and improved loading response RLE in stance phase as well with device.  Difficulty with right/left orientation requiring frequent tactile cues for attention/sequence. Demo improved right quad activiation/recruitment in weight bearing vs open chain with trace right quad appreciated in seated position. Therapist facilitating postural correction to reduce compensatory movements. Continued sessions indicated to progress motor control and functional mobility  OBJECTIVE IMPAIRMENTS Abnormal gait, decreased activity tolerance, decreased balance, decreased coordination, decreased knowledge of use of DME, decreased mobility, difficulty walking, decreased strength, impaired sensation, impaired tone, impaired UE functional use, and improper body mechanics.   ACTIVITY LIMITATIONS carrying, lifting, bending, standing, squatting, stairs, transfers, bathing, toileting, dressing, reach over head, and locomotion level  PARTICIPATION LIMITATIONS: meal prep, cleaning, laundry, interpersonal relationship, driving, shopping, community activity, occupation, yard work, and leisure activities  Thomas Time since onset of injury/illness/exacerbation are also affecting patient's functional outcome.  REHAB POTENTIAL: Excellent  CLINICAL DECISION MAKING: Evolving/moderate complexity  EVALUATION COMPLEXITY: Moderate  PLAN: PT FREQUENCY: 2x/week  PT DURATION: 8 weeks  PLANNED INTERVENTIONS: Therapeutic exercises, Therapeutic activity, Neuromuscular re-education, Balance training, Gait training, Patient/Family  education, Self Care, Joint mobilization, Stair training, Vestibular training, Canalith repositioning, Orthotic/Fit training, DME instructions, Aquatic Therapy, Dry Needling, Electrical stimulation, Spinal mobilization, Cryotherapy, Moist heat, Splintting, Taping, and Manual therapy  PLAN FOR NEXT SESSION: RLE step ups; use of mirror for R wt shift, RLE forced weightbearing, quadruped?   2:18 PM, 08/25/22 M. Sherlyn Lees, PT, DPT Physical Therapist- [place] Office Number: [phone]    [facility] [address] [place], [place] [address] Phone # [phone] Fax # [phone]

## 2022-08-25 NOTE — Patient Instructions (Signed)
Shoulder Shrug     Bring shoulders up toward ears. Focus on your right shoulder. Hold at least 1 second. Relax. Repeat 10-15 times. Complete 2 sessions. Do 3 sessions per day.  [email or web address]   Copyright  VHI. All rights reserved.

## 2022-08-25 NOTE — Therapy (Signed)
OUTPATIENT OCCUPATIONAL THERAPY NEURO TREATMENT NOTE  Patient Name: JAKOREY MCCONATHY MRN: [id number] DOB:[birth year], 61 y.o., male Today's Date: 08/25/2022  PCP: none on file REFERRING PROVIDER: Fanny Dance, MD    OT End of Session - 08/25/22 1646     Visit Number 7    Number of Visits 17    Date for OT Re-Evaluation 09/24/22    Authorization Type [employer]    Authorization Time Period VL: 60 combined (OT/PT/ST)    Authorization - Visit Number 7    Authorization - Number of Visits 20    OT Start Time 1403    OT Stop Time 1446    OT Time Calculation (min) 43 min    Activity Tolerance Patient tolerated treatment well    Behavior During Therapy WFL for tasks assessed/performed            Past Medical History:  Diagnosis Date   Stroke [facility])    Past Surgical History:  Procedure Laterality Date   BUBBLE STUDY  02/15/2022   Procedure: BUBBLE STUDY;  Surgeon: Christell Constant, MD;  Location: [facility] ENDOSCOPY;  Service: Cardiovascular;;   IR CT HEAD LTD  02/11/2022   IR CT HEAD LTD  02/11/2022   IR CT HEAD LTD  02/11/2022   IR PERCUTANEOUS ART THROMBECTOMY/INFUSION INTRACRANIAL INC DIAG ANGIO  02/11/2022   IR PERCUTANEOUS ART THROMBECTOMY/INFUSION INTRACRANIAL INC DIAG ANGIO  02/11/2022   IR US GUIDE VASC ACCESS RIGHT  02/11/2022   IR US GUIDE VASC ACCESS RIGHT  02/11/2022   RADIOLOGY WITH ANESTHESIA N/A 02/11/2022   Procedure: IR WITH ANESTHESIA;  Surgeon: Radiologist, Medication, MD;  Location: [facility] OR;  Service: Radiology;  Laterality: N/A;   RADIOLOGY WITH ANESTHESIA N/A 02/11/2022   Procedure: RADIOLOGY WITH ANESTHESIA;  Surgeon: Radiologist, Medication, MD;  Location: [facility] OR;  Service: Radiology;  Laterality: N/A;   TEE WITHOUT CARDIOVERSION N/A 02/15/2022   Procedure: TRANSESOPHAGEAL ECHOCARDIOGRAM (TEE);  Surgeon: Christell Constant, MD;  Location: [facility] ENDOSCOPY;  Service: Cardiovascular;  Laterality: N/A;   Patient Active Problem List   Diagnosis Date Noted    Hypocalcemia 03/22/2022   DVT, lower extremity, distal (HCC) 03/22/2022   Obesity 03/22/2022   Elevated LDL cholesterol level 03/22/2022   Acute ischemic left middle cerebral artery (MCA) stroke (HCC) 02/18/2022   Cerebrovascular accident (CVA) due to occlusion of left middle cerebral artery (HCC) 02/11/2022    ONSET DATE: referral 07/15/22 (CVA 02/11/22)  REFERRING DIAG: I63.9 (ICD-10-CM) - Cerebrovascular accident (CVA), unspecified mechanism (HCC) R13.10 (ICD-10-CM) - Dysphagia, unspecified type   THERAPY DIAG:  Hemiplegia and hemiparesis following cerebral infarction affecting right dominant side (HCC)  Muscle weakness (generalized)  Other lack of coordination  Other disturbances of skin sensation  Other abnormalities of gait and mobility  Unsteadiness on feet  Rationale for Evaluation and Treatment Rehabilitation  SUBJECTIVE:   SUBJECTIVE STATEMENT: Pt's wife reports that movement during shoulder shrugs is the most movement she has seen. Also reports a family member made pt a mirror that he can use for his exercises. Pt accompanied by: family member (wife, [name])  PERTINENT HISTORY: Ischemic L MCA CVA w/ residual R-sided hemiparesis 02/11/22; in relatively good health prior to onset  PRECAUTIONS: Fall  PAIN: Are you having pain? No  FALLS: Has patient fallen in last 6 months? Yes. Number of falls 1  PLOF: Independent, Independent with basic ADLs, and Independent with gait  PATIENT GOALS: continue to make gains, independence with getting dressed   OBJECTIVE:   HAND  DOMINANCE: Right  FUNCTIONAL OUTCOME MEASURES: FOTO: 11  UPPER EXTREMITY ROM    Active ROM: Pt with no AROM during evaluation.  Pt was able to elicit min shoulder elevation and slight gross grasp when cued and provided increased time to initiate movement.    Passive ROM Right Eval - 9/25  Shoulder flexion 100  Shoulder abduction   Shoulder adduction   Shoulder extension   Shoulder  internal rotation WNL  Shoulder external rotation onset of pain >5-10* from neutral  Middle trapezius   Lower trapezius   Elbow flexion WNL  Elbow extension WNL  Wrist flexion WNL  Wrist extension WNL  Wrist ulnar deviation   Wrist radial deviation   Wrist pronation   Wrist supination   (Blank rows = not tested)  HAND FUNCTION: No functional grasp. Trace gross grasp with increased time for initiation  SENSATION: Difficult to assess due to expressive aphasia  MUSCLE TONE: RUE: Mild and Hypertonic  ------------------------------------------------------------------------------------------------------------------------------------------------------------ (objective measures above completed at initial evaluation unless otherwise dated)  TODAY'S TREATMENT:  08/25/22 UE Ranger: RUE shoulder flex/ext completed w/ UE Ranger for NMR; OT provided active assist during first set w/ verbal and tactile cues to increase activation w/ neutral results. 2nd set completed w/ elbow flexed slightly past 90 deg, tall mirror in front of pt, and w/ vibration to anterior deltoid and posterior shoulder during flex or ext respectively w/ positive results. Shoulder shrugs: bilateral shoulder elevation completed in front of tall mirror for visual feedback w/ OT providing verbal/tactile cues and applying vibration to facilitate muscle activation. Completed 3x10 w/ success improving w/ incr sets. Scapular retraction: bilateral retraction completed in front of tall mirror w/ OT providing support and facilitate at R shoulder blade for incr mobility, as well as tactile cues for consistent arc of motion Dressing: pt able to don open-front long-sleeve shirt w/ SPV, requiring extended time for success. Also discussed participation when donning bottoms, problem-solving w/ pt and his wife to determine strategies for increased independence. OT demonstrated cross-leg method w/out AD step-by-step w/ pt verbalizing  understanding.   08/23/22 Supine exercises: OT facilitated R shoulder ROM exercises in supine for improved scapular positioning and stability.  Attempted abduction/adduction, elbow flexion/extension, and shoulder flexion.  OT facilitated PROM and self-ROM with cues for hand placement to increase facilitation vs dependent mobilization.  Pt able to elicit elbow flexion/extension against gravity and in gravity minimized position with mod cues from OT for visual attention to UE and tactile cues to minimize compensatory strategies. Sidelying exercises: OT facilitated towel slides for R shoulder flexion/extension and elbow flexion/extension while in sidelying.  Pt benefiting from manual facilitation to fading intermittent tactile cues at UE, consistent tactile cues to decrease compensatory trunk movements.  OT providing verbal cues for visual attention to task.  Pt demonstrating min activation of elbow flexion/extension and trace shoulder movements.   08/18/22 Supine Exercises: OT facilitated R shoulder ROM exercises w/ pt in supine position for scapular stability throughout. Attempted ER, elbow flex/ext, activation of anterior deltoid for shoulder flex, and composite wrist/hand extension; incorporated PROM (both OT-facilitated) and self-PROM), AROM, AAROM, and place-and-hold exercises. Pillow positioned under UE for shoulder support w/ good alignment. Able to achieve good PROM of shoulder ER w/ report of increased pain after repetition though pt was unable to localize discomfort, so exercise was d/c. Elbow flex and ext AROM was limited by incr tone and spasticity w/ OT incorporating visual cues and active-assist for success. Unable to achieve shoulder flexion w/ elbow flexed  or extended, demonstrating significant compensation w/ internal rotation. Education provided on self-ROM, emphasizing not pulling on R shoulder during any exercises and not lifting R shoulder past 90 deg of flexion; pt and his wife  verbalized understanding. Sidelying Exercises: Attempted towel slides for R shoulder flex/ext in sidelying position w/ UE on tabletop. OT provided consistent tactile and verbal cues to decrease compensatory trunk movements; physical assist for AAROM prn. Achieved trace to poor activation w/ ext > flex. RUE NMR sitting upright: Towel slides for R shoulder ER while sitting EOM, focusing on AAROM and gentle PROM w/ OT providing education on benefit of ER for scapular stability. Also attempted bilateral shoulder shrugs w/ verbal cues for consistent activation of RUE. Condition-Specific Education: see below   PATIENT EDUCATION: Ongoing condition-specific education related to therapeutic interventions completed this session Person educated: Patient and Spouse Education method: Explanation, Demonstration, Tactile cues, Verbal cues, and Handouts Education comprehension: verbalized understanding and needs further education   HOME EXERCISE PROGRAM: Written exercises of shoulder and elbow in supine with cues for hand placement and facilitation.  MedBridge Access Code: [email or web address] URL: [email or web address] - Supine Shoulder Flexion AAROM with Hands Clasped  - 1 x daily - 1 sets - 10 reps - Seated Elbow Extension and Shoulder External Rotation AAROM at Table with Towel  - 1 x daily - 1 sets - 10 reps - Finger Extension with Wrist Extension Caregiver PROM  - 1 x daily - 1 sets - 10 reps  GOALS: Goals reviewed with patient? Yes  SHORT TERM GOALS: Target date: 08/27/22  Pt and spouse will be independent with PROM and self-ROM HEP for shoulder stability and ROM. Baseline: Goal status: IN PROGRESS  2.  Pt will verbalize understanding of task modifications and/or potential A/E needs to increase ease, safety, and independence w/ ADLs Baseline:  Goal status: IN PROGRESS  3.  Pt will verbalize understanding of strategies and use of RUE as stabilizer during ADLs. Baseline:  Goal status: IN  PROGRESS   LONG TERM GOALS: Target date: 09/24/22  Pt will demonstrate ability to complete UB dressing with supervision with use of hemi-dressing technique. Baseline:  Goal status: IN PROGRESS  2.  Pt will demonstrate improved awareness of and positioning of RUE during mobility and self-care tasks. Baseline:  Goal status: IN PROGRESS  3.  Pt will utilize RUE as gross assist during grooming tasks (such as washing hands and applying toothpaste). Baseline:  Goal status: IN PROGRESS  4.  Pt will verbalize and or demonstrate increased functional use of BUE during LB dressing to complete at supervision level. Baseline:  Goal status: IN PROGRESS  5.  Pt will verbalize improved functional incorporation of RUE with ADLs by reporting improved FOTO score to >/= to 20. Baseline: 11 Goal status: IN PROGRESS   ASSESSMENT:  CLINICAL IMPRESSION: Session today w/ focus on continued NMR of RUE, incorporating shoulder flex/ext w/ gravity minimized, as well as scapular activation and stability exercises. Success w/ shoulder shrugs notably improved w/ visual feedback provided by mirror in front of pt and w/ repetition. Continued difficulty w/ shoulder AROM; R shoulder subluxation likely contributing to limitations. OT also addressed dressing strategies, focusing on hemi techniques for increased independence; pt encouraged to try strategies at home and problem-solve w/ OT in upcoming sessions as needed.  PERFORMANCE DEFICITS in functional skills including ADLs, IADLs, coordination, sensation, tone, ROM, strength, pain, flexibility, FMC, GMC, mobility, balance, body mechanics, vision, and UE functional use and psychosocial skills including environmental adaptation and routines  and behaviors.   IMPAIRMENTS are limiting patient from ADLs and IADLs.   COMORBIDITIES may have co-morbidities  that affects occupational performance. Patient will benefit from skilled OT to address above impairments and improve  overall function.   PLAN: OT FREQUENCY: 2x/week  OT DURATION: 8 weeks  PLANNED INTERVENTIONS: self care/ADL training, therapeutic exercise, therapeutic activity, neuromuscular re-education, manual therapy, passive range of motion, balance training, functional mobility training, splinting, electrical stimulation, compression bandaging, moist heat, cryotherapy, patient/family education, visual/perceptual remediation/compensation, psychosocial skills training, energy conservation, coping strategies training, and DME and/or AE instructions  RECOMMENDED OTHER SERVICES: N/A  CONSULTED AND AGREED WITH PLAN OF CARE: Patient and family member/caregiver  PLAN FOR NEXT SESSION: Continue w/ scapular stability: shoulder shrugs (print pt instructions from session on 10/25), rolls, and scapular retraction; Practice cross-leg strategy for LB dressing   Rosie Fate, OTR/L 08/25/2022, 2:49 PM

## 2022-08-25 NOTE — Therapy (Signed)
OUTPATIENT SPEECH LANGUAGE PATHOLOGY TREATMENT   Patient Name: Gordon Fisher MRN: [id number] DOB:1961/08/13, 61 y.o., male Today's Date: 08/25/2022  PCP: TBD REFERRING PROVIDER: Jennye Boroughs, Fisher    End of Session - 08/25/22 1236     Visit Number 6    Number of Visits 25    Date for SLP Re-Evaluation 10/24/22    Authorization - Visit Number 5    Authorization - Number of Visits 20    SLP Start Time 3235    SLP Stop Time  5732    SLP Time Calculation (min) 42 min    Activity Tolerance Patient tolerated treatment well               Past Medical History:  Diagnosis Date   Stroke [facility])    Past Surgical History:  Procedure Laterality Date   BUBBLE STUDY  02/15/2022   Procedure: BUBBLE STUDY;  Surgeon: Gordon Lean, Fisher;  Location: [facility];  Service: Cardiovascular;;   IR CT HEAD LTD  02/11/2022   IR CT HEAD LTD  02/11/2022   IR CT HEAD LTD  02/11/2022   IR PERCUTANEOUS ART THROMBECTOMY/INFUSION INTRACRANIAL INC DIAG ANGIO  02/11/2022   IR PERCUTANEOUS ART THROMBECTOMY/INFUSION INTRACRANIAL INC DIAG ANGIO  02/11/2022   IR US GUIDE VASC ACCESS RIGHT  02/11/2022   IR US GUIDE VASC ACCESS RIGHT  02/11/2022   RADIOLOGY WITH ANESTHESIA N/A 02/11/2022   Procedure: IR WITH ANESTHESIA;  Surgeon: Gordon Fisher;  Location: [facility];  Service: Radiology;  Laterality: N/A;   RADIOLOGY WITH ANESTHESIA N/A 02/11/2022   Procedure: RADIOLOGY WITH ANESTHESIA;  Surgeon: Gordon Fisher;  Location: [place];  Service: Radiology;  Laterality: N/A;   TEE WITHOUT CARDIOVERSION N/A 02/15/2022   Procedure: TRANSESOPHAGEAL ECHOCARDIOGRAM (TEE);  Surgeon: Gordon Lean, Fisher;  Location: [facility] ENDOSCOPY;  Service: Cardiovascular;  Laterality: N/A;   Patient Active Problem List   Diagnosis Date Noted   Hypocalcemia 03/22/2022   DVT, lower extremity, distal ([facility]) 03/22/2022   Obesity 03/22/2022   Elevated LDL cholesterol level 03/22/2022   Acute ischemic  left middle cerebral artery (MCA) stroke ([place]) 02/18/2022   Cerebrovascular accident (CVA) due to occlusion of left middle cerebral artery ([place]) 02/11/2022    ONSET DATE: 02-11-22   REFERRING DIAG: I63.9 (ICD-10-CM) - Cerebrovascular accident (CVA), unspecified mechanism ([place]) R13.10 (ICD-10-CM) - Dysphagia, unspecified type   THERAPY DIAG:  Verbal apraxia  Aphasia  Rationale for Evaluation and Treatment Rehabilitation  SUBJECTIVE:   SUBJECTIVE STATEMENT: "Yeah." Pt accompanied by: significant other  PERTINENT HISTORY: Gordon Fisher is a 61 year old male in relatively good health who was admitted via [facility] on 02/11/22 with right sided weakness, right facial droop and slurred speech. CTH showed L-MCA hyperdense sign and he received TNK prior to transfer. CTA showed L-MCA MI occlusion and he underwent cerebral angio with thrombectomy and TICI 3 flow by Dr. Nelida Gores Norma [facility].  Follow up MRI brain done revealing small areas of acute infarct infarct in L-MCA territory with mild hemorrhagic transformation in left parietal lobe and L-MCA bifurcation residual vascular thrombus. He was taken back for thrombectomy with stent for dissection but had intra-stent occlusion which was unable to be recanalized. Post op CT showed small SAH which was stable on follow up. He was started on ASA/Brillinta. MBS 04/18 showed oral dysphagia with oral delay and no penetration or aspiration. He was started on D3, thins with supervision. Repeat MRI brain on 04/16 showed extensive evolving acute ischemic L-MCA  occlusion increased in size with associated edema, few additional small acute cortical subcortical infarcts involving contralateral right frontal lobe and left ACA distribution. Stroke felt to be due to unknown etiology and wife elected on Zio patch on discharge to rule out A fib. Patient with resultant global aphasia with limited verbal output, right inattention with right hemiplegia and knee instability on  standing attempts. [name] was d/c'd from [facility] on 03-19-22, and has had HHST since that time until last week.   PAIN:  Are you having pain? No  PATIENT GOALS Pt indicated he would like talking to improve  OBJECTIVE:  DIAGNOSTIC FINDINGS: MRI without contrast 02/13/22 IMPRESSION: 1. Extensive evolving acute ischemic left MCA distribution infarct, increased in size as compared to previous MRI from 02/11/2022. Associated edema without significant midline shift. 2. Small volume acute subarachnoid hemorrhage at the posterior aspect of the left Sylvian fissure, stable. 3. Few additional small volume acute ischemic cortical and subcortical infarcts involving the contralateral right frontal lobe and left ACA distribution as above. 4. Underlying chronic microvascular ischemic disease.    PATIENT REPORTED OUTCOME MEASURES (PROM): Communication Effectiveness Survey: Pt scored 8/32 - higher scores indicate more effective communication/QOL.   TODAY'S TREATMENT:  08/25/22: [name] brought pt's cards today and SLP reviewed them with pt, modleing to wife cueing methods and hierarchy.Pt functionally said 6/7 salient objects (without written words) with mod-max cues, and 1/7 spontaneously. He said family names with written cues 6/7 (functionally), and 1/7 wth max cues Shanda Bumps"). Pt demo'd understnading of directions in context 75% today. SLP introduce possibility of Lingraphica to pt and wife and educated about studies proving that AAC assists verbal communication and that pt may not have to have assistance with a AAC device but we will know more as time progresses.   08/23/22: SLP targeted verbal expression with pt today. Heavy perseveration with "[name]" grandson's name for all J names. SLP even did months of the year and pt cont to have perseveration with [name] for all J names. "[name]" - pt cont to make this into two syllables 90% of the time, with consistent max A. Pt used multi-modal communization using  chicken clucks with SLP assistance and encouragement when pt could not say "chickens", and pointed to ages of his grandchildren with SLP writing numbers 1-10. Mod-max A needed for grandson 2 months old. Pt showed 1 and then gestured taking a short person's height for SLP to know young child. MAX A needed for month names > 1 syllable. With one syllable months pt 80% success. Pt told SLP name of his truck, and functionally told SLP name of one of his two tractors. Req'd mod/max A otherwise. Pt to cont to practice salient/high frequency/high importance words.   08/18/22: SLP targeted spelling today with pt - 3 letter words 100%, 4-letter words 80% - [name] stated pt had s/sx dyslexia prior to CVA. SLP to monitor this. SLP provided some other hints/helpful suggestions for home practice and communication at home today. SLP noted pt read 3 and 4 letter words 50% accuracy when spelled correctly.   08/16/22: SLP assessed pt's reading comprehension - pt reading single words 6/6 and simple sentences of 6-9 words ("the boy is riding his bike in the park") 6/6. [name] said pt having difficulty reading newspaper from home. SLP did reading task (newspaper) with pt today. SLP highlighted key words for pt and he answered 4/6 questions correct by pt answering yes/no and pointing to correct portion in the article to indicate place where the  answer was found. Pt will return CES next session or two.    PATIENT EDUCATION: Education details: see "Today's therapy" Person educated: Patient and Spouse Education method: Explanation and Demonstration Education comprehension: verbalized understanding and needs further education   GOALS: Goals reviewed with patient? Yes, in general  SHORT TERM GOALS: Target date: 08/31/2022 extended one week due to visits  Pt will name 6 salient family names when given picture and written name with consistent mod-max cues in 3 sessions (precursor for speech generating  device) Baseline:08-25-22 Goal status: Ongoing  2.  Pt will produce simple phoneme/ bilabial+/a/  /a/, /ba/, /ma/, and/or /pa/ simultaneously with SLP 65% success (equal # reps/stimulus) in 2 sessions Baseline:  Goal status: deferred - pt to focus on salient items/terms  3.  Pt will demo understanding of simple commands in context 80% over three sessions Baseline: 08-25-22 Goal status: Ongoing  4.  Pt will indicate understanding of a complex sentence 85% of the time in 3 sessions (precursor for speech generating device) Baseline:  Goal status: Ongoing  5.  Pt will copy written family names with 50% success in two sessions  Baseline:  Goal Status: Ongoing   LONG TERM GOALS: Target date: 10/24/2022  (or 20th visit)  Pt will name 6 salient family names when given picture and written name with consistent mod cues in 3 sessions (precursor for speech generating device) Baseline:  Goal status: Ongoing  2.  Pt will IMITATE simple phoneme/bilabial+/a/  /a/, /ba/, /ma/, and/or /pa/  65% success (equal # reps/stimulus) in 2 sessions Baseline:  Goal status: Ongoing  3.  Pt will generate meaningful message understood by [name] using speech generating device 60% of the time in 3 sessions Baseline:  Goal status: Ongoing  4.  [name] will indicate that spontaneous communication is easier than during week of evaluation Baseline:  Goal status: Ongoing   ASSESSMENT:  CLINICAL IMPRESSION: Patient is a 61 y.o. male who was seen today for treatment of aphasia. 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 SLP modified reading goal. SEE TX NOTE from today for more details.  Pt STG due date extended one week due to visits.  OBJECTIVE IMPAIRMENTS include expressive language, receptive language, and aphasia. These impairments are limiting patient from return to work, managing medications, managing appointments, household  responsibilities, ADLs/IADLs, and effectively communicating at home and in community. Factors affecting potential to achieve goals and functional outcome are ability to learn/carryover information and severity of impairments. Patient will benefit from skilled SLP services to address above impairments and improve overall function.  REHAB POTENTIAL: Good  PLAN: SLP FREQUENCY: 2x/week  SLP DURATION: 12 weeks  PLANNED INTERVENTIONS: Language facilitation, Environmental controls, Cueing hierachy, Internal/external aids, Functional tasks, and Multimodal communication approach    Chancy Smigiel, CCC-SLP 08/25/2022, 12:37 PM

## 2022-08-30 ENCOUNTER — Encounter: Payer: Self-pay | Admitting: Physical Therapy

## 2022-08-30 ENCOUNTER — Ambulatory Visit: Payer: 59 | Admitting: Occupational Therapy

## 2022-08-30 ENCOUNTER — Ambulatory Visit: Payer: 59 | Admitting: Physical Therapy

## 2022-08-30 DIAGNOSIS — I69351 Hemiplegia and hemiparesis following cerebral infarction affecting right dominant side: Secondary | ICD-10-CM

## 2022-08-30 DIAGNOSIS — R208 Other disturbances of skin sensation: Secondary | ICD-10-CM

## 2022-08-30 DIAGNOSIS — R2681 Unsteadiness on feet: Secondary | ICD-10-CM

## 2022-08-30 DIAGNOSIS — M6281 Muscle weakness (generalized): Secondary | ICD-10-CM

## 2022-08-30 DIAGNOSIS — R278 Other lack of coordination: Secondary | ICD-10-CM

## 2022-08-30 DIAGNOSIS — R2689 Other abnormalities of gait and mobility: Secondary | ICD-10-CM

## 2022-08-30 NOTE — Therapy (Signed)
OUTPATIENT PHYSICAL THERAPY NEURO TREATMENT   Patient Name: Gordon Fisher MRN: [id number] DOB:February 16, 1961, 61 y.o., male Today's Date: 08/31/2022   PCP: None listed REFERRING PROVIDER: Jennye Boroughs, MD    PT End of Session - 08/30/22 1324     Visit Number 8    Number of Visits 16    Date for PT Re-Evaluation 09/20/22    Authorization Type [employer] [id number]    Authorization - Visit Number 8    Authorization - Number of Visits 20    Progress Note Due on Visit 10    PT Start Time 1324    PT Stop Time 1404    PT Time Calculation (min) 40 min    Equipment Utilized During Treatment Gait belt    Activity Tolerance Patient tolerated treatment well    Behavior During Therapy WFL for tasks assessed/performed                Past Medical History:  Diagnosis Date   Stroke [facility])    Past Surgical History:  Procedure Laterality Date   BUBBLE STUDY  02/15/2022   Procedure: BUBBLE STUDY;  Surgeon: Werner Lean, MD;  Location: [place];  Service: Cardiovascular;;   IR CT HEAD LTD  02/11/2022   IR CT HEAD LTD  02/11/2022   IR CT HEAD LTD  02/11/2022   IR PERCUTANEOUS ART THROMBECTOMY/INFUSION INTRACRANIAL INC DIAG ANGIO  02/11/2022   IR PERCUTANEOUS ART THROMBECTOMY/INFUSION INTRACRANIAL INC DIAG ANGIO  02/11/2022   IR US GUIDE VASC ACCESS RIGHT  02/11/2022   IR US GUIDE VASC ACCESS RIGHT  02/11/2022   RADIOLOGY WITH ANESTHESIA N/A 02/11/2022   Procedure: IR WITH ANESTHESIA;  Surgeon: Radiologist, Medication, MD;  Location: [place];  Service: Radiology;  Laterality: N/A;   RADIOLOGY WITH ANESTHESIA N/A 02/11/2022   Procedure: RADIOLOGY WITH ANESTHESIA;  Surgeon: Radiologist, Medication, MD;  Location: [place];  Service: Radiology;  Laterality: N/A;   TEE WITHOUT CARDIOVERSION N/A 02/15/2022   Procedure: TRANSESOPHAGEAL ECHOCARDIOGRAM (TEE);  Surgeon: Werner Lean, MD;  Location: [facility] ENDOSCOPY;  Service: Cardiovascular;  Laterality: N/A;   Patient Active Problem List    Diagnosis Date Noted   Hypocalcemia 03/22/2022   DVT, lower extremity, distal ([place]) 03/22/2022   Obesity 03/22/2022   Elevated LDL cholesterol level 03/22/2022   Acute ischemic left middle cerebral artery (MCA) stroke ([place]) 02/18/2022   Cerebrovascular accident (CVA) due to occlusion of left middle cerebral artery ([place]) 02/11/2022    ONSET DATE: April 2023  REFERRING DIAG: I63.9 (ICD-10-CM) - Cerebrovascular accident (CVA), unspecified mechanism ([place]) R13.10 (ICD-10-CM) - Dysphagia, unspecified type   THERAPY DIAG:  Muscle weakness (generalized)  Other abnormalities of gait and mobility  Unsteadiness on feet  Rationale for Evaluation and Treatment Rehabilitation  SUBJECTIVE:  SUBJECTIVE STATEMENT: Nothing new since last visit.  Had orthotic assessment and will get an off-the-shelf item for AFO-expecting around 11/20 Pt accompanied by: significant other  PERTINENT HISTORY: 02/11/2022 patient developed right-sided weakness, aphasia, went to hospital for evaluation.  Code stroke was activated.  He received TNK and then transferred to [facility].  He underwent cerebral angiogram with thrombectomy.  Unfortunately vessel reoccluded later the day and the second thrombectomy was attempted.  Left M2 dissection was identified and treated with stent but unfortunately stent demonstrated reocclusion   PAIN:  Are you having pain? No  PRECAUTIONS: Shoulder and Fall, Aphasia   WEIGHT BEARING RESTRICTIONS No  FALLS: Has patient fallen in last 6 months? Yes. Number of falls 1, required 3-assist to recover  LIVING ENVIRONMENT: Lives with: lives with their spouse Lives in: House/apartment Stairs: Yes: Internal: flight steps; bilateral but cannot reach both and External: 4-5 steps; ramp is also  present.  Has following equipment at home: Hemi walker, Wheelchair (manual), and sit-stand lift  PLOF: Independent  PATIENT GOALS regain independence  OBJECTIVE:    TODAY'S TREATMENT: 08/30/2022 Activity Comments  Gait training with trial PLS AFO on R foot: Using hemiwalker 85 ft x 2 reps Therapist in front of patient and pt's L arm on PT's shoulder, 85 ft x 2 reps, with tactile cues for RLE stance time, VCs for LLE longer stride Use of SBQC, 85 ft x 3 reps, with improved step through pattern of gait  facilitation and cues for increased weightshift, weightbearing and stance time on RLE, for improved step length LLE  NMR for RLE with RLE as stance, LLE step forward/back, 2 x 10 reps Wearing R AFO                 TODAY'S TREATMENT: 08/25/22 Activity Comments  Gait training -PLS AFO on right ankle and HW on level surfaces x 85 ft -stair ambulation: facilitation of reciprocal pattern with Right foot remaining on 1st step to facilitate LLE reciprocal advancement -Limb advancement to 12" rise for hip flexion control 3x10 left/right mod-max A for RLE assist  Postural alignment/weight shifting, use of full-length mirror -standing on foam: lateral shift, eyes closed to position correction, hip/trunk rotations, static head positions (R rot, L rot, extension, etc)  LE gross/selective motor control 2.5# right ankle -standing hip flex 3x10 RLE; therapist facilitating trunk extension and eliminating compensatory -LAQ: therapist facilitating extension and trials of pt holding knee extension. Improved performance with tapping to quad in tandem with improved recruitment appreciated  Gait 25 ft x 2 reps, with SBQC, without AFO, using  kinesiotape to R ant tib for activation Compared to using AFO, decreased foot clearance and decreased timing and coordination for RLE swing through with gait; wife does report she feels like it helps pt lift foot more than without using it                HOME EXERCISE PROGRAM Last updated: 08/18/22 Access Code: [email or web address] URL: [email or web address] Date: 08/18/2022 Prepared by: [facility]  Exercises - Forward Backward Weight Shift with Counter Support  - 1 x daily - 5 x weekly - 2 sets - 10 reps - Side to Side Weight Shift with Counter Support  - 1 x daily - 5 x weekly - 2 sets - 10 reps - Mini Squat with Counter Support  - 1 x daily - 5 x weekly - 2 sets - 10 reps - Stride Stance Weight Shift  - 1 x daily - 5 x weekly - 2 sets - 10 reps    Below measures were taken at time of initial evaluation unless otherwise specified:     DIAGNOSTIC FINDINGS: Follow-up MRI brain done revealing small areas of acute infarct in left-MCA territory with mild hemorrhagic transformation in left parietal lobe and left-MCA bifurcation residual vascular thrombus    COGNITION: Overall cognitive status:  Difficult to assess due to aphasia   SENSATION: Light touch: Impaired  Proprioception: Impaired   COORDINATION: RLE impaired, some deficits on left foot with alternating movements and isolated movements  EDEMA:  None   MUSCLE TONE: RLE: Modifed Ashworth Scale 2 = More marked increase in muscle tone through most of the ROM, but affected part(s) easily moved right quad most affected, no clonus right plantarflexors   MUSCLE LENGTH: Able to achieve full right knee extension to PROM  DTRs:  Patella 3+ = Brisk  POSTURE: No Significant postural limitations  LOWER EXTREMITY ROM:     LLE WNL, RLE AROM limited by weakness  LOWER EXTREMITY MMT:    MMT Right Eval Left Eval  Hip flexion 3- 5  Hip extension    Hip abduction 3- 4  Hip adduction 3- 5  Hip internal rotation    Hip external rotation    Knee flexion 3 5  Knee extension 2+ 5  Ankle dorsiflexion 0 5  Ankle plantarflexion 1 3+  Ankle inversion    Ankle eversion    (Blank rows = not tested)  BED MOBILITY:  NT  TRANSFERS: Assistive device utilized: Hemi walker  Sit to stand: Modified independence Stand to sit: SBA Chair to chair: SBA and CGA Floor:  Total assist  RAMP:  Level of Assistance: CGA Assistive device utilized: Hemi walker Ramp Comments:   CURB:  Level of Assistance: Min A Assistive device utilized: Nutritional therapist Comments: cues in sequence  STAIRS:  Level of Assistance: CGA and Min A  Stair Negotiation Technique: Step to Pattern with Single Rail on Left  Number of Stairs: 10   Height of Stairs: 4-6"  Comments: cues and guarding  GAIT: Gait pattern: step to pattern, decreased ankle dorsiflexion- Right, and circumduction- Right Distance walked: 82 Assistive device utilized: Hemi walker Level of assistance: CGA Comments: deficits during turning  FUNCTIONAL TESTs:  5 times sit to stand: 11.59 sec with 3 retro LOB and poor eccentric  control Timed up and go (TUG): 34.75 sec Berg Balance Scale: 32/56 Dynamic Gait Index: NT 2 minute walk test:  82 ft w/ hemi-walker, 0.68 ft/sec  M-CTSIB  Condition 1: Firm Surface, EO 30 Sec, Normal and Mild Sway  Condition 2: Firm Surface, EC 30 Sec, Mild and Moderate Sway  Condition 3: Foam Surface, EO 30 Sec, Mild Sway  Condition 4: Foam Surface, EC 26 Sec, Moderate and Severe Sway     PATIENT SURVEYS:  FOTO 47%  TODAY'S TREATMENT:  assessment   PATIENT EDUCATION: Education details: assessment findings, PT scope of practice Person educated: Patient and Spouse Education method: Explanation Education comprehension: verbalized understanding   HOME EXERCISE PROGRAM: TBD    GOALS: Goals reviewed with patient? Yes  SHORT TERM GOALS: Target date: 08/23/2022  Patient will perform HEP with family/caregiver supervision for improved strength, balance, transfers, and gait  Baseline: Goal status: GOAL MET, 08/23/2022  2.  Ambulate 12 stairs with left HR and supervision to improve safety within home Baseline: CGA-min A; per report CGA/supervision 6 steps Goal status: PARTIALLY MET, 08/23/2022  3.  Demonstrate improved safety and efficiency with ambulation per distance of 100 ft during 2MWT Baseline: 82 ft w/ HW and CGA; 111 ft 08/23/2022 Goal status: GOAL MET, 08/23/2022    LONG TERM GOALS: Target date: 09/20/2022  Demonstrate improved balance and reduced risk for falls per score 40/56 Berg Balance Test Baseline: 32/56 Goal status:IN PROGRESS  2.  Demonstrate modified independent ambulation x 150 ft level surfaces using least restrictive AD in order to improve safety with home environment Baseline: CGA w/ HW Goal status: IN PROGRESS  3.  Manifest improved safety with gait per time of 18 sec TUG test using least restrictive AD Baseline: 34.75 sec w/ HW Goal status: IN PROGRESS    ASSESSMENT:  CLINICAL IMPRESSION: Skilled PT session today focused on gait  progression using trial PLS AFO for gait training.  Pt initially started with his hemiwalker, then had therapist-only assist, then with small based quad cane.  With use of SBQC while wearing AFO, pt demonstrates improved RLE stance time, improved LLE step length for improved reciprocal, step-through pattern with gait.  Pt has been fitted for and is awaiting his AFO, which should improve gait mechanics and allow pt to progress to lesser assistive device.  Without AFO, pt continues to have decreased timing and coordination of RLE, foot drop and decreased foot clearance, requiring use of hemiwalker at this time for safety with gait.    OBJECTIVE IMPAIRMENTS Abnormal gait, decreased activity tolerance, decreased balance, decreased coordination, decreased knowledge of use of DME, decreased mobility, difficulty walking, decreased strength, impaired sensation, impaired tone, impaired UE functional use, and improper body mechanics.   ACTIVITY LIMITATIONS carrying, lifting, bending, standing, squatting, stairs, transfers, bathing, toileting, dressing, reach over head, and locomotion level  PARTICIPATION LIMITATIONS: meal prep, cleaning, laundry, interpersonal relationship, driving, shopping, community activity, occupation, yard work, and leisure activities  [place] Time since onset of injury/illness/exacerbation are also affecting patient's functional outcome.   REHAB POTENTIAL: Excellent  CLINICAL DECISION MAKING: Evolving/moderate complexity  EVALUATION COMPLEXITY: Moderate  PLAN: PT FREQUENCY: 2x/week  PT DURATION: 8 weeks  PLANNED INTERVENTIONS: Therapeutic exercises, Therapeutic activity, Neuromuscular re-education, Balance training, Gait training, Patient/Family education, Self Care, Joint mobilization, Stair training, Vestibular training, Canalith repositioning, Orthotic/Fit training, DME instructions, Aquatic Therapy, Dry Needling, Electrical stimulation, Spinal mobilization,  Cryotherapy, Moist heat, Splintting, Taping, and Manual therapy  PLAN FOR NEXT SESSION: Continue gait training with AFO trial.  Continue with RLE step ups; use of mirror for R wt shift, RLE forced weightbearing, quadruped?   Jenniferlynn Saad Gerrit Friends,  PT 08/31/22 9:19 AM Phone: [phone] Fax: [phone]    [facility] at [facility] [place], [address], [place] [address] Phone # [phone] Fax # [phone]

## 2022-08-30 NOTE — Therapy (Unsigned)
OUTPATIENT OCCUPATIONAL THERAPY NEURO TREATMENT NOTE  Patient Name: Gordon Fisher MRN: [id number] DOB:[birth year], 61 y.o., male Today's Date: 08/30/2022  PCP: none on file REFERRING PROVIDER: Jennye Boroughs, MD    OT End of Session - 08/30/22 1408     Visit Number 8    Number of Visits 17    Date for OT Re-Evaluation 09/24/22    Authorization Type [employer]    Authorization Time Period VL: 60 combined (OT/PT/ST)    Authorization - Visit Number 8    Authorization - Number of Visits 20    OT Start Time 1406    OT Stop Time 1446    OT Time Calculation (min) 40 min    Activity Tolerance Patient tolerated treatment well    Behavior During Therapy WFL for tasks assessed/performed             Past Medical History:  Diagnosis Date   Stroke [facility])    Past Surgical History:  Procedure Laterality Date   BUBBLE STUDY  02/15/2022   Procedure: BUBBLE STUDY;  Surgeon: Werner Lean, MD;  Location: [place];  Service: Cardiovascular;;   IR CT HEAD LTD  02/11/2022   IR CT HEAD LTD  02/11/2022   IR CT HEAD LTD  02/11/2022   IR PERCUTANEOUS ART THROMBECTOMY/INFUSION INTRACRANIAL INC DIAG ANGIO  02/11/2022   IR PERCUTANEOUS ART THROMBECTOMY/INFUSION INTRACRANIAL INC DIAG ANGIO  02/11/2022   IR US GUIDE VASC ACCESS RIGHT  02/11/2022   IR US GUIDE VASC ACCESS RIGHT  02/11/2022   RADIOLOGY WITH ANESTHESIA N/A 02/11/2022   Procedure: IR WITH ANESTHESIA;  Surgeon: Radiologist, Medication, MD;  Location: [place];  Service: Radiology;  Laterality: N/A;   RADIOLOGY WITH ANESTHESIA N/A 02/11/2022   Procedure: RADIOLOGY WITH ANESTHESIA;  Surgeon: Radiologist, Medication, MD;  Location: [place];  Service: Radiology;  Laterality: N/A;   TEE WITHOUT CARDIOVERSION N/A 02/15/2022   Procedure: TRANSESOPHAGEAL ECHOCARDIOGRAM (TEE);  Surgeon: Werner Lean, MD;  Location: [facility] ENDOSCOPY;  Service: Cardiovascular;  Laterality: N/A;   Patient Active Problem List   Diagnosis Date  Noted   Hypocalcemia 03/22/2022   DVT, lower extremity, distal ([place]) 03/22/2022   Obesity 03/22/2022   Elevated LDL cholesterol level 03/22/2022   Acute ischemic left middle cerebral artery (MCA) stroke ([place]) 02/18/2022   Cerebrovascular accident (CVA) due to occlusion of left middle cerebral artery ([place]) 02/11/2022    ONSET DATE: referral 07/15/22 (CVA 02/11/22)  REFERRING DIAG: I63.9 (ICD-10-CM) - Cerebrovascular accident (CVA), unspecified mechanism ([place]) R13.10 (ICD-10-CM) - Dysphagia, unspecified type   THERAPY DIAG:  Hemiplegia and hemiparesis following cerebral infarction affecting right dominant side ([facility])  Other lack of coordination  Other disturbances of skin sensation  Muscle weakness (generalized)  Rationale for Evaluation and Treatment Rehabilitation  SUBJECTIVE:   SUBJECTIVE STATEMENT: Pt's wife reports that movement during shoulder shrugs is the most movement she has seen. Also reports a family member made pt a mirror that he can use for his exercises. Pt accompanied by: family member (wife, [name])  PERTINENT HISTORY: Ischemic L MCA CVA w/ residual R-sided hemiparesis 02/11/22; in relatively good health prior to onset  PRECAUTIONS: Fall  PAIN: Are you having pain? No  FALLS: Has patient fallen in last 6 months? Yes. Number of falls 1  PLOF: Independent, Independent with basic ADLs, and Independent with gait  PATIENT GOALS: continue to make gains, independence with getting dressed   OBJECTIVE:   HAND DOMINANCE: Right  FUNCTIONAL OUTCOME MEASURES: FOTO: 11  UPPER  EXTREMITY ROM    Active ROM: Pt with no AROM during evaluation.  Pt was able to elicit min shoulder elevation and slight gross grasp when cued and provided increased time to initiate movement.    Passive ROM Right Eval - 9/25  Shoulder flexion 100  Shoulder abduction   Shoulder adduction   Shoulder extension   Shoulder internal rotation WNL  Shoulder external rotation onset of pain  >5-10* from neutral  Middle trapezius   Lower trapezius   Elbow flexion WNL  Elbow extension WNL  Wrist flexion WNL  Wrist extension WNL  Wrist ulnar deviation   Wrist radial deviation   Wrist pronation   Wrist supination   (Blank rows = not tested)  HAND FUNCTION: No functional grasp. Trace gross grasp with increased time for initiation  SENSATION: Difficult to assess due to expressive aphasia  MUSCLE TONE: RUE: Mild and Hypertonic  ------------------------------------------------------------------------------------------------------------------------------------------------------------ (objective measures above completed at initial evaluation unless otherwise dated)  TODAY'S TREATMENT:  08/30/22 LB dressing with gait belt Scapular retraction with mirror RUE as stabilizer: utilizing RUE as stabilizer to gross assist with self-care tasks of opening containers, holding toothpaste or toothbrush, engaging in wiping table tops and pushing open doors with LUE over RUE.    08/25/22 UE Ranger: RUE shoulder flex/ext completed w/ UE Ranger for NMR; OT provided active assist during first set w/ verbal and tactile cues to increase activation w/ neutral results. 2nd set completed w/ elbow flexed slightly past 90 deg, tall mirror in front of pt, and w/ vibration to anterior deltoid and posterior shoulder during flex or ext respectively w/ positive results. Shoulder shrugs: bilateral shoulder elevation completed in front of tall mirror for visual feedback w/ OT providing verbal/tactile cues and applying vibration to facilitate muscle activation. Completed 3x10 w/ success improving w/ incr sets. Scapular retraction: bilateral retraction completed in front of tall mirror w/ OT providing support and facilitate at R shoulder blade for incr mobility, as well as tactile cues for consistent arc of motion Dressing: pt able to don open-front long-sleeve shirt w/ SPV, requiring extended time for  success. Also discussed participation when donning bottoms, problem-solving w/ pt and his wife to determine strategies for increased independence. OT demonstrated cross-leg method w/out AD step-by-step w/ pt verbalizing understanding.   08/23/22 Supine exercises: OT facilitated R shoulder ROM exercises in supine for improved scapular positioning and stability.  Attempted abduction/adduction, elbow flexion/extension, and shoulder flexion.  OT facilitated PROM and self-ROM with cues for hand placement to increase facilitation vs dependent mobilization.  Pt able to elicit elbow flexion/extension against gravity and in gravity minimized position with mod cues from OT for visual attention to UE and tactile cues to minimize compensatory strategies. Sidelying exercises: OT facilitated towel slides for R shoulder flexion/extension and elbow flexion/extension while in sidelying.  Pt benefiting from manual facilitation to fading intermittent tactile cues at UE, consistent tactile cues to decrease compensatory trunk movements.  OT providing verbal cues for visual attention to task.  Pt demonstrating min activation of elbow flexion/extension and trace shoulder movements.   PATIENT EDUCATION: Ongoing condition-specific education related to therapeutic interventions completed this session Person educated: Patient and Spouse Education method: Explanation, Demonstration, Tactile cues, Verbal cues, and Handouts Education comprehension: verbalized understanding and needs further education   HOME EXERCISE PROGRAM: Written exercises of shoulder and elbow in supine with cues for hand placement and facilitation.  MedBridge Access Code: [email or web address] URL: [email or web address] - Supine Shoulder Flexion AAROM with Hands Clasped  - 1  x daily - 1 sets - 10 reps - Seated Elbow Extension and Shoulder External Rotation AAROM at Table with Towel  - 1 x daily - 1 sets - 10 reps - Finger Extension with Wrist  Extension Caregiver PROM  - 1 x daily - 1 sets - 10 reps  GOALS: Goals reviewed with patient? Yes  SHORT TERM GOALS: Target date: 08/27/22  Pt and spouse will be independent with PROM and self-ROM HEP for shoulder stability and ROM. Baseline: Goal status: MET - 08/30/22  2.  Pt will verbalize understanding of task modifications and/or potential A/E needs to increase ease, safety, and independence w/ ADLs Baseline:  Goal status: MET -08/30/22  3.  Pt will verbalize understanding of strategies and use of RUE as stabilizer during ADLs. Baseline:  Goal status: MET - 08/30/22   LONG TERM GOALS: Target date: 09/24/22  Pt will demonstrate ability to complete UB dressing with supervision with use of hemi-dressing technique. Baseline:  Goal status: IN PROGRESS  2.  Pt will demonstrate improved awareness of and positioning of RUE during mobility and self-care tasks. Baseline:  Goal status: IN PROGRESS  3.  Pt will utilize RUE as gross assist during grooming tasks (such as washing hands and applying toothpaste). Baseline:  Goal status: IN PROGRESS  4.  Pt will verbalize and or demonstrate increased functional use of BUE during LB dressing to complete at supervision level. Baseline:  Goal status: IN PROGRESS  5.  Pt will verbalize improved functional incorporation of RUE with ADLs by reporting improved FOTO score to >/= to 20. Baseline: 11 Goal status: IN PROGRESS   ASSESSMENT:  CLINICAL IMPRESSION: Session today w/ focus on continued NMR of RUE, incorporating shoulder flex/ext w/ gravity minimized, as well as scapular activation and stability exercises. Success w/ shoulder shrugs notably improved w/ visual feedback provided by mirror in front of pt and w/ repetition. Continued difficulty w/ shoulder AROM; R shoulder subluxation likely contributing to limitations. OT also addressed dressing strategies, focusing on hemi techniques for increased independence; pt encouraged to try  strategies at home and problem-solve w/ OT in upcoming sessions as needed.  PERFORMANCE DEFICITS in functional skills including ADLs, IADLs, coordination, sensation, tone, ROM, strength, pain, flexibility, FMC, GMC, mobility, balance, body mechanics, vision, and UE functional use and psychosocial skills including environmental adaptation and routines and behaviors.   IMPAIRMENTS are limiting patient from ADLs and IADLs.   COMORBIDITIES may have co-morbidities  that affects occupational performance. Patient will benefit from skilled OT to address above impairments and improve overall function.   PLAN: OT FREQUENCY: 2x/week  OT DURATION: 8 weeks  PLANNED INTERVENTIONS: self care/ADL training, therapeutic exercise, therapeutic activity, neuromuscular re-education, manual therapy, passive range of motion, balance training, functional mobility training, splinting, electrical stimulation, compression bandaging, moist heat, cryotherapy, patient/family education, visual/perceptual remediation/compensation, psychosocial skills training, energy conservation, coping strategies training, and DME and/or AE instructions  RECOMMENDED OTHER SERVICES: N/A  CONSULTED AND AGREED WITH PLAN OF CARE: Patient and family member/caregiver  PLAN FOR NEXT SESSION: Continue w/ scapular stability: shoulder shrugs (print pt instructions from session on 10/25), rolls, and scapular retraction; Practice cross-leg strategy for LB dressing   Yasuko Lapage, [place], OTR/L 08/30/2022, 2:08 PM

## 2022-09-01 ENCOUNTER — Ambulatory Visit: Payer: 59 | Attending: Physical Medicine & Rehabilitation | Admitting: Occupational Therapy

## 2022-09-01 ENCOUNTER — Ambulatory Visit: Payer: 59

## 2022-09-01 DIAGNOSIS — I69351 Hemiplegia and hemiparesis following cerebral infarction affecting right dominant side: Secondary | ICD-10-CM

## 2022-09-01 DIAGNOSIS — R262 Difficulty in walking, not elsewhere classified: Secondary | ICD-10-CM

## 2022-09-01 DIAGNOSIS — R2689 Other abnormalities of gait and mobility: Secondary | ICD-10-CM | POA: Diagnosis present

## 2022-09-01 DIAGNOSIS — R482 Apraxia: Secondary | ICD-10-CM | POA: Diagnosis present

## 2022-09-01 DIAGNOSIS — R278 Other lack of coordination: Secondary | ICD-10-CM

## 2022-09-01 DIAGNOSIS — R4701 Aphasia: Secondary | ICD-10-CM | POA: Insufficient documentation

## 2022-09-01 DIAGNOSIS — R2681 Unsteadiness on feet: Secondary | ICD-10-CM | POA: Diagnosis present

## 2022-09-01 DIAGNOSIS — M6281 Muscle weakness (generalized): Secondary | ICD-10-CM | POA: Diagnosis present

## 2022-09-01 DIAGNOSIS — R208 Other disturbances of skin sensation: Secondary | ICD-10-CM | POA: Diagnosis present

## 2022-09-01 NOTE — Therapy (Signed)
OUTPATIENT PHYSICAL THERAPY NEURO TREATMENT   Patient Name: Gordon Fisher MRN: [id number] DOB:[birth year], 61 y.o., male Today's Date: 09/01/2022   PCP: None listed REFERRING PROVIDER: Jennye Boroughs, MD    PT End of Session - 09/01/22 1320     Visit Number 9    Number of Visits 16    Date for PT Re-Evaluation 09/20/22    Authorization Type [employer] [id number]    Authorization - Visit Number 9    Authorization - Number of Visits 20    Progress Note Due on Visit 10    PT Start Time 1315    PT Stop Time 1400    PT Time Calculation (min) 45 min    Equipment Utilized During Treatment Gait belt    Activity Tolerance Patient tolerated treatment well    Behavior During Therapy WFL for tasks assessed/performed                Past Medical History:  Diagnosis Date   Stroke [facility]    Past Surgical History:  Procedure Laterality Date   BUBBLE STUDY  02/15/2022   Procedure: BUBBLE STUDY;  Surgeon: Werner Lean, MD;  Location: [place];  Service: Cardiovascular;;   IR CT HEAD LTD  02/11/2022   IR CT HEAD LTD  02/11/2022   IR CT HEAD LTD  02/11/2022   IR PERCUTANEOUS ART THROMBECTOMY/INFUSION INTRACRANIAL INC DIAG ANGIO  02/11/2022   IR PERCUTANEOUS ART THROMBECTOMY/INFUSION INTRACRANIAL INC DIAG ANGIO  02/11/2022   IR US GUIDE VASC ACCESS RIGHT  02/11/2022   IR US GUIDE VASC ACCESS RIGHT  02/11/2022   RADIOLOGY WITH ANESTHESIA N/A 02/11/2022   Procedure: IR WITH ANESTHESIA;  Surgeon: Radiologist, Medication, MD;  Location: [place];  Service: Radiology;  Laterality: N/A;   RADIOLOGY WITH ANESTHESIA N/A 02/11/2022   Procedure: RADIOLOGY WITH ANESTHESIA;  Surgeon: Radiologist, Medication, MD;  Location: [place];  Service: Radiology;  Laterality: N/A;   TEE WITHOUT CARDIOVERSION N/A 02/15/2022   Procedure: TRANSESOPHAGEAL ECHOCARDIOGRAM (TEE);  Surgeon: Werner Lean, MD;  Location: [facility] ENDOSCOPY;  Service: Cardiovascular;  Laterality: N/A;   Patient Active Problem List    Diagnosis Date Noted   Hypocalcemia 03/22/2022   DVT, lower extremity, distal ([place]) 03/22/2022   Obesity 03/22/2022   Elevated LDL cholesterol level 03/22/2022   Acute ischemic left middle cerebral artery (MCA) stroke ([place]) 02/18/2022   Cerebrovascular accident (CVA) due to occlusion of left middle cerebral artery ([place]) 02/11/2022    ONSET DATE: April 2023  REFERRING DIAG: I63.9 (ICD-10-CM) - Cerebrovascular accident (CVA), unspecified mechanism ([place]) R13.10 (ICD-10-CM) - Dysphagia, unspecified type   THERAPY DIAG:  Hemiplegia and hemiparesis following cerebral infarction affecting right dominant side (HCC)  Muscle weakness (generalized)  Other abnormalities of gait and mobility  Unsteadiness on feet  Difficulty in walking, not elsewhere classified  Rationale for Evaluation and Treatment Rehabilitation  SUBJECTIVE:  SUBJECTIVE STATEMENT: Nothing new since last visit.  Had orthotic assessment and will get an off-the-shelf item for AFO-expecting around 11/20 Pt accompanied by: significant other  PERTINENT HISTORY: 02/11/2022 patient developed right-sided weakness, aphasia, went to hospital for evaluation.  Code stroke was activated.  He received TNK and then transferred to [facility].  He underwent cerebral angiogram with thrombectomy.  Unfortunately vessel reoccluded later the day and the second thrombectomy was attempted.  Left M2 dissection was identified and treated with stent but unfortunately stent demonstrated reocclusion   PAIN:  Are you having pain? No  PRECAUTIONS: Shoulder and Fall, Aphasia   WEIGHT BEARING RESTRICTIONS No  FALLS: Has patient fallen in last 6 months? Yes. Number of falls 1, required 3-assist to recover  LIVING ENVIRONMENT: Lives with: lives with their  spouse Lives in: House/apartment Stairs: Yes: Internal: flight steps; bilateral but cannot reach both and External: 4-5 steps; ramp is also present.  Has following equipment at home: Hemi walker, Wheelchair (manual), and sit-stand lift  PLOF: Independent  PATIENT GOALS regain independence  OBJECTIVE:   TODAY'S TREATMENT: 09/01/22 Activity Comments  Stair training 3x12 RLE on bottom step, therapist facilitating reciprocal movement by contact right knee extension  LLE on 12" step BUE support on HR -therapist facilitating right knee extension/guarding: EO/EC x 15 sec, head turns 5x EO/EC  Standing heel raise 2x10 Therapist facilitating RLE for active plantarflexion  Right AFO PLS applied -RLE stair taps 3x10 reps to 12" step with therapist facilitating/assist hip flexion  Gait training w/ R PLS AFO and NBQC -sidestepping 4x20 ft  -retrowalking 4x20 ft -gait for speed 2 x 85 ft  Gait speed: 10 meter walk 1) 23.75 sec= 1.38 ft/sec 2) 24.37 sec = 1.34 ft/sec     TODAY'S TREATMENT: 08/30/2022 Activity Comments  Gait training with trial PLS AFO on R foot: Using hemiwalker 85 ft x 2 reps Therapist in front of patient and pt's L arm on PT's shoulder, 85 ft x 2 reps, with tactile cues for RLE stance time, VCs for LLE longer stride Use of SBQC, 85 ft x 3 reps, with improved step through pattern of gait  facilitation and cues for increased weightshift, weightbearing and stance time on RLE, for improved step length LLE  NMR for RLE with RLE as stance, LLE step forward/back, 2 x 10 reps Wearing R AFO                       HOME EXERCISE PROGRAM Last updated: 08/18/22 Access Code: [email or web address] URL: [email or web address] Date: 08/18/2022 Prepared by: [facility]  Exercises - Forward Backward Weight Shift with Counter Support  - 1 x daily - 5 x weekly - 2 sets - 10 reps - Side to Side Weight Shift with Counter Support  - 1 x daily - 5 x  weekly - 2 sets - 10 reps - Mini Squat with Counter Support  - 1 x daily - 5 x weekly - 2 sets - 10 reps - Stride Stance Weight Shift  - 1 x daily - 5 x weekly - 2 sets - 10 reps    Below measures were taken at time of initial evaluation unless otherwise specified:     DIAGNOSTIC FINDINGS: Follow-up MRI brain done revealing small areas of acute infarct in left-MCA territory with mild hemorrhagic transformation in left parietal lobe and left-MCA bifurcation residual vascular thrombus   COGNITION: Overall cognitive status:  Difficult to assess  due to aphasia   SENSATION: Light touch: Impaired  Proprioception: Impaired   COORDINATION: RLE impaired, some deficits on left foot with alternating movements and isolated movements  EDEMA:  None   MUSCLE TONE: RLE: Modifed Ashworth Scale 2 = More marked increase in muscle tone through most of the ROM, but affected part(s) easily moved right quad most affected, no clonus right plantarflexors   MUSCLE LENGTH: Able to achieve full right knee extension to PROM  DTRs:  Patella 3+ = Brisk  POSTURE: No Significant postural limitations  LOWER EXTREMITY ROM:     LLE WNL, RLE AROM limited by weakness  LOWER EXTREMITY MMT:    MMT Right Eval Left Eval  Hip flexion 3- 5  Hip extension    Hip abduction 3- 4  Hip adduction 3- 5  Hip internal rotation    Hip external rotation    Knee flexion 3 5  Knee extension 2+ 5  Ankle dorsiflexion 0 5  Ankle plantarflexion 1 3+  Ankle inversion    Ankle eversion    (Blank rows = not tested)  BED MOBILITY:  NT  TRANSFERS: Assistive device utilized: Hemi walker  Sit to stand: Modified independence Stand to sit: SBA Chair to chair: SBA and CGA Floor:  Total assist  RAMP:  Level of Assistance: CGA Assistive device utilized: Hemi walker Ramp Comments:   CURB:  Level of Assistance: Min A Assistive device utilized: Nutritional therapist Comments: cues in sequence  STAIRS:  Level of  Assistance: CGA and Min A  Stair Negotiation Technique: Step to Pattern with Single Rail on Left  Number of Stairs: 10   Height of Stairs: 4-6"  Comments: cues and guarding  GAIT: Gait pattern: step to pattern, decreased ankle dorsiflexion- Right, and circumduction- Right Distance walked: 82 Assistive device utilized: Hemi walker Level of assistance: CGA Comments: deficits during turning  FUNCTIONAL TESTs:  5 times sit to stand: 11.59 sec with 3 retro LOB and poor eccentric control Timed up and go (TUG): 34.75 sec Berg Balance Scale: 32/56 Dynamic Gait Index: NT 2 minute walk test: 82 ft w/ hemi-walker, 0.68 ft/sec  M-CTSIB  Condition 1: Firm Surface, EO 30 Sec, Normal and Mild Sway  Condition 2: Firm Surface, EC 30 Sec, Mild and Moderate Sway  Condition 3: Foam Surface, EO 30 Sec, Mild Sway  Condition 4: Foam Surface, EC 26 Sec, Moderate and Severe Sway     PATIENT SURVEYS:  FOTO 47%  TODAY'S TREATMENT:  assessment   PATIENT EDUCATION: Education details: assessment findings, PT scope of practice Person educated: Patient and Spouse Education method: Explanation Education comprehension: verbalized understanding   HOME EXERCISE PROGRAM: TBD    GOALS: Goals reviewed with patient? Yes  SHORT TERM GOALS: Target date: 08/23/2022  Patient will perform HEP with family/caregiver supervision for improved strength, balance, transfers, and gait  Baseline: Goal status: GOAL MET, 08/23/2022  2.  Ambulate 12 stairs with left HR and supervision to improve safety within home Baseline: CGA-min A; per report CGA/supervision 6 steps Goal status: PARTIALLY MET, 08/23/2022  3.  Demonstrate improved safety and efficiency with ambulation per distance of 100 ft during 2MWT Baseline: 82 ft w/ HW and CGA; 111 ft 08/23/2022 Goal status: GOAL MET, 08/23/2022    LONG TERM GOALS: Target date: 09/20/2022  Demonstrate improved balance and reduced risk for falls per score 40/56 Berg  Balance Test Baseline: 32/56 Goal status:IN PROGRESS  2.  Demonstrate modified independent ambulation x 150 ft level surfaces using least  restrictive AD in order to improve safety with home environment Baseline: CGA w/ HW Goal status: IN PROGRESS  3.  Manifest improved safety with gait per time of 18 sec TUG test using least restrictive AD Baseline: 34.75 sec w/ HW Goal status: IN PROGRESS    ASSESSMENT:  CLINICAL IMPRESSION: Demo marked improved in gait speed, pattern, and stability with right AFO vs without as evidenced by 10 meter walk speed of 0.68 ft/sec without AFO and 1.38 ft/sec with use of device. Impetus of treatment today focus on RLE control and coordination in stance phase with therapist facilitation of right knee extension and abduction for improved single limb support. Demonstrating global improvements in RLE control and strength with appreciation of active gastroc control evident by palpation and ability to perform reciprocal limb advancement in stair training today with CGA-min A in facilitation to right knee. Continues to demo right hip flexor weakness with volitional control of 2/5. Continued sessions to advance POC details and improve safety with functional mobility.  OBJECTIVE IMPAIRMENTS Abnormal gait, decreased activity tolerance, decreased balance, decreased coordination, decreased knowledge of use of DME, decreased mobility, difficulty walking, decreased strength, impaired sensation, impaired tone, impaired UE functional use, and improper body mechanics.   ACTIVITY LIMITATIONS carrying, lifting, bending, standing, squatting, stairs, transfers, bathing, toileting, dressing, reach over head, and locomotion level  PARTICIPATION LIMITATIONS: meal prep, cleaning, laundry, interpersonal relationship, driving, shopping, community activity, occupation, yard work, and leisure activities  [place] Time since onset of injury/illness/exacerbation are also affecting  patient's functional outcome.   REHAB POTENTIAL: Excellent  CLINICAL DECISION MAKING: Evolving/moderate complexity  EVALUATION COMPLEXITY: Moderate  PLAN: PT FREQUENCY: 2x/week  PT DURATION: 8 weeks  PLANNED INTERVENTIONS: Therapeutic exercises, Therapeutic activity, Neuromuscular re-education, Balance training, Gait training, Patient/Family education, Self Care, Joint mobilization, Stair training, Vestibular training, Canalith repositioning, Orthotic/Fit training, DME instructions, Aquatic Therapy, Dry Needling, Electrical stimulation, Spinal mobilization, Cryotherapy, Moist heat, Splintting, Taping, and Manual therapy  PLAN FOR NEXT SESSION: Continue gait training with AFO trial.  Continue with RLE step ups; use of mirror for R wt shift, RLE forced weightbearing, quadruped?   Mady Haagensen, PT 09/01/22 1:20 PM Phone: [phone] Fax: [phone]    [facility] at [facility] [address] [place], [place] [address] Phone # [phone] Fax # [phone]

## 2022-09-01 NOTE — Therapy (Signed)
OUTPATIENT OCCUPATIONAL THERAPY NEURO TREATMENT NOTE  Patient Name: Gordon Fisher MRN: [id number] DOB:[birth year], 61 y.o., male Today's Date: 09/01/2022  PCP: none on file REFERRING PROVIDER: Jennye Boroughs, MD    OT End of Session - 09/01/22 1402     Visit Number 9    Number of Visits 17    Date for OT Re-Evaluation 09/24/22    Authorization Type [employer]    Authorization Time Period VL: 60 combined (OT/PT/ST)    Authorization - Visit Number 9    Authorization - Number of Visits 20    OT Start Time 8299    OT Stop Time 1445    OT Time Calculation (min) 43 min    Activity Tolerance Patient tolerated treatment well    Behavior During Therapy WFL for tasks assessed/performed              Past Medical History:  Diagnosis Date   Stroke [facility])    Past Surgical History:  Procedure Laterality Date   BUBBLE STUDY  02/15/2022   Procedure: BUBBLE STUDY;  Surgeon: Werner Lean, MD;  Location: [place];  Service: Cardiovascular;;   IR CT HEAD LTD  02/11/2022   IR CT HEAD LTD  02/11/2022   IR CT HEAD LTD  02/11/2022   IR PERCUTANEOUS ART THROMBECTOMY/INFUSION INTRACRANIAL INC DIAG ANGIO  02/11/2022   IR PERCUTANEOUS ART THROMBECTOMY/INFUSION INTRACRANIAL INC DIAG ANGIO  02/11/2022   IR US GUIDE VASC ACCESS RIGHT  02/11/2022   IR US GUIDE VASC ACCESS RIGHT  02/11/2022   RADIOLOGY WITH ANESTHESIA N/A 02/11/2022   Procedure: IR WITH ANESTHESIA;  Surgeon: Radiologist, Medication, MD;  Location: [place];  Service: Radiology;  Laterality: N/A;   RADIOLOGY WITH ANESTHESIA N/A 02/11/2022   Procedure: RADIOLOGY WITH ANESTHESIA;  Surgeon: Radiologist, Medication, MD;  Location: [place];  Service: Radiology;  Laterality: N/A;   TEE WITHOUT CARDIOVERSION N/A 02/15/2022   Procedure: TRANSESOPHAGEAL ECHOCARDIOGRAM (TEE);  Surgeon: Werner Lean, MD;  Location: [facility] ENDOSCOPY;  Service: Cardiovascular;  Laterality: N/A;   Patient Active Problem List   Diagnosis Date  Noted   Hypocalcemia 03/22/2022   DVT, lower extremity, distal ([place]) 03/22/2022   Obesity 03/22/2022   Elevated LDL cholesterol level 03/22/2022   Acute ischemic left middle cerebral artery (MCA) stroke ([place]) 02/18/2022   Cerebrovascular accident (CVA) due to occlusion of left middle cerebral artery ([place]) 02/11/2022    ONSET DATE: referral 07/15/22 (CVA 02/11/22)  REFERRING DIAG: I63.9 (ICD-10-CM) - Cerebrovascular accident (CVA), unspecified mechanism ([place]) R13.10 (ICD-10-CM) - Dysphagia, unspecified type   THERAPY DIAG:  Hemiplegia and hemiparesis following cerebral infarction affecting right dominant side (HCC)  Other lack of coordination  Other disturbances of skin sensation  Muscle weakness (generalized)  Other abnormalities of gait and mobility  Unsteadiness on feet  Rationale for Evaluation and Treatment Rehabilitation  SUBJECTIVE:   SUBJECTIVE STATEMENT: Pt's wife reports that he made a grilled cheese yesterday. Pt accompanied by: family member (wife, [name])  PERTINENT HISTORY: Ischemic L MCA CVA w/ residual R-sided hemiparesis 02/11/22; in relatively good health prior to onset  PRECAUTIONS: Fall  PAIN: Are you having pain? No  FALLS: Has patient fallen in last 6 months? Yes. Number of falls 1  PLOF: Independent, Independent with basic ADLs, and Independent with gait  PATIENT GOALS: continue to make gains, independence with getting dressed   OBJECTIVE:   HAND DOMINANCE: Right  FUNCTIONAL OUTCOME MEASURES: FOTO: 11  UPPER EXTREMITY ROM    Active ROM: Pt with  no AROM during evaluation.  Pt was able to elicit min shoulder elevation and slight gross grasp when cued and provided increased time to initiate movement.    Passive ROM Right Eval - 9/25  Shoulder flexion 100  Shoulder abduction   Shoulder adduction   Shoulder extension   Shoulder internal rotation WNL  Shoulder external rotation onset of pain >5-10* from neutral  Middle trapezius   Lower  trapezius   Elbow flexion WNL  Elbow extension WNL  Wrist flexion WNL  Wrist extension WNL  Wrist ulnar deviation   Wrist radial deviation   Wrist pronation   Wrist supination   (Blank rows = not tested)  HAND FUNCTION: No functional grasp. Trace gross grasp with increased time for initiation  SENSATION: Difficult to assess due to expressive aphasia  MUSCLE TONE: RUE: Mild and Hypertonic  ------------------------------------------------------------------------------------------------------------------------------------------------------------ (objective measures above completed at initial evaluation unless otherwise dated)  TODAY'S TREATMENT:  09/01/22 UE Ranger: Engaged in shoulder flexion/extension and horizontal abduction/adduction with UE Ranger.  OT providing tactile support and facilitation at elbow to maintain good shoulder positioning.  OT modified task to completing with hemi-walker on 2 legs.  OT initially facilitating by placing hand over hand to maintain sustained grasp, transitioning to providing support at R elbow to facilitate approximation of R shoulder joint.  Pt able to complete flexion/extension with hemi-walker. NMR/WB through RUE: standing with WB through R hand and then over half ball on table due to tone in fingers with extension/WB.  Engaged in reaching for cards with LUE across midline to further facilitate WB through RUE, OT providing positioning to further facilitate WB.  Incorporated cognitive challenge to matching cards based on number, pt requiring mod cues for attention to task and correct sorting.   Dynamic standing: OT facilitating weight shifting through hips with exaggerated mobility with bumping OT hip on R and wife hip on L to increase weight shifting and acceptance of WB through RLE while in standing.  Discussed functional carryover of importance of weight shifting.  Utilized Geologist, engineering for [employer] for weight shifting during blocked practice and  when transitioning back to card sorting activity.    08/30/22 LB dressing: engaged in simulated LB dressing with gait belt progressing to use of trash bag with bottom cut off.  OT demonstrated LB dressing with use of reacher, figure 4 positioning, and RLE crossed at ankle over LLE.  Pt demonstrating most success with ankles crossed and ability to reach forward to thread RLE and then LLE.  Pt able to stand and pull pants over hips with CGA to close supervision.  OT educated on recommendation for sitting as pt attempted to doff pants in standing.  Discussed seat height as well to facilitate increased reach. Scapular retraction/shoulder shrugs: utilized Geologist, engineering for Warden/ranger.  OT providing support and facilitation as well as tactile cues and/or tapping to further facilitate muscle activation.   RUE as stabilizer: Reiterated utilizing RUE as stabilizer to gross assist with self-care tasks.  Pt's spouse reports that he is utilizing RUE when opening containers, holding toothpaste or toothbrush, engaging in wiping table tops and pushing open doors with LUE over RUE.    08/25/22 UE Ranger: RUE shoulder flex/ext completed w/ UE Ranger for NMR; OT provided active assist during first set w/ verbal and tactile cues to increase activation w/ neutral results. 2nd set completed w/ elbow flexed slightly past 90 deg, tall mirror in front of pt, and w/ vibration to anterior deltoid and posterior shoulder during  flex or ext respectively w/ positive results. Shoulder shrugs: bilateral shoulder elevation completed in front of tall mirror for visual feedback w/ OT providing verbal/tactile cues and applying vibration to facilitate muscle activation. Completed 3x10 w/ success improving w/ incr sets. Scapular retraction: bilateral retraction completed in front of tall mirror w/ OT providing support and facilitate at R shoulder blade for incr mobility, as well as tactile cues for consistent arc of motion Dressing: pt able  to don open-front long-sleeve shirt w/ SPV, requiring extended time for success. Also discussed participation when donning bottoms, problem-solving w/ pt and his wife to determine strategies for increased independence. OT demonstrated cross-leg method w/out AD step-by-step w/ pt verbalizing understanding.   PATIENT EDUCATION: Ongoing condition-specific education related to therapeutic interventions completed this session Person educated: Patient and Spouse Education method: Explanation, Demonstration, Tactile cues, Verbal cues, and Handouts Education comprehension: verbalized understanding and needs further education   HOME EXERCISE PROGRAM: Written exercises of shoulder and elbow in supine with cues for hand placement and facilitation.  MedBridge Access Code: [email or web address] URL: [email or web address] - Supine Shoulder Flexion AAROM with Hands Clasped  - 1 x daily - 1 sets - 10 reps - Seated Elbow Extension and Shoulder External Rotation AAROM at Table with Towel  - 1 x daily - 1 sets - 10 reps - Finger Extension with Wrist Extension Caregiver PROM  - 1 x daily - 1 sets - 10 reps  GOALS: Goals reviewed with patient? Yes  SHORT TERM GOALS: Target date: 08/27/22  Pt and spouse will be independent with PROM and self-ROM HEP for shoulder stability and ROM. Baseline: Goal status: MET - 08/30/22  2.  Pt will verbalize understanding of task modifications and/or potential A/E needs to increase ease, safety, and independence w/ ADLs Baseline:  Goal status: MET -08/30/22  3.  Pt will verbalize understanding of strategies and use of RUE as stabilizer during ADLs. Baseline:  Goal status: MET - 08/30/22   LONG TERM GOALS: Target date: 09/24/22  Pt will demonstrate ability to complete UB dressing with supervision with use of hemi-dressing technique. Baseline:  Goal status: IN PROGRESS  2.  Pt will demonstrate improved awareness of and positioning of RUE during mobility and  self-care tasks. Baseline:  Goal status: IN PROGRESS  3.  Pt will utilize RUE as gross assist during grooming tasks (such as washing hands and applying toothpaste). Baseline:  Goal status: IN PROGRESS  4.  Pt will verbalize and or demonstrate increased functional use of BUE during LB dressing to complete at supervision level. Baseline:  Goal status: IN PROGRESS  5.  Pt will verbalize improved functional incorporation of RUE with ADLs by reporting improved FOTO score to >/= to 20. Baseline: 11 Goal status: IN PROGRESS   ASSESSMENT:  CLINICAL IMPRESSION: Session today with focus on continued NMR of RUE, incorporating shoulder flex/ext with gravity minimized and against mild resistance.  Continued difficulty with shoulder AROM; R shoulder subluxation likely contributing to limitations. Pt responding well to use of mirror for visual feedback with UE NMR as well as with weight shifting in standing.  PERFORMANCE DEFICITS in functional skills including ADLs, IADLs, coordination, sensation, tone, ROM, strength, pain, flexibility, FMC, GMC, mobility, balance, body mechanics, vision, and UE functional use and psychosocial skills including environmental adaptation and routines and behaviors.   IMPAIRMENTS are limiting patient from ADLs and IADLs.   COMORBIDITIES may have co-morbidities  that affects occupational performance. Patient will benefit from skilled OT to address above impairments and improve  overall function.   PLAN: OT FREQUENCY: 2x/week  OT DURATION: 8 weeks  PLANNED INTERVENTIONS: self care/ADL training, therapeutic exercise, therapeutic activity, neuromuscular re-education, manual therapy, passive range of motion, balance training, functional mobility training, splinting, electrical stimulation, compression bandaging, moist heat, cryotherapy, patient/family education, visual/perceptual remediation/compensation, psychosocial skills training, energy conservation, coping strategies  training, and DME and/or AE instructions  RECOMMENDED OTHER SERVICES: N/A  CONSULTED AND AGREED WITH PLAN OF CARE: Patient and family member/caregiver  PLAN FOR NEXT SESSION: Continue w/ scapular stability: shoulder shrugs (print pt instructions from session on 10/25), rolls, and scapular retraction; WB and dynamic standing, supine and sidelying NMR   HOXIE, SARAH, OTR/L 09/01/2022, 2:03 PM

## 2022-09-01 NOTE — Therapy (Signed)
OUTPATIENT SPEECH LANGUAGE PATHOLOGY TREATMENT   Patient Name: Gordon Fisher MRN: [id number] DOB:July 07, 1961, 61 y.o., male Today's Date: 09/01/2022  PCP: TBD REFERRING PROVIDER: Jennye Boroughs, MD    End of Session - 09/01/22 1239     Visit Number 7    Number of Visits 25    Date for SLP Re-Evaluation 10/24/22    Authorization - Visit Number 6    Authorization - Number of Visits 20    SLP Start Time 5956    SLP Stop Time  3875    SLP Time Calculation (min) 40 min    Activity Tolerance Patient tolerated treatment well                Past Medical History:  Diagnosis Date   Stroke [facility])    Past Surgical History:  Procedure Laterality Date   BUBBLE STUDY  02/15/2022   Procedure: BUBBLE STUDY;  Surgeon: Werner Lean, MD;  Location: [place];  Service: Cardiovascular;;   IR CT HEAD LTD  02/11/2022   IR CT HEAD LTD  02/11/2022   IR CT HEAD LTD  02/11/2022   IR PERCUTANEOUS ART THROMBECTOMY/INFUSION INTRACRANIAL INC DIAG ANGIO  02/11/2022   IR PERCUTANEOUS ART THROMBECTOMY/INFUSION INTRACRANIAL INC DIAG ANGIO  02/11/2022   IR US GUIDE VASC ACCESS RIGHT  02/11/2022   IR US GUIDE VASC ACCESS RIGHT  02/11/2022   RADIOLOGY WITH ANESTHESIA N/A 02/11/2022   Procedure: IR WITH ANESTHESIA;  Surgeon: Radiologist, Medication, MD;  Location: [place];  Service: Radiology;  Laterality: N/A;   RADIOLOGY WITH ANESTHESIA N/A 02/11/2022   Procedure: RADIOLOGY WITH ANESTHESIA;  Surgeon: Radiologist, Medication, MD;  Location: [place];  Service: Radiology;  Laterality: N/A;   TEE WITHOUT CARDIOVERSION N/A 02/15/2022   Procedure: TRANSESOPHAGEAL ECHOCARDIOGRAM (TEE);  Surgeon: Werner Lean, MD;  Location: [facility] ENDOSCOPY;  Service: Cardiovascular;  Laterality: N/A;   Patient Active Problem List   Diagnosis Date Noted   Hypocalcemia 03/22/2022   DVT, lower extremity, distal ([place]) 03/22/2022   Obesity 03/22/2022   Elevated LDL cholesterol level 03/22/2022   Acute ischemic  left middle cerebral artery (MCA) stroke ([place]) 02/18/2022   Cerebrovascular accident (CVA) due to occlusion of left middle cerebral artery ([place]) 02/11/2022    ONSET DATE: 02-11-22   REFERRING DIAG: I63.9 (ICD-10-CM) - Cerebrovascular accident (CVA), unspecified mechanism ([place]) R13.10 (ICD-10-CM) - Dysphagia, unspecified type   THERAPY DIAG:  No diagnosis found.  Rationale for Evaluation and Treatment Rehabilitation  SUBJECTIVE:   SUBJECTIVE STATEMENT: "We;ve been working a lot on words that are important to him. Sometimes I hear him practicing the grandkids names." Pt accompanied by: significant other  PERTINENT HISTORY: Gordon Fisher is a 61 year old male in relatively good health who was admitted via [facility] on 02/11/22 with right sided weakness, right facial droop and slurred speech. CTH showed L-MCA hyperdense sign and he received TNK prior to transfer. CTA showed L-MCA MI occlusion and he underwent cerebral angio with thrombectomy and TICI 3 flow by Dr. Nelida Gores [name].  Follow up MRI brain done revealing small areas of acute infarct infarct in L-MCA territory with mild hemorrhagic transformation in left parietal lobe and L-MCA bifurcation residual vascular thrombus. He was taken back for thrombectomy with stent for dissection but had intra-stent occlusion which was unable to be recanalized. Post op CT showed small SAH which was stable on follow up. He was started on ASA/Brillinta. MBS 04/18 showed oral dysphagia with oral delay and no penetration or aspiration.  He was started on D3, thins with supervision. Repeat MRI brain on 04/16 showed extensive evolving acute ischemic L-MCA occlusion increased in size with associated edema, few additional small acute cortical subcortical infarcts involving contralateral right frontal lobe and left ACA distribution. Stroke felt to be due to unknown etiology and wife elected on Zio patch on discharge to rule out A fib. Patient with resultant global  aphasia with limited verbal output, right inattention with right hemiplegia and knee instability on standing attempts. [name] was d/c'd from [facility] on 03-19-22, and has had HHST since that time until last week.   PAIN:  Are you having pain? No  PATIENT GOALS Pt indicated he would like talking to improve  OBJECTIVE:  DIAGNOSTIC FINDINGS: MRI without contrast 02/13/22 IMPRESSION: 1. Extensive evolving acute ischemic left MCA distribution infarct, increased in size as compared to previous MRI from 02/11/2022. Associated edema without significant midline shift. 2. Small volume acute subarachnoid hemorrhage at the posterior aspect of the left Sylvian fissure, stable. 3. Few additional small volume acute ischemic cortical and subcortical infarcts involving the contralateral right frontal lobe and left ACA distribution as above. 4. Underlying chronic microvascular ischemic disease.    PATIENT REPORTED OUTCOME MEASURES (PROM): Communication Effectiveness Survey: Pt scored 8/32 - higher scores indicate more effective communication/QOL.   TODAY'S TREATMENT:  09/01/22: SLP worked with pt's verbal expression and auditory comprehension today. Simple conversation/sentences were understood 90% of the time, 100% with one repeat by speaker. With opposites/paired words, pt had 70% success, improved to 90% with min A. With cloze phrases/sentences pt's response accuracy improved with 1 syllable words, but overall success was 17% with min A, improved to 50% with mod-max A. Often, pt knew the response but knew he was going to misarticulate the response. Written cues were helpful for pt here. SLP encouraged wife to cont with cloze phrases and paired words at home.  10/25/23[name] brought pt's cards today and SLP reviewed them with pt, modleing to wife cueing methods and hierarchy.Pt functionally said 6/7 salient objects (without written words) with mod-max cues, and 1/7 spontaneously. He said family names with written  cues 6/7 (functionally), and 1/7 wth max cues Janett Billow"). Pt demo'd understnading of directions in context 75% today. SLP introduce possibility of Lingraphica to pt and wife and educated about studies proving that AAC assists verbal communication and that pt may not have to have assistance with a AAC device but we will know more as time progresses.   08/23/22: SLP targeted verbal expression with pt today. Heavy perseveration with "[name]" grandson's name for all J names. SLP even did months of the year and pt cont to have perseveration with [name] for all J names. "[name]" - pt cont to make this into two syllables 90% of the time, with consistent max A. Pt used multi-modal communization using chicken clucks with SLP assistance and encouragement when pt could not say "chickens", and pointed to ages of his grandchildren with SLP writing numbers 1-10. Mod-max A needed for grandson 2 months old. Pt showed 1 and then gestured taking a short person's height for SLP to know young child. MAX A needed for month names > 1 syllable. With one syllable months pt 80% success. Pt told SLP name of his truck, and functionally told SLP name of one of his two tractors. Req'd mod/max A otherwise. Pt to cont to practice salient/high frequency/high importance words.   08/18/22: SLP targeted spelling today with pt - 3 letter words 100%, 4-letter words 80% -  [name] stated pt had s/sx dyslexia prior to CVA. SLP to monitor this. SLP provided some other hints/helpful suggestions for home practice and communication at home today. SLP noted pt read 3 and 4 letter words 50% accuracy when spelled correctly.   08/16/22: SLP assessed pt's reading comprehension - pt reading single words 6/6 and simple sentences of 6-9 words ("the boy is riding his bike in the park") 6/6. [name] said pt having difficulty reading newspaper from home. SLP did reading task (newspaper) with pt today. SLP highlighted key words for pt and he answered 4/6 questions correct  by pt answering yes/no and pointing to correct portion in the article to indicate place where the answer was found. Pt will return CES next session or two.    PATIENT EDUCATION: Education details: see "Today's therapy" Person educated: Patient and Spouse Education method: Explanation and Demonstration Education comprehension: verbalized understanding and needs further education   GOALS: Goals reviewed with patient? Yes, in general  SHORT TERM GOALS: Target date: 08/31/2022 extended one week due to visits  Pt will name 6 salient family names when given picture and written name with consistent mod-max cues in 3 sessions (precursor for speech generating device) Baseline:08-25-22. 09-01-22 Goal status: partially met  2.  Pt will produce simple phoneme/ bilabial+/a/  /a/, /ba/, /ma/, and/or /pa/ simultaneously with SLP 65% success (equal # reps/stimulus) in 2 sessions Baseline:  Goal status: deferred - pt to focus on salient items/terms  3.  Pt will demo understanding of simple commands in context 80% over three sessions Baseline: 08-25-22, 09-01-22 Goal status: Partially met  4.  Pt will indicate understanding of a complex sentence 85% of the time in 3 sessions (precursor for speech generating device) Baseline:  Goal status: not met  5.  Pt will copy written family names with 50% success in two sessions  Baseline:  Goal Status: met   LONG TERM GOALS: Target date: 10/24/2022  (or 20th visit)  Pt will name 6 salient family names when given picture and written name with consistent mod cues in 3 sessions (precursor for speech generating device) Baseline:  Goal status: Ongoing  2.  Pt will IMITATE simple phoneme/bilabial+/a/  /a/, /ba/, /ma/, and/or /pa/  65% success (equal # reps/stimulus) in 2 sessions Baseline:  Goal status: Ongoing  3.  Pt will generate meaningful message understood by [name] using speech generating device 60% of the time in 3 sessions Baseline:  Goal status:  Ongoing  4.  [name] will indicate that spontaneous communication is easier than during week of evaluation Baseline:  Goal status: Ongoing   ASSESSMENT:  CLINICAL IMPRESSION: Patient partially met STGs. He is a 61 y.o. male who cont to be seen today for treatment of aphasia, and verbal apraxia (as pt often indicates he knows the response but knows it will not be articulated correctly). SLP modified reading goal. SEE TX NOTE from today for more details.   OBJECTIVE IMPAIRMENTS include expressive language, receptive language, and aphasia. These impairments are limiting patient from return to work, managing medications, managing appointments, household responsibilities, ADLs/IADLs, and effectively communicating at home and in community. Factors affecting potential to achieve goals and functional outcome are ability to learn/carryover information and severity of impairments. Patient will benefit from skilled SLP services to address above impairments and improve overall function.  REHAB POTENTIAL: Good  PLAN: SLP FREQUENCY: 2x/week  SLP DURATION: 12 weeks  PLANNED INTERVENTIONS: Language facilitation, Environmental controls, Cueing hierachy, Internal/external aids, Functional tasks, and Multimodal communication approach  [place], [place] 09/01/2022, 12:39 PM

## 2022-09-06 ENCOUNTER — Ambulatory Visit: Payer: 59

## 2022-09-06 ENCOUNTER — Ambulatory Visit: Payer: 59 | Admitting: Occupational Therapy

## 2022-09-06 DIAGNOSIS — M6281 Muscle weakness (generalized): Secondary | ICD-10-CM

## 2022-09-06 DIAGNOSIS — R4701 Aphasia: Secondary | ICD-10-CM

## 2022-09-06 DIAGNOSIS — R482 Apraxia: Secondary | ICD-10-CM

## 2022-09-06 DIAGNOSIS — R262 Difficulty in walking, not elsewhere classified: Secondary | ICD-10-CM

## 2022-09-06 DIAGNOSIS — R2681 Unsteadiness on feet: Secondary | ICD-10-CM

## 2022-09-06 DIAGNOSIS — R2689 Other abnormalities of gait and mobility: Secondary | ICD-10-CM

## 2022-09-06 DIAGNOSIS — I69351 Hemiplegia and hemiparesis following cerebral infarction affecting right dominant side: Secondary | ICD-10-CM

## 2022-09-06 DIAGNOSIS — R278 Other lack of coordination: Secondary | ICD-10-CM

## 2022-09-06 DIAGNOSIS — R208 Other disturbances of skin sensation: Secondary | ICD-10-CM

## 2022-09-06 NOTE — Therapy (Signed)
OUTPATIENT SPEECH LANGUAGE PATHOLOGY TREATMENT   Patient Name: Gordon Fisher MRN: [id number] DOB:02-Oct-1961, 61 y.o., male Today's Date: 09/06/2022  PCP: TBD REFERRING PROVIDER: Jennye Boroughs, MD    End of Session - 09/06/22 1238     Visit Number 8    Number of Visits 25    Date for SLP Re-Evaluation 10/24/22    Authorization - Visit Number 8    Authorization - Number of Visits 20    SLP Start Time 6314    SLP Stop Time  9702    SLP Time Calculation (min) 41 min    Activity Tolerance Patient tolerated treatment well                Past Medical History:  Diagnosis Date   Stroke [facility])    Past Surgical History:  Procedure Laterality Date   BUBBLE STUDY  02/15/2022   Procedure: BUBBLE STUDY;  Surgeon: Werner Lean, MD;  Location: [place];  Service: Cardiovascular;;   IR CT HEAD LTD  02/11/2022   IR CT HEAD LTD  02/11/2022   IR CT HEAD LTD  02/11/2022   IR PERCUTANEOUS ART THROMBECTOMY/INFUSION INTRACRANIAL INC DIAG ANGIO  02/11/2022   IR PERCUTANEOUS ART THROMBECTOMY/INFUSION INTRACRANIAL INC DIAG ANGIO  02/11/2022   IR US GUIDE VASC ACCESS RIGHT  02/11/2022   IR US GUIDE VASC ACCESS RIGHT  02/11/2022   RADIOLOGY WITH ANESTHESIA N/A 02/11/2022   Procedure: IR WITH ANESTHESIA;  Surgeon: Radiologist, Medication, MD;  Location: [place];  Service: Radiology;  Laterality: N/A;   RADIOLOGY WITH ANESTHESIA N/A 02/11/2022   Procedure: RADIOLOGY WITH ANESTHESIA;  Surgeon: Radiologist, Medication, MD;  Location: [place];  Service: Radiology;  Laterality: N/A;   TEE WITHOUT CARDIOVERSION N/A 02/15/2022   Procedure: TRANSESOPHAGEAL ECHOCARDIOGRAM (TEE);  Surgeon: Werner Lean, MD;  Location: [facility] ENDOSCOPY;  Service: Cardiovascular;  Laterality: N/A;   Patient Active Problem List   Diagnosis Date Noted   Hypocalcemia 03/22/2022   DVT, lower extremity, distal ([place]) 03/22/2022   Obesity 03/22/2022   Elevated LDL cholesterol level 03/22/2022   Acute ischemic  left middle cerebral artery (MCA) stroke ([place]) 02/18/2022   Cerebrovascular accident (CVA) due to occlusion of left middle cerebral artery ([place]) 02/11/2022    ONSET DATE: 02-11-22   REFERRING DIAG: I63.9 (ICD-10-CM) - Cerebrovascular accident (CVA), unspecified mechanism ([place]) R13.10 (ICD-10-CM) - Dysphagia, unspecified type   THERAPY DIAG:  Verbal apraxia  Aphasia  Rationale for Evaluation and Treatment Rehabilitation  SUBJECTIVE:   SUBJECTIVE STATEMENT: "Since we saw you last it's been better with spontaneous words." Pt accompanied by: self  PERTINENT HISTORY: Gordon Fisher is a 61 year old male in relatively good health who was admitted via [facility] on 02/11/22 with right sided weakness, right facial droop and slurred speech. CTH showed L-MCA hyperdense sign and he received TNK prior to transfer. CTA showed L-MCA MI occlusion and he underwent cerebral angio with thrombectomy and TICI 3 flow by Dr. Nelida Gores [name].  Follow up MRI brain done revealing small areas of acute infarct infarct in L-MCA territory with mild hemorrhagic transformation in left parietal lobe and L-MCA bifurcation residual vascular thrombus. He was taken back for thrombectomy with stent for dissection but had intra-stent occlusion which was unable to be recanalized. Post op CT showed small SAH which was stable on follow up. He was started on ASA/Brillinta. MBS 04/18 showed oral dysphagia with oral delay and no penetration or aspiration. He was started on D3, thins with supervision. Repeat  MRI brain on 04/16 showed extensive evolving acute ischemic L-MCA occlusion increased in size with associated edema, few additional small acute cortical subcortical infarcts involving contralateral right frontal lobe and left ACA distribution. Stroke felt to be due to unknown etiology and wife elected on Zio patch on discharge to rule out A fib. Patient with resultant global aphasia with limited verbal output, right inattention with  right hemiplegia and knee instability on standing attempts. [name] was d/c'd from [facility] on 03-19-22, and has had HHST since that time until last week.   PAIN:  Are you having pain? No  PATIENT GOALS Pt indicated he would like talking to improve  OBJECTIVE:  DIAGNOSTIC FINDINGS: MRI without contrast 02/13/22 IMPRESSION: 1. Extensive evolving acute ischemic left MCA distribution infarct, increased in size as compared to previous MRI from 02/11/2022. Associated edema without significant midline shift. 2. Small volume acute subarachnoid hemorrhage at the posterior aspect of the left Sylvian fissure, stable. 3. Few additional small volume acute ischemic cortical and subcortical infarcts involving the contralateral right frontal lobe and left ACA distribution as above. 4. Underlying chronic microvascular ischemic disease.    PATIENT REPORTED OUTCOME MEASURES (PROM): Communication Effectiveness Survey: Pt scored 8/32 - higher scores indicate more effective communication/QOL.   TODAY'S TREATMENT:  09/06/22: SLP targeted verbal expression with picture naming and divergent naming. Pt notably demonstrates he knows the word/s he wants to say but is finding it difficult to pronounce them (verbal apraxia). Pt's expression improves with visual cues (oral cues from SLP for initial phoneme/sound) - 75% successful even without sound necessary - just visual cue for articulator placement.   09/01/22: SLP worked with pt's verbal expression and auditory comprehension today. Simple conversation/sentences were understood 90% of the time, 100% with one repeat by speaker. With opposites/paired words, pt had 70% success, improved to 90% with min A. With cloze phrases/sentences pt's response accuracy improved with 1 syllable words, but overall success was 17% with min A, improved to 50% with mod-max A. Often, pt knew the response but knew he was going to misarticulate the response. Written cues were helpful for pt here. SLP  encouraged wife to cont with cloze phrases and paired words at home.  10/25/23[name] brought pt's cards today and SLP reviewed them with pt, modleing to wife cueing methods and hierarchy.Pt functionally said 6/7 salient objects (without written words) with mod-max cues, and 1/7 spontaneously. He said family names with written cues 6/7 (functionally), and 1/7 wth max cues Janett Billow"). Pt demo'd understnading of directions in context 75% today. SLP introduce possibility of Lingraphica to pt and wife and educated about studies proving that AAC assists verbal communication and that pt may not have to have assistance with a AAC device but we will know more as time progresses.   08/23/22: SLP targeted verbal expression with pt today. Heavy perseveration with "[name]" grandson's name for all J names. SLP even did months of the year and pt cont to have perseveration with [name] for all J names. "[name]" - pt cont to make this into two syllables 90% of the time, with consistent max A. Pt used multi-modal communization using chicken clucks with SLP assistance and encouragement when pt could not say "chickens", and pointed to ages of his grandchildren with SLP writing numbers 1-10. Mod-max A needed for grandson 2 months old. Pt showed 1 and then gestured taking a short person's height for SLP to know young child. MAX A needed for month names > 1 syllable. With one syllable months  pt 80% success. Pt told SLP name of his truck, and functionally told SLP name of one of his two tractors. Req'd mod/max A otherwise. Pt to cont to practice salient/high frequency/high importance words.   08/18/22: SLP targeted spelling today with pt - 3 letter words 100%, 4-letter words 80% - [name] stated pt had s/sx dyslexia prior to CVA. SLP to monitor this. SLP provided some other hints/helpful suggestions for home practice and communication at home today. SLP noted pt read 3 and 4 letter words 50% accuracy when spelled correctly.   08/16/22:  SLP assessed pt's reading comprehension - pt reading single words 6/6 and simple sentences of 6-9 words ("the boy is riding his bike in the park") 6/6. [name] said pt having difficulty reading newspaper from home. SLP did reading task (newspaper) with pt today. SLP highlighted key words for pt and he answered 4/6 questions correct by pt answering yes/no and pointing to correct portion in the article to indicate place where the answer was found. Pt will return CES next session or two.    PATIENT EDUCATION: Education details: see "Today's therapy" Person educated: Patient and Spouse Education method: Explanation and Demonstration Education comprehension: verbalized understanding and needs further education   GOALS: Goals reviewed with patient? Yes, in general  SHORT TERM GOALS: Target date: 08/31/2022 extended one week due to visits  Pt will name 6 salient family names when given picture and written name with consistent mod-max cues in 3 sessions (precursor for speech generating device) Baseline:08-25-22. 09-01-22 Goal status: partially met  2.  Pt will produce simple phoneme/ bilabial+/a/  /a/, /ba/, /ma/, and/or /pa/ simultaneously with SLP 65% success (equal # reps/stimulus) in 2 sessions Baseline:  Goal status: deferred - pt to focus on salient items/terms  3.  Pt will demo understanding of simple commands in context 80% over three sessions Baseline: 08-25-22, 09-01-22 Goal status: Partially met  4.  Pt will indicate understanding of a complex sentence 85% of the time in 3 sessions (precursor for speech generating device) Baseline:  Goal status: not met  5.  Pt will copy written family names with 50% success in two sessions  Baseline:  Goal Status: met   LONG TERM GOALS: Target date: 10/24/2022  (or 20th visit)  Pt will name 6 salient family names when given picture and written name with consistent mod cues in 3 sessions (precursor for speech generating device) Baseline:  Goal  status: Ongoing  2.  Pt will IMITATE simple phoneme/bilabial+/a/  /a/, /ba/, /ma/, and/or /pa/  65% success (equal # reps/stimulus) in 2 sessions Baseline:  Goal status: Ongoing  3.  Pt will generate meaningful message understood by [name] using speech generating device 60% of the time in 3 sessions Baseline:  Goal status: Ongoing  4.  [name] will indicate that spontaneous communication is easier than during week of evaluation Baseline:  Goal status: Ongoing   ASSESSMENT:  CLINICAL IMPRESSION: Patient partially met STGs. He is a 61 y.o. male who cont to be seen today for treatment of verbal apraxia (as pt often indicates he knows the response but knows it will not be articulated correctly) and aphasia. SEE TX NOTE from today for more details.   OBJECTIVE IMPAIRMENTS include expressive language, receptive language, and aphasia. These impairments are limiting patient from return to work, managing medications, managing appointments, household responsibilities, ADLs/IADLs, and effectively communicating at home and in community. Factors affecting potential to achieve goals and functional outcome are ability to learn/carryover information and severity of  impairments. Patient will benefit from skilled SLP services to address above impairments and improve overall function.  REHAB POTENTIAL: Good  PLAN: SLP FREQUENCY: 2x/week  SLP DURATION: 12 weeks  PLANNED INTERVENTIONS: Language facilitation, Environmental controls, Cueing hierachy, Internal/external aids, Functional tasks, and Multimodal communication approach    Leota Maka, CCC-SLP 09/06/2022, 12:40 PM

## 2022-09-06 NOTE — Therapy (Signed)
OUTPATIENT PHYSICAL THERAPY NEURO TREATMENT   Patient Name: Gordon Fisher MRN: [id number] DOB:February 07, 1961, 61 y.o., male Today's Date: 09/06/2022   PCP: None listed REFERRING PROVIDER: Jennye Boroughs, MD    PT End of Session - 09/06/22 1405     Visit Number 10    Number of Visits 16    Date for PT Re-Evaluation 09/20/22    Authorization Type [employer] [id number]    Authorization - Visit Number 10    Authorization - Number of Visits 20    Progress Note Due on Visit 10    PT Start Time 1400    PT Stop Time 1445    PT Time Calculation (min) 45 min    Equipment Utilized During Treatment Gait belt    Activity Tolerance Patient tolerated treatment well    Behavior During Therapy WFL for tasks assessed/performed                Past Medical History:  Diagnosis Date   Stroke [facility])    Past Surgical History:  Procedure Laterality Date   BUBBLE STUDY  02/15/2022   Procedure: BUBBLE STUDY;  Surgeon: Werner Lean, MD;  Location: [place];  Service: Cardiovascular;;   IR CT HEAD LTD  02/11/2022   IR CT HEAD LTD  02/11/2022   IR CT HEAD LTD  02/11/2022   IR PERCUTANEOUS ART THROMBECTOMY/INFUSION INTRACRANIAL INC DIAG ANGIO  02/11/2022   IR PERCUTANEOUS ART THROMBECTOMY/INFUSION INTRACRANIAL INC DIAG ANGIO  02/11/2022   IR US GUIDE VASC ACCESS RIGHT  02/11/2022   IR US GUIDE VASC ACCESS RIGHT  02/11/2022   RADIOLOGY WITH ANESTHESIA N/A 02/11/2022   Procedure: IR WITH ANESTHESIA;  Surgeon: Radiologist, Medication, MD;  Location: [place];  Service: Radiology;  Laterality: N/A;   RADIOLOGY WITH ANESTHESIA N/A 02/11/2022   Procedure: RADIOLOGY WITH ANESTHESIA;  Surgeon: Radiologist, Medication, MD;  Location: [place];  Service: Radiology;  Laterality: N/A;   TEE WITHOUT CARDIOVERSION N/A 02/15/2022   Procedure: TRANSESOPHAGEAL ECHOCARDIOGRAM (TEE);  Surgeon: Werner Lean, MD;  Location: [facility] ENDOSCOPY;  Service: Cardiovascular;  Laterality: N/A;   Patient Active Problem List    Diagnosis Date Noted   Hypocalcemia 03/22/2022   DVT, lower extremity, distal ([place]) 03/22/2022   Obesity 03/22/2022   Elevated LDL cholesterol level 03/22/2022   Acute ischemic left middle cerebral artery (MCA) stroke ([place]) 02/18/2022   Cerebrovascular accident (CVA) due to occlusion of left middle cerebral artery ([place]) 02/11/2022    ONSET DATE: April 2023  REFERRING DIAG: I63.9 (ICD-10-CM) - Cerebrovascular accident (CVA), unspecified mechanism ([place]) R13.10 (ICD-10-CM) - Dysphagia, unspecified type   THERAPY DIAG:  Hemiplegia and hemiparesis following cerebral infarction affecting right dominant side (HCC)  Muscle weakness (generalized)  Other abnormalities of gait and mobility  Unsteadiness on feet  Difficulty in walking, not elsewhere classified  Rationale for Evaluation and Treatment Rehabilitation  SUBJECTIVE:  SUBJECTIVE STATEMENT: Nothing new since last visit.  Had orthotic assessment and will get an off-the-shelf item for AFO-expecting around 11/20 Pt accompanied by: significant other  PERTINENT HISTORY: 02/11/2022 patient developed right-sided weakness, aphasia, went to hospital for evaluation.  Code stroke was activated.  He received TNK and then transferred to [facility].  He underwent cerebral angiogram with thrombectomy.  Unfortunately vessel reoccluded later the day and the second thrombectomy was attempted.  Left M2 dissection was identified and treated with stent but unfortunately stent demonstrated reocclusion   PAIN:  Are you having pain? No  PRECAUTIONS: Shoulder and Fall, Aphasia   WEIGHT BEARING RESTRICTIONS No  FALLS: Has patient fallen in last 6 months? Yes. Number of falls 1, required 3-assist to recover  LIVING ENVIRONMENT: Lives with: lives with their  spouse Lives in: House/apartment Stairs: Yes: Internal: flight steps; bilateral but cannot reach both and External: 4-5 steps; ramp is also present.  Has following equipment at home: Hemi walker, Wheelchair (manual), and sit-stand lift  PLOF: Independent  PATIENT GOALS regain independence  OBJECTIVE:   TODAY'S TREATMENT: 09/06/22 Activity Comments  Weight shift/stepping strategies Loading on RLE 3x10 various planes  Gait with AFO and NBQC -level surfaces CGA -stairs SBA-CGA -reciprocal stair practice CGA-min A  Gravity-minimized RLE hip flexion 3x10, therapist assist  Sidelying clam shells  2x10           TODAY'S TREATMENT: 09/01/22 Activity Comments  Stair training 3x12 RLE on bottom step, therapist facilitating reciprocal movement by contact right knee extension  LLE on 12" step BUE support on HR -therapist facilitating right knee extension/guarding: EO/EC x 15 sec, head turns 5x EO/EC  Standing heel raise 2x10 Therapist facilitating RLE for active plantarflexion  Right AFO PLS applied -RLE stair taps 3x10 reps to 12" step with therapist facilitating/assist hip flexion  Gait training w/ R PLS AFO and NBQC -sidestepping 4x20 ft  -retrowalking 4x20 ft -gait for speed 2 x 85 ft  Gait speed: 10 meter walk 1) 23.75 sec= 1.38 ft/sec 2) 24.37 sec = 1.34 ft/sec     TODAY'S TREATMENT: 08/30/2022 Activity Comments  Gait training with trial PLS AFO on R foot: Using hemiwalker 85 ft x 2 reps Therapist in front of patient and pt's L arm on PT's shoulder, 85 ft x 2 reps, with tactile cues for RLE stance time, VCs for LLE longer stride Use of SBQC, 85 ft x 3 reps, with improved step through pattern of gait  facilitation and cues for increased weightshift, weightbearing and stance time on RLE, for improved step length LLE  NMR for RLE with RLE as stance, LLE step forward/back, 2 x 10 reps Wearing R AFO                       HOME EXERCISE PROGRAM Last updated: 08/18/22 Access  Code: [email or web address] URL: [email or web address] Date: 08/18/2022 Prepared by: [facility]  Exercises - Forward Backward Weight Shift with Counter Support  - 1 x daily - 5 x weekly - 2 sets - 10 reps - Side to Side Weight Shift with Counter Support  - 1 x daily - 5 x weekly - 2 sets - 10 reps - Mini Squat with Counter Support  - 1 x daily - 5 x weekly - 2 sets - 10 reps - Stride Stance Weight Shift  - 1 x daily - 5 x weekly - 2 sets - 10 reps  Below measures were taken at time of initial evaluation unless otherwise specified:     DIAGNOSTIC FINDINGS: Follow-up MRI brain done revealing small areas of acute infarct in left-MCA territory with mild hemorrhagic transformation in left parietal lobe and left-MCA bifurcation residual vascular thrombus   COGNITION: Overall cognitive status:  Difficult to assess due to aphasia   SENSATION: Light touch: Impaired  Proprioception: Impaired   COORDINATION: RLE impaired, some deficits on left foot with alternating movements and isolated movements  EDEMA:  None   MUSCLE TONE: RLE: Modifed Ashworth Scale 2 = More marked increase in muscle tone through most of the ROM, but affected part(s) easily moved right quad most affected, no clonus right plantarflexors   MUSCLE LENGTH: Able to achieve full right knee extension to PROM  DTRs:  Patella 3+ = Brisk  POSTURE: No Significant postural limitations  LOWER EXTREMITY ROM:     LLE WNL, RLE AROM limited by weakness  LOWER EXTREMITY MMT:    MMT Right Eval Left Eval  Hip flexion 3- 5  Hip extension    Hip abduction 3- 4  Hip adduction 3- 5  Hip internal rotation    Hip external rotation    Knee flexion 3 5  Knee extension 2+ 5  Ankle dorsiflexion 0 5  Ankle plantarflexion 1 3+  Ankle inversion    Ankle eversion    (Blank rows = not tested)  BED MOBILITY:  NT  TRANSFERS: Assistive device utilized: Hemi walker  Sit to stand:  Modified independence Stand to sit: SBA Chair to chair: SBA and CGA Floor:  Total assist  RAMP:  Level of Assistance: CGA Assistive device utilized: Hemi walker Ramp Comments:   CURB:  Level of Assistance: Min A Assistive device utilized: Nutritional therapist Comments: cues in sequence  STAIRS:  Level of Assistance: CGA and Min A  Stair Negotiation Technique: Step to Pattern with Single Rail on Left  Number of Stairs: 10   Height of Stairs: 4-6"  Comments: cues and guarding  GAIT: Gait pattern: step to pattern, decreased ankle dorsiflexion- Right, and circumduction- Right Distance walked: 82 Assistive device utilized: Hemi walker Level of assistance: CGA Comments: deficits during turning  FUNCTIONAL TESTs:  5 times sit to stand: 11.59 sec with 3 retro LOB and poor eccentric control Timed up and go (TUG): 34.75 sec Berg Balance Scale: 32/56 Dynamic Gait Index: NT 2 minute walk test: 82 ft w/ hemi-walker, 0.68 ft/sec  M-CTSIB  Condition 1: Firm Surface, EO 30 Sec, Normal and Mild Sway  Condition 2: Firm Surface, EC 30 Sec, Mild and Moderate Sway  Condition 3: Foam Surface, EO 30 Sec, Mild Sway  Condition 4: Foam Surface, EC 26 Sec, Moderate and Severe Sway     PATIENT SURVEYS:  FOTO 47%  TODAY'S TREATMENT:  assessment   PATIENT EDUCATION: Education details: assessment findings, PT scope of practice Person educated: Patient and Spouse Education method: Explanation Education comprehension: verbalized understanding   HOME EXERCISE PROGRAM: TBD    GOALS: Goals reviewed with patient? Yes  SHORT TERM GOALS: Target date: 08/23/2022  Patient will perform HEP with family/caregiver supervision for improved strength, balance, transfers, and gait  Baseline: Goal status: GOAL MET, 08/23/2022  2.  Ambulate 12 stairs with left HR and supervision to improve safety within home Baseline: CGA-min A; per report CGA/supervision 6 steps Goal status: PARTIALLY MET,  08/23/2022  3.  Demonstrate improved safety and efficiency with ambulation per distance of 100 ft during 2MWT Baseline: 82 ft  w/ HW and CGA; 111 ft 08/23/2022 Goal status: GOAL MET, 08/23/2022    LONG TERM GOALS: Target date: 09/20/2022  Demonstrate improved balance and reduced risk for falls per score 40/56 Berg Balance Test Baseline: 32/56 Goal status:IN PROGRESS  2.  Demonstrate modified independent ambulation x 150 ft level surfaces using least restrictive AD in order to improve safety with home environment Baseline: CGA w/ HW Goal status: IN PROGRESS  3.  Manifest improved safety with gait per time of 18 sec TUG test using least restrictive AD Baseline: 34.75 sec w/ HW Goal status: IN PROGRESS    ASSESSMENT:  CLINICAL IMPRESSION: Activities to promote RLE loading response and single limb support. Stair ambulation with Right AFO and HR with step-to pattern and CGA-SBA for 15 steps. CGA-min A for promoting LLE advancement in reciprocal pattern for ascending stairs. Improved gait pattern and stability with right AFO and initiating trials in use of NBQC for less restrictive AD. Demonstrates a tendency to walk into obstacles on right side possibly demonstrating some spatial awareness/perception deficits as pt needs to keep a head down posture when navigating environment. Demo 2/5 right hip flexion strength. Continued sessions to progress motor control, strength, balance, and coordination to reduce risk for falls  OBJECTIVE IMPAIRMENTS Abnormal gait, decreased activity tolerance, decreased balance, decreased coordination, decreased knowledge of use of DME, decreased mobility, difficulty walking, decreased strength, impaired sensation, impaired tone, impaired UE functional use, and improper body mechanics.   ACTIVITY LIMITATIONS carrying, lifting, bending, standing, squatting, stairs, transfers, bathing, toileting, dressing, reach over head, and locomotion level  PARTICIPATION  LIMITATIONS: meal prep, cleaning, laundry, interpersonal relationship, driving, shopping, community activity, occupation, yard work, and leisure activities  [place] Time since onset of injury/illness/exacerbation are also affecting patient's functional outcome.   REHAB POTENTIAL: Excellent  CLINICAL DECISION MAKING: Evolving/moderate complexity  EVALUATION COMPLEXITY: Moderate  PLAN: PT FREQUENCY: 2x/week  PT DURATION: 8 weeks  PLANNED INTERVENTIONS: Therapeutic exercises, Therapeutic activity, Neuromuscular re-education, Balance training, Gait training, Patient/Family education, Self Care, Joint mobilization, Stair training, Vestibular training, Canalith repositioning, Orthotic/Fit training, DME instructions, Aquatic Therapy, Dry Needling, Electrical stimulation, Spinal mobilization, Cryotherapy, Moist heat, Splintting, Taping, and Manual therapy  PLAN FOR NEXT SESSION: Continue gait training with AFO trial.  Continue with RLE step ups; use of mirror for R wt shift, RLE forced weightbearing, quadruped?   3:58 PM, 09/06/22 M. Sherlyn Lees, PT, DPT Physical Therapist- [place] Office Number: [phone]     [place] at [facility] [address] [place], [place] [address] Phone # [phone] Fax # [phone]

## 2022-09-06 NOTE — Therapy (Signed)
OUTPATIENT OCCUPATIONAL THERAPY NEURO TREATMENT NOTE  Patient Name: Gordon Fisher MRN: [id number] DOB:1961-10-29, 61 y.o., male Today's Date: 09/06/2022  PCP: none on file REFERRING PROVIDER: Jennye Boroughs, MD    OT End of Session - 09/06/22 1335     Visit Number 10    Number of Visits 17    Date for OT Re-Evaluation 09/24/22    Authorization Type [employer]    Authorization Time Period VL: 60 combined (OT/PT/ST)    Authorization - Visit Number 10    Authorization - Number of Visits 20    OT Start Time 1315    OT Stop Time 1400    OT Time Calculation (min) 45 min    Activity Tolerance Patient tolerated treatment well    Behavior During Therapy WFL for tasks assessed/performed               Past Medical History:  Diagnosis Date   Stroke [facility])    Past Surgical History:  Procedure Laterality Date   BUBBLE STUDY  02/15/2022   Procedure: BUBBLE STUDY;  Surgeon: Werner Lean, MD;  Location: [place];  Service: Cardiovascular;;   IR CT HEAD LTD  02/11/2022   IR CT HEAD LTD  02/11/2022   IR CT HEAD LTD  02/11/2022   IR PERCUTANEOUS ART THROMBECTOMY/INFUSION INTRACRANIAL INC DIAG ANGIO  02/11/2022   IR PERCUTANEOUS ART THROMBECTOMY/INFUSION INTRACRANIAL INC DIAG ANGIO  02/11/2022   IR US GUIDE VASC ACCESS RIGHT  02/11/2022   IR US GUIDE VASC ACCESS RIGHT  02/11/2022   RADIOLOGY WITH ANESTHESIA N/A 02/11/2022   Procedure: IR WITH ANESTHESIA;  Surgeon: Radiologist, Medication, MD;  Location: [place];  Service: Radiology;  Laterality: N/A;   RADIOLOGY WITH ANESTHESIA N/A 02/11/2022   Procedure: RADIOLOGY WITH ANESTHESIA;  Surgeon: Radiologist, Medication, MD;  Location: [place];  Service: Radiology;  Laterality: N/A;   TEE WITHOUT CARDIOVERSION N/A 02/15/2022   Procedure: TRANSESOPHAGEAL ECHOCARDIOGRAM (TEE);  Surgeon: Werner Lean, MD;  Location: [facility] ENDOSCOPY;  Service: Cardiovascular;  Laterality: N/A;   Patient Active Problem List   Diagnosis  Date Noted   Hypocalcemia 03/22/2022   DVT, lower extremity, distal ([place]) 03/22/2022   Obesity 03/22/2022   Elevated LDL cholesterol level 03/22/2022   Acute ischemic left middle cerebral artery (MCA) stroke ([place]) 02/18/2022   Cerebrovascular accident (CVA) due to occlusion of left middle cerebral artery ([place]) 02/11/2022    ONSET DATE: referral 07/15/22 (CVA 02/11/22)  REFERRING DIAG: I63.9 (ICD-10-CM) - Cerebrovascular accident (CVA), unspecified mechanism ([place]) R13.10 (ICD-10-CM) - Dysphagia, unspecified type   THERAPY DIAG:  Hemiplegia and hemiparesis following cerebral infarction affecting right dominant side (HCC)  Muscle weakness (generalized)  Other abnormalities of gait and mobility  Unsteadiness on feet  Other lack of coordination  Other disturbances of skin sensation  Rationale for Evaluation and Treatment Rehabilitation  SUBJECTIVE:   SUBJECTIVE STATEMENT: Pt with no c/o pain during any tasks. Pt accompanied by: self  PERTINENT HISTORY: Ischemic L MCA CVA w/ residual R-sided hemiparesis 02/11/22; in relatively good health prior to onset  PRECAUTIONS: Fall  PAIN: Are you having pain? No  FALLS: Has patient fallen in last 6 months? Yes. Number of falls 1  PLOF: Independent, Independent with basic ADLs, and Independent with gait  PATIENT GOALS: continue to make gains, independence with getting dressed   OBJECTIVE:   HAND DOMINANCE: Right  FUNCTIONAL OUTCOME MEASURES: FOTO: 11  UPPER EXTREMITY ROM    Active ROM: Pt with no AROM during evaluation.  Pt was able to elicit min shoulder elevation and slight gross grasp when cued and provided increased time to initiate movement.    Passive ROM Right Eval - 9/25  Shoulder flexion 100  Shoulder abduction   Shoulder adduction   Shoulder extension   Shoulder internal rotation WNL  Shoulder external rotation onset of pain >5-10* from neutral  Middle trapezius   Lower trapezius   Elbow flexion WNL   Elbow extension WNL  Wrist flexion WNL  Wrist extension WNL  Wrist ulnar deviation   Wrist radial deviation   Wrist pronation   Wrist supination   (Blank rows = not tested)  HAND FUNCTION: No functional grasp. Trace gross grasp with increased time for initiation  SENSATION: Difficult to assess due to expressive aphasia  MUSCLE TONE: RUE: Mild and Hypertonic  ------------------------------------------------------------------------------------------------------------------------------------------------------------ (objective measures above completed at initial evaluation unless otherwise dated)  TODAY'S TREATMENT:  09/06/22 NMR/WB through elbows: OT facilitating positioning and alignment of RUE with appropriate WB through elbow and in to shoulder during reaching activity.  Engaged in reaching for cards with LUE across midline to further facilitate WB through RUE, OT providing positioning to further facilitate WB.  Incorporated cognitive challenge to matching cards based on number, pt requiring increased time 20% of time but no verbal cues for attention to task or correct sorting.   Towel glides: LUE over RUE to further facilitate ROM with shoulder flexion/extension and horizontal abduction/adduction.  OT providing min cues for technique, fading to supervision.  OT placed targets in semi-circle to facilitate increased reach, directed to alternate colors for change in direction and cognitive challenge. AAROM: forward reaching with OT providing support under elbow to maintain proper positioning of R shoulder.  Pt able to initiate movement with min-mod compensatory strategies at trunk but able to recognize.  Supination/pronation on table top with hand over hand to facilitate movement.  OT providing initial facilitation, pt able to complete self-ROM with repetition and min cues.    09/01/22 UE Ranger: Engaged in shoulder flexion/extension and horizontal abduction/adduction with UE Ranger.  OT  providing tactile support and facilitation at elbow to maintain good shoulder positioning.  OT modified task to completing with hemi-walker on 2 legs.  OT initially facilitating by placing hand over hand to maintain sustained grasp, transitioning to providing support at R elbow to facilitate approximation of R shoulder joint.  Pt able to complete flexion/extension with hemi-walker. NMR/WB through RUE: standing with WB through R hand and then over half ball on table due to tone in fingers with extension/WB.  Engaged in reaching for cards with LUE across midline to further facilitate WB through RUE, OT providing positioning to further facilitate WB.  Incorporated cognitive challenge to matching cards based on number, pt requiring mod cues for attention to task and correct sorting.   Dynamic standing: OT facilitating weight shifting through hips with exaggerated mobility with bumping OT hip on R and wife hip on L to increase weight shifting and acceptance of WB through RLE while in standing.  Discussed functional carryover of importance of weight shifting.  Utilized Geologist, engineering for [employer] for weight shifting during blocked practice and when transitioning back to card sorting activity.    08/30/22 LB dressing: engaged in simulated LB dressing with gait belt progressing to use of trash bag with bottom cut off.  OT demonstrated LB dressing with use of reacher, figure 4 positioning, and RLE crossed at ankle over LLE.  Pt demonstrating most success with ankles crossed and ability  to reach forward to thread RLE and then LLE.  Pt able to stand and pull pants over hips with CGA to close supervision.  OT educated on recommendation for sitting as pt attempted to doff pants in standing.  Discussed seat height as well to facilitate increased reach. Scapular retraction/shoulder shrugs: utilized Geologist, engineering for Warden/ranger.  OT providing support and facilitation as well as tactile cues and/or tapping to further facilitate  muscle activation.   RUE as stabilizer: Reiterated utilizing RUE as stabilizer to gross assist with self-care tasks.  Pt's spouse reports that he is utilizing RUE when opening containers, holding toothpaste or toothbrush, engaging in wiping table tops and pushing open doors with LUE over RUE.     PATIENT EDUCATION: Ongoing condition-specific education related to therapeutic interventions completed this session Person educated: Patient and Spouse Education method: Explanation, Demonstration, Tactile cues, Verbal cues, and Handouts Education comprehension: verbalized understanding and needs further education   HOME EXERCISE PROGRAM: Written exercises of shoulder and elbow in supine with cues for hand placement and facilitation.  MedBridge Access Code: [email or web address] URL: [email or web address] - Supine Shoulder Flexion AAROM with Hands Clasped  - 1 x daily - 1 sets - 10 reps - Seated Elbow Extension and Shoulder External Rotation AAROM at Table with Towel  - 1 x daily - 1 sets - 10 reps - Finger Extension with Wrist Extension Caregiver PROM  - 1 x daily - 1 sets - 10 reps  GOALS: Goals reviewed with patient? Yes  SHORT TERM GOALS: Target date: 08/27/22  Pt and spouse will be independent with PROM and self-ROM HEP for shoulder stability and ROM. Baseline: Goal status: MET - 08/30/22  2.  Pt will verbalize understanding of task modifications and/or potential A/E needs to increase ease, safety, and independence w/ ADLs Baseline:  Goal status: MET -08/30/22  3.  Pt will verbalize understanding of strategies and use of RUE as stabilizer during ADLs. Baseline:  Goal status: MET - 08/30/22   LONG TERM GOALS: Target date: 09/24/22  Pt will demonstrate ability to complete UB dressing with supervision with use of hemi-dressing technique. Baseline:  Goal status: IN PROGRESS  2.  Pt will demonstrate improved awareness of and positioning of RUE during mobility and self-care  tasks. Baseline:  Goal status: IN PROGRESS  3.  Pt will utilize RUE as gross assist during grooming tasks (such as washing hands and applying toothpaste). Baseline:  Goal status: IN PROGRESS  4.  Pt will verbalize and or demonstrate increased functional use of BUE during LB dressing to complete at supervision level. Baseline:  Goal status: IN PROGRESS  5.  Pt will verbalize improved functional incorporation of RUE with ADLs by reporting improved FOTO score to >/= to 20. Baseline: 11 Goal status: IN PROGRESS   ASSESSMENT:  CLINICAL IMPRESSION: Session today with focus on continued NMR of RUE, incorporating increased focus on WB followed by increased activation of movements.  Pt initially hesitant of WB through R elbow, however with facilitation and additional education pt responsive to activity.  Continued difficulty with shoulder AROM; R shoulder subluxation likely contributing to limitations.  PERFORMANCE DEFICITS in functional skills including ADLs, IADLs, coordination, sensation, tone, ROM, strength, pain, flexibility, FMC, GMC, mobility, balance, body mechanics, vision, and UE functional use and psychosocial skills including environmental adaptation and routines and behaviors.   IMPAIRMENTS are limiting patient from ADLs and IADLs.   COMORBIDITIES may have co-morbidities  that affects occupational performance. Patient will benefit from skilled OT to address above  impairments and improve overall function.   PLAN: OT FREQUENCY: 2x/week  OT DURATION: 8 weeks  PLANNED INTERVENTIONS: self care/ADL training, therapeutic exercise, therapeutic activity, neuromuscular re-education, manual therapy, passive range of motion, balance training, functional mobility training, splinting, electrical stimulation, compression bandaging, moist heat, cryotherapy, patient/family education, visual/perceptual remediation/compensation, psychosocial skills training, energy conservation, coping strategies  training, and DME and/or AE instructions  RECOMMENDED OTHER SERVICES: N/A  CONSULTED AND AGREED WITH PLAN OF CARE: Patient and family member/caregiver  PLAN FOR NEXT SESSION: Continue w/ scapular stability: shoulder shrugs (print pt instructions from session on 10/25), rolls, and scapular retraction; WB and dynamic standing, supine and sidelying NMR   Suhailah Kwan, OTR/L 09/06/2022, 3:57 PM

## 2022-09-07 NOTE — Therapy (Signed)
OUTPATIENT PHYSICAL THERAPY NEURO TREATMENT   Patient Name: Gordon Fisher MRN: [id number] DOB:11-04-1960, 61 y.o., male Today's Date: 09/08/2022   PCP: None listed REFERRING PROVIDER: Jennye Boroughs, MD    PT End of Session - 09/08/22 1401     Visit Number 11    Number of Visits 16    Date for PT Re-Evaluation 09/20/22    Authorization Type [employer] 2023    Authorization - Visit Number 11    Authorization - Number of Visits 20    Progress Note Due on Visit 10    PT Start Time 1325    PT Stop Time 1400    PT Time Calculation (min) 35 min    Equipment Utilized During Treatment Gait belt    Activity Tolerance Patient tolerated treatment well    Behavior During Therapy WFL for tasks assessed/performed                 Past Medical History:  Diagnosis Date   Stroke [facility])    Past Surgical History:  Procedure Laterality Date   BUBBLE STUDY  02/15/2022   Procedure: BUBBLE STUDY;  Surgeon: Werner Lean, MD;  Location: [place];  Service: Cardiovascular;;   IR CT HEAD LTD  02/11/2022   IR CT HEAD LTD  02/11/2022   IR CT HEAD LTD  02/11/2022   IR PERCUTANEOUS ART THROMBECTOMY/INFUSION INTRACRANIAL INC DIAG ANGIO  02/11/2022   IR PERCUTANEOUS ART THROMBECTOMY/INFUSION INTRACRANIAL INC DIAG ANGIO  02/11/2022   IR US GUIDE VASC ACCESS RIGHT  02/11/2022   IR US GUIDE VASC ACCESS RIGHT  02/11/2022   RADIOLOGY WITH ANESTHESIA N/A 02/11/2022   Procedure: IR WITH ANESTHESIA;  Surgeon: Radiologist, Medication, MD;  Location: [place];  Service: Radiology;  Laterality: N/A;   RADIOLOGY WITH ANESTHESIA N/A 02/11/2022   Procedure: RADIOLOGY WITH ANESTHESIA;  Surgeon: Radiologist, Medication, MD;  Location: [place];  Service: Radiology;  Laterality: N/A;   TEE WITHOUT CARDIOVERSION N/A 02/15/2022   Procedure: TRANSESOPHAGEAL ECHOCARDIOGRAM (TEE);  Surgeon: Werner Lean, MD;  Location: [facility] ENDOSCOPY;  Service: Cardiovascular;  Laterality: N/A;   Patient Active Problem List    Diagnosis Date Noted   Hypocalcemia 03/22/2022   DVT, lower extremity, distal ([place]) 03/22/2022   Obesity 03/22/2022   Elevated LDL cholesterol level 03/22/2022   Acute ischemic left middle cerebral artery (MCA) stroke ([place]) 02/18/2022   Cerebrovascular accident (CVA) due to occlusion of left middle cerebral artery ([place]) 02/11/2022    ONSET DATE: April 2023  REFERRING DIAG: I63.9 (ICD-10-CM) - Cerebrovascular accident (CVA), unspecified mechanism ([place]) R13.10 (ICD-10-CM) - Dysphagia, unspecified type   THERAPY DIAG:  Muscle weakness (generalized)  Other abnormalities of gait and mobility  Unsteadiness on feet  Difficulty in walking, not elsewhere classified  Rationale for Evaluation and Treatment Rehabilitation  SUBJECTIVE:  SUBJECTIVE STATEMENT: Wife reports some MD appointments earlier this week that went well.  Pt accompanied by: significant other  PERTINENT HISTORY: 02/11/2022 patient developed right-sided weakness, aphasia, went to hospital for evaluation.  Code stroke was activated.  He received TNK and then transferred to [facility].  He underwent cerebral angiogram with thrombectomy.  Unfortunately vessel reoccluded later the day and the second thrombectomy was attempted.  Left M2 dissection was identified and treated with stent but unfortunately stent demonstrated reocclusion   PAIN:  Are you having pain? No  PRECAUTIONS: Shoulder and Fall, Aphasia   WEIGHT BEARING RESTRICTIONS No  FALLS: Has patient fallen in last 6 months? Yes. Number of falls 1, required 3-assist to recover  LIVING ENVIRONMENT: Lives with: lives with their spouse Lives in: House/apartment Stairs: Yes: Internal: flight steps; bilateral but cannot reach both and External: 4-5 steps; ramp is also  present.  Has following equipment at home: Hemi walker, Wheelchair (manual), and sit-stand lift  PLOF: Independent  PATIENT GOALS regain independence  OBJECTIVE:      TODAY'S TREATMENT: 09/08/22 Activity Comments  standing in front of mirror:  wide stance lateral wt shift staggered stance ant/pos wt shift R wt shift + forward step L wt shift + forward step   PT assisting to approximate R hand on back of chair; heavy initial cueing to assist in hip rather than shoulder lean; using quad cane in L hand for added support   in II bars:   Sidestepping R/L hip ABD 2x5 each LE R lateral wt shift marching mini squat Towel under R hand for improved sliding; blocking R knee; good understanding of R wt shift when using external target  gait with posterior leaf spring AFO and quad cane [id number] Cueing to shift R before stepping left- good effort but poor timing                HOME EXERCISE PROGRAM Last updated: 08/18/22 Access Code: [email or web address]: [email or web address] Date: 08/18/2022 Prepared by: [facility]  Exercises - Forward Backward Weight Shift with Counter Support  - 1 x daily - 5 x weekly - 2 sets - 10 reps - Side to Side Weight Shift with Counter Support  - 1 x daily - 5 x weekly - 2 sets - 10 reps - Mini Squat with Counter Support  - 1 x daily - 5 x weekly - 2 sets - 10 reps - Stride Stance Weight Shift  - 1 x daily - 5 x weekly - 2 sets - 10 reps    Below measures were taken at time of initial evaluation unless otherwise specified:     DIAGNOSTIC FINDINGS: Follow-up MRI brain done revealing small areas of acute infarct in left-MCA territory with mild hemorrhagic transformation in left parietal lobe and left-MCA bifurcation residual vascular thrombus   COGNITION: Overall cognitive status:  Difficult to assess due to aphasia   SENSATION: Light touch: Impaired  Proprioception: Impaired   COORDINATION: RLE  impaired, some deficits on left foot with alternating movements and isolated movements  EDEMA:  None   MUSCLE TONE: RLE: Modifed Ashworth Scale 2 = More marked increase in muscle tone through most of the ROM, but affected part(s) easily moved right quad most affected, no clonus right plantarflexors   MUSCLE LENGTH: Able to achieve full right knee extension to PROM  DTRs:  Patella 3+ = Brisk  POSTURE: No Significant postural limitations  LOWER EXTREMITY ROM:  LLE WNL, RLE AROM limited by weakness  LOWER EXTREMITY MMT:    MMT Right Eval Left Eval  Hip flexion 3- 5  Hip extension    Hip abduction 3- 4  Hip adduction 3- 5  Hip internal rotation    Hip external rotation    Knee flexion 3 5  Knee extension 2+ 5  Ankle dorsiflexion 0 5  Ankle plantarflexion 1 3+  Ankle inversion    Ankle eversion    (Blank rows = not tested)  BED MOBILITY:  NT  TRANSFERS: Assistive device utilized: Hemi walker  Sit to stand: Modified independence Stand to sit: SBA Chair to chair: SBA and CGA Floor:  Total assist  RAMP:  Level of Assistance: CGA Assistive device utilized: Hemi walker Ramp Comments:   CURB:  Level of Assistance: Min A Assistive device utilized: Nutritional therapist Comments: cues in sequence  STAIRS:  Level of Assistance: CGA and Min A  Stair Negotiation Technique: Step to Pattern with Single Rail on Left  Number of Stairs: 10   Height of Stairs: 4-6"  Comments: cues and guarding  GAIT: Gait pattern: step to pattern, decreased ankle dorsiflexion- Right, and circumduction- Right Distance walked: 82 Assistive device utilized: Hemi walker Level of assistance: CGA Comments: deficits during turning  FUNCTIONAL TESTs:  5 times sit to stand: 11.59 sec with 3 retro LOB and poor eccentric control Timed up and go (TUG): 34.75 sec Berg Balance Scale: 32/56 Dynamic Gait Index: NT 2 minute walk test: 82 ft w/ hemi-walker, 0.68 ft/sec  M-CTSIB  Condition 1:  Firm Surface, EO 30 Sec, Normal and Mild Sway  Condition 2: Firm Surface, EC 30 Sec, Mild and Moderate Sway  Condition 3: Foam Surface, EO 30 Sec, Mild Sway  Condition 4: Foam Surface, EC 26 Sec, Moderate and Severe Sway     PATIENT SURVEYS:  FOTO 47%  TODAY'S TREATMENT:  assessment   PATIENT EDUCATION: Education details: assessment findings, PT scope of practice Person educated: Patient and Spouse Education method: Explanation Education comprehension: verbalized understanding   HOME EXERCISE PROGRAM: TBD    GOALS: Goals reviewed with patient? Yes  SHORT TERM GOALS: Target date: 08/23/2022  Patient will perform HEP with family/caregiver supervision for improved strength, balance, transfers, and gait  Baseline: Goal status: GOAL MET, 08/23/2022  2.  Ambulate 12 stairs with left HR and supervision to improve safety within home Baseline: CGA-min A; per report CGA/supervision 6 steps Goal status: PARTIALLY MET, 08/23/2022  3.  Demonstrate improved safety and efficiency with ambulation per distance of 100 ft during 2MWT Baseline: 82 ft w/ HW and CGA; 111 ft 08/23/2022 Goal status: GOAL MET, 08/23/2022    LONG TERM GOALS: Target date: 09/20/2022  Demonstrate improved balance and reduced risk for falls per score 40/56 Berg Balance Test Baseline: 32/56 Goal status:IN PROGRESS  2.  Demonstrate modified independent ambulation x 150 ft level surfaces using least restrictive AD in order to improve safety with home environment Baseline: CGA w/ HW Goal status: IN PROGRESS  3.  Manifest improved safety with gait per time of 18 sec TUG test using least restrictive AD Baseline: 34.75 sec w/ HW Goal status: IN PROGRESS    ASSESSMENT:  CLINICAL IMPRESSION: Patient arrived to session with out complaints. Worked on weight shifting and balance activities with mirror feedback to improve confidence with and awareness of R side WBing. Duration of session was spent with R AFO  donned for max ankle stability and foot clearance. Patient with good tolerance  of some R gripping/WBing with standing activities today and demonstrated best R sided wt shift with use of external cue/target. Possibly plan to try forearm walker in future. Patient tolerated session well and without complaints at end of session.   OBJECTIVE IMPAIRMENTS Abnormal gait, decreased activity tolerance, decreased balance, decreased coordination, decreased knowledge of use of DME, decreased mobility, difficulty walking, decreased strength, impaired sensation, impaired tone, impaired UE functional use, and improper body mechanics.   ACTIVITY LIMITATIONS carrying, lifting, bending, standing, squatting, stairs, transfers, bathing, toileting, dressing, reach over head, and locomotion level  PARTICIPATION LIMITATIONS: meal prep, cleaning, laundry, interpersonal relationship, driving, shopping, community activity, occupation, yard work, and leisure activities  [place] Time since onset of injury/illness/exacerbation are also affecting patient's functional outcome.   REHAB POTENTIAL: Excellent  CLINICAL DECISION MAKING: Evolving/moderate complexity  EVALUATION COMPLEXITY: Moderate  PLAN: PT FREQUENCY: 2x/week  PT DURATION: 8 weeks  PLANNED INTERVENTIONS: Therapeutic exercises, Therapeutic activity, Neuromuscular re-education, Balance training, Gait training, Patient/Family education, Self Care, Joint mobilization, Stair training, Vestibular training, Canalith repositioning, Orthotic/Fit training, DME instructions, Aquatic Therapy, Dry Needling, Electrical stimulation, Spinal mobilization, Cryotherapy, Moist heat, Splintting, Taping, and Manual therapy  PLAN FOR NEXT SESSION: forearm walker? Continue gait training with AFO trial.  Continue with RLE step ups; use of mirror for R wt shift, RLE forced weightbearing, quadruped?   Janene Harvey, PT, DPT 09/08/22 2:03 PM  [facility] [address] [place], [place], [place] [address] Phone # [phone] Fax # [phone]

## 2022-09-08 ENCOUNTER — Ambulatory Visit: Payer: 59 | Admitting: Physical Therapy

## 2022-09-08 ENCOUNTER — Telehealth: Payer: Self-pay

## 2022-09-08 ENCOUNTER — Ambulatory Visit: Payer: 59

## 2022-09-08 ENCOUNTER — Encounter: Payer: Self-pay | Admitting: Physical Therapy

## 2022-09-08 ENCOUNTER — Ambulatory Visit: Payer: 59 | Admitting: Occupational Therapy

## 2022-09-08 DIAGNOSIS — R2681 Unsteadiness on feet: Secondary | ICD-10-CM

## 2022-09-08 DIAGNOSIS — R2689 Other abnormalities of gait and mobility: Secondary | ICD-10-CM

## 2022-09-08 DIAGNOSIS — I69351 Hemiplegia and hemiparesis following cerebral infarction affecting right dominant side: Secondary | ICD-10-CM | POA: Diagnosis not present

## 2022-09-08 DIAGNOSIS — R278 Other lack of coordination: Secondary | ICD-10-CM

## 2022-09-08 DIAGNOSIS — R262 Difficulty in walking, not elsewhere classified: Secondary | ICD-10-CM

## 2022-09-08 DIAGNOSIS — R4701 Aphasia: Secondary | ICD-10-CM

## 2022-09-08 DIAGNOSIS — R208 Other disturbances of skin sensation: Secondary | ICD-10-CM

## 2022-09-08 DIAGNOSIS — R482 Apraxia: Secondary | ICD-10-CM

## 2022-09-08 DIAGNOSIS — M6281 Muscle weakness (generalized): Secondary | ICD-10-CM

## 2022-09-08 NOTE — Therapy (Signed)
OUTPATIENT SPEECH LANGUAGE PATHOLOGY TREATMENT   Patient Name: Gordon Fisher MRN: [id number] DOB:Jun 12, 1961, 61 y.o., male Today's Date: 09/08/2022  PCP: TBD REFERRING PROVIDER: Jennye Boroughs, MD    End of Session - 09/08/22 1709     Visit Number 9    Number of Visits 25    Date for SLP Re-Evaluation 10/24/22    Authorization - Visit Number 9    Authorization - Number of Visits 20    SLP Start Time 7793    SLP Stop Time  1319    SLP Time Calculation (min) 45 min    Activity Tolerance Patient tolerated treatment well                 Past Medical History:  Diagnosis Date   Stroke [facility])    Past Surgical History:  Procedure Laterality Date   BUBBLE STUDY  02/15/2022   Procedure: BUBBLE STUDY;  Surgeon: Gordon Lean, MD;  Location: [place];  Service: Cardiovascular;;   IR CT HEAD LTD  02/11/2022   IR CT HEAD LTD  02/11/2022   IR CT HEAD LTD  02/11/2022   IR PERCUTANEOUS ART THROMBECTOMY/INFUSION INTRACRANIAL INC DIAG ANGIO  02/11/2022   IR PERCUTANEOUS ART THROMBECTOMY/INFUSION INTRACRANIAL INC DIAG ANGIO  02/11/2022   IR US GUIDE VASC ACCESS RIGHT  02/11/2022   IR US GUIDE VASC ACCESS RIGHT  02/11/2022   RADIOLOGY WITH ANESTHESIA N/A 02/11/2022   Procedure: IR WITH ANESTHESIA;  Surgeon: Radiologist, Medication, MD;  Location: [place];  Service: Radiology;  Laterality: N/A;   RADIOLOGY WITH ANESTHESIA N/A 02/11/2022   Procedure: RADIOLOGY WITH ANESTHESIA;  Surgeon: Radiologist, Medication, MD;  Location: [place];  Service: Radiology;  Laterality: N/A;   TEE WITHOUT CARDIOVERSION N/A 02/15/2022   Procedure: TRANSESOPHAGEAL ECHOCARDIOGRAM (TEE);  Surgeon: Gordon Lean, MD;  Location: [facility] ENDOSCOPY;  Service: Cardiovascular;  Laterality: N/A;   Patient Active Problem List   Diagnosis Date Noted   Hypocalcemia 03/22/2022   DVT, lower extremity, distal ([place]) 03/22/2022   Obesity 03/22/2022   Elevated LDL cholesterol level 03/22/2022   Acute ischemic  left middle cerebral artery (MCA) stroke ([place]) 02/18/2022   Cerebrovascular accident (CVA) due to occlusion of left middle cerebral artery ([place]) 02/11/2022    ONSET DATE: 02-11-22   REFERRING DIAG: I63.9 (ICD-10-CM) - Cerebrovascular accident (CVA), unspecified mechanism ([place]) R13.10 (ICD-10-CM) - Dysphagia, unspecified type   THERAPY DIAG:  Verbal apraxia  Aphasia  Rationale for Evaluation and Treatment Rehabilitation  SUBJECTIVE:   SUBJECTIVE STATEMENT: "I mentioned last time it's been better with spontaneous words." Pt accompanied by: self  PERTINENT HISTORY: [name]. Bera is a 61 year old male in relatively good health who was admitted via [facility] on 02/11/22 with right sided weakness, right facial droop and slurred speech. CTH showed L-MCA hyperdense sign and he received TNK prior to transfer. CTA showed L-MCA MI occlusion and he underwent cerebral angio with thrombectomy and TICI 3 flow by Dr. Nelida Gores [name].  Follow up MRI brain done revealing small areas of acute infarct infarct in L-MCA territory with mild hemorrhagic transformation in left parietal lobe and L-MCA bifurcation residual vascular thrombus. He was taken back for thrombectomy with stent for dissection but had intra-stent occlusion which was unable to be recanalized. Post op CT showed small SAH which was stable on follow up. He was started on ASA/Brillinta. MBS 04/18 showed oral dysphagia with oral delay and no penetration or aspiration. He was started on D3, thins with supervision. Repeat  MRI brain on 04/16 showed extensive evolving acute ischemic L-MCA occlusion increased in size with associated edema, few additional small acute cortical subcortical infarcts involving contralateral right frontal lobe and left ACA distribution. Stroke felt to be due to unknown etiology and wife elected on Zio patch on discharge to rule out A fib. Patient with resultant global aphasia with limited verbal output, right inattention with  right hemiplegia and knee instability on standing attempts. Gordon Fisher was d/c'd from [facility] on 03-19-22, and has had HHST since that time until last week.   PAIN:  Are you having pain? No  PATIENT GOALS Pt indicated he would like talking to improve  OBJECTIVE:  DIAGNOSTIC FINDINGS: MRI without contrast 02/13/22 IMPRESSION: 1. Extensive evolving acute ischemic left MCA distribution infarct, increased in size as compared to previous MRI from 02/11/2022. Associated edema without significant midline shift. 2. Small volume acute subarachnoid hemorrhage at the posterior aspect of the left Sylvian fissure, stable. 3. Few additional small volume acute ischemic cortical and subcortical infarcts involving the contralateral right frontal lobe and left ACA distribution as above. 4. Underlying chronic microvascular ischemic disease.    PATIENT REPORTED OUTCOME MEASURES (PROM): Communication Effectiveness Survey: Pt scored 8/32 - higher scores indicate more effective communication/QOL.   TODAY'S TREATMENT:  09/08/22: Gordon Fisher stated they had been busy this week with appointments but still work at practicing some. SLP initiated a 20-minute discussion today with Gordon Fisher, and SLP explaining speech generating device in depth, showing Gordon Fisher and Gordon Fisher options for trial device and deciding on the Lingraphica Touch Talk. SLP to make referral for this in the next few days.  Family names were written with mod-max A usually - pt got first 3 letters correct with childrens and grandchildrens' names. SLP showed Gordon Fisher how to break up daughter's name to [id number]-[id number] to encourage brain's processing in a different way in order to foster new neural pathways to say the name.  09/06/22: SLP targeted verbal expression with picture naming and divergent naming. Pt notably demonstrates he knows the word/s he wants to say but is finding it difficult to pronounce them (verbal apraxia). Pt's expression improves with visual cues (oral cues  from SLP for initial phoneme/sound) - 75% successful even without sound necessary - just visual cue for articulator placement.   09/01/22: SLP worked with pt's verbal expression and auditory comprehension today. Simple conversation/sentences were understood 90% of the time, 100% with one repeat by speaker. With opposites/paired words, pt had 70% success, improved to 90% with min A. With cloze phrases/sentences pt's response accuracy improved with 1 syllable words, but overall success was 17% with min A, improved to 50% with mod-max A. Often, pt knew the response but knew he was going to misarticulate the response. Written cues were helpful for pt here. SLP encouraged wife to cont with cloze phrases and paired words at home.  10/25/23Laretta Fisher brought pt's cards today and SLP reviewed them with pt, modleing to wife cueing methods and hierarchy.Pt functionally said 6/7 salient objects (without written words) with mod-max cues, and 1/7 spontaneously. He said family names with written cues 6/7 (functionally), and 1/7 wth max cues Janett Billow"). Pt demo'd understnading of directions in context 75% today. SLP introduce possibility of Lingraphica to pt and wife and educated about studies proving that AAC assists verbal communication and that pt may not have to have assistance with a AAC device but we will know more as time progresses.   08/23/22: SLP targeted verbal expression with pt today. Heavy perseveration with "  [name]" grandson's name for all J names. SLP even did months of the year and pt cont to have perseveration with [name] for all J names. "[name]" - pt cont to make this into two syllables 90% of the time, with consistent max A. Pt used multi-modal communization using chicken clucks with SLP assistance and encouragement when pt could not say "chickens", and pointed to ages of his grandchildren with SLP writing numbers 1-10. Mod-max A needed for grandson 2 months old. Pt showed 1 and then gestured taking a short  person's height for SLP to know young child. MAX A needed for month names > 1 syllable. With one syllable months pt 80% success. Pt told SLP name of his truck, and functionally told SLP name of one of his two tractors. Req'd mod/max A otherwise. Pt to cont to practice salient/high frequency/high importance words.   08/18/22: SLP targeted spelling today with pt - 3 letter words 100%, 4-letter words 80% - [name] stated pt had s/sx dyslexia prior to CVA. SLP to monitor this. SLP provided some other hints/helpful suggestions for home practice and communication at home today. SLP noted pt read 3 and 4 letter words 50% accuracy when spelled correctly.   08/16/22: SLP assessed pt's reading comprehension - pt reading single words 6/6 and simple sentences of 6-9 words ("the boy is riding his bike in the park") 6/6. [name] said pt having difficulty reading newspaper from home. SLP did reading task (newspaper) with pt today. SLP highlighted key words for pt and he answered 4/6 questions correct by pt answering yes/no and pointing to correct portion in the article to indicate place where the answer was found. Pt will return CES next session or two.    PATIENT EDUCATION: Education details: see "Today's therapy" Person educated: Patient and Spouse Education method: Explanation and Demonstration Education comprehension: verbalized understanding and needs further education   GOALS: Goals reviewed with patient? Yes, in general  SHORT TERM GOALS: Target date: 08/31/2022 extended one week due to visits  Pt will name 6 salient family names when given picture and written name with consistent mod-max cues in 3 sessions (precursor for speech generating device) Baseline:08-25-22. 09-01-22 Goal status: partially met  2.  Pt will produce simple phoneme/ bilabial+/a/  /a/, /ba/, /ma/, and/or /pa/ simultaneously with SLP 65% success (equal # reps/stimulus) in 2 sessions Baseline:  Goal status: deferred - pt to focus on  salient items/terms  3.  Pt will demo understanding of simple commands in context 80% over three sessions Baseline: 08-25-22, 09-01-22 Goal status: Partially met  4.  Pt will indicate understanding of a complex sentence 85% of the time in 3 sessions (precursor for speech generating device) Baseline:  Goal status: not met  5.  Pt will copy written family names with 50% success in two sessions  Baseline:  Goal Status: met   LONG TERM GOALS: Target date: 10/24/2022  (or 20th visit)  Pt will name 6 salient family names when given picture and written name with consistent mod cues in 3 sessions (precursor for speech generating device) Baseline:  Goal status: Ongoing  2.  Pt will IMITATE simple phoneme/bilabial+/a/  /a/, /ba/, /ma/, and/or /pa/  65% success (equal # reps/stimulus) in 2 sessions Baseline:  Goal status: deferred - working with pt on more salient words  3.  Pt will generate meaningful message understood by [name] using speech generating device 60% of the time in 3 sessions Baseline:  Goal status: Ongoing  4.  [name] will indicate  that spontaneous communication is easier than during week of evaluation Baseline:  Goal status: Ongoing   ASSESSMENT:  CLINICAL IMPRESSION: He is a 61 y.o. male who cont to be seen today for treatment of verbal apraxia (as pt often indicates he knows the response but knows it will not be articulated correctly) and aphasia. Lingraphica referral to be made this week. SEE [place] NOTE from today for more details.   OBJECTIVE IMPAIRMENTS include expressive language, receptive language, and aphasia. These impairments are limiting patient from return to work, managing medications, managing appointments, household responsibilities, ADLs/IADLs, and effectively communicating at home and in community. Factors affecting potential to achieve goals and functional outcome are ability to learn/carryover information and severity of impairments. Patient will benefit from  skilled SLP services to address above impairments and improve overall function.  REHAB POTENTIAL: Good  PLAN: SLP FREQUENCY: 2x/week  SLP DURATION: 12 weeks  PLANNED INTERVENTIONS: Language facilitation, Environmental controls, Cueing hierachy, Internal/external aids, Functional tasks, and Multimodal communication approach    Carmen Tolliver, CCC-SLP 09/08/2022, 5:09 PM

## 2022-09-08 NOTE — Therapy (Signed)
OUTPATIENT OCCUPATIONAL THERAPY NEURO TREATMENT NOTE  Patient Name: Gordon Fisher MRN: [id number] DOB:03-27-1961, 61 y.o., male Today's Date: 09/08/2022  PCP: none on file REFERRING PROVIDER: Jennye Boroughs, MD    OT End of Session - 09/08/22 1405     Visit Number 11    Number of Visits 17    Date for OT Re-Evaluation 09/24/22    Authorization Type [employer]    Authorization Time Period VL: 60 combined (OT/PT/ST)    Authorization - Visit Number 11    Authorization - Number of Visits 20    OT Start Time 5726    OT Stop Time 1445    OT Time Calculation (min) 42 min    Activity Tolerance Patient tolerated treatment well    Behavior During Therapy WFL for tasks assessed/performed                Past Medical History:  Diagnosis Date   Stroke [facility])    Past Surgical History:  Procedure Laterality Date   BUBBLE STUDY  02/15/2022   Procedure: BUBBLE STUDY;  Surgeon: Werner Lean, MD;  Location: [place];  Service: Cardiovascular;;   IR CT HEAD LTD  02/11/2022   IR CT HEAD LTD  02/11/2022   IR CT HEAD LTD  02/11/2022   IR PERCUTANEOUS ART THROMBECTOMY/INFUSION INTRACRANIAL INC DIAG ANGIO  02/11/2022   IR PERCUTANEOUS ART THROMBECTOMY/INFUSION INTRACRANIAL INC DIAG ANGIO  02/11/2022   IR US GUIDE VASC ACCESS RIGHT  02/11/2022   IR US GUIDE VASC ACCESS RIGHT  02/11/2022   RADIOLOGY WITH ANESTHESIA N/A 02/11/2022   Procedure: IR WITH ANESTHESIA;  Surgeon: Radiologist, Medication, MD;  Location: [place];  Service: Radiology;  Laterality: N/A;   RADIOLOGY WITH ANESTHESIA N/A 02/11/2022   Procedure: RADIOLOGY WITH ANESTHESIA;  Surgeon: Radiologist, Medication, MD;  Location: [place];  Service: Radiology;  Laterality: N/A;   TEE WITHOUT CARDIOVERSION N/A 02/15/2022   Procedure: TRANSESOPHAGEAL ECHOCARDIOGRAM (TEE);  Surgeon: Werner Lean, MD;  Location: [facility] ENDOSCOPY;  Service: Cardiovascular;  Laterality: N/A;   Patient Active Problem List   Diagnosis  Date Noted   Hypocalcemia 03/22/2022   DVT, lower extremity, distal ([place]) 03/22/2022   Obesity 03/22/2022   Elevated LDL cholesterol level 03/22/2022   Acute ischemic left middle cerebral artery (MCA) stroke ([place]) 02/18/2022   Cerebrovascular accident (CVA) due to occlusion of left middle cerebral artery ([place]) 02/11/2022    ONSET DATE: referral 07/15/22 (CVA 02/11/22)  REFERRING DIAG: I63.9 (ICD-10-CM) - Cerebrovascular accident (CVA), unspecified mechanism ([place]) R13.10 (ICD-10-CM) - Dysphagia, unspecified type   THERAPY DIAG:  Hemiplegia and hemiparesis following cerebral infarction affecting right dominant side (HCC)  Other lack of coordination  Other disturbances of skin sensation  Muscle weakness (generalized)  Unsteadiness on feet  Other abnormalities of gait and mobility  Rationale for Evaluation and Treatment Rehabilitation  SUBJECTIVE:   SUBJECTIVE STATEMENT: Pt's wife reports that pt is doing much better with UB and LB dressing tasks. Pt accompanied by: self  PERTINENT HISTORY: Ischemic L MCA CVA w/ residual R-sided hemiparesis 02/11/22; in relatively good health prior to onset  PRECAUTIONS: Fall  PAIN: Are you having pain? No  FALLS: Has patient fallen in last 6 months? Yes. Number of falls 1  PLOF: Independent, Independent with basic ADLs, and Independent with gait  PATIENT GOALS: continue to make gains, independence with getting dressed   OBJECTIVE:   HAND DOMINANCE: Right  FUNCTIONAL OUTCOME MEASURES: FOTO: 11  UPPER EXTREMITY ROM  Active ROM: Pt with no AROM during evaluation.  Pt was able to elicit min shoulder elevation and slight gross grasp when cued and provided increased time to initiate movement.    Passive ROM Right Eval - 9/25  Shoulder flexion 100  Shoulder abduction   Shoulder adduction   Shoulder extension   Shoulder internal rotation WNL  Shoulder external rotation onset of pain >5-10* from neutral  Middle trapezius    Lower trapezius   Elbow flexion WNL  Elbow extension WNL  Wrist flexion WNL  Wrist extension WNL  Wrist ulnar deviation   Wrist radial deviation   Wrist pronation   Wrist supination   (Blank rows = not tested)  HAND FUNCTION: No functional grasp. Trace gross grasp with increased time for initiation  SENSATION: Difficult to assess due to expressive aphasia  MUSCLE TONE: RUE: Mild and Hypertonic  ------------------------------------------------------------------------------------------------------------------------------------------------------------ (objective measures above completed at initial evaluation unless otherwise dated)  TODAY'S TREATMENT:  09/08/22 Sidelying Exercises: Attempted towel slides for R shoulder flexion/extension in sidelying position with UE on tabletop. OT providing consistent tactile and verbal cues to decrease compensatory trunk movements (as pt eliciting internal rotation when attempting to activate shoulder flexion); intermittent physical assist for AAROM. Pt able to achieve poor activation with ext > flex.  OT facilitated elbow flexion/extension in sidelying position with UE on tabletop.  Pt demonstrating improved elbow flexion/extension compared to shoulder, however still benefiting from tactile and verbal cues to minimize compensatory trunk movements. Towel slides for R shoulder ER while sitting EOM, focusing on AAROM and gentle PROM with OT providing education on benefit of ER for scapular stability. Also facilitated supination/pronation and wrist flexion/extension. Pt able to achieve poor supination after PROM and use of compensatory trunk movements.  Provided with HEP with pictures to further facilitate carryover of self-ROM with supination/pronation and wrist flexion/extension.  Pt requiring hand over hand for correct LUE placement, wife able to provide cues as needed.    09/06/22 NMR/WB through elbows: OT facilitating positioning and alignment of RUE  with appropriate WB through elbow and in to shoulder during reaching activity.  Engaged in reaching for cards with LUE across midline to further facilitate WB through RUE, OT providing positioning to further facilitate WB.  Incorporated cognitive challenge to matching cards based on number, pt requiring increased time 20% of time but no verbal cues for attention to task or correct sorting.   Towel glides: LUE over RUE to further facilitate ROM with shoulder flexion/extension and horizontal abduction/adduction.  OT providing min cues for technique, fading to supervision.  OT placed targets in semi-circle to facilitate increased reach, directed to alternate colors for change in direction and cognitive challenge. AAROM: forward reaching with OT providing support under elbow to maintain proper positioning of R shoulder.  Pt able to initiate movement with min-mod compensatory strategies at trunk but able to recognize.  Supination/pronation on table top with hand over hand to facilitate movement.  OT providing initial facilitation, pt able to complete self-ROM with repetition and min cues.    09/01/22 UE Ranger: Engaged in shoulder flexion/extension and horizontal abduction/adduction with UE Ranger.  OT providing tactile support and facilitation at elbow to maintain good shoulder positioning.  OT modified task to completing with hemi-walker on 2 legs.  OT initially facilitating by placing hand over hand to maintain sustained grasp, transitioning to providing support at R elbow to facilitate approximation of R shoulder joint.  Pt able to complete flexion/extension with hemi-walker. NMR/WB through RUE: standing with  WB through R hand and then over half ball on table due to tone in fingers with extension/WB.  Engaged in reaching for cards with LUE across midline to further facilitate WB through RUE, OT providing positioning to further facilitate WB.  Incorporated cognitive challenge to matching cards based on number,  pt requiring mod cues for attention to task and correct sorting.   Dynamic standing: OT facilitating weight shifting through hips with exaggerated mobility with bumping OT hip on R and wife hip on L to increase weight shifting and acceptance of WB through RLE while in standing.  Discussed functional carryover of importance of weight shifting.  Utilized Geologist, engineering for [employer] for weight shifting during blocked practice and when transitioning back to card sorting activity.     PATIENT EDUCATION: Ongoing condition-specific education related to therapeutic interventions completed this session, Self-ROM HEP  Person educated: Patient and Spouse Education method: Explanation, Demonstration, Tactile cues, Verbal cues, and Handouts Education comprehension: verbalized understanding and needs further education   HOME EXERCISE PROGRAM: Written exercises of shoulder and elbow in supine with cues for hand placement and facilitation.   Self-ROM handout from [facility] lab  [place] Access Code: [email or web address] URL: [email or web address] - Supine Shoulder Flexion AAROM with Hands Clasped  - 1 x daily - 1 sets - 10 reps - Seated Elbow Extension and Shoulder External Rotation AAROM at Table with Towel  - 1 x daily - 1 sets - 10 reps - Finger Extension with Wrist Extension Caregiver PROM  - 1 x daily - 1 sets - 10 reps  GOALS: Goals reviewed with patient? Yes  SHORT TERM GOALS: Target date: 08/27/22  Pt and spouse will be independent with PROM and self-ROM HEP for shoulder stability and ROM. Baseline: Goal status: MET - 08/30/22  2.  Pt will verbalize understanding of task modifications and/or potential A/E needs to increase ease, safety, and independence w/ ADLs Baseline:  Goal status: MET -08/30/22  3.  Pt will verbalize understanding of strategies and use of RUE as stabilizer during ADLs. Baseline:  Goal status: MET - 08/30/22   LONG TERM GOALS: Target date: 09/24/22  Pt will  demonstrate ability to complete UB dressing with supervision with use of hemi-dressing technique. Baseline:  Goal status: IN PROGRESS  2.  Pt will demonstrate improved awareness of and positioning of RUE during mobility and self-care tasks. Baseline:  Goal status: IN PROGRESS  3.  Pt will utilize RUE as gross assist during grooming tasks (such as washing hands and applying toothpaste). Baseline:  Goal status: IN PROGRESS  4.  Pt will verbalize and or demonstrate increased functional use of BUE during LB dressing to complete at supervision level. Baseline:  Goal status: IN PROGRESS  5.  Pt will verbalize improved functional incorporation of RUE with ADLs by reporting improved FOTO score to >/= to 20. Baseline: 11 Goal status: IN PROGRESS   ASSESSMENT:  CLINICAL IMPRESSION: Session today with focus on continued NMR of RUE in sidelying and sitting position.  Pt demonstrating significant compensatory trunk movements to activate shoulder and elbow mobility.  Pt demonstrating increased elbow > shoulder movements in sidelying.  Continued difficulty with shoulder AROM; R shoulder subluxation likely contributing to limitations.  Pt benefits from multimodal cues for hand placement during Self-ROM and cues to decrease compensatory trunk movements.  PERFORMANCE DEFICITS in functional skills including ADLs, IADLs, coordination, sensation, tone, ROM, strength, pain, flexibility, FMC, GMC, mobility, balance, body mechanics, vision, and UE functional use and psychosocial skills including environmental adaptation  and routines and behaviors.   IMPAIRMENTS are limiting patient from ADLs and IADLs.   COMORBIDITIES may have co-morbidities  that affects occupational performance. Patient will benefit from skilled OT to address above impairments and improve overall function.   PLAN: OT FREQUENCY: 2x/week  OT DURATION: 8 weeks  PLANNED INTERVENTIONS: self care/ADL training, therapeutic exercise,  therapeutic activity, neuromuscular re-education, manual therapy, passive range of motion, balance training, functional mobility training, splinting, electrical stimulation, compression bandaging, moist heat, cryotherapy, patient/family education, visual/perceptual remediation/compensation, psychosocial skills training, energy conservation, coping strategies training, and DME and/or AE instructions  RECOMMENDED OTHER SERVICES: N/A  CONSULTED AND AGREED WITH PLAN OF CARE: Patient and family member/caregiver  PLAN FOR NEXT SESSION: Continue w/ scapular stability: shoulder shrugs, rolls, and scapular retraction; WB and dynamic standing, supine and sidelying NMR.  SRA lab self-ROM exercises at next session.   Simonne Come, OTR/L 09/08/2022, 2:05 PM

## 2022-09-09 ENCOUNTER — Ambulatory Visit (INDEPENDENT_AMBULATORY_CARE_PROVIDER_SITE_OTHER): Payer: 59

## 2022-09-09 DIAGNOSIS — I639 Cerebral infarction, unspecified: Secondary | ICD-10-CM

## 2022-09-09 LAB — CUP PACEART REMOTE DEVICE CHECK
Date Time Interrogation Session: 20231109083155
Implantable Pulse Generator Implant Date: 20230807
Pulse Gen Serial Number: 511016023

## 2022-09-13 ENCOUNTER — Encounter: Payer: Self-pay | Admitting: Physical Therapy

## 2022-09-13 ENCOUNTER — Ambulatory Visit: Payer: 59 | Admitting: Physical Therapy

## 2022-09-13 ENCOUNTER — Ambulatory Visit: Payer: 59 | Admitting: Occupational Therapy

## 2022-09-13 ENCOUNTER — Ambulatory Visit: Payer: 59

## 2022-09-13 DIAGNOSIS — I69351 Hemiplegia and hemiparesis following cerebral infarction affecting right dominant side: Secondary | ICD-10-CM | POA: Diagnosis not present

## 2022-09-13 DIAGNOSIS — R482 Apraxia: Secondary | ICD-10-CM

## 2022-09-13 DIAGNOSIS — M6281 Muscle weakness (generalized): Secondary | ICD-10-CM

## 2022-09-13 DIAGNOSIS — R2689 Other abnormalities of gait and mobility: Secondary | ICD-10-CM

## 2022-09-13 DIAGNOSIS — R208 Other disturbances of skin sensation: Secondary | ICD-10-CM

## 2022-09-13 DIAGNOSIS — R278 Other lack of coordination: Secondary | ICD-10-CM

## 2022-09-13 DIAGNOSIS — R2681 Unsteadiness on feet: Secondary | ICD-10-CM

## 2022-09-13 DIAGNOSIS — R4701 Aphasia: Secondary | ICD-10-CM

## 2022-09-13 NOTE — Therapy (Signed)
OUTPATIENT PHYSICAL THERAPY NEURO TREATMENT   Patient Name: Gordon Fisher MRN: [id number] DOB:[birth year], 61 y.o., male Today's Date: 09/13/2022   PCP: None listed REFERRING PROVIDER: Jennye Boroughs, MD    PT End of Session - 09/13/22 1232     Visit Number 12    Number of Visits 16    Date for PT Re-Evaluation 09/20/22    Authorization Type [employer] [id number]    Authorization - Visit Number 12    Authorization - Number of Visits 20    Progress Note Due on Visit 10    PT Start Time 1233    PT Stop Time 1315    PT Time Calculation (min) 42 min    Equipment Utilized During Treatment Gait belt    Activity Tolerance Patient tolerated treatment well    Behavior During Therapy WFL for tasks assessed/performed                  Past Medical History:  Diagnosis Date   Stroke [facility])    Past Surgical History:  Procedure Laterality Date   BUBBLE STUDY  02/15/2022   Procedure: BUBBLE STUDY;  Surgeon: Werner Lean, MD;  Location: [place];  Service: Cardiovascular;;   IR CT HEAD LTD  02/11/2022   IR CT HEAD LTD  02/11/2022   IR CT HEAD LTD  02/11/2022   IR PERCUTANEOUS ART THROMBECTOMY/INFUSION INTRACRANIAL INC DIAG ANGIO  02/11/2022   IR PERCUTANEOUS ART THROMBECTOMY/INFUSION INTRACRANIAL INC DIAG ANGIO  02/11/2022   IR US GUIDE VASC ACCESS RIGHT  02/11/2022   IR US GUIDE VASC ACCESS RIGHT  02/11/2022   RADIOLOGY WITH ANESTHESIA N/A 02/11/2022   Procedure: IR WITH ANESTHESIA;  Surgeon: Radiologist, Medication, MD;  Location: [place];  Service: Radiology;  Laterality: N/A;   RADIOLOGY WITH ANESTHESIA N/A 02/11/2022   Procedure: RADIOLOGY WITH ANESTHESIA;  Surgeon: Radiologist, Medication, MD;  Location: [place];  Service: Radiology;  Laterality: N/A;   TEE WITHOUT CARDIOVERSION N/A 02/15/2022   Procedure: TRANSESOPHAGEAL ECHOCARDIOGRAM (TEE);  Surgeon: Werner Lean, MD;  Location: [facility];  Service: Cardiovascular;  Laterality: N/A;   Patient Active Problem  List   Diagnosis Date Noted   Hypocalcemia 03/22/2022   DVT, lower extremity, distal ([place]) 03/22/2022   Obesity 03/22/2022   Elevated LDL cholesterol level 03/22/2022   Acute ischemic left middle cerebral artery (MCA) stroke ([place]) 02/18/2022   Cerebrovascular accident (CVA) due to occlusion of left middle cerebral artery ([place]) 02/11/2022    ONSET DATE: April 2023  REFERRING DIAG: I63.9 (ICD-10-CM) - Cerebrovascular accident (CVA), unspecified mechanism ([place]) R13.10 (ICD-10-CM) - Dysphagia, unspecified type   THERAPY DIAG:  Unsteadiness on feet  Muscle weakness (generalized)  Other abnormalities of gait and mobility  Rationale for Evaluation and Treatment Rehabilitation  SUBJECTIVE:  SUBJECTIVE STATEMENT: Wife says no changes-just trying to keep up with exercises and therapy.  Wife says still waiting for the brace appointment.  Pt accompanied by: significant other  PERTINENT HISTORY: 02/11/2022 patient developed right-sided weakness, aphasia, went to hospital for evaluation.  Code stroke was activated.  He received TNK and then transferred to [facility].  He underwent cerebral angiogram with thrombectomy.  Unfortunately vessel reoccluded later the day and the second thrombectomy was attempted.  Left M2 dissection was identified and treated with stent but unfortunately stent demonstrated reocclusion   PAIN:  Are you having pain? No  PRECAUTIONS: Shoulder and Fall, Aphasia   WEIGHT BEARING RESTRICTIONS No  FALLS: Has patient fallen in last 6 months? Yes. Number of falls 1, required 3-assist to recover  LIVING ENVIRONMENT: Lives with: lives with their spouse Lives in: House/apartment Stairs: Yes: Internal: flight steps; bilateral but cannot reach both and External: 4-5 steps; ramp is  also present.  Has following equipment at home: Hemi walker, Wheelchair (manual), and sit-stand lift  PLOF: Independent  PATIENT GOALS regain independence  OBJECTIVE:    TODAY'S TREATMENT: 09/13/2022 Activity Comments  Standing in front of mirror, working on L weight shift: Single step taps to disks Double step taps to disks Holding quad cane for support, cues to lighten support at quad cane  Stagger stance position, RLE posterior position with head turns/head nods No UE support, min guard  Feet together with head turns/head nods; feet together with EC 3 x 10 sec No UE support, min guard  RLE as stance with LLE hip/knee flexion x 10 reps then alternating marching x 10 Min assist, difficulty with knee flexion RLE  Seated hamstring/quad activation with RLE heelslides, 2 x 5 reps with assist Cues to breathe  Sit<>stand with RLE posterior, 10 reps, then 5 additional reps With PLS brace on  Gait 255 ft with QC wearing clinic PLS brace, step-through pattern Min assist, with cues for increased RLE stance time and LLE step length      Access Code: [email or web address] URL: [email or web address] Date: 09/13/2022-additions to HEP Prepared by: [facility]  Exercises - Forward Backward Weight Shift with Counter Support  - 1 x daily - 5 x weekly - 2 sets - 10 reps - Side to Side Weight Shift with Counter Support  - 1 x daily - 5 x weekly - 2 sets - 10 reps - Mini Squat with Counter Support  - 1 x daily - 5 x weekly - 2 sets - 10 reps - Stride Stance Weight Shift  - 1 x daily - 5 x weekly - 2 sets - 10 reps - Sit to stand in stride stance  - 2 x daily - 7 x weekly - 1-2 sets - 5 reps     Below measures were taken at time of initial evaluation unless otherwise specified:     DIAGNOSTIC FINDINGS: Follow-up MRI brain done revealing small areas of acute infarct in left-MCA territory with mild hemorrhagic transformation in left parietal lobe and left-MCA  bifurcation residual vascular thrombus   COGNITION: Overall cognitive status:  Difficult to assess due to aphasia   SENSATION: Light touch: Impaired  Proprioception: Impaired   COORDINATION: RLE impaired, some deficits on left foot with alternating movements and isolated movements  EDEMA:  None   MUSCLE TONE: RLE: Modifed Ashworth Scale 2 = More marked increase in muscle tone through most of the ROM, but affected part(s) easily moved right quad  most affected, no clonus right plantarflexors   MUSCLE LENGTH: Able to achieve full right knee extension to PROM  DTRs:  Patella 3+ = Brisk  POSTURE: No Significant postural limitations  LOWER EXTREMITY ROM:     LLE WNL, RLE AROM limited by weakness  LOWER EXTREMITY MMT:    MMT Right Eval Left Eval  Hip flexion 3- 5  Hip extension    Hip abduction 3- 4  Hip adduction 3- 5  Hip internal rotation    Hip external rotation    Knee flexion 3 5  Knee extension 2+ 5  Ankle dorsiflexion 0 5  Ankle plantarflexion 1 3+  Ankle inversion    Ankle eversion    (Blank rows = not tested)  BED MOBILITY:  NT  TRANSFERS: Assistive device utilized: Hemi walker  Sit to stand: Modified independence Stand to sit: SBA Chair to chair: SBA and CGA Floor:  Total assist  RAMP:  Level of Assistance: CGA Assistive device utilized: Hemi walker Ramp Comments:   CURB:  Level of Assistance: Min A Assistive device utilized: Nutritional therapist Comments: cues in sequence  STAIRS:  Level of Assistance: CGA and Min A  Stair Negotiation Technique: Step to Pattern with Single Rail on Left  Number of Stairs: 10   Height of Stairs: 4-6"  Comments: cues and guarding  GAIT: Gait pattern: step to pattern, decreased ankle dorsiflexion- Right, and circumduction- Right Distance walked: 82 Assistive device utilized: Hemi walker Level of assistance: CGA Comments: deficits during turning  FUNCTIONAL TESTs:  5 times sit to stand: 11.59 sec with 3  retro LOB and poor eccentric control Timed up and go (TUG): 34.75 sec Berg Balance Scale: 32/56 Dynamic Gait Index: NT 2 minute walk test: 82 ft w/ hemi-walker, 0.68 ft/sec  M-CTSIB  Condition 1: Firm Surface, EO 30 Sec, Normal and Mild Sway  Condition 2: Firm Surface, EC 30 Sec, Mild and Moderate Sway  Condition 3: Foam Surface, EO 30 Sec, Mild Sway  Condition 4: Foam Surface, EC 26 Sec, Moderate and Severe Sway     PATIENT SURVEYS:  FOTO 47%  TODAY'S TREATMENT:  assessment   PATIENT EDUCATION: Education details: assessment findings, PT scope of practice Person educated: Patient and Spouse Education method: Explanation Education comprehension: verbalized understanding   HOME EXERCISE PROGRAM: TBD    GOALS: Goals reviewed with patient? Yes  SHORT TERM GOALS: Target date: 08/23/2022  Patient will perform HEP with family/caregiver supervision for improved strength, balance, transfers, and gait  Baseline: Goal status: GOAL MET, 08/23/2022  2.  Ambulate 12 stairs with left HR and supervision to improve safety within home Baseline: CGA-min A; per report CGA/supervision 6 steps Goal status: PARTIALLY MET, 08/23/2022  3.  Demonstrate improved safety and efficiency with ambulation per distance of 100 ft during 2MWT Baseline: 82 ft w/ HW and CGA; 111 ft 08/23/2022 Goal status: GOAL MET, 08/23/2022    LONG TERM GOALS: Target date: 09/20/2022  Demonstrate improved balance and reduced risk for falls per score 40/56 Berg Balance Test Baseline: 32/56 Goal status:IN PROGRESS  2.  Demonstrate modified independent ambulation x 150 ft level surfaces using least restrictive AD in order to improve safety with home environment Baseline: CGA w/ HW Goal status: IN PROGRESS  3.  Manifest improved safety with gait per time of 18 sec TUG test using least restrictive AD Baseline: 34.75 sec w/ HW Goal status: IN PROGRESS    ASSESSMENT:  CLINICAL IMPRESSION: Skilled PT  session today focused on NMR  and strengthening activities for RLE.  Utilized mirror for cues, and with repetitions throughout exercises, pt demonstrates improved ease of movement, improved posture and weightshift through RLE.  Added to HEP for stride stance sit<>Stand today for pt to practice with wife's supervision at home.  Gait training with clinic PLS AFO and quad cane, with pt ambulating with step through pattern, increased stance time still on LLE versus RLE.  Pt will continue to benefit from skilled PT towards LTGs for improved functional mobility, strength, balance and gait towards more independent functional mobility and decreased fall risk.  OBJECTIVE IMPAIRMENTS Abnormal gait, decreased activity tolerance, decreased balance, decreased coordination, decreased knowledge of use of DME, decreased mobility, difficulty walking, decreased strength, impaired sensation, impaired tone, impaired UE functional use, and improper body mechanics.   ACTIVITY LIMITATIONS carrying, lifting, bending, standing, squatting, stairs, transfers, bathing, toileting, dressing, reach over head, and locomotion level  PARTICIPATION LIMITATIONS: meal prep, cleaning, laundry, interpersonal relationship, driving, shopping, community activity, occupation, yard work, and leisure activities  [place] Time since onset of injury/illness/exacerbation are also affecting patient's functional outcome.   REHAB POTENTIAL: Excellent  CLINICAL DECISION MAKING: Evolving/moderate complexity  EVALUATION COMPLEXITY: Moderate  PLAN: PT FREQUENCY: 2x/week  PT DURATION: 8 weeks  PLANNED INTERVENTIONS: Therapeutic exercises, Therapeutic activity, Neuromuscular re-education, Balance training, Gait training, Patient/Family education, Self Care, Joint mobilization, Stair training, Vestibular training, Canalith repositioning, Orthotic/Fit training, DME instructions, Aquatic Therapy, Dry Needling, Electrical stimulation, Spinal  mobilization, Cryotherapy, Moist heat, Splintting, Taping, and Manual therapy  PLAN FOR NEXT SESSION: LTGs due 09/20/22, so check LTGs and discuss POC.  (May need to add more appointments). Consider forearm walker? Continue gait training with AFO trial.  Continue with RLE step ups; use of mirror for R wt shift, RLE forced weightbearing, quadruped?  Mady Haagensen, PT 09/13/22 1:25 PM Phone: [phone] Fax: [phone]   [facility] [address] [place], [place] [address] Phone # [phone] Fax # [phone]

## 2022-09-13 NOTE — Therapy (Signed)
OUTPATIENT OCCUPATIONAL THERAPY NEURO TREATMENT NOTE  Patient Name: Gordon Fisher MRN: [id number] DOB:04-23-1961, 61 y.o., male Today's Date: 09/13/2022  PCP: none on file REFERRING PROVIDER: Jennye Boroughs, MD    OT End of Session - 09/13/22 1333     Visit Number 12    Number of Visits 17    Date for OT Re-Evaluation 09/24/22    Authorization Type [employer]    Authorization Time Period VL: 60 combined (OT/PT/ST)    Authorization - Visit Number 12    Authorization - Number of Visits 20    OT Start Time 9381    OT Stop Time 1400    OT Time Calculation (min) 43 min    Activity Tolerance Patient tolerated treatment well    Behavior During Therapy WFL for tasks assessed/performed                 Past Medical History:  Diagnosis Date   Stroke [facility])    Past Surgical History:  Procedure Laterality Date   BUBBLE STUDY  02/15/2022   Procedure: BUBBLE STUDY;  Surgeon: Werner Lean, MD;  Location: [place];  Service: Cardiovascular;;   IR CT HEAD LTD  02/11/2022   IR CT HEAD LTD  02/11/2022   IR CT HEAD LTD  02/11/2022   IR PERCUTANEOUS ART THROMBECTOMY/INFUSION INTRACRANIAL INC DIAG ANGIO  02/11/2022   IR PERCUTANEOUS ART THROMBECTOMY/INFUSION INTRACRANIAL INC DIAG ANGIO  02/11/2022   IR US GUIDE VASC ACCESS RIGHT  02/11/2022   IR US GUIDE VASC ACCESS RIGHT  02/11/2022   RADIOLOGY WITH ANESTHESIA N/A 02/11/2022   Procedure: IR WITH ANESTHESIA;  Surgeon: Radiologist, Medication, MD;  Location: [place];  Service: Radiology;  Laterality: N/A;   RADIOLOGY WITH ANESTHESIA N/A 02/11/2022   Procedure: RADIOLOGY WITH ANESTHESIA;  Surgeon: Radiologist, Medication, MD;  Location: [place];  Service: Radiology;  Laterality: N/A;   TEE WITHOUT CARDIOVERSION N/A 02/15/2022   Procedure: TRANSESOPHAGEAL ECHOCARDIOGRAM (TEE);  Surgeon: Werner Lean, MD;  Location: [facility] ENDOSCOPY;  Service: Cardiovascular;  Laterality: N/A;   Patient Active Problem List    Diagnosis Date Noted   Hypocalcemia 03/22/2022   DVT, lower extremity, distal ([place]) 03/22/2022   Obesity 03/22/2022   Elevated LDL cholesterol level 03/22/2022   Acute ischemic left middle cerebral artery (MCA) stroke ([place]) 02/18/2022   Cerebrovascular accident (CVA) due to occlusion of left middle cerebral artery ([place]) 02/11/2022    ONSET DATE: referral 07/15/22 (CVA 02/11/22)  REFERRING DIAG: I63.9 (ICD-10-CM) - Cerebrovascular accident (CVA), unspecified mechanism ([place]) R13.10 (ICD-10-CM) - Dysphagia, unspecified type   THERAPY DIAG:  Hemiplegia and hemiparesis following cerebral infarction affecting right dominant side (HCC)  Other lack of coordination  Other disturbances of skin sensation  Muscle weakness (generalized)  Rationale for Evaluation and Treatment Rehabilitation  SUBJECTIVE:   SUBJECTIVE STATEMENT: Pt's wife reports that they helped/supervised butchering some chickens over the weekend. Pt accompanied by: self and spouse   PERTINENT HISTORY: Ischemic L MCA CVA w/ residual R-sided hemiparesis 02/11/22; in relatively good health prior to onset  PRECAUTIONS: Fall  PAIN: Are you having pain? No  FALLS: Has patient fallen in last 6 months? Yes. Number of falls 1  PLOF: Independent, Independent with basic ADLs, and Independent with gait  PATIENT GOALS: continue to make gains, independence with getting dressed   OBJECTIVE:   HAND DOMINANCE: Right  FUNCTIONAL OUTCOME MEASURES: FOTO: 11  UPPER EXTREMITY ROM    Active ROM: Pt with no AROM during evaluation.  Pt  was able to elicit min shoulder elevation and slight gross grasp when cued and provided increased time to initiate movement.    Passive ROM Right Eval - 9/25  Shoulder flexion 100  Shoulder abduction   Shoulder adduction   Shoulder extension   Shoulder internal rotation WNL  Shoulder external rotation onset of pain >5-10* from neutral  Middle trapezius   Lower trapezius   Elbow flexion WNL   Elbow extension WNL  Wrist flexion WNL  Wrist extension WNL  Wrist ulnar deviation   Wrist radial deviation   Wrist pronation   Wrist supination   (Blank rows = not tested)  HAND FUNCTION: No functional grasp. Trace gross grasp with increased time for initiation  SENSATION: Difficult to assess due to expressive aphasia  MUSCLE TONE: RUE: Mild and Hypertonic  ------------------------------------------------------------------------------------------------------------------------------------------------------------ (objective measures above completed at initial evaluation unless otherwise dated)  TODAY'S TREATMENT:  09/13/22 Trunk control: Engaged in lateral scoots towards R with focus on trunk control and WB through RUE during transitional movements.  OT providing hand over hand for increased WB and positioning of RUE and verbal cues to facilitate increased activation of hip movement to move RLE instead of picking up to advance. NMR exercises in supine for increased scapular support and gravity-minimized/eliminated positioning.  OT facilitating shoulder and elbow flexion/extension in supine position with tactile cues and repetition for increased carryover.  OT initiating each movement with PROM, followed by  OT providing tapping at biceps to facilitate elbow flexion, with pt then attempting AAROM.  Pt able to replicate elbow flexion/extension with only hand support to minimize effects of gravity.  Pt able to elicit trace shoulder flexion/extension, requiring increased facilitation with minimal carryover.  Pt continues to demonstrate internal rotation when attempting to activate shoulder flexion/extension. Shoulder flexion on hemi-walker on 2 legs: OT initially facilitating by placing hand over hand to maintain sustained grasp, transitioning to providing support at R elbow to facilitate approximation of R shoulder joint.  Pt able to complete flexion/extension with hemi-walker with increased  effort.      09/08/22 Sidelying Exercises: Attempted towel slides for R shoulder flexion/extension in sidelying position with UE on tabletop. OT providing consistent tactile and verbal cues to decrease compensatory trunk movements (as pt eliciting internal rotation when attempting to activate shoulder flexion); intermittent physical assist for AAROM. Pt able to achieve poor activation with ext > flex.  OT facilitated elbow flexion/extension in sidelying position with UE on tabletop.  Pt demonstrating improved elbow flexion/extension compared to shoulder, however still benefiting from tactile and verbal cues to minimize compensatory trunk movements. Towel slides for R shoulder ER while sitting EOM, focusing on AAROM and gentle PROM with OT providing education on benefit of ER for scapular stability. Also facilitated supination/pronation and wrist flexion/extension. Pt able to achieve poor supination after PROM and use of compensatory trunk movements.  Provided with HEP with pictures to further facilitate carryover of self-ROM with supination/pronation and wrist flexion/extension.  Pt requiring hand over hand for correct LUE placement, wife able to provide cues as needed.    09/06/22 NMR/WB through elbows: OT facilitating positioning and alignment of RUE with appropriate WB through elbow and in to shoulder during reaching activity.  Engaged in reaching for cards with LUE across midline to further facilitate WB through RUE, OT providing positioning to further facilitate WB.  Incorporated cognitive challenge to matching cards based on number, pt requiring increased time 20% of time but no verbal cues for attention to task or correct sorting.  Towel glides: LUE over RUE to further facilitate ROM with shoulder flexion/extension and horizontal abduction/adduction.  OT providing min cues for technique, fading to supervision.  OT placed targets in semi-circle to facilitate increased reach, directed to alternate  colors for change in direction and cognitive challenge. AAROM: forward reaching with OT providing support under elbow to maintain proper positioning of R shoulder.  Pt able to initiate movement with min-mod compensatory strategies at trunk but able to recognize.  Supination/pronation on table top with hand over hand to facilitate movement.  OT providing initial facilitation, pt able to complete self-ROM with repetition and min cues.     PATIENT EDUCATION: Ongoing condition-specific education related to therapeutic interventions completed this session, Self-ROM HEP  Person educated: Patient and Spouse Education method: Explanation, Demonstration, Tactile cues, Verbal cues, and Handouts Education comprehension: verbalized understanding and needs further education   HOME EXERCISE PROGRAM: Written exercises of shoulder and elbow in supine with cues for hand placement and facilitation.   Self-ROM handout from [facility] lab  [place] Access Code: [email or web address] URL: [email or web address] - Supine Shoulder Flexion AAROM with Hands Clasped  - 1 x daily - 1 sets - 10 reps - Seated Elbow Extension and Shoulder External Rotation AAROM at Table with Towel  - 1 x daily - 1 sets - 10 reps - Finger Extension with Wrist Extension Caregiver PROM  - 1 x daily - 1 sets - 10 reps  GOALS: Goals reviewed with patient? Yes  SHORT TERM GOALS: Target date: 08/27/22  Pt and spouse will be independent with PROM and self-ROM HEP for shoulder stability and ROM. Baseline: Goal status: MET - 08/30/22  2.  Pt will verbalize understanding of task modifications and/or potential A/E needs to increase ease, safety, and independence w/ ADLs Baseline:  Goal status: MET -08/30/22  3.  Pt will verbalize understanding of strategies and use of RUE as stabilizer during ADLs. Baseline:  Goal status: MET - 08/30/22   LONG TERM GOALS: Target date: 09/24/22  Pt will demonstrate ability to complete UB dressing with  supervision with use of hemi-dressing technique. Baseline:  Goal status: IN PROGRESS  2.  Pt will demonstrate improved awareness of and positioning of RUE during mobility and self-care tasks. Baseline:  Goal status: IN PROGRESS  3.  Pt will utilize RUE as gross assist during grooming tasks (such as washing hands and applying toothpaste). Baseline:  Goal status: IN PROGRESS  4.  Pt will verbalize and or demonstrate increased functional use of BUE during LB dressing to complete at supervision level. Baseline:  Goal status: IN PROGRESS  5.  Pt will verbalize improved functional incorporation of RUE with ADLs by reporting improved FOTO score to >/= to 20. Baseline: 11 Goal status: IN PROGRESS   ASSESSMENT:  CLINICAL IMPRESSION: Session today with focus on continued NMR of RUE in supine and sitting position.  Pt demonstrating significant compensatory trunk movements to activate shoulder and elbow mobility.  Pt demonstrating increased elbow > shoulder movements in supine and sidelying.  Continued difficulty with shoulder AROM; R shoulder subluxation likely contributing to limitations.  PERFORMANCE DEFICITS in functional skills including ADLs, IADLs, coordination, sensation, tone, ROM, strength, pain, flexibility, FMC, GMC, mobility, balance, body mechanics, vision, and UE functional use and psychosocial skills including environmental adaptation and routines and behaviors.   IMPAIRMENTS are limiting patient from ADLs and IADLs.   COMORBIDITIES may have co-morbidities  that affects occupational performance. Patient will benefit from skilled OT to address above impairments and improve overall function.  PLAN: OT FREQUENCY: 2x/week  OT DURATION: 8 weeks  PLANNED INTERVENTIONS: self care/ADL training, therapeutic exercise, therapeutic activity, neuromuscular re-education, manual therapy, passive range of motion, balance training, functional mobility training, splinting, electrical  stimulation, compression bandaging, moist heat, cryotherapy, patient/family education, visual/perceptual remediation/compensation, psychosocial skills training, energy conservation, coping strategies training, and DME and/or AE instructions  RECOMMENDED OTHER SERVICES: N/A  CONSULTED AND AGREED WITH PLAN OF CARE: Patient and family member/caregiver  PLAN FOR NEXT SESSION: Continue w/ scapular stability: shoulder shrugs, rolls, and scapular retraction; WB and dynamic standing, supine and sidelying NMR.  SRA lab self-ROM exercises at next session.   Simonne Come, OTR/L 09/13/2022, 1:34 PM

## 2022-09-14 NOTE — Therapy (Signed)
OUTPATIENT PHYSICAL THERAPY NEURO TREATMENT   Patient Name: Gordon Fisher MRN: [id number] DOB:1961/02/24, 61 y.o., male Today's Date: 09/15/2022   PCP: None listed REFERRING PROVIDER: Jennye Boroughs, MD    PT End of Session - 09/15/22 1319     Visit Number 13    Number of Visits 16    Date for PT Re-Evaluation 09/20/22    Authorization Type [employer] [id number]    Authorization - Visit Number 49    Authorization - Number of Visits 20    Progress Note Due on Visit 10    PT Start Time 1232    PT Stop Time 1315    PT Time Calculation (min) 43 min    Equipment Utilized During Treatment Gait belt    Activity Tolerance Patient tolerated treatment well    Behavior During Therapy WFL for tasks assessed/performed                   Past Medical History:  Diagnosis Date   Stroke [facility])    Past Surgical History:  Procedure Laterality Date   BUBBLE STUDY  02/15/2022   Procedure: BUBBLE STUDY;  Surgeon: Werner Lean, MD;  Location: [place];  Service: Cardiovascular;;   IR CT HEAD LTD  02/11/2022   IR CT HEAD LTD  02/11/2022   IR CT HEAD LTD  02/11/2022   IR PERCUTANEOUS ART THROMBECTOMY/INFUSION INTRACRANIAL INC DIAG ANGIO  02/11/2022   IR PERCUTANEOUS ART THROMBECTOMY/INFUSION INTRACRANIAL INC DIAG ANGIO  02/11/2022   IR US GUIDE VASC ACCESS RIGHT  02/11/2022   IR US GUIDE VASC ACCESS RIGHT  02/11/2022   RADIOLOGY WITH ANESTHESIA N/A 02/11/2022   Procedure: IR WITH ANESTHESIA;  Surgeon: Radiologist, Medication, MD;  Location: [place];  Service: Radiology;  Laterality: N/A;   RADIOLOGY WITH ANESTHESIA N/A 02/11/2022   Procedure: RADIOLOGY WITH ANESTHESIA;  Surgeon: Radiologist, Medication, MD;  Location: [place];  Service: Radiology;  Laterality: N/A;   TEE WITHOUT CARDIOVERSION N/A 02/15/2022   Procedure: TRANSESOPHAGEAL ECHOCARDIOGRAM (TEE);  Surgeon: Werner Lean, MD;  Location: [facility] ENDOSCOPY;  Service: Cardiovascular;  Laterality: N/A;   Patient Active Problem  List   Diagnosis Date Noted   Hypocalcemia 03/22/2022   DVT, lower extremity, distal ([place]) 03/22/2022   Obesity 03/22/2022   Elevated LDL cholesterol level 03/22/2022   Acute ischemic left middle cerebral artery (MCA) stroke ([place]) 02/18/2022   Cerebrovascular accident (CVA) due to occlusion of left middle cerebral artery ([place]) 02/11/2022    ONSET DATE: April 2023  REFERRING DIAG: I63.9 (ICD-10-CM) - Cerebrovascular accident (CVA), unspecified mechanism ([place]) R13.10 (ICD-10-CM) - Dysphagia, unspecified type   THERAPY DIAG:  Unsteadiness on feet  Muscle weakness (generalized)  Other abnormalities of gait and mobility  Rationale for Evaluation and Treatment Rehabilitation  SUBJECTIVE:  SUBJECTIVE STATEMENT: Wife says no changes.  Pt accompanied by: significant other  PERTINENT HISTORY: 02/11/2022 patient developed right-sided weakness, aphasia, went to hospital for evaluation.  Code stroke was activated.  He received TNK and then transferred to [facility].  He underwent cerebral angiogram with thrombectomy.  Unfortunately vessel reoccluded later the day and the second thrombectomy was attempted.  Left M2 dissection was identified and treated with stent but unfortunately stent demonstrated reocclusion   PAIN:  Are you having pain? No  PRECAUTIONS: Shoulder and Fall, Aphasia   WEIGHT BEARING RESTRICTIONS No  FALLS: Has patient fallen in last 6 months? Yes. Number of falls 1, required 3-assist to recover  LIVING ENVIRONMENT: Lives with: lives with their spouse Lives in: House/apartment Stairs: Yes: Internal: flight steps; bilateral but cannot reach both and External: 4-5 steps; ramp is also present.  Has following equipment at home: Hemi walker, Wheelchair (manual), and sit-stand  lift  PLOF: Independent  PATIENT GOALS regain independence  OBJECTIVE:     TODAY'S TREATMENT: 09/15/22 Activity Comments  Bridge 2x10 Adjusted R foot back dfor increased Wbing; R hip instability ; added ball between knees to control R hip ER  supine heel slides with pball 15x Initially with strap, then without, then added manual resistance; focused on keeping hip neutral   SL R hip ABD 2x10 PT assist to maintain proper alignment and avoid hip flexion   SL R hip ADD 2x10 PT manual assist; opposite LE resting on bolster   Sitting ball squeeze 2x5 3" Cueing for increased R LE participation   standing R wt shifts to touch R hip to cone, then added L foot step Much improved ability  Gait training with hemi walker x45f Cueing to shift hips R, then step [place]Last updated: 09/15/22 Access Code: [email or web address]: [email or web address] Date: 09/15/2022 Prepared by: [facility]  Exercises - Forward Backward Weight Shift with Counter Support  - 1 x daily - 5 x weekly - 2 sets - 10 reps - Side to Side Weight Shift with Counter Support  - 1 x daily - 5 x weekly - 2 sets - 10 reps - Mini Squat with Counter Support  - 1 x daily - 5 x weekly - 2 sets - 10 reps - Stride Stance Weight Shift  - 1 x daily - 5 x weekly - 2 sets - 10 reps - Sit to stand in stride stance  - 2 x daily - 7 x weekly - 1-2 sets - 5 reps - Supine Bridge with Mini Swiss Ball Between Knees  - 1 x daily - 5 x weekly - 2 sets - 10 reps - Supine Heel Slide  - 1 x daily - 5 x weekly - 2 sets - 10 reps - Seated Isometric Hip Adduction with Ball  - 1 x daily - 5 x weekly - 2 sets - 10 reps - 3 sec hold  PATIENT EDUCATION: Education details: HEP update Person educated: Patient and Spouse Education method: Explanation, Demonstration, Tactile cues, Verbal cues, and Handouts Education comprehension: verbalized understanding and returned  demonstration    Below measures were taken at time of initial evaluation unless otherwise specified:     DIAGNOSTIC FINDINGS: Follow-up MRI brain done revealing small areas of acute infarct in left-MCA territory with mild hemorrhagic transformation in left parietal lobe and left-MCA bifurcation residual vascular thrombus  COGNITION: Overall cognitive status:  Difficult to assess due to aphasia   SENSATION: Light touch: Impaired  Proprioception: Impaired   COORDINATION: RLE impaired, some deficits on left foot with alternating movements and isolated movements  EDEMA:  None   MUSCLE TONE: RLE: Modifed Ashworth Scale 2 = More marked increase in muscle tone through most of the ROM, but affected part(s) easily moved right quad most affected, no clonus right plantarflexors   MUSCLE LENGTH: Able to achieve full right knee extension to PROM  DTRs:  Patella 3+ = Brisk  POSTURE: No Significant postural limitations  LOWER EXTREMITY ROM:     LLE WNL, RLE AROM limited by weakness  LOWER EXTREMITY MMT:    MMT Right Eval Left Eval  Hip flexion 3- 5  Hip extension    Hip abduction 3- 4  Hip adduction 3- 5  Hip internal rotation    Hip external rotation    Knee flexion 3 5  Knee extension 2+ 5  Ankle dorsiflexion 0 5  Ankle plantarflexion 1 3+  Ankle inversion    Ankle eversion    (Blank rows = not tested)  BED MOBILITY:  NT  TRANSFERS: Assistive device utilized: Hemi walker  Sit to stand: Modified independence Stand to sit: SBA Chair to chair: SBA and CGA Floor:  Total assist  RAMP:  Level of Assistance: CGA Assistive device utilized: Hemi walker Ramp Comments:   CURB:  Level of Assistance: Min A Assistive device utilized: Nutritional therapist Comments: cues in sequence  STAIRS:  Level of Assistance: CGA and Min A  Stair Negotiation Technique: Step to Pattern with Single Rail on Left  Number of Stairs: 10   Height of Stairs: 4-6"  Comments: cues and  guarding  GAIT: Gait pattern: step to pattern, decreased ankle dorsiflexion- Right, and circumduction- Right Distance walked: 82 Assistive device utilized: Hemi walker Level of assistance: CGA Comments: deficits during turning  FUNCTIONAL TESTs:  5 times sit to stand: 11.59 sec with 3 retro LOB and poor eccentric control Timed up and go (TUG): 34.75 sec Berg Balance Scale: 32/56 Dynamic Gait Index: NT 2 minute walk test: 82 ft w/ hemi-walker, 0.68 ft/sec  M-CTSIB  Condition 1: Firm Surface, EO 30 Sec, Normal and Mild Sway  Condition 2: Firm Surface, EC 30 Sec, Mild and Moderate Sway  Condition 3: Foam Surface, EO 30 Sec, Mild Sway  Condition 4: Foam Surface, EC 26 Sec, Moderate and Severe Sway     PATIENT SURVEYS:  FOTO 47%  TODAY'S TREATMENT:  assessment   PATIENT EDUCATION: Education details: assessment findings, PT scope of practice Person educated: Patient and Spouse Education method: Explanation Education comprehension: verbalized understanding   HOME EXERCISE PROGRAM: TBD    GOALS: Goals reviewed with patient? Yes  SHORT TERM GOALS: Target date: 08/23/2022  Patient will perform HEP with family/caregiver supervision for improved strength, balance, transfers, and gait  Baseline: Goal status: GOAL MET, 08/23/2022  2.  Ambulate 12 stairs with left HR and supervision to improve safety within home Baseline: CGA-min A; per report CGA/supervision 6 steps Goal status: PARTIALLY MET, 08/23/2022  3.  Demonstrate improved safety and efficiency with ambulation per distance of 100 ft during 2MWT Baseline: 82 ft w/ HW and CGA; 111 ft 08/23/2022 Goal status: GOAL MET, 08/23/2022    LONG TERM GOALS: Target date: 09/20/2022  Demonstrate improved balance and reduced risk for falls per score 40/56 Berg Balance Test Baseline: 32/56 Goal status:IN PROGRESS  2.  Demonstrate modified independent ambulation  x 150 ft level surfaces using least restrictive AD in order to  improve safety with home environment Baseline: CGA w/ HW Goal status: IN PROGRESS  3.  Manifest improved safety with gait per time of 18 sec TUG test using least restrictive AD Baseline: 34.75 sec w/ HW Goal status: IN PROGRESS    ASSESSMENT:  CLINICAL IMPRESSION: Patient arrived to session without complaints. Worked on mat exercises to encourage use of hemiparetic side today. Patient was able to perform bed mobility on mat fairly well and with good awareness of hemiparetic arm. Did require min A to sit up from the R side. Required PT assist to take up weight of R LE in order to perform hip strengthening against gravity. Much improved ability to weight shift R today, and with improved carryover with gait. Patient reported no complaints at end of session.   OBJECTIVE IMPAIRMENTS Abnormal gait, decreased activity tolerance, decreased balance, decreased coordination, decreased knowledge of use of DME, decreased mobility, difficulty walking, decreased strength, impaired sensation, impaired tone, impaired UE functional use, and improper body mechanics.   ACTIVITY LIMITATIONS carrying, lifting, bending, standing, squatting, stairs, transfers, bathing, toileting, dressing, reach over head, and locomotion level  PARTICIPATION LIMITATIONS: meal prep, cleaning, laundry, interpersonal relationship, driving, shopping, community activity, occupation, yard work, and leisure activities  [place] Time since onset of injury/illness/exacerbation are also affecting patient's functional outcome.   REHAB POTENTIAL: Excellent  CLINICAL DECISION MAKING: Evolving/moderate complexity  EVALUATION COMPLEXITY: Moderate  PLAN: PT FREQUENCY: 2x/week  PT DURATION: 8 weeks  PLANNED INTERVENTIONS: Therapeutic exercises, Therapeutic activity, Neuromuscular re-education, Balance training, Gait training, Patient/Family education, Self Care, Joint mobilization, Stair training, Vestibular training, Canalith  repositioning, Orthotic/Fit training, DME instructions, Aquatic Therapy, Dry Needling, Electrical stimulation, Spinal mobilization, Cryotherapy, Moist heat, Splintting, Taping, and Manual therapy  PLAN FOR NEXT SESSION: LTGs due 09/20/22, so check LTGs and discuss POC.  (May need to add more appointments). Consider forearm walker? Continue gait training with AFO trial.  Continue with RLE step ups; use of mirror for R wt shift, RLE forced weightbearing, quadruped?   Janene Harvey, PT, DPT 09/15/22 1:21 PM  [facility] [address] [place], [place], [place] [address] Phone # [phone] Fax # [phone]

## 2022-09-14 NOTE — Therapy (Signed)
[facility] [address] [place], [place], [address] Phone: [phone]   Fax:  [phone]  Patient Details  Name: Gordon Fisher MRN: [id number] Date of Birth: 1961/07/16 Referring Provider:  Fanny Dance, MD  Encounter Date: 09/13/2022   ST arrive-cancel In discussing visit limits and timing of [place] arrival, pt, wife and SLP decided to cx today's session on 09-13-22 and use it when Lynder Parents is here.  Doyt Castellana, CCC-SLP 09/14/2022, 10:51 AM  [facility] [address], [address] [place], [place], [address] Phone: [phone]   Fax:  [phone]

## 2022-09-15 ENCOUNTER — Ambulatory Visit: Payer: 59

## 2022-09-15 ENCOUNTER — Ambulatory Visit: Payer: 59 | Admitting: Physical Therapy

## 2022-09-15 ENCOUNTER — Encounter: Payer: Self-pay | Admitting: Physical Therapy

## 2022-09-15 ENCOUNTER — Ambulatory Visit: Payer: 59 | Admitting: Occupational Therapy

## 2022-09-15 DIAGNOSIS — M6281 Muscle weakness (generalized): Secondary | ICD-10-CM

## 2022-09-15 DIAGNOSIS — R2689 Other abnormalities of gait and mobility: Secondary | ICD-10-CM

## 2022-09-15 DIAGNOSIS — R2681 Unsteadiness on feet: Secondary | ICD-10-CM

## 2022-09-15 DIAGNOSIS — I69351 Hemiplegia and hemiparesis following cerebral infarction affecting right dominant side: Secondary | ICD-10-CM | POA: Diagnosis not present

## 2022-09-15 DIAGNOSIS — R208 Other disturbances of skin sensation: Secondary | ICD-10-CM

## 2022-09-15 DIAGNOSIS — R278 Other lack of coordination: Secondary | ICD-10-CM

## 2022-09-15 NOTE — Therapy (Signed)
OUTPATIENT OCCUPATIONAL THERAPY NEURO TREATMENT NOTE  Patient Name: Gordon Fisher MRN: [id number] DOB:February 05, 1961, 61 y.o., male Today's Date: 09/15/2022  PCP: none on file REFERRING PROVIDER: Jennye Boroughs, MD    OT End of Session - 09/15/22 1513     Visit Number 13    Number of Visits 17    Date for OT Re-Evaluation 09/24/22    Authorization Type [employer]    Authorization Time Period VL: 60 combined (OT/PT/ST)    Authorization - Visit Number 13    Authorization - Number of Visits 20    OT Start Time 1607    OT Stop Time 1402    OT Time Calculation (min) 45 min    Activity Tolerance Patient tolerated treatment well    Behavior During Therapy WFL for tasks assessed/performed                  Past Medical History:  Diagnosis Date   Stroke [facility])    Past Surgical History:  Procedure Laterality Date   BUBBLE STUDY  02/15/2022   Procedure: BUBBLE STUDY;  Surgeon: Werner Lean, MD;  Location: [place];  Service: Cardiovascular;;   IR CT HEAD LTD  02/11/2022   IR CT HEAD LTD  02/11/2022   IR CT HEAD LTD  02/11/2022   IR PERCUTANEOUS ART THROMBECTOMY/INFUSION INTRACRANIAL INC DIAG ANGIO  02/11/2022   IR PERCUTANEOUS ART THROMBECTOMY/INFUSION INTRACRANIAL INC DIAG ANGIO  02/11/2022   IR US GUIDE VASC ACCESS RIGHT  02/11/2022   IR US GUIDE VASC ACCESS RIGHT  02/11/2022   RADIOLOGY WITH ANESTHESIA N/A 02/11/2022   Procedure: IR WITH ANESTHESIA;  Surgeon: Radiologist, Medication, MD;  Location: [place];  Service: Radiology;  Laterality: N/A;   RADIOLOGY WITH ANESTHESIA N/A 02/11/2022   Procedure: RADIOLOGY WITH ANESTHESIA;  Surgeon: Radiologist, Medication, MD;  Location: [place];  Service: Radiology;  Laterality: N/A;   TEE WITHOUT CARDIOVERSION N/A 02/15/2022   Procedure: TRANSESOPHAGEAL ECHOCARDIOGRAM (TEE);  Surgeon: Werner Lean, MD;  Location: [facility] ENDOSCOPY;  Service: Cardiovascular;  Laterality: N/A;   Patient Active Problem List    Diagnosis Date Noted   Hypocalcemia 03/22/2022   DVT, lower extremity, distal ([place]) 03/22/2022   Obesity 03/22/2022   Elevated LDL cholesterol level 03/22/2022   Acute ischemic left middle cerebral artery (MCA) stroke ([place]) 02/18/2022   Cerebrovascular accident (CVA) due to occlusion of left middle cerebral artery ([place]) 02/11/2022    ONSET DATE: referral 07/15/22 (CVA 02/11/22)  REFERRING DIAG: I63.9 (ICD-10-CM) - Cerebrovascular accident (CVA), unspecified mechanism ([place]) R13.10 (ICD-10-CM) - Dysphagia, unspecified type   THERAPY DIAG:  Hemiplegia and hemiparesis following cerebral infarction affecting right dominant side (HCC)  Other lack of coordination  Other disturbances of skin sensation  Muscle weakness (generalized)  Unsteadiness on feet  Rationale for Evaluation and Treatment Rehabilitation  SUBJECTIVE:   SUBJECTIVE STATEMENT: Pt's wife's sister present and reporting that she assists him with his exercises frequently. Pt accompanied by: self and spouse, spouse's sister  PERTINENT HISTORY: Ischemic L MCA CVA w/ residual R-sided hemiparesis 02/11/22; in relatively good health prior to onset  PRECAUTIONS: Fall  PAIN: Are you having pain? No  FALLS: Has patient fallen in last 6 months? Yes. Number of falls 1  PLOF: Independent, Independent with basic ADLs, and Independent with gait  PATIENT GOALS: continue to make gains, independence with getting dressed   OBJECTIVE:   HAND DOMINANCE: Right  FUNCTIONAL OUTCOME MEASURES: FOTO: 11  UPPER EXTREMITY ROM    Active ROM:  Pt with no AROM during evaluation.  Pt was able to elicit min shoulder elevation and slight gross grasp when cued and provided increased time to initiate movement.    Passive ROM Right Eval - 9/25  Shoulder flexion 100  Shoulder abduction   Shoulder adduction   Shoulder extension   Shoulder internal rotation WNL  Shoulder external rotation onset of pain >5-10* from neutral  Middle  trapezius   Lower trapezius   Elbow flexion WNL  Elbow extension WNL  Wrist flexion WNL  Wrist extension WNL  Wrist ulnar deviation   Wrist radial deviation   Wrist pronation   Wrist supination   (Blank rows = not tested)  HAND FUNCTION: No functional grasp. Trace gross grasp with increased time for initiation  SENSATION: Difficult to assess due to expressive aphasia  MUSCLE TONE: RUE: Mild and Hypertonic  ------------------------------------------------------------------------------------------------------------------------------------------------------------ (objective measures above completed at initial evaluation unless otherwise dated)  TODAY'S TREATMENT:  09/15/22 Self-ROM: engaged in scapular retraction, shoulder shrugs, shoulder girdle upward rotation, shoulder girdle abduction/adduction, and elbow flexion/extension.  OT positioned pt in front of mirror for visual feedback and providing demonstration, facilitation, and tactile cues for improved initiation of each activity.  OT providing facilitation at shoulder girdle to further facilitate elevation and retraction.  OT providing verbal and written instructions to pt, pt's spouse, and spouse's sister to increase carryover of positioning and technique.  Pt requiring hand over hand assist for improved L hand placement to facilitate improved ROM/support of RUE.  OT educated spouse and spouse's sister on proper hand placement and cues to provide to facilitate increased technique and functional carryover. NMES: discussed possibility of use of e-stim for NMR.  Pt's family expressing desire to pursue e-stim and agreeable to OT reaching out to PM&R and cardiologist for clearance.    09/13/22 Trunk control: Engaged in lateral scoots towards R with focus on trunk control and WB through RUE during transitional movements.  OT providing hand over hand for increased WB and positioning of RUE and verbal cues to facilitate increased activation  of hip movement to move RLE instead of picking up to advance. NMR exercises in supine for increased scapular support and gravity-minimized/eliminated positioning.  OT facilitating shoulder and elbow flexion/extension in supine position with tactile cues and repetition for increased carryover.  OT initiating each movement with PROM, followed by  OT providing tapping at biceps to facilitate elbow flexion, with pt then attempting AAROM.  Pt able to replicate elbow flexion/extension with only hand support to minimize effects of gravity.  Pt able to elicit trace shoulder flexion/extension, requiring increased facilitation with minimal carryover.  Pt continues to demonstrate internal rotation when attempting to activate shoulder flexion/extension. Shoulder flexion on hemi-walker on 2 legs: OT initially facilitating by placing hand over hand to maintain sustained grasp, transitioning to providing support at R elbow to facilitate approximation of R shoulder joint.  Pt able to complete flexion/extension with hemi-walker with increased effort.      09/08/22 Sidelying Exercises: Attempted towel slides for R shoulder flexion/extension in sidelying position with UE on tabletop. OT providing consistent tactile and verbal cues to decrease compensatory trunk movements (as pt eliciting internal rotation when attempting to activate shoulder flexion); intermittent physical assist for AAROM. Pt able to achieve poor activation with ext > flex.  OT facilitated elbow flexion/extension in sidelying position with UE on tabletop.  Pt demonstrating improved elbow flexion/extension compared to shoulder, however still benefiting from tactile and verbal cues to minimize compensatory trunk movements. Towel  slides for R shoulder ER while sitting EOM, focusing on AAROM and gentle PROM with OT providing education on benefit of ER for scapular stability. Also facilitated supination/pronation and wrist flexion/extension. Pt able to achieve poor  supination after PROM and use of compensatory trunk movements.  Provided with HEP with pictures to further facilitate carryover of self-ROM with supination/pronation and wrist flexion/extension.  Pt requiring hand over hand for correct LUE placement, wife able to provide cues as needed.      PATIENT EDUCATION: Ongoing condition-specific education related to therapeutic interventions completed this session, Self-ROM HEP  Person educated: Patient and Spouse Education method: Explanation, Demonstration, Tactile cues, Verbal cues, and Handouts Education comprehension: verbalized understanding and needs further education   HOME EXERCISE PROGRAM: Written exercises of shoulder and elbow in supine with cues for hand placement and facilitation.   Self-ROM handout from [facility] lab  [place] Access Code: [email or web address] URL: [email or web address] - Supine Shoulder Flexion AAROM with Hands Clasped  - 1 x daily - 1 sets - 10 reps - Seated Elbow Extension and Shoulder External Rotation AAROM at Table with Towel  - 1 x daily - 1 sets - 10 reps - Finger Extension with Wrist Extension Caregiver PROM  - 1 x daily - 1 sets - 10 reps  GOALS: Goals reviewed with patient? Yes  SHORT TERM GOALS: Target date: 08/27/22  Pt and spouse will be independent with PROM and self-ROM HEP for shoulder stability and ROM. Baseline: Goal status: MET - 08/30/22  2.  Pt will verbalize understanding of task modifications and/or potential A/E needs to increase ease, safety, and independence w/ ADLs Baseline:  Goal status: MET -08/30/22  3.  Pt will verbalize understanding of strategies and use of RUE as stabilizer during ADLs. Baseline:  Goal status: MET - 08/30/22   LONG TERM GOALS: Target date: 09/24/22  Pt will demonstrate ability to complete UB dressing with supervision with use of hemi-dressing technique. Baseline:  Goal status: IN PROGRESS  2.  Pt will demonstrate improved awareness of and  positioning of RUE during mobility and self-care tasks. Baseline:  Goal status: IN PROGRESS  3.  Pt will utilize RUE as gross assist during grooming tasks (such as washing hands and applying toothpaste). Baseline:  Goal status: IN PROGRESS  4.  Pt will verbalize and or demonstrate increased functional use of BUE during LB dressing to complete at supervision level. Baseline:  Goal status: IN PROGRESS  5.  Pt will verbalize improved functional incorporation of RUE with ADLs by reporting improved FOTO score to >/= to 20. Baseline: 11 Goal status: IN PROGRESS   ASSESSMENT:  CLINICAL IMPRESSION: Session today with focus on continued NMR of RUE at mirror for visual feedback, as well as OT providing demonstration and facilitation of movements to increase technique and carryover.  OT providing demonstration and hand over hand with both pt and caregivers to facilitate increased movement pattern.  Continued difficulty with shoulder AROM; R shoulder subluxation likely contributing to limitations.  Pt would benefit from e-stim if cleared by cardiology and PM&R.  PERFORMANCE DEFICITS in functional skills including ADLs, IADLs, coordination, sensation, tone, ROM, strength, pain, flexibility, FMC, GMC, mobility, balance, body mechanics, vision, and UE functional use and psychosocial skills including environmental adaptation and routines and behaviors.   IMPAIRMENTS are limiting patient from ADLs and IADLs.   COMORBIDITIES may have co-morbidities  that affects occupational performance. Patient will benefit from skilled OT to address above impairments and improve overall function.   PLAN: OT FREQUENCY:  2x/week  OT DURATION: 8 weeks  PLANNED INTERVENTIONS: self care/ADL training, therapeutic exercise, therapeutic activity, neuromuscular re-education, manual therapy, passive range of motion, balance training, functional mobility training, splinting, electrical stimulation, compression bandaging, moist  heat, cryotherapy, patient/family education, visual/perceptual remediation/compensation, psychosocial skills training, energy conservation, coping strategies training, and DME and/or AE instructions  RECOMMENDED OTHER SERVICES: N/A  CONSULTED AND AGREED WITH PLAN OF CARE: Patient and family member/caregiver  PLAN FOR NEXT SESSION: Continue w/ scapular stability: shoulder shrugs, rolls, and scapular retraction; WB and dynamic standing, supine and sidelying NMR.  E-stim if/when cleared by MD.   Simonne Come, OTR/L 09/15/2022, 3:14 PM

## 2022-09-16 NOTE — Progress Notes (Signed)
[facility] Summary Report / Loop Recorder 

## 2022-09-20 ENCOUNTER — Ambulatory Visit: Payer: 59 | Admitting: Occupational Therapy

## 2022-09-20 ENCOUNTER — Ambulatory Visit: Payer: 59

## 2022-09-20 DIAGNOSIS — I69351 Hemiplegia and hemiparesis following cerebral infarction affecting right dominant side: Secondary | ICD-10-CM

## 2022-09-20 DIAGNOSIS — R2681 Unsteadiness on feet: Secondary | ICD-10-CM

## 2022-09-20 DIAGNOSIS — R278 Other lack of coordination: Secondary | ICD-10-CM

## 2022-09-20 DIAGNOSIS — R2689 Other abnormalities of gait and mobility: Secondary | ICD-10-CM

## 2022-09-20 DIAGNOSIS — M6281 Muscle weakness (generalized): Secondary | ICD-10-CM

## 2022-09-20 DIAGNOSIS — R262 Difficulty in walking, not elsewhere classified: Secondary | ICD-10-CM

## 2022-09-20 DIAGNOSIS — R208 Other disturbances of skin sensation: Secondary | ICD-10-CM

## 2022-09-20 NOTE — Therapy (Signed)
OUTPATIENT OCCUPATIONAL THERAPY NEURO TREATMENT NOTE  Patient Name: Gordon Fisher MRN: [id number] DOB:09-Sep-1961, 61 y.o., male Today's Date: 09/20/2022  PCP: none on file REFERRING PROVIDER: Jennye Boroughs, MD    OT End of Session - 09/20/22 1331     Visit Number 14    Number of Visits 17    Date for OT Re-Evaluation 09/24/22    Authorization Type [employer]    Authorization Time Period VL: 60 combined (OT/PT/ST)    Authorization - Visit Number 14    Authorization - Number of Visits 20    OT Start Time 1326   arrival time   OT Stop Time 1400    OT Time Calculation (min) 34 min    Activity Tolerance Patient tolerated treatment well    Behavior During Therapy WFL for tasks assessed/performed                   Past Medical History:  Diagnosis Date   Stroke [facility])    Past Surgical History:  Procedure Laterality Date   BUBBLE STUDY  02/15/2022   Procedure: BUBBLE STUDY;  Surgeon: Werner Lean, MD;  Location: [place];  Service: Cardiovascular;;   IR CT HEAD LTD  02/11/2022   IR CT HEAD LTD  02/11/2022   IR CT HEAD LTD  02/11/2022   IR PERCUTANEOUS ART THROMBECTOMY/INFUSION INTRACRANIAL INC DIAG ANGIO  02/11/2022   IR PERCUTANEOUS ART THROMBECTOMY/INFUSION INTRACRANIAL INC DIAG ANGIO  02/11/2022   IR US GUIDE VASC ACCESS RIGHT  02/11/2022   IR US GUIDE VASC ACCESS RIGHT  02/11/2022   RADIOLOGY WITH ANESTHESIA N/A 02/11/2022   Procedure: IR WITH ANESTHESIA;  Surgeon: Radiologist, Medication, MD;  Location: [place];  Service: Radiology;  Laterality: N/A;   RADIOLOGY WITH ANESTHESIA N/A 02/11/2022   Procedure: RADIOLOGY WITH ANESTHESIA;  Surgeon: Radiologist, Medication, MD;  Location: [place];  Service: Radiology;  Laterality: N/A;   TEE WITHOUT CARDIOVERSION N/A 02/15/2022   Procedure: TRANSESOPHAGEAL ECHOCARDIOGRAM (TEE);  Surgeon: Werner Lean, MD;  Location: [facility] ENDOSCOPY;  Service: Cardiovascular;  Laterality: N/A;   Patient Active  Problem List   Diagnosis Date Noted   Hypocalcemia 03/22/2022   DVT, lower extremity, distal ([place]) 03/22/2022   Obesity 03/22/2022   Elevated LDL cholesterol level 03/22/2022   Acute ischemic left middle cerebral artery (MCA) stroke ([place]) 02/18/2022   Cerebrovascular accident (CVA) due to occlusion of left middle cerebral artery ([place]) 02/11/2022    ONSET DATE: referral 07/15/22 (CVA 02/11/22)  REFERRING DIAG: I63.9 (ICD-10-CM) - Cerebrovascular accident (CVA), unspecified mechanism ([place]) R13.10 (ICD-10-CM) - Dysphagia, unspecified type   THERAPY DIAG:  Hemiplegia and hemiparesis following cerebral infarction affecting right dominant side (HCC)  Other lack of coordination  Other disturbances of skin sensation  Muscle weakness (generalized)  Unsteadiness on feet  Rationale for Evaluation and Treatment Rehabilitation  SUBJECTIVE:   SUBJECTIVE STATEMENT: Pt and spouse report they kept the grand kids overnight. 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 No  FALLS: Has patient fallen in last 6 months? Yes. Number of falls 1  PLOF: Independent, Independent with basic ADLs, and Independent with gait  PATIENT GOALS: continue to make gains, independence with getting dressed   OBJECTIVE:   HAND DOMINANCE: Right  FUNCTIONAL OUTCOME MEASURES: FOTO: 11  UPPER EXTREMITY ROM    Active ROM: Pt with  no AROM during evaluation.  Pt was able to elicit min shoulder elevation and slight gross grasp when cued and provided increased time to initiate movement.    Passive ROM Right Eval - 9/25  Shoulder flexion 100  Shoulder abduction   Shoulder adduction   Shoulder extension   Shoulder internal rotation WNL  Shoulder external rotation onset of pain >5-10* from neutral  Middle trapezius   Lower trapezius   Elbow  flexion WNL  Elbow extension WNL  Wrist flexion WNL  Wrist extension WNL  Wrist ulnar deviation   Wrist radial deviation   Wrist pronation   Wrist supination   (Blank rows = not tested)  HAND FUNCTION: No functional grasp. Trace gross grasp with increased time for initiation  SENSATION: Difficult to assess due to expressive aphasia  MUSCLE TONE: RUE: Mild and Hypertonic  ------------------------------------------------------------------------------------------------------------------------------------------------------------ (objective measures above completed at initial evaluation unless otherwise dated)  TODAY'S TREATMENT:  09/20/22 PROM: shoulder flexion/extension, abduction/adduction, and internal/external rotation with therapist providing support at elbow to minimize pain/effects d/t subluxation.  PROM of wrist flexion/extension and ulnar/radial deviation.  Pt reports pain x1 with external rotation, however no further complaints with any movements or repetition of external rotation.  WB: through full RUE in extension with hand placed over therapist's thigh to allow OT to further facilitate WB with direction changes.  Pt reaching across midline to reach for rings to further facilitate WB.  OT then repositioned pt's hand to flat on mat table with tactile cues/facilitation at elbow and shoulder for increased WB. Attempting shoulder activation post WB, however pt still with trace movement and requiring increased internal rotation and compensatory trunk movements to facilitate even the trace movement.   UE Ranger: forward shoulder flexion/extension and horizontal abduction/adduction with OT providing support at elbow to facilitate increased alignment and shoulder positioning due to subluxation.  Pt with increased difficulty with flexion, utilizing compensatory trunk movements to facilitate increased forward ROM.    09/15/22 Self-ROM: engaged in scapular retraction, shoulder shrugs,  shoulder girdle upward rotation, shoulder girdle abduction/adduction, and elbow flexion/extension.  OT positioned pt in front of mirror for visual feedback and providing demonstration, facilitation, and tactile cues for improved initiation of each activity.  OT providing facilitation at shoulder girdle to further facilitate elevation and retraction.  OT providing verbal and written instructions to pt, pt's spouse, and spouse's sister to increase carryover of positioning and technique.  Pt requiring hand over hand assist for improved L hand placement to facilitate improved ROM/support of RUE.  OT educated spouse and spouse's sister on proper hand placement and cues to provide to facilitate increased technique and functional carryover. NMES: discussed possibility of use of e-stim for NMR.  Pt's family expressing desire to pursue e-stim and agreeable to OT reaching out to PM&R and cardiologist for clearance.    09/13/22 Trunk control: Engaged in lateral scoots towards R with focus on trunk control and WB through RUE during transitional movements.  OT providing hand over hand for increased WB and positioning of RUE and verbal cues to facilitate increased activation of hip movement to move RLE instead of picking up to advance. NMR exercises in supine for increased scapular support and gravity-minimized/eliminated positioning.  OT facilitating shoulder and elbow flexion/extension in supine position with tactile cues and repetition for increased carryover.  OT initiating each movement with PROM, followed by  OT providing tapping at biceps to facilitate elbow flexion, with pt then attempting AAROM.  Pt able to replicate elbow flexion/extension with only  hand support to minimize effects of gravity.  Pt able to elicit trace shoulder flexion/extension, requiring increased facilitation with minimal carryover.  Pt continues to demonstrate internal rotation when attempting to activate shoulder flexion/extension. Shoulder  flexion on hemi-walker on 2 legs: OT initially facilitating by placing hand over hand to maintain sustained grasp, transitioning to providing support at R elbow to facilitate approximation of R shoulder joint.  Pt able to complete flexion/extension with hemi-walker with increased effort.       PATIENT EDUCATION: Ongoing condition-specific education related to therapeutic interventions completed this session, Self-ROM HEP  Person educated: Patient and Spouse Education method: Explanation, Demonstration, Tactile cues, Verbal cues, and Handouts Education comprehension: verbalized understanding and needs further education   HOME EXERCISE PROGRAM: Written exercises of shoulder and elbow in supine with cues for hand placement and facilitation.   Self-ROM handout from [facility] lab  [place] Access Code: [email or web address] URL: [email or web address] - Supine Shoulder Flexion AAROM with Hands Clasped  - 1 x daily - 1 sets - 10 reps - Seated Elbow Extension and Shoulder External Rotation AAROM at Table with Towel  - 1 x daily - 1 sets - 10 reps - Finger Extension with Wrist Extension Caregiver PROM  - 1 x daily - 1 sets - 10 reps  GOALS: Goals reviewed with patient? Yes  SHORT TERM GOALS: Target date: 08/27/22  Pt and spouse will be independent with PROM and self-ROM HEP for shoulder stability and ROM. Baseline: Goal status: MET - 08/30/22  2.  Pt will verbalize understanding of task modifications and/or potential A/E needs to increase ease, safety, and independence w/ ADLs Baseline:  Goal status: MET -08/30/22  3.  Pt will verbalize understanding of strategies and use of RUE as stabilizer during ADLs. Baseline:  Goal status: MET - 08/30/22   LONG TERM GOALS: Target date: 09/24/22  Pt will demonstrate ability to complete UB dressing with supervision with use of hemi-dressing technique. Baseline:  Goal status: IN PROGRESS  2.  Pt will demonstrate improved awareness of and  positioning of RUE during mobility and self-care tasks. Baseline:  Goal status: IN PROGRESS  3.  Pt will utilize RUE as gross assist during grooming tasks (such as washing hands and applying toothpaste). Baseline:  Goal status: IN PROGRESS  4.  Pt will verbalize and or demonstrate increased functional use of BUE during LB dressing to complete at supervision level. Baseline:  Goal status: IN PROGRESS  5.  Pt will verbalize improved functional incorporation of RUE with ADLs by reporting improved FOTO score to >/= to 20. Baseline: 11 Goal status: IN PROGRESS   ASSESSMENT:  CLINICAL IMPRESSION: Session today with focus on continued NMR of RUE with PROM and WB.  Pt continues to demonstrate difficulty with shoulder AROM, frequently demonstrating compensatory strategies of weight shifting, forward lean, and internal rotation.  R shoulder subluxation likely contributing to limitations.  Pt would benefit from e-stim if cleared by cardiology and/or PM&R.  PERFORMANCE DEFICITS in functional skills including ADLs, IADLs, coordination, sensation, tone, ROM, strength, pain, flexibility, FMC, GMC, mobility, balance, body mechanics, vision, and UE functional use and psychosocial skills including environmental adaptation and routines and behaviors.   IMPAIRMENTS are limiting patient from ADLs and IADLs.   COMORBIDITIES may have co-morbidities  that affects occupational performance. Patient will benefit from skilled OT to address above impairments and improve overall function.   PLAN: OT FREQUENCY: 2x/week  OT DURATION: 8 weeks  PLANNED INTERVENTIONS: self care/ADL training, therapeutic exercise, therapeutic activity, neuromuscular re-education,  manual therapy, passive range of motion, balance training, functional mobility training, splinting, electrical stimulation, compression bandaging, moist heat, cryotherapy, patient/family education, visual/perceptual remediation/compensation, psychosocial  skills training, energy conservation, coping strategies training, and DME and/or AE instructions  RECOMMENDED OTHER SERVICES: N/A  CONSULTED AND AGREED WITH PLAN OF CARE: Patient and family member/caregiver  PLAN FOR NEXT SESSION: Continue w/ scapular stability: shoulder shrugs, rolls, and scapular retraction; WB and dynamic standing, supine and sidelying NMR.  E-stim if/when cleared by MD.   Simonne Come, OTR/L 09/20/2022, 1:38 PM

## 2022-09-20 NOTE — Therapy (Signed)
OUTPATIENT PHYSICAL THERAPY NEURO TREATMENT and Recertification   Patient Name: Gordon Fisher MRN: [id number] DOB:Dec 07, 1960, 61 y.o., male Today's Date: 09/20/2022   PCP: None listed REFERRING PROVIDER: Jennye Boroughs, MD    PT End of Session - 09/20/22 1337     Visit Number 14    Number of Visits 40    Date for PT Re-Evaluation 12/13/22    Authorization Type [employer] [id number]    Authorization - Visit Number 52    Authorization - Number of Visits 20    Progress Note Due on Visit 10    PT Start Time 1400    PT Stop Time 1445    PT Time Calculation (min) 45 min    Equipment Utilized During Treatment Gait belt    Activity Tolerance Patient tolerated treatment well    Behavior During Therapy WFL for tasks assessed/performed                   Past Medical History:  Diagnosis Date   Stroke [facility])    Past Surgical History:  Procedure Laterality Date   BUBBLE STUDY  02/15/2022   Procedure: BUBBLE STUDY;  Surgeon: Werner Lean, MD;  Location: [place];  Service: Cardiovascular;;   IR CT HEAD LTD  02/11/2022   IR CT HEAD LTD  02/11/2022   IR CT HEAD LTD  02/11/2022   IR PERCUTANEOUS ART THROMBECTOMY/INFUSION INTRACRANIAL INC DIAG ANGIO  02/11/2022   IR PERCUTANEOUS ART THROMBECTOMY/INFUSION INTRACRANIAL INC DIAG ANGIO  02/11/2022   IR US GUIDE VASC ACCESS RIGHT  02/11/2022   IR US GUIDE VASC ACCESS RIGHT  02/11/2022   RADIOLOGY WITH ANESTHESIA N/A 02/11/2022   Procedure: IR WITH ANESTHESIA;  Surgeon: Radiologist, Medication, MD;  Location: [place];  Service: Radiology;  Laterality: N/A;   RADIOLOGY WITH ANESTHESIA N/A 02/11/2022   Procedure: RADIOLOGY WITH ANESTHESIA;  Surgeon: Radiologist, Medication, MD;  Location: [place];  Service: Radiology;  Laterality: N/A;   TEE WITHOUT CARDIOVERSION N/A 02/15/2022   Procedure: TRANSESOPHAGEAL ECHOCARDIOGRAM (TEE);  Surgeon: Werner Lean, MD;  Location: [facility];  Service: Cardiovascular;  Laterality: N/A;    Patient Active Problem List   Diagnosis Date Noted   Hypocalcemia 03/22/2022   DVT, lower extremity, distal ([place]) 03/22/2022   Obesity 03/22/2022   Elevated LDL cholesterol level 03/22/2022   Acute ischemic left middle cerebral artery (MCA) stroke ([place]) 02/18/2022   Cerebrovascular accident (CVA) due to occlusion of left middle cerebral artery ([place]) 02/11/2022    ONSET DATE: April 2023  REFERRING DIAG: I63.9 (ICD-10-CM) - Cerebrovascular accident (CVA), unspecified mechanism ([place]) R13.10 (ICD-10-CM) - Dysphagia, unspecified type   THERAPY DIAG:  Hemiplegia and hemiparesis following cerebral infarction affecting right dominant side (HCC)  Muscle weakness (generalized)  Unsteadiness on feet  Other abnormalities of gait and mobility  Difficulty in walking, not elsewhere classified  Rationale for Evaluation and Treatment Rehabilitation  SUBJECTIVE:  SUBJECTIVE STATEMENT: Getting AFO tomorrow Pt accompanied by: significant other  PERTINENT HISTORY: 02/11/2022 patient developed right-sided weakness, aphasia, went to hospital for evaluation.  Code stroke was activated.  He received TNK and then transferred to [facility].  He underwent cerebral angiogram with thrombectomy.  Unfortunately vessel reoccluded later the day and the second thrombectomy was attempted.  Left M2 dissection was identified and treated with stent but unfortunately stent demonstrated reocclusion   PAIN:  Are you having pain? No  PRECAUTIONS: Shoulder and Fall, Aphasia   WEIGHT BEARING RESTRICTIONS No  FALLS: Has patient fallen in last 6 months? Yes. Number of falls 1, required 3-assist to recover  LIVING ENVIRONMENT: Lives with: lives with their spouse Lives in: House/apartment Stairs: Yes: Internal: flight  steps; bilateral but cannot reach both and External: 4-5 steps; ramp is also present.  Has following equipment at home: Hemi walker, Wheelchair (manual), and sit-stand lift  PLOF: Independent  PATIENT GOALS regain independence  OBJECTIVE:    TODAY'S TREATMENT: 09/20/22 Activity Comments  Berg Balance Test 42/56  Gait training -stair ambulation x 20 steps w/ left HR and SBA -gait w/ platform RW: 10 MWT 23.71 and 20.55  TUG test 30.69 sec w/ HW and right AFO  Gait speed  1.59 ft/sec w/ platform RW and AFO. Initiated with therapist guiding walker and tapered feedback to promote patient carryover with good effects           TODAY'S TREATMENT: 09/15/22 Activity Comments  Bridge 2x10 Adjusted R foot back dfor increased Wbing; R hip instability ; added ball between knees to control R hip ER  supine heel slides with pball 15x Initially with strap, then without, then added manual resistance; focused on keeping hip neutral   SL R hip ABD 2x10 PT assist to maintain proper alignment and avoid hip flexion   SL R hip ADD 2x10 PT manual assist; opposite LE resting on bolster   Sitting ball squeeze 2x5 3" Cueing for increased R LE participation   standing R wt shifts to touch R hip to cone, then added L foot step Much improved ability  Gait training with hemi walker x65f Cueing to shift hips R, then step L               HOME EXERCISE PROGRAM Last updated: 09/15/22 Access Code: [email or web address]: [email or web address] Date: 09/15/2022 [facility]  Exercises - Forward Backward Weight Shift with Counter Support  - 1 x daily - 5 x weekly - 2 sets - 10 reps - Side to Side Weight Shift with Counter Support  - 1 x daily - 5 x weekly - 2 sets - 10 reps - Mini Squat with Counter Support  - 1 x daily - 5 x weekly - 2 sets - 10 reps - Stride Stance Weight Shift  - 1 x daily - 5 x weekly - 2 sets - 10 reps - Sit to stand in stride  stance  - 2 x daily - 7 x weekly - 1-2 sets - 5 reps - Supine Bridge with Mini Swiss Ball Between Knees  - 1 x daily - 5 x weekly - 2 sets - 10 reps - Supine Heel Slide  - 1 x daily - 5 x weekly - 2 sets - 10 reps - Seated Isometric Hip Adduction with Ball  - 1 x daily - 5 x weekly - 2 sets - 10 reps - 3 sec hold  PATIENT  EDUCATION: Education details: HEP update Person educated: Patient and Spouse Education method: Explanation, Demonstration, Tactile cues, Verbal cues, and Handouts Education comprehension: verbalized understanding and returned demonstration    Below measures were taken at time of initial evaluation unless otherwise specified:     DIAGNOSTIC FINDINGS: Follow-up MRI brain done revealing small areas of acute infarct in left-MCA territory with mild hemorrhagic transformation in left parietal lobe and left-MCA bifurcation residual vascular thrombus   COGNITION: Overall cognitive status:  Difficult to assess due to aphasia   SENSATION: Light touch: Impaired  Proprioception: Impaired   COORDINATION: RLE impaired, some deficits on left foot with alternating movements and isolated movements  EDEMA:  None   MUSCLE TONE: RLE: Modifed Ashworth Scale 2 = More marked increase in muscle tone through most of the ROM, but affected part(s) easily moved right quad most affected, no clonus right plantarflexors   MUSCLE LENGTH: Able to achieve full right knee extension to PROM  DTRs:  Patella 3+ = Brisk  POSTURE: No Significant postural limitations  LOWER EXTREMITY ROM:     LLE WNL, RLE AROM limited by weakness  LOWER EXTREMITY MMT:    MMT Right Eval Left Eval  Hip flexion 3- 5  Hip extension    Hip abduction 3- 4  Hip adduction 3- 5  Hip internal rotation    Hip external rotation    Knee flexion 3 5  Knee extension 2+ 5  Ankle dorsiflexion 0 5  Ankle plantarflexion 1 3+  Ankle inversion    Ankle eversion    (Blank rows = not tested)  BED MOBILITY:   NT  TRANSFERS: Assistive device utilized: Hemi walker  Sit to stand: Modified independence Stand to sit: SBA Chair to chair: SBA and CGA Floor:  Total assist  RAMP:  Level of Assistance: CGA Assistive device utilized: Hemi walker Ramp Comments:   CURB:  Level of Assistance: Min A Assistive device utilized: Nutritional therapist Comments: cues in sequence  STAIRS:  Level of Assistance: CGA and Min A  Stair Negotiation Technique: Step to Pattern with Single Rail on Left  Number of Stairs: 10   Height of Stairs: 4-6"  Comments: cues and guarding  GAIT: Gait pattern: step to pattern, decreased ankle dorsiflexion- Right, and circumduction- Right Distance walked: 82 Assistive device utilized: Hemi walker Level of assistance: CGA Comments: deficits during turning  FUNCTIONAL TESTs:  5 times sit to stand: 11.59 sec with 3 retro LOB and poor eccentric control Timed up and go (TUG): 34.75 sec Berg Balance Scale: 32/56 Dynamic Gait Index: NT 2 minute walk test: 82 ft w/ hemi-walker, 0.68 ft/sec  M-CTSIB  Condition 1: Firm Surface, EO 30 Sec, Normal and Mild Sway  Condition 2: Firm Surface, EC 30 Sec, Mild and Moderate Sway  Condition 3: Foam Surface, EO 30 Sec, Mild Sway  Condition 4: Foam Surface, EC 26 Sec, Moderate and Severe Sway     PATIENT SURVEYS:  FOTO 47%  TODAY'S TREATMENT:  assessment   PATIENT EDUCATION: Education details: assessment findings, PT scope of practice Person educated: Patient and Spouse Education method: Explanation Education comprehension: verbalized understanding   HOME EXERCISE PROGRAM: TBD    GOALS: Goals reviewed with patient? Yes  SHORT TERM GOALS: Target date: 11/01/2022    Patient will perform HEP with family/caregiver supervision for improved strength, balance, transfers, and gait  Baseline: Goal status: GOAL MET, 08/23/2022  2.  Ambulate 12 stairs with left HR and supervision to improve safety within home Baseline:  CGA-min A; per report CGA/supervision 6 steps; able to perform Goal status: MET  3.  Demonstrate improved safety and efficiency with ambulation per distance of 100 ft during 2MWT Baseline: 82 ft w/ HW and CGA; 111 ft 08/23/2022 Goal status: GOAL MET, 08/23/2022  4. Demonstrate improved gait speed to 1.8 ft/sec with right AFO and least restrictive AD to improve efficiency of gait  Baseline: 0.68 ft/sec at eval; (09/20/22) 1.59 ft/sec w/ platform RW and AFO  Goal status: On-going    LONG TERM GOALS: Target date: 12/13/2022    Demonstrate improved balance and reduced risk for falls per score 45/56 Berg Balance Test Baseline: 32/56; (09/20/22) 42/56 Goal status:Goal revised  2.  Demonstrate modified independent ambulation x 150 ft level surfaces using least restrictive AD in order to improve safety with home environment Baseline: CGA w/ HW; (09/20/22) supervision with HW level surfaces Goal status: IN PROGRESS  3.  Manifest improved safety with gait per time of 18 sec TUG test using least restrictive AD Baseline: 34.75 sec w/ HW; (09/20/22) 30.69 w/ HW and right AFO Goal status: IN PROGRESS   4. Stair ambulation x 15 steps with set-up assist to improve safety in home environment and reduce burden of care  Baseline: SBA with single HR, using AFO  Goal status: On-going      ASSESSMENT:  CLINICAL IMPRESSION: Progressing quite well with POC details and demonstrating improved functional mobility and progressing with reducing risk for falls as evidenced by improved score of 42/56 Berg Balance Test (from initial score of 32/56). Gait performance is steadily improving with greater independence and safety with ambulation on level surfaces with most optimal set-up using right AFO and hemi-walker at present but is trialing different devices in therapy sessions to both reduce the level of assist and also with therapist-assist in techniques to facilitate continuous steps and step length.   Continued sessions indicated to meet STG/LTG and facilitate greatest recovery and progress to modified independent or independence in functional mobility with low risk for falls.  OBJECTIVE IMPAIRMENTS Abnormal gait, decreased activity tolerance, decreased balance, decreased coordination, decreased knowledge of use of DME, decreased mobility, difficulty walking, decreased strength, impaired sensation, impaired tone, impaired UE functional use, and improper body mechanics.   ACTIVITY LIMITATIONS carrying, lifting, bending, standing, squatting, stairs, transfers, bathing, toileting, dressing, reach over head, and locomotion level  PARTICIPATION LIMITATIONS: meal prep, cleaning, laundry, interpersonal relationship, driving, shopping, community activity, occupation, yard work, and leisure activities  [place] Time since onset of injury/illness/exacerbation are also affecting patient's functional outcome.   REHAB POTENTIAL: Excellent  CLINICAL DECISION MAKING: Evolving/moderate complexity  EVALUATION COMPLEXITY: Moderate  PLAN: PT FREQUENCY: 1-2/wk  PT DURATION: 12 weeks  PLANNED INTERVENTIONS: Therapeutic exercises, Therapeutic activity, Neuromuscular re-education, Balance training, Gait training, Patient/Family education, Self Care, Joint mobilization, Stair training, Vestibular training, Canalith repositioning, Orthotic/Fit training, DME instructions, Aquatic Therapy, Dry Needling, Electrical stimulation, Spinal mobilization, Cryotherapy, Moist heat, Splintting, Taping, and Manual therapy  PLAN FOR NEXT SESSION: assist with platform RW to promote fast, continuous steps   3:27 PM, 09/20/22 M. Sherlyn Lees, PT, DPT Physical Therapist- [place] Office Number: [phone]   [place] [facility] [address] [place],  [address] Phone # [phone] Fax # [phone]

## 2022-09-22 ENCOUNTER — Ambulatory Visit: Payer: 59

## 2022-09-22 ENCOUNTER — Ambulatory Visit: Payer: 59 | Admitting: Occupational Therapy

## 2022-09-22 DIAGNOSIS — R2689 Other abnormalities of gait and mobility: Secondary | ICD-10-CM

## 2022-09-22 DIAGNOSIS — M6281 Muscle weakness (generalized): Secondary | ICD-10-CM

## 2022-09-22 DIAGNOSIS — R262 Difficulty in walking, not elsewhere classified: Secondary | ICD-10-CM

## 2022-09-22 DIAGNOSIS — I69351 Hemiplegia and hemiparesis following cerebral infarction affecting right dominant side: Secondary | ICD-10-CM | POA: Diagnosis not present

## 2022-09-22 DIAGNOSIS — R278 Other lack of coordination: Secondary | ICD-10-CM

## 2022-09-22 DIAGNOSIS — R208 Other disturbances of skin sensation: Secondary | ICD-10-CM

## 2022-09-22 DIAGNOSIS — R2681 Unsteadiness on feet: Secondary | ICD-10-CM

## 2022-09-22 NOTE — Therapy (Signed)
OUTPATIENT PHYSICAL THERAPY NEURO TREATMENT   Patient Name: Gordon Fisher MRN: [id number] DOB:1961/06/18, 61 y.o., male Today's Date: 09/22/2022   PCP: None listed REFERRING PROVIDER: Jennye Boroughs, MD    PT End of Session - 09/22/22 1322     Visit Number 15    Number of Visits 40    Date for PT Re-Evaluation 12/13/22    Authorization Type [employer] [id number]    Authorization - Visit Number 15    Authorization - Number of Visits 20    Progress Note Due on Visit 10    PT Start Time 1321    PT Stop Time 1400    PT Time Calculation (min) 39 min    Equipment Utilized During Treatment Gait belt    Activity Tolerance Patient tolerated treatment well    Behavior During Therapy WFL for tasks assessed/performed                   Past Medical History:  Diagnosis Date   Stroke [facility])    Past Surgical History:  Procedure Laterality Date   BUBBLE STUDY  02/15/2022   Procedure: BUBBLE STUDY;  Surgeon: Werner Lean, MD;  Location: [place];  Service: Cardiovascular;;   IR CT HEAD LTD  02/11/2022   IR CT HEAD LTD  02/11/2022   IR CT HEAD LTD  02/11/2022   IR PERCUTANEOUS ART THROMBECTOMY/INFUSION INTRACRANIAL INC DIAG ANGIO  02/11/2022   IR PERCUTANEOUS ART THROMBECTOMY/INFUSION INTRACRANIAL INC DIAG ANGIO  02/11/2022   IR US GUIDE VASC ACCESS RIGHT  02/11/2022   IR US GUIDE VASC ACCESS RIGHT  02/11/2022   RADIOLOGY WITH ANESTHESIA N/A 02/11/2022   Procedure: IR WITH ANESTHESIA;  Surgeon: Radiologist, Medication, MD;  Location: [place];  Service: Radiology;  Laterality: N/A;   RADIOLOGY WITH ANESTHESIA N/A 02/11/2022   Procedure: RADIOLOGY WITH ANESTHESIA;  Surgeon: Radiologist, Medication, MD;  Location: [place];  Service: Radiology;  Laterality: N/A;   TEE WITHOUT CARDIOVERSION N/A 02/15/2022   Procedure: TRANSESOPHAGEAL ECHOCARDIOGRAM (TEE);  Surgeon: Werner Lean, MD;  Location: [facility] ENDOSCOPY;  Service: Cardiovascular;  Laterality: N/A;   Patient Active Problem  List   Diagnosis Date Noted   Hypocalcemia 03/22/2022   DVT, lower extremity, distal ([place]) 03/22/2022   Obesity 03/22/2022   Elevated LDL cholesterol level 03/22/2022   Acute ischemic left middle cerebral artery (MCA) stroke ([place]) 02/18/2022   Cerebrovascular accident (CVA) due to occlusion of left middle cerebral artery ([place]) 02/11/2022    ONSET DATE: April 2023  REFERRING DIAG: I63.9 (ICD-10-CM) - Cerebrovascular accident (CVA), unspecified mechanism ([place]) R13.10 (ICD-10-CM) - Dysphagia, unspecified type   THERAPY DIAG:  Hemiplegia and hemiparesis following cerebral infarction affecting right dominant side (HCC)  Muscle weakness (generalized)  Unsteadiness on feet  Other abnormalities of gait and mobility  Difficulty in walking, not elsewhere classified  Rationale for Evaluation and Treatment Rehabilitation  SUBJECTIVE:  SUBJECTIVE STATEMENT: Received AFO yesterday  Pt accompanied by: significant other  PERTINENT HISTORY: 02/11/2022 patient developed right-sided weakness, aphasia, went to hospital for evaluation.  Code stroke was activated.  He received TNK and then transferred to [facility].  He underwent cerebral angiogram with thrombectomy.  Unfortunately vessel reoccluded later the day and the second thrombectomy was attempted.  Left M2 dissection was identified and treated with stent but unfortunately stent demonstrated reocclusion   PAIN:  Are you having pain? No  PRECAUTIONS: Shoulder and Fall, Aphasia   WEIGHT BEARING RESTRICTIONS No  FALLS: Has patient fallen in last 6 months? Yes. Number of falls 1, required 3-assist to recover  LIVING ENVIRONMENT: Lives with: lives with their spouse Lives in: House/apartment Stairs: Yes: Internal: flight steps; bilateral but  cannot reach both and External: 4-5 steps; ramp is also present.  Has following equipment at home: Hemi walker, Wheelchair (manual), and sit-stand lift  PLOF: Independent  PATIENT GOALS regain independence  OBJECTIVE:    TODAY'S TREATMENT: 09/22/22 Activity Comments  Gait training -platform RW with min A to enforce continuous steps 4x 50 ft w/ onset of fatigue and proximal instability -Wide-based straight cane: tactile cues for right lateral loading, tactile cues to right quad at midstance to promote quad contraction for LLE advancement -pushing w/c with person for resistance--worse compensatory movements RLE -retrowalking 2x40 ft with facilitation of RLE knee extension and verbal cues for LLE hip extension/step length -stair training: 4-6" step height facilitation of RLE to promote reciprocal pattern                     TODAY'S TREATMENT: 09/20/22 Activity Comments  Berg Balance Test 42/56  Gait training -stair ambulation x 20 steps w/ left HR and SBA -gait w/ platform RW: 10 MWT 23.71 and 20.55  TUG test 30.69 sec w/ HW and right AFO  Gait speed  1.59 ft/sec w/ platform RW and AFO. Initiated with therapist guiding walker and tapered feedback to promote patient carryover with good effects            HOME EXERCISE PROGRAM Last updated: 09/15/22 Access Code: [email or web address] URL: [email or web address] Date: 09/15/2022 Prepared by: [facility]  Exercises - Forward Backward Weight Shift with Counter Support  - 1 x daily - 5 x weekly - 2 sets - 10 reps - Side to Side Weight Shift with Counter Support  - 1 x daily - 5 x weekly - 2 sets - 10 reps - Mini Squat with Counter Support  - 1 x daily - 5 x weekly - 2 sets - 10 reps - Stride Stance Weight Shift  - 1 x daily - 5 x weekly - 2 sets - 10 reps - Sit to stand in stride stance  - 2 x daily - 7 x weekly - 1-2 sets - 5 reps - Supine Bridge with Mini Swiss Ball Between Knees  - 1 x  daily - 5 x weekly - 2 sets - 10 reps - Supine Heel Slide  - 1 x daily - 5 x weekly - 2 sets - 10 reps - Seated Isometric Hip Adduction with Ball  - 1 x daily - 5 x weekly - 2 sets - 10 reps - 3 sec hold  PATIENT EDUCATION: Education details: HEP update Person educated: Patient and Spouse Education method: Explanation, Demonstration, Tactile cues, Verbal cues, and Handouts Education comprehension: verbalized understanding and returned demonstration    Below measures were  taken at time of initial evaluation unless otherwise specified:     DIAGNOSTIC FINDINGS: Follow-up MRI brain done revealing small areas of acute infarct in left-MCA territory with mild hemorrhagic transformation in left parietal lobe and left-MCA bifurcation residual vascular thrombus   COGNITION: Overall cognitive status:  Difficult to assess due to aphasia   SENSATION: Light touch: Impaired  Proprioception: Impaired   COORDINATION: RLE impaired, some deficits on left foot with alternating movements and isolated movements  EDEMA:  None   MUSCLE TONE: RLE: Modifed Ashworth Scale 2 = More marked increase in muscle tone through most of the ROM, but affected part(s) easily moved right quad most affected, no clonus right plantarflexors   MUSCLE LENGTH: Able to achieve full right knee extension to PROM  DTRs:  Patella 3+ = Brisk  POSTURE: No Significant postural limitations  LOWER EXTREMITY ROM:     LLE WNL, RLE AROM limited by weakness  LOWER EXTREMITY MMT:    MMT Right Eval Left Eval  Hip flexion 3- 5  Hip extension    Hip abduction 3- 4  Hip adduction 3- 5  Hip internal rotation    Hip external rotation    Knee flexion 3 5  Knee extension 2+ 5  Ankle dorsiflexion 0 5  Ankle plantarflexion 1 3+  Ankle inversion    Ankle eversion    (Blank rows = not tested)  BED MOBILITY:  NT  TRANSFERS: Assistive device utilized: Hemi walker  Sit to stand: Modified independence Stand to sit:  SBA Chair to chair: SBA and CGA Floor:  Total assist  RAMP:  Level of Assistance: CGA Assistive device utilized: Hemi walker Ramp Comments:   CURB:  Level of Assistance: Min A Assistive device utilized: Nutritional therapist Comments: cues in sequence  STAIRS:  Level of Assistance: CGA and Min A  Stair Negotiation Technique: Step to Pattern with Single Rail on Left  Number of Stairs: 10   Height of Stairs: 4-6"  Comments: cues and guarding  GAIT: Gait pattern: step to pattern, decreased ankle dorsiflexion- Right, and circumduction- Right Distance walked: 82 Assistive device utilized: Hemi walker Level of assistance: CGA Comments: deficits during turning  FUNCTIONAL TESTs:  5 times sit to stand: 11.59 sec with 3 retro LOB and poor eccentric control Timed up and go (TUG): 34.75 sec Berg Balance Scale: 32/56 Dynamic Gait Index: NT 2 minute walk test: 82 ft w/ hemi-walker, 0.68 ft/sec  M-CTSIB  Condition 1: Firm Surface, EO 30 Sec, Normal and Mild Sway  Condition 2: Firm Surface, EC 30 Sec, Mild and Moderate Sway  Condition 3: Foam Surface, EO 30 Sec, Mild Sway  Condition 4: Foam Surface, EC 26 Sec, Moderate and Severe Sway     PATIENT SURVEYS:  FOTO 47%  TODAY'S TREATMENT:  assessment   PATIENT EDUCATION: Education details: assessment findings, PT scope of practice Person educated: Patient and Spouse Education method: Explanation Education comprehension: verbalized understanding   HOME EXERCISE PROGRAM: TBD    GOALS: Goals reviewed with patient? Yes  SHORT TERM GOALS: Target date: 11/01/2022    Patient will perform HEP with family/caregiver supervision for improved strength, balance, transfers, and gait  Baseline: Goal status: GOAL MET, 08/23/2022  2.  Ambulate 12 stairs with left HR and supervision to improve safety within home Baseline: CGA-min A; per report CGA/supervision 6 steps; able to perform Goal status: MET  3.  Demonstrate improved safety  and efficiency with ambulation per distance of 100 ft during 2MWT Baseline: 82 ft  w/ HW and CGA; 111 ft 08/23/2022 Goal status: GOAL MET, 08/23/2022  4. Demonstrate improved gait speed to 1.8 ft/sec with right AFO and least restrictive AD to improve efficiency of gait  Baseline: 0.68 ft/sec at eval; (09/20/22) 1.59 ft/sec w/ platform RW and AFO  Goal status: On-going    LONG TERM GOALS: Target date: 12/13/2022    Demonstrate improved balance and reduced risk for falls per score 45/56 Berg Balance Test Baseline: 32/56; (09/20/22) 42/56 Goal status:Goal revised  2.  Demonstrate modified independent ambulation x 150 ft level surfaces using least restrictive AD in order to improve safety with home environment Baseline: CGA w/ HW; (09/20/22) supervision with HW level surfaces Goal status: IN PROGRESS  3.  Manifest improved safety with gait per time of 18 sec TUG test using least restrictive AD Baseline: 34.75 sec w/ HW; (09/20/22) 30.69 w/ HW and right AFO Goal status: IN PROGRESS   4. Stair ambulation x 15 steps with set-up assist to improve safety in home environment and reduce burden of care  Baseline: SBA with single HR, using AFO  Goal status: On-going      ASSESSMENT:  CLINICAL IMPRESSION:  Emphasis on gait training today with various techniques to facilitate proximal right hip stability and knee extension engagement in stance phase to promote left step length as well as facilitation of continuous steps. Improving performance with ability to negotiate stairs in a reciprocal pattern with min A 75% of the time. Continued sessions to advance gait and mobility to reduce risk for falls. Heavy facilitation for RLE stability with retrowalking  OBJECTIVE IMPAIRMENTS Abnormal gait, decreased activity tolerance, decreased balance, decreased coordination, decreased knowledge of use of DME, decreased mobility, difficulty walking, decreased strength, impaired sensation, impaired tone,  impaired UE functional use, and improper body mechanics.   ACTIVITY LIMITATIONS carrying, lifting, bending, standing, squatting, stairs, transfers, bathing, toileting, dressing, reach over head, and locomotion level  PARTICIPATION LIMITATIONS: meal prep, cleaning, laundry, interpersonal relationship, driving, shopping, community activity, occupation, yard work, and leisure activities  [place] Time since onset of injury/illness/exacerbation are also affecting patient's functional outcome.   REHAB POTENTIAL: Excellent  CLINICAL DECISION MAKING: Evolving/moderate complexity  EVALUATION COMPLEXITY: Moderate  PLAN: PT FREQUENCY: 1-2/wk  PT DURATION: 12 weeks  PLANNED INTERVENTIONS: Therapeutic exercises, Therapeutic activity, Neuromuscular re-education, Balance training, Gait training, Patient/Family education, Self Care, Joint mobilization, Stair training, Vestibular training, Canalith repositioning, Orthotic/Fit training, DME instructions, Aquatic Therapy, Dry Needling, Electrical stimulation, Spinal mobilization, Cryotherapy, Moist heat, Splintting, Taping, and Manual therapy  PLAN FOR NEXT SESSION: assist with platform RW to promote fast, continuous steps   1:23 PM, 09/22/22 M. Sherlyn Lees, PT, DPT Physical Therapist- [place] Office Number: [phone]   [place] at [facility] [address], [place], [place] [address] Phone # [phone] Fax # [phone]

## 2022-09-22 NOTE — Therapy (Signed)
OUTPATIENT OCCUPATIONAL THERAPY NEURO TREATMENT NOTE  Patient Name: Gordon Fisher MRN: [id number] DOB:[birth year], 61 y.o., male Today's Date: 09/22/2022  PCP: none on file REFERRING PROVIDER: Jennye Boroughs, MD    OT End of Session - 09/22/22 1401     Visit Number 15    Number of Visits 30    Date for OT Re-Evaluation 11/19/22    Authorization Type [employer]    Authorization Time Period VL: 60 combined (OT/PT/ST)    Authorization - Visit Number 15    Authorization - Number of Visits 20    OT Start Time 1401    OT Stop Time 1445    OT Time Calculation (min) 44 min    Activity Tolerance Patient tolerated treatment well    Behavior During Therapy WFL for tasks assessed/performed                   Past Medical History:  Diagnosis Date   Stroke [facility])    Past Surgical History:  Procedure Laterality Date   BUBBLE STUDY  02/15/2022   Procedure: BUBBLE STUDY;  Surgeon: Werner Lean, MD;  Location: [place];  Service: Cardiovascular;;   IR CT HEAD LTD  02/11/2022   IR CT HEAD LTD  02/11/2022   IR CT HEAD LTD  02/11/2022   IR PERCUTANEOUS ART THROMBECTOMY/INFUSION INTRACRANIAL INC DIAG ANGIO  02/11/2022   IR PERCUTANEOUS ART THROMBECTOMY/INFUSION INTRACRANIAL INC DIAG ANGIO  02/11/2022   IR US GUIDE VASC ACCESS RIGHT  02/11/2022   IR US GUIDE VASC ACCESS RIGHT  02/11/2022   RADIOLOGY WITH ANESTHESIA N/A 02/11/2022   Procedure: IR WITH ANESTHESIA;  Surgeon: Radiologist, Medication, MD;  Location: [place];  Service: Radiology;  Laterality: N/A;   RADIOLOGY WITH ANESTHESIA N/A 02/11/2022   Procedure: RADIOLOGY WITH ANESTHESIA;  Surgeon: Radiologist, Medication, MD;  Location: [place];  Service: Radiology;  Laterality: N/A;   TEE WITHOUT CARDIOVERSION N/A 02/15/2022   Procedure: TRANSESOPHAGEAL ECHOCARDIOGRAM (TEE);  Surgeon: Werner Lean, MD;  [facility] ENDOSCOPY;  Service: Cardiovascular;  Laterality: N/A;   Patient Active Problem List    Diagnosis Date Noted   Hypocalcemia 03/22/2022   DVT, lower extremity, distal ([place]) 03/22/2022   Obesity 03/22/2022   Elevated LDL cholesterol level 03/22/2022   Acute ischemic left middle cerebral artery (MCA) stroke ([place]) 02/18/2022   Cerebrovascular accident (CVA) due to occlusion of left middle cerebral artery ([place]) 02/11/2022    ONSET DATE: referral 07/15/22 (CVA 02/11/22)  REFERRING DIAG: I63.9 (ICD-10-CM) - Cerebrovascular accident (CVA), unspecified mechanism ([place]) R13.10 (ICD-10-CM) - Dysphagia, unspecified type   THERAPY DIAG:  Hemiplegia and hemiparesis following cerebral infarction affecting right dominant side (HCC)  Muscle weakness (generalized)  Unsteadiness on feet  Other lack of coordination  Other disturbances of skin sensation  Rationale for Evaluation and Treatment Rehabilitation  SUBJECTIVE:   SUBJECTIVE STATEMENT: Pt demonstrating that his goal is to return to shooting a gun/go hunting. 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 No  FALLS: Has patient fallen in last 6 months? Yes. Number of falls 1  PLOF: Independent, Independent with basic ADLs, and Independent with gait  PATIENT GOALS: continue to make gains, independence with getting dressed   OBJECTIVE:   HAND DOMINANCE: Right  FUNCTIONAL OUTCOME MEASURES: FOTO: 11  UPPER EXTREMITY ROM    Active ROM: Pt with  no AROM during evaluation.  Pt was able to elicit min shoulder elevation and slight gross grasp when cued and provided increased time to initiate movement.   09/22/22: Pt still with no AROM in upright position, is able to elicit slight internal rotation and mod elbow flexion/extension in gravity assisted position.  Passive ROM Right Eval - 9/25  Shoulder flexion 100  Shoulder abduction   Shoulder adduction   Shoulder  extension   Shoulder internal rotation WNL  Shoulder external rotation onset of pain >5-10* from neutral  Middle trapezius   Lower trapezius   Elbow flexion WNL  Elbow extension WNL  Wrist flexion WNL  Wrist extension WNL  Wrist ulnar deviation   Wrist radial deviation   Wrist pronation   Wrist supination   (Blank rows = not tested)  HAND FUNCTION: No functional grasp. Trace gross grasp with increased time for initiation  SENSATION: Difficult to assess due to expressive aphasia  MUSCLE TONE: RUE: Mild and Hypertonic  ------------------------------------------------------------------------------------------------------------------------------------------------------------ (objective measures above completed at initial evaluation unless otherwise dated)  TODAY'S TREATMENT:  09/22/22 ADL: pt is able to don shirt Supervision approx 75% of time.  Spouse reports that sometimes they are in a hurry, or he is too fatigued and she then assists him.  She reports that sometimes he may get stuck getting shirt pulled down torso or over shoulder.  Pt demonstrating donning/doffing pull over shirt with min cues for technique to ensure advancing shirt over elbow prior to donning shirt overhead and cues to adjust at shoulder. Kinesiotape: OT educated pt and spouse on purpose of kinesiotaping for increased positioning s/p shoulder subluxation.  OT applied kinesiotape to shoulder and at scapula for improved positioning and support. LB dressing: completed simulated LB dressing with donning/doffing pants with gait belt.  Pt able to complete with supervision, however still with no use of RUE even as stabilizer during clothing management.     09/20/22 PROM: shoulder flexion/extension, abduction/adduction, and internal/external rotation with therapist providing support at elbow to minimize pain/effects d/t subluxation.  PROM of wrist flexion/extension and ulnar/radial deviation.  Pt reports pain x1 with  external rotation, however no further complaints with any movements or repetition of external rotation.  WB: through full RUE in extension with hand placed over therapist's thigh to allow OT to further facilitate WB with direction changes.  Pt reaching across midline to reach for rings to further facilitate WB.  OT then repositioned pt's hand to flat on mat table with tactile cues/facilitation at elbow and shoulder for increased WB. Attempting shoulder activation post WB, however pt still with trace movement and requiring increased internal rotation and compensatory trunk movements to facilitate even the trace movement.   UE Ranger: forward shoulder flexion/extension and horizontal abduction/adduction with OT providing support at elbow to facilitate increased alignment and shoulder positioning due to subluxation.  Pt with increased difficulty with flexion, utilizing compensatory trunk movements to facilitate increased forward ROM.    09/15/22 Self-ROM: engaged in scapular retraction, shoulder shrugs, shoulder girdle upward rotation, shoulder girdle abduction/adduction, and elbow flexion/extension.  OT positioned pt in front of mirror for visual feedback and providing demonstration, facilitation, and tactile cues for improved initiation of each activity.  OT providing facilitation at shoulder girdle to further facilitate elevation and retraction.  OT providing verbal and written instructions to pt, pt's spouse, and spouse's sister to increase carryover of positioning and technique.  Pt requiring hand over hand assist for improved L hand placement to facilitate improved ROM/support of RUE.  OT educated spouse and spouse's sister on proper hand placement and cues to provide to facilitate increased technique and functional carryover. NMES: discussed possibility of use of e-stim for NMR.  Pt's family expressing desire to pursue e-stim and agreeable to OT reaching out to PM&R and cardiologist for  clearance.   PATIENT EDUCATION: Ongoing condition-specific education related to therapeutic interventions completed this session, discussed progression of goals Person educated: Patient and Spouse Education method: Explanation, Demonstration, Tactile cues, Verbal cues, and Handouts Education comprehension: verbalized understanding and needs further education   HOME EXERCISE PROGRAM: Written exercises of shoulder and elbow in supine with cues for hand placement and facilitation.   Self-ROM handout from [facility] lab  [place] Access Code: [email or web address] URL: [email or web address] - Supine Shoulder Flexion AAROM with Hands Clasped  - 1 x daily - 1 sets - 10 reps - Seated Elbow Extension and Shoulder External Rotation AAROM at Table with Towel  - 1 x daily - 1 sets - 10 reps - Finger Extension with Wrist Extension Caregiver PROM  - 1 x daily - 1 sets - 10 reps  GOALS: Goals reviewed with patient? Yes  SHORT TERM GOALS: Target date: 08/27/22  Pt and spouse will be independent with PROM and self-ROM HEP for shoulder stability and ROM. Baseline: Goal status: MET - 08/30/22  2.  Pt will verbalize understanding of task modifications and/or potential A/E needs to increase ease, safety, and independence w/ ADLs Baseline:  Goal status: MET -08/30/22  3.  Pt will verbalize understanding of strategies and use of RUE as stabilizer during ADLs. Baseline:  Goal status: MET - 08/30/22  New Short term goals: Target date: 10/22/22 Pt and spouse will be independent with advanced PROM and self-ROM HEP for shoulder stability and ROM. Baseline: Goal status: INITIAL  2.  Pt and spouse will verbalize understanding of AE and/or task modifications as needed for fastening shoes.  Baseline:  Goal status: INITIAL   LONG TERM GOALS: Target date: 09/24/22  Pt will demonstrate ability to complete UB dressing with supervision with use of hemi-dressing technique. Baseline:  Goal status: MET -  09/22/22  2.  Pt will demonstrate improved awareness of and positioning of RUE during mobility and self-care tasks. Baseline:  Goal status: MET - 09/22/22  3.  Pt will utilize RUE as gross assist during grooming tasks (such as washing hands and applying toothpaste). Baseline:  Goal status: NOT MET - pt able to utilize as stabilizer only 09/22/22  4.  Pt will verbalize and or demonstrate increased functional use of BUE during LB dressing to complete at supervision level. Baseline:  Goal status: IN PROGRESS - partly met.  Pt able to don pants with Supervision, however not utilizing RUE at all during activity 09/22/22  5.  Pt will verbalize improved functional incorporation of RUE with ADLs by reporting improved FOTO score to >/= to 20. Baseline: 11 Goal status: MET - 33 on FOTO score 09/08/22  New Long term goals: date 11/19/22  Patient will be able to incorporate RUE into UB/LB dressing task at stabilizer to gross assist level. Baseline: Goal status: INITIAL  2.   Pt will utilize RUE as gross assist during grooming tasks (such as washing hands and face). Baseline:  Goal status: INITIAL  3.   Pt and spouse will verbalize and/or  demonstrate understanding of advanced treatment strategies and implementation at home (NMES, WB) to facilitate increased motor recovery.  Baseline:  Goal status: INITIAL  4.   Pt will demonstrate ability  to utilize BUE during mobility tasks to pushup from chair with BUE on arm rests.  Baseline:  Goal status: INITIAL  5.   Pt will demonstrate ability to don/doff footwear (to include socks and shoes) with supervision and use of AE PRN.  Baseline:  Goal status: INITIAL   ASSESSMENT:  CLINICAL IMPRESSION: Session today with focus on self-care tasks, positioning and stability of RUE during self-care tasks, and discussion of progress towards goals.  Pt continues to demonstrate difficulty with shoulder AROM, frequently demonstrating compensatory strategies of  weight shifting, forward lean, and internal rotation.  R shoulder subluxation likely contributing to limitations.  Pt continues to express desire to regain function in RUE, discussed location of stroke and typical recovery process.  Plan to begin implementing NMES during future session as cleared by cardiologist Dr. Lars Mage.  PERFORMANCE DEFICITS in functional skills including ADLs, IADLs, coordination, sensation, tone, ROM, strength, pain, flexibility, FMC, GMC, mobility, balance, body mechanics, vision, and UE functional use and psychosocial skills including environmental adaptation and routines and behaviors.   IMPAIRMENTS are limiting patient from ADLs and IADLs.   COMORBIDITIES may have co-morbidities  that affects occupational performance. Patient will benefit from skilled OT to address above impairments and improve overall function.   PLAN: OT FREQUENCY: 1-2x/week  OT DURATION: 8 weeks  PLANNED INTERVENTIONS: self care/ADL training, therapeutic exercise, therapeutic activity, neuromuscular re-education, manual therapy, passive range of motion, balance training, functional mobility training, splinting, electrical stimulation, compression bandaging, moist heat, cryotherapy, patient/family education, visual/perceptual remediation/compensation, psychosocial skills training, energy conservation, coping strategies training, and DME and/or AE instructions  RECOMMENDED OTHER SERVICES: N/A  CONSULTED AND AGREED WITH PLAN OF CARE: Patient and family member/caregiver  PLAN FOR NEXT SESSION: Continue w/ scapular stability: shoulder shrugs, rolls, and scapular retraction; WB and dynamic standing, supine and sidelying NMR.  E-stim as cleared by MD.   Simonne Come, OTR/L 09/22/2022, 4:52 PM

## 2022-09-22 NOTE — Therapy (Signed)
OUTPATIENT PHYSICAL THERAPY NEURO TREATMENT   Patient Name: Gordon Fisher MRN: [id number] DOB:Nov 17, 1960, 61 y.o., male Today's Date: 09/27/2022   PCP: None listed REFERRING PROVIDER: Jennye Boroughs, MD    PT End of Session - 09/27/22 1146     Visit Number 16    Number of Visits 40    Date for PT Re-Evaluation 12/13/22    Authorization Type [employer] [id number]    Authorization - Visit Number 44    Authorization - Number of Visits 20    Progress Note Due on Visit 10    PT Start Time 1103    PT Stop Time 1144    PT Time Calculation (min) 41 min    Equipment Utilized During Treatment Gait belt    Activity Tolerance Patient tolerated treatment well    Behavior During Therapy WFL for tasks assessed/performed                    Past Medical History:  Diagnosis Date   Stroke [facility])    Past Surgical History:  Procedure Laterality Date   BUBBLE STUDY  02/15/2022   Procedure: BUBBLE STUDY;  Surgeon: Werner Lean, MD;  Location: [place];  Service: Cardiovascular;;   IR CT HEAD LTD  02/11/2022   IR CT HEAD LTD  02/11/2022   IR CT HEAD LTD  02/11/2022   IR PERCUTANEOUS ART THROMBECTOMY/INFUSION INTRACRANIAL INC DIAG ANGIO  02/11/2022   IR PERCUTANEOUS ART THROMBECTOMY/INFUSION INTRACRANIAL INC DIAG ANGIO  02/11/2022   IR US GUIDE VASC ACCESS RIGHT  02/11/2022   IR US GUIDE VASC ACCESS RIGHT  02/11/2022   RADIOLOGY WITH ANESTHESIA N/A 02/11/2022   Procedure: IR WITH ANESTHESIA;  Surgeon: Radiologist, Medication, MD;  Location: [place];  Service: Radiology;  Laterality: N/A;   RADIOLOGY WITH ANESTHESIA N/A 02/11/2022   Procedure: RADIOLOGY WITH ANESTHESIA;  Surgeon: Radiologist, Medication, MD;  Location: [place];  Service: Radiology;  Laterality: N/A;   TEE WITHOUT CARDIOVERSION N/A 02/15/2022   Procedure: TRANSESOPHAGEAL ECHOCARDIOGRAM (TEE);  Surgeon: Werner Lean, MD;  [facility] ENDOSCOPY;  Service: Cardiovascular;  Laterality: N/A;   Patient Active Problem  List   Diagnosis Date Noted   Hypocalcemia 03/22/2022   DVT, lower extremity, distal ([place]) 03/22/2022   Obesity 03/22/2022   Elevated LDL cholesterol level 03/22/2022   Acute ischemic left middle cerebral artery (MCA) stroke ([place]) 02/18/2022   Cerebrovascular accident (CVA) due to occlusion of left middle cerebral artery ([place]) 02/11/2022    ONSET DATE: April 2023  REFERRING DIAG: I63.9 (ICD-10-CM) - Cerebrovascular accident (CVA), unspecified mechanism ([place]) R13.10 (ICD-10-CM) - Dysphagia, unspecified type   THERAPY DIAG:  Hemiplegia and hemiparesis following cerebral infarction affecting right dominant side (HCC)  Muscle weakness (generalized)  Unsteadiness on feet  Other abnormalities of gait and mobility  Difficulty in walking, not elsewhere classified  Rationale for Evaluation and Treatment Rehabilitation  SUBJECTIVE:  SUBJECTIVE STATEMENT: No new problems with the AFO.  Pt accompanied by: significant other  PERTINENT HISTORY: 02/11/2022 patient developed right-sided weakness, aphasia, went to hospital for evaluation.  Code stroke was activated.  He received TNK and then transferred to [facility].  He underwent cerebral angiogram with thrombectomy.  Unfortunately vessel reoccluded later the day and the second thrombectomy was attempted.  Left M2 dissection was identified and treated with stent but unfortunately stent demonstrated reocclusion   PAIN:  Are you having pain? No  PRECAUTIONS: Shoulder and Fall, Aphasia   WEIGHT BEARING RESTRICTIONS No  FALLS: Has patient fallen in last 6 months? Yes. Number of falls 1, required 3-assist to recover  LIVING ENVIRONMENT: Lives with: lives with their spouse Lives in: House/apartment Stairs: Yes: Internal: flight steps; bilateral  but cannot reach both and External: 4-5 steps; ramp is also present.  Has following equipment at home: Hemi walker, Wheelchair (manual), and sit-stand lift  PLOF: Independent  PATIENT GOALS regain independence  OBJECTIVE:     TODAY'S TREATMENT: 09/27/22 Activity Comments  gait with AFO and platform walker + PT pulls walker to maintain stepping continuity 288f, 1564f Much improved R foot clearance however R hip flexor fatigues with longer duration ambulation, leading to catching toes on floor; good safe turns to L side, more difficulty to R; cues to increase L step length   Gait with hemi walker on L x1504fwith quad cane [id number] smoother and more continuous gait with good step length,   standing hip ABD 10x each B hands resting on handrail; PT assist on R d/t poor control/weakness   Sidestepping alt with yellow loop above knees, then without TB Occasional PT assist to increase step length and to maintain R knee extended     PATIENT EDUCATION: Education details: edu on use of tenodesis grip on R hand; edu on small based quad cane, edu on toe cap Person educated: Patient and Spouse Education method: Explanation, Demonstration, Tactile cues, and Verbal cues Education comprehension: verbalized understanding and returned demonstration    HOME EXERCISE PROGRAM Last updated: 09/15/22 Access Code: [email or web address]: [email or web address] Date: 09/15/2022 Prepared by: [facility]  Exercises - Forward Backward Weight Shift with Counter Support  - 1 x daily - 5 x weekly - 2 sets - 10 reps - Side to Side Weight Shift with Counter Support  - 1 x daily - 5 x weekly - 2 sets - 10 reps - Mini Squat with Counter Support  - 1 x daily - 5 x weekly - 2 sets - 10 reps - Stride Stance Weight Shift  - 1 x daily - 5 x weekly - 2 sets - 10 reps - Sit to stand in stride stance  - 2 x daily - 7 x weekly - 1-2 sets - 5 reps - Supine Bridge with Mini  Swiss Ball Between Knees  - 1 x daily - 5 x weekly - 2 sets - 10 reps - Supine Heel Slide  - 1 x daily - 5 x weekly - 2 sets - 10 reps - Seated Isometric Hip Adduction with Ball  - 1 x daily - 5 x weekly - 2 sets - 10 reps - 3 sec hold    Below measures were taken at time of initial evaluation unless otherwise specified:     DIAGNOSTIC FINDINGS: Follow-up MRI brain done revealing small areas of acute infarct in left-MCA territory with mild hemorrhagic transformation in left  parietal lobe and left-MCA bifurcation residual vascular thrombus   COGNITION: Overall cognitive status:  Difficult to assess due to aphasia   SENSATION: Light touch: Impaired  Proprioception: Impaired   COORDINATION: RLE impaired, some deficits on left foot with alternating movements and isolated movements  EDEMA:  None   MUSCLE TONE: RLE: Modifed Ashworth Scale 2 = More marked increase in muscle tone through most of the ROM, but affected part(s) easily moved right quad most affected, no clonus right plantarflexors   MUSCLE LENGTH: Able to achieve full right knee extension to PROM  DTRs:  Patella 3+ = Brisk  POSTURE: No Significant postural limitations  LOWER EXTREMITY ROM:     LLE WNL, RLE AROM limited by weakness  LOWER EXTREMITY MMT:    MMT Right Eval Left Eval  Hip flexion 3- 5  Hip extension    Hip abduction 3- 4  Hip adduction 3- 5  Hip internal rotation    Hip external rotation    Knee flexion 3 5  Knee extension 2+ 5  Ankle dorsiflexion 0 5  Ankle plantarflexion 1 3+  Ankle inversion    Ankle eversion    (Blank rows = not tested)  BED MOBILITY:  NT  TRANSFERS: Assistive device utilized: Hemi walker  Sit to stand: Modified independence Stand to sit: SBA Chair to chair: SBA and CGA Floor:  Total assist  RAMP:  Level of Assistance: CGA Assistive device utilized: Hemi walker Ramp Comments:   CURB:  Level of Assistance: Min A Assistive device utilized: Office manager Comments: cues in sequence  STAIRS:  Level of Assistance: CGA and Min A  Stair Negotiation Technique: Step to Pattern with Single Rail on Left  Number of Stairs: 10   Height of Stairs: 4-6"  Comments: cues and guarding  GAIT: Gait pattern: step to pattern, decreased ankle dorsiflexion- Right, and circumduction- Right Distance walked: 82 Assistive device utilized: Hemi walker Level of assistance: CGA Comments: deficits during turning  FUNCTIONAL TESTs:  5 times sit to stand: 11.59 sec with 3 retro LOB and poor eccentric control Timed up and go (TUG): 34.75 sec Berg Balance Scale: 32/56 Dynamic Gait Index: NT 2 minute walk test: 82 ft w/ hemi-walker, 0.68 ft/sec  M-CTSIB  Condition 1: Firm Surface, EO 30 Sec, Normal and Mild Sway  Condition 2: Firm Surface, EC 30 Sec, Mild and Moderate Sway  Condition 3: Foam Surface, EO 30 Sec, Mild Sway  Condition 4: Foam Surface, EC 26 Sec, Moderate and Severe Sway     PATIENT SURVEYS:  FOTO 47%  TODAY'S TREATMENT:  assessment   PATIENT EDUCATION: Education details: assessment findings, PT scope of practice Person educated: Patient and Spouse Education method: Explanation Education comprehension: verbalized understanding   HOME EXERCISE PROGRAM: TBD    GOALS: Goals reviewed with patient? Yes  SHORT TERM GOALS: Target date: 11/01/2022    Patient will perform HEP with family/caregiver supervision for improved strength, balance, transfers, and gait  Baseline: Goal status: GOAL MET, 08/23/2022  2.  Ambulate 12 stairs with left HR and supervision to improve safety within home Baseline: CGA-min A; per report CGA/supervision 6 steps; able to perform Goal status: MET  3.  Demonstrate improved safety and efficiency with ambulation per distance of 100 ft during 2MWT Baseline: 82 ft w/ HW and CGA; 111 ft 08/23/2022 Goal status: GOAL MET, 08/23/2022  4. Demonstrate improved gait speed to 1.8 ft/sec with right AFO and  least restrictive AD to improve efficiency of gait  Baseline: 0.68 ft/sec at eval; (09/20/22) 1.59 ft/sec w/ platform RW and AFO  Goal status: On-going    LONG TERM GOALS: Target date: 12/13/2022    Demonstrate improved balance and reduced risk for falls per score 45/56 Berg Balance Test Baseline: 32/56; (09/20/22) 42/56 Goal status:Goal revised  2.  Demonstrate modified independent ambulation x 150 ft level surfaces using least restrictive AD in order to improve safety with home environment Baseline: CGA w/ HW; (09/20/22) supervision with HW level surfaces Goal status: IN PROGRESS  3.  Manifest improved safety with gait per time of 18 sec TUG test using least restrictive AD Baseline: 34.75 sec w/ HW; (09/20/22) 30.69 w/ HW and right AFO Goal status: IN PROGRESS   4. Stair ambulation x 15 steps with set-up assist to improve safety in home environment and reduce burden of care  Baseline: SBA with single HR, using AFO  Goal status: On-going      ASSESSMENT:  CLINICAL IMPRESSION: Patient arrived to session with R AFO donned- reports no issues with this. Practiced gait training with platform walker with cueing to increase L step length and R step height as it was evident that R toes still caught on the floor with R hip fatigue. Patient with excellent improvement in continuity and reciprocal gait pattern which transferred well to hemi walker and quad cane. Worked on activating hip abductors in standing for remainder of session.   OBJECTIVE IMPAIRMENTS Abnormal gait, decreased activity tolerance, decreased balance, decreased coordination, decreased knowledge of use of DME, decreased mobility, difficulty walking, decreased strength, impaired sensation, impaired tone, impaired UE functional use, and improper body mechanics.   ACTIVITY LIMITATIONS carrying, lifting, bending, standing, squatting, stairs, transfers, bathing, toileting, dressing, reach over head, and locomotion  level  PARTICIPATION LIMITATIONS: meal prep, cleaning, laundry, interpersonal relationship, driving, shopping, community activity, occupation, yard work, and leisure activities  [place] Time since onset of injury/illness/exacerbation are also affecting patient's functional outcome.   REHAB POTENTIAL: Excellent  CLINICAL DECISION MAKING: Evolving/moderate complexity  EVALUATION COMPLEXITY: Moderate  PLAN: PT FREQUENCY: 1-2/wk  PT DURATION: 12 weeks  PLANNED INTERVENTIONS: Therapeutic exercises, Therapeutic activity, Neuromuscular re-education, Balance training, Gait training, Patient/Family education, Self Care, Joint mobilization, Stair training, Vestibular training, Canalith repositioning, Orthotic/Fit training, DME instructions, Aquatic Therapy, Dry Needling, Electrical stimulation, Spinal mobilization, Cryotherapy, Moist heat, Splintting, Taping, and Manual therapy  PLAN FOR NEXT SESSION: assist with platform RW to promote fast, continuous steps, quad cane gait training, hip ABD strengthening    Janene Harvey, PT, DPT 09/27/22 11:49 AM  [place] [facility] [address] [place], [place] [address] Phone # [phone] Fax # [phone]

## 2022-09-27 ENCOUNTER — Ambulatory Visit: Payer: 59 | Admitting: Physical Therapy

## 2022-09-27 ENCOUNTER — Encounter: Payer: Self-pay | Admitting: Physical Therapy

## 2022-09-27 ENCOUNTER — Ambulatory Visit: Payer: 59 | Admitting: Occupational Therapy

## 2022-09-27 ENCOUNTER — Ambulatory Visit: Payer: 59

## 2022-09-27 DIAGNOSIS — R2681 Unsteadiness on feet: Secondary | ICD-10-CM

## 2022-09-27 DIAGNOSIS — R262 Difficulty in walking, not elsewhere classified: Secondary | ICD-10-CM

## 2022-09-27 DIAGNOSIS — R482 Apraxia: Secondary | ICD-10-CM

## 2022-09-27 DIAGNOSIS — M6281 Muscle weakness (generalized): Secondary | ICD-10-CM

## 2022-09-27 DIAGNOSIS — R2689 Other abnormalities of gait and mobility: Secondary | ICD-10-CM

## 2022-09-27 DIAGNOSIS — I69351 Hemiplegia and hemiparesis following cerebral infarction affecting right dominant side: Secondary | ICD-10-CM | POA: Diagnosis not present

## 2022-09-27 DIAGNOSIS — R4701 Aphasia: Secondary | ICD-10-CM

## 2022-09-27 NOTE — Therapy (Signed)
OUTPATIENT SPEECH LANGUAGE PATHOLOGY TREATMENT   Patient Name: Gordon Fisher MRN: [id number] DOB:11/22/1960, 61 y.o., male Today's Date: 09/27/2022  PCP: TBD REFERRING PROVIDER: Shtridelman, Yuri, Fisher    End of Session - 09/27/22 1100     Visit Number 10    Number of Visits 25    Date for SLP Re-Evaluation 10/24/22    Authorization - Visit Number 10    Authorization - Number of Visits 20    SLP Start Time 0943    SLP Stop Time  1030    SLP Time Calculation (min) 47 min    Activity Tolerance Patient tolerated treatment well                  Past Medical History:  Diagnosis Date   Stroke (HCC)    Past Surgical History:  Procedure Laterality Date   BUBBLE STUDY  02/15/2022   Procedure: BUBBLE STUDY;  Surgeon: Fisher, Gordon Fisher, Fisher;  Location: [facility];  Service: Cardiovascular;;   IR CT HEAD LTD  02/11/2022   IR CT HEAD LTD  02/11/2022   IR CT HEAD LTD  02/11/2022   IR PERCUTANEOUS ART THROMBECTOMY/INFUSION INTRACRANIAL INC DIAG ANGIO  02/11/2022   IR PERCUTANEOUS ART THROMBECTOMY/INFUSION INTRACRANIAL INC DIAG ANGIO  02/11/2022   IR US GUIDE VASC ACCESS RIGHT  02/11/2022   IR US GUIDE VASC ACCESS RIGHT  02/11/2022   RADIOLOGY WITH ANESTHESIA N/Fisher 02/11/2022   Procedure: IR WITH ANESTHESIA;  Surgeon: Gordon Fisher;  Location: [facility];  Service: Radiology;  Laterality: N/Fisher;   RADIOLOGY WITH ANESTHESIA N/Fisher 02/11/2022   Procedure: RADIOLOGY WITH ANESTHESIA;  Surgeon: Gordon Fisher;  Location: [facility];  Service: Radiology;  Laterality: N/Fisher;   TEE WITHOUT CARDIOVERSION N/Fisher 02/15/2022   Procedure: TRANSESOPHAGEAL ECHOCARDIOGRAM (TEE);  Surgeon: Fisher, Gordon Fisher, Fisher;  Location: [facility];  Service: Cardiovascular;  Laterality: N/Fisher;   Patient Active Problem List   Diagnosis Date Noted   Hypocalcemia 03/22/2022   DVT, lower extremity, distal (HCC) 03/22/2022   Obesity 03/22/2022   Elevated LDL cholesterol level 03/22/2022   Acute  ischemic left middle cerebral artery (MCA) stroke (HCC) 02/18/2022   Cerebrovascular accident (CVA) due to occlusion of left middle cerebral artery (HCC) 02/11/2022    ONSET DATE: 02-11-22   REFERRING DIAG: I63.9 (ICD-10-CM) - Cerebrovascular accident (CVA), unspecified mechanism (HCC) R13.10 (ICD-10-CM) - Dysphagia, unspecified type   THERAPY DIAG:  Verbal apraxia  Aphasia  Rationale for Evaluation and Treatment Rehabilitation  SUBJECTIVE:   SUBJECTIVE STATEMENT: "Yeah." Pt accompanied by: self  PERTINENT HISTORY: Gordon Fisher is Fisher 60 year old male in relatively good health who was admitted via [facility] on 02/11/22 with right sided weakness, right facial droop and slurred speech. CTH showed L-MCA hyperdense sign and he received TNK prior to transfer. CTA showed L-MCA MI occlusion and he underwent cerebral angio with thrombectomy and TICI 3 flow by Dr. DeMarcedo /Rodriguez.  Follow up MRI brain done revealing small areas of acute infarct infarct in L-MCA territory with mild hemorrhagic transformation in left parietal lobe and L-MCA bifurcation residual vascular thrombus. He was taken back for thrombectomy with stent for dissection but had intra-stent occlusion which was unable to be recanalized. Post op CT showed small SAH which was stable on follow up. He was started on ASA/Brillinta. MBS 04/18 showed oral dysphagia with oral delay and no penetration or aspiration. He was started on D3, thins with supervision. Repeat MRI brain on 04/16 showed extensive evolving acute   ischemic L-MCA occlusion increased in size with associated edema, few additional small acute cortical subcortical infarcts involving contralateral right frontal lobe and left ACA distribution. Stroke felt to be due to unknown etiology and wife elected on Zio patch on discharge to rule out Fisher fib. 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 Gordon Fisher was d/c'd from CIR on 03-19-22, and has had HHST since that time until last week.   PAIN:  Are you having pain? No  PATIENT GOALS Pt indicated he would like talking to improve  OBJECTIVE:  DIAGNOSTIC FINDINGS: MRI without contrast 02/13/22 IMPRESSION: 1. Extensive evolving acute ischemic left MCA distribution infarct, increased in size as compared to previous MRI from 02/11/2022. Associated edema without significant midline shift. 2. Small volume acute subarachnoid hemorrhage at the posterior aspect of the left Sylvian fissure, stable. 3. Few additional small volume acute ischemic cortical and subcortical infarcts involving the contralateral right frontal lobe and left ACA distribution as above. 4. Underlying chronic microvascular ischemic disease.    PATIENT REPORTED OUTCOME MEASURES (PROM): Communication Effectiveness Survey: Pt scored 8/32 - higher scores indicate more effective communication/QOL.   TODAY'S TREATMENT:  09/27/22: Lingraphica not yet sent from [place]. Should be here by pt's next appointment.  Gordon Fisher told SLP names of people at their Thanksgiving celebration, with usual mod to max assist. Visual cues were most effective on pt's most difficult words. SLP assessed pt's reading comprehension - 80% with 4-5 words.   09/08/22: Gordon Fisher stated they had been busy this week with appointments but still work at practicing some. SLP initiated Fisher 20-minute discussion today with Gordon Fisher, Gordon Fisher, and SLP explaining speech generating device in depth, showing Gordon Fisher and Gordon Fisher options for trial device and deciding on the Lingraphica Touch Talk. SLP to make referral for this in the next few days.  Family names were written with mod-max Fisher usually - pt got first 3 letters correct with childrens and grandchildrens' names. SLP showed Gordon Fisher how to break up daughter's name to [id number] to encourage brain's processing in Fisher different way in order to foster new neural pathways to say the  name.  09/06/22: SLP targeted verbal expression with picture naming and divergent naming. Pt notably demonstrates he knows the word/s he wants to say but is finding it difficult to pronounce them (verbal apraxia). Pt's expression improves with visual cues (oral cues from SLP for initial phoneme/sound) - 75% successful even without sound necessary - just visual cue for articulator placement.   09/01/22: SLP worked with pt's verbal expression and auditory comprehension today. Simple conversation/sentences were understood 90% of the time, 100% with one repeat by speaker. With opposites/paired words, pt had 70% success, improved to 90% with min Fisher. With cloze phrases/sentences pt's response accuracy improved with 1 syllable words, but overall success was 17% with min Fisher, improved to 50% with mod-max Fisher. Often, pt knew the response but knew he was going to misarticulate the response. Written cues were helpful for pt here. SLP encouraged wife to cont with cloze phrases and paired words at home.  08/25/22: Gordon Fisher brought pt's cards today and SLP reviewed them with pt, modleing to wife cueing methods and hierarchy.Pt functionally said 6/7 salient objects (without written words) with mod-max cues, and 1/7 spontaneously. He said family names with written cues 6/7 (functionally), and 1/7 wth max cues ("[name]"). Pt demo'd understnading of directions in context 75% today. SLP introduce possibility of Lingraphica to pt and   wife and educated about studies proving that AAC assists verbal communication and that pt may not have to have assistance with Fisher AAC device but we will know more as time progresses.   08/23/22: SLP targeted verbal expression with pt today. Heavy perseveration with "[name]" grandson's name for all J names. SLP even did months of the year and pt cont to have perseveration with [name] for all J names. "[name]" - pt cont to make this into two syllables 90% of the time, with consistent max Fisher. Pt used multi-modal  communization using chicken clucks with SLP assistance and encouragement when pt could not say "chickens", and pointed to ages of his grandchildren with SLP writing numbers 1-10. Mod-max Fisher needed for grandson 2 months old. Pt showed 1 and then gestured taking Fisher short person's height for SLP to know young child. MAX Fisher needed for month names > 1 syllable. With one syllable months pt 80% success. Pt told SLP name of his truck, and functionally told SLP name of one of his two tractors. Req'd mod/max Fisher otherwise. Pt to cont to practice salient/high frequency/high importance words.   08/18/22: SLP targeted spelling today with pt - 3 letter words 100%, 4-letter words 80% - [name] stated pt had s/sx dyslexia prior to CVA. SLP to monitor this. SLP provided some other hints/helpful suggestions for home practice and communication at home today. SLP noted pt read 3 and 4 letter words 50% accuracy when spelled correctly.   08/16/22: SLP assessed pt's reading comprehension - pt reading single words 6/6 and simple sentences of 6-9 words ("the boy is riding his bike in the park") 6/6. [name] said pt having difficulty reading newspaper from home. SLP did reading task (newspaper) with pt today. SLP highlighted key words for pt and he answered 4/6 questions correct by pt answering yes/no and pointing to correct portion in the article to indicate place where the answer was found. Pt will return CES next session or two.    PATIENT EDUCATION: Education details: see "Today's therapy" Person educated: Patient and Spouse Education method: Explanation and Demonstration Education comprehension: verbalized understanding and needs further education   GOALS: Goals reviewed with patient? Yes, in general  SHORT TERM GOALS: Target date: 08/31/2022 extended one week due to visits  Pt will name 6 salient family names when given picture and written name with consistent mod-max cues in 3 sessions (precursor for speech generating  device) Baseline:08-25-22. 09-01-22 Goal status: partially met  2.  Pt will produce simple phoneme/ bilabial+/Fisher/  /Fisher/, /ba/, /ma/, and/or /pa/ simultaneously with SLP 65% success (equal # reps/stimulus) in 2 sessions Baseline:  Goal status: deferred - pt to focus on salient items/terms  3.  Pt will demo understanding of simple commands in context 80% over three sessions Baseline: 08-25-22, 09-01-22 Goal status: Partially met  4.  Pt will indicate understanding of Fisher complex sentence 85% of the time in 3 sessions (precursor for speech generating device) Baseline:  Goal status: not met  5.  Pt will copy written family names with 50% success in two sessions  Baseline:  Goal Status: met   LONG TERM GOALS: Target date: 10/24/2022  (or 20th visit)  Pt will name 6 salient family names when given picture and written name with consistent mod cues in 3 sessions (precursor for speech generating device) Baseline:  Goal status: Ongoing  2.  Pt will IMITATE simple phoneme/bilabial+/Fisher/  /Fisher/, /ba/, /ma/, and/or /pa/  65% success (equal # reps/stimulus) in 2 sessions Baseline:  Goal status: deferred - working with pt on more salient words  3.  Pt will generate meaningful message understood by Lisa using speech generating device 60% of the time in 3 sessions Baseline:  Goal status: Ongoing  4.  Lisa will indicate that spontaneous communication is easier than during week of evaluation Baseline:  Goal status: Ongoing   ASSESSMENT:  CLINICAL IMPRESSION: He is Fisher 61 y.o. male who cont to be seen today for treatment of verbal apraxia (as pt often indicates he knows the response but knows it will not be articulated correctly) and aphasia. Lingraphica referral to be made this week. SEE [place] NOTE from today for more details.   OBJECTIVE IMPAIRMENTS include expressive language, receptive language, and aphasia. These impairments are limiting patient from return to work, managing medications, managing  appointments, household responsibilities, ADLs/IADLs, and effectively communicating at home and in community. Factors affecting potential to achieve goals and functional outcome are ability to learn/carryover information and severity of impairments. Patient will benefit from skilled SLP services to address above impairments and improve overall function.  REHAB POTENTIAL: Good  PLAN: SLP FREQUENCY: 2x/week  SLP DURATION: 12 weeks  PLANNED INTERVENTIONS: Language facilitation, Environmental controls, Cueing hierachy, Internal/external aids, Functional tasks, and Multimodal communication approach    SCHINKE,CARL, CCC-SLP 09/27/2022, 11:00 AM 

## 2022-10-06 ENCOUNTER — Ambulatory Visit: Payer: 59 | Admitting: Physical Therapy

## 2022-10-06 ENCOUNTER — Ambulatory Visit: Payer: 59

## 2022-10-06 ENCOUNTER — Encounter: Payer: 59 | Admitting: Occupational Therapy

## 2022-10-07 NOTE — Therapy (Signed)
OUTPATIENT PHYSICAL THERAPY NEURO TREATMENT   Patient Name: Gordon Fisher MRN: [id number] DOB:[birth year], 61 y.o., male Today's Date: 10/08/2022   PCP: None listed REFERRING PROVIDER: Jennye Boroughs, MD    PT End of Session - 10/08/22 1016     Visit Number 17    Number of Visits 40    Date for PT Re-Evaluation 12/13/22    Authorization Type [employer] [id number]    Authorization - Number of Visits 20    Progress Note Due on Visit 10    PT Start Time 0933    PT Stop Time 1013    PT Time Calculation (min) 40 min    Equipment Utilized During Treatment Gait belt    Activity Tolerance Patient tolerated treatment well    Behavior During Therapy WFL for tasks assessed/performed                     Past Medical History:  Diagnosis Date   Stroke [facility])    Past Surgical History:  Procedure Laterality Date   BUBBLE STUDY  02/15/2022   Procedure: BUBBLE STUDY;  Surgeon: Werner Lean, MD;  Location: [place];  Service: Cardiovascular;;   IR CT HEAD LTD  02/11/2022   IR CT HEAD LTD  02/11/2022   IR CT HEAD LTD  02/11/2022   IR PERCUTANEOUS ART THROMBECTOMY/INFUSION INTRACRANIAL INC DIAG ANGIO  02/11/2022   IR PERCUTANEOUS ART THROMBECTOMY/INFUSION INTRACRANIAL INC DIAG ANGIO  02/11/2022   IR US GUIDE VASC ACCESS RIGHT  02/11/2022   IR US GUIDE VASC ACCESS RIGHT  02/11/2022   RADIOLOGY WITH ANESTHESIA N/A 02/11/2022   Procedure: IR WITH ANESTHESIA;  Surgeon: Radiologist, Medication, MD;  Location: [place];  Service: Radiology;  Laterality: N/A;   RADIOLOGY WITH ANESTHESIA N/A 02/11/2022   Procedure: RADIOLOGY WITH ANESTHESIA;  Surgeon: Radiologist, Medication, MD;  Location: [place];  Service: Radiology;  Laterality: N/A;   TEE WITHOUT CARDIOVERSION N/A 02/15/2022   Procedure: TRANSESOPHAGEAL ECHOCARDIOGRAM (TEE);  Surgeon: Werner Lean, MD;  [facility] ENDOSCOPY;  Service: Cardiovascular;  Laterality: N/A;   Patient Active Problem List   Diagnosis Date Noted    Hypocalcemia 03/22/2022   DVT, lower extremity, distal ([place]) 03/22/2022   Obesity 03/22/2022   Elevated LDL cholesterol level 03/22/2022   Acute ischemic left middle cerebral artery (MCA) stroke ([place]) 02/18/2022   Cerebrovascular accident (CVA) due to occlusion of left middle cerebral artery ([place]) 02/11/2022    ONSET DATE: April 2023  REFERRING DIAG: I63.9 (ICD-10-CM) - Cerebrovascular accident (CVA), unspecified mechanism ([place]) R13.10 (ICD-10-CM) - Dysphagia, unspecified type   THERAPY DIAG:  Muscle weakness (generalized)  Unsteadiness on feet  Other abnormalities of gait and mobility  Difficulty in walking, not elsewhere classified  Rationale for Evaluation and Treatment Rehabilitation  SUBJECTIVE:  SUBJECTIVE STATEMENT: Gordon Fisher reports the pt is a little cognitively slower today. No recent falls, no pain.  Pt accompanied by: significant other  PERTINENT HISTORY: 02/11/2022 patient developed right-sided weakness, aphasia, went to hospital for evaluation.  Code stroke was activated.  He received TNK and then transferred to [facility].  He underwent cerebral angiogram with thrombectomy.  Unfortunately vessel reoccluded later the day and the second thrombectomy was attempted.  Left M2 dissection was identified and treated with stent but unfortunately stent demonstrated reocclusion   PAIN:  Are you having pain? No  PRECAUTIONS: Shoulder and Fall, Aphasia   WEIGHT BEARING RESTRICTIONS No  FALLS: Has patient fallen in last 6 months? Yes. Number of falls 1, required 3-assist to recover  LIVING ENVIRONMENT: Lives with: lives with their spouse Lives in: House/apartment Stairs: Yes: Internal: flight steps; bilateral but cannot reach both and External: 4-5 steps; ramp is also present.  Has  following equipment at home: Hemi walker, Wheelchair (manual), and sit-stand lift  PLOF: Independent  PATIENT GOALS regain independence  OBJECTIVE:     TODAY'S TREATMENT: 10/08/22 Activity Comments  Nustep L1 x 4 min For reciprocal movement pattern and increased R LE use; cueing to avoid excessive R hip ER  gait with quad cane Working on timing of R foot step-was slower today  Figure 8 turns with quad cane, sharp pivot turns Cueing to "march steps" with turn  Forward/back stepping over 1/2 foam roll 5x R, 5x L, 5x L over hurdle Cueing to bring R knee to chest upon stepping over obstacle, and shifting onto stabilizing R LE upon stepping with L foot   Stair navigation 2x L foot dominant pattern; fairly good stability with 1 UE  R step ups 2x10 With PT supporting R knee to avoid buckling and recurvatum  R foot tap on 6" step 2x5 PT providing mod A to help lift leg; able to perform a few times independently        [place] Last updated: 09/15/22 Access Code: [email or web address] URL: [email or web address] Date: 09/15/2022 Prepared by: [facility]  Exercises - Forward Backward Weight Shift with Counter Support  - 1 x daily - 5 x weekly - 2 sets - 10 reps - Side to Side Weight Shift with Counter Support  - 1 x daily - 5 x weekly - 2 sets - 10 reps - Mini Squat with Counter Support  - 1 x daily - 5 x weekly - 2 sets - 10 reps - Stride Stance Weight Shift  - 1 x daily - 5 x weekly - 2 sets - 10 reps - Sit to stand in stride stance  - 2 x daily - 7 x weekly - 1-2 sets - 5 reps - Supine Bridge with Mini Swiss Ball Between Knees  - 1 x daily - 5 x weekly - 2 sets - 10 reps - Supine Heel Slide  - 1 x daily - 5 x weekly - 2 sets - 10 reps - Seated Isometric Hip Adduction with Ball  - 1 x daily - 5 x weekly - 2 sets - 10 reps - 3 sec hold    Below measures were taken at time of initial evaluation unless otherwise specified:      DIAGNOSTIC FINDINGS: Follow-up MRI brain done revealing small areas of acute infarct in left-MCA territory with mild hemorrhagic transformation in left parietal lobe and left-MCA bifurcation residual vascular thrombus   COGNITION: Overall cognitive status:  Difficult to assess due to aphasia   SENSATION: Light touch: Impaired  Proprioception: Impaired   COORDINATION: RLE impaired, some deficits on left foot with alternating movements and isolated movements  EDEMA:  None   MUSCLE TONE: RLE: Modifed Ashworth Scale 2 = More marked increase in muscle tone through most of the ROM, but affected part(s) easily moved right quad most affected, no clonus right plantarflexors   MUSCLE LENGTH: Able to achieve full right knee extension to PROM  DTRs:  Patella 3+ = Brisk  POSTURE: No Significant postural limitations  LOWER EXTREMITY ROM:     LLE WNL, RLE AROM limited by weakness  LOWER EXTREMITY MMT:    MMT Right Eval Left Eval  Hip flexion 3- 5  Hip extension    Hip abduction 3- 4  Hip adduction 3- 5  Hip internal rotation    Hip external rotation    Knee flexion 3 5  Knee extension 2+ 5  Ankle dorsiflexion 0 5  Ankle plantarflexion 1 3+  Ankle inversion    Ankle eversion    (Blank rows = not tested)  BED MOBILITY:  NT  TRANSFERS: Assistive device utilized: Hemi walker  Sit to stand: Modified independence Stand to sit: SBA Chair to chair: SBA and CGA Floor:  Total assist  RAMP:  Level of Assistance: CGA Assistive device utilized: Hemi walker Ramp Comments:   CURB:  Level of Assistance: Min A Assistive device utilized: Nutritional therapist Comments: cues in sequence  STAIRS:  Level of Assistance: CGA and Min A  Stair Negotiation Technique: Step to Pattern with Single Rail on Left  Number of Stairs: 10   Height of Stairs: 4-6"  Comments: cues and guarding  GAIT: Gait pattern: step to pattern, decreased ankle dorsiflexion- Right, and circumduction-  Right Distance walked: 82 Assistive device utilized: Hemi walker Level of assistance: CGA Comments: deficits during turning  FUNCTIONAL TESTs:  5 times sit to stand: 11.59 sec with 3 retro LOB and poor eccentric control Timed up and go (TUG): 34.75 sec Berg Balance Scale: 32/56 Dynamic Gait Index: NT 2 minute walk test: 82 ft w/ hemi-walker, 0.68 ft/sec  M-CTSIB  Condition 1: Firm Surface, EO 30 Sec, Normal and Mild Sway  Condition 2: Firm Surface, EC 30 Sec, Mild and Moderate Sway  Condition 3: Foam Surface, EO 30 Sec, Mild Sway  Condition 4: Foam Surface, EC 26 Sec, Moderate and Severe Sway     PATIENT SURVEYS:  FOTO 47%  TODAY'S TREATMENT:  assessment   PATIENT EDUCATION: Education details: assessment findings, PT scope of practice Person educated: Patient and Spouse Education method: Explanation Education comprehension: verbalized understanding   HOME EXERCISE PROGRAM: TBD    GOALS: Goals reviewed with patient? Yes  SHORT TERM GOALS: Target date: 11/01/2022    Patient will perform HEP with family/caregiver supervision for improved strength, balance, transfers, and gait  Baseline: Goal status: GOAL MET, 08/23/2022  2.  Ambulate 12 stairs with left HR and supervision to improve safety within home Baseline: CGA-min A; per report CGA/supervision 6 steps; able to perform Goal status: MET  3.  Demonstrate improved safety and efficiency with ambulation per distance of 100 ft during 2MWT Baseline: 82 ft w/ HW and CGA; 111 ft 08/23/2022 Goal status: GOAL MET, 08/23/2022  4. Demonstrate improved gait speed to 1.8 ft/sec with right AFO and least restrictive AD to improve efficiency of gait  Baseline: 0.68 ft/sec at eval; (09/20/22) 1.59 ft/sec w/ platform RW and AFO  Goal status:  On-going    LONG TERM GOALS: Target date: 12/13/2022    Demonstrate improved balance and reduced risk for falls per score 45/56 Berg Balance Test Baseline: 32/56; (09/20/22)  42/56 Goal status:Goal revised  2.  Demonstrate modified independent ambulation x 150 ft level surfaces using least restrictive AD in order to improve safety with home environment Baseline: CGA w/ HW; (09/20/22) supervision with HW level surfaces Goal status: IN PROGRESS  3.  Manifest improved safety with gait per time of 18 sec TUG test using least restrictive AD Baseline: 34.75 sec w/ HW; (09/20/22) 30.69 w/ HW and right AFO Goal status: IN PROGRESS   4. Stair ambulation x 15 steps with set-up assist to improve safety in home environment and reduce burden of care  Baseline: SBA with single HR, using AFO  Goal status: On-going      ASSESSMENT:  CLINICAL IMPRESSION: Patient arrived to session with wife who reported some slowed cognition today. This was not overtly evident during today's session, as patient was able to follow commands as usual. Worked on gait training, working on timing of R foot stepping. Good stability evident. Performed turns with cueing to increase step length for max safety; some reduced awareness evident on R side. Step ups required some assist to ensure good R knee stability. Patient reported understanding of all edu provided and without complaints at end of session.    OBJECTIVE IMPAIRMENTS Abnormal gait, decreased activity tolerance, decreased balance, decreased coordination, decreased knowledge of use of DME, decreased mobility, difficulty walking, decreased strength, impaired sensation, impaired tone, impaired UE functional use, and improper body mechanics.   ACTIVITY LIMITATIONS carrying, lifting, bending, standing, squatting, stairs, transfers, bathing, toileting, dressing, reach over head, and locomotion level  PARTICIPATION LIMITATIONS: meal prep, cleaning, laundry, interpersonal relationship, driving, shopping, community activity, occupation, yard work, and leisure activities  [place] Time since onset of injury/illness/exacerbation are also  affecting patient's functional outcome.   REHAB POTENTIAL: Excellent  CLINICAL DECISION MAKING: Evolving/moderate complexity  EVALUATION COMPLEXITY: Moderate  PLAN: PT FREQUENCY: 1-2/wk  PT DURATION: 12 weeks  PLANNED INTERVENTIONS: Therapeutic exercises, Therapeutic activity, Neuromuscular re-education, Balance training, Gait training, Patient/Family education, Self Care, Joint mobilization, Stair training, Vestibular training, Canalith repositioning, Orthotic/Fit training, DME instructions, Aquatic Therapy, Dry Needling, Electrical stimulation, Spinal mobilization, Cryotherapy, Moist heat, Splintting, Taping, and Manual therapy  PLAN FOR NEXT SESSION: assist with platform RW to promote fast, continuous steps, quad cane gait training, hip ABD strengthening    Janene Harvey, PT, DPT 10/08/22 10:17 AM  [place] at [facility] [address], [place], [place] [address] Phone # [phone] Fax # [phone]

## 2022-10-08 ENCOUNTER — Ambulatory Visit: Payer: 59 | Admitting: Physical Therapy

## 2022-10-08 ENCOUNTER — Ambulatory Visit: Payer: 59

## 2022-10-08 ENCOUNTER — Ambulatory Visit: Payer: 59 | Admitting: Occupational Therapy

## 2022-10-08 ENCOUNTER — Ambulatory Visit: Payer: 59 | Attending: Physical Medicine & Rehabilitation

## 2022-10-08 ENCOUNTER — Encounter: Payer: Self-pay | Admitting: Physical Therapy

## 2022-10-08 DIAGNOSIS — M6281 Muscle weakness (generalized): Secondary | ICD-10-CM

## 2022-10-08 DIAGNOSIS — R278 Other lack of coordination: Secondary | ICD-10-CM

## 2022-10-08 DIAGNOSIS — R2689 Other abnormalities of gait and mobility: Secondary | ICD-10-CM | POA: Diagnosis present

## 2022-10-08 DIAGNOSIS — I69351 Hemiplegia and hemiparesis following cerebral infarction affecting right dominant side: Secondary | ICD-10-CM | POA: Insufficient documentation

## 2022-10-08 DIAGNOSIS — R208 Other disturbances of skin sensation: Secondary | ICD-10-CM

## 2022-10-08 DIAGNOSIS — R262 Difficulty in walking, not elsewhere classified: Secondary | ICD-10-CM | POA: Insufficient documentation

## 2022-10-08 DIAGNOSIS — R2681 Unsteadiness on feet: Secondary | ICD-10-CM | POA: Insufficient documentation

## 2022-10-08 DIAGNOSIS — R482 Apraxia: Secondary | ICD-10-CM | POA: Diagnosis present

## 2022-10-08 DIAGNOSIS — R4701 Aphasia: Secondary | ICD-10-CM

## 2022-10-08 NOTE — Therapy (Signed)
OUTPATIENT SPEECH LANGUAGE PATHOLOGY TREATMENT   Patient Name: Gordon Fisher MRN: [id number] DOB:07-17-1961, 61 y.o., male Today's Date: 10/08/2022  PCP: TBD REFERRING PROVIDER: Jennye Boroughs, MD    End of Session - 10/08/22 1111     Visit Number 11    Number of Visits 25    Date for SLP Re-Evaluation 10/24/22    Authorization - Visit Number 11    Authorization - Number of Visits 20    SLP Start Time 1017    SLP Stop Time  1100    SLP Time Calculation (min) 43 min    Activity Tolerance Patient tolerated treatment well                   Past Medical History:  Diagnosis Date   Stroke [facility])    Past Surgical History:  Procedure Laterality Date   BUBBLE STUDY  02/15/2022   Procedure: BUBBLE STUDY;  Surgeon: Werner Lean, MD;  Location: [place];  Service: Cardiovascular;;   IR CT HEAD LTD  02/11/2022   IR CT HEAD LTD  02/11/2022   IR CT HEAD LTD  02/11/2022   IR PERCUTANEOUS ART THROMBECTOMY/INFUSION INTRACRANIAL INC DIAG ANGIO  02/11/2022   IR PERCUTANEOUS ART THROMBECTOMY/INFUSION INTRACRANIAL INC DIAG ANGIO  02/11/2022   IR US GUIDE VASC ACCESS RIGHT  02/11/2022   IR US GUIDE VASC ACCESS RIGHT  02/11/2022   RADIOLOGY WITH ANESTHESIA N/A 02/11/2022   Procedure: IR WITH ANESTHESIA;  Surgeon: Radiologist, Medication, MD;  Location: [place];  Service: Radiology;  Laterality: N/A;   RADIOLOGY WITH ANESTHESIA N/A 02/11/2022   Procedure: RADIOLOGY WITH ANESTHESIA;  Surgeon: Radiologist, Medication, MD;  Location: [place];  Service: Radiology;  Laterality: N/A;   TEE WITHOUT CARDIOVERSION N/A 02/15/2022   Procedure: TRANSESOPHAGEAL ECHOCARDIOGRAM (TEE);  Surgeon: Werner Lean, MD;  Location: [facility] END[facility]COPY;  Service: Cardiovascular;  Laterality: N/A;   Patient Active Problem List   Diagnosis Date Noted   Hypocalcemia 03/22/2022   DVT, lower extremity, distal ([place]) 03/22/2022   Obesity 03/22/2022   Elevated LDL cholesterol level 03/22/2022   Acute  ischemic left middle cerebral artery (MCA) stroke ([place]) 02/18/2022   Cerebrovascular accident (CVA) due to occlusion of left middle cerebral artery ([place]) 02/11/2022    ONSET DATE: 02-11-22   REFERRING DIAG: I63.9 (ICD-10-CM) - Cerebrovascular accident (CVA), unspecified mechanism ([place]) R13.10 (ICD-10-CM) - Dysphagia, unspecified type   THERAPY DIAG:  Verbal apraxia  Aphasia  Rationale for Evaluation and Treatment Rehabilitation  SUBJECTIVE:   SUBJECTIVE STATEMENT: "Hahm." (When seeing icon and lablel "ham") Pt accompanied by: self  PERTINENT HISTORY: Gordon Fisher. [name] is a 61 year old male in relatively good health who was admitted via [facility] on 02/11/22 with right sided weakness, right facial droop and slurred speech. CTH showed L-MCA hyperdense sign and he received TNK prior to transfer. CTA showed L-MCA MI occlusion and he underwent cerebral angio with thrombectomy and TICI 3 flow by Dr. Nelida Gores [name].  Follow up MRI brain done revealing small areas of acute infarct infarct in L-MCA territory with mild hemorrhagic transformation in left parietal lobe and L-MCA bifurcation residual vascular thrombus. He was taken back for thrombectomy with stent for dissection but had intra-stent occlusion which was unable to be recanalized. Post op CT showed small SAH which was stable on follow up. He was started on ASA/Brillinta. MBS 04/18 showed oral dysphagia with oral delay and no penetration or aspiration. He was started on D3, thins with supervision. Repeat MRI  brain on 04/16 showed extensive evolving acute ischemic L-MCA occlusion increased in size with associated edema, few additional small acute cortical subcortical infarcts involving contralateral right frontal lobe and left ACA distribution. Stroke felt to be due to unknown etiology and wife elected on Zio patch on discharge to rule out A fib. 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 Gordon Fisher was d/[facility]'d from [facility] on 03-[facility]-22, and has had HHST since that time until last week.   PAIN:  Are you having pain? No  PATIENT GOALS Pt indicated he would like talking to improve  OBJECTIVE:  DIAGNOSTIC FINDINGS: MRI without contrast 02/13/22 IMPRESSION: 1. Extensive evolving acute ischemic left MCA distribution infarct, increased in size as compared to previous MRI from 02/11/2022. Associated edema without significant midline shift. 2. Small volume acute subarachnoid hemorrhage at the posterior aspect of the left Sylvian fissure, stable. 3. Few additional small volume acute ischemic cortical and subcortical infarcts involving the contralateral right frontal lobe and left ACA distribution as above. 4. Underlying chronic microvascular ischemic disease.    PATIENT REPORTED OUTCOME MEASURES (PROM): Communication Effectiveness Survey: Pt scored 8/32 - higher scores indicate more effective communication/QOL.   TODAY'S TREATMENT:  10-08-22: Lingraphica start trial date today. SLP introduced device to pt/wife. Pt with functional verbalization within 3 seconds of initiation for "ham", "tomato", "cheese", "[name]", and "sandwich". Pt produced "sandwich" spontaneously after 8 seconds and mutliple attempts, without label. SLP introduced how to move, add, and delete icons. Pt communicated his sandwich choice ("sandwich meat" originally, and then "ham" after it was added to page), and toppings choices (tomato, cheese) with consistent min-mod cues, faded to occasional min-mod cues. Pt and wife to use device over the weekend for food - - SLP to assist/teach pt and Gordon Fisher how to add pictures to icons for family members next session.  09/27/22: Lingraphica not yet sent from [facility]. Should be here by pt's next appointment.  Gordon Fisher told SLP names of people at their Thanksgiving celebration, with usual mod to max assist. Visual cues were most effective on pt's  most difficult words. SLP assessed pt's reading comprehension - 80% with 4-5 words.   11/8/23Laretta Fisher stated they had been busy this week with appointments but still work at practicing some. SLP initiated a 20-minute discussion today with Evette Doffing, and SLP explaining speech generating device in depth, showing Gordon Fisher and Gordon Fisher options for trial device and deciding on the Lingraphica Touch Talk. SLP to make referral for this in the next few days.  Family names were written with mod-max A usually - pt got first 3 letters correct with childrens and grandchildrens' names. SLP showed Gordon Fisher how to break up daughter's name to [id number] to encourage brain's processing in a different way in order to foster new neural pathways to say the name.  09/06/22: SLP targeted verbal expression with picture naming and divergent naming. Pt notably demonstrates he knows the word/s he wants to say but is finding it difficult to pronounce them (verbal apraxia). Pt's expression improves with visual cues (oral cues from SLP for initial phoneme/sound) - 75% successful even without sound necessary - just visual cue for articulator placement.   09/01/22: SLP worked with pt's verbal expression and auditory comprehension today. Simple conversation/sentences were understood 90% of the time, 100% with one repeat by speaker. With opposites/paired words, pt had 70% success, improved to 90% with min A. With cloze phrases/sentences pt's response accuracy improved with  1 syllable words, but overall success was 17% with min A, improved to 50% with mod-max A. Often, pt knew the response but knew he was going to misarticulate the response. Written cues were helpful for pt here. SLP encouraged wife to cont with cloze phrases and paired words at home.  10/25/23Laretta Fisher brought pt's cards today and SLP reviewed them with pt, modleing to wife cueing methods and hierarchy.Pt functionally said 6/7 salient objects (without written words) with mod-max cues,  and 1/7 spontaneously. He said family names with written cues 6/7 (functionally), and 1/7 wth max cues Janett Billow"). Pt demo'd understnading of directions in context 75% today. SLP introduce possibility of Lingraphica to pt and wife and educated about studies proving that AAC assists verbal communication and that pt may not have to have assistance with a AAC device but we will know more as time progresses.   08/23/22: SLP targeted verbal expression with pt today. Heavy perseveration with "[name]" grandson's name for all J names. SLP even did months of the year and pt cont to have perseveration with [name] for all J names. "[name]" - pt cont to make this into two syllables 90% of the time, with consistent max A. Pt used multi-modal communization using chicken clucks with SLP assistance and encouragement when pt could not say "chickens", and pointed to ages of his grandchildren with SLP writing numbers 1-10. Mod-max A needed for grandson 2 months old. Pt showed 1 and then gestured taking a short person's height for SLP to know young child. MAX A needed for month names > 1 syllable. With one syllable months pt 80% success. Pt told SLP name of his truck, and functionally told SLP name of one of his two tractors. Req'd mod/max A otherwise. Pt to cont to practice salient/high frequency/high importance words.   08/18/22: SLP targeted spelling today with pt - 3 letter words 100%, 4-letter words 80% - [name] stated pt had s/sx dyslexia prior to CVA. SLP to monitor this. SLP provided some other hints/helpful suggestions for home practice and communication at home today. SLP noted pt read 3 and 4 letter words 50% accuracy when spelled correctly.   08/16/22: SLP assessed pt's reading comprehension - pt reading single words 6/6 and simple sentences of 6-9 words ("the boy is riding his bike in the park") 6/6. [name] said pt having difficulty reading newspaper from home. SLP did reading task (newspaper) with pt today. SLP  highlighted key words for pt and he answered 4/6 questions correct by pt answering yes/no and pointing to correct portion in the article to indicate place where the answer was found. Pt will return CES next session or two.    PATIENT EDUCATION: Education details: see "Today's therapy" Person educated: Patient and Spouse Education method: Explanation and Demonstration Education comprehension: verbalized understanding and needs further education   GOALS: Goals reviewed with patient? Yes, in general  SHORT TERM GOALS: Target date: 08/31/2022 extended one week due to visits  Pt will name 6 salient family names when given picture and written name with consistent mod-max cues in 3 sessions (precursor for speech generating device) Baseline:08-25-22. 09-01-22 Goal status: partially met  2.  Pt will produce simple phoneme/ bilabial+/a/  /a/, /ba/, /ma/, and/or /pa/ simultaneously with SLP 65% success (equal # reps/stimulus) in 2 sessions Baseline:  Goal status: deferred - pt to focus on salient items/terms  3.  Pt will demo understanding of simple commands in context 80% over three sessions Baseline: 08-25-22, 09-01-22 Goal status: Partially met  4.  Pt will indicate understanding of a complex sentence 85% of the time in 3 sessions (precursor for speech generating device) Baseline:  Goal status: not met  5.  Pt will copy written family names with 50% success in two sessions  Baseline:  Goal Status: met   LONG TERM GOALS: Target date: 10/24/2022  (or 20th visit)  Pt will name 6 salient family names when given picture and written name with consistent mod cues in 3 sessions (precursor for speech generating device) Baseline:  Goal status: Ongoing  2.  Pt will IMITATE simple phoneme/bilabial+/a/  /a/, /ba/, /ma/, and/or /pa/  65% success (equal # reps/stimulus) in 2 sessions Baseline:  Goal status: deferred - working with pt on more salient words  3.  Pt will generate meaningful  message understood by [name] using speech generating device 60% of the time in 3 sessions Baseline:  Goal status: Ongoing  4.  [name] will indicate that spontaneous communication is easier than during week of evaluation Baseline:  Goal status: Ongoing   ASSESSMENT:  CLINICAL IMPRESSION: He is a 61 y.o. male who cont to be seen today for treatment of verbal apraxia (as pt often indicates he knows the response but knows it will not be articulated correctly) and aphasia. Lingraphica trial began today. SEE TX NOTE from today for more details.   OBJECTIVE IMPAIRMENTS include expressive language, receptive language, and aphasia. These impairments are limiting patient from return to work, managing medications, managing appointments, household responsibilities, ADLs/IADLs, and effectively communicating at home and in community. Factors affecting potential to achieve goals and functional outcome are ability to learn/carryover information and severity of impairments. Patient will benefit from skilled SLP services to address above impairments and improve overall function.  REHAB POTENTIAL: Good  PLAN: SLP FREQUENCY: 2x/week  SLP DURATION: 12 weeks  PLANNED INTERVENTIONS: Language facilitation, Environmental controls, Cueing hierachy, Internal/external aids, Functional tasks, and Multimodal communication approach    Amilliana Hayworth, CCC-SLP 10/08/2022, 11:11 AM

## 2022-10-08 NOTE — Therapy (Signed)
OUTPATIENT OCCUPATIONAL THERAPY NEURO TREATMENT NOTE  Patient Name: Gordon Fisher MRN: [id number] DOB:1961/10/03, 61 y.o., male Today's Date: 10/08/2022  PCP: none on file REFERRING PROVIDER: Fanny Dance, MD    OT End of Session - 10/08/22 0944     Visit Number 16    Number of Visits 30    Date for OT Re-Evaluation 11/19/22    Authorization Type [employer]    Authorization Time Period VL: 60 combined (OT/PT/ST)    Authorization - Number of Visits 20    OT Start Time 0847    OT Stop Time 0932    OT Time Calculation (min) 45 min    Activity Tolerance Patient tolerated treatment well    Behavior During Therapy [facility] for tasks assessed/performed                    Past Medical History:  Diagnosis Date   Stroke [facility])    Past Surgical History:  Procedure Laterality Date   BUBBLE STUDY  02/15/2022   Procedure: BUBBLE STUDY;  Surgeon: Christell Constant, MD;  Location: [facility] ENDOSCOPY;  Service: Cardiovascular;;   IR CT HEAD LTD  02/11/2022   IR CT HEAD LTD  02/11/2022   IR CT HEAD LTD  02/11/2022   IR PERCUTANEOUS ART THROMBECTOMY/INFUSION INTRACRANIAL INC DIAG ANGIO  02/11/2022   IR PERCUTANEOUS ART THROMBECTOMY/INFUSION INTRACRANIAL INC DIAG ANGIO  02/11/2022   IR US GUIDE VASC ACCESS RIGHT  02/11/2022   IR US GUIDE VASC ACCESS RIGHT  02/11/2022   RADIOLOGY WITH ANESTHESIA N/A 02/11/2022   Procedure: IR WITH ANESTHESIA;  Surgeon: Radiologist, Medication, MD;  Location: [facility] OR;  Service: Radiology;  Laterality: N/A;   RADIOLOGY WITH ANESTHESIA N/A 02/11/2022   Procedure: RADIOLOGY WITH ANESTHESIA;  Surgeon: Radiologist, Medication, MD;  Location: [facility] OR;  Service: Radiology;  Laterality: N/A;   TEE WITHOUT CARDIOVERSION N/A 02/15/2022   Procedure: TRANSESOPHAGEAL ECHOCARDIOGRAM (TEE);  Surgeon: Christell Constant, MD;  Location: [facility] ENDOSCOPY;  Service: Cardiovascular;  Laterality: N/A;   Patient Active Problem List   Diagnosis Date Noted   Hypocalcemia  03/22/2022   DVT, lower extremity, distal (HCC) 03/22/2022   Obesity 03/22/2022   Elevated LDL cholesterol level 03/22/2022   Acute ischemic left middle cerebral artery (MCA) stroke (HCC) 02/18/2022   Cerebrovascular accident (CVA) due to occlusion of left middle cerebral artery (HCC) 02/11/2022    ONSET DATE: referral 07/15/22 (CVA 02/11/22)  REFERRING DIAG: I63.9 (ICD-10-CM) - Cerebrovascular accident (CVA), unspecified mechanism (HCC) R13.10 (ICD-10-CM) - Dysphagia, unspecified type   THERAPY DIAG:  Hemiplegia and hemiparesis following cerebral infarction affecting right dominant side (HCC)  Other lack of coordination  Other disturbances of skin sensation  Muscle weakness (generalized)  Other abnormalities of gait and mobility  Rationale for Evaluation and Treatment Rehabilitation  SUBJECTIVE:   SUBJECTIVE STATEMENT: Pt's spouse reports that he seems to be a little slower (with processing) this morning.   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 No  FALLS: Has patient fallen in last 6 months? Yes. Number of falls 1  PLOF: Independent, Independent with basic ADLs, and Independent with gait  PATIENT GOALS: continue to make gains, independence with getting dressed   OBJECTIVE:   HAND DOMINANCE: Right  FUNCTIONAL OUTCOME MEASURES: FOTO: 11  UPPER EXTREMITY ROM    Active ROM: Pt with  no AROM during evaluation.  Pt was able to elicit min shoulder elevation and slight gross grasp when cued and provided increased time to initiate movement.   09/22/22: Pt still with no AROM in upright position, is able to elicit slight internal rotation and mod elbow flexion/extension in gravity assisted position.  Passive ROM Right Eval - 9/25  Shoulder flexion 100  Shoulder abduction   Shoulder adduction   Shoulder  extension   Shoulder internal rotation WNL  Shoulder external rotation onset of pain >5-10* from neutral  Middle trapezius   Lower trapezius   Elbow flexion WNL  Elbow extension WNL  Wrist flexion WNL  Wrist extension WNL  Wrist ulnar deviation   Wrist radial deviation   Wrist pronation   Wrist supination   (Blank rows = not tested)  HAND FUNCTION: No functional grasp. Trace gross grasp with increased time for initiation  SENSATION: Difficult to assess due to expressive aphasia  MUSCLE TONE: RUE: Mild and Hypertonic  ------------------------------------------------------------------------------------------------------------------------------------------------------------ (objective measures above completed at initial evaluation unless otherwise dated)  TODAY'S TREATMENT:  10/08/22 Reviewed precautions and parameters for use of NMES.  Protocol set up on [place] system, however no stimulation produced from machine, therefore terminated task with plan to trouble shoot and attempt at future session. AAROM: shoulder shrugs and scapular retraction completed sitting in front of mirror for visual feedback.  Therapist rolled up shirt sleeve to allow pt to visualize R shoulder with movements.  Pt demonstrating min elevation. AAROM: closed chain movements with dowel and use of mirror for visual feedback. Completed chest press and shoulder flexion with dowel for improved symmetry and activation of RUE. OT wrapped RUE with coban to dowel to maintain grasp.  OT educated pt and spouse on positioning and to maintain good shoulder approximation during exercises and not let UE "hang".   09/22/22 ADL: pt is able to don shirt Supervision approx 75% of time.  Spouse reports that sometimes they are in a hurry, or he is too fatigued and she then assists him.  She reports that sometimes he may get stuck getting shirt pulled down torso or over shoulder.  Pt demonstrating donning/doffing pull over shirt  with min cues for technique to ensure advancing shirt over elbow prior to donning shirt overhead and cues to adjust at shoulder. Kinesiotape: OT educated pt and spouse on purpose of kinesiotaping for increased positioning s/p shoulder subluxation.  OT applied kinesiotape to shoulder and at scapula for improved positioning and support. LB dressing: completed simulated LB dressing with donning/doffing pants with gait belt.  Pt able to complete with supervision, however still with no use of RUE even as stabilizer during clothing management.     09/20/22 PROM: shoulder flexion/extension, abduction/adduction, and internal/external rotation with therapist providing support at elbow to minimize pain/effects d/t subluxation.  PROM of wrist flexion/extension and ulnar/radial deviation.  Pt reports pain x1 with external rotation, however no further complaints with any movements or repetition of external rotation.  WB: through full RUE in extension with hand placed over therapist's thigh to allow OT to further facilitate WB with direction changes.  Pt reaching across midline to reach for rings to further facilitate WB.  OT then repositioned pt's hand to flat on mat table with tactile cues/facilitation at elbow and shoulder for increased WB. Attempting shoulder activation post WB, however pt still with trace movement and requiring increased internal rotation and compensatory trunk movements to facilitate even the trace movement.   UE Ranger: forward shoulder flexion/extension and  horizontal abduction/adduction with OT providing support at elbow to facilitate increased alignment and shoulder positioning due to subluxation.  Pt with increased difficulty with flexion, utilizing compensatory trunk movements to facilitate increased forward ROM.   PATIENT EDUCATION: Ongoing condition-specific education related to therapeutic interventions completed this session, discussed parameters with use of NMES Person educated:  Patient and Spouse Education method: Explanation, Demonstration, Tactile cues, Verbal cues, and Handouts Education comprehension: verbalized understanding and needs further education   HOME EXERCISE PROGRAM: Written exercises of shoulder and elbow in supine with cues for hand placement and facilitation.   Self[facility]ROM handout from [facility]  [employer] Access Code: [email or web address] URL: [email or web address] - Supine Shoulder Flexion AAROM with Hands Clasped  - 1 x daily - 1 sets - 10 reps - Seated Elbow Extension and Shoulder External Rotation AAROM at Table with Towel  - 1 x daily - 1 sets - 10 reps - Finger Extension with Wrist Extension Caregiver PROM  - 1 x daily - 1 sets - 10 reps  GOALS: Goals reviewed with patient? Yes  SHORT TERM GOALS:  Target date: 10/22/22 Pt and spouse will be independent with advanced PROM and self-ROM HEP for shoulder stability and ROM. Baseline: Goal status: IN Progress  2.  Pt and spouse will verbalize understanding of AE and/or task modifications as needed for fastening shoes.  Baseline:  Goal status: IN Progress   LONG TERM GOALS: Target date 11/19/22  Patient will be able to incorporate RUE into UB/LB dressing task at stabilizer to gross assist level. Baseline: Goal status: IN Progress  2.   Pt will utilize RUE as gross assist during grooming tasks (such as washing hands and face). Baseline:  Goal status: IN Progress  3.   Pt and spouse will verbalize and/or  demonstrate understanding of advanced treatment strategies and implementation at home (NMES, WB) to facilitate increased motor recovery.  Baseline:  Goal status: IN Progress  4.   Pt will demonstrate ability to utilize BUE during mobility tasks to pushup from chair with BUE on arm rests.  Baseline:  Goal status: IN Progress  5.   Pt will demonstrate ability to don/doff footwear (to include socks and shoes) with supervision and use of AE PRN.  Baseline:  Goal status: IN  Progress   ASSESSMENT:  CLINICAL IMPRESSION: Treatment session with focus on NMR with RUE.  Pt demonstrating min elevation and scapular retraction, however benefits from use of mirror and tapping for increased mobility.  Pt continues to demonstrate difficulty with shoulder AROM, frequently demonstrating compensatory strategies of weight shifting, forward lean, and internal rotation.  R shoulder subluxation likely contributing to limitations. Plan to begin implementing NMES during future session as cleared by cardiologist Dr. Steffanie Dunnameron Lambert.  PERFORMANCE DEFICITS in functional skills including ADLs, IADLs, coordination, sensation, tone, ROM, strength, pain, flexibility, FMC, GMC, mobility, balance, body mechanics, vision, and UE functional use and psychosocial skills including environmental adaptation and routines and behaviors.   IMPAIRMENTS are limiting patient from ADLs and IADLs.   COMORBIDITIES may have co-morbidities  that affects occupational performance. Patient will benefit from skilled OT to address above impairments and improve overall function.   PLAN: OT FREQUENCY: 1-2x/week  OT DURATION: 8 weeks  PLANNED INTERVENTIONS: self care/ADL training, therapeutic exercise, therapeutic activity, neuromuscular re-education, manual therapy, passive range of motion, balance training, functional mobility training, splinting, electrical stimulation, compression bandaging, moist heat, cryotherapy, patient/family education, visual/perceptual remediation/compensation, psychosocial skills training, energy conservation, coping strategies training, and DME and/or AE instructions  RECOMMENDED  OTHER SERVICES: N/A  CONSULTED AND AGREED WITH PLAN OF CARE: Patient and family member/caregiver  PLAN FOR NEXT SESSION: Continue w/ scapular stability: shoulder shrugs, rolls, and scapular retraction; WB and dynamic standing, supine and sidelying NMR.  E-stim as cleared by MD.   Rosalio Loud,  OTR/L 10/08/2022, 9:44 AM

## 2022-10-11 ENCOUNTER — Ambulatory Visit: Payer: 59

## 2022-10-11 ENCOUNTER — Ambulatory Visit (INDEPENDENT_AMBULATORY_CARE_PROVIDER_SITE_OTHER): Payer: 59

## 2022-10-11 ENCOUNTER — Ambulatory Visit: Payer: 59 | Admitting: Occupational Therapy

## 2022-10-11 DIAGNOSIS — M6281 Muscle weakness (generalized): Secondary | ICD-10-CM

## 2022-10-11 DIAGNOSIS — R482 Apraxia: Secondary | ICD-10-CM

## 2022-10-11 DIAGNOSIS — R262 Difficulty in walking, not elsewhere classified: Secondary | ICD-10-CM

## 2022-10-11 DIAGNOSIS — R208 Other disturbances of skin sensation: Secondary | ICD-10-CM

## 2022-10-11 DIAGNOSIS — I639 Cerebral infarction, unspecified: Secondary | ICD-10-CM

## 2022-10-11 DIAGNOSIS — R2689 Other abnormalities of gait and mobility: Secondary | ICD-10-CM

## 2022-10-11 DIAGNOSIS — R278 Other lack of coordination: Secondary | ICD-10-CM

## 2022-10-11 DIAGNOSIS — R4701 Aphasia: Secondary | ICD-10-CM

## 2022-10-11 DIAGNOSIS — I69351 Hemiplegia and hemiparesis following cerebral infarction affecting right dominant side: Secondary | ICD-10-CM

## 2022-10-11 DIAGNOSIS — R2681 Unsteadiness on feet: Secondary | ICD-10-CM

## 2022-10-11 NOTE — Therapy (Signed)
OUTPATIENT PHYSICAL THERAPY NEURO TREATMENT   Patient Name: Gordon Fisher MRN: [id number] DOB:May 07, 1961, 61 y.o., male Today's Date: 10/11/2022   PCP: None listed REFERRING PROVIDER: Jennye Boroughs, MD    PT End of Session - 10/11/22 1055     Visit Number 18    Number of Visits 40    Date for PT Re-Evaluation 12/13/22    Authorization Type [employer] [id number]    Authorization - Visit Number 70    Authorization - Number of Visits 20    Progress Note Due on Visit 10    PT Start Time 1100    PT Stop Time 1145    PT Time Calculation (min) 45 min    Equipment Utilized During Treatment Gait belt    Activity Tolerance Patient tolerated treatment well    Behavior During Therapy WFL for tasks assessed/performed                     Past Medical History:  Diagnosis Date   Stroke [facility])    Past Surgical History:  Procedure Laterality Date   BUBBLE STUDY  02/15/2022   Procedure: BUBBLE STUDY;  Surgeon: Werner Lean, MD;  Location: [place];  Service: Cardiovascular;;   IR CT HEAD LTD  02/11/2022   IR CT HEAD LTD  02/11/2022   IR CT HEAD LTD  02/11/2022   IR PERCUTANEOUS ART THROMBECTOMY/INFUSION INTRACRANIAL INC DIAG ANGIO  02/11/2022   IR PERCUTANEOUS ART THROMBECTOMY/INFUSION INTRACRANIAL INC DIAG ANGIO  02/11/2022   IR US GUIDE VASC ACCESS RIGHT  02/11/2022   IR US GUIDE VASC ACCESS RIGHT  02/11/2022   RADIOLOGY WITH ANESTHESIA N/A 02/11/2022   Procedure: IR WITH ANESTHESIA;  Surgeon: Radiologist, Medication, MD;  Location: [place];  Service: Radiology;  Laterality: N/A;   RADIOLOGY WITH ANESTHESIA N/A 02/11/2022   Procedure: RADIOLOGY WITH ANESTHESIA;  Surgeon: Radiologist, Medication, MD;  Location: [place];  Service: Radiology;  Laterality: N/A;   TEE WITHOUT CARDIOVERSION N/A 02/15/2022   Procedure: TRANSESOPHAGEAL ECHOCARDIOGRAM (TEE);  Surgeon: Werner Lean, MD;  Location: [facility] ENDOSCOPY;  Service: Cardiovascular;  Laterality: N/A;   Patient Active  Problem List   Diagnosis Date Noted   Hypocalcemia 03/22/2022   DVT, lower extremity, distal ([place]) 03/22/2022   Obesity 03/22/2022   Elevated LDL cholesterol level 03/22/2022   Acute ischemic left middle cerebral artery (MCA) stroke ([place]) 02/18/2022   Cerebrovascular accident (CVA) due to occlusion of left middle cerebral artery ([place]) 02/11/2022    ONSET DATE: April 2023  REFERRING DIAG: I63.9 (ICD-10-CM) - Cerebrovascular accident (CVA), unspecified mechanism ([place]) R13.10 (ICD-10-CM) - Dysphagia, unspecified type   THERAPY DIAG:  Hemiplegia and hemiparesis following cerebral infarction affecting right dominant side (HCC)  Muscle weakness (generalized)  Other abnormalities of gait and mobility  Unsteadiness on feet  Difficulty in walking, not elsewhere classified  Rationale for Evaluation and Treatment Rehabilitation  SUBJECTIVE:  SUBJECTIVE STATEMENT: Feeling better, walking more at home with brace Pt accompanied by: significant other  PERTINENT HISTORY: 02/11/2022 patient developed right-sided weakness, aphasia, went to hospital for evaluation.  Code stroke was activated.  He received TNK and then transferred to [facility].  He underwent cerebral angiogram with thrombectomy.  Unfortunately vessel reoccluded later the day and the second thrombectomy was attempted.  Left M2 dissection was identified and treated with stent but unfortunately stent demonstrated reocclusion   PAIN:  Are you having pain? No  PRECAUTIONS: Shoulder and Fall, Aphasia   WEIGHT BEARING RESTRICTIONS No  FALLS: Has patient fallen in last 6 months? Yes. Number of falls 1, required 3-assist to recover  LIVING ENVIRONMENT: Lives with: lives with their spouse Lives in: House/apartment Stairs: Yes: Internal:  flight steps; bilateral but cannot reach both and External: 4-5 steps; ramp is also present.  Has following equipment at home: Hemi walker, Wheelchair (manual), and sit-stand lift  PLOF: Independent  PATIENT GOALS regain independence  OBJECTIVE:    TODAY'S TREATMENT: 10/11/22 Activity Comments  Pre-gait Activities to promote right hip flexion for step height/limb advancement  Stair training -step ups 3x5 with RLE--quad facilitation for LLE advancement -trials in reciprocal stair ambulation with CGA-min A for RLE facilitation and cues in sequence  Gait training -NBQC and CGA level surfaces 2x50 ft with cues in larger step length -right platform RW and CGA-min A to promote larger step length and continuous steps with tapered feedback/assist level to promote carryover  Retro-walking Min A to facilitate RLE hip extension and terminal knee extension 3x40 ft  Mat table exercise -supine bridge with gait belt around knees to promote hip abd recruitment 3x10 -hip add isometric 3x10 -right heel slides 3x10 with min A -bent knee fallouts RLE requiring bracing for support/isolation -SLR 3x5 RLE with cues for core brace and limb isolation        TODAY'S TREATMENT: 10/08/22 Activity Comments  Nustep L1 x 4 min For reciprocal movement pattern and increased R LE use; cueing to avoid excessive R hip ER  gait with quad cane Working on timing of R foot step-was slower today  Figure 8 turns with quad cane, sharp pivot turns Cueing to "march steps" with turn  Forward/back stepping over 1/2 foam roll 5x R, 5x L, 5x L over hurdle Cueing to bring R knee to chest upon stepping over obstacle, and shifting onto stabilizing R LE upon stepping with L foot   Stair navigation 2x L foot dominant pattern; fairly good stability with 1 UE  R step ups 2x10 With PT supporting R knee to avoid buckling and recurvatum  R foot tap on 6" step 2x5 PT providing mod A to help lift leg; able to perform a few times independently         [place] Last updated: 09/15/22 Access Code: [email or web address] URL: [email or web address] Date: 09/15/2022 Prepared by: [facility]  Exercises - Forward Backward Weight Shift with Counter Support  - 1 x daily - 5 x weekly - 2 sets - 10 reps - Side to Side Weight Shift with Counter Support  - 1 x daily - 5 x weekly - 2 sets - 10 reps - Mini Squat with Counter Support  - 1 x daily - 5 x weekly - 2 sets - 10 reps - Stride Stance Weight Shift  - 1 x daily - 5 x weekly - 2 sets - 10 reps - Sit to stand in  stride stance  - 2 x daily - 7 x weekly - 1-2 sets - 5 reps - Supine Bridge with Mini Swiss Ball Between Knees  - 1 x daily - 5 x weekly - 2 sets - 10 reps - Supine Heel Slide  - 1 x daily - 5 x weekly - 2 sets - 10 reps - Seated Isometric Hip Adduction with Ball  - 1 x daily - 5 x weekly - 2 sets - 10 reps - 3 sec hold    Below measures were taken at time of initial evaluation unless otherwise specified:     DIAGNOSTIC FINDINGS: Follow-up MRI brain done revealing small areas of acute infarct in left-MCA territory with mild hemorrhagic transformation in left parietal lobe and left-MCA bifurcation residual vascular thrombus   COGNITION: Overall cognitive status:  Difficult to assess due to aphasia   SENSATION: Light touch: Impaired  Proprioception: Impaired   COORDINATION: RLE impaired, some deficits on left foot with alternating movements and isolated movements  EDEMA:  None   MUSCLE TONE: RLE: Modifed Ashworth Scale 2 = More marked increase in muscle tone through most of the ROM, but affected part(s) easily moved right quad most affected, no clonus right plantarflexors   MUSCLE LENGTH: Able to achieve full right knee extension to PROM  DTRs:  Patella 3+ = Brisk  POSTURE: No Significant postural limitations  LOWER EXTREMITY ROM:     LLE WNL, RLE AROM limited by weakness  LOWER EXTREMITY MMT:    MMT  Right Eval Left Eval  Hip flexion 3- 5  Hip extension    Hip abduction 3- 4  Hip adduction 3- 5  Hip internal rotation    Hip external rotation    Knee flexion 3 5  Knee extension 2+ 5  Ankle dorsiflexion 0 5  Ankle plantarflexion 1 3+  Ankle inversion    Ankle eversion    (Blank rows = not tested)  BED MOBILITY:  NT  TRANSFERS: Assistive device utilized: Hemi walker  Sit to stand: Modified independence Stand to sit: SBA Chair to chair: SBA and CGA Floor:  Total assist  RAMP:  Level of Assistance: CGA Assistive device utilized: Hemi walker Ramp Comments:   CURB:  Level of Assistance: Min A Assistive device utilized: Nutritional therapist Comments: cues in sequence  STAIRS:  Level of Assistance: CGA and Min A  Stair Negotiation Technique: Step to Pattern with Single Rail on Left  Number of Stairs: 10   Height of Stairs: 4-6"  Comments: cues and guarding  GAIT: Gait pattern: step to pattern, decreased ankle dorsiflexion- Right, and circumduction- Right Distance walked: 82 Assistive device utilized: Hemi walker Level of assistance: CGA Comments: deficits during turning  FUNCTIONAL TESTs:  5 times sit to stand: 11.59 sec with 3 retro LOB and poor eccentric control Timed up and go (TUG): 34.75 sec Berg Balance Scale: 32/56 Dynamic Gait Index: NT 2 minute walk test: 82 ft w/ hemi-walker, 0.68 ft/sec  M-CTSIB  Condition 1: Firm Surface, EO 30 Sec, Normal and Mild Sway  Condition 2: Firm Surface, EC 30 Sec, Mild and Moderate Sway  Condition 3: Foam Surface, EO 30 Sec, Mild Sway  Condition 4: Foam Surface, EC 26 Sec, Moderate and Severe Sway     PATIENT SURVEYS:  FOTO 47%  TODAY'S TREATMENT:  assessment   PATIENT EDUCATION: Education details: assessment findings, PT scope of practice Person educated: Patient and Spouse Education method: Explanation Education comprehension: verbalized understanding   HOME EXERCISE PROGRAM:  TBD    GOALS: Goals  reviewed with patient? Yes  SHORT TERM GOALS: Target date: 11/01/2022    Patient will perform HEP with family/caregiver supervision for improved strength, balance, transfers, and gait  Baseline: Goal status: GOAL MET, 08/23/2022  2.  Ambulate 12 stairs with left HR and supervision to improve safety within home Baseline: CGA-min A; per report CGA/supervision 6 steps; able to perform Goal status: MET  3.  Demonstrate improved safety and efficiency with ambulation per distance of 100 ft during 2MWT Baseline: 82 ft w/ HW and CGA; 111 ft 08/23/2022 Goal status: GOAL MET, 08/23/2022  4. Demonstrate improved gait speed to 1.8 ft/sec with right AFO and least restrictive AD to improve efficiency of gait  Baseline: 0.68 ft/sec at eval; (09/20/22) 1.59 ft/sec w/ platform RW and AFO  Goal status: On-going    LONG TERM GOALS: Target date: 12/13/2022    Demonstrate improved balance and reduced risk for falls per score 45/56 Berg Balance Test Baseline: 32/56; (09/20/22) 42/56 Goal status:Goal revised  2.  Demonstrate modified independent ambulation x 150 ft level surfaces using least restrictive AD in order to improve safety with home environment Baseline: CGA w/ HW; (09/20/22) supervision with HW level surfaces Goal status: IN PROGRESS  3.  Manifest improved safety with gait per time of 18 sec TUG test using least restrictive AD Baseline: 34.75 sec w/ HW; (09/20/22) 30.69 w/ HW and right AFO Goal status: IN PROGRESS   4. Stair ambulation x 15 steps with set-up assist to improve safety in home environment and reduce burden of care  Baseline: SBA with single HR, using AFO  Goal status: On-going      ASSESSMENT:  CLINICAL IMPRESSION: Improved gait pattern with training today with ability to initiate reciprocal stair ambulation with assist/facilitation to RLE for loading response only occasional assist for RLE clearing edge of step for ascending. Good response to imposed faster walking  speed with therapist assist on platform RW to assist right side to increase gait velocity and promote reciprocal steps with ability to maintain carryover with tapered physical assist. Remaining session focus on RLE motor control/strength with varying degree of physical assist and cues for recruitment and engagement with emphasis on proximal RLE control. Continued sessions to progress strength, balance, coordination, and mobility to reduce risk for falls and normalize gait pattern   OBJECTIVE IMPAIRMENTS Abnormal gait, decreased activity tolerance, decreased balance, decreased coordination, decreased knowledge of use of DME, decreased mobility, difficulty walking, decreased strength, impaired sensation, impaired tone, impaired UE functional use, and improper body mechanics.   ACTIVITY LIMITATIONS carrying, lifting, bending, standing, squatting, stairs, transfers, bathing, toileting, dressing, reach over head, and locomotion level  PARTICIPATION LIMITATIONS: meal prep, cleaning, laundry, interpersonal relationship, driving, shopping, community activity, occupation, yard work, and leisure activities  [place] Time since onset of injury/illness/exacerbation are also affecting patient's functional outcome.   REHAB POTENTIAL: Excellent  CLINICAL DECISION MAKING: Evolving/moderate complexity  EVALUATION COMPLEXITY: Moderate  PLAN: PT FREQUENCY: 1-2/wk  PT DURATION: 12 weeks  PLANNED INTERVENTIONS: Therapeutic exercises, Therapeutic activity, Neuromuscular re-education, Balance training, Gait training, Patient/Family education, Self Care, Joint mobilization, Stair training, Vestibular training, Canalith repositioning, Orthotic/Fit training, DME instructions, Aquatic Therapy, Dry Needling, Electrical stimulation, Spinal mobilization, Cryotherapy, Moist heat, Splintting, Taping, and Manual therapy  PLAN FOR NEXT SESSION: assist with platform RW to promote fast, continuous steps, quad cane gait  training, hip ABD strengthening    11:03 AM, 10/11/22 M. Sherlyn Lees, PT, DPT Physical Therapist- [place] Office Number: [phone]  [facility] [place], [place], [place] [address] Phone # [phone] Fax # [phone]

## 2022-10-11 NOTE — Therapy (Signed)
OUTPATIENT SPEECH LANGUAGE PATHOLOGY TREATMENT   Patient Name: Gordon Fisher MRN: [id number] DOB:1961-09-18, 61 y.o., male Today's Date: 10/11/2022  PCP: TBD REFERRING PROVIDER: Jennye Boroughs, MD    End of Session - 10/11/22 1257     Visit Number 12    Number of Visits 25    Date for SLP Re-Evaluation 10/24/22    Authorization - Visit Number 12    Authorization - Number of Visits 20    SLP Start Time 5248    SLP Stop Time  1102    SLP Time Calculation (min) 44 min    Activity Tolerance Patient tolerated treatment well                    Past Medical History:  Diagnosis Date   Stroke [facility])    Past Surgical History:  Procedure Laterality Date   BUBBLE STUDY  02/15/2022   Procedure: BUBBLE STUDY;  Surgeon: Gordon Lean, MD;  Location: [place];  Service: Cardiovascular;;   IR CT HEAD LTD  02/11/2022   IR CT HEAD LTD  02/11/2022   IR CT HEAD LTD  02/11/2022   IR PERCUTANEOUS ART THROMBECTOMY/INFUSION INTRACRANIAL INC DIAG ANGIO  02/11/2022   IR PERCUTANEOUS ART THROMBECTOMY/INFUSION INTRACRANIAL INC DIAG ANGIO  02/11/2022   IR US GUIDE VASC ACCESS RIGHT  02/11/2022   IR US GUIDE VASC ACCESS RIGHT  02/11/2022   RADIOLOGY WITH ANESTHESIA N/A 02/11/2022   Procedure: IR WITH ANESTHESIA;  Surgeon: Radiologist, Medication, MD;  Location: [place];  Service: Radiology;  Laterality: N/A;   RADIOLOGY WITH ANESTHESIA N/A 02/11/2022   Procedure: RADIOLOGY WITH ANESTHESIA;  Surgeon: Radiologist, Medication, MD;  Location: [place];  Service: Radiology;  Laterality: N/A;   TEE WITHOUT CARDIOVERSION N/A 02/15/2022   Procedure: TRANSESOPHAGEAL ECHOCARDIOGRAM (TEE);  Surgeon: Gordon Lean, MD;  Location: [facility] ENDOSCOPY;  Service: Cardiovascular;  Laterality: N/A;   Patient Active Problem List   Diagnosis Date Noted   Hypocalcemia 03/22/2022   DVT, lower extremity, distal ([place]) 03/22/2022   Obesity 03/22/2022   Elevated LDL cholesterol level 03/22/2022   Acute  ischemic left middle cerebral artery (MCA) stroke ([place]) 02/18/2022   Cerebrovascular accident (CVA) due to occlusion of left middle cerebral artery ([place]) 02/11/2022    ONSET DATE: 02-11-22   REFERRING DIAG: I63.9 (ICD-10-CM) - Cerebrovascular accident (CVA), unspecified mechanism ([place]) R13.10 (ICD-10-CM) - Dysphagia, unspecified type   THERAPY DIAG:  Verbal apraxia  Aphasia  Rationale for Evaluation and Treatment Rehabilitation  SUBJECTIVE:   SUBJECTIVE STATEMENT: "Yeah."  Pt accompanied by: self  PERTINENT HISTORY: Gordon Fisher is a 61 year old male in relatively good health who was admitted via [facility] on 02/11/22 with right sided weakness, right facial droop and slurred speech. CTH showed L-MCA hyperdense sign and he received TNK prior to transfer. CTA showed L-MCA MI occlusion and he underwent cerebral angio with thrombectomy and TICI 3 flow by Dr. Nelida Gores Norma Fisher.  Follow up MRI brain done revealing small areas of acute infarct infarct in L-MCA territory with mild hemorrhagic transformation in left parietal lobe and L-MCA bifurcation residual vascular thrombus. He was taken back for thrombectomy with stent for dissection but had intra-stent occlusion which was unable to be recanalized. Post op CT showed small SAH which was stable on follow up. He was started on ASA/Brillinta. MBS 04/18 showed oral dysphagia with oral delay and no penetration or aspiration. He was started on D3, thins with supervision. Repeat MRI brain on 04/16 showed  extensive evolving acute ischemic L-MCA occlusion increased in size with associated edema, few additional small acute cortical subcortical infarcts involving contralateral right frontal lobe and left ACA distribution. Stroke felt to be due to unknown etiology and wife elected on Zio patch on discharge to rule out A fib. 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 Gordon Fisher was d/[facility]'d from [facility] on 03-19-22, and has had HHST since that time until last week.   PAIN:  Are you having pain? No  PATIENT GOALS Pt indicated he would like talking to improve  OBJECTIVE:  DIAGNOSTIC FINDINGS: MRI without contrast 02/13/22 IMPRESSION: 1. Extensive evolving acute ischemic left MCA distribution infarct, increased in size as compared to previous MRI from 02/11/2022. Associated edema without significant midline shift. 2. Small volume acute subarachnoid hemorrhage at the posterior aspect of the left Sylvian fissure, stable. 3. Few additional small volume acute ischemic cortical and subcortical infarcts involving the contralateral right frontal lobe and left ACA distribution as above. 4. Underlying chronic microvascular ischemic disease.    PATIENT REPORTED OUTCOME MEASURES (PROM): Communication Effectiveness Survey: Pt scored 8/32 - higher scores indicate more effective communication/QOL.   TODAY'S TREATMENT:  10/11/22: SLP began educating pt/wife how to create icons using camera, altered pronunciation text field for "Gordon Fisher", and searching online for photo. By session end pt was accessing orange three-bar icon in upper right hand corner independently when cued "what do you press to modify or add icons?", and moving icons independently. Pt and wife to make icons for family, and use for communicating food choices.  10-08-22: Lingraphica start trial date today. SLP introduced device to pt/wife. Pt with functional verbalization within 3 seconds of initiation for "ham", "tomato", "cheese", "Gordon Fisher", and "sandwich". Pt produced "sandwich" spontaneously after 8 seconds and mutliple attempts, without label. SLP introduced how to move, add, and delete icons. Pt communicated his sandwich choice ("sandwich meat" originally, and then "ham" after it was added to page), and toppings choices (tomato, cheese) with consistent min-mod cues, faded to occasional min-mod cues. Pt and  wife to use device over the weekend for food - - SLP to assist/teach pt and [name] how to add pictures to icons for family members next session.  09/27/22: Lingraphica not yet sent from [facility]. Should be here by pt's next appointment.  Gordon Fisher told SLP names of people at their Thanksgiving celebration, with usual mod to max assist. Visual cues were most effective on pt's most difficult words. SLP assessed pt's reading comprehension - 80% with 4-5 words.   11/8/23[name] stated they had been busy this week with appointments but still work at practicing some. SLP initiated a 20-minute discussion today with Evette Doffing, and SLP explaining speech generating device in depth, showing Gordon Fisher and [name] options for trial device and deciding on the Lingraphica Touch Talk. SLP to make referral for this in the next few days.  Family names were written with mod-max A usually - pt got first 3 letters correct with childrens and grandchildrens' names. SLP showed [name] how to break up daughter's name to [id number] to encourage brain's processing in a different way in order to foster new neural pathways to say the name.  09/06/22: SLP targeted verbal expression with picture naming and divergent naming. Pt notably demonstrates he knows the word/s he wants to say but is finding it difficult to pronounce them (verbal apraxia). Pt's expression improves with visual cues (oral cues from SLP for initial  phoneme/sound) - 75% successful even without sound necessary - just visual cue for articulator placement.   09/01/22: SLP worked with pt's verbal expression and auditory comprehension today. Simple conversation/sentences were understood 90% of the time, 100% with one repeat by speaker. With opposites/paired words, pt had 70% success, improved to 90% with min A. With cloze phrases/sentences pt's response accuracy improved with 1 syllable words, but overall success was 17% with min A, improved to 50% with mod-max A. Often, pt knew the response  but knew he was going to misarticulate the response. Written cues were helpful for pt here. SLP encouraged wife to cont with cloze phrases and paired words at home.  10/25/23[name] brought pt's cards today and SLP reviewed them with pt, modleing to wife cueing methods and hierarchy.Pt functionally said 6/7 salient objects (without written words) with mod-max cues, and 1/7 spontaneously. He said family names with written cues 6/7 (functionally), and 1/7 wth max cues Janett Billow"). Pt demo'd understnading of directions in context 75% today. SLP introduce possibility of Lingraphica to pt and wife and educated about studies proving that AAC assists verbal communication and that pt may not have to have assistance with a AAC device but we will know more as time progresses.   08/23/22: SLP targeted verbal expression with pt today. Heavy perseveration with "[name]" grandson's name for all J names. SLP even did months of the year and pt cont to have perseveration with [name] for all J names. "[name]" - pt cont to make this into two syllables 90% of the time, with consistent max A. Pt used multi-modal communization using chicken clucks with SLP assistance and encouragement when pt could not say "chickens", and pointed to ages of his grandchildren with SLP writing numbers 1-10. Mod-max A needed for grandson 2 months old. Pt showed 1 and then gestured taking a short person's height for SLP to know young child. MAX A needed for month names > 1 syllable. With one syllable months pt 80% success. Pt told SLP name of his truck, and functionally told SLP name of one of his two tractors. Req'd mod/max A otherwise. Pt to cont to practice salient/high frequency/high importance words.   08/18/22: SLP targeted spelling today with pt - 3 letter words 100%, 4-letter words 80% - [name] stated pt had s/sx dyslexia prior to CVA. SLP to monitor this. SLP provided some other hints/helpful suggestions for home practice and communication at home  today. SLP noted pt read 3 and 4 letter words 50% accuracy when spelled correctly.   08/16/22: SLP assessed pt's reading comprehension - pt reading single words 6/6 and simple sentences of 6-9 words ("the boy is riding his bike in the park") 6/6. [name] said pt having difficulty reading newspaper from home. SLP did reading task (newspaper) with pt today. SLP highlighted key words for pt and he answered 4/6 questions correct by pt answering yes/no and pointing to correct portion in the article to indicate place where the answer was found. Pt will return CES next session or two.    PATIENT EDUCATION: Education details: see "Today's therapy" Person educated: Patient and Spouse Education method: Explanation and Demonstration Education comprehension: verbalized understanding and needs further education   GOALS: Goals reviewed with patient? Yes, in general  SHORT TERM GOALS: Target date: 08/31/2022 extended one week due to visits  Pt will name 6 salient family names when given picture and written name with consistent mod-max cues in 3 sessions (precursor for speech generating device) Baseline:08-25-22. 09-01-22 Goal status:  partially met  2.  Pt will produce simple phoneme/ bilabial+/a/  /a/, /ba/, /ma/, and/or /pa/ simultaneously with SLP 65% success (equal # reps/stimulus) in 2 sessions Baseline:  Goal status: deferred - pt to focus on salient items/terms  3.  Pt will demo understanding of simple commands in context 80% over three sessions Baseline: 08-25-22, 09-01-22 Goal status: Partially met  4.  Pt will indicate understanding of a complex sentence 85% of the time in 3 sessions (precursor for speech generating device) Baseline:  Goal status: not met  5.  Pt will copy written family names with 50% success in two sessions  Baseline:  Goal Status: met   LONG TERM GOALS: Target date: 10/24/2022  (or 20th visit)  Pt will name 6 salient family names when given picture and written name  with consistent mod cues in 3 sessions (precursor for speech generating device) Baseline:  Goal status: Ongoing  2.  Pt will IMITATE simple phoneme/bilabial+/a/  /a/, /ba/, /ma/, and/or /pa/  65% success (equal # reps/stimulus) in 2 sessions Baseline:  Goal status: deferred - working with pt on more salient words  3.  Pt will generate meaningful message understood by [name] using speech generating device 60% of the time in 3 sessions Baseline:  Goal status: Ongoing  4.  [name] will indicate that spontaneous communication is easier than during week of evaluation Baseline:  Goal status: Ongoing   ASSESSMENT:  CLINICAL IMPRESSION: He is a 61 y.o. male who cont to be seen today for treatment of verbal apraxia (as pt often indicates he knows the response but knows it will not be articulated correctly) and aphasia. [place] education continues today. SEE [place] NOTE from today for more details.   OBJECTIVE IMPAIRMENTS include expressive language, receptive language, and aphasia. These impairments are limiting patient from return to work, managing medications, managing appointments, household responsibilities, ADLs/IADLs, and effectively communicating at home and in community. Factors affecting potential to achieve goals and functional outcome are ability to learn/carryover information and severity of impairments. Patient will benefit from skilled SLP services to address above impairments and improve overall function.  REHAB POTENTIAL: Good  PLAN: SLP FREQUENCY: 2x/week  SLP DURATION: 12 weeks  PLANNED INTERVENTIONS: Language facilitation, Environmental controls, Cueing hierachy, Internal/external aids, Functional tasks, and Multimodal communication approach    Franceen Erisman, CCC-SLP 10/11/2022, 12:58 PM

## 2022-10-11 NOTE — Therapy (Signed)
OUTPATIENT OCCUPATIONAL THERAPY NEURO TREATMENT NOTE  Patient Name: Gordon Fisher MRN: [id number] DOB:[birth year], 61 y.o., male Today's Date: 10/11/2022  PCP: none on file REFERRING PROVIDER: Jennye Boroughs, MD    OT End of Session - 10/11/22 0937     Visit Number 17    Number of Visits 30    Date for OT Re-Evaluation 11/19/22    Authorization Type [employer]    Authorization Time Period VL: 60 combined (OT/PT/ST)    Authorization - Visit Number 17    Authorization - Number of Visits 20    OT Start Time 0933    OT Stop Time 1015    OT Time Calculation (min) 42 min    Activity Tolerance Patient tolerated treatment well    Behavior During Therapy WFL for tasks assessed/performed                     Past Medical History:  Diagnosis Date   Stroke [facility])    Past Surgical History:  Procedure Laterality Date   BUBBLE STUDY  02/15/2022   Procedure: BUBBLE STUDY;  Surgeon: Werner Lean, MD;  Location: [place];  Service: Cardiovascular;;   IR CT HEAD LTD  02/11/2022   IR CT HEAD LTD  02/11/2022   IR CT HEAD LTD  02/11/2022   IR PERCUTANEOUS ART THROMBECTOMY/INFUSION INTRACRANIAL INC DIAG ANGIO  02/11/2022   IR PERCUTANEOUS ART THROMBECTOMY/INFUSION INTRACRANIAL INC DIAG ANGIO  02/11/2022   IR US GUIDE VASC ACCESS RIGHT  02/11/2022   IR US GUIDE VASC ACCESS RIGHT  02/11/2022   RADIOLOGY WITH ANESTHESIA N/A 02/11/2022   Procedure: IR WITH ANESTHESIA;  Surgeon: Radiologist, Medication, MD;  Location: [place];  Service: Radiology;  Laterality: N/A;   RADIOLOGY WITH ANESTHESIA N/A 02/11/2022   Procedure: RADIOLOGY WITH ANESTHESIA;  Surgeon: Radiologist, Medication, MD;  Location: [place];  Service: Radiology;  Laterality: N/A;   TEE WITHOUT CARDIOVERSION N/A 02/15/2022   Procedure: TRANSESOPHAGEAL ECHOCARDIOGRAM (TEE);  Surgeon: Werner Lean, MD;  Location[facility] ENDOSCOPY;  Service: Cardiovascular;  Laterality: N/A;   Patient Active Problem List    Diagnosis Date Noted   Hypocalcemia 03/22/2022   DVT, lower extremity, distal ([place]) 03/22/2022   Obesity 03/22/2022   Elevated LDL cholesterol level 03/22/2022   Acute ischemic left middle cerebral artery (MCA) stroke ([place]) 02/18/2022   Cerebrovascular accident (CVA) due to occlusion of left middle cerebral artery ([place]) 02/11/2022    ONSET DATE: referral 07/15/22 (CVA 02/11/22)  REFERRING DIAG: I63.9 (ICD-10-CM) - Cerebrovascular accident (CVA), unspecified mechanism ([place]) R13.10 (ICD-10-CM) - Dysphagia, unspecified type   THERAPY DIAG:  Hemiplegia and hemiparesis following cerebral infarction affecting right dominant side ([place])  Other lack of coordination  Other disturbances of skin sensation  Muscle weakness (generalized)  Other abnormalities of gait and mobility  Rationale for Evaluation and Treatment Rehabilitation  SUBJECTIVE:   SUBJECTIVE STATEMENT: Pt's spouse reports no pain. 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 No  FALLS: Has patient fallen in last 6 months? Yes. Number of falls 1  PLOF: Independent, Independent with basic ADLs, and Independent with gait  PATIENT GOALS: continue to make gains, independence with getting dressed   OBJECTIVE:   HAND DOMINANCE: Right  FUNCTIONAL OUTCOME MEASURES: FOTO: 11  UPPER EXTREMITY ROM    Active ROM: Pt with no AROM during  evaluation.  Pt was able to elicit min shoulder elevation and slight gross grasp when cued and provided increased time to initiate movement.   09/22/22: Pt still with no AROM in upright position, is able to elicit slight internal rotation and mod elbow flexion/extension in gravity assisted position.  Passive ROM Right Eval - 9/25  Shoulder flexion 100  Shoulder abduction   Shoulder adduction   Shoulder extension   Shoulder  internal rotation WNL  Shoulder external rotation onset of pain >5-10* from neutral  Middle trapezius   Lower trapezius   Elbow flexion WNL  Elbow extension WNL  Wrist flexion WNL  Wrist extension WNL  Wrist ulnar deviation   Wrist radial deviation   Wrist pronation   Wrist supination   (Blank rows = not tested)  HAND FUNCTION: No functional grasp. Trace gross grasp with increased time for initiation  SENSATION: Difficult to assess due to expressive aphasia  MUSCLE TONE: RUE: Mild and Hypertonic  ------------------------------------------------------------------------------------------------------------------------------------------------------------ (objective measures above completed at initial evaluation unless otherwise dated)  TODAY'S TREATMENT:  10/11/22 NMR: Supine NMR with focus on shoulder flexion and elbow flexion/extension.  Pt able to elicit elbow flexion and extension, however with use of compensatory internal rotation.  Completed exercise both from side as well as with shoulder in 90* flexion with support from OT to maintain position.  Pt utilizing increased internal rotation when attempting to activate shoulder flexion.  Pt achieving ~10* shoulder flexion with use of internal rotation and support from OT to decrease effect of gravity. NMR: Sidelying NMR with OT providing support at wrist and intermittent light tactile cues at elbow and torso to decrease compensatory trunk movements and internal rotation.  Pt able to progress from ~30* to ~45 * shoulder flexion in sidelying with multimodal cues and support to maintain shoulder in alignment. Pt able to recognize when beginning to "turn on" internal rotation by end of task, with ability to stop compensatory movement and increase flexion a few more degrees. UE Ranger: pt completed with focus on shoulder flexion and elbow extension.  OT providing support at elbow to facilitate shoulder approximation due to subluxation.  Pt  able to complete with improved control and no shoulder internal rotation in this position today.  OT reiterated focus on quality of movement and to decrease onset of internal rotation as able.   10/08/22 Reviewed precautions and parameters for use of NMES.  Protocol set up on [place] system, however no stimulation produced from machine, therefore terminated task with plan to trouble shoot and attempt at future session. AAROM: shoulder shrugs and scapular retraction completed sitting in front of mirror for visual feedback.  Therapist rolled up shirt sleeve to allow pt to visualize R shoulder with movements.  Pt demonstrating min elevation. AAROM: closed chain movements with dowel and use of mirror for visual feedback. Completed chest press and shoulder flexion with dowel for improved symmetry and activation of RUE. OT wrapped RUE with coban to dowel to maintain grasp.  OT educated pt and spouse on positioning and to maintain good shoulder approximation during exercises and not let UE "hang".   09/22/22 ADL: pt is able to don shirt Supervision approx 75% of time.  Spouse reports that sometimes they are in a hurry, or he is too fatigued and she then assists him.  She reports that sometimes he may get stuck getting shirt pulled down torso or over shoulder.  Pt demonstrating donning/doffing pull over shirt with min cues for technique to ensure advancing shirt  over elbow prior to donning shirt overhead and cues to adjust at shoulder. Kinesiotape: OT educated pt and spouse on purpose of kinesiotaping for increased positioning s/p shoulder subluxation.  OT applied kinesiotape to shoulder and at scapula for improved positioning and support. LB dressing: completed simulated LB dressing with donning/doffing pants with gait belt.  Pt able to complete with supervision, however still with no use of RUE even as stabilizer during clothing management.     PATIENT EDUCATION: Ongoing condition-specific education  related to therapeutic interventions completed this session Person educated: Patient and Spouse Education method: Explanation, Demonstration, Tactile cues, Verbal cues, and Handouts Education comprehension: verbalized understanding and needs further education   HOME EXERCISE PROGRAM: Written exercises of shoulder and elbow in supine with cues for hand placement and facilitation.   Self-ROM handout from [facility] lab  MedBridge Access Code: [email or web address] URL: [email or web address] - Supine Shoulder Flexion AAROM with Hands Clasped  - 1 x daily - 1 sets - 10 reps - Seated Elbow Extension and Shoulder External Rotation AAROM at Table with Towel  - 1 x daily - 1 sets - 10 reps - Finger Extension with Wrist Extension Caregiver PROM  - 1 x daily - 1 sets - 10 reps  GOALS: Goals reviewed with patient? Yes  SHORT TERM GOALS:  Target date: 10/22/22 Pt and spouse will be independent with advanced PROM and self-ROM HEP for shoulder stability and ROM. Baseline: Goal status: IN Progress  2.  Pt and spouse will verbalize understanding of AE and/or task modifications as needed for fastening shoes.  Baseline:  Goal status: IN Progress   LONG TERM GOALS: Target date 11/19/22  Patient will be able to incorporate RUE into UB/LB dressing task at stabilizer to gross assist level. Baseline: Goal status: IN Progress  2.   Pt will utilize RUE as gross assist during grooming tasks (such as washing hands and face). Baseline:  Goal status: IN Progress  3.   Pt and spouse will verbalize and/or  demonstrate understanding of advanced treatment strategies and implementation at home (NMES, WB) to facilitate increased motor recovery.  Baseline:  Goal status: IN Progress  4.   Pt will demonstrate ability to utilize BUE during mobility tasks to pushup from chair with BUE on arm rests.  Baseline:  Goal status: IN Progress  5.   Pt will demonstrate ability to don/doff footwear (to include socks and  shoes) with supervision and use of AE PRN.  Baseline:  Goal status: IN Progress   ASSESSMENT:  CLINICAL IMPRESSION: Treatment session with focus on NMR with RUE. Pt benefited from sidelying position to facilitate increased shoulder flexion.  Pt able to decrease activation of internal rotation during attempts at shoulder flexion and elbow extension/flexion, demonstrating improvements in motor control. Plan to begin implementing NMES during future session as cleared by cardiologist Dr. Steffanie Dunn.  PERFORMANCE DEFICITS in functional skills including ADLs, IADLs, coordination, sensation, tone, ROM, strength, pain, flexibility, FMC, GMC, mobility, balance, body mechanics, vision, and UE functional use and psychosocial skills including environmental adaptation and routines and behaviors.   IMPAIRMENTS are limiting patient from ADLs and IADLs.   COMORBIDITIES may have co-morbidities  that affects occupational performance. Patient will benefit from skilled OT to address above impairments and improve overall function.   PLAN: OT FREQUENCY: 1-2x/week  OT DURATION: 8 weeks  PLANNED INTERVENTIONS: self care/ADL training, therapeutic exercise, therapeutic activity, neuromuscular re-education, manual therapy, passive range of motion, balance training, functional mobility training, splinting, electrical stimulation, compression bandaging, moist  heat, cryotherapy, patient/family education, visual/perceptual remediation/compensation, psychosocial skills training, energy conservation, coping strategies training, and DME and/or AE instructions  RECOMMENDED OTHER SERVICES: N/A  CONSULTED AND AGREED WITH PLAN OF CARE: Patient and family member/caregiver  PLAN FOR NEXT SESSION: Continue w/ scapular stability: shoulder shrugs, rolls, and scapular retraction; WB and dynamic standing, supine and sidelying NMR.  E-stim as cleared by MD.   Simonne Come, OTR/L 10/11/2022, 9:38 AM

## 2022-10-13 LAB — CUP PACEART REMOTE DEVICE CHECK
Date Time Interrogation Session: 20231213060853
Implantable Pulse Generator Implant Date: 20230807
Pulse Gen Serial Number: 511016023

## 2022-10-14 ENCOUNTER — Ambulatory Visit: Payer: 59

## 2022-10-14 DIAGNOSIS — R482 Apraxia: Secondary | ICD-10-CM | POA: Diagnosis not present

## 2022-10-14 DIAGNOSIS — R4701 Aphasia: Secondary | ICD-10-CM

## 2022-10-14 NOTE — Therapy (Signed)
OUTPATIENT SPEECH LANGUAGE PATHOLOGY TREATMENT   Patient Name: Gordon Fisher MRN: [id number] DOB:1961/05/14, 61 y.o., male Today's Date: 10/14/2022  PCP: TBD REFERRING PROVIDER: Jennye Boroughs, MD             Past Medical History:  Diagnosis Date   Stroke [facility])    Past Surgical History:  Procedure Laterality Date   BUBBLE STUDY  02/15/2022   Procedure: BUBBLE STUDY;  Surgeon: Werner Lean, MD;  Location: [place];  Service: Cardiovascular;;   IR CT HEAD LTD  02/11/2022   IR CT HEAD LTD  02/11/2022   IR CT HEAD LTD  02/11/2022   IR PERCUTANEOUS ART THROMBECTOMY/INFUSION INTRACRANIAL INC DIAG ANGIO  02/11/2022   IR PERCUTANEOUS ART THROMBECTOMY/INFUSION INTRACRANIAL INC DIAG ANGIO  02/11/2022   IR US GUIDE VASC ACCESS RIGHT  02/11/2022   IR US GUIDE VASC ACCESS RIGHT  02/11/2022   RADIOLOGY WITH ANESTHESIA N/A 02/11/2022   Procedure: IR WITH ANESTHESIA;  Surgeon: Radiologist, Medication, MD;  Location: [place];  Service: Radiology;  Laterality: N/A;   RADIOLOGY WITH ANESTHESIA N/A 02/11/2022   Procedure: RADIOLOGY WITH ANESTHESIA;  Surgeon: Radiologist, Medication, MD;  Location: [place];  Service: Radiology;  Laterality: N/A;   TEE WITHOUT CARDIOVERSION N/A 02/15/2022   Procedure: TRANSESOPHAGEAL ECHOCARDIOGRAM (TEE);  Surgeon: Werner Lean, MD;  Location: [facility] ENDOSCOPY;  Service: Cardiovascular;  Laterality: N/A;   Patient Active Problem List   Diagnosis Date Noted   Hypocalcemia 03/22/2022   DVT, lower extremity, distal ([place]) 03/22/2022   Obesity 03/22/2022   Elevated LDL cholesterol level 03/22/2022   Acute ischemic left middle cerebral artery (MCA) stroke ([place]) 02/18/2022   Cerebrovascular accident (CVA) due to occlusion of left middle cerebral artery ([place]) 02/11/2022    ONSET DATE: 02-11-22   REFERRING DIAG: I63.9 (ICD-10-CM) - Cerebrovascular accident (CVA), unspecified mechanism ([place]) R13.10 (ICD-10-CM) - Dysphagia, unspecified type    THERAPY DIAG:  Verbal apraxia  Aphasia  Rationale for Evaluation and Treatment Rehabilitation  SUBJECTIVE:   SUBJECTIVE STATEMENT: "Yeah."  Pt accompanied by: self  PERTINENT HISTORY: [name] is a 61 year old male in relatively good health who was admitted via [facility] on 02/11/22 with right sided weakness, right facial droop and slurred speech. CTH showed L-MCA hyperdense sign and he received TNK prior to transfer. CTA showed L-MCA MI occlusion and he underwent cerebral angio with thrombectomy and TICI 3 flow by Dr. Nelida Gores [name].  Follow up MRI brain done revealing small areas of acute infarct infarct in L-MCA territory with mild hemorrhagic transformation in left parietal lobe and L-MCA bifurcation residual vascular thrombus. He was taken back for thrombectomy with stent for dissection but had intra-stent occlusion which was unable to be recanalized. Post op CT showed small SAH which was stable on follow up. He was started on ASA/Brillinta. MBS 04/18 showed oral dysphagia with oral delay and no penetration or aspiration. He was started on D3, thins with supervision. Repeat MRI brain on 04/16 showed extensive evolving acute ischemic L-MCA occlusion increased in size with associated edema, few additional small acute cortical subcortical infarcts involving contralateral right frontal lobe and left ACA distribution. Stroke felt to be due to unknown etiology and wife elected on Zio patch on discharge to rule out A fib. 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 [name] was d/c'd from [facility] on 03-19-22, and has had HHST since that time until last week.  PAIN:  Are you having pain? No  PATIENT GOALS Pt indicated he would like talking to improve  OBJECTIVE:  DIAGNOSTIC FINDINGS: MRI without contrast 02/13/22 IMPRESSION: 1. Extensive evolving acute ischemic left MCA distribution  infarct, increased in size as compared to previous MRI from 02/11/2022. Associated edema without significant midline shift. 2. Small volume acute subarachnoid hemorrhage at the posterior aspect of the left Sylvian fissure, stable. 3. Few additional small volume acute ischemic cortical and subcortical infarcts involving the contralateral right frontal lobe and left ACA distribution as above. 4. Underlying chronic microvascular ischemic disease.    PATIENT REPORTED OUTCOME MEASURES (PROM): Communication Effectiveness Survey: Pt scored 8/32 - higher scores indicate more effective communication/QOL.   TODAY'S TREATMENT:  10/14/22: Pt brought Lingraphica with him today - pt/wife have not had opportunity to do anything with it due to schedule, and internet down at their home. SLP spent session targeting icon generation with icons for pt's family, pt recording his own speech for icons, and taking pictures with camera. Pt gained independence with manipulating photos to size and position independently, and inputting text for icons independently. He req'd min nonverbal cues for pressing "add" to add new icon, and mod cues usually for accessing camera to take pictures and voice recorder to record his voice, as well as pressing "record" and "stop" for generating a voice recording. Wife to cont to work with pt at home. During session, pt demonstrated more spontaneous speech - family names and sandwich toppings due to having words present at time of verbalization. Hardest production cont to be "Janett Billow" (daguther's name).  10/11/22: SLP began educating pt/wife how to create icons using camera, altered pronunciation text field for "leesa", and searching online for photo. By session end pt was accessing orange three-bar icon in upper right hand corner independently when cued "what do you press to modify or add icons?", and moving icons independently. Pt and wife to make icons for family, and use for communicating  food choices.  10-08-22: Lingraphica start trial date today. SLP introduced device to pt/wife. Pt with functional verbalization within 3 seconds of initiation for "ham", "tomato", "cheese", "[name]", and "sandwich". Pt produced "sandwich" spontaneously after 8 seconds and mutliple attempts, without label. SLP introduced how to move, add, and delete icons. Pt communicated his sandwich choice ("sandwich meat" originally, and then "ham" after it was added to page), and toppings choices (tomato, cheese) with consistent min-mod cues, faded to occasional min-mod cues. Pt and wife to use device over the weekend for food - - SLP to assist/teach pt and [name] how to add pictures to icons for family members next session.  09/27/22: Lingraphica not yet sent from [facility]. Should be here by pt's next appointment.  [name] told SLP names of people at their Thanksgiving celebration, with usual mod to max assist. Visual cues were most effective on pt's most difficult words. SLP assessed pt's reading comprehension - 80% with 4-5 words.   11/8/23[name] stated they had been busy this week with appointments but still work at practicing some. SLP initiated a 20-minute discussion today with Evette Doffing, and SLP explaining speech generating device in depth, showing [name] and [name] options for trial device and deciding on the Lingraphica Touch Talk. SLP to make referral for this in the next few days.  Family names were written with mod-max A usually - pt got first 3 letters correct with childrens and grandchildrens' names. SLP showed [name] how to break up daughter's name to [id number] to encourage brain's processing  in a different way in order to foster new neural pathways to say the name.  09/06/22: SLP targeted verbal expression with picture naming and divergent naming. Pt notably demonstrates he knows the word/s he wants to say but is finding it difficult to pronounce them (verbal apraxia). Pt's expression improves with visual cues (oral  cues from SLP for initial phoneme/sound) - 75% successful even without sound necessary - just visual cue for articulator placement.   09/01/22: SLP worked with pt's verbal expression and auditory comprehension today. Simple conversation/sentences were understood 90% of the time, 100% with one repeat by speaker. With opposites/paired words, pt had 70% success, improved to 90% with min A. With cloze phrases/sentences pt's response accuracy improved with 1 syllable words, but overall success was 17% with min A, improved to 50% with mod-max A. Often, pt knew the response but knew he was going to misarticulate the response. Written cues were helpful for pt here. SLP encouraged wife to cont with cloze phrases and paired words at home.  10/25/23[name] brought pt's cards today and SLP reviewed them with pt, modleing to wife cueing methods and hierarchy.Pt functionally said 6/7 salient objects (without written words) with mod-max cues, and 1/7 spontaneously. He said family names with written cues 6/7 (functionally), and 1/7 wth max cues Janett Billow"). Pt demo'd understnading of directions in context 75% today. SLP introduce possibility of Lingraphica to pt and wife and educated about studies proving that AAC assists verbal communication and that pt may not have to have assistance with a AAC device but we will know more as time progresses.   08/23/22: SLP targeted verbal expression with pt today. Heavy perseveration with "[name]" grandson's name for all J names. SLP even did months of the year and pt cont to have perseveration with [name] for all J names. "[name]" - pt cont to make this into two syllables 90% of the time, with consistent max A. Pt used multi-modal communization using chicken clucks with SLP assistance and encouragement when pt could not say "chickens", and pointed to ages of his grandchildren with SLP writing numbers 1-10. Mod-max A needed for grandson 2 months old. Pt showed 1 and then gestured taking a  short person's height for SLP to know young child. MAX A needed for month names > 1 syllable. With one syllable months pt 80% success. Pt told SLP name of his truck, and functionally told SLP name of one of his two tractors. Req'd mod/max A otherwise. Pt to cont to practice salient/high frequency/high importance words.    PATIENT EDUCATION: Education details: see "Today's therapy" Person educated: Patient and Spouse Education method: Explanation and Demonstration Education comprehension: verbalized understanding and needs further education   GOALS: Goals reviewed with patient? Yes, in general  SHORT TERM GOALS: Target date: 08/31/2022 extended one week due to visits  Pt will name 6 salient family names when given picture and written name with consistent mod-max cues in 3 sessions (precursor for speech generating device) Baseline:08-25-22. 09-01-22 Goal status: partially met  2.  Pt will produce simple phoneme/ bilabial+/a/  /a/, /ba/, /ma/, and/or /pa/ simultaneously with SLP 65% success (equal # reps/stimulus) in 2 sessions Baseline:  Goal status: deferred - pt to focus on salient items/terms  3.  Pt will demo understanding of simple commands in context 80% over three sessions Baseline: 08-25-22, 09-01-22 Goal status: Partially met  4.  Pt will indicate understanding of a complex sentence 85% of the time in 3 sessions (precursor for speech generating device)  Baseline:  Goal status: not met  5.  Pt will copy written family names with 50% success in two sessions  Baseline:  Goal Status: met   LONG TERM GOALS: Target date: 10/24/2022  (or 20th visit)  Pt will name 6 salient family names when given picture and written name with consistent mod cues in 3 sessions (precursor for speech generating device) Baseline:  Goal status: Ongoing  2.  Pt will IMITATE simple phoneme/bilabial+/a/  /a/, /ba/, /ma/, and/or /pa/  65% success (equal # reps/stimulus) in 2 sessions Baseline:  Goal  status: deferred - working with pt on more salient words  3.  Pt will generate meaningful message understood by [name] using speech generating device 60% of the time in 3 sessions Baseline:  Goal status: Ongoing  4.  [name] will indicate that spontaneous communication is easier than during week of evaluation Baseline:  Goal status: Ongoing   ASSESSMENT:  CLINICAL IMPRESSION: He is a 61 y.o. male who cont to be seen today for treatment of verbal apraxia (as pt often indicates he knows the response but knows it will not be articulated correctly) and aphasia. [place] education continues today. SEE [place] NOTE from today for more details.   OBJECTIVE IMPAIRMENTS include expressive language, receptive language, and aphasia. These impairments are limiting patient from return to work, managing medications, managing appointments, household responsibilities, ADLs/IADLs, and effectively communicating at home and in community. Factors affecting potential to achieve goals and functional outcome are ability to learn/carryover information and severity of impairments. Patient will benefit from skilled SLP services to address above impairments and improve overall function.  REHAB POTENTIAL: Good  PLAN: SLP FREQUENCY: 2x/week  SLP DURATION: 12 weeks  PLANNED INTERVENTIONS: Language facilitation, Environmental controls, Cueing hierachy, Internal/external aids, Functional tasks, and Multimodal communication approach    Ziyanna Tolin, CCC-SLP 10/14/2022, 12:46 PM

## 2022-10-14 NOTE — Therapy (Signed)
OUTPATIENT PHYSICAL THERAPY NEURO TREATMENT   Patient Name: Gordon Fisher MRN: [id number] DOB:09-11-1961, 61 y.o., male Today's Date: 10/18/2022   PCP: None listed REFERRING PROVIDER: Jennye Boroughs, Fisher    PT End of Session - 10/18/22 1151     Visit Number 19    Number of Visits 40    Date for PT Re-Evaluation 12/13/22    Authorization Type [employer] [id number]    Authorization - Visit Number 28    Authorization - Number of Visits 20    Progress Note Due on Visit 10    PT Start Time 1018    PT Stop Time 1100    PT Time Calculation (min) 42 min    Equipment Utilized During Treatment Gait belt    Activity Tolerance Patient tolerated treatment well    Behavior During Therapy WFL for tasks assessed/performed                      Past Medical History:  Diagnosis Date   Stroke [facility])    Past Surgical History:  Procedure Laterality Date   BUBBLE STUDY  02/15/2022   Procedure: BUBBLE STUDY;  Surgeon: Gordon Lean, Fisher;  Location: [place];  Service: Cardiovascular;;   IR CT HEAD LTD  02/11/2022   IR CT HEAD LTD  02/11/2022   IR CT HEAD LTD  02/11/2022   IR PERCUTANEOUS ART THROMBECTOMY/INFUSION INTRACRANIAL INC DIAG ANGIO  02/11/2022   IR PERCUTANEOUS ART THROMBECTOMY/INFUSION INTRACRANIAL INC DIAG ANGIO  02/11/2022   IR US GUIDE VASC ACCESS RIGHT  02/11/2022   IR US GUIDE VASC ACCESS RIGHT  02/11/2022   RADIOLOGY WITH ANESTHESIA N/A 02/11/2022   Procedure: IR WITH ANESTHESIA;  Surgeon: Gordon Fisher;  Location: [place];  Service: Radiology;  Laterality: N/A;   RADIOLOGY WITH ANESTHESIA N/A 02/11/2022   Procedure: RADIOLOGY WITH ANESTHESIA;  Surgeon: Gordon Fisher;  Location: [place];  Service: Radiology;  Laterality: N/A;   TEE WITHOUT CARDIOVERSION N/A 02/15/2022   Procedure: TRANSESOPHAGEAL ECHOCARDIOGRAM (TEE);  Surgeon: Gordon Lean, Fisher;  Location: [facility] ENDOSCOPY;  Service: Cardiovascular;  Laterality: N/A;   Patient Active  Problem List   Diagnosis Date Noted   Hypocalcemia 03/22/2022   DVT, lower extremity, distal ([place]) 03/22/2022   Obesity 03/22/2022   Elevated LDL cholesterol level 03/22/2022   Acute ischemic left middle cerebral artery (MCA) stroke ([place]) 02/18/2022   Cerebrovascular accident (CVA) due to occlusion of left middle cerebral artery ([place]) 02/11/2022    ONSET DATE: April 2023  REFERRING DIAG: I63.9 (ICD-10-CM) - Cerebrovascular accident (CVA), unspecified mechanism ([place]) R13.10 (ICD-10-CM) - Dysphagia, unspecified type   THERAPY DIAG:  Muscle weakness (generalized)  Other abnormalities of gait and mobility  Unsteadiness on feet  Difficulty in walking, not elsewhere classified  Rationale for Evaluation and Treatment Rehabilitation  SUBJECTIVE:  SUBJECTIVE STATEMENT: Wife reports "everything has been going well." Pt accompanied by: significant other  PERTINENT HISTORY: 02/11/2022 patient developed right-sided weakness, aphasia, went to hospital for evaluation.  Code stroke was activated.  He received TNK and then transferred to [facility].  He underwent cerebral angiogram with thrombectomy.  Unfortunately vessel reoccluded later the day and the second thrombectomy was attempted.  Left M2 dissection was identified and treated with stent but unfortunately stent demonstrated reocclusion   PAIN:  Are you having pain? No  PRECAUTIONS: Shoulder and Fall, Aphasia   WEIGHT BEARING RESTRICTIONS No  FALLS: Has patient fallen in last 6 months? Yes. Number of falls 1, required 3-assist to recover  LIVING ENVIRONMENT: Lives with: lives with their spouse Lives in: House/apartment Stairs: Yes: Internal: flight steps; bilateral but cannot reach both and External: 4-5 steps; ramp is also present.  Has  following equipment at home: Hemi walker, Wheelchair (manual), and sit-stand lift  PLOF: Independent  PATIENT GOALS regain independence  OBJECTIVE:     TODAY'S TREATMENT: 10/18/22 Activity Comments  sidestepping Along II bars; 1 UE support  Alt side stepping Cueing to step "tall" with R foot on return to midline   backwards walking Along II bars; 1 UE support; good response to "kick the leg back" to elongate R step back; manual cues at chest to avoid compensating with trunk flexion   Forward/back stepping over 1/2 foam roll 15x each Cueing to shift onto standing LE and taller step over with R LE; able to wean to perform without UE support and with min A  alt toe tap on step 6" Able to wean UE support but required reminders to shift R upon tapping L foot and min A required to lift R foot onto step  mini squat with green TB around R hip 15x Initially required increased cues/explanation to elicit appropriate wt shift, then with good ability to perform symmetric LE use   gait with quad cane outside on sidewalk and ramp [id number]  CGA througout; cueing to increase R step length d/t toe scraping on floor with fatigue- good carryover              PATIENT EDUCATION: Education details: edu on benefit of toe cap to R shoe; encouraged patient to bring his quad cane into next session Person educated: Patient and Spouse Education method: Explanation, Demonstration, Tactile cues, Verbal cues, and Handouts Education comprehension: verbalized understanding and returned demonstration     HOME EXERCISE PROGRAM Last updated: 09/15/22 Access Code: [email or web address]: [email or web address] Date: 09/15/2022 Prepared by: Gordon Fisher  Exercises - Forward Backward Weight Shift with Counter Support  - 1 x daily - 5 x weekly - 2 sets - 10 reps - Side to Side Weight Shift with Counter Support  - 1 x daily - 5 x weekly - 2 sets - 10 reps - Mini Squat with  Counter Support  - 1 x daily - 5 x weekly - 2 sets - 10 reps - Stride Stance Weight Shift  - 1 x daily - 5 x weekly - 2 sets - 10 reps - Sit to stand in stride stance  - 2 x daily - 7 x weekly - 1-2 sets - 5 reps - Supine Bridge with Mini Swiss Ball Between Knees  - 1 x daily - 5 x weekly - 2 sets - 10 reps - Supine Heel Slide  - 1 x daily - 5 x weekly - 2 sets -  10 reps - Seated Isometric Hip Adduction with Ball  - 1 x daily - 5 x weekly - 2 sets - 10 reps - 3 sec hold    Below measures were taken at time of initial evaluation unless otherwise specified:     DIAGNOSTIC FINDINGS: Follow-up MRI brain done revealing small areas of acute infarct in left-MCA territory with mild hemorrhagic transformation in left parietal lobe and left-MCA bifurcation residual vascular thrombus   COGNITION: Overall cognitive status:  Difficult to assess due to aphasia   SENSATION: Light touch: Impaired  Proprioception: Impaired   COORDINATION: RLE impaired, some deficits on left foot with alternating movements and isolated movements  EDEMA:  None   MUSCLE TONE: RLE: Modifed Ashworth Scale 2 = More marked increase in muscle tone through most of the ROM, but affected part(s) easily moved right quad most affected, no clonus right plantarflexors   MUSCLE LENGTH: Able to achieve full right knee extension to PROM  DTRs:  Patella 3+ = Brisk  POSTURE: No Significant postural limitations  LOWER EXTREMITY ROM:     LLE WNL, RLE AROM limited by weakness  LOWER EXTREMITY MMT:    MMT Right Eval Left Eval  Hip flexion 3- 5  Hip extension    Hip abduction 3- 4  Hip adduction 3- 5  Hip internal rotation    Hip external rotation    Knee flexion 3 5  Knee extension 2+ 5  Ankle dorsiflexion 0 5  Ankle plantarflexion 1 3+  Ankle inversion    Ankle eversion    (Blank rows = not tested)  BED MOBILITY:  NT  TRANSFERS: Assistive device utilized: Hemi walker  Sit to stand: Modified  independence Stand to sit: SBA Chair to chair: SBA and CGA Floor:  Total assist  RAMP:  Level of Assistance: CGA Assistive device utilized: Hemi walker Ramp Comments:   CURB:  Level of Assistance: Min A Assistive device utilized: Nutritional therapist Comments: cues in sequence  STAIRS:  Level of Assistance: CGA and Min A  Stair Negotiation Technique: Step to Pattern with Single Rail on Left  Number of Stairs: 10   Height of Stairs: 4-6"  Comments: cues and guarding  GAIT: Gait pattern: step to pattern, decreased ankle dorsiflexion- Right, and circumduction- Right Distance walked: 82 Assistive device utilized: Hemi walker Level of assistance: CGA Comments: deficits during turning  FUNCTIONAL TESTs:  5 times sit to stand: 11.59 sec with 3 retro LOB and poor eccentric control Timed up and go (TUG): 34.75 sec Berg Balance Scale: 32/56 Dynamic Gait Index: NT 2 minute walk test: 82 ft w/ hemi-walker, 0.68 ft/sec  M-CTSIB  Condition 1: Firm Surface, EO 30 Sec, Normal and Mild Sway  Condition 2: Firm Surface, EC 30 Sec, Mild and Moderate Sway  Condition 3: Foam Surface, EO 30 Sec, Mild Sway  Condition 4: Foam Surface, EC 26 Sec, Moderate and Severe Sway     PATIENT SURVEYS:  FOTO 47%  TODAY'S TREATMENT:  assessment   PATIENT EDUCATION: Education details: assessment findings, PT scope of practice Person educated: Patient and Spouse Education method: Explanation Education comprehension: verbalized understanding   HOME EXERCISE PROGRAM: TBD    GOALS: Goals reviewed with patient? Yes  SHORT TERM GOALS: Target date: 11/01/2022    Patient will perform HEP with family/caregiver supervision for improved strength, balance, transfers, and gait  Baseline: Goal status: GOAL MET, 08/23/2022  2.  Ambulate 12 stairs with left HR and supervision to improve safety within home Baseline:  CGA-min A; per report CGA/supervision 6 steps; able to perform Goal status: MET  3.   Demonstrate improved safety and efficiency with ambulation per distance of 100 ft during 2MWT Baseline: 82 ft w/ HW and CGA; 111 ft 08/23/2022 Goal status: GOAL MET, 08/23/2022  4. Demonstrate improved gait speed to 1.8 ft/sec with right AFO and least restrictive AD to improve efficiency of gait  Baseline: 0.68 ft/sec at eval; (09/20/22) 1.59 ft/sec w/ platform RW and AFO  Goal status: On-going    LONG TERM GOALS: Target date: 12/13/2022    Demonstrate improved balance and reduced risk for falls per score 45/56 Berg Balance Test Baseline: 32/56; (09/20/22) 42/56 Goal status:Goal revised  2.  Demonstrate modified independent ambulation x 150 ft level surfaces using least restrictive AD in order to improve safety with home environment Baseline: CGA w/ HW; (09/20/22) supervision with HW level surfaces Goal status: IN PROGRESS  3.  Manifest improved safety with gait per time of 18 sec TUG test using least restrictive AD Baseline: 34.75 sec w/ HW; (09/20/22) 30.69 w/ HW and right AFO Goal status: IN PROGRESS   4. Stair ambulation x 15 steps with set-up assist to improve safety in home environment and reduce burden of care  Baseline: SBA with single HR, using AFO  Goal status: On-going      ASSESSMENT:  CLINICAL IMPRESSION: Patient arrived to session without complaints. Performed dynamic balance activities with focus onbR wt shift and decreased reliance on L UE support. Patient requires occasional cues for increased weight shift and step height, but with good carryover. Demonstrated good symmetric use of B LEs with squats with addition of manual cue. Gait training outside revealed some R LE fatigue resulting in R toe dragging on ground; thus may benefit from toe tap. Patient reported understanding of edu and without complaints at end of session.     OBJECTIVE IMPAIRMENTS Abnormal gait, decreased activity tolerance, decreased balance, decreased coordination, decreased knowledge of use  of DME, decreased mobility, difficulty walking, decreased strength, impaired sensation, impaired tone, impaired UE functional use, and improper body mechanics.   ACTIVITY LIMITATIONS carrying, lifting, bending, standing, squatting, stairs, transfers, bathing, toileting, dressing, reach over head, and locomotion level  PARTICIPATION LIMITATIONS: meal prep, cleaning, laundry, interpersonal relationship, driving, shopping, community activity, occupation, yard work, and leisure activities  [place] Time since onset of injury/illness/exacerbation are also affecting patient's functional outcome.   REHAB POTENTIAL: Excellent  CLINICAL DECISION MAKING: Evolving/moderate complexity  EVALUATION COMPLEXITY: Moderate  PLAN: PT FREQUENCY: 1-2/wk  PT DURATION: 12 weeks  PLANNED INTERVENTIONS: Therapeutic exercises, Therapeutic activity, Neuromuscular re-education, Balance training, Gait training, Patient/Family education, Self Care, Joint mobilization, Stair training, Vestibular training, Canalith repositioning, Orthotic/Fit training, DME instructions, Aquatic Therapy, Dry Needling, Electrical stimulation, Spinal mobilization, Cryotherapy, Moist heat, Splintting, Taping, and Manual therapy  PLAN FOR NEXT SESSION: outdoor quad cane gait training including ramps, curbs, trial SPC? hip ABD strengthening     Janene Harvey, PT, DPT 10/18/22 11:54 AM  [facility] at [facility] [place], [place], [place] [address] Phone # [phone] Fax # [phone]

## 2022-10-18 ENCOUNTER — Ambulatory Visit: Payer: 59 | Admitting: Occupational Therapy

## 2022-10-18 ENCOUNTER — Encounter: Payer: Self-pay | Admitting: Physical Therapy

## 2022-10-18 ENCOUNTER — Ambulatory Visit: Payer: 59

## 2022-10-18 ENCOUNTER — Ambulatory Visit: Payer: 59 | Admitting: Physical Therapy

## 2022-10-18 DIAGNOSIS — R482 Apraxia: Secondary | ICD-10-CM | POA: Diagnosis not present

## 2022-10-18 DIAGNOSIS — R4701 Aphasia: Secondary | ICD-10-CM

## 2022-10-18 DIAGNOSIS — M6281 Muscle weakness (generalized): Secondary | ICD-10-CM

## 2022-10-18 DIAGNOSIS — R2689 Other abnormalities of gait and mobility: Secondary | ICD-10-CM

## 2022-10-18 DIAGNOSIS — I69351 Hemiplegia and hemiparesis following cerebral infarction affecting right dominant side: Secondary | ICD-10-CM

## 2022-10-18 DIAGNOSIS — R278 Other lack of coordination: Secondary | ICD-10-CM

## 2022-10-18 DIAGNOSIS — R262 Difficulty in walking, not elsewhere classified: Secondary | ICD-10-CM

## 2022-10-18 DIAGNOSIS — R2681 Unsteadiness on feet: Secondary | ICD-10-CM

## 2022-10-18 DIAGNOSIS — R208 Other disturbances of skin sensation: Secondary | ICD-10-CM

## 2022-10-18 NOTE — Therapy (Signed)
OUTPATIENT SPEECH LANGUAGE PATHOLOGY TREATMENT   Patient Name: Gordon Fisher MRN: [id number] DOB:10/16/1961, 61 y.o., male Today's Date: 10/18/2022  PCP: TBD REFERRING PROVIDER: Jennye Boroughs, MD    End of Session - 10/18/22 1054     Visit Number 14    Number of Visits 25    Date for SLP Re-Evaluation 10/24/22    Authorization - Visit Number 84    Authorization - Number of Visits 20    SLP Start Time [phone]   late   SLP Stop Time  1016    SLP Time Calculation (min) 38 min    Activity Tolerance Patient tolerated treatment well                    Past Medical History:  Diagnosis Date   Stroke [facility])    Past Surgical History:  Procedure Laterality Date   BUBBLE STUDY  02/15/2022   Procedure: BUBBLE STUDY;  Surgeon: Werner Lean, MD;  Location: [place];  Service: Cardiovascular;;   IR CT HEAD LTD  02/11/2022   IR CT HEAD LTD  02/11/2022   IR CT HEAD LTD  02/11/2022   IR PERCUTANEOUS ART THROMBECTOMY/INFUSION INTRACRANIAL INC DIAG ANGIO  02/11/2022   IR PERCUTANEOUS ART THROMBECTOMY/INFUSION INTRACRANIAL INC DIAG ANGIO  02/11/2022   IR US GUIDE VASC ACCESS RIGHT  02/11/2022   IR US GUIDE VASC ACCESS RIGHT  02/11/2022   RADIOLOGY WITH ANESTHESIA N/A 02/11/2022   Procedure: IR WITH ANESTHESIA;  Surgeon: Radiologist, Medication, MD;  Location: [place];  Service: Radiology;  Laterality: N/A;   RADIOLOGY WITH ANESTHESIA N/A 02/11/2022   Procedure: RADIOLOGY WITH ANESTHESIA;  Surgeon: Radiologist, Medication, MD;  Location: [place];  Service: Radiology;  Laterality: N/A;   TEE WITHOUT CARDIOVERSION N/A 02/15/2022   Procedure: TRANSESOPHAGEAL ECHOCARDIOGRAM (TEE);  Surgeon: Werner Lean, MD;  Location: [facility] END[facility];  Service: Cardiovascular;  Laterality: N/A;   Patient Active Problem List   Diagnosis Date Noted   Hypocalcemia 03/22/2022   DVT, lower extremity, distal ([place]) 03/22/2022   Obesity 03/22/2022   Elevated LDL cholesterol level 03/22/2022    Acute ischemic left middle cerebral artery (MCA) stroke ([place]) 02/18/2022   Cerebrovascular accident (CVA) due to occlusion of left middle cerebral artery ([place]) 02/11/2022    ONSET DATE: 02-11-22   REFERRING DIAG: I63.9 (ICD-10-CM) - Cerebrovascular accident (CVA), unspecified mechanism ([place]) R13.10 (ICD-10-CM) - Dysphagia, unspecified type   THERAPY DIAG:  No diagnosis found.  Rationale for Evaluation and Treatment Rehabilitation  SUBJECTIVE:   SUBJECTIVE STATEMENT: "Yeah."  Pt accompanied by: self  PERTINENT HISTORY: [name]. Castille is a 61 year old male in relatively good health who was admitted via [facility] on 02/11/22 with right sided weakness, right facial droop and slurred speech. CTH showed L-MCA hyperdense sign and he received TNK prior to transfer. CTA showed L-MCA MI occlusion and he underwent cerebral angio with thrombectomy and TICI 3 flow by Dr. Nelida Gores [name].  Follow up MRI brain done revealing small areas of acute infarct infarct in L-MCA territory with mild hemorrhagic transformation in left parietal lobe and L-MCA bifurcation residual vascular thrombus. He was taken back for thrombectomy with stent for dissection but had intra-stent occlusion which was unable to be recanalized. Post op CT showed small SAH which was stable on follow up. He was started on ASA/Brillinta. MBS 04/18 showed oral dysphagia with oral delay and no penetration or aspiration. He was started on D3, thins with supervision. Repeat MRI brain on 04/16  showed extensive evolving acute ischemic L-MCA occlusion increased in size with associated edema, few additional small acute cortical subcortical infarcts involving contralateral right frontal lobe and left ACA distribution. Stroke felt to be due to unknown etiology and wife elected on Zio patch on discharge to rule out A fib. 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 [name] was d/c'd from [facility] on 03-19-22, and has had HHST since that time until last week.   PAIN:  Are you having pain? No  PATIENT GOALS Pt indicated he would like talking to improve  OBJECTIVE:  DIAGNOSTIC FINDINGS: MRI without contrast 02/13/22 IMPRESSION: 1. Extensive evolving acute ischemic left MCA distribution infarct, increased in size as compared to previous MRI from 02/11/2022. Associated edema without significant midline shift. 2. Small volume acute subarachnoid hemorrhage at the posterior aspect of the left Sylvian fissure, stable. 3. Few additional small volume acute ischemic cortical and subcortical infarcts involving the contralateral right frontal lobe and left ACA distribution as above. 4. Underlying chronic microvascular ischemic disease.    PATIENT REPORTED OUTCOME MEASURES (PROM): Communication Effectiveness Survey: Pt scored 8/32 - higher scores indicate more effective communication/QOL.   TODAY'S TREATMENT:  10/18/22: [name] made some icons last week. Realized she had duplicated them when she saw them on another page and realized she had put some icons on the wrong page. [name] is becoming more independent with deleting icons and is independent with moving icons up and down the page. He still requires occasional min A with adding and modifying icons but is improving with this as his exposure with the process continues. Pt currently without internet so he cannot search for icons or back up device at home.   10/11/22: SLP began educating pt/wife how to create icons using camera, altered pronunciation text field for "leesa", and searching online for photo. By session end pt was accessing orange three-bar icon in upper right hand corner independently when cued "what do you press to modify or add icons?", and moving icons independently. Pt and wife to make icons for family, and use for communicating food choices.  10-08-22: Lingraphica start trial date today. SLP  introduced device to pt/wife. Pt with functional verbalization within 3 seconds of initiation for "ham", "tomato", "cheese", "[name]", and "sandwich". Pt produced "sandwich" spontaneously after 8 seconds and mutliple attempts, without label. SLP introduced how to move, add, and delete icons. Pt communicated his sandwich choice ("sandwich meat" originally, and then "ham" after it was added to page), and toppings choices (tomato, cheese) with consistent min-mod cues, faded to occasional min-mod cues. Pt and wife to use device over the weekend for food - - SLP to assist/teach pt and [name] how to add pictures to icons for family members next session.  09/27/22: Lingraphica not yet sent from [facility]. Should be here by pt's next appointment.  [name] told SLP names of people at their Thanksgiving celebration, with usual mod to max assist. Visual cues were most effective on pt's most difficult words. SLP assessed pt's reading comprehension - 80% with 4-5 words.   11/8/23[name] stated they had been busy this week with appointments but still work at practicing some. SLP initiated a 20-minute discussion today with Evette Doffing, and SLP explaining speech generating device in depth, showing [name] and [name] options for trial device and deciding on the Lingraphica Touch Talk. SLP to make referral for this in the next few days.  Family names were written  with mod-max A usually - pt got first 3 letters correct with childrens and grandchildrens' names. SLP showed [name] how to break up daughter's name to [id number] to encourage brain's processing in a different way in order to foster new neural pathways to say the name.  09/06/22: SLP targeted verbal expression with picture naming and divergent naming. Pt notably demonstrates he knows the word/s he wants to say but is finding it difficult to pronounce them (verbal apraxia). Pt's expression improves with visual cues (oral cues from SLP for initial phoneme/sound) - 75% successful even  without sound necessary - just visual cue for articulator placement.   09/01/22: SLP worked with pt's verbal expression and auditory comprehension today. Simple conversation/sentences were understood 90% of the time, 100% with one repeat by speaker. With opposites/paired words, pt had 70% success, improved to 90% with min A. With cloze phrases/sentences pt's response accuracy improved with 1 syllable words, but overall success was 17% with min A, improved to 50% with mod-max A. Often, pt knew the response but knew he was going to misarticulate the response. Written cues were helpful for pt here. SLP encouraged wife to cont with cloze phrases and paired words at home.  10/25/23[name] brought pt's cards today and SLP reviewed them with pt, modleing to wife cueing methods and hierarchy.Pt functionally said 6/7 salient objects (without written words) with mod-max cues, and 1/7 spontaneously. He said family names with written cues 6/7 (functionally), and 1/7 wth max cues Janett Billow"). Pt demo'd understnading of directions in context 75% today. SLP introduce possibility of Lingraphica to pt and wife and educated about studies proving that AAC assists verbal communication and that pt may not have to have assistance with a AAC device but we will know more as time progresses.   08/23/22: SLP targeted verbal expression with pt today. Heavy perseveration with "[name]" grandson's name for all J names. SLP even did months of the year and pt cont to have perseveration with [name] for all J names. "[name]" - pt cont to make this into two syllables 90% of the time, with consistent max A. Pt used multi-modal communization using chicken clucks with SLP assistance and encouragement when pt could not say "chickens", and pointed to ages of his grandchildren with SLP writing numbers 1-10. Mod-max A needed for grandson 2 months old. Pt showed 1 and then gestured taking a short person's height for SLP to know young child. MAX A needed  for month names > 1 syllable. With one syllable months pt 80% success. Pt told SLP name of his truck, and functionally told SLP name of one of his two tractors. Req'd mod/max A otherwise. Pt to cont to practice salient/high frequency/high importance words.   08/18/22: SLP targeted spelling today with pt - 3 letter words 100%, 4-letter words 80% - [name] stated pt had s/sx dyslexia prior to CVA. SLP to monitor this. SLP provided some other hints/helpful suggestions for home practice and communication at home today. SLP noted pt read 3 and 4 letter words 50% accuracy when spelled correctly.   08/16/22: SLP assessed pt's reading comprehension - pt reading single words 6/6 and simple sentences of 6-9 words ("the boy is riding his bike in the park") 6/6. [name] said pt having difficulty reading newspaper from home. SLP did reading task (newspaper) with pt today. SLP highlighted key words for pt and he answered 4/6 questions correct by pt answering yes/no and pointing to correct portion in the article to indicate place where the answer  was found. Pt will return CES next session or two.    PATIENT EDUCATION: Education details: see "Today's therapy" Person educated: Patient and Spouse Education method: Explanation and Demonstration Education comprehension: verbalized understanding and needs further education   GOALS: Goals reviewed with patient? Yes, in general  SHORT TERM GOALS: Target date: 08/31/2022 extended one week due to visits  Pt will name 6 salient family names when given picture and written name with consistent mod-max cues in 3 sessions (precursor for speech generating device) Baseline:08-25-22. 09-01-22 Goal status: partially met  2.  Pt will produce simple phoneme/ bilabial+/a/  /a/, /ba/, /ma/, and/or /pa/ simultaneously with SLP 65% success (equal # reps/stimulus) in 2 sessions Baseline:  Goal status: deferred - pt to focus on salient items/terms  3.  Pt will demo understanding of  simple commands in context 80% over three sessions Baseline: 08-25-22, 09-01-22 Goal status: Partially met  4.  Pt will indicate understanding of a complex sentence 85% of the time in 3 sessions (precursor for speech generating device) Baseline:  Goal status: not met  5.  Pt will copy written family names with 50% success in two sessions  Baseline:  Goal Status: met   LONG TERM GOALS: Target date: 10/24/2022  (or 20th visit)  Pt will name 6 salient family names when given picture and written name with consistent mod cues in 3 sessions (precursor for speech generating device) Baseline:  Goal status: Ongoing  2.  Pt will IMITATE simple phoneme/bilabial+/a/  /a/, /ba/, /ma/, and/or /pa/  65% success (equal # reps/stimulus) in 2 sessions Baseline:  Goal status: deferred - working with pt on more salient words  3.  Pt will generate meaningful message understood by [name] using speech generating device 60% of the time in 3 sessions Baseline:  Goal status: Ongoing  4.  [name] will indicate that spontaneous communication is easier than during week of evaluation Baseline:  Goal status: Ongoing   ASSESSMENT:  CLINICAL IMPRESSION: He is a 61 y.o. male who cont to be seen today for treatment of verbal apraxia (as pt often indicates he knows the response but knows it will not be articulated correctly) and aphasia. [place] education continues today. SEE [place] NOTE from today for more details.   OBJECTIVE IMPAIRMENTS include expressive language, receptive language, and aphasia. These impairments are limiting patient from return to work, managing medications, managing appointments, household responsibilities, ADLs/IADLs, and effectively communicating at home and in community. Factors affecting potential to achieve goals and functional outcome are ability to learn/carryover information and severity of impairments. Patient will benefit from skilled SLP services to address above impairments and  improve overall function.  REHAB POTENTIAL: Good  PLAN: SLP FREQUENCY: 2x/week  SLP DURATION: 12 weeks  PLANNED INTERVENTIONS: Language facilitation, Environmental controls, Cueing hierachy, Internal/external aids, Functional tasks, and Multimodal communication approach    Kordae Buonocore, CCC-SLP 10/18/2022, 10:55 AM

## 2022-10-18 NOTE — Therapy (Signed)
OUTPATIENT OCCUPATIONAL THERAPY NEURO TREATMENT NOTE  Patient Name: Gordon Fisher MRN: [id number] DOB:1961/07/02, 61 y.o., male Today's Date: 10/18/2022  PCP: none on file REFERRING PROVIDER: Jennye Boroughs, MD    OT End of Session - 10/18/22 1102     Visit Number 18    Number of Visits 30    Date for OT Re-Evaluation 11/19/22    Authorization Type [employer]    Authorization Time Period VL: 60 combined (OT/PT/ST)    Authorization - Visit Number 18    Authorization - Number of Visits 20    OT Start Time 1102    OT Stop Time 1145    OT Time Calculation (min) 43 min    Activity Tolerance Patient tolerated treatment well    Behavior During Therapy WFL for tasks assessed/performed                      Past Medical History:  Diagnosis Date   Stroke [facility])    Past Surgical History:  Procedure Laterality Date   BUBBLE STUDY  02/15/2022   Procedure: BUBBLE STUDY;  Surgeon: Werner Lean, MD;  Location: [place];  Service: Cardiovascular;;   IR CT HEAD LTD  02/11/2022   IR CT HEAD LTD  02/11/2022   IR CT HEAD LTD  02/11/2022   IR PERCUTANEOUS ART THROMBECTOMY/INFUSION INTRACRANIAL INC DIAG ANGIO  02/11/2022   IR PERCUTANEOUS ART THROMBECTOMY/INFUSION INTRACRANIAL INC DIAG ANGIO  02/11/2022   IR US GUIDE VASC ACCESS RIGHT  02/11/2022   IR US GUIDE VASC ACCESS RIGHT  02/11/2022   RADIOLOGY WITH ANESTHESIA N/A 02/11/2022   Procedure: IR WITH ANESTHESIA;  Surgeon: Radiologist, Medication, MD;  Location: [employer];  Service: Radiology;  Laterality: N/A;   RADIOLOGY WITH ANESTHESIA N/A 02/11/2022   Procedure: RADIOLOGY WITH ANESTHESIA;  Surgeon: Radiologist, Medication, MD;  Location: [place];  Service: Radiology;  Laterality: N/A;   TEE WITHOUT CARDIOVERSION N/A 02/15/2022   Procedure: TRANSESOPHAGEAL ECHOCARDIOGRAM (TEE);  Surgeon: Werner Lean, MD;  Location: [facility] ENDOSCOPY;  Service: Cardiovascular;  Laterality: N/A;   Patient Active Problem List    Diagnosis Date Noted   Hypocalcemia 03/22/2022   DVT, lower extremity, distal ([place]) 03/22/2022   Obesity 03/22/2022   Elevated LDL cholesterol level 03/22/2022   Acute ischemic left middle cerebral artery (MCA) stroke ([place]) 02/18/2022   Cerebrovascular accident (CVA) due to occlusion of left middle cerebral artery ([place]) 02/11/2022    ONSET DATE: referral 07/15/22 (CVA 02/11/22)  REFERRING DIAG: I63.9 (ICD-10-CM) - Cerebrovascular accident (CVA), unspecified mechanism ([place]) R13.10 (ICD-10-CM) - Dysphagia, unspecified type   THERAPY DIAG:  Hemiplegia and hemiparesis following cerebral infarction affecting right dominant side ([place])  Other lack of coordination  Other disturbances of skin sensation  Muscle weakness (generalized)  Other abnormalities of gait and mobility  Rationale for Evaluation and Treatment Rehabilitation  SUBJECTIVE:   SUBJECTIVE STATEMENT: Pt reports no pain. 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 No  FALLS: Has patient fallen in last 6 months? Yes. Number of falls 1  PLOF: Independent, Independent with basic ADLs, and Independent with gait  PATIENT GOALS: continue to make gains, independence with getting dressed   OBJECTIVE:   HAND DOMINANCE: Right  FUNCTIONAL OUTCOME MEASURES: FOTO: 11  UPPER EXTREMITY ROM    Active ROM: Pt with no AROM during  evaluation.  Pt was able to elicit min shoulder elevation and slight gross grasp when cued and provided increased time to initiate movement.   09/22/22: Pt still with no AROM in upright position, is able to elicit slight internal rotation and mod elbow flexion/extension in gravity assisted position.  Passive ROM Right Eval - 9/25  Shoulder flexion 100  Shoulder abduction   Shoulder adduction   Shoulder extension   Shoulder internal  rotation WNL  Shoulder external rotation onset of pain >5-10* from neutral  Middle trapezius   Lower trapezius   Elbow flexion WNL  Elbow extension WNL  Wrist flexion WNL  Wrist extension WNL  Wrist ulnar deviation   Wrist radial deviation   Wrist pronation   Wrist supination   (Blank rows = not tested)  HAND FUNCTION: No functional grasp. Trace gross grasp with increased time for initiation  SENSATION: Difficult to assess due to expressive aphasia  MUSCLE TONE: RUE: Mild and Hypertonic  ------------------------------------------------------------------------------------------------------------------------------------------------------------ (objective measures above completed at initial evaluation unless otherwise dated)  TODAY'S TREATMENT:  10/18/22 NMR: Reviewed precautions and parameters for use of NMES.  Protocol set up on [place] system,  with NMES applied to supraspinatus and middle deltoid to help approximate shoulder join to reduce sublux. with trace stimulation recognized however inconsistent.  OT changed pt positioning and positioning of electrodes with minimal carryover.  Discussed limitations due to required intensity with plan to attempt at a future session with supervisor present to continue to troubleshoot.    Ratio 1:3 Rate 35 pps Waveform- Asymmetric Ramp 1.0 Pulse 300 Intensity- 65 Duration -   5 min  Report of pain at the beginning of session  Report of pain at the end of session  No adverse reactions after treatment and is skin intact.   AAROM: closed chain movements with dowel and facilitation from OT at elbow for improved body mechanics. Completed chest press and shoulder flexion with dowel for improved symmetry and activation of RUE. Pt demonstrating improved ability to maintain grasp during movements.  Horizontal abduction/adduction with hand over hand to facilitate proper movement while decreasing trunk movements.  OT educated pt and spouse on  positioning, tactile cues, and to maintain good shoulder approximation during exercises and not let UE "hang". PROM/AAROM: internal/external rotation with OT providing facilitation and tapping.  Pt with decreased external rotation.  OT providing tactile cues to facilitate internal rotation with good body mechanics and positioning, as pt with tendency to compensate with internal rotation.  Pt demonstrating elbow flexion/extension within moderate range, lacking ~30* at either end of movement.  OT providing stretch and end ranges.   10/11/22 NMR: Supine NMR with focus on shoulder flexion and elbow flexion/extension.  Pt able to elicit elbow flexion and extension, however with use of compensatory internal rotation.  Completed exercise both from side as well as with shoulder in 90* flexion with support from OT to maintain position.  Pt utilizing increased internal rotation when attempting to activate shoulder flexion.  Pt achieving ~10* shoulder flexion with use of internal rotation and support from OT to decrease effect of gravity. NMR: Sidelying NMR with OT providing support at wrist and intermittent light tactile cues at elbow and torso to decrease compensatory trunk movements and internal rotation.  Pt able to progress from ~30* to ~45 * shoulder flexion in sidelying with multimodal cues and support to maintain shoulder in alignment. Pt able to recognize when beginning to "turn on" internal rotation by end of task, with ability to stop  compensatory movement and increase flexion a few more degrees. UE Ranger: pt completed with focus on shoulder flexion and elbow extension.  OT providing support at elbow to facilitate shoulder approximation due to subluxation.  Pt able to complete with improved control and no shoulder internal rotation in this position today.  OT reiterated focus on quality of movement and to decrease onset of internal rotation as able.   10/08/22 Reviewed precautions and parameters for use  of NMES.  Protocol set up on [place] system, however no stimulation produced from machine, therefore terminated task with plan to trouble shoot and attempt at future session. AAROM: shoulder shrugs and scapular retraction completed sitting in front of mirror for visual feedback.  Therapist rolled up shirt sleeve to allow pt to visualize R shoulder with movements.  Pt demonstrating min elevation. AAROM: closed chain movements with dowel and use of mirror for visual feedback. Completed chest press and shoulder flexion with dowel for improved symmetry and activation of RUE. OT wrapped RUE with coban to dowel to maintain grasp.  OT educated pt and spouse on positioning and to maintain good shoulder approximation during exercises and not let UE "hang".   PATIENT EDUCATION: Ongoing condition-specific education related to therapeutic interventions completed this session Person educated: Patient and Spouse Education method: Explanation, Demonstration, Tactile cues, Verbal cues, and Handouts Education comprehension: verbalized understanding and needs further education   HOME EXERCISE PROGRAM: Written exercises of shoulder and elbow in supine with cues for hand placement and facilitation.   Self-ROM handout from [facility] lab  [place] Access Code: [email or web address] URL: [email or web address] - Supine Shoulder Flexion AAROM with Hands Clasped  - 1 x daily - 1 sets - 10 reps - Seated Elbow Extension and Shoulder External Rotation AAROM at Table with Towel  - 1 x daily - 1 sets - 10 reps - Finger Extension with Wrist Extension Caregiver PROM  - 1 x daily - 1 sets - 10 reps  GOALS: Goals reviewed with patient? Yes  SHORT TERM GOALS:  Target date: 10/22/22 Pt and spouse will be independent with advanced PROM and self-ROM HEP for shoulder stability and ROM. Baseline: Goal status: Met - 10/18/22  2.  Pt and spouse will verbalize understanding of AE and/or task modifications as needed for fastening  shoes.  Baseline:  Goal status: IN Progress   LONG TERM GOALS: Target date 11/19/22  Patient will be able to incorporate RUE into UB/LB dressing task at stabilizer to gross assist level. Baseline: Goal status: IN Progress  2.   Pt will utilize RUE as gross assist during grooming tasks (such as washing hands and face). Baseline:  Goal status: IN Progress  3.   Pt and spouse will verbalize and/or  demonstrate understanding of advanced treatment strategies and implementation at home (NMES, WB) to facilitate increased motor recovery.  Baseline:  Goal status: IN Progress  4.   Pt will demonstrate ability to utilize BUE during mobility tasks to pushup from chair with BUE on arm rests.  Baseline:  Goal status: IN Progress  5.   Pt will demonstrate ability to don/doff footwear (to include socks and shoes) with supervision and use of AE PRN.  Baseline:  Goal status: IN Progress   ASSESSMENT:  CLINICAL IMPRESSION: Treatment session with focus on NMR with RUE. Pt continues to demonstrate improved awareness of body in space with mod fading to min cues due to aphasia and apraxia. Pt with decreased compensatory internal rotation during attempts at shoulder flexion and elbow extension/flexion, demonstrating  improvements in motor control even in upright position. Plan to begin implementing NMES during future session as cleared by cardiologist Dr. Lars Mage.  PERFORMANCE DEFICITS in functional skills including ADLs, IADLs, coordination, sensation, tone, ROM, strength, pain, flexibility, FMC, GMC, mobility, balance, body mechanics, vision, and UE functional use and psychosocial skills including environmental adaptation and routines and behaviors.   IMPAIRMENTS are limiting patient from ADLs and IADLs.   COMORBIDITIES may have co-morbidities  that affects occupational performance. Patient will benefit from skilled OT to address above impairments and improve overall function.   PLAN: OT  FREQUENCY: 1-2x/week  OT DURATION: 8 weeks  PLANNED INTERVENTIONS: self care/ADL training, therapeutic exercise, therapeutic activity, neuromuscular re-education, manual therapy, passive range of motion, balance training, functional mobility training, splinting, electrical stimulation, compression bandaging, moist heat, cryotherapy, patient/family education, visual/perceptual remediation/compensation, psychosocial skills training, energy conservation, coping strategies training, and DME and/or AE instructions  RECOMMENDED OTHER SERVICES: N/A  CONSULTED AND AGREED WITH PLAN OF CARE: Patient and family member/caregiver  PLAN FOR NEXT SESSION: Continue w/ scapular stability: shoulder shrugs, rolls, and scapular retraction; WB and dynamic standing, supine and sidelying NMR.  E-stim as cleared by MD.   Simonne Come, OTR/L 10/18/2022, 11:02 AM

## 2022-10-20 ENCOUNTER — Ambulatory Visit: Payer: 59

## 2022-10-27 ENCOUNTER — Ambulatory Visit: Payer: 59 | Admitting: Occupational Therapy

## 2022-10-27 ENCOUNTER — Ambulatory Visit: Payer: 59

## 2022-11-08 ENCOUNTER — Encounter: Payer: Self-pay | Admitting: Physical Medicine & Rehabilitation

## 2022-11-08 ENCOUNTER — Encounter: Payer: 59 | Attending: Physical Medicine & Rehabilitation | Admitting: Physical Medicine & Rehabilitation

## 2022-11-08 VITALS — BP 112/73 | HR 83 | Ht 68.0 in | Wt 174.0 lb

## 2022-11-08 DIAGNOSIS — I824Y1 Acute embolism and thrombosis of unspecified deep veins of right proximal lower extremity: Secondary | ICD-10-CM | POA: Diagnosis not present

## 2022-11-08 DIAGNOSIS — I63512 Cerebral infarction due to unspecified occlusion or stenosis of left middle cerebral artery: Secondary | ICD-10-CM | POA: Diagnosis not present

## 2022-11-08 DIAGNOSIS — M79621 Pain in right upper arm: Secondary | ICD-10-CM | POA: Diagnosis not present

## 2022-11-08 DIAGNOSIS — M21371 Foot drop, right foot: Secondary | ICD-10-CM | POA: Insufficient documentation

## 2022-11-08 NOTE — Progress Notes (Signed)
Subjective:    Patient ID: Gordon Fisher, male    DOB: 12/30/1960, 62 y.o.   MRN: [id number]  HPI   Brief HPI:   Gordon Fisher is a 62 y.o. male in relatively good health was admitted via [facility] on 02/11/2022 with right-sided weakness, right facial droop and slurred speech.  CT H showed left-MCA hyperdense sign and he reports sleep TNK prior to transfer to [facility].  CTA showed left-MCA M1 occlusion and he underwent cerebral angio with thrombectomy and T1C13 flow by Dr. Nelida Gores de Rodrigez.  Follow-up MRI brain done revealing small areas of acute infarct in left-MCA territory with mild hemorrhagic transformation in left parietal lobe and left-MCA bifurcation residual vascular thrombus.  He was taken back for thrombectomy with stent for dissection but had intra stent occlusion which was unable to be recanalized.  Postop CT showed small SAH which was stable on follow-up films.   He was started on ASA/Brilinta as well as tube feeds for nutritional support until MBS of 04/18 when he was advanced to D3 diet with supervision.  Repeat MRI brain 04/16 showed extensive evolving acute ischemic left-MCA occlusion increasing in size with associated edema, few additional small acute cortical/subcortical infarcts involving contralateral right frontal lobe and left ACA distribution.  Stroke felt to be due to unknown etiology and wife elected on Zio patch which was placed prior to discharge to rule out A-fib.  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  CIR was recommended due to functional decline.      Hospital Course: Gordon Fisher was admitted to rehab 02/18/2022 for inpatient therapies to consist of PT, ST and OT at least three hours five days a week. Past admission physiatrist, therapy team and rehab RN have worked together to provide customized collaborative inpatient rehab.  His blood pressures were monitored  on TID basis and has been controlled without medications. He was noted to have hypocalcemia with ionized calcium at 1.11 therefore Tums was added for calcium supplementation twice daily.  Check of labs at admission showed AKI which has resolved with serial checks.   He was noted to be febrile on 05/05 and UA done was positive therefore Rocephin was added for treatment of Enterobacter UTI.[name]  CXR was NAD. BLE Dopplers also ordered for work-up and showed acute right posterior tib/gastroc DVT.  This was monitored with repeat Dopplers 5/10 which showed persistent DVT and he was cleared to start Eliquis per discussion with Dr. Xu/neurology. ASA was discontinued and he was maintained on Eliquis and Brilinta during his stay.  Follow-up CBC shows reactive leukocytosis has resolved and H&H is stable. His blood sugars were initially monitored on ACHS basis due to hyperglycemia from tube feeds.  As intake improved, tube feeds were discontinued and he he is tolerating regular diet without difficulty.     His blood sugars were noted to be improved of tube feeds therefore CBG checks were discontinued and patient and family has been educated on heart healthy carb modified diet.  P.o. intake has been good and he is continent of bowel and bladder.  Right foot x-rays were done due to reports of pain and was negative for fracture.  He continues on Lipitor for elevated LDL.   He has made steady gains during his stay and requires min to mod assist at wheelchair level.  Patient declined extending length of stay and home health therapy is in process of being  set is awaiting responses regarding acceptance from couple more agencies.     Rehab course: During patient's stay in rehab weekly team conferences were held to monitor patient's progress, set goals and discuss barriers to discharge. At admission, patient required mod to total assist with basic ADL task and total assist with mobility.  He exhibited severe expressive and receptive  aphasia with verbal output but limited to yeah and required dysphagia 3 diet with full supervision for safety. He has had improvement in activity tolerance, balance, postural control as well as ability to compensate for deficits. He is able to dress with min to mod assist and requires mod assist for most ADL tasks.  He has had significant improvement in motor planning as well as decrease in right inattention.   He requires min assist for squat pivot transfers versus use of study.  He is able to perform car transfers with heavy min assist.  He requires min to mod assist for standing.  He is able to ambulate 100 feet with +2 mod assist with skilled therapists. He requires min assist for basic comprehension and mod to max assist for communicating via Monday model communication.  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  He is tolerating regular diet without signs or symptoms of aspiration.  Family education was completed with wife and sister-in-law.     Visit 08/06/22 Patient is a 62 year old male who recently completed CIR after he had a left MCA CVA.  He is here with his wife.  He is here for for follow-up after his CVA.  Patient has had some increased swelling in his right lower extremity over the past few weeks.  His foot has also had a darker color since this time.  This swelling  color improves with elevation of his leg.  [facility]  He continues to have shoulder pain however this is improved with Tylenol.  His wife reports he is doing well overall and has improved in his communication.  Wife reports he is planned for a loop recorder, she wants to know if patient will be able to use a TENS unit for his right shoulder, e-stim for the foot with therapy when he gets displaced in a few weeks.  Interval History Gordon Fisher is here for follow-up after his left MCA CVA.  His wife is accompanying him today.  They report  he is doing well overall.  He continues working with PT OT and SLP.  He is wearing an AFO for his right foot drop.  They report he has followed by the [facility] physicians.  They report cardiology is planning to keep him on Eliquis for lifelong treatment.  His shoulder pain continues to be improved.  His leg swelling has also resolved.  He is not having difficulty with swallowing.  Patient does have increased tone in his right upper extremity however he denies this causing him any pain or discomfort.  He also says the increased tone does not interfere with his function in any way.  Pain Inventory Average Pain 0 Pain Right Now 0 My pain is  no pain  LOCATION OF PAIN  no pain  BOWEL Number of stools per week: 7  BLADDER Normal    Mobility walk with assistance use a cane use a walker ability to climb steps?  yes do you drive?  no use a wheelchair transfers alone Do you have any goals  in this area?  yes  Function not employed: date last employed . I need assistance with the following:  feeding, dressing, bathing, meal prep, household duties, and shopping Do you have any goals in this area?  no  Neuro/Psych No problems in this area  Prior Studies Any changes since last visit?  no  Physicians involved in your care Any changes since last visit?  no   Family History  Problem Relation Age of Onset   Thyroid disease Mother    Social History   Socioeconomic History   Marital status: Married    Spouse name: [name]   Number of children: 2   Years of education: Not on file   Highest education level: Associate degree: occupational, Scientist, product/process development, or vocational program  Occupational History   Not on file  Tobacco Use   Smoking status: Never    Passive exposure: Never   Smokeless tobacco: Never  Vaping Use   Vaping Use: Never used  Substance and Sexual Activity   Alcohol use: Never   Drug use: Never   Sexual activity: Never  Other Topics Concern   Not on file  Social History  Narrative   04/27/22 lives with wife   Social Determinants of Health   Financial Resource Strain: Not on file  Food Insecurity: Not on file  Transportation Needs: Not on file  Physical Activity: Not on file  Stress: Not on file  Social Connections: Not on file   Past Surgical History:  Procedure Laterality Date   BUBBLE STUDY  02/15/2022   Procedure: BUBBLE STUDY;  Surgeon: Christell Constant, MD;  Location: [facility] ENDOSCOPY;  Service: Cardiovascular;;   IR CT HEAD LTD  02/11/2022   IR CT HEAD LTD  02/11/2022   IR CT HEAD LTD  02/11/2022   IR PERCUTANEOUS ART THROMBECTOMY/INFUSION INTRACRANIAL INC DIAG ANGIO  02/11/2022   IR PERCUTANEOUS ART THROMBECTOMY/INFUSION INTRACRANIAL INC DIAG ANGIO  02/11/2022   IR US GUIDE VASC ACCESS RIGHT  02/11/2022   IR US GUIDE VASC ACCESS RIGHT  02/11/2022   RADIOLOGY WITH ANESTHESIA N/A 02/11/2022   Procedure: IR WITH ANESTHESIA;  Surgeon: Radiologist, Medication, MD;  Location: [facility] OR;  Service: Radiology;  Laterality: N/A;   RADIOLOGY WITH ANESTHESIA N/A 02/11/2022   Procedure: RADIOLOGY WITH ANESTHESIA;  Surgeon: Radiologist, Medication, MD;  Location: [facility] OR;  Service: Radiology;  Laterality: N/A;   TEE WITHOUT CARDIOVERSION N/A 02/15/2022   Procedure: TRANSESOPHAGEAL ECHOCARDIOGRAM (TEE);  Surgeon: Christell Constant, MD;  Location: [facility] ENDOSCOPY;  Service: Cardiovascular;  Laterality: N/A;   Past Medical History:  Diagnosis Date   Stroke (HCC)    Ht 5\' 8"  (1.727 m)   BMI 28.04 kg/m   Opioid Risk Score:   Fall Risk Score:  `1  Depression screen [facility] 2/9     08/06/2022   11:49 AM 05/06/2022    1:07 PM 04/01/2022    1:38 PM  Depression screen PHQ 2/9  Decreased Interest 0 0 0  Down, Depressed, Hopeless 0 0 0  PHQ - 2 Score 0 0 0  Altered sleeping   0  Tired, decreased energy   0  Change in appetite   0  Feeling bad or failure about yourself    0  Trouble concentrating   0  Moving slowly or fidgety/restless   0  Suicidal thoughts   0   PHQ-9 Score   0      Review of Systems  All other systems reviewed and are negative.  Objective:   Physical Exam   Gen: no distress, normal appearing HEENT: oral mucosa pink and moist, NCAT Cardio: Reg rate Chest: normal effort, normal rate of breathing Abd: soft, non-distended Ext: RLE with mild edema, darker skin, peripheral pulses intact Psych: pleasant, normal affect Skin: intact Neuro: Aphasia primarily expressive. Follows commands.  Musculoskeletal: Strength 5 out of 5 left lower and left upper extremity Strength RUE 1/5 proximal to 2/5 distal Shoulder subluxation noted on the right Increased tone in RUE Walking with cane AFO RLE       Assessment & Plan:    Left MCA CVA  -Continue PT/OT/SLP -Continue AFO for R foot drop -Discussed increased tone in RUE, currently not causing pain or interfering with function, continue regular stretching exercises, continue to monitor, advised him to call if this worsens     2. R shoulder pain, suspect due to subluxation  -Continue occasional use of sling to prevent injury -Reports pain has improved    3. DVT right gastroc/peroneal -Pt reports his cardiologist continuing apixaban  indefinitely -Swelling has resolved

## 2022-11-11 ENCOUNTER — Ambulatory Visit (INDEPENDENT_AMBULATORY_CARE_PROVIDER_SITE_OTHER): Payer: 59

## 2022-11-11 DIAGNOSIS — I639 Cerebral infarction, unspecified: Secondary | ICD-10-CM

## 2022-11-12 LAB — CUP PACEART REMOTE DEVICE CHECK
Date Time Interrogation Session: 20240111220519
Implantable Pulse Generator Implant Date: 20230807
Pulse Gen Serial Number: 511016023

## 2022-11-12 NOTE — Progress Notes (Signed)
[facility] Summary Report / Loop Recorder

## 2022-11-16 ENCOUNTER — Ambulatory Visit: Payer: 59 | Admitting: Occupational Therapy

## 2022-11-16 ENCOUNTER — Ambulatory Visit: Payer: 59

## 2022-11-19 ENCOUNTER — Encounter: Payer: 59 | Admitting: Hematology

## 2022-11-23 ENCOUNTER — Ambulatory Visit: Payer: 59

## 2022-11-23 ENCOUNTER — Ambulatory Visit: Payer: 59 | Attending: Physical Medicine & Rehabilitation | Admitting: Occupational Therapy

## 2022-11-23 DIAGNOSIS — R262 Difficulty in walking, not elsewhere classified: Secondary | ICD-10-CM | POA: Insufficient documentation

## 2022-11-23 DIAGNOSIS — R208 Other disturbances of skin sensation: Secondary | ICD-10-CM | POA: Diagnosis present

## 2022-11-23 DIAGNOSIS — R2681 Unsteadiness on feet: Secondary | ICD-10-CM | POA: Insufficient documentation

## 2022-11-23 DIAGNOSIS — R2689 Other abnormalities of gait and mobility: Secondary | ICD-10-CM | POA: Diagnosis present

## 2022-11-23 DIAGNOSIS — R482 Apraxia: Secondary | ICD-10-CM | POA: Insufficient documentation

## 2022-11-23 DIAGNOSIS — M6281 Muscle weakness (generalized): Secondary | ICD-10-CM

## 2022-11-23 DIAGNOSIS — R4701 Aphasia: Secondary | ICD-10-CM | POA: Insufficient documentation

## 2022-11-23 DIAGNOSIS — I69351 Hemiplegia and hemiparesis following cerebral infarction affecting right dominant side: Secondary | ICD-10-CM

## 2022-11-23 DIAGNOSIS — R278 Other lack of coordination: Secondary | ICD-10-CM

## 2022-11-23 NOTE — Therapy (Signed)
OUTPATIENT OCCUPATIONAL THERAPY NEURO TREATMENT NOTE  Patient Name: Gordon Fisher MRN: [id number] DOB:Mar 10, 1961, 61 y.o., male Today's Date: 11/23/2022  PCP: none on file REFERRING PROVIDER: Jennye Boroughs, MD    OT End of Session - 11/23/22 1627     Visit Number 19    Number of Visits 30    Date for OT Re-Evaluation 01/21/23    Authorization Type [employer]    Authorization Time Period VL: 60 combined (OT/PT/ST)    Authorization - Number of Visits 20    OT Start Time 1453    OT Stop Time 1533    OT Time Calculation (min) 40 min    Activity Tolerance Patient tolerated treatment well    Behavior During Therapy WFL for tasks assessed/performed                       Past Medical History:  Diagnosis Date   Stroke [facility])    Past Surgical History:  Procedure Laterality Date   BUBBLE STUDY  02/15/2022   Procedure: BUBBLE STUDY;  Surgeon: Werner Lean, MD;  Location: [place];  Service: Cardiovascular;;   IR CT HEAD LTD  02/11/2022   IR CT HEAD LTD  02/11/2022   IR CT HEAD LTD  02/11/2022   IR PERCUTANEOUS ART THROMBECTOMY/INFUSION INTRACRANIAL INC DIAG ANGIO  02/11/2022   IR PERCUTANEOUS ART THROMBECTOMY/INFUSION INTRACRANIAL INC DIAG ANGIO  02/11/2022   IR US GUIDE VASC ACCESS RIGHT  02/11/2022   IR US GUIDE VASC ACCESS RIGHT  02/11/2022   RADIOLOGY WITH ANESTHESIA N/A 02/11/2022   Procedure: IR WITH ANESTHESIA;  Surgeon: Radiologist, Medication, MD;  Location: [place];  Service: Radiology;  Laterality: N/A;   RADIOLOGY WITH ANESTHESIA N/A 02/11/2022   Procedure: RADIOLOGY WITH ANESTHESIA;  Surgeon: Radiologist, Medication, MD;  Location: [place];  Service: Radiology;  Laterality: N/A;   TEE WITHOUT CARDIOVERSION N/A 02/15/2022   Procedure: TRANSESOPHAGEAL ECHOCARDIOGRAM (TEE);  Surgeon: Werner Lean, MD;  Location: [facility];  Service: Cardiovascular;  Laterality: N/A;   Patient Active Problem List   Diagnosis Date Noted    Hypocalcemia 03/22/2022   DVT, lower extremity, distal ([place]) 03/22/2022   Obesity 03/22/2022   Elevated LDL cholesterol level 03/22/2022   Acute ischemic left middle cerebral artery (MCA) stroke ([place]) 02/18/2022   Cerebrovascular accident (CVA) due to occlusion of left middle cerebral artery ([place]) 02/11/2022    ONSET DATE: referral 07/15/22 (CVA 02/11/22)  REFERRING DIAG: I63.9 (ICD-10-CM) - Cerebrovascular accident (CVA), unspecified mechanism ([place]) R13.10 (ICD-10-CM) - Dysphagia, unspecified type   THERAPY DIAG:  Hemiplegia and hemiparesis following cerebral infarction affecting right dominant side ([place])  Other lack of coordination  Muscle weakness (generalized)  Other abnormalities of gait and mobility  Other disturbances of skin sensation  Rationale for Evaluation and Treatment Rehabilitation  SUBJECTIVE:   SUBJECTIVE STATEMENT: Pt's spouse reports that he has been more steady with walking, has been able to go up and down steps into basement with addition of more rails.  Pt's spouse reports that he has been able to move arm some.   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 No  FALLS: Has patient fallen in last 6 months? Yes. Number of falls 1  PLOF: Independent, Independent with basic ADLs, and Independent with gait  PATIENT GOALS: continue to make gains, independence  with getting dressed   OBJECTIVE:   HAND DOMINANCE: Right  FUNCTIONAL OUTCOME MEASURES: FOTO: 11  UPPER EXTREMITY ROM    Active ROM: Pt with no AROM during evaluation.  Pt was able to elicit min shoulder elevation and slight gross grasp when cued and provided increased time to initiate movement.   09/22/22: Pt still with no AROM in upright position, is able to elicit slight internal rotation and mod elbow flexion/extension in gravity  assisted position. 7/62/83: Pt eliciting trace shoulder flexion, 44* elbow flexion, -16* elbow extension, and able to elicit thumb extension with shoulder internal rotation.  Pt demonstrating ~30% of scapular retraction and elevation with intermitent tactile cues.     Passive ROM Right Eval - 9/25 Right 11/23/22  Shoulder flexion 100 120  Shoulder abduction  95  Shoulder adduction    Shoulder extension    Shoulder internal rotation WNL Unable to tolerate reaching past torso towards back pocket  Shoulder external rotation onset of pain >5-10* from neutral Onset of pain > 30*  Middle trapezius    Lower trapezius    Elbow flexion WNL WNL  Elbow extension WNL WNL  Wrist flexion WNL WNL  Wrist extension WNL WNL  Wrist ulnar deviation    Wrist radial deviation    Wrist pronation    Wrist supination    (Blank rows = not tested)  HAND FUNCTION: 11/23/22: loose gross grasp with increased time for initiation, unable to release grasp  SENSATION: Difficult to assess due to expressive aphasia  MUSCLE TONE: RUE: Mild and Hypertonic  ------------------------------------------------------------------------------------------------------------------------------------------------------------ (objective measures above completed at initial evaluation unless otherwise dated)  TODAY'S TREATMENT: 11/23/22 NMR:  Sitting: engaged in scapular retraction and shoulder elevation in sitting on mat with min tactile cues and intermittent tapping at shoulder to facilitate increased activation.  Pt able to complete ~30% of movement with above cues.  Pt benefiting from increased time and cues to go slow to allow for initiation of shoulder/scapular movement on R. Supine: pt able to elicit abduction in supine with increased time and tactile cues.  OT providing facilitation for improved alignment and cues to visually attend to UE during movements.  Pt tolerating PROM for assessment with improved tolerance to shoulder  flexion, still limited with abduction due to pain and onset of tone. Standing: pt able to achieve 44* elbow flexion in standing, incorporating min internal rotation at shoulder and controlled breathing.  However able to replicate with cues to focus on breathing during movements.  OT educated on importance of increased focus on AAROM/PROM of extension as pt is developing flexor synergy pattern movement.   AAROM: Utilized UE ranger and then transitioning to therapist supported movement with focus on motor control and decreasing shoulder internal rotation.  OT providing tactile cues to decrease internal rotation with good body mechanics and positioning, as pt with tendency to compensate with internal rotation. Pt able to recognize onset of compensatory movements with min cues and able to make attempts at correcting.  OT providing stretch at end ranges.    10/18/22 NMR: Reviewed precautions and parameters for use of NMES.  Protocol set up on [place] system,  with NMES applied to supraspinatus and middle deltoid to help approximate shoulder join to reduce sublux. with trace stimulation recognized however inconsistent.  OT changed pt positioning and positioning of electrodes with minimal carryover.  Discussed limitations due to required intensity with plan to attempt at a future session with supervisor present to continue to troubleshoot.    Ratio  1:3 Rate 35 pps Waveform- Asymmetric Ramp 1.0 Pulse 300 Intensity- 65 Duration -   5 min  Report of pain at the beginning of session  Report of pain at the end of session  No adverse reactions after treatment and is skin intact.   AAROM: closed chain movements with dowel and facilitation from OT at elbow for improved body mechanics. Completed chest press and shoulder flexion with dowel for improved symmetry and activation of RUE. Pt demonstrating improved ability to maintain grasp during movements.  Horizontal abduction/adduction with hand over hand to  facilitate proper movement while decreasing trunk movements.  OT educated pt and spouse on positioning, tactile cues, and to maintain good shoulder approximation during exercises and not let UE "hang". PROM/AAROM: internal/external rotation with OT providing facilitation and tapping.  Pt with decreased external rotation.  OT providing tactile cues to facilitate internal rotation with good body mechanics and positioning, as pt with tendency to compensate with internal rotation.  Pt demonstrating elbow flexion/extension within moderate range, lacking ~30* at either end of movement.  OT providing stretch and end ranges.   10/11/22 NMR: Supine NMR with focus on shoulder flexion and elbow flexion/extension.  Pt able to elicit elbow flexion and extension, however with use of compensatory internal rotation.  Completed exercise both from side as well as with shoulder in 90* flexion with support from OT to maintain position.  Pt utilizing increased internal rotation when attempting to activate shoulder flexion.  Pt achieving ~10* shoulder flexion with use of internal rotation and support from OT to decrease effect of gravity. NMR: Sidelying NMR with OT providing support at wrist and intermittent light tactile cues at elbow and torso to decrease compensatory trunk movements and internal rotation.  Pt able to progress from ~30* to ~45 * shoulder flexion in sidelying with multimodal cues and support to maintain shoulder in alignment. Pt able to recognize when beginning to "turn on" internal rotation by end of task, with ability to stop compensatory movement and increase flexion a few more degrees. UE Ranger: pt completed with focus on shoulder flexion and elbow extension.  OT providing support at elbow to facilitate shoulder approximation due to subluxation.  Pt able to complete with improved control and no shoulder internal rotation in this position today.  OT reiterated focus on quality of movement and to decrease  onset of internal rotation as able.  PATIENT EDUCATION: Ongoing condition-specific education related to therapeutic interventions completed this session Person educated: Patient and Spouse Education method: Explanation, Demonstration, Tactile cues, Verbal cues, and Handouts Education comprehension: verbalized understanding and needs further education   HOME EXERCISE PROGRAM: Written exercises of shoulder and elbow in supine with cues for hand placement and facilitation.   Self-ROM handout from [facility] lab  [place] Access Code: [email or web address] URL: [email or web address] - Supine Shoulder Flexion AAROM with Hands Clasped  - 1 x daily - 1 sets - 10 reps - Seated Elbow Extension and Shoulder External Rotation AAROM at Table with Towel  - 1 x daily - 1 sets - 10 reps - Finger Extension with Wrist Extension Caregiver PROM  - 1 x daily - 1 sets - 10 reps  GOALS: Goals reviewed with patient? Yes  SHORT TERM GOALS:  Target date: 10/22/22 Pt and spouse will be independent with advanced PROM and self-ROM HEP for shoulder stability and ROM. Baseline: Goal status: Met - 10/18/22  2.  Pt and spouse will verbalize understanding of AE and/or task modifications as needed for fastening shoes.  Baseline:  Goal status: Not met  NEW Short Term Goals: Target date: 12/24/22 Pt and spouse will be independent with advanced PROM and AAROM HEP for shoulder stability and shoulder and elbow ROM. Baseline: Goal status: INITIAL  2.  Pt and spouse will verbalize understanding of AE and/or task modifications as needed for fastening shoes.  Baseline:  Goal status: INITIAL   LONG TERM GOALS: Target date 11/19/22  Patient will be able to incorporate RUE into UB/LB dressing task at stabilizer to gross assist level. Baseline: Goal status: Not met  2.   Pt will utilize RUE as gross assist during grooming tasks (such as washing hands and face). Baseline:  Goal status: Not met  3.   Pt and spouse will  verbalize and/or  demonstrate understanding of advanced treatment strategies and implementation at home (NMES, WB) to facilitate increased motor recovery.  Baseline:  Goal status: Not met  4.   Pt will demonstrate ability to utilize BUE during mobility tasks to pushup from chair with BUE on arm rests.  Baseline:  Goal status: Not met  5.   Pt will demonstrate ability to don/doff footwear (to include socks and shoes) with supervision and use of AE PRN.  Baseline:  Goal status: Not met  NEW Long term goals: Target date: 01/21/23 Patient will be able to incorporate RUE into UB/LB dressing task at stabilizer to gross assist level. Baseline: Goal status: INITIAL  2.   Pt will utilize RUE as gross assist during grooming tasks (such as washing hands and face). Baseline:  Goal status: INITIAL  3.   Pt and spouse will verbalize and/or  demonstrate understanding of advanced treatment strategies and implementation at home (NMES, WB) to facilitate increased motor recovery.  Baseline:  Goal status: INITIAL  4.   Pt will demonstrate ability to utilize BUE during mobility tasks to pushup from chair with BUE on arm rests.  Baseline:  Goal status: INITIAL  5.   Pt will demonstrate ability to don/doff footwear (to include socks and shoes) with supervision and use of AE PRN.  Baseline:  Goal status: INITIAL  ASSESSMENT:  CLINICAL IMPRESSION: Pt has not been seen since 10/18/22 due to sickness/holiday/conflicting appointments.  OT reviewed previous goals and measurements, with pt demonstrating increased elbow flexion with min-mod compensatory patterns. Due to progress in activation of RUE movements, pt would benefit from resumption of OT services with focus on RUE ROM and functional integration into ADLs.  Treatment session with focus on NMR with RUE.  Pt continues to demonstrate improved awareness of body in space with mod fading to min cues due to aphasia and apraxia. Pt with decreased compensatory  internal rotation during attempts at shoulder flexion and elbow extension/flexion, demonstrating improvements in motor control even in upright position. Plan to begin implementing NMES during future sessions as cleared by cardiologist Dr. Lars Mage.  PERFORMANCE DEFICITS in functional skills including ADLs, IADLs, coordination, sensation, tone, ROM, strength, pain, flexibility, FMC, GMC, mobility, balance, body mechanics, vision, and UE functional use and psychosocial skills including environmental adaptation and routines and behaviors.   IMPAIRMENTS are limiting patient from ADLs and IADLs.   COMORBIDITIES may have co-morbidities  that affects occupational performance. Patient will benefit from skilled OT to address above impairments and improve overall function.   PLAN: OT FREQUENCY: 1-2x/week  OT DURATION: 8 weeks  PLANNED INTERVENTIONS: self care/ADL training, therapeutic exercise, therapeutic activity, neuromuscular re-education, manual therapy, passive range of motion, balance training, functional mobility training, splinting, electrical stimulation, compression bandaging,  moist heat, cryotherapy, patient/family education, visual/perceptual remediation/compensation, psychosocial skills training, energy conservation, coping strategies training, and DME and/or AE instructions  RECOMMENDED OTHER SERVICES: N/A  CONSULTED AND AGREED WITH PLAN OF CARE: Patient and family member/caregiver  PLAN FOR NEXT SESSION: Continue w/ scapular stability: shoulder shrugs, rolls, and scapular retraction; WB and dynamic standing, supine and sidelying NMR.  Increased focus on incorporation of RUE into functional tasks.  E-stim as cleared by MD.   Rosalio Loud, OTR/L 11/23/2022, 4:28 PM

## 2022-11-23 NOTE — Therapy (Signed)
OUTPATIENT SPEECH LANGUAGE PATHOLOGY TREATMENT/RECERTIFCATION   Patient Name: Gordon Fisher MRN: [id number] DOB:24-Jun-1961, 62 y.o., male Today's Date: 11/24/2022  PCP: TBD REFERRING PROVIDER: Jennye Boroughs, MD    End of Session - 11/24/22 1219     Visit Number 15    Number of Visits 31    Date for SLP Re-Evaluation 02/01/23    Authorization - Visit Number 1    Authorization - Number of Visits 20    SLP Start Time [id number]    SLP Stop Time  1615    SLP Time Calculation (min) 42 min    Activity Tolerance Patient tolerated treatment well                     Past Medical History:  Diagnosis Date   Stroke [facility])    Past Surgical History:  Procedure Laterality Date   BUBBLE STUDY  02/15/2022   Procedure: BUBBLE STUDY;  Surgeon: Werner Lean, MD;  Location: [place];  Service: Cardiovascular;;   IR CT HEAD LTD  02/11/2022   IR CT HEAD LTD  02/11/2022   IR CT HEAD LTD  02/11/2022   IR PERCUTANEOUS ART THROMBECTOMY/INFUSION INTRACRANIAL INC DIAG ANGIO  02/11/2022   IR PERCUTANEOUS ART THROMBECTOMY/INFUSION INTRACRANIAL INC DIAG ANGIO  02/11/2022   IR US GUIDE VASC ACCESS RIGHT  02/11/2022   IR US GUIDE VASC ACCESS RIGHT  02/11/2022   RADIOLOGY WITH ANESTHESIA N/A 02/11/2022   Procedure: IR WITH ANESTHESIA;  Surgeon: Radiologist, Medication, MD;  Location: [place];  Service: Radiology;  Laterality: N/A;   RADIOLOGY WITH ANESTHESIA N/A 02/11/2022   Procedure: RADIOLOGY WITH ANESTHESIA;  Surgeon: Radiologist, Medication, MD;  Location: [place];  Service: Radiology;  Laterality: N/A;   TEE WITHOUT CARDIOVERSION N/A 02/15/2022   Procedure: TRANSESOPHAGEAL ECHOCARDIOGRAM (TEE);  Surgeon: Werner Lean, MD;  Location: [facility] ENDOSCOPY;  Service: Cardiovascular;  Laterality: N/A;   Patient Active Problem List   Diagnosis Date Noted   Hypocalcemia 03/22/2022   DVT, lower extremity, distal ([place]) 03/22/2022   Obesity 03/22/2022   Elevated LDL cholesterol level  03/22/2022   Acute ischemic left middle cerebral artery (MCA) stroke ([place]) 02/18/2022   Cerebrovascular accident (CVA) due to occlusion of left middle cerebral artery ([place]) 02/11/2022    ONSET DATE: 02-11-22   REFERRING DIAG: I63.9 (ICD-10-CM) - Cerebrovascular accident (CVA), unspecified mechanism ([place]) R13.10 (ICD-10-CM) - Dysphagia, unspecified type   THERAPY DIAG:  Verbal apraxia  Aphasia  Rationale for Evaluation and Treatment Rehabilitation  SUBJECTIVE:   SUBJECTIVE STATEMENT: "Yeah."  Pt accompanied by: self  PERTINENT HISTORY: Gordon Carbon. Fisher is a 62 year old male in relatively good health who was admitted via [facility] on 02/11/22 with right sided weakness, right facial droop and slurred speech. CTH showed L-MCA hyperdense sign and he received TNK prior to transfer. CTA showed L-MCA MI occlusion and he underwent cerebral angio with thrombectomy and TICI 3 flow by Dr. Nelida Gores [name].  Follow up MRI brain done revealing small areas of acute infarct infarct in L-MCA territory with mild hemorrhagic transformation in left parietal lobe and L-MCA bifurcation residual vascular thrombus. He was taken back for thrombectomy with stent for dissection but had intra-stent occlusion which was unable to be recanalized. Post op CT showed small SAH which was stable on follow up. He was started on ASA/Brillinta. MBS 04/18 showed oral dysphagia with oral delay and no penetration or aspiration. He was started on D3, thins with supervision. Repeat MRI brain on 04/16  showed extensive evolving acute ischemic L-MCA occlusion increased in size with associated edema, few additional small acute cortical subcortical infarcts involving contralateral right frontal lobe and left ACA distribution. Stroke felt to be due to unknown etiology and wife elected on Zio patch on discharge to rule out A fib. 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 Gordon Fisher was d/c'd from [facility] on 03-19-22, and has had HHST since that time until last week.   PAIN:  Are you having pain? No  PATIENT GOALS Pt indicated he would like talking to improve  OBJECTIVE:  DIAGNOSTIC FINDINGS: MRI without contrast 02/13/22 IMPRESSION: 1. Extensive evolving acute ischemic left MCA distribution infarct, increased in size as compared to previous MRI from 02/11/2022. Associated edema without significant midline shift. 2. Small volume acute subarachnoid hemorrhage at the posterior aspect of the left Sylvian fissure, stable. 3. Few additional small volume acute ischemic cortical and subcortical infarcts involving the contralateral right frontal lobe and left ACA distribution as above. 4. Underlying chronic microvascular ischemic disease.    PATIENT REPORTED OUTCOME MEASURES (PROM): Communication Effectiveness Survey: Pt scored 8/32 - higher scores indicate more effective communication/QOL.   TODAY'S TREATMENT:  11/23/22: Recert today. Pt has not been seen in over one month - verbal and receptive language skills have minimally improved. Pt has not spontaneously used device at home - however has used device when (rarely) prompted by wife. [name] has programmed device a little with farm items and garden items. SLP assisted pt and wife to relocate icons to more proper location/s. Pt req'd initial demo cues faded to occasional mod cues for editing icons' pictures and sound. With text, pt req'd faded cues to occasional min cues.  Continue all goals not met. Pt verbal expression improved dramatically when looking at picture and seeing icon name.   12/18/23[name] made some icons last week. Realized she had duplicated them when she saw them on another page and realized she had put some icons on the wrong page. Gordon Fisher is becoming more independent with deleting icons and is independent with moving icons up and down the page. He still requires occasional  min A with adding and modifying icons but is improving with this as his exposure with the process continues. Pt currently without internet so he cannot search for icons or back up device at home.   10/11/22: SLP began educating pt/wife how to create icons using camera, altered pronunciation text field for "leesa", and searching online for photo. By session end pt was accessing orange three-bar icon in upper right hand corner independently when cued "what do you press to modify or add icons?", and moving icons independently. Pt and wife to make icons for family, and use for communicating food choices.  10-08-22: Lingraphica start trial date today. SLP introduced device to pt/wife. Pt with functional verbalization within 3 seconds of initiation for "ham", "tomato", "cheese", "[name]", and "sandwich". Pt produced "sandwich" spontaneously after 8 seconds and mutliple attempts, without label. SLP introduced how to move, add, and delete icons. Pt communicated his sandwich choice ("sandwich meat" originally, and then "ham" after it was added to page), and toppings choices (tomato, cheese) with consistent min-mod cues, faded to occasional min-mod cues. Pt and wife to use device over the weekend for food - - SLP to assist/teach pt and [name] how to add pictures to icons for family members next session.  09/27/22: Lingraphica not yet sent from [facility]. Should  be here by pt's next appointment.  [name] told SLP names of people at their Thanksgiving celebration, with usual mod to max assist. Visual cues were most effective on pt's most difficult words. SLP assessed pt's reading comprehension - 80% with 4-5 words.   11/8/23[name] stated they had been busy this week with appointments but still work at practicing some. SLP initiated a 20-minute discussion today with Evette Doffing, and SLP explaining speech generating device in depth, showing [name] and [name] options for trial device and deciding on the Lingraphica Touch Talk. SLP to  make referral for this in the next few days.  Family names were written with mod-max A usually - pt got first 3 letters correct with childrens and grandchildrens' names. SLP showed [name] how to break up daughter's name to [id number]-[id number] to encourage brain's processing in a different way in order to foster new neural pathways to say the name.  09/06/22: SLP targeted verbal expression with picture naming and divergent naming. Pt notably demonstrates he knows the word/s he wants to say but is finding it difficult to pronounce them (verbal apraxia). Pt's expression improves with visual cues (oral cues from SLP for initial phoneme/sound) - 75% successful even without sound necessary - just visual cue for articulator placement.   09/01/22: SLP worked with pt's verbal expression and auditory comprehension today. Simple conversation/sentences were understood 90% of the time, 100% with one repeat by speaker. With opposites/paired words, pt had 70% success, improved to 90% with min A. With cloze phrases/sentences pt's response accuracy improved with 1 syllable words, but overall success was 17% with min A, improved to 50% with mod-max A. Often, pt knew the response but knew he was going to misarticulate the response. Written cues were helpful for pt here. SLP encouraged wife to cont with cloze phrases and paired words at home.  10/25/23[name] brought pt's cards today and SLP reviewed them with pt, modleing to wife cueing methods and hierarchy.Pt functionally said 6/7 salient objects (without written words) with mod-max cues, and 1/7 spontaneously. He said family names with written cues 6/7 (functionally), and 1/7 wth max cues Janett Billow"). Pt demo'd understnading of directions in context 75% today. SLP introduce possibility of Lingraphica to pt and wife and educated about studies proving that AAC assists verbal communication and that pt may not have to have assistance with a AAC device but we will know more as time  progresses.   08/23/22: SLP targeted verbal expression with pt today. Heavy perseveration with "[name]" grandson's name for all J names. SLP even did months of the year and pt cont to have perseveration with [name] for all J names. "[name]" - pt cont to make this into two syllables 90% of the time, with consistent max A. Pt used multi-modal communization using chicken clucks with SLP assistance and encouragement when pt could not say "chickens", and pointed to ages of his grandchildren with SLP writing numbers 1-10. Mod-max A needed for grandson 2 months old. Pt showed 1 and then gestured taking a short person's height for SLP to know young child. MAX A needed for month names > 1 syllable. With one syllable months pt 80% success. Pt told SLP name of his truck, and functionally told SLP name of one of his two tractors. Req'd mod/max A otherwise. Pt to cont to practice salient/high frequency/high importance words.   08/18/22: SLP targeted spelling today with pt - 3 letter words 100%, 4-letter words 80% - [name] stated pt had s/sx dyslexia prior to  CVA. SLP to monitor this. SLP provided some other hints/helpful suggestions for home practice and communication at home today. SLP noted pt read 3 and 4 letter words 50% accuracy when spelled correctly.   08/16/22: SLP assessed pt's reading comprehension - pt reading single words 6/6 and simple sentences of 6-9 words ("the boy is riding his bike in the park") 6/6. [name] said pt having difficulty reading newspaper from home. SLP did reading task (newspaper) with pt today. SLP highlighted key words for pt and he answered 4/6 questions correct by pt answering yes/no and pointing to correct portion in the article to indicate place where the answer was found. Pt will return CES next session or two.    PATIENT EDUCATION: Education details: see "Today's therapy" Person educated: Patient and Spouse Education method: Explanation and Demonstration Education comprehension:  verbalized understanding and needs further education   GOALS: Goals reviewed with patient? Yes, in general  SHORT TERM GOALS: Target date: 08/31/2022 extended one week due to visits  Pt will name 6 salient family names when given picture and written name with consistent mod-max cues in 3 sessions (precursor for speech generating device) Baseline:08-25-22. 09-01-22 Goal status: partially met  2.  Pt will produce simple phoneme/ bilabial+/a/  /a/, /ba/, /ma/, and/or /pa/ simultaneously with SLP 65% success (equal # reps/stimulus) in 2 sessions Baseline:  Goal status: deferred - pt to focus on salient items/terms  3.  Pt will demo understanding of simple commands in context 80% over three sessions Baseline: 08-25-22, 09-01-22 Goal status: Partially met  4.  Pt will indicate understanding of a complex sentence 85% of the time in 3 sessions (precursor for speech generating device) Baseline:  Goal status: not met  5.  Pt will copy written family names with 50% success in two sessions  Baseline:  Goal Status: met   LONG TERM GOALS: Target date: 10/24/2022 , 02/01/23  Pt will name 6 salient family names when given picture and written name with consistent mod cues in 3 sessions (precursor for speech generating device) Baseline:  Goal status: not met, and ongoing  2.  Pt will IMITATE simple phoneme/bilabial+/a/  /a/, /ba/, /ma/, and/or /pa/  65% success (equal # reps/stimulus) in 2 sessions Baseline:  Goal status: deferred - working with pt on more salient words  3.  Pt will generate meaningful message understood by [name] using speech generating device 60% of the time in 3 sessions Baseline:  Goal status: Not met, and Ongoing  4.  [name] will indicate that spontaneous communication is easier than during week of evaluation Baseline:  Goal status: Not met, and Ongoing   ASSESSMENT:  CLINICAL IMPRESSION: Recert today. He is a 62 y.o. male who cont to be seen today for treatment of  verbal apraxia (as pt often indicates he knows the response but knows it will not be articulated correctly) and aphasia. [place] education continued today. Pt will be receiving his device in the next 2-3 weeks and SLP to cont with training of pt and wife for basic programming and usage of device SEE TX NOTE from today for more details.   OBJECTIVE IMPAIRMENTS include expressive language, receptive language, and aphasia. These impairments are limiting patient from return to work, managing medications, managing appointments, household responsibilities, ADLs/IADLs, and effectively communicating at home and in community. Factors affecting potential to achieve goals and functional outcome are ability to learn/carryover information and severity of impairments. Patient will benefit from skilled SLP services to address above impairments and improve overall  function.  REHAB POTENTIAL: Good  PLAN: SLP FREQUENCY: 2x/week  SLP DURATION: 8 weeks  PLANNED INTERVENTIONS: Language facilitation, Environmental controls, Cueing hierachy, Internal/external aids, Functional tasks, and Multimodal communication approach    Alexus Galka, CCC-SLP 11/24/2022, 12:27 PM

## 2022-11-23 NOTE — Therapy (Signed)
OUTPATIENT PHYSICAL THERAPY NEURO TREATMENT   Patient Name: Gordon Fisher MRN: [id number] DOB:1961/08/22, 62 y.o., male Today's Date: 11/23/2022   PCP: None listed REFERRING PROVIDER: Jennye Boroughs, MD    PT End of Session - 11/23/22 1608     Visit Number 20    Number of Visits 40    Date for PT Re-Evaluation 12/13/22    Authorization Type [employer] [id number]    Authorization - Visit Number 1    Authorization - Number of Visits 20    Progress Note Due on Visit 10    PT Start Time 1025    PT Stop Time 1700    PT Time Calculation (min) 45 min    Equipment Utilized During Treatment Gait belt    Activity Tolerance Patient tolerated treatment well    Behavior During Therapy WFL for tasks assessed/performed                      Past Medical History:  Diagnosis Date   Stroke [facility])    Past Surgical History:  Procedure Laterality Date   BUBBLE STUDY  02/15/2022   Procedure: BUBBLE STUDY;  Surgeon: Werner Lean, MD;  Location: [place];  Service: Cardiovascular;;   IR CT HEAD LTD  02/11/2022   IR CT HEAD LTD  02/11/2022   IR CT HEAD LTD  02/11/2022   IR PERCUTANEOUS ART THROMBECTOMY/INFUSION INTRACRANIAL INC DIAG ANGIO  02/11/2022   IR PERCUTANEOUS ART THROMBECTOMY/INFUSION INTRACRANIAL INC DIAG ANGIO  02/11/2022   IR US GUIDE VASC ACCESS RIGHT  02/11/2022   IR US GUIDE VASC ACCESS RIGHT  02/11/2022   RADIOLOGY WITH ANESTHESIA N/A 02/11/2022   Procedure: IR WITH ANESTHESIA;  Surgeon: Radiologist, Medication, MD;  Location: [place];  Service: Radiology;  Laterality: N/A;   RADIOLOGY WITH ANESTHESIA N/A 02/11/2022   Procedure: RADIOLOGY WITH ANESTHESIA;  Surgeon: Radiologist, Medication, MD;  Location: [place];  Service: Radiology;  Laterality: N/A;   TEE WITHOUT CARDIOVERSION N/A 02/15/2022   Procedure: TRANSESOPHAGEAL ECHOCARDIOGRAM (TEE);  Surgeon: Werner Lean, MD;  Location: [facility] END[facility]COPY;  Service: Cardiovascular;  Laterality: N/A;   Patient Active  Problem List   Diagnosis Date Noted   Hypocalcemia 03/22/2022   DVT, lower extremity, distal ([place]) 03/22/2022   Obesity 03/22/2022   Elevated LDL cholesterol level 03/22/2022   Acute ischemic left middle cerebral artery (MCA) stroke ([place]) 02/18/2022   Cerebrovascular accident (CVA) due to occlusion of left middle cerebral artery ([place]) 02/11/2022    ONSET DATE: April 2023  REFERRING DIAG: I63.9 (ICD-10-CM) - Cerebrovascular accident (CVA), unspecified mechanism ([place]) R13.10 (ICD-10-CM) - Dysphagia, unspecified type   THERAPY DIAG:  Muscle weakness (generalized)  Other abnormalities of gait and mobility  Unsteadiness on feet  Difficulty in walking, not elsewhere classified  Hemiplegia and hemiparesis following cerebral infarction affecting right dominant side ([place])  Rationale for Evaluation and Treatment Rehabilitation  SUBJECTIVE:  SUBJECTIVE STATEMENT: Wife reports "everything has been going well." Pt accompanied by: significant other  PERTINENT HISTORY: 02/11/2022 patient developed right-sided weakness, aphasia, went to hospital for evaluation.  Code stroke was activated.  He received TNK and then transferred to [facility].  He underwent cerebral angiogram with thrombectomy.  Unfortunately vessel reoccluded later the day and the second thrombectomy was attempted.  Left M2 dissection was identified and treated with stent but unfortunately stent demonstrated reocclusion   PAIN:  Are you having pain? No  PRECAUTIONS: Shoulder and Fall, Aphasia   WEIGHT BEARING RESTRICTIONS No  FALLS: Has patient fallen in last 6 months? Yes. Number of falls 1, required 3-assist to recover  LIVING ENVIRONMENT: Lives with: lives with their spouse Lives in: House/apartment Stairs: Yes: Internal:  flight steps; bilateral but cannot reach both and External: 4-5 steps; ramp is also present.  Has following equipment at home: Hemi walker, Wheelchair (manual), and sit-stand lift  PLOF: Independent  PATIENT GOALS regain independence  OBJECTIVE:    TODAY'S TREATMENT: 11/23/22 Activity Comments  Gait training -wide foot straight cane: CGA for step length -sidestepping with CGA for SLS and hip abd facilitation -Retrowalking for hip extension -ambulation with U-step to facilitate reciprocal steps and length 3 min, 3 min 46", 4 min 46",   Stair training -facilitation of reciprocal ascending by therapist supporting RLE for knee/hip extension to promote LLE advancement 3x10 and 1x30 reps -RLE advancement up/down 3x10 reps for hip flexion assist to promote foot clearance                 TODAY'S TREATMENT: 10/18/22 Activity Comments  sidestepping Along II bars; 1 UE support  Alt side stepping Cueing to step "tall" with R foot on return to midline   backwards walking Along II bars; 1 UE support; good response to "kick the leg back" to elongate R step back; manual cues at chest to avoid compensating with trunk flexion   Forward/back stepping over 1/2 foam roll 15x each Cueing to shift onto standing LE and taller step over with R LE; able to wean to perform without UE support and with min A  alt toe tap on step 6" Able to wean UE support but required reminders to shift R upon tapping L foot and min A required to lift R foot onto step  mini squat with green TB around R hip 15x Initially required increased cues/explanation to elicit appropriate wt shift, then with good ability to perform symmetric LE use   gait with quad cane outside on sidewalk and ramp x158ft  CGA througout; cueing to increase R step length d/t toe scraping on floor with fatigue- good carryover              PATIENT EDUCATION: Education details: edu on benefit of toe cap to R shoe; encouraged patient to bring his quad cane  into next session Person educated: Patient and Spouse Education method: Explanation, Demonstration, Tactile cues, Verbal cues, and Handouts Education comprehension: verbalized understanding and returned demonstration     HOME EXERCISE PROGRAM Last updated: 09/15/22 Access Code: [email or web address] URL: [email or web address] Date: 09/15/2022 Prepared by: [facility]  Exercises - Forward Backward Weight Shift with Counter Support  - 1 x daily - 5 x weekly - 2 sets - 10 reps - Side to Side Weight Shift with Counter Support  - 1 x daily - 5 x weekly - 2 sets - 10 reps - Mini Squat with Counter Support  -  1 x daily - 5 x weekly - 2 sets - 10 reps - Stride Stance Weight Shift  - 1 x daily - 5 x weekly - 2 sets - 10 reps - Sit to stand in stride stance  - 2 x daily - 7 x weekly - 1-2 sets - 5 reps - Supine Bridge with Mini Swiss Ball Between Knees  - 1 x daily - 5 x weekly - 2 sets - 10 reps - Supine Heel Slide  - 1 x daily - 5 x weekly - 2 sets - 10 reps - Seated Isometric Hip Adduction with Ball  - 1 x daily - 5 x weekly - 2 sets - 10 reps - 3 sec hold    Below measures were taken at time of initial evaluation unless otherwise specified:     DIAGNOSTIC FINDINGS: Follow-up MRI brain done revealing small areas of acute infarct in left-MCA territory with mild hemorrhagic transformation in left parietal lobe and left-MCA bifurcation residual vascular thrombus   COGNITION: Overall cognitive status:  Difficult to assess due to aphasia   SENSATION: Light touch: Impaired  Proprioception: Impaired   COORDINATION: RLE impaired, some deficits on left foot with alternating movements and isolated movements  EDEMA:  None   MUSCLE TONE: RLE: Modifed Ashworth Scale 2 = More marked increase in muscle tone through most of the ROM, but affected part(s) easily moved right quad most affected, no clonus right plantarflexors   MUSCLE LENGTH: Able to  achieve full right knee extension to PROM  DTRs:  Patella 3+ = Brisk  POSTURE: No Significant postural limitations  LOWER EXTREMITY ROM:     LLE WNL, RLE AROM limited by weakness  LOWER EXTREMITY MMT:    MMT Right Eval Left Eval  Hip flexion 3- 5  Hip extension    Hip abduction 3- 4  Hip adduction 3- 5  Hip internal rotation    Hip external rotation    Knee flexion 3 5  Knee extension 2+ 5  Ankle dorsiflexion 0 5  Ankle plantarflexion 1 3+  Ankle inversion    Ankle eversion    (Blank rows = not tested)  BED MOBILITY:  NT  TRANSFERS: Assistive device utilized: Hemi walker  Sit to stand: Modified independence Stand to sit: SBA Chair to chair: SBA and CGA Floor:  Total assist  RAMP:  Level of Assistance: CGA Assistive device utilized: Hemi walker Ramp Comments:   CURB:  Level of Assistance: Min A Assistive device utilized: Nutritional therapist Comments: cues in sequence  STAIRS:  Level of Assistance: CGA and Min A  Stair Negotiation Technique: Step to Pattern with Single Rail on Left  Number of Stairs: 10   Height of Stairs: 4-6"  Comments: cues and guarding  GAIT: Gait pattern: step to pattern, decreased ankle dorsiflexion- Right, and circumduction- Right Distance walked: 82 Assistive device utilized: Hemi walker Level of assistance: CGA Comments: deficits during turning  FUNCTIONAL TESTs:  5 times sit to stand: 11.59 sec with 3 retro LOB and poor eccentric control Timed up and go (TUG): 34.75 sec Berg Balance Scale: 32/56 Dynamic Gait Index: NT 2 minute walk test: 82 ft w/ hemi-walker, 0.68 ft/sec  M-CTSIB  Condition 1: Firm Surface, EO 30 Sec, Normal and Mild Sway  Condition 2: Firm Surface, EC 30 Sec, Mild and Moderate Sway  Condition 3: Foam Surface, EO 30 Sec, Mild Sway  Condition 4: Foam Surface, EC 26 Sec, Moderate and Severe Sway     PATIENT  SURVEYS:  FOTO 47%  TODAY'S TREATMENT:  assessment   PATIENT EDUCATION: Education  details: assessment findings, PT scope of practice Person educated: Patient and Spouse Education method: Explanation Education comprehension: verbalized understanding   HOME EXERCISE PROGRAM: TBD    GOALS: Goals reviewed with patient? Yes  SHORT TERM GOALS: Target date: 11/01/2022    Patient will perform HEP with family/caregiver supervision for improved strength, balance, transfers, and gait  Baseline: Goal status: GOAL MET, 08/23/2022  2.  Ambulate 12 stairs with left HR and supervision to improve safety within home Baseline: CGA-min A; per report CGA/supervision 6 steps; able to perform Goal status: MET  3.  Demonstrate improved safety and efficiency with ambulation per distance of 100 ft during 2MWT Baseline: 82 ft w/ HW and CGA; 111 ft 08/23/2022 Goal status: GOAL MET, 08/23/2022  4. Demonstrate improved gait speed to 1.8 ft/sec with right AFO and least restrictive AD to improve efficiency of gait  Baseline: 0.68 ft/sec at eval; (09/20/22) 1.59 ft/sec w/ platform RW and AFO  Goal status: On-going    LONG TERM GOALS: Target date: 12/13/2022    Demonstrate improved balance and reduced risk for falls per score 45/56 Berg Balance Test Baseline: 32/56; (09/20/22) 42/56 Goal status:Goal revised  2.  Demonstrate modified independent ambulation x 150 ft level surfaces using least restrictive AD in order to improve safety with home environment Baseline: CGA w/ HW; (09/20/22) supervision with HW level surfaces Goal status: IN PROGRESS  3.  Manifest improved safety with gait per time of 18 sec TUG test using least restrictive AD Baseline: 34.75 sec w/ HW; (09/20/22) 30.69 w/ HW and right AFO Goal status: IN PROGRESS   4. Stair ambulation x 15 steps with set-up assist to improve safety in home environment and reduce burden of care  Baseline: SBA with single HR, using AFO  Goal status: On-going      ASSESSMENT:  CLINICAL IMPRESSION: Good performance using U-step  walker to facilitate reciprocal step pattern and RLE swing through with good effect maintaining consistent pattern throughout trials and emphasis on continuous steps and improving 180 degree turns with preference for turning left evident vs loading on right. Good tolerance and performance with stair training with obvious improvements in RLE strength and extension power of quad and glutes, less so for right hip flexion as evidenced by need for assist and/or significant compensation for lifting limb to clear edge of step. Continued sessions to progress mobility and balance to increase efficiency and reduce risk for falls     OBJECTIVE IMPAIRMENTS Abnormal gait, decreased activity tolerance, decreased balance, decreased coordination, decreased knowledge of use of DME, decreased mobility, difficulty walking, decreased strength, impaired sensation, impaired tone, impaired UE functional use, and improper body mechanics.   ACTIVITY LIMITATIONS carrying, lifting, bending, standing, squatting, stairs, transfers, bathing, toileting, dressing, reach over head, and locomotion level  PARTICIPATION LIMITATIONS: meal prep, cleaning, laundry, interpersonal relationship, driving, shopping, community activity, occupation, yard work, and leisure activities  [place] Time since onset of injury/illness/exacerbation are also affecting patient's functional outcome.   REHAB POTENTIAL: Excellent  CLINICAL DECISION MAKING: Evolving/moderate complexity  EVALUATION COMPLEXITY: Moderate  PLAN: PT FREQUENCY: 1-2/wk  PT DURATION: 12 weeks  PLANNED INTERVENTIONS: Therapeutic exercises, Therapeutic activity, Neuromuscular re-education, Balance training, Gait training, Patient/Family education, Self Care, Joint mobilization, Stair training, Vestibular training, Canalith repositioning, Orthotic/Fit training, DME instructions, Aquatic Therapy, Dry Needling, Electrical stimulation, Spinal mobilization, Cryotherapy, Moist  heat, Splintting, Taping, and Manual therapy  PLAN FOR NEXT SESSION: outdoor  quad cane gait training including ramps, curbs, trial SPC? hip ABD strengthening     4:08 PM, 11/23/22 M. Shary Decamp, PT, DPT Physical Therapist- [facility] Office Number: [phone]  [facility] [place] [address], [address] [place], [place] [address] Phone # [phone] Fax # [phone]

## 2022-11-26 ENCOUNTER — Telehealth: Payer: Self-pay | Admitting: Physical Medicine & Rehabilitation

## 2022-11-26 NOTE — Telephone Encounter (Signed)
Following up on fax sent to our office on the 22nd, for forms needed to be completed by physician.   Please follow up

## 2022-11-26 NOTE — Telephone Encounter (Signed)
Gordon Fisher [phone]   Call returned to the wife she is not aware of any forms.    I called Gordon Fisher at the above phone number. I was not able to leave a message to find out what forms.

## 2022-12-01 ENCOUNTER — Telehealth: Payer: Self-pay | Admitting: Physical Medicine & Rehabilitation

## 2022-12-01 NOTE — Progress Notes (Signed)
[facility] Summary Report / Loop Recorder

## 2022-12-01 NOTE — Telephone Encounter (Signed)
Following up on fax request sent as urgent on 1/26. Calling with [employer]raph[employer]

## 2022-12-02 ENCOUNTER — Ambulatory Visit: Payer: 59

## 2022-12-02 ENCOUNTER — Ambulatory Visit: Payer: 59 | Attending: Physical Medicine & Rehabilitation | Admitting: Occupational Therapy

## 2022-12-02 DIAGNOSIS — R2689 Other abnormalities of gait and mobility: Secondary | ICD-10-CM | POA: Insufficient documentation

## 2022-12-02 DIAGNOSIS — I69351 Hemiplegia and hemiparesis following cerebral infarction affecting right dominant side: Secondary | ICD-10-CM

## 2022-12-02 DIAGNOSIS — R482 Apraxia: Secondary | ICD-10-CM

## 2022-12-02 DIAGNOSIS — M6281 Muscle weakness (generalized): Secondary | ICD-10-CM | POA: Insufficient documentation

## 2022-12-02 DIAGNOSIS — R262 Difficulty in walking, not elsewhere classified: Secondary | ICD-10-CM

## 2022-12-02 DIAGNOSIS — R208 Other disturbances of skin sensation: Secondary | ICD-10-CM | POA: Diagnosis present

## 2022-12-02 DIAGNOSIS — R2681 Unsteadiness on feet: Secondary | ICD-10-CM | POA: Diagnosis present

## 2022-12-02 DIAGNOSIS — R278 Other lack of coordination: Secondary | ICD-10-CM | POA: Insufficient documentation

## 2022-12-02 DIAGNOSIS — R4701 Aphasia: Secondary | ICD-10-CM | POA: Insufficient documentation

## 2022-12-02 NOTE — Telephone Encounter (Signed)
Call returned to [place] :  The fax has been located. It is currently on Dr. Curlene Dolphin desk for review & completion.

## 2022-12-02 NOTE — Therapy (Signed)
OUTPATIENT OCCUPATIONAL THERAPY NEURO TREATMENT NOTE  Patient Name: Gordon Fisher MRN: [id number] DOB:[birth year], 62 y.o., male Today's Date: 12/02/2022  PCP: none on file REFERRING PROVIDER: Jennye Boroughs, MD    OT End of Session - 12/02/22 1450     Visit Number 20    Number of Visits 30    Date for OT Re-Evaluation 01/21/23    Authorization Type [employer]    Authorization Time Period VL: 60 combined (OT/PT/ST)    Authorization - Number of Visits 20    OT Start Time 1450    OT Stop Time 1530    OT Time Calculation (min) 40 min    Activity Tolerance Patient tolerated treatment well    Behavior During Therapy WFL for tasks assessed/performed                        Past Medical History:  Diagnosis Date   Stroke [facility])    Past Surgical History:  Procedure Laterality Date   BUBBLE STUDY  02/15/2022   Procedure: BUBBLE STUDY;  Surgeon: Werner Lean, MD;  Location: [place];  Service: Cardiovascular;;   IR CT HEAD LTD  02/11/2022   IR CT HEAD LTD  02/11/2022   IR CT HEAD LTD  02/11/2022   IR PERCUTANEOUS ART THROMBECTOMY/INFUSION INTRACRANIAL INC DIAG ANGIO  02/11/2022   IR PERCUTANEOUS ART THROMBECTOMY/INFUSION INTRACRANIAL INC DIAG ANGIO  02/11/2022   IR US GUIDE VASC ACCESS RIGHT  02/11/2022   IR US GUIDE VASC ACCESS RIGHT  02/11/2022   RADIOLOGY WITH ANESTHESIA N/A 02/11/2022   Procedure: IR WITH ANESTHESIA;  Surgeon: Radiologist, Medication, MD;  Location: [place];  Service: Radiology;  Laterality: N/A;   RADIOLOGY WITH ANESTHESIA N/A 02/11/2022   Procedure: RADIOLOGY WITH ANESTHESIA;  Surgeon: Radiologist, Medication, MD;  Location: [place];  Service: Radiology;  Laterality: N/A;   TEE WITHOUT CARDIOVERSION N/A 02/15/2022   Procedure: TRANSESOPHAGEAL ECHOCARDIOGRAM (TEE);  Surgeon: Werner Lean, MD;  Location: [facility] ENDOSCOPY;  Service: Cardiovascular;  Laterality: N/A;   Patient Active Problem List   Diagnosis Date Noted    Hypocalcemia 03/22/2022   DVT, lower extremity, distal ([place]) 03/22/2022   Obesity 03/22/2022   Elevated LDL cholesterol level 03/22/2022   Acute ischemic left middle cerebral artery (MCA) stroke ([place]) 02/18/2022   Cerebrovascular accident (CVA) due to occlusion of left middle cerebral artery ([place]) 02/11/2022    ONSET DATE: referral 07/15/22 (CVA 02/11/22)  REFERRING DIAG: I63.9 (ICD-10-CM) - Cerebrovascular accident (CVA), unspecified mechanism ([place]) R13.10 (ICD-10-CM) - Dysphagia, unspecified type   THERAPY DIAG:  Hemiplegia and hemiparesis following cerebral infarction affecting right dominant side (HCC)  Other lack of coordination  Other disturbances of skin sensation  Unsteadiness on feet  Muscle weakness (generalized)  Rationale for Evaluation and Treatment Rehabilitation  SUBJECTIVE:   SUBJECTIVE STATEMENT: Pt's spouse asking about AE to utilize during self-feeding and kitchen tasks. 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 No  FALLS: Has patient fallen in last 6 months? Yes. Number of falls 1  PLOF: Independent, Independent with basic ADLs, and Independent with gait  PATIENT GOALS: continue to make gains, independence with getting dressed   OBJECTIVE:   HAND DOMINANCE: Right  FUNCTIONAL OUTCOME MEASURES: FOTO: 11  UPPER EXTREMITY ROM    Active ROM: Pt with no AROM during evaluation.  Pt was able to elicit min shoulder elevation and slight gross grasp when cued and provided increased time to initiate movement.   09/22/22: Pt still with no AROM in upright position, is able to elicit slight internal rotation and mod elbow flexion/extension in gravity assisted position. 01/14/16: Pt eliciting trace shoulder flexion, 44* elbow flexion, -16* elbow extension, and able to elicit thumb extension with  shoulder internal rotation.  Pt demonstrating ~30% of scapular retraction and elevation with intermitent tactile cues.     Passive ROM Right Eval - 9/25 Right 11/23/22  Shoulder flexion 100 120  Shoulder abduction  95  Shoulder adduction    Shoulder extension    Shoulder internal rotation WNL Unable to tolerate reaching past torso towards back pocket  Shoulder external rotation onset of pain >5-10* from neutral Onset of pain > 30*  Middle trapezius    Lower trapezius    Elbow flexion WNL WNL  Elbow extension WNL WNL  Wrist flexion WNL WNL  Wrist extension WNL WNL  Wrist ulnar deviation    Wrist radial deviation    Wrist pronation    Wrist supination    (Blank rows = not tested)  HAND FUNCTION: 11/23/22: loose gross grasp with increased time for initiation, unable to release grasp  SENSATION: Difficult to assess due to expressive aphasia  MUSCLE TONE: RUE: Mild and Hypertonic  ------------------------------------------------------------------------------------------------------------------------------------------------------------ (objective measures above completed at initial evaluation unless otherwise dated)  TODAY'S TREATMENT: 12/02/22 NMR: engaged in rolling large therapy ball with use of hand over hand to facilitate increased ROM and activation of RUE.  Completed forward/backward and side to side rolling with focus on increased ROM.  OT then challenged pt to complete with only RUE.  Pt able to complete forward backward rolling of large therapy ball with intermittent use of trunk to compensate for decreased shoulder activation.  Transitioned to rolling small ball on thigh.  Pt able to initiate and control movement to allow rolling of ball forward/backward and side to side without knocking ball off of thigh.  Educated on functional carryover to washing thighs in shower.  Discussed use of wash mitt to allow functional use of arm without needing functional grasp. Horizontal  abduction/adduction: engaged in transitioning hand from mat table at side <> thigh with focus on shoulder elevation and horizontal abduction/adduction.  Pt able to complete with increased effort and initial overshooting however improved with repetition.  Towel slides on table top with focus on horizontal abduction/adduction.  Pt continues to require assistance for abduction, however able to complete adduction against mod resistance. AE: pt still lacking functional grasp and could benefit from use of adaptive equipment to accommodate for decreased grasp, however still lacks sufficient shoulder movement against gravity to functionally utilize AE at this time.  Will continue to assess and recommend as appropriate to increase functional use/incorporation of RUE into tasks.    11/23/22 NMR:  Sitting: engaged in scapular retraction and shoulder elevation in sitting on mat with min tactile cues and intermittent tapping at shoulder to facilitate increased activation.  Pt able to complete ~30% of movement with above cues.  Pt benefiting from increased time and cues to go slow to allow for initiation of shoulder/scapular movement on R. Supine: pt able to elicit abduction in supine with increased time and tactile cues.  OT providing facilitation for improved alignment and cues to visually attend to UE during movements.  Pt tolerating PROM for assessment with improved tolerance to shoulder flexion, still limited with abduction due to  pain and onset of tone. Standing: pt able to achieve 44* elbow flexion in standing, incorporating min internal rotation at shoulder and controlled breathing.  However able to replicate with cues to focus on breathing during movements.  OT educated on importance of increased focus on AAROM/PROM of extension as pt is developing flexor synergy pattern movement.   AAROM: Utilized UE ranger and then transitioning to therapist supported movement with focus on motor control and decreasing  shoulder internal rotation.  OT providing tactile cues to decrease internal rotation with good body mechanics and positioning, as pt with tendency to compensate with internal rotation. Pt able to recognize onset of compensatory movements with min cues and able to make attempts at correcting.  OT providing stretch at end ranges.    10/18/22 NMR: Reviewed precautions and parameters for use of NMES.  Protocol set up on [place] system,  with NMES applied to supraspinatus and middle deltoid to help approximate shoulder join to reduce sublux. with trace stimulation recognized however inconsistent.  OT changed pt positioning and positioning of electrodes with minimal carryover.  Discussed limitations due to required intensity with plan to attempt at a future session with supervisor present to continue to troubleshoot.    Ratio 1:3 Rate 35 pps Waveform- Asymmetric Ramp 1.0 Pulse 300 Intensity- 65 Duration -   5 min  Report of pain at the beginning of session  Report of pain at the end of session  No adverse reactions after treatment and is skin intact.   AAROM: closed chain movements with dowel and facilitation from OT at elbow for improved body mechanics. Completed chest press and shoulder flexion with dowel for improved symmetry and activation of RUE. Pt demonstrating improved ability to maintain grasp during movements.  Horizontal abduction/adduction with hand over hand to facilitate proper movement while decreasing trunk movements.  OT educated pt and spouse on positioning, tactile cues, and to maintain good shoulder approximation during exercises and not let UE "hang". PROM/AAROM: internal/external rotation with OT providing facilitation and tapping.  Pt with decreased external rotation.  OT providing tactile cues to facilitate internal rotation with good body mechanics and positioning, as pt with tendency to compensate with internal rotation.  Pt demonstrating elbow flexion/extension within  moderate range, lacking ~30* at either end of movement.  OT providing stretch and end ranges.  PATIENT EDUCATION: Ongoing condition-specific education related to therapeutic interventions completed this session Person educated: Patient and Spouse Education method: Explanation, Demonstration, Tactile cues, Verbal cues, and Handouts Education comprehension: verbalized understanding and needs further education   HOME EXERCISE PROGRAM: Written exercises of shoulder and elbow in supine with cues for hand placement and facilitation.   Self-ROM handout from [facility] lab  [place] Access Code: [email or web address] URL: [email or web address] - Supine Shoulder Flexion AAROM with Hands Clasped  - 1 x daily - 1 sets - 10 reps - Seated Elbow Extension and Shoulder External Rotation AAROM at Table with Towel  - 1 x daily - 1 sets - 10 reps - Finger Extension with Wrist Extension Caregiver PROM  - 1 x daily - 1 sets - 10 reps  GOALS: Goals reviewed with patient? Yes  SHORT TERM GOALS:   12/24/22  Pt and spouse will be independent with advanced PROM and AAROM HEP for shoulder stability and shoulder and elbow ROM. Baseline: Goal status: IN PROGRESS  2.  Pt and spouse will verbalize understanding of AE and/or task modifications as needed for fastening shoes.  Baseline:  Goal status: IN PROGRESS   LONG  TERM GOALS: Target date 01/21/23  Patient will be able to incorporate RUE into UB/LB dressing task at stabilizer to gross assist level. Baseline: Goal status: IN PROGRESS  2.   Pt will utilize RUE as gross assist during grooming tasks (such as washing hands and face). Baseline:  Goal status: IN PROGRESS  3.   Pt and spouse will verbalize and/or  demonstrate understanding of advanced treatment strategies and implementation at home (NMES, WB) to facilitate increased motor recovery.  Baseline:  Goal status: IN PROGRESS  4.   Pt will demonstrate ability to utilize BUE during mobility tasks to  pushup from chair with BUE on arm rests.  Baseline:  Goal status: IN PROGRESS  5.   Pt will demonstrate ability to don/doff footwear (to include socks and shoes) with supervision and use of AE PRN.  Baseline:  Goal status: IN PROGRESS  ASSESSMENT:  CLINICAL IMPRESSION: Treatment session with focus on NMR with RUE.  Pt continues to demonstrate improved awareness of body in space with mod fading to min cues due to aphasia and apraxia. Pt with decreased compensatory internal rotation during attempts at shoulder flexion and elbow extension/flexion, demonstrating improvements in motor control even in upright position.  Pt and spouse receptive to incorporating RUE into functional tasks to maximize return, utilizing with washing of legs and torso with UE support and attempting to incorporate into pulling up of pants.   PERFORMANCE DEFICITS in functional skills including ADLs, IADLs, coordination, sensation, tone, ROM, strength, pain, flexibility, FMC, GMC, mobility, balance, body mechanics, vision, and UE functional use and psychosocial skills including environmental adaptation and routines and behaviors.   IMPAIRMENTS are limiting patient from ADLs and IADLs.   COMORBIDITIES may have co-morbidities  that affects occupational performance. Patient will benefit from skilled OT to address above impairments and improve overall function.   PLAN: OT FREQUENCY: 1-2x/week  OT DURATION: 8 weeks  PLANNED INTERVENTIONS: self care/ADL training, therapeutic exercise, therapeutic activity, neuromuscular re-education, manual therapy, passive range of motion, balance training, functional mobility training, splinting, electrical stimulation, compression bandaging, moist heat, cryotherapy, patient/family education, visual/perceptual remediation/compensation, psychosocial skills training, energy conservation, coping strategies training, and DME and/or AE instructions  RECOMMENDED OTHER SERVICES: N/A  CONSULTED  AND AGREED WITH PLAN OF CARE: Patient and family member/caregiver  PLAN FOR NEXT SESSION: Continue w/ scapular stability: shoulder shrugs, rolls, and scapular retraction; WB and dynamic standing, supine and sidelying NMR.  Increased focus on incorporation of RUE into functional tasks.  E-stim as cleared by MD.   Rosalio Loud, OTR/L 12/02/2022, 2:50 PM

## 2022-12-02 NOTE — Therapy (Signed)
OUTPATIENT PHYSICAL THERAPY NEURO TREATMENT, Progress Note, and Recertification   Patient Name: Gordon Fisher MRN: [id number] DOB:[birth year], 62 y.o., male Today's Date: 12/02/2022   PCP: None listed REFERRING PROVIDER: Jennye Boroughs, MD   Progress Note Reporting Period 09/13/22 to 12/02/22  See note below for Objective Data and Assessment of Progress/Goals.      PT End of Session - 12/02/22 1619     Visit Number 21    Number of Visits 40    Date for PT Re-Evaluation 02/24/23    Authorization Type [employer] [id number]    Authorization - Visit Number 2    Authorization - Number of Visits 20    PT Start Time 9373    PT Stop Time 1700    PT Time Calculation (min) 45 min    Equipment Utilized During Treatment Gait belt    Activity Tolerance Patient tolerated treatment well    Behavior During Therapy WFL for tasks assessed/performed                      Past Medical History:  Diagnosis Date   Stroke [facility])    Past Surgical History:  Procedure Laterality Date   BUBBLE STUDY  02/15/2022   Procedure: BUBBLE STUDY;  Surgeon: Werner Lean, MD;  Location: [place];  Service: Cardiovascular;;   IR CT HEAD LTD  02/11/2022   IR CT HEAD LTD  02/11/2022   IR CT HEAD LTD  02/11/2022   IR PERCUTANEOUS ART THROMBECTOMY/INFUSION INTRACRANIAL INC DIAG ANGIO  02/11/2022   IR PERCUTANEOUS ART THROMBECTOMY/INFUSION INTRACRANIAL INC DIAG ANGIO  02/11/2022   IR US GUIDE VASC ACCESS RIGHT  02/11/2022   IR US GUIDE VASC ACCESS RIGHT  02/11/2022   RADIOLOGY WITH ANESTHESIA N/A 02/11/2022   Procedure: IR WITH ANESTHESIA;  Surgeon: Radiologist, Medication, MD;  Location: [place];  Service: Radiology;  Laterality: N/A;   RADIOLOGY WITH ANESTHESIA N/A 02/11/2022   Procedure: RADIOLOGY WITH ANESTHESIA;  Surgeon: Radiologist, Medication, MD;  Location: [place];  Service: Radiology;  Laterality: N/A;   TEE WITHOUT CARDIOVERSION N/A 02/15/2022   Procedure: TRANSESOPHAGEAL ECHOCARDIOGRAM (TEE);   Surgeon: Werner Lean, MD;  Location: [facility] ENDOSCOPY;  Service: Cardiovascular;  Laterality: N/A;   Patient Active Problem List   Diagnosis Date Noted   Hypocalcemia 03/22/2022   DVT, lower extremity, distal ([place]) 03/22/2022   Obesity 03/22/2022   Elevated LDL cholesterol level 03/22/2022   Acute ischemic left middle cerebral artery (MCA) stroke ([place]) 02/18/2022   Cerebrovascular accident (CVA) due to occlusion of left middle cerebral artery ([place]) 02/11/2022    ONSET DATE: April 2023  REFERRING DIAG: I63.9 (ICD-10-CM) - Cerebrovascular accident (CVA), unspecified mechanism ([place]) R13.10 (ICD-10-CM) - Dysphagia, unspecified type   THERAPY DIAG:  Hemiplegia and hemiparesis following cerebral infarction affecting right dominant side (HCC)  Unsteadiness on feet  Muscle weakness (generalized)  Other abnormalities of gait and mobility  Difficulty in walking, not elsewhere classified  Rationale for Evaluation and Treatment Rehabilitation  SUBJECTIVE:  SUBJECTIVE STATEMENT: Exercising/walking/practicing with sister-in-law several times a week  Pt accompanied by: significant other  PERTINENT HISTORY: 02/11/2022 patient developed right-sided weakness, aphasia, went to hospital for evaluation.  Code stroke was activated.  He received TNK and then transferred to [facility].  He underwent cerebral angiogram with thrombectomy.  Unfortunately vessel reoccluded later the day and the second thrombectomy was attempted.  Left M2 dissection was identified and treated with stent but unfortunately stent demonstrated reocclusion   PAIN:  Are you having pain? No  PRECAUTIONS: Shoulder and Fall, Aphasia   WEIGHT BEARING RESTRICTIONS No  FALLS: Has patient fallen in last 6 months? Yes. Number of  falls 1, required 3-assist to recover  LIVING ENVIRONMENT: Lives with: lives with their spouse Lives in: House/apartment Stairs: Yes: Internal: flight steps; bilateral but cannot reach both and External: 4-5 steps; ramp is also present.  Has following equipment at home: Hemi walker, Wheelchair (manual), and sit-stand lift  PLOF: Independent  PATIENT GOALS regain independence  OBJECTIVE:   TODAY'S TREATMENT: 12/02/22 Activity Comments  10 meter walk 23.37 sec = 1.4 ft/sec  TUG test  23 sec w/ [facility]  Berg Balance 49/56  Floor transfer/recovery Able to achieve half kneeling mod A, floor to position total/max A  Gait training -w/ RW to promote reciprocal stride/continuous steps -10 meter walk w/ RW: 1.6 ft/sec  Pt education -discussion with pt/spouse regarding current STG/LTG and collaboration in new goals to improve functional status        PATIENT EDUCATION: Education details: edu on outcome measures Person educated: Patient and Spouse Education method: Explanation, Demonstration, Tactile cues, Verbal cues, and Handouts Education comprehension: verbalized understanding and returned demonstration     HOME EXERCISE PROGRAM Last updated: 09/15/22 Access Code: [email or web address] URL: [email or web address] Date: 09/15/2022 Prepared by: [facility]  Exercises - Forward Backward Weight Shift with Counter Support  - 1 x daily - 5 x weekly - 2 sets - 10 reps - Side to Side Weight Shift with Counter Support  - 1 x daily - 5 x weekly - 2 sets - 10 reps - Mini Squat with Counter Support  - 1 x daily - 5 x weekly - 2 sets - 10 reps - Stride Stance Weight Shift  - 1 x daily - 5 x weekly - 2 sets - 10 reps - Sit to stand in stride stance  - 2 x daily - 7 x weekly - 1-2 sets - 5 reps - Supine Bridge with Mini Swiss Ball Between Knees  - 1 x daily - 5 x weekly - 2 sets - 10 reps - Supine Heel Slide  - 1 x daily - 5 x weekly - 2 sets - 10 reps -  Seated Isometric Hip Adduction with Ball  - 1 x daily - 5 x weekly - 2 sets - 10 reps - 3 sec hold    Below measures were taken at time of initial evaluation unless otherwise specified:     DIAGNOSTIC FINDINGS: Follow-up MRI brain done revealing small areas of acute infarct in left-MCA territory with mild hemorrhagic transformation in left parietal lobe and left-MCA bifurcation residual vascular thrombus   COGNITION: Overall cognitive status:  Difficult to assess due to aphasia   SENSATION: Light touch: Impaired  Proprioception: Impaired   COORDINATION: RLE impaired, some deficits on left foot with alternating movements and isolated movements  EDEMA:  None   MUSCLE TONE: RLE: Modifed Ashworth Scale 2 = More marked  increase in muscle tone through most of the ROM, but affected part(s) easily moved right quad most affected, no clonus right plantarflexors   MUSCLE LENGTH: Able to achieve full right knee extension to PROM  DTRs:  Patella 3+ = Brisk  POSTURE: No Significant postural limitations  LOWER EXTREMITY ROM:     LLE WNL, RLE AROM limited by weakness  LOWER EXTREMITY MMT:    MMT Right Eval Left Eval  Hip flexion 3- 5  Hip extension    Hip abduction 3- 4  Hip adduction 3- 5  Hip internal rotation    Hip external rotation    Knee flexion 3 5  Knee extension 2+ 5  Ankle dorsiflexion 0 5  Ankle plantarflexion 1 3+  Ankle inversion    Ankle eversion    (Blank rows = not tested)  BED MOBILITY:  NT  TRANSFERS: Assistive device utilized: Hemi walker  Sit to stand: Modified independence Stand to sit: Modified independence Chair to chair: Modified independence Floor:  Total assist --mod A for half kneeling  RAMP:  Level of Assistance: CGA Assistive device utilized: Hemi walker Ramp Comments:   CURB:  Level of Assistance: Min A Assistive device utilized: Education officer, environmental Comments: cues in sequence  STAIRS:  Level of Assistance: SBA  Stair  Negotiation Technique: Step to Pattern with Single Rail on Left  Number of Stairs: 15   Height of Stairs: 4-6"  Comments: cues and guarding  GAIT: Gait pattern: step to pattern, decreased ankle dorsiflexion- Right, and circumduction- Right Distance walked: 150 Assistive device utilized: Quad cane large base Level of assistance: Modified independence Comments: deficits during turning   FOTO 47%  TODAY'S TREATMENT:  assessment   PATIENT EDUCATION: Education details: assessment findings, PT scope of practice Person educated: Patient and Spouse Education method: Explanation Education comprehension: verbalized understanding     GOALS: Goals reviewed with patient? Yes  SHORT TERM GOALS: Target date: 01/13/2023      Patient will perform Advanced HEP with family/caregiver supervision for improved strength, balance, transfers, and gait  Baseline: indep with initial HEP Goal status: INITIAL  2.  Demo improved safety and ability to access home environment ambulating grass and uneven surfaces w/ SBA and least restrictive AD Baseline: CGA-min A grass/slopes/gravel Goal status: IN PROGRESS   4. Demonstrate improved gait speed to 1.8 ft/sec with right AFO and least restrictive AD to improve efficiency of gait  Baseline: 0.68 ft/sec at eval; (09/20/22) 1.59 ft/sec w/ platform RW and AFO; (12/02/22) 1.4 ft/sec w/ WBQC and AFO  Goal status: IN PROGRESS  5. Improve mobility and reduce level of assist performing floor to stand transfers w/ SBA  Baseline: max A  Goal status: IN PROGRESS  LONG TERM GOALS: Target date: 02/24/2023    Demonstrate improved functional status and quality of life meeting FOTO predicted outcomes (59% predicted) Baseline: 47% Goal status:IN PROGRESS  2.  Demonstrate modified independent ambulation x 300 ft uneven surfaces using least restrictive AD in order to improve safety with home environment Baseline: CGA-min A w/ WBQC Goal status: IN PROGRESS  3.   Manifest improved safety with gait per time of 18 sec TUG test using least restrictive AD Baseline: 34.75 sec w/ HW; (09/20/22) 30.69 w/ HW and right AFO; (12/02/22) 23 sec w/ WBQC Goal status: IN PROGRESS   4. Stair ambulation x 15 steps with set-up assist to improve safety in home environment and reduce burden of care  Baseline: SBA with single HR, using AFO; (12/02/22) supervision  Goal status: On-going      ASSESSMENT:  CLINICAL IMPRESSION: Recertification performed today reviewing STG/LTG and revision of POC as pt has been able to meet several POC details manifesting improved functional status and has progressed with functional mobility to the point of using [facility] and right PLS AFO vs previous HW and need for CGA on level surfaces.  Demonstrates low risk for falls per score 49/56 Berg Balance Test which is improved from previous 42/56 and decreased TUG test time by 7 seconds to new personal best of 23 sec.  Demonstrates improved functional mobility and status performing household ambulation at a modified independent level using [facility] and orthotic.  Patient has great potential to improve functional status and would benefit from continued PT services to address newly outlined STG/LTG   OBJECTIVE IMPAIRMENTS Abnormal gait, decreased activity tolerance, decreased balance, decreased coordination, decreased knowledge of use of DME, decreased mobility, difficulty walking, decreased strength, impaired sensation, impaired tone, impaired UE functional use, and improper body mechanics.   ACTIVITY LIMITATIONS carrying, lifting, bending, standing, squatting, stairs, transfers, bathing, toileting, dressing, reach over head, and locomotion level  PARTICIPATION LIMITATIONS: meal prep, cleaning, laundry, interpersonal relationship, driving, shopping, community activity, occupation, yard work, and leisure activities  [place] Time since onset of injury/illness/exacerbation are also affecting patient's  functional outcome.   REHAB POTENTIAL: Excellent  CLINICAL DECISION MAKING: Evolving/moderate complexity  EVALUATION COMPLEXITY: Moderate  PLAN: PT FREQUENCY: 1-2/wk  PT DURATION: 12 weeks  PLANNED INTERVENTIONS: Therapeutic exercises, Therapeutic activity, Neuromuscular re-education, Balance training, Gait training, Patient/Family education, Self Care, Joint mobilization, Stair training, Vestibular training, Canalith repositioning, Orthotic/Fit training, DME instructions, Aquatic Therapy, Dry Needling, Electrical stimulation, Spinal mobilization, Cryotherapy, Moist heat, Splintting, Taping, and Manual therapy  PLAN FOR NEXT SESSION: outdoor quad cane gait training including ramps, curbs, trial SPC? hip ABD strengthening , half kneeling with folded gym mat to progress floor to stand    5:14 PM, 12/02/22 M. Sherlyn Lees, PT, DPT Physical Therapist- [facility] Office Number: [phone]  [place] [facility] [address] [place], [place] [address] Phone # [phone] Fax # [phone]

## 2022-12-03 NOTE — Therapy (Signed)
OUTPATIENT SPEECH LANGUAGE PATHOLOGY TREATMENT   Patient Name: Gordon Fisher MRN: [id number] DOB:[birth year], 62 y.o., male Today's Date: 12/03/2022  PCP: TBD REFERRING PROVIDER: Fanny Dance, MD    End of Session - 12/03/22 0027     Visit Number 16    Number of Visits 31    Date for SLP Re-Evaluation 02/01/23    Authorization - Visit Number 2    Authorization - Number of Visits 20    SLP Start Time 1535    SLP Stop Time  1615    SLP Time Calculation (min) 40 min    Activity Tolerance Patient tolerated treatment well                      Past Medical History:  Diagnosis Date   Stroke [facility])    Past Surgical History:  Procedure Laterality Date   BUBBLE STUDY  02/15/2022   Procedure: BUBBLE STUDY;  Surgeon: Christell Constant, MD;  Location: [facility] ENDOSCOPY;  Service: Cardiovascular;;   IR CT HEAD LTD  02/11/2022   IR CT HEAD LTD  02/11/2022   IR CT HEAD LTD  02/11/2022   IR PERCUTANEOUS ART THROMBECTOMY/INFUSION INTRACRANIAL INC DIAG ANGIO  02/11/2022   IR PERCUTANEOUS ART THROMBECTOMY/INFUSION INTRACRANIAL INC DIAG ANGIO  02/11/2022   IR US GUIDE VASC ACCESS RIGHT  02/11/2022   IR US GUIDE VASC ACCESS RIGHT  02/11/2022   RADIOLOGY WITH ANESTHESIA N/A 02/11/2022   Procedure: IR WITH ANESTHESIA;  Surgeon: Radiologist, Medication, MD;  Location: [facility] OR;  Service: Radiology;  Laterality: N/A;   RADIOLOGY WITH ANESTHESIA N/A 02/11/2022   Procedure: RADIOLOGY WITH ANESTHESIA;  Surgeon: Radiologist, Medication, MD;  Location: [facility] OR;  Service: Radiology;  Laterality: N/A;   TEE WITHOUT CARDIOVERSION N/A 02/15/2022   Procedure: TRANSESOPHAGEAL ECHOCARDIOGRAM (TEE);  Surgeon: Christell Constant, MD;  Location: [facility] ENDOSCOPY;  Service: Cardiovascular;  Laterality: N/A;   Patient Active Problem List   Diagnosis Date Noted   Hypocalcemia 03/22/2022   DVT, lower extremity, distal (HCC) 03/22/2022   Obesity 03/22/2022   Elevated LDL cholesterol level 03/22/2022   Acute  ischemic left middle cerebral artery (MCA) stroke (HCC) 02/18/2022   Cerebrovascular accident (CVA) due to occlusion of left middle cerebral artery (HCC) 02/11/2022    ONSET DATE: 02-11-22   REFERRING DIAG: I63.9 (ICD-10-CM) - Cerebrovascular accident (CVA), unspecified mechanism (HCC) R13.10 (ICD-10-CM) - Dysphagia, unspecified type   THERAPY DIAG:  Verbal apraxia  Aphasia  Rationale for Evaluation and Treatment Rehabilitation  SUBJECTIVE:   SUBJECTIVE STATEMENT: "Yeah."  Pt accompanied by: significant other  PERTINENT HISTORY: Gordon Fisher is a 62 year old male in relatively good health who was admitted via [facility] on 02/11/22 with right sided weakness, right facial droop and slurred speech. CTH showed L-MCA hyperdense sign and he received TNK prior to transfer. CTA showed L-MCA MI occlusion and he underwent cerebral angio with thrombectomy and TICI 3 flow by Dr. Lonzo Candy Sherlon Handing.  Follow up MRI brain done revealing small areas of acute infarct infarct in L-MCA territory with mild hemorrhagic transformation in left parietal lobe and L-MCA bifurcation residual vascular thrombus. He was taken back for thrombectomy with stent for dissection but had intra-stent occlusion which was unable to be recanalized. Post op CT showed small SAH which was stable on follow up. He was started on ASA/Brillinta. MBS 04/18 showed oral dysphagia with oral delay and no penetration or aspiration. He was started on D3, thins with supervision. Repeat MRI brain  on 04/16 showed extensive evolving acute ischemic L-MCA occlusion increased in size with associated edema, few additional small acute cortical subcortical infarcts involving contralateral right frontal lobe and left ACA distribution. Stroke felt to be due to unknown etiology and wife elected on Zio patch on discharge to rule out A fib. 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 [name] was d/c'd from [facility] on 03-19-22, and has had HHST since that time until last week.   PAIN:  Are you having pain? No  PATIENT GOALS Pt indicated he would like talking to improve  OBJECTIVE:  DIAGNOSTIC FINDINGS: MRI without contrast 02/13/22 IMPRESSION: 1. Extensive evolving acute ischemic left MCA distribution infarct, increased in size as compared to previous MRI from 02/11/2022. Associated edema without significant midline shift. 2. Small volume acute subarachnoid hemorrhage at the posterior aspect of the left Sylvian fissure, stable. 3. Few additional small volume acute ischemic cortical and subcortical infarcts involving the contralateral right frontal lobe and left ACA distribution as above. 4. Underlying chronic microvascular ischemic disease.    PATIENT REPORTED OUTCOME MEASURES (PROM): Communication Effectiveness Survey: Pt scored 8/32 - higher scores indicate more effective communication/QOL.   TODAY'S TREATMENT:  12/02/22: Pt entered room with device and indicated correctly the people who were at his house yesterday. He was independent with accessing vegetables he will grown in the spring. SLP assisted (mod A occasionally) pt in programming icon for "seed" and "leaf lettuce". Pt req'd mod -max cues for adding icons. Max/total A for spelling icon names.  11/23/22: Pt has not been seen in over one month - verbal and receptive language skills have minimally improved. Pt has not spontaneously used device at home - however has used device when (rarely) prompted by wife. [name] has programmed device a little with farm items and garden items. SLP assisted pt and wife to relocate icons to more proper location/s. Pt req'd initial demo cues faded to occasional mod cues for editing icons' pictures and sound. With text, pt req'd faded cues to occasional min cues.  Continue all goals not met. Pt verbal expression improved dramatically when looking at picture and seeing  icon name.   12/18/23[name] made some icons last week. Realized she had duplicated them when she saw them on another page and realized she had put some icons on the wrong page. [name] is becoming more independent with deleting icons and is independent with moving icons up and down the page. He still requires occasional min A with adding and modifying icons but is improving with this as his exposure with the process continues. Pt currently without internet so he cannot search for icons or back up device at home.   10/11/22: SLP began educating pt/wife how to create icons using camera, altered pronunciation text field for "leesa", and searching online for photo. By session end pt was accessing orange three-bar icon in upper right hand corner independently when cued "what do you press to modify or add icons?", and moving icons independently. Pt and wife to make icons for family, and use for communicating food choices.  10-08-22: Lingraphica start trial date today. SLP introduced device to pt/wife. Pt with functional verbalization within 3 seconds of initiation for "ham", "tomato", "cheese", "[name]", and "sandwich". Pt produced "sandwich" spontaneously after 8 seconds and mutliple attempts, without label. SLP introduced how to move, add, and delete icons. Pt communicated his sandwich choice ("sandwich meat" originally, and then "ham" after  it was added to page), and toppings choices (tomato, cheese) with consistent min-mod cues, faded to occasional min-mod cues. Pt and wife to use device over the weekend for food - - SLP to assist/teach pt and [name] how to add pictures to icons for family members next session.  09/27/22: Lingraphica not yet sent from [facility]. Should be here by pt's next appointment.  [name] told SLP names of people at their Thanksgiving celebration, with usual mod to max assist. Visual cues were most effective on pt's most difficult words. SLP assessed pt's reading comprehension - 80% with 4-5 words.    11/8/23[name] stated they had been busy this week with appointments but still work at practicing some. SLP initiated a 20-minute discussion today with Evette Doffing, and SLP explaining speech generating device in depth, showing [name] and [name] options for trial device and deciding on the Lingraphica Touch Talk. SLP to make referral for this in the next few days.  Family names were written with mod-max A usually - pt got first 3 letters correct with childrens and grandchildrens' names. SLP showed [name] how to break up daughter's name to Je-sick-[id number] to encourage brain's processing in a different way in order to foster new neural pathways to say the name.  09/06/22: SLP targeted verbal expression with picture naming and divergent naming. Pt notably demonstrates he knows the word/s he wants to say but is finding it difficult to pronounce them (verbal apraxia). Pt's expression improves with visual cues (oral cues from SLP for initial phoneme/sound) - 75% successful even without sound necessary - just visual cue for articulator placement.   09/01/22: SLP worked with pt's verbal expression and auditory comprehension today. Simple conversation/sentences were understood 90% of the time, 100% with one repeat by speaker. With opposites/paired words, pt had 70% success, improved to 90% with min A. With cloze phrases/sentences pt's response accuracy improved with 1 syllable words, but overall success was 17% with min A, improved to 50% with mod-max A. Often, pt knew the response but knew he was going to misarticulate the response. Written cues were helpful for pt here. SLP encouraged wife to cont with cloze phrases and paired words at home.  10/25/23[name] brought pt's cards today and SLP reviewed them with pt, modleing to wife cueing methods and hierarchy.Pt functionally said 6/7 salient objects (without written words) with mod-max cues, and 1/7 spontaneously. He said family names with written cues 6/7 (functionally),  and 1/7 wth max cues Janett Billow"). Pt demo'd understnading of directions in context 75% today. SLP introduce possibility of Lingraphica to pt and wife and educated about studies proving that AAC assists verbal communication and that pt may not have to have assistance with a AAC device but we will know more as time progresses.   08/23/22: SLP targeted verbal expression with pt today. Heavy perseveration with "[name]" grandson's name for all J names. SLP even did months of the year and pt cont to have perseveration with [name] for all J names. "[name]" - pt cont to make this into two syllables 90% of the time, with consistent max A. Pt used multi-modal communization using chicken clucks with SLP assistance and encouragement when pt could not say "chickens", and pointed to ages of his grandchildren with SLP writing numbers 1-10. Mod-max A needed for grandson 2 months old. Pt showed 1 and then gestured taking a short person's height for SLP to know young child. MAX A needed for month names > 1 syllable. With one syllable months pt 80%  success. Pt told SLP name of his truck, and functionally told SLP name of one of his two tractors. Req'd mod/max A otherwise. Pt to cont to practice salient/high frequency/high importance words.   08/18/22: SLP targeted spelling today with pt - 3 letter words 100%, 4-letter words 80% - [name] stated pt had s/sx dyslexia prior to CVA. SLP to monitor this. SLP provided some other hints/helpful suggestions for home practice and communication at home today. SLP noted pt read 3 and 4 letter words 50% accuracy when spelled correctly.   08/16/22: SLP assessed pt's reading comprehension - pt reading single words 6/6 and simple sentences of 6-9 words ("the boy is riding his bike in the park") 6/6. [name] said pt having difficulty reading newspaper from home. SLP did reading task (newspaper) with pt today. SLP highlighted key words for pt and he answered 4/6 questions correct by pt answering yes/no  and pointing to correct portion in the article to indicate place where the answer was found. Pt will return CES next session or two.    PATIENT EDUCATION: Education details: see "Today's therapy" Person educated: Patient and Spouse Education method: Explanation and Demonstration Education comprehension: verbalized understanding and needs further education   GOALS: Goals reviewed with patient? Yes, in general  SHORT TERM GOALS: Target date: 08/31/2022 extended one week due to visits  Pt will name 6 salient family names when given picture and written name with consistent mod-max cues in 3 sessions (precursor for speech generating device) Baseline:08-25-22. 09-01-22 Goal status: partially met  2.  Pt will produce simple phoneme/ bilabial+/a/  /a/, /ba/, /ma/, and/or /pa/ simultaneously with SLP 65% success (equal # reps/stimulus) in 2 sessions Baseline:  Goal status: deferred - pt to focus on salient items/terms  3.  Pt will demo understanding of simple commands in context 80% over three sessions Baseline: 08-25-22, 09-01-22 Goal status: Partially met  4.  Pt will indicate understanding of a complex sentence 85% of the time in 3 sessions (precursor for speech generating device) Baseline:  Goal status: not met  5.  Pt will copy written family names with 50% success in two sessions  Baseline:  Goal Status: met   LONG TERM GOALS: Target date: 10/24/2022 , 02/01/23  Pt will name 6 salient family names when given picture and written name with consistent mod cues in 3 sessions (precursor for speech generating device) Baseline:  Goal status: not met, and ongoing  2.  Pt will IMITATE simple phoneme/bilabial+/a/  /a/, /ba/, /ma/, and/or /pa/  65% success (equal # reps/stimulus) in 2 sessions Baseline:  Goal status: deferred - working with pt on more salient words  3.  Pt will generate meaningful message understood by [name] using speech generating device 60% of the time in 3  sessions Baseline:  Goal status: Not met, and Ongoing  4.  [name] will indicate that spontaneous communication is easier than during week of evaluation Baseline:  Goal status: Not met, and Ongoing   ASSESSMENT:  CLINICAL IMPRESSION: [name] is a 62 y.o. male who cont to be seen today for treatment of verbal apraxia (as pt often indicates he knows the response but knows it will not be articulated correctly) and aphasia. [place] education continued today. Pt will be receiving his device in the next ~2 weeks and SLP to cont with training of pt and wife for basic programming and usage of device SEE TX NOTE from today for more details.   OBJECTIVE IMPAIRMENTS include expressive language, receptive language, and aphasia.  These impairments are limiting patient from return to work, managing medications, managing appointments, household responsibilities, ADLs/IADLs, and effectively communicating at home and in community. Factors affecting potential to achieve goals and functional outcome are ability to learn/carryover information and severity of impairments. Patient will benefit from skilled SLP services to address above impairments and improve overall function.  REHAB POTENTIAL: Good  PLAN: SLP FREQUENCY: 2x/week  SLP DURATION: 8 weeks  PLANNED INTERVENTIONS: Language facilitation, Environmental controls, Cueing hierachy, Internal/external aids, Functional tasks, and Multimodal communication approach    Gene Colee, CCC-SLP 12/03/2022, 12:28 AM

## 2022-12-10 ENCOUNTER — Inpatient Hospital Stay: Payer: 59 | Attending: Hematology | Admitting: Hematology

## 2022-12-10 ENCOUNTER — Inpatient Hospital Stay: Payer: 59

## 2022-12-10 VITALS — BP 114/73 | HR 80 | Temp 98.0°F | Resp 17 | Ht 68.0 in | Wt 159.2 lb

## 2022-12-10 DIAGNOSIS — Z7901 Long term (current) use of anticoagulants: Secondary | ICD-10-CM | POA: Diagnosis not present

## 2022-12-10 DIAGNOSIS — Z86718 Personal history of other venous thrombosis and embolism: Secondary | ICD-10-CM | POA: Diagnosis not present

## 2022-12-10 DIAGNOSIS — I824Z1 Acute embolism and thrombosis of unspecified deep veins of right distal lower extremity: Secondary | ICD-10-CM

## 2022-12-10 DIAGNOSIS — I639 Cerebral infarction, unspecified: Secondary | ICD-10-CM | POA: Insufficient documentation

## 2022-12-10 DIAGNOSIS — I63512 Cerebral infarction due to unspecified occlusion or stenosis of left middle cerebral artery: Secondary | ICD-10-CM

## 2022-12-10 LAB — D-DIMER, QUANTITATIVE: D-Dimer, Quant: <0.27 ug/mL-FEU (ref 0.00–0.50)

## 2022-12-10 NOTE — Patient Instructions (Addendum)
[facility]  Discharge Instructions  You were seen and examined today by Dr. Delton Coombes. Dr. Delton Coombes is a hematologist, meaning that he specializes in blood abnormalities. Dr. Delton Coombes discussed your past medical history, family history of cancers/blood conditions and the events that led to you being here today.  You were referred to Dr. Delton Coombes due to blood clots. Continue Eliquis as prescribed.  Dr. Delton Coombes has recommended additional labs today.  Follow-up as scheduled.  Thank you for choosing [facility] to provide your oncology and hematology care.   To afford each patient quality time with our provider, please arrive at least 15 minutes before your scheduled appointment time. You may need to reschedule your appointment if you arrive late (10 or more minutes). Arriving late affects you and other patients whose appointments are after yours.  Also, if you miss three or more appointments without notifying the office, you may be dismissed from the clinic at the provider's discretion.    Again, thank you for choosing [facility].  Our hope is that these requests will decrease the amount of time that you wait before being seen by our physicians.   If you have a lab appointment with the [facility] please come in thru the Main Entrance and check in at the main information desk.           _____________________________________________________________  Should you have questions after your visit to [facility], please contact our office at [phone] and follow the prompts.  Our office hours are 8:00 a.m. to 4:30 p.m. Monday - Thursday and 8:00 a.m. to 2:30 p.m. Friday.  Please note that voicemails left after 4:00 p.m. may not be returned until the following business day.  We are closed weekends and all major holidays.  You do have access to a nurse 24-7, just call the main number to the clinic  [phone] and do not press any options, hold on the line and a nurse will answer the phone.    For prescription refill requests, have your pharmacy contact our office and allow 72 hours.    Masks are optional in the cancer centers. If you would like for your care team to wear a mask while they are taking care of you, please let them know. You may have one support person who is at least 61 years old accompany you for your appointments.

## 2022-12-10 NOTE — Progress Notes (Signed)
[place], [place] [address]   Clinic Day:  12/10/2022  Referring physician: [facility]  Patient Care Team: Pcp, No as PCP - General Vickie Epley, MD as PCP - Electrophysiology (Cardiology) Derek Jack, MD as Medical Oncologist (Hematology)   ASSESSMENT & PLAN:   Assessment:  1.  Cryptogenic stroke: - Left MCA infarct due to left M1 occlusion, status post TNK and thrombectomy x 2, stent placement for left M2 dissection followed by stent reocclusion, etiology unclear - 2D echo, EF 60-65%, interatrial septum not well-visualized. - TEE (02/15/2022):-Negative bubble study, no cardiac thrombus.  No evidence of atrial fibrillation or arrhythmias by loop recorder. - Started on aspirin 81 mg and Brilinta 90 mg twice daily, aspirin stopped when Eliquis was started for DVT.  Brilinta discontinued in October 2023 and he is currently on aspirin and Eliquis. - 25 pound weight loss since October 2023.  He stopped drinking soda and eating sweets.  2.  Provoked right leg distal DVT: - Doppler on 03/05/2022: Acute DVT involving the right gastrinomas veins, right peroneal veins - Doppler on 03/10/2022: Acute DVT involving right gastrinomas, right peroneal veins. - Doppler on 05/10/2022: Age-indeterminate DVT involving right peroneal vein.  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 - Currently on Eliquis twice daily.  3.  Social/family history: - Lives at home with his wife.  He worked in [employer], special forces.  He was stationed in [place] and also in the [employer].  Exposure to burnt pits.  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 - No family history of DVT.  Sister had mild CVA at a young age.  Maternal grandmother had cancer.  Son has tuberosclerosis.   Plan:  1.  Unprovoked right leg distal DVT: - Will repeat right lower extremity Doppler and a D-dimer. - Will check lupus  anticoagulant, anticardiolipin antibodies and antibeta 2 glycoprotein 1 antibodies. - Will also check factor V Leiden and prothrombin gene mutation and JAK2 V617F mutation for further workup of idiopathic stroke. - Even if his Doppler is negative, I think he will benefit from long-term anticoagulation due to decreased mobility from stroke.  We may consider discontinuing anticoagulation down the line if there is near complete recovery of right hemiparesis. - Continue antiplatelet therapy with low-dose aspirin.  Brilinta was discontinued. - RTC 4 weeks for follow-up.   Orders Placed This Encounter  Procedures   US Venous Img Lower Unilateral Right    Standing Status:   Future    Standing Expiration Date:   12/10/2023    Order Specific Question:   Reason for Exam (SYMPTOM  OR DIAGNOSIS REQUIRED)    Answer:   previous DVT    Order Specific Question:   Preferred imaging location?    Answer:   [facility]    Order Specific Question:   Release to patient    Answer:   Immediate   Lupus anticoagulant panel    Standing Status:   Future    Number of Occurrences:   1    Standing Expiration Date:   12/10/2023   Beta 2 microglobulin, serum    Standing Status:   Future    Number of Occurrences:   1    Standing Expiration Date:   12/10/2023   Cardiolipin antibodies, IgG, IgM, IgA    Standing Status:   Future    Number of Occurrences:   1  Standing Expiration Date:   12/10/2023   D-dimer, quantitative    Standing Status:   Future    Number of Occurrences:   1    Standing Expiration Date:   12/10/2023   Factor 5 leiden    Standing Status:   Future    Number of Occurrences:   1    Standing Expiration Date:   12/10/2023   Prothrombin gene mutation    Standing Status:   Future    Number of Occurrences:   1    Standing Expiration Date:   12/10/2023   Rheumatoid factor    Standing Status:   Future    Number of Occurrences:   1    Standing Expiration Date:   12/10/2023   JAK2 genotypr    Standing  Status:   Future    Number of Occurrences:   1    Standing Expiration Date:   12/10/2023   Cologuard    Order Specific Question:   Release to patient    Answer:   Immediate      I,Alexis Herring,acting as a scribe for Derek Jack, MD.,have documented all relevant documentation on the behalf of Derek Jack, MD,as directed by  Derek Jack, MD while in the presence of Derek Jack, MD.   I, Derek Jack MD, have reviewed the above documentation for accuracy and completeness, and I agree with the above.   Derek Jack, MD   2/9/20245:25 PM  CHIEF COMPLAINT/PURPOSE OF CONSULT:   Diagnosis: DVT/stroke  Current Therapy:  Eliquis  HISTORY OF PRESENT ILLNESS:   Gordon Fisher is a 62 y.o. male presenting to clinic today for evaluation of history of DVT and stroke. Patient was referred by his [place] provider.  Today, he states that he is doing okay overall. His appetite level is at 100%. His energy level is at 75%.   02/11/2022 he had a left MCA CVA with right-sided deficits. He continues to have tingling sensation on right side of body as well as expressive aphasia. He was started on Eliquis for DVT of right leg 03/2022, but never had a blood clot prior to this. He is currently undergoing PT 1 day a week and doing home exercises x2-4 times a week. [facility] and showed [facility].    His sister had a mild stroke. His maternal grandma had an unknown type cancer. He denies any other family history of cancer or clotting disorders. His son has tuberous sclerosis. He is unaware of any miscarriage history in his family history. He has no family history of lupus.   Since his stroke, he spends more time watching TV and taking naps. However, he does get up every few hours or more to walk around. His grandchildren stay with him and his wife several days a week so he stays active with them.  He has no history of smoking.  He has lost 25 lbs since 08/2022. He no longer drinks sodas or eats sweets since his stroke. He has no current fevers, night sweats, difficulty swallowing, or leg swelling.   He has never had a colonoscopy. He denies any history of HTN.  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 He was around burn pits but denies any other known chemical exposures. 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  PAST MEDICAL HISTORY:   Past Medical History: Past Medical History:  Diagnosis Date   Stroke [facility])     Surgical History: Past Surgical History:  Procedure Laterality Date   BUBBLE STUDY  02/15/2022   Procedure: BUBBLE STUDY;  Surgeon: Werner Lean, MD;  Location: [facility];  Service: Cardiovascular;;   IR CT HEAD LTD  02/11/2022   IR CT HEAD LTD  02/11/2022   IR CT HEAD LTD  02/11/2022   IR PERCUTANEOUS ART THROMBECTOMY/INFUSION INTRACRANIAL INC DIAG ANGIO  02/11/2022   IR PERCUTANEOUS ART THROMBECTOMY/INFUSION INTRACRANIAL INC DIAG ANGIO  02/11/2022   IR US GUIDE VASC ACCESS RIGHT  02/11/2022   IR US GUIDE VASC ACCESS RIGHT  02/11/2022   RADIOLOGY WITH ANESTHESIA N/A 02/11/2022   Procedure: IR WITH ANESTHESIA;  Surgeon: Radiologist, Medication, MD;  Location: [facility];  Service: Radiology;  Laterality: N/A;   RADIOLOGY WITH ANESTHESIA N/A 02/11/2022   Procedure: RADIOLOGY WITH ANESTHESIA;  Surgeon: Radiologist, Medication, MD;  Location: [facility];  Service: Radiology;  Laterality: N/A;   TEE WITHOUT CARDIOVERSION N/A 02/15/2022   Procedure: TRANSESOPHAGEAL ECHOCARDIOGRAM (TEE);  Surgeon: Werner Lean, MD;  Location[facility] ENDOSCOPY;  Service: Cardiovascular;  Laterality: N/A;    Social History: Social History   Socioeconomic History   Marital status: Married    Spouse name: [name]   Number of children: 2   Years of education: Not on file   Highest education level: Associate degree: occupational, Hotel manager, or vocational  program  Occupational History   Not on file  Tobacco Use   Smoking status: Never    Passive exposure: Never   Smokeless tobacco: Never  Vaping Use   Vaping Use: Never used  Substance and Sexual Activity   Alcohol use: Never   Drug use: Never   Sexual activity: Never  Other Topics Concern   Not on file  Social History Narrative   04/27/22 lives with wife   Social Determinants of Health   Financial Resource Strain: Not on file  Food Insecurity: No Food Insecurity (12/10/2022)   Hunger Vital Sign    Worried About Running Out of Food in the Last Year: Never true    Ran Out of Food in the Last Year: Never true  Transportation Needs: No Transportation Needs (12/10/2022)   PRAPARE - Hydrologist (Medical): No    Lack of Transportation (Non-Medical): No  Physical Activity: Not on file  Stress: Not on file  Social Connections: Not on file  Intimate Partner Violence: Not At Risk (12/10/2022)   Humiliation, Afraid, Rape, and Kick questionnaire    Fear of Current or Ex-Partner: No    Emotionally Abused: No    Physically Abused: No    Sexually Abused: No    Family History: Family History  Problem Relation Age of Onset   Thyroid disease Mother     Current Medications:  Current Outpatient Medications:    apixaban (ELIQUIS) 5 MG TABS tablet, TAKE ONE (1) TABLET BY MOUTH TWO (2) TIMES DAILY, Disp: 60 tablet, Rfl: 0   atorvastatin (LIPITOR) 40 MG tablet, Take 1 tablet (40 mg total) by mouth every evening., Disp: 30 tablet, Rfl: 1  Current Facility-Administered Medications:    diclofenac Sodium (VOLTAREN) 1 % topical gel 2 g, 2 g, Topical, QID, Jennye Boroughs, MD   Allergies: No Known Allergies  REVIEW OF SYSTEMS:   Review of Systems  Constitutional:  Negative for chills, fatigue and fever.  HENT:   Negative for lump/mass, mouth sores, nosebleeds, sore throat and trouble swallowing.  Respiratory:  Negative for cough and shortness of breath.    Cardiovascular:  Negative for chest pain, leg swelling and palpitations.  Gastrointestinal:  Negative for abdominal pain, constipation, diarrhea, nausea and vomiting.  Genitourinary:  Negative for bladder incontinence, difficulty urinating, dysuria, frequency, hematuria and nocturia.   Musculoskeletal:  Negative for arthralgias, back pain, flank pain, myalgias and neck pain.  Skin:  Negative for itching and rash.  Neurological:  Positive for extremity weakness (right-sided) and numbness (R side tingling). Negative for dizziness and headaches.       + expressive aphasia  Hematological:  Does not bruise/bleed easily.  Psychiatric/Behavioral:  Negative for depression, sleep disturbance and suicidal ideas. The patient is not nervous/anxious.   All other systems reviewed and are negative.    VITALS:   Blood pressure 114/73, pulse 80, temperature 98 F (36.7 C), temperature source Oral, resp. rate 17, height 5' 8"$  (1.727 m), weight 159 lb 3.2 oz (72.2 kg), SpO2 99 %.  Wt Readings from Last 3 Encounters:  12/10/22 159 lb 3.2 oz (72.2 kg)  11/08/22 174 lb (78.9 kg)  08/06/22 184 lb 6.4 oz (83.6 kg)    Body mass index is 24.21 kg/m.  PHYSICAL EXAM:   Physical Exam Vitals and nursing note reviewed. Exam conducted with a chaperone present.  Constitutional:      Appearance: Normal appearance.  Cardiovascular:     Rate and Rhythm: Normal rate and regular rhythm.     Pulses: Normal pulses.     Heart sounds: Normal heart sounds.  Pulmonary:     Effort: Pulmonary effort is normal.     Breath sounds: Normal breath sounds.  Abdominal:     Palpations: Abdomen is soft. There is no hepatomegaly, splenomegaly or mass.     Tenderness: There is no abdominal tenderness.  Lymphadenopathy:     Cervical: No cervical adenopathy.     Right cervical: No superficial cervical adenopathy.    Left cervical: No superficial cervical adenopathy.  Neurological:     General: No focal deficit present.      Mental Status: He is alert and oriented to person, place, and time.     Motor: Weakness present.     Comments: Right upper extremity muscle strength is 1/5.    Psychiatric:        Mood and Affect: Mood normal.        Behavior: Behavior normal.     LABS:      Latest Ref Rng & Units 03/15/2022    6:31 AM 03/08/2022    5:28 AM 03/05/2022    5:56 PM  CBC  WBC 4.0 - 10.5 K/uL 7.1  5.7  15.4   Hemoglobin 13.0 - 17.0 g/dL 15.3  14.5  15.6   Hematocrit 39.0 - 52.0 % 43.3  41.7  44.4   Platelets 150 - 400 K/uL 230  184  205       Latest Ref Rng & Units 03/15/2022    6:31 AM 03/08/2022    5:28 AM 03/01/2022    6:01 AM  CMP  Glucose 70 - 99 mg/dL 107  125  112   BUN 6 - 20 mg/dL 13  12  18   $ Creatinine 0.61 - 1.24 mg/dL 1.12  1.04  1.08   Sodium 135 - 145 mmol/L 135  135  137   Potassium 3.5 - 5.1 mmol/L 3.7  3.7  3.7   Chloride 98 - 111 mmol/L 105  106  105   CO2 22 -  32 mmol/L 23  23  24   $ Calcium 8.9 - 10.3 mg/dL 8.9  8.9  8.8      No results found for: "CEA1", "CEA" / No results found for: "CEA1", "CEA" No results found for: "PSA1" No results found for: "[id number]" No results found for: "CAN125"  No results found for: "TOTALPROTELP", "ALBUMINELP", "A1GS", "A2GS", "BETS", "BETA2SER", "GAMS", "MSPIKE", "SPEI" No results found for: "TIBC", "FERRITIN", "IRONPCTSAT" No results found for: "LDH"   STUDIES:   CUP PACEART REMOTE DEVICE CHECK  Result Date: 11/12/2022 ILR summary report received. Battery status OK. Normal device function. No new symptom, tachy, brady, or pause episodes. No new AF episodes. Monthly summary reports and ROV/PRN LA

## 2022-12-11 LAB — LUPUS ANTICOAGULANT PANEL
DRVVT: 51.3 s — ABNORMAL HIGH (ref 0.0–47.0)
PTT Lupus Anticoagulant: 35.7 s (ref 0.0–43.5)

## 2022-12-11 LAB — DRVVT CONFIRM: dRVVT Confirm: 1 ratio (ref 0.8–1.2)

## 2022-12-11 LAB — DRVVT MIX: dRVVT Mix: 46 s — ABNORMAL HIGH (ref 0.0–40.4)

## 2022-12-11 LAB — BETA 2 MICROGLOBULIN, SERUM: Beta-2 Microglobulin: 1.7 mg/L (ref 0.6–2.4)

## 2022-12-12 LAB — RHEUMATOID FACTOR: Rheumatoid fact SerPl-aCnc: 10.5 IU/mL (ref <14.0)

## 2022-12-12 LAB — CARDIOLIPIN ANTIBODIES, IGG, IGM, IGA
Anticardiolipin IgA: <9 APL U/mL (ref 0–11)
Anticardiolipin IgG: <9 GPL U/mL (ref 0–14)
Anticardiolipin IgM: <9 MPL U/mL (ref 0–12)

## 2022-12-13 ENCOUNTER — Encounter: Payer: Self-pay | Admitting: Physical Therapy

## 2022-12-13 ENCOUNTER — Ambulatory Visit: Payer: 59

## 2022-12-13 ENCOUNTER — Ambulatory Visit: Payer: 59 | Admitting: Physical Therapy

## 2022-12-13 DIAGNOSIS — I69351 Hemiplegia and hemiparesis following cerebral infarction affecting right dominant side: Secondary | ICD-10-CM | POA: Diagnosis not present

## 2022-12-13 DIAGNOSIS — R2689 Other abnormalities of gait and mobility: Secondary | ICD-10-CM

## 2022-12-13 DIAGNOSIS — M6281 Muscle weakness (generalized): Secondary | ICD-10-CM

## 2022-12-13 DIAGNOSIS — R482 Apraxia: Secondary | ICD-10-CM

## 2022-12-13 DIAGNOSIS — I639 Cerebral infarction, unspecified: Secondary | ICD-10-CM | POA: Diagnosis not present

## 2022-12-13 DIAGNOSIS — R4701 Aphasia: Secondary | ICD-10-CM

## 2022-12-13 DIAGNOSIS — R2681 Unsteadiness on feet: Secondary | ICD-10-CM

## 2022-12-13 DIAGNOSIS — R262 Difficulty in walking, not elsewhere classified: Secondary | ICD-10-CM

## 2022-12-13 LAB — CUP PACEART REMOTE DEVICE CHECK
Date Time Interrogation Session: 20240212081018
Implantable Pulse Generator Implant Date: 20230807
Pulse Gen Serial Number: 511016023

## 2022-12-13 NOTE — Therapy (Signed)
OUTPATIENT SPEECH LANGUAGE PATHOLOGY TREATMENT   Patient Name: Gordon Fisher MRN: [id number] DOB:Mar 13, 1961, 62 y.o., male Today's Date: 12/13/2022  PCP: TBD REFERRING PROVIDER: Jennye Boroughs, MD    End of Session - 12/13/22 1100     Visit Number 17    Number of Visits 31    Date for SLP Re-Evaluation 02/01/23    Authorization - Visit Number 3    Authorization - Number of Visits 20    SLP Start Time 0934    SLP Stop Time  1014    SLP Time Calculation (min) 40 min    Activity Tolerance Patient tolerated treatment well                       Past Medical History:  Diagnosis Date   Stroke [facility])    Past Surgical History:  Procedure Laterality Date   BUBBLE STUDY  02/15/2022   Procedure: BUBBLE STUDY;  Surgeon: Gordon Lean, MD;  Location: [place];  Service: Cardiovascular;;   IR CT HEAD LTD  02/11/2022   IR CT HEAD LTD  02/11/2022   IR CT HEAD LTD  02/11/2022   IR PERCUTANEOUS ART THROMBECTOMY/INFUSION INTRACRANIAL INC DIAG ANGIO  02/11/2022   IR PERCUTANEOUS ART THROMBECTOMY/INFUSION INTRACRANIAL INC DIAG ANGIO  02/11/2022   IR US GUIDE VASC ACCESS RIGHT  02/11/2022   IR US GUIDE VASC ACCESS RIGHT  02/11/2022   RADIOLOGY WITH ANESTHESIA N/A 02/11/2022   Procedure: IR WITH ANESTHESIA;  Surgeon: Radiologist, Medication, MD;  Location: [place];  Service: Radiology;  Laterality: N/A;   RADIOLOGY WITH ANESTHESIA N/A 02/11/2022   Procedure: RADIOLOGY WITH ANESTHESIA;  Surgeon: Radiologist, Medication, MD;  Location: [place];  Service: Radiology;  Laterality: N/A;   TEE WITHOUT CARDIOVERSION N/A 02/15/2022   Procedure: TRANSESOPHAGEAL ECHOCARDIOGRAM (TEE);  Surgeon: Gordon Lean, MD;  Location: [facility] ENDOSCOPY;  Service: Cardiovascular;  Laterality: N/A;   Patient Active Problem List   Diagnosis Date Noted   Hypocalcemia 03/22/2022   DVT, lower extremity, distal ([place]) 03/22/2022   Obesity 03/22/2022   Elevated LDL cholesterol level 03/22/2022    Acute ischemic left middle cerebral artery (MCA) stroke ([place]) 02/18/2022   Cerebrovascular accident (CVA) due to occlusion of left middle cerebral artery ([place]) 02/11/2022    ONSET DATE: 02-11-22   REFERRING DIAG: I63.9 (ICD-10-CM) - Cerebrovascular accident (CVA), unspecified mechanism ([place]) R13.10 (ICD-10-CM) - Dysphagia, unspecified type   THERAPY DIAG:  Verbal apraxia  Aphasia  Rationale for Evaluation and Treatment Rehabilitation  SUBJECTIVE:   SUBJECTIVE STATEMENT: "OKay."  Pt accompanied by: significant other  PERTINENT HISTORY: Gordon Fisher is a 62 year old male in relatively good health who was admitted via [facility] on 02/11/22 with right sided weakness, right facial droop and slurred speech. CTH showed L-MCA hyperdense sign and he received TNK prior to transfer. CTA showed L-MCA MI occlusion and he underwent cerebral angio with thrombectomy and TICI 3 flow by Dr. Nelida Fisher Gordon Fisher.  Follow up MRI brain done revealing small areas of acute infarct infarct in L-MCA territory with mild hemorrhagic transformation in left parietal lobe and L-MCA bifurcation residual vascular thrombus. He was taken back for thrombectomy with stent for dissection but had intra-stent occlusion which was unable to be recanalized. Post op CT showed small SAH which was stable on follow up. He was started on ASA/Brillinta. MBS 04/18 showed oral dysphagia with oral delay and no penetration or aspiration. He was started on D3, thins with supervision. Repeat MRI  brain on 04/16 showed extensive evolving acute ischemic L-MCA occlusion increased in size with associated edema, few additional small acute cortical subcortical infarcts involving contralateral right frontal lobe and left ACA distribution. Stroke felt to be due to unknown etiology and wife elected on Zio patch on discharge to rule out A fib. 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 Gordon Fisher was d/c'd from [facility] on 03-19-22, and has had HHST since that time until last week.   PAIN:  Are you having pain? No  PATIENT GOALS Pt indicated he would like talking to improve  OBJECTIVE:  DIAGNOSTIC FINDINGS: MRI without contrast 02/13/22 IMPRESSION: 1. Extensive evolving acute ischemic left MCA distribution infarct, increased in size as compared to previous MRI from 02/11/2022. Associated edema without significant midline shift. 2. Small volume acute subarachnoid hemorrhage at the posterior aspect of the left Sylvian fissure, stable. 3. Few additional small volume acute ischemic cortical and subcortical infarcts involving the contralateral right frontal lobe and left ACA distribution as above. 4. Underlying chronic microvascular ischemic disease.    PATIENT REPORTED OUTCOME MEASURES (PROM): Communication Effectiveness Survey: Pt scored 8/32 - higher scores indicate more effective communication/QOL.   TODAY'S TREATMENT:  12/13/22: Pt with new device and loaner device present today. Plan was for SLP to take loaner and return to [place] however Gordon Fisher did not back up the loaner with all the farm icons and information she did since last session. SLP attempted this process which took 5 minutes but was unsuccessful. Pt req'd mod A-max A consistently with two-word icon names as they were recorded so SLP trained/re-educated pt on how to make changes to timing of icon names by putting period and a space between the two words. Fading cues necessary ultimately to min A occasionally. SLP strongly stressed to pt and wife that pt needs to USE the device at home and provided possible opportunities for him to do so.  Gordon Fisher to contact [employer] and see how to get loaner backup onto pt's device , by next session. Three more sessions after today appear sufficient - pt/wife agree.  12/02/22: Pt entered room with device and indicated correctly the people who were at  his house yesterday. He was independent with accessing vegetables he will grown in the spring. SLP assisted (mod A occasionally) pt in programming icon for "seed" and "leaf lettuce". Pt req'd mod -max cues for adding icons. Max/total A for spelling icon names.  11/23/22: Pt has not been seen in over one month - verbal and receptive language skills have minimally improved. Pt has not spontaneously used device at home - however has used device when (rarely) prompted by wife. [name] has programmed device a little with farm items and garden items. SLP assisted pt and wife to relocate icons to more proper location/s. Pt req'd initial demo cues faded to occasional mod cues for editing icons' pictures and sound. With text, pt req'd faded cues to occasional min cues.  Continue all goals not met. Pt verbal expression improved dramatically when looking at picture and seeing icon name.   12/18/23[name] made some icons last week. Realized she had duplicated them when she saw them on another page and realized she had put some icons on the wrong page. Gordon Fisher is becoming more independent with deleting icons and is independent with moving icons up and down the page. He still requires occasional min A with adding and modifying icons but is improving  with this as his exposure with the process continues. Pt currently without internet so he cannot search for icons or back up device at home.   10/11/22: SLP began educating pt/wife how to create icons using camera, altered pronunciation text field for "Gordon Fisher", and searching online for photo. By session end pt was accessing orange three-bar icon in upper right hand corner independently when cued "what do you press to modify or add icons?", and moving icons independently. Pt and wife to make icons for family, and use for communicating food choices.  10-08-22: [employer] start trial date today. SLP introduced device to pt/wife. Pt with functional verbalization within 3 seconds of  initiation for "ham", "tomato", "cheese", "Gordon Fisher", and "sandwich". Pt produced "sandwich" spontaneously after 8 seconds and mutliple attempts, without label. SLP introduced how to move, add, and delete icons. Pt communicated his sandwich choice ("sandwich meat" originally, and then "ham" after it was added to page), and toppings choices (tomato, cheese) with consistent min-mod cues, faded to occasional min-mod cues. Pt and wife to use device over the weekend for food - - SLP to assist/teach pt and [name] how to add pictures to icons for family members next session.  09/27/22: [employer] not yet sent from [facility]. Should be here by pt's next appointment.  Gordon Fisher told SLP names of people at their Thanksgiving celebration, with usual mod to max assist. Visual cues were most effective on pt's most difficult words. SLP assessed pt's reading comprehension - 80% with 4-5 words.   11/8/23[name] stated they had been busy this week with appointments but still work at practicing some. SLP initiated a 20-minute discussion today with Evette Doffing, and SLP explaining speech generating device in depth, showing Gordon Fisher and [name] options for trial device and deciding on the [employer] Touch Talk. SLP to make referral for this in the next few days.  Family names were written with mod-max A usually - pt got first 3 letters correct with childrens and grandchildrens' names. SLP showed [name] how to break up daughter's name to [id number]-[id number] to encourage brain's processing in a different way in order to foster new neural pathways to say the name.  09/06/22: SLP targeted verbal expression with picture naming and divergent naming. Pt notably demonstrates he knows the word/s he wants to say but is finding it difficult to pronounce them (verbal apraxia). Pt's expression improves with visual cues (oral cues from SLP for initial phoneme/sound) - 75% successful even without sound necessary - just visual cue for articulator placement.   09/01/22: SLP  worked with pt's verbal expression and auditory comprehension today. Simple conversation/sentences were understood 90% of the time, 100% with one repeat by speaker. With opposites/paired words, pt had 70% success, improved to 90% with min A. With cloze phrases/sentences pt's response accuracy improved with 1 syllable words, but overall success was 17% with min A, improved to 50% with mod-max A. Often, pt knew the response but knew he was going to misarticulate the response. Written cues were helpful for pt here. SLP encouraged wife to cont with cloze phrases and paired words at home.  10/25/23[name] brought pt's cards today and SLP reviewed them with pt, modleing to wife cueing methods and hierarchy.Pt functionally said 6/7 salient objects (without written words) with mod-max cues, and 1/7 spontaneously. He said family names with written cues 6/7 (functionally), and 1/7 wth max cues Janett Billow"). Pt demo'd understnading of directions in context 75% today. SLP introduce possibility of [employer] to pt and wife and educated about studies  proving that AAC assists verbal communication and that pt may not have to have assistance with a AAC device but we will know more as time progresses.   08/23/22: SLP targeted verbal expression with pt today. Heavy perseveration with "[name]" grandson's name for all J names. SLP even did months of the year and pt cont to have perseveration with [name] for all J names. "[name]" - pt cont to make this into two syllables 90% of the time, with consistent max A. Pt used multi-modal communization using chicken clucks with SLP assistance and encouragement when pt could not say "chickens", and pointed to ages of his grandchildren with SLP writing numbers 1-10. Mod-max A needed for grandson 2 months old. Pt showed 1 and then gestured taking a short person's height for SLP to know young child. MAX A needed for month names > 1 syllable. With one syllable months pt 80% success. Pt told SLP name  of his truck, and functionally told SLP name of one of his two tractors. Req'd mod/max A otherwise. Pt to cont to practice salient/high frequency/high importance words.   08/18/22: SLP targeted spelling today with pt - 3 letter words 100%, 4-letter words 80% - [name] stated pt had s/sx dyslexia prior to CVA. SLP to monitor this. SLP provided some other hints/helpful suggestions for home practice and communication at home today. SLP noted pt read 3 and 4 letter words 50% accuracy when spelled correctly.   08/16/22: SLP assessed pt's reading comprehension - pt reading single words 6/6 and simple sentences of 6-9 words ("the boy is riding his bike in the park") 6/6. [name] said pt having difficulty reading newspaper from home. SLP did reading task (newspaper) with pt today. SLP highlighted key words for pt and he answered 4/6 questions correct by pt answering yes/no and pointing to correct portion in the article to indicate place where the answer was found. Pt will return CES next session or two.    PATIENT EDUCATION: Education details: see "Today's therapy" Person educated: Patient and Spouse Education method: Explanation and Demonstration Education comprehension: verbalized understanding and needs further education   GOALS: Goals reviewed with patient? Yes, in general  SHORT TERM GOALS: Target date: 08/31/2022 extended one week due to visits  Pt will name 6 salient family names when given picture and written name with consistent mod-max cues in 3 sessions (precursor for speech generating device) Baseline:08-25-22. 09-01-22 Goal status: partially met  2.  Pt will produce simple phoneme/ bilabial+/a/  /a/, /ba/, /ma/, and/or /pa/ simultaneously with SLP 65% success (equal # reps/stimulus) in 2 sessions Baseline:  Goal status: deferred - pt to focus on salient items/terms  3.  Pt will demo understanding of simple commands in context 80% over three sessions Baseline: 08-25-22, 09-01-22 Goal  status: Partially met  4.  Pt will indicate understanding of a complex sentence 85% of the time in 3 sessions (precursor for speech generating device) Baseline:  Goal status: not met  5.  Pt will copy written family names with 50% success in two sessions  Baseline:  Goal Status: met   LONG TERM GOALS: Target date: 10/24/2022 , 02/01/23  Pt will name 6 salient family names when given picture and written name with consistent mod cues in 3 sessions (precursor for speech generating device) Baseline:  Goal status: ongoing  2.  Pt will IMITATE simple phoneme/bilabial+/a/  /a/, /ba/, /ma/, and/or /pa/  65% success (equal # reps/stimulus) in 2 sessions Baseline:  Goal status: deferred - working with  pt on more salient words  3.  Pt will generate meaningful message understood by [name] using speech generating device 60% of the time in 3 sessions Baseline:  Goal status: ongoing  4.  [name] will indicate that spontaneous communication is easier than during week of evaluation Baseline:  Goal status: Ongoing   ASSESSMENT:  CLINICAL IMPRESSION: Gordon Fisher is a 62 y.o. male who cont to be seen today for treatment of verbal apraxia (as pt often indicates he knows the response but knows it will not be articulated correctly) and aphasia. [place] education again continued today. Pt will be receiving his device in this week and SLP to cont with training of pt and wife for basic programming and usage of device SEE TX NOTE from today for more details.   OBJECTIVE IMPAIRMENTS include expressive language, receptive language, and aphasia. These impairments are limiting patient from return to work, managing medications, managing appointments, household responsibilities, ADLs/IADLs, and effectively communicating at home and in community. Factors affecting potential to achieve goals and functional outcome are ability to learn/carryover information and severity of impairments. Patient will benefit from skilled SLP  services to address above impairments and improve overall function.  REHAB POTENTIAL: Good  PLAN: SLP FREQUENCY: 2x/week  SLP DURATION: 8 weeks  PLANNED INTERVENTIONS: Language facilitation, Environmental controls, Cueing hierachy, Internal/external aids, Functional tasks, and Multimodal communication approach    Samani Deal, CCC-SLP 12/13/2022, 11:00 AM

## 2022-12-13 NOTE — Therapy (Signed)
OUTPATIENT PHYSICAL THERAPY NEURO TREATMENT   Patient Name: Gordon Fisher MRN: [id number] DOB:07-15-1961, 62 y.o., male Today's Date: 12/13/2022   PCP: None listed REFERRING PROVIDER: Jennye Boroughs, MD       PT End of Session - 12/13/22 0858     Visit Number 22    Number of Visits 40    Date for PT Re-Evaluation 02/24/23    Authorization Type UHC [id number]    Authorization - Number of Visits 30    PT Start Time 0849    PT Stop Time 0929    PT Time Calculation (min) 40 min    Activity Tolerance Patient tolerated treatment well    Behavior During Therapy [facility] for tasks assessed/performed                       Past Medical History:  Diagnosis Date   Stroke [facility])    Past Surgical History:  Procedure Laterality Date   BUBBLE STUDY  02/15/2022   Procedure: BUBBLE STUDY;  Surgeon: Werner Lean, MD;  Location: [place];  Service: Cardiovascular;;   IR CT HEAD LTD  02/11/2022   IR CT HEAD LTD  02/11/2022   IR CT HEAD LTD  02/11/2022   IR PERCUTANEOUS ART THROMBECTOMY/INFUSION INTRACRANIAL INC DIAG ANGIO  02/11/2022   IR PERCUTANEOUS ART THROMBECTOMY/INFUSION INTRACRANIAL INC DIAG ANGIO  02/11/2022   IR US GUIDE VASC ACCESS RIGHT  02/11/2022   IR US GUIDE VASC ACCESS RIGHT  02/11/2022   RADIOLOGY WITH ANESTHESIA N/A 02/11/2022   Procedure: IR WITH ANESTHESIA;  Surgeon: Radiologist, Medication, MD;  Location: [place];  Service: Radiology;  Laterality: N/A;   RADIOLOGY WITH ANESTHESIA N/A 02/11/2022   Procedure: RADIOLOGY WITH ANESTHESIA;  Surgeon: Radiologist, Medication, MD;  Location: [place];  Service: Radiology;  Laterality: N/A;   TEE WITHOUT CARDIOVERSION N/A 02/15/2022   Procedure: TRANSESOPHAGEAL ECHOCARDIOGRAM (TEE);  Surgeon: Werner Lean, MD;  Location: [facility] ENDOSCOPY;  Service: Cardiovascular;  Laterality: N/A;   Patient Active Problem List   Diagnosis Date Noted   Hypocalcemia 03/22/2022   DVT, lower extremity, distal ([place]) 03/22/2022    Obesity 03/22/2022   Elevated LDL cholesterol level 03/22/2022   Acute ischemic left middle cerebral artery (MCA) stroke ([place]) 02/18/2022   Cerebrovascular accident (CVA) due to occlusion of left middle cerebral artery ([place]) 02/11/2022    ONSET DATE: April 2023  REFERRING DIAG: I63.9 (ICD-10-CM) - Cerebrovascular accident (CVA), unspecified mechanism ([place]) R13.10 (ICD-10-CM) - Dysphagia, unspecified type   THERAPY DIAG:  Hemiplegia and hemiparesis following cerebral infarction affecting right dominant side (HCC)  Unsteadiness on feet  Muscle weakness (generalized)  Other abnormalities of gait and mobility  Difficulty in walking, not elsewhere classified  Rationale for Evaluation and Treatment Rehabilitation  SUBJECTIVE:  SUBJECTIVE STATEMENT:  Nothing new going on in general, no falls or anything. Up for anything today.   Pt accompanied by: significant other  PERTINENT HISTORY: 02/11/2022 patient developed right-sided weakness, aphasia, went to hospital for evaluation.  Code stroke was activated.  He received TNK and then transferred to [facility].  He underwent cerebral angiogram with thrombectomy.  Unfortunately vessel reoccluded later the day and the second thrombectomy was attempted.  Left M2 dissection was identified and treated with stent but unfortunately stent demonstrated reocclusion   PAIN:  Are you having pain? No 0/10  PRECAUTIONS: Shoulder and Fall, Aphasia   WEIGHT BEARING RESTRICTIONS No  FALLS: Has patient fallen in last 6 months? Yes. Number of falls 1, required 3-assist to recover  LIVING ENVIRONMENT: Lives with: lives with their spouse Lives in: House/apartment Stairs: Yes: Internal: flight steps; bilateral but cannot reach both and External: 4-5 steps; ramp  is also present.  Has following equipment at home: Hemi walker, Wheelchair (manual), and sit-stand lift  PLOF: Independent  PATIENT GOALS regain independence  OBJECTIVE:     TREATMENT  12/13/22  TherEx  Nustep L6 x6 minutes LEs only intermittent minA to keep RLE in a good position Bridges x15 Supine R LE ADD to midline x15 ( LLE still) Bridges with L LE lift/RLE stabilizing, Mod A for sequencing and form x15, then reversed x15 (ModA for motor control/placement R LE) Gait training with SPC 2 laps around PT gym min guard/min cues   NMR  Forward step downs from blue foam pad MinA for balance no UEs x10  Lateral step downs from blue foam pad ModA for balance/foot clearing U UE progressing to U UE  Forward and backward walking in // bars no UEs, MinA for balance       PATIENT EDUCATION: Education details: edu on outcome measures Person educated: Patient and Spouse Education method: Explanation, Demonstration, Tactile cues, Verbal cues, and Handouts Education comprehension: verbalized understanding and returned demonstration     HOME EXERCISE PROGRAM Last updated: 09/15/22 Access Code: [email or web address] URL: [email or web address] Date: 09/15/2022 Prepared by: [facility]  Exercises - Forward Backward Weight Shift with Counter Support  - 1 x daily - 5 x weekly - 2 sets - 10 reps - Side to Side Weight Shift with Counter Support  - 1 x daily - 5 x weekly - 2 sets - 10 reps - Mini Squat with Counter Support  - 1 x daily - 5 x weekly - 2 sets - 10 reps - Stride Stance Weight Shift  - 1 x daily - 5 x weekly - 2 sets - 10 reps - Sit to stand in stride stance  - 2 x daily - 7 x weekly - 1-2 sets - 5 reps - Supine Bridge with Mini Swiss Ball Between Knees  - 1 x daily - 5 x weekly - 2 sets - 10 reps - Supine Heel Slide  - 1 x daily - 5 x weekly - 2 sets - 10 reps - Seated Isometric Hip Adduction with Ball  - 1 x daily - 5 x weekly -  2 sets - 10 reps - 3 sec hold    Below measures were taken at time of initial evaluation unless otherwise specified:     DIAGNOSTIC FINDINGS: Follow-up MRI brain done revealing small areas of acute infarct in left-MCA territory with mild hemorrhagic transformation in left parietal lobe and left-MCA bifurcation residual vascular thrombus   COGNITION: Overall  cognitive status:  Difficult to assess due to aphasia   SENSATION: Light touch: Impaired  Proprioception: Impaired   COORDINATION: RLE impaired, some deficits on left foot with alternating movements and isolated movements  EDEMA:  None   MUSCLE TONE: RLE: Modifed Ashworth Scale 2 = More marked increase in muscle tone through most of the ROM, but affected part(s) easily moved right quad most affected, no clonus right plantarflexors   MUSCLE LENGTH: Able to achieve full right knee extension to PROM  DTRs:  Patella 3+ = Brisk  POSTURE: No Significant postural limitations  LOWER EXTREMITY ROM:     LLE WNL, RLE AROM limited by weakness  LOWER EXTREMITY MMT:    MMT Right Eval Left Eval  Hip flexion 3- 5  Hip extension    Hip abduction 3- 4  Hip adduction 3- 5  Hip internal rotation    Hip external rotation    Knee flexion 3 5  Knee extension 2+ 5  Ankle dorsiflexion 0 5  Ankle plantarflexion 1 3+  Ankle inversion    Ankle eversion    (Blank rows = not tested)  BED MOBILITY:  NT  TRANSFERS: Assistive device utilized: Hemi walker  Sit to stand: Modified independence Stand to sit: Modified independence Chair to chair: Modified independence Floor:  Total assist --mod A for half kneeling  RAMP:  Level of Assistance: CGA Assistive device utilized: Hemi walker Ramp Comments:   CURB:  Level of Assistance: Min A Assistive device utilized: Nutritional therapist Comments: cues in sequence  STAIRS:  Level of Assistance: SBA  Stair Negotiation Technique: Step to Pattern with Single Rail on Left  Number of  Stairs: 15   Height of Stairs: 4-6"  Comments: cues and guarding  GAIT: Gait pattern: step to pattern, decreased ankle dorsiflexion- Right, and circumduction- Right Distance walked: 150 Assistive device utilized: Quad cane large base Level of assistance: Modified independence Comments: deficits during turning   FOTO 47%  TODAY'S TREATMENT:  assessment   PATIENT EDUCATION: Education details: assessment findings, PT scope of practice Person educated: Patient and Spouse Education method: Explanation Education comprehension: verbalized understanding     GOALS: Goals reviewed with patient? Yes  SHORT TERM GOALS: Target date: 01/13/2023      Patient will perform Advanced HEP with family/caregiver supervision for improved strength, balance, transfers, and gait  Baseline: indep with initial HEP Goal status: INITIAL  2.  Demo improved safety and ability to access home environment ambulating grass and uneven surfaces w/ SBA and least restrictive AD Baseline: CGA-min A grass/slopes/gravel Goal status: IN PROGRESS   4. Demonstrate improved gait speed to 1.8 ft/sec with right AFO and least restrictive AD to improve efficiency of gait  Baseline: 0.68 ft/sec at eval; (09/20/22) 1.59 ft/sec w/ platform RW and AFO; (12/02/22) 1.4 ft/sec w/ WBQC and AFO  Goal status: IN PROGRESS  5. Improve mobility and reduce level of assist performing floor to stand transfers w/ SBA  Baseline: max A  Goal status: IN PROGRESS  LONG TERM GOALS: Target date: 02/24/2023    Demonstrate improved functional status and quality of life meeting FOTO predicted outcomes (59% predicted) Baseline: 47% Goal status:IN PROGRESS  2.  Demonstrate modified independent ambulation x 300 ft uneven surfaces using least restrictive AD in order to improve safety with home environment Baseline: CGA-min A w/ WBQC Goal status: IN PROGRESS  3.  Manifest improved safety with gait per time of 18 sec TUG test using least  restrictive AD Baseline: 34.75 sec  w/ HW; (09/20/22) 30.69 w/ HW and right AFO; (12/02/22) 23 sec w/ WBQC Goal status: IN PROGRESS   4. Stair ambulation x 15 steps with set-up assist to improve safety in home environment and reduce burden of care  Baseline: SBA with single HR, using AFO; (12/02/22) supervision  Goal status: On-going      ASSESSMENT:  CLINICAL IMPRESSION:  [name] arrives today doing OK, we started on the Nustep for resisted reciprocal motion training/resistance training for RLE, otherwise worked on hip strength and balance today. Doing well and remains very motivated- will continue to progress as able!    OBJECTIVE IMPAIRMENTS Abnormal gait, decreased activity tolerance, decreased balance, decreased coordination, decreased knowledge of use of DME, decreased mobility, difficulty walking, decreased strength, impaired sensation, impaired tone, impaired UE functional use, and improper body mechanics.   ACTIVITY LIMITATIONS carrying, lifting, bending, standing, squatting, stairs, transfers, bathing, toileting, dressing, reach over head, and locomotion level  PARTICIPATION LIMITATIONS: meal prep, cleaning, laundry, interpersonal relationship, driving, shopping, community activity, occupation, yard work, and leisure activities  [place] Time since onset of injury/illness/exacerbation are also affecting patient's functional outcome.   REHAB POTENTIAL: Excellent  CLINICAL DECISION MAKING: Evolving/moderate complexity  EVALUATION COMPLEXITY: Moderate  PLAN: PT FREQUENCY: 1-2/wk  PT DURATION: 12 weeks  PLANNED INTERVENTIONS: Therapeutic exercises, Therapeutic activity, Neuromuscular re-education, Balance training, Gait training, Patient/Family education, Self Care, Joint mobilization, Stair training, Vestibular training, Canalith repositioning, Orthotic/Fit training, DME instructions, Aquatic Therapy, Dry Needling, Electrical stimulation, Spinal mobilization, Cryotherapy,  Moist heat, Splintting, Taping, and Manual therapy  PLAN FOR NEXT SESSION: outdoor quad cane gait training including ramps, curbs, trial SPC? hip ABD strengthening , half kneeling with folded gym mat to progress floor to stand   Deniece Ree PT DPT PN2

## 2022-12-15 LAB — JAK2 GENOTYPR

## 2022-12-20 ENCOUNTER — Ambulatory Visit (HOSPITAL_COMMUNITY)
Admission: RE | Admit: 2022-12-20 | Discharge: 2022-12-20 | Disposition: A | Discharge status: Home / Self Care | Payer: 59 | Source: Ambulatory Visit | Attending: Hematology | Admitting: Hematology

## 2022-12-20 DIAGNOSIS — I63512 Cerebral infarction due to unspecified occlusion or stenosis of left middle cerebral artery: Secondary | ICD-10-CM

## 2022-12-20 DIAGNOSIS — I824Z1 Acute embolism and thrombosis of unspecified deep veins of right distal lower extremity: Secondary | ICD-10-CM | POA: Insufficient documentation

## 2022-12-21 LAB — FACTOR 5 LEIDEN

## 2022-12-21 NOTE — Therapy (Incomplete)
OUTPATIENT PHYSICAL THERAPY NEURO TREATMENT   Patient Name: Gordon Fisher MRN: [id number] DOB:[birth year], 61 y.o., male Today's Date: 12/13/2022   PCP: None listed REFERRING PROVIDER: Jennye Boroughs, MD       PT End of Session - 12/13/22 0858     Visit Number 22    Number of Visits 40    Date for PT Re-Evaluation 02/24/23    Authorization Type UHC 2023    Authorization - Number of Visits 30    PT Start Time 0849    PT Stop Time 0929    PT Time Calculation (min) 40 min    Activity Tolerance Patient tolerated treatment well    Behavior During Therapy [facility] for tasks assessed/performed                       Past Medical History:  Diagnosis Date   Stroke [facility])    Past Surgical History:  Procedure Laterality Date   BUBBLE STUDY  02/15/2022   Procedure: BUBBLE STUDY;  Surgeon: Werner Lean, MD;  Location: [place];  Service: Cardiovascular;;   IR CT HEAD LTD  02/11/2022   IR CT HEAD LTD  02/11/2022   IR CT HEAD LTD  02/11/2022   IR PERCUTANEOUS ART THROMBECTOMY/INFUSION INTRACRANIAL INC DIAG ANGIO  02/11/2022   IR PERCUTANEOUS ART THROMBECTOMY/INFUSION INTRACRANIAL INC DIAG ANGIO  02/11/2022   IR US GUIDE VASC ACCESS RIGHT  02/11/2022   IR US GUIDE VASC ACCESS RIGHT  02/11/2022   RADIOLOGY WITH ANESTHESIA N/A 02/11/2022   Procedure: IR WITH ANESTHESIA;  Surgeon: Radiologist, Medication, MD;  Location: [place];  Service: Radiology;  Laterality: N/A;   RADIOLOGY WITH ANESTHESIA N/A 02/11/2022   Procedure: RADIOLOGY WITH ANESTHESIA;  Surgeon: Radiologist, Medication, MD;  Location: [place];  Service: Radiology;  Laterality: N/A;   TEE WITHOUT CARDIOVERSION N/A 02/15/2022   Procedure: TRANSESOPHAGEAL ECHOCARDIOGRAM (TEE);  Surgeon: Werner Lean, MD;  Location: [facility] ENDOSCOPY;  Service: Cardiovascular;  Laterality: N/A;   Patient Active Problem List   Diagnosis Date Noted   Hypocalcemia 03/22/2022   DVT, lower extremity, distal ([place]) 03/22/2022    Obesity 03/22/2022   Elevated LDL cholesterol level 03/22/2022   Acute ischemic left middle cerebral artery (MCA) stroke ([place]) 02/18/2022   Cerebrovascular accident (CVA) due to occlusion of left middle cerebral artery ([place]) 02/11/2022    ONSET DATE: April 2023  REFERRING DIAG: I63.9 (ICD-10-CM) - Cerebrovascular accident (CVA), unspecified mechanism ([place]) R13.10 (ICD-10-CM) - Dysphagia, unspecified type   THERAPY DIAG:  Hemiplegia and hemiparesis following cerebral infarction affecting right dominant side (HCC)  Unsteadiness on feet  Muscle weakness (generalized)  Other abnormalities of gait and mobility  Difficulty in walking, not elsewhere classified  Rationale for Evaluation and Treatment Rehabilitation  SUBJECTIVE:  SUBJECTIVE STATEMENT:  Nothing new going on in general, no falls or anything. Up for anything today.   Pt accompanied by: significant other  PERTINENT HISTORY: 02/11/2022 patient developed right-sided weakness, aphasia, went to hospital for evaluation.  Code stroke was activated.  He received TNK and then transferred to [facility].  He underwent cerebral angiogram with thrombectomy.  Unfortunately vessel reoccluded later the day and the second thrombectomy was attempted.  Left M2 dissection was identified and treated with stent but unfortunately stent demonstrated reocclusion   PAIN:  Are you having pain? No 0/10  PRECAUTIONS: Shoulder and Fall, Aphasia   WEIGHT BEARING RESTRICTIONS No  FALLS: Has patient fallen in last 6 months? Yes. Number of falls 1, required 3-assist to recover  LIVING ENVIRONMENT: Lives with: lives with their spouse Lives in: House/apartment Stairs: Yes: Internal: flight steps; bilateral but cannot reach both and External: 4-5 steps; ramp  is also present.  Has following equipment at home: Hemi walker, Wheelchair (manual), and sit-stand lift  PLOF: Independent  PATIENT GOALS regain independence  OBJECTIVE:     TODAY'S TREATMENT: 12/22/22 Activity Comments                       HOME EXERCISE PROGRAM Last updated: 09/15/22 Access Code: [email or web address] URL: [email or web address] Date: 09/15/2022 Prepared by: [facility]  Exercises - Forward Backward Weight Shift with Counter Support  - 1 x daily - 5 x weekly - 2 sets - 10 reps - Side to Side Weight Shift with Counter Support  - 1 x daily - 5 x weekly - 2 sets - 10 reps - Mini Squat with Counter Support  - 1 x daily - 5 x weekly - 2 sets - 10 reps - Stride Stance Weight Shift  - 1 x daily - 5 x weekly - 2 sets - 10 reps - Sit to stand in stride stance  - 2 x daily - 7 x weekly - 1-2 sets - 5 reps - Supine Bridge with Mini Swiss Ball Between Knees  - 1 x daily - 5 x weekly - 2 sets - 10 reps - Supine Heel Slide  - 1 x daily - 5 x weekly - 2 sets - 10 reps - Seated Isometric Hip Adduction with Ball  - 1 x daily - 5 x weekly - 2 sets - 10 reps - 3 sec hold    Below measures were taken at time of initial evaluation unless otherwise specified:     DIAGNOSTIC FINDINGS: Follow-up MRI brain done revealing small areas of acute infarct in left-MCA territory with mild hemorrhagic transformation in left parietal lobe and left-MCA bifurcation residual vascular thrombus   COGNITION: Overall cognitive status:  Difficult to assess due to aphasia   SENSATION: Light touch: Impaired  Proprioception: Impaired   COORDINATION: RLE impaired, some deficits on left foot with alternating movements and isolated movements  EDEMA:  None   MUSCLE TONE: RLE: Modifed Ashworth Scale 2 = More marked increase in muscle tone through most of the ROM, but affected part(s) easily moved right quad most affected, no clonus right  plantarflexors   MUSCLE LENGTH: Able to achieve full right knee extension to PROM  DTRs:  Patella 3+ = Brisk  POSTURE: No Significant postural limitations  LOWER EXTREMITY ROM:     LLE WNL, RLE AROM limited by weakness  LOWER EXTREMITY MMT:    MMT Right Eval Left Eval  Hip flexion  3- 5  Hip extension    Hip abduction 3- 4  Hip adduction 3- 5  Hip internal rotation    Hip external rotation    Knee flexion 3 5  Knee extension 2+ 5  Ankle dorsiflexion 0 5  Ankle plantarflexion 1 3+  Ankle inversion    Ankle eversion    (Blank rows = not tested)  BED MOBILITY:  NT  TRANSFERS: Assistive device utilized: Hemi walker  Sit to stand: Modified independence Stand to sit: Modified independence Chair to chair: Modified independence Floor:  Total assist --mod A for half kneeling  RAMP:  Level of Assistance: CGA Assistive device utilized: Hemi walker Ramp Comments:   CURB:  Level of Assistance: Min A Assistive device utilized: Nutritional therapist Comments: cues in sequence  STAIRS:  Level of Assistance: SBA  Stair Negotiation Technique: Step to Pattern with Single Rail on Left  Number of Stairs: 15   Height of Stairs: 4-6"  Comments: cues and guarding  GAIT: Gait pattern: step to pattern, decreased ankle dorsiflexion- Right, and circumduction- Right Distance walked: 150 Assistive device utilized: Quad cane large base Level of assistance: Modified independence Comments: deficits during turning   FOTO 47%  TODAY'S TREATMENT:  assessment   PATIENT EDUCATION: Education details: assessment findings, PT scope of practice Person educated: Patient and Spouse Education method: Explanation Education comprehension: verbalized understanding     GOALS: Goals reviewed with patient? Yes  SHORT TERM GOALS: Target date: 01/13/2023      Patient will perform Advanced HEP with family/caregiver supervision for improved strength, balance, transfers, and gait   Baseline: indep with initial HEP Goal status: INITIAL  2.  Demo improved safety and ability to access home environment ambulating grass and uneven surfaces w/ SBA and least restrictive AD Baseline: CGA-min A grass/slopes/gravel Goal status: IN PROGRESS   4. Demonstrate improved gait speed to 1.8 ft/sec with right AFO and least restrictive AD to improve efficiency of gait  Baseline: 0.68 ft/sec at eval; (09/20/22) 1.59 ft/sec w/ platform RW and AFO; (12/02/22) 1.4 ft/sec w/ WBQC and AFO  Goal status: IN PROGRESS  5. Improve mobility and reduce level of assist performing floor to stand transfers w/ SBA  Baseline: max A  Goal status: IN PROGRESS  LONG TERM GOALS: Target date: 02/24/2023    Demonstrate improved functional status and quality of life meeting FOTO predicted outcomes (59% predicted) Baseline: 47% Goal status:IN PROGRESS  2.  Demonstrate modified independent ambulation x 300 ft uneven surfaces using least restrictive AD in order to improve safety with home environment Baseline: CGA-min A w/ WBQC Goal status: IN PROGRESS  3.  Manifest improved safety with gait per time of 18 sec TUG test using least restrictive AD Baseline: 34.75 sec w/ HW; (09/20/22) 30.69 w/ HW and right AFO; (12/02/22) 23 sec w/ WBQC Goal status: IN PROGRESS   4. Stair ambulation x 15 steps with set-up assist to improve safety in home environment and reduce burden of care  Baseline: SBA with single HR, using AFO; (12/02/22) supervision  Goal status: On-going      ASSESSMENT:  CLINICAL IMPRESSION:  [name] arrives today doing OK, we started on the Nustep for resisted reciprocal motion training/resistance training for RLE, otherwise worked on hip strength and balance today. Doing well and remains very motivated- will continue to progress as able!    OBJECTIVE IMPAIRMENTS Abnormal gait, decreased activity tolerance, decreased balance, decreased coordination, decreased knowledge of use of DME, decreased  mobility, difficulty walking, decreased strength,  impaired sensation, impaired tone, impaired UE functional use, and improper body mechanics.   ACTIVITY LIMITATIONS carrying, lifting, bending, standing, squatting, stairs, transfers, bathing, toileting, dressing, reach over head, and locomotion level  PARTICIPATION LIMITATIONS: meal prep, cleaning, laundry, interpersonal relationship, driving, shopping, community activity, occupation, yard work, and leisure activities  [place] Time since onset of injury/illness/exacerbation are also affecting patient's functional outcome.   REHAB POTENTIAL: Excellent  CLINICAL DECISION MAKING: Evolving/moderate complexity  EVALUATION COMPLEXITY: Moderate  PLAN: PT FREQUENCY: 1-2/wk  PT DURATION: 12 weeks  PLANNED INTERVENTIONS: Therapeutic exercises, Therapeutic activity, Neuromuscular re-education, Balance training, Gait training, Patient/Family education, Self Care, Joint mobilization, Stair training, Vestibular training, Canalith repositioning, Orthotic/Fit training, DME instructions, Aquatic Therapy, Dry Needling, Electrical stimulation, Spinal mobilization, Cryotherapy, Moist heat, Splintting, Taping, and Manual therapy  PLAN FOR NEXT SESSION: outdoor quad cane gait training including ramps, curbs, trial SPC? hip ABD strengthening , half kneeling with folded gym mat to progress floor to stand

## 2022-12-22 ENCOUNTER — Ambulatory Visit: Payer: 59 | Admitting: Occupational Therapy

## 2022-12-22 ENCOUNTER — Ambulatory Visit: Payer: 59 | Admitting: Physical Therapy

## 2022-12-22 ENCOUNTER — Ambulatory Visit: Payer: 59

## 2022-12-22 LAB — PROTHROMBIN GENE MUTATION

## 2022-12-28 ENCOUNTER — Ambulatory Visit: Payer: 59

## 2022-12-28 ENCOUNTER — Ambulatory Visit: Payer: 59 | Admitting: Physical Therapy

## 2022-12-28 ENCOUNTER — Ambulatory Visit: Payer: 59 | Admitting: Occupational Therapy

## 2022-12-28 ENCOUNTER — Encounter: Payer: Self-pay | Admitting: Physical Therapy

## 2022-12-28 DIAGNOSIS — M6281 Muscle weakness (generalized): Secondary | ICD-10-CM

## 2022-12-28 DIAGNOSIS — R278 Other lack of coordination: Secondary | ICD-10-CM

## 2022-12-28 DIAGNOSIS — I69351 Hemiplegia and hemiparesis following cerebral infarction affecting right dominant side: Secondary | ICD-10-CM

## 2022-12-28 DIAGNOSIS — R2681 Unsteadiness on feet: Secondary | ICD-10-CM

## 2022-12-28 DIAGNOSIS — R4701 Aphasia: Secondary | ICD-10-CM

## 2022-12-28 DIAGNOSIS — R2689 Other abnormalities of gait and mobility: Secondary | ICD-10-CM

## 2022-12-28 DIAGNOSIS — R208 Other disturbances of skin sensation: Secondary | ICD-10-CM

## 2022-12-28 DIAGNOSIS — R482 Apraxia: Secondary | ICD-10-CM

## 2022-12-28 NOTE — Therapy (Signed)
OUTPATIENT SPEECH LANGUAGE PATHOLOGY TREATMENT   Patient Name: Gordon Fisher MRN: [id number] DOB:1961/08/07, 62 y.o., male Today's Date: 12/28/2022  PCP: TBD REFERRING PROVIDER: Jennye Boroughs, MD    End of Session - 12/28/22 1554     Visit Number 18    Number of Visits 31    Date for SLP Re-Evaluation 02/01/23    Authorization - Visit Number 4    Authorization - Number of Visits 20    Progress Note Due on Visit --    SLP Start Time 1448    SLP Stop Time  1530    SLP Time Calculation (min) 42 min    Activity Tolerance Patient tolerated treatment well                       Past Medical History:  Diagnosis Date   Stroke [facility])    Past Surgical History:  Procedure Laterality Date   BUBBLE STUDY  02/15/2022   Procedure: BUBBLE STUDY;  Surgeon: Gordon Lean, MD;  Location: [place];  Service: Cardiovascular;;   IR CT HEAD LTD  02/11/2022   IR CT HEAD LTD  02/11/2022   IR CT HEAD LTD  02/11/2022   IR PERCUTANEOUS ART THROMBECTOMY/INFUSION INTRACRANIAL INC DIAG ANGIO  02/11/2022   IR PERCUTANEOUS ART THROMBECTOMY/INFUSION INTRACRANIAL INC DIAG ANGIO  02/11/2022   IR US GUIDE VASC ACCESS RIGHT  02/11/2022   IR US GUIDE VASC ACCESS RIGHT  02/11/2022   RADIOLOGY WITH ANESTHESIA N/A 02/11/2022   Procedure: IR WITH ANESTHESIA;  Surgeon: Radiologist, Medication, MD;  Location: [place];  Service: Radiology;  Laterality: N/A;   RADIOLOGY WITH ANESTHESIA N/A 02/11/2022   Procedure: RADIOLOGY WITH ANESTHESIA;  Surgeon: Radiologist, Medication, MD;  Location: [place];  Service: Radiology;  Laterality: N/A;   TEE WITHOUT CARDIOVERSION N/A 02/15/2022   Procedure: TRANSESOPHAGEAL ECHOCARDIOGRAM (TEE);  Surgeon: Gordon Lean, MD;  Location: [facility] ENDOSCOPY;  Service: Cardiovascular;  Laterality: N/A;   Patient Active Problem List   Diagnosis Date Noted   Hypocalcemia 03/22/2022   DVT, lower extremity, distal ([place]) 03/22/2022   Obesity 03/22/2022   Elevated LDL  cholesterol level 03/22/2022   Acute ischemic left middle cerebral artery (MCA) stroke ([place]) 02/18/2022   Cerebrovascular accident (CVA) due to occlusion of left middle cerebral artery ([place]) 02/11/2022    ONSET DATE: 02-11-22   REFERRING DIAG: I63.9 (ICD-10-CM) - Cerebrovascular accident (CVA), unspecified mechanism ([place]) R13.10 (ICD-10-CM) - Dysphagia, unspecified type   THERAPY DIAG:  Verbal apraxia  Aphasia  Rationale for Evaluation and Treatment Rehabilitation  SUBJECTIVE:   SUBJECTIVE STATEMENT: "Gordon Fisher"  (pt historically has not been able to say all three syllables in his dtr's name) Pt accompanied by: significant other  PERTINENT HISTORY: Gordon Fisher is a 62 year old male in relatively good health who was admitted via [facility] on 02/11/22 with right sided weakness, right facial droop and slurred speech. CTH showed L-MCA hyperdense sign and he received TNK prior to transfer. CTA showed L-MCA MI occlusion and he underwent cerebral angio with thrombectomy and TICI 3 flow by Gordon Fisher.  Follow up MRI brain done revealing small areas of acute infarct infarct in L-MCA territory with mild hemorrhagic transformation in left parietal lobe and L-MCA bifurcation residual vascular thrombus. He was taken back for thrombectomy with stent for dissection but had intra-stent occlusion which was unable to be recanalized. Post op CT showed small SAH which was stable on follow up. He was started on  ASA/Brillinta. MBS 04/18 showed oral dysphagia with oral delay and no penetration or aspiration. He was started on D3, thins with supervision. Repeat MRI brain on 04/16 showed extensive evolving acute ischemic L-MCA occlusion increased in size with associated edema, few additional small acute cortical subcortical infarcts involving contralateral right frontal lobe and left ACA distribution. Stroke felt to be due to unknown etiology and wife elected on Gordon Fisher on discharge to rule out A fib.  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 Gordon Fisher was d/c'd from [facility] on 03-19-22, and has had HHST since that time until last week.   PAIN:  Are you having pain? No  PATIENT GOALS Pt indicated he would like talking to improve  OBJECTIVE:  DIAGNOSTIC FINDINGS: MRI without contrast 02/13/22 IMPRESSION: 1. Extensive evolving acute ischemic left MCA distribution infarct, increased in size as compared to previous MRI from 02/11/2022. Associated edema without significant midline shift. 2. Small volume acute subarachnoid hemorrhage at the posterior aspect of the left Sylvian fissure, stable. 3. Few additional small volume acute ischemic cortical and subcortical infarcts involving the contralateral right frontal lobe and left ACA distribution as above. 4. Underlying chronic microvascular ischemic disease.    PATIENT REPORTED OUTCOME MEASURES (PROM): Communication Effectiveness Survey: Pt scored 8/32 - higher scores indicate more effective communication/QOL.   TODAY'S TREATMENT:  12/28/22: Pt with new device. Gordon Fisher now backing up regularly, after new icons/voices created. Today pt used Ling[employer] independently for questions re: family and with occasional max A primarily for language comprehension for SLP question stimuli and then rare min A for pt's navigating to correct response. Once on the page pt activated correct icon 90%. Homework to have Gordon Fisher assist pt in generating 70% of new icons, she will do 30% herself for saving time. SLP reiterated eventually we want pt to program himslef but he needs practice to do that.   12/13/22: Pt with new device and loaner device present today. Plan was for SLP to take loaner and return to [place] however Gordon Fisher did not back up the loaner with all the farm icons and information she did since last session. SLP attempted this process which took 5 minutes but  was unsuccessful. Pt req'd mod A-max A consistently with two-word icon names as they were recorded so SLP trained/re-educated pt on how to make changes to timing of icon names by putting period and a space between the two words. Fading cues necessary ultimately to min A occasionally. SLP strongly stressed to pt and wife that pt needs to USE the device at home and provided possible opportunities for him to do so.  Gordon Fisher to contact [employer] and see how to get loaner backup onto pt's device , by next session. Three more sessions after today appear sufficient - pt/wife agree.  12/02/22: Pt entered room with device and indicated correctly the people who were at his house yesterday. He was independent with accessing vegetables he will grown in the spring. SLP assisted (mod A occasionally) pt in programming icon for "seed" and "leaf lettuce". Pt req'd mod -max cues for adding icons. Max/total A for spelling icon names.  11/23/22: Pt has not been seen in over one month - verbal and receptive language skills have minimally improved. Pt has not spontaneously used device at home - however has used device when (rarely) prompted by wife. Gordon Fisher has programmed device a little with farm items and garden items. SLP assisted pt and  wife to relocate icons to more proper location/s. Pt req'd initial demo cues faded to occasional mod cues for editing icons' pictures and sound. With text, pt req'd faded cues to occasional min cues.  Continue all goals not met. Pt verbal expression improved dramatically when looking at picture and seeing icon name.   12/18/23Laretta Fisher made some icons last week. Realized she had duplicated them when she saw them on another page and realized she had put some icons on the wrong page. Gordon Fisher is becoming more independent with deleting icons and is independent with moving icons up and down the page. He still requires occasional min A with adding and modifying icons but is improving with this as his  exposure with the process continues. Pt currently without internet so he cannot search for icons or back up device at home.   10/11/22: SLP began educating pt/wife how to create icons using camera, altered pronunciation text field for "Gordon Fisher", and searching online for photo. By session end pt was accessing orange three-bar icon in upper right hand corner independently when cued "what do you press to modify or add icons?", and moving icons independently. Pt and wife to make icons for family, and use for communicating food choices.  10-08-22: Lingraphica start trial date today. SLP introduced device to pt/wife. Pt with functional verbalization within 3 seconds of initiation for "ham", "tomato", "cheese", "Gordon Fisher", and "sandwich". Pt produced "sandwich" spontaneously after 8 seconds and mutliple attempts, without label. SLP introduced how to move, add, and delete icons. Pt communicated his sandwich choice ("sandwich meat" originally, and then "ham" after it was added to page), and toppings choices (tomato, cheese) with consistent min-mod cues, faded to occasional min-mod cues. Pt and wife to use device over the weekend for food - - SLP to assist/teach pt and Gordon Fisher how to add pictures to icons for family members next session.  09/27/22: Lingraphica not yet sent from [facility]. Should be here by pt's next appointment.  Gordon Fisher told SLP names of people at their Thanksgiving celebration, with usual mod to max assist. Visual cues were most effective on pt's most difficult words. SLP assessed pt's reading comprehension - 80% with 4-5 words.   11/8/23Laretta Fisher stated they had been busy this week with appointments but still work at practicing some. SLP initiated a 20-minute discussion today with Evette Doffing, and SLP explaining speech generating device in depth, showing Gordon Fisher and Gordon Fisher options for trial device and deciding on the Lingraphica Touch Talk. SLP to make referral for this in the next few days.  Family names were written  with mod-max A usually - pt got first 3 letters correct with childrens and grandchildrens' names. SLP showed Gordon Fisher how to break up daughter's name to [id number]-[id number] to encourage brain's processing in a different way in order to foster new neural pathways to say the name.  09/06/22: SLP targeted verbal expression with picture naming and divergent naming. Pt notably demonstrates he knows the word/s he wants to say but is finding it difficult to pronounce them (verbal apraxia). Pt's expression improves with visual cues (oral cues from SLP for initial phoneme/sound) - 75% successful even without sound necessary - just visual cue for articulator placement.   09/01/22: SLP worked with pt's verbal expression and auditory comprehension today. Simple conversation/sentences were understood 90% of the time, 100% with one repeat by speaker. With opposites/paired words, pt had 70% success, improved to 90% with min A. With cloze phrases/sentences pt's response accuracy improved with 1 syllable words, but  overall success was 17% with min A, improved to 50% with mod-max A. Often, pt knew the response but knew he was going to misarticulate the response. Written cues were helpful for pt here. SLP encouraged wife to cont with cloze phrases and paired words at home.  10/25/23Laretta Fisher brought pt's cards today and SLP reviewed them with pt, modleing to wife cueing methods and hierarchy.Pt functionally said 6/7 salient objects (without written words) with mod-max cues, and 1/7 spontaneously. He said family names with written cues 6/7 (functionally), and 1/7 wth max cues Janett Billow"). Pt demo'd understnading of directions in context 75% today. SLP introduce possibility of Lingraphica to pt and wife and educated about studies proving that AAC assists verbal communication and that pt may not have to have assistance with a AAC device but we will know more as time progresses.   08/23/22: SLP targeted verbal expression with pt today. Heavy  perseveration with "[name]" grandson's name for all J names. SLP even did months of the year and pt cont to have perseveration with [name] for all J names. "[name]" - pt cont to make this into two syllables 90% of the time, with consistent max A. Pt used multi-modal communization using chicken clucks with SLP assistance and encouragement when pt could not say "chickens", and pointed to ages of his grandchildren with SLP writing numbers 1-10. Mod-max A needed for grandson 2 months old. Pt showed 1 and then gestured taking a short person's height for SLP to know young child. MAX A needed for month names > 1 syllable. With one syllable months pt 80% success. Pt told SLP name of his truck, and functionally told SLP name of one of his two tractors. Req'd mod/max A otherwise. Pt to cont to practice salient/high frequency/high importance words.   08/18/22: SLP targeted spelling today with pt - 3 letter words 100%, 4-letter words 80% - [name] stated pt had s/sx dyslexia prior to CVA. SLP to monitor this. SLP provided some other hints/helpful suggestions for home practice and communication at home today. SLP noted pt read 3 and 4 letter words 50% accuracy when spelled correctly.   08/16/22: SLP assessed pt's reading comprehension - pt reading single words 6/6 and simple sentences of 6-9 words ("the boy is riding his bike in the park") 6/6. [name] said pt having difficulty reading newspaper from home. SLP did reading task (newspaper) with pt today. SLP highlighted key words for pt and he answered 4/6 questions correct by pt answering yes/no and pointing to correct portion in the article to indicate place where the answer was found. Pt will return CES next session or two.    PATIENT EDUCATION: Education details: see "Today's therapy" Person educated: Patient and Spouse Education method: Explanation and Demonstration Education comprehension: verbalized understanding and needs further education   GOALS: Goals reviewed  with patient? Yes, in general  SHORT TERM GOALS: Target date: 08/31/2022 extended one week due to visits  Pt will name 6 salient family names when given picture and written name with consistent mod-max cues in 3 sessions (precursor for speech generating device) Baseline:08-25-22. 09-01-22 Goal status: partially met  2.  Pt will produce simple phoneme/ bilabial+/a/  /a/, /ba/, /ma/, and/or /pa/ simultaneously with SLP 65% success (equal # reps/stimulus) in 2 sessions Baseline:  Goal status: deferred - pt to focus on salient items/terms  3.  Pt will demo understanding of simple commands in context 80% over three sessions Baseline: 08-25-22, 09-01-22 Goal status: Partially met  4.  Pt  will indicate understanding of a complex sentence 85% of the time in 3 sessions (precursor for speech generating device) Baseline:  Goal status: not met  5.  Pt will copy written family names with 50% success in two sessions  Baseline:  Goal Status: met   LONG TERM GOALS: Target date: 10/24/2022 , 02/01/23  Pt will name 6 salient family names when given picture and written name with consistent mod cues in 3 sessions (precursor for speech generating device) Baseline: 12/28/22 Goal status: ongoing  2.  Pt will IMITATE simple phoneme/bilabial+/a/  /a/, /ba/, /ma/, and/or /pa/  65% success (equal # reps/stimulus) in 2 sessions Baseline:  Goal status: deferred - working with pt on more salient words  3.  Pt will generate meaningful message understood by [name] using speech generating device 60% of the time in 3 sessions Baseline:  Goal status: ongoing  4.  [name] will indicate that spontaneous communication is easier than during week of evaluation Baseline:  Goal status: Ongoing   ASSESSMENT:  CLINICAL IMPRESSION: Gordon Fisher is a 62 y.o. male who cont to be seen today for treatment of verbal apraxia (as pt often indicates he knows the response but knows it will not be articulated correctly) and aphasia.  Treatment involves use of pt's Lingraph[employer] speech generating device (SGD). SEE TX NOTE from today for more details.   OBJECTIVE IMPAIRMENTS include expressive language, receptive language, and aphasia. These impairments are limiting patient from return to work, managing medications, managing appointments, household responsibilities, ADLs/IADLs, and effectively communicating at home and in community. Factors affecting potential to achieve goals and functional outcome are ability to learn/carryover information and severity of impairments. Patient will benefit from skilled SLP services to address above impairments and improve overall function.  REHAB POTENTIAL: Good  PLAN: SLP FREQUENCY: 2x/week  SLP DURATION: 8 weeks  PLANNED INTERVENTIONS: Language facilitation, Environmental controls, Cueing hierachy, Internal/external aids, Functional tasks, and Multimodal communication approach    Tala Eber, CCC-SLP 12/28/2022, 3:56 PM

## 2022-12-28 NOTE — Therapy (Signed)
OUTPATIENT OCCUPATIONAL THERAPY NEURO TREATMENT NOTE  Patient Name: Gordon Fisher MRN: [id number] DOB:1960/12/09, 62 y.o., male Today's Date: 12/28/2022  PCP: none on file REFERRING PROVIDER: Jennye Boroughs, MD    OT End of Session - 12/28/22 1405     Visit Number 21    Number of Visits 30    Date for OT Re-Evaluation 01/21/23    Authorization Type [employer]    Authorization Time Period VL: 60 combined (OT/PT/ST)    Authorization - Number of Visits 20    OT Start Time 1403    OT Stop Time 1445    OT Time Calculation (min) 42 min    Activity Tolerance Patient tolerated treatment well    Behavior During Therapy WFL for tasks assessed/performed                         Past Medical History:  Diagnosis Date   Stroke [facility])    Past Surgical History:  Procedure Laterality Date   BUBBLE STUDY  02/15/2022   Procedure: BUBBLE STUDY;  Surgeon: Werner Lean, MD;  Location: [place];  Service: Cardiovascular;;   IR CT HEAD LTD  02/11/2022   IR CT HEAD LTD  02/11/2022   IR CT HEAD LTD  02/11/2022   IR PERCUTANEOUS ART THROMBECTOMY/INFUSION INTRACRANIAL INC DIAG ANGIO  02/11/2022   IR PERCUTANEOUS ART THROMBECTOMY/INFUSION INTRACRANIAL INC DIAG ANGIO  02/11/2022   IR US GUIDE VASC ACCESS RIGHT  02/11/2022   IR US GUIDE VASC ACCESS RIGHT  02/11/2022   RADIOLOGY WITH ANESTHESIA N/A 02/11/2022   Procedure: IR WITH ANESTHESIA;  Surgeon: Radiologist, Medication, MD;  Location: [place];  Service: Radiology;  Laterality: N/A;   RADIOLOGY WITH ANESTHESIA N/A 02/11/2022   Procedure: RADIOLOGY WITH ANESTHESIA;  Surgeon: Radiologist, Medication, MD;  Location: [place];  Service: Radiology;  Laterality: N/A;   TEE WITHOUT CARDIOVERSION N/A 02/15/2022   Procedure: TRANSESOPHAGEAL ECHOCARDIOGRAM (TEE);  Surgeon: Werner Lean, MD;  Location: [facility] ENDOSCOPY;  Service: Cardiovascular;  Laterality: N/A;   Patient Active Problem List   Diagnosis Date Noted    Hypocalcemia 03/22/2022   DVT, lower extremity, distal ([place]) 03/22/2022   Obesity 03/22/2022   Elevated LDL cholesterol level 03/22/2022   Acute ischemic left middle cerebral artery (MCA) stroke ([place]) 02/18/2022   Cerebrovascular accident (CVA) due to occlusion of left middle cerebral artery ([place]) 02/11/2022    ONSET DATE: referral 07/15/22 (CVA 02/11/22)  REFERRING DIAG: I63.9 (ICD-10-CM) - Cerebrovascular accident (CVA), unspecified mechanism ([place]) R13.10 (ICD-10-CM) - Dysphagia, unspecified type   THERAPY DIAG:  Hemiplegia and hemiparesis following cerebral infarction affecting right dominant side ([place])  Unsteadiness on feet  Muscle weakness (generalized)  Other lack of coordination  Other disturbances of skin sensation  Rationale for Evaluation and Treatment Rehabilitation  SUBJECTIVE:   SUBJECTIVE STATEMENT: Pt's spouse reporting pt has difficulty with release of grasp. 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 No  FALLS: Has patient fallen in last 6 months? Yes. Number of falls 1  PLOF: Independent, Independent with basic ADLs, and Independent with gait  PATIENT GOALS: continue to make gains, independence with getting dressed   OBJECTIVE:   HAND DOMINANCE: Right  FUNCTIONAL OUTCOME MEASURES: FOTO: 11  UPPER EXTREMITY ROM    Active ROM: Pt with no AROM during evaluation.  Pt was able to elicit min shoulder elevation and slight gross grasp when cued and provided increased time to initiate movement.   09/22/22: Pt still with no AROM in upright position, is able to elicit slight internal rotation and mod elbow flexion/extension in gravity assisted position. [id number]: Pt eliciting trace shoulder flexion, 44* elbow flexion, -16* elbow extension, and able to elicit thumb extension with shoulder internal  rotation.  Pt demonstrating ~30% of scapular retraction and elevation with intermitent tactile cues.     Passive ROM Right Eval - 9/25 Right 11/23/22  Shoulder flexion 100 120  Shoulder abduction  95  Shoulder adduction    Shoulder extension    Shoulder internal rotation WNL Unable to tolerate reaching past torso towards back pocket  Shoulder external rotation onset of pain >5-10* from neutral Onset of pain > 30*  Middle trapezius    Lower trapezius    Elbow flexion WNL WNL  Elbow extension WNL WNL  Wrist flexion WNL WNL  Wrist extension WNL WNL  Wrist ulnar deviation    Wrist radial deviation    Wrist pronation    Wrist supination    (Blank rows = not tested)  HAND FUNCTION: 11/23/22: loose gross grasp with increased time for initiation, unable to release grasp  SENSATION: Difficult to assess due to expressive aphasia  MUSCLE TONE: RUE: Mild and Hypertonic  ------------------------------------------------------------------------------------------------------------------------------------------------------------ (objective measures above completed at initial evaluation unless otherwise dated)  TODAY'S TREATMENT: 12/28/22 NMR: engaged in bimanual task with towel slides with BUE, OT facilitating increased ROM on RUE and providing increased support to compensate for decreased shoulder activation.  Transitioned to utilizing walking poles while in sitting with focus on pushing BUE forward and backward in symmetry, OT providing support at R wrist and under elbow to decrease impact of gravity as well as support for shoulder girdle.  Increased challenge to pt then completing asymmetrical movement alternating forward/backwards movements with poles.  Pt initially requiring increased assist, fading to support for positioning with pt demonstrating ~50% of movement on R compared to full range on L. Supine shoulder abduction with and without dowel.  OT providing hand over hand to facilitate R  hand placement to allow for proper ROM and technique.  Pt demonstrating difficulty achieving supinated position and difficulty maintaining grasp on dowel.  Therefore OT modified task to completing without dowel to demonstrate proper technique of shoulder abduction. Pt continues to require mod multimodal cues and intermittent tactile cues to complete ROM>    12/02/22 NMR: engaged in rolling large therapy ball with use of hand over hand to facilitate increased ROM and activation of RUE.  Completed forward/backward and side to side rolling with focus on increased ROM.  OT then challenged pt to complete with only RUE.  Pt able to complete forward backward rolling of large therapy ball with intermittent use of trunk to compensate for decreased shoulder activation.  Transitioned to rolling small ball on thigh.  Pt able to initiate and control movement to allow rolling of ball forward/backward and side to side without knocking ball off of thigh.  Educated on functional carryover to washing thighs in shower.  Discussed use of wash mitt to allow functional use of arm without needing functional grasp. Horizontal abduction/adduction: engaged in transitioning hand from mat table at side <> thigh with focus on shoulder elevation and horizontal abduction/adduction.  Pt able to complete with increased effort and initial overshooting however improved with repetition.  Towel slides on table top with focus on horizontal abduction/adduction.  Pt continues to require assistance for abduction, however able to complete adduction against mod resistance. AE: pt still lacking functional grasp and could benefit from use of adaptive equipment to accommodate for decreased grasp, however still lacks sufficient shoulder movement against gravity to functionally utilize AE at this time.  Will continue to assess and recommend as appropriate to increase functional use/incorporation of RUE into tasks.    11/23/22 NMR:  Sitting: engaged in  scapular retraction and shoulder elevation in sitting on mat with min tactile cues and intermittent tapping at shoulder to facilitate increased activation.  Pt able to complete ~30% of movement with above cues.  Pt benefiting from increased time and cues to go slow to allow for initiation of shoulder/scapular movement on R. Supine: pt able to elicit abduction in supine with increased time and tactile cues.  OT providing facilitation for improved alignment and cues to visually attend to UE during movements.  Pt tolerating PROM for assessment with improved tolerance to shoulder flexion, still limited with abduction due to pain and onset of tone. Standing: pt able to achieve 44* elbow flexion in standing, incorporating min internal rotation at shoulder and controlled breathing.  However able to replicate with cues to focus on breathing during movements.  OT educated on importance of increased focus on AAROM/PROM of extension as pt is developing flexor synergy pattern movement.   AAROM: Utilized UE ranger and then transitioning to therapist supported movement with focus on motor control and decreasing shoulder internal rotation.  OT providing tactile cues to decrease internal rotation with good body mechanics and positioning, as pt with tendency to compensate with internal rotation. Pt able to recognize onset of compensatory movements with min cues and able to make attempts at correcting.  OT providing stretch at end ranges.   PATIENT EDUCATION: Ongoing condition-specific education related to therapeutic interventions completed this session Person educated: Patient and Spouse Education method: Explanation, Demonstration, Tactile cues, Verbal cues, and Handouts Education comprehension: verbalized understanding and needs further education   HOME EXERCISE PROGRAM: Written exercises of shoulder and elbow in supine with cues for hand placement and facilitation.   Self-ROM handout from [facility] lab  Access Code:  [email or web address] URL: [email or web address] Date: 12/28/2022 Prepared by: [facility]  Exercises - Supine Shoulder Flexion AAROM with Hands Clasped  - 1 x daily - 1 sets - 10 reps - Supine Shoulder Abduction PROM with Caregiver  - 1 x daily - 10 reps - Supine Shoulder Abduction AAROM with Dowel  - 1 x daily - 3 x weekly - 10 reps - Seated Elbow Extension and Shoulder External Rotation AAROM at Table with Towel  - 1 x daily - 1 sets - 10 reps - Finger Extension with Wrist Extension Caregiver PROM  - 1 x daily - 1 sets - 10 reps - Seated Dowel Chest Press  - 3 x weekly - 1-2 sets - 10 reps - Seated Shoulder Flexion with Dowel to 90  - 3 x weekly - 1-2 sets - 10 reps  GOALS: Goals reviewed with patient? Yes  SHORT TERM GOALS:   12/24/22  Pt and spouse will be independent with advanced PROM and AAROM HEP for shoulder stability and shoulder and elbow ROM. Baseline: Goal status: MET - 12/28/22  2.  Pt and spouse will verbalize understanding of AE and/or task modifications as needed for fastening shoes.  Baseline:  Goal status: IN PROGRESS   LONG TERM GOALS: Target date 01/21/23  Patient  will be able to incorporate RUE into UB/LB dressing task at stabilizer to gross assist level. Baseline: Goal status: IN PROGRESS  2.   Pt will utilize RUE as gross assist during grooming tasks (such as washing hands and face). Baseline:  Goal status: IN PROGRESS  3.   Pt and spouse will verbalize and/or  demonstrate understanding of advanced treatment strategies and implementation at home (NMES, WB) to facilitate increased motor recovery.  Baseline:  Goal status: IN PROGRESS  4.   Pt will demonstrate ability to utilize BUE during mobility tasks to pushup from chair with BUE on arm rests.  Baseline:  Goal status: IN PROGRESS  5.   Pt will demonstrate ability to don/doff footwear (to include socks and shoes) with supervision and use of AE  PRN.  Baseline:  Goal status: IN PROGRESS  ASSESSMENT:  CLINICAL IMPRESSION: Treatment session with focus on NMR with RUE.  Pt demonstrating loose gross grasp, requiring hand over hand during BUE task with ski poles, but pt demonstrating good activation when provided with support at elbow for improved shoulder positioning.  OT educated on tenodesis grasp and facilitating wrist flexion to aid in release of grasp.  PERFORMANCE DEFICITS in functional skills including ADLs, IADLs, coordination, sensation, tone, ROM, strength, pain, flexibility, FMC, GMC, mobility, balance, body mechanics, vision, and UE functional use and psychosocial skills including environmental adaptation and routines and behaviors.   IMPAIRMENTS are limiting patient from ADLs and IADLs.   COMORBIDITIES may have co-morbidities  that affects occupational performance. Patient will benefit from skilled OT to address above impairments and improve overall function.   PLAN: OT FREQUENCY: 1-2x/week  OT DURATION: 8 weeks  PLANNED INTERVENTIONS: self care/ADL training, therapeutic exercise, therapeutic activity, neuromuscular re-education, manual therapy, passive range of motion, balance training, functional mobility training, splinting, electrical stimulation, compression bandaging, moist heat, cryotherapy, patient/family education, visual/perceptual remediation/compensation, psychosocial skills training, energy conservation, coping strategies training, and DME and/or AE instructions  RECOMMENDED OTHER SERVICES: N/A  CONSULTED AND AGREED WITH PLAN OF CARE: Patient and family member/caregiver  PLAN FOR NEXT SESSION: Continue w/ scapular stability: shoulder shrugs, rolls, and scapular retraction; WB and dynamic standing, supine and sidelying NMR.  Increased focus on incorporation of RUE into functional tasks.  E-stim as cleared by MD.   Simonne Come, OTR/L 12/28/2022, 2:06 PM

## 2022-12-28 NOTE — Therapy (Unsigned)
OUTPATIENT PHYSICAL THERAPY NEURO TREATMENT   Patient Name: Gordon Fisher MRN: [id number] DOB:06-28-1961, 62 y.o., male Today's Date: 12/29/2022   PCP: None listed REFERRING PROVIDER: Jennye Boroughs, MD       PT End of Session - 12/28/22 1321     Visit Number 23    Number of Visits 40    Date for PT Re-Evaluation 02/24/23    Authorization Type [employer] 2024-PT, OT, speech combined 34 visits    Authorization - Visit Number 4   last visit wasnt' counted, but should have been visit 3   Authorization - Number of Visits 30    PT Start Time 1319    PT Stop Time 1400    PT Time Calculation (min) 41 min    Activity Tolerance Patient tolerated treatment well    Behavior During Therapy WFL for tasks assessed/performed                       Past Medical History:  Diagnosis Date   Stroke [facility])    Past Surgical History:  Procedure Laterality Date   BUBBLE STUDY  02/15/2022   Procedure: BUBBLE STUDY;  Surgeon: Werner Lean, MD;  Location: [place];  Service: Cardiovascular;;   IR CT HEAD LTD  02/11/2022   IR CT HEAD LTD  02/11/2022   IR CT HEAD LTD  02/11/2022   IR PERCUTANEOUS ART THROMBECTOMY/INFUSION INTRACRANIAL INC DIAG ANGIO  02/11/2022   IR PERCUTANEOUS ART THROMBECTOMY/INFUSION INTRACRANIAL INC DIAG ANGIO  02/11/2022   IR US GUIDE VASC ACCESS RIGHT  02/11/2022   IR US GUIDE VASC ACCESS RIGHT  02/11/2022   RADIOLOGY WITH ANESTHESIA N/A 02/11/2022   Procedure: IR WITH ANESTHESIA;  Surgeon: Radiologist, Medication, MD;  Location: [place];  Service: Radiology;  Laterality: N/A;   RADIOLOGY WITH ANESTHESIA N/A 02/11/2022   Procedure: RADIOLOGY WITH ANESTHESIA;  Surgeon: Radiologist, Medication, MD;  Location: [place];  Service: Radiology;  Laterality: N/A;   TEE WITHOUT CARDIOVERSION N/A 02/15/2022   Procedure: TRANSESOPHAGEAL ECHOCARDIOGRAM (TEE);  Surgeon: Werner Lean, MD;  Location: [facility] ENDOSCOPY;  Service: Cardiovascular;  Laterality: N/A;    Patient Active Problem List   Diagnosis Date Noted   Hypocalcemia 03/22/2022   DVT, lower extremity, distal ([place]) 03/22/2022   Obesity 03/22/2022   Elevated LDL cholesterol level 03/22/2022   Acute ischemic left middle cerebral artery (MCA) stroke ([place]) 02/18/2022   Cerebrovascular accident (CVA) due to occlusion of left middle cerebral artery ([place]) 02/11/2022    ONSET DATE: April 2023  REFERRING DIAG: I63.9 (ICD-10-CM) - Cerebrovascular accident (CVA), unspecified mechanism ([place]) R13.10 (ICD-10-CM) - Dysphagia, unspecified type   THERAPY DIAG:  Muscle weakness (generalized)  Unsteadiness on feet  Other abnormalities of gait and mobility  Rationale for Evaluation and Treatment Rehabilitation  SUBJECTIVE:  SUBJECTIVE STATEMENT:  Things going well.  Wife reports doing exercises at home.  Pt accompanied by: significant other  PERTINENT HISTORY: 02/11/2022 patient developed right-sided weakness, aphasia, went to hospital for evaluation.  Code stroke was activated.  He received TNK and then transferred to [facility].  He underwent cerebral angiogram with thrombectomy.  Unfortunately vessel reoccluded later the day and the second thrombectomy was attempted.  Left M2 dissection was identified and treated with stent but unfortunately stent demonstrated reocclusion   PAIN:  Are you having pain? No 0/10  PRECAUTIONS: Shoulder and Fall, Aphasia   WEIGHT BEARING RESTRICTIONS No  FALLS: Has patient fallen in last 6 months? Yes. Number of falls 1, required 3-assist to recover  LIVING ENVIRONMENT: Lives with: lives with their spouse Lives in: House/apartment Stairs: Yes: Internal: flight steps; bilateral but cannot reach both and External: 4-5 steps; ramp is also present.  Has following  equipment at home: Hemi walker, Wheelchair (manual), and sit-stand lift  PLOF: Independent  PATIENT GOALS regain independence  OBJECTIVE:     TREATMENT    TODAY'S TREATMENT: 12/28/2022 Activity Comments  Sit<>stand from mat surface, no UE support, RLE posterior position x 10, then standing on Airex, equal foot position x 10 Supervision, cues to look ahead upon standing up  Box/square step clockwise and counter clockwise x 2 reps holding to fixed support, then x 2 reps with cane   RLE step taps to 6" obstacle for hip/knee flexion RLE, then progress to alternating legs x 10; then performes alt step taps at 4" cabinet shelf UE support  Forward/back walking at counter, verbal, visual, tactile cues for increased hip extension RLE Able to improve with repetition; min guard from therapist  Sidestepping along counter 3 reps, R and L, cues for R foot clearance   Gait training between activities (25-30 ft x 4 reps), then additional 50 ft x 4 reps with SPC, min guard assist    Access Code: [email or web address] URL: [email or web address] Date: 12/28/2022 Prepared by: [facility]  Exercises - Forward Backward Weight Shift with Counter Support  - 1 x daily - 5 x weekly - 2 sets - 10 reps - Side to Side Weight Shift with Counter Support  - 1 x daily - 5 x weekly - 2 sets - 10 reps - Mini Squat with Counter Support  - 1 x daily - 5 x weekly - 2 sets - 10 reps - Stride Stance Weight Shift  - 1 x daily - 5 x weekly - 2 sets - 10 reps - Sit to stand in stride stance  - 2 x daily - 7 x weekly - 1-2 sets - 5 reps - Supine Bridge with Mini Swiss Ball Between Knees  - 1 x daily - 5 x weekly - 2 sets - 10 reps - Supine Heel Slide  - 1 x daily - 5 x weekly - 2 sets - 10 reps - Seated Isometric Hip Adduction with Ball  - 1 x daily - 5 x weekly - 2 sets - 10 reps - 3 sec hold - Alternating Step Taps with Counter Support  - 1 x daily - 5 x weekly - 1 sets - 3-5 reps -  Side Stepping with Counter Support  - 1 x daily - 5 x weekly - 1 sets - 3 reps     PATIENT EDUCATION: Education details: updates on HEP Person educated: Patient and Spouse Education method: Explanation, Demonstration, Tactile cues, Verbal  cues, and Handouts Education comprehension: verbalized understanding and returned demonstration      Below measures were taken at time of initial evaluation unless otherwise specified:     DIAGNOSTIC FINDINGS: Follow-up MRI brain done revealing small areas of acute infarct in left-MCA territory with mild hemorrhagic transformation in left parietal lobe and left-MCA bifurcation residual vascular thrombus   COGNITION: Overall cognitive status:  Difficult to assess due to aphasia   SENSATION: Light touch: Impaired  Proprioception: Impaired   COORDINATION: RLE impaired, some deficits on left foot with alternating movements and isolated movements  EDEMA:  None   MUSCLE TONE: RLE: Modifed Ashworth Scale 2 = More marked increase in muscle tone through most of the ROM, but affected part(s) easily moved right quad most affected, no clonus right plantarflexors   MUSCLE LENGTH: Able to achieve full right knee extension to PROM  DTRs:  Patella 3+ = Brisk  POSTURE: No Significant postural limitations  LOWER EXTREMITY ROM:     LLE WNL, RLE AROM limited by weakness  LOWER EXTREMITY MMT:    MMT Right Eval Left Eval  Hip flexion 3- 5  Hip extension    Hip abduction 3- 4  Hip adduction 3- 5  Hip internal rotation    Hip external rotation    Knee flexion 3 5  Knee extension 2+ 5  Ankle dorsiflexion 0 5  Ankle plantarflexion 1 3+  Ankle inversion    Ankle eversion    (Blank rows = not tested)  BED MOBILITY:  NT  TRANSFERS: Assistive device utilized: Hemi walker  Sit to stand: Modified independence Stand to sit: Modified independence Chair to chair: Modified independence Floor:  Total assist --mod A for half kneeling  RAMP:   Level of Assistance: CGA Assistive device utilized: Hemi walker Ramp Comments:   CURB:  Level of Assistance: Min A Assistive device utilized: Nutritional therapist Comments: cues in sequence  STAIRS:  Level of Assistance: SBA  Stair Negotiation Technique: Step to Pattern with Single Rail on Left  Number of Stairs: 15   Height of Stairs: 4-6"  Comments: cues and guarding  GAIT: Gait pattern: step to pattern, decreased ankle dorsiflexion- Right, and circumduction- Right Distance walked: 150 Assistive device utilized: Quad cane large base Level of assistance: Modified independence Comments: deficits during turning   FOTO 47%  TODAY'S TREATMENT:  assessment   PATIENT EDUCATION: Education details: assessment findings, PT scope of practice Person educated: Patient and Spouse Education method: Explanation Education comprehension: verbalized understanding     GOALS: Goals reviewed with patient? Yes  SHORT TERM GOALS: Target date: 01/13/2023      Patient will perform Advanced HEP with family/caregiver supervision for improved strength, balance, transfers, and gait  Baseline: indep with initial HEP Goal status: INITIAL  2.  Demo improved safety and ability to access home environment ambulating grass and uneven surfaces w/ SBA and least restrictive AD Baseline: CGA-min A grass/slopes/gravel Goal status: IN PROGRESS   4. Demonstrate improved gait speed to 1.8 ft/sec with right AFO and least restrictive AD to improve efficiency of gait  Baseline: 0.68 ft/sec at eval; (09/20/22) 1.59 ft/sec w/ platform RW and AFO; (12/02/22) 1.4 ft/sec w/ WBQC and AFO  Goal status: IN PROGRESS  5. Improve mobility and reduce level of assist performing floor to stand transfers w/ SBA  Baseline: max A  Goal status: IN PROGRESS  LONG TERM GOALS: Target date: 02/24/2023    Demonstrate improved functional status and quality of life meeting FOTO predicted  outcomes (59% predicted) Baseline:  47% Goal status:IN PROGRESS  2.  Demonstrate modified independent ambulation x 300 ft uneven surfaces using least restrictive AD in order to improve safety with home environment Baseline: CGA-min A w/ WBQC Goal status: IN PROGRESS  3.  Manifest improved safety with gait per time of 18 sec TUG test using least restrictive AD Baseline: 34.75 sec w/ HW; (09/20/22) 30.69 w/ HW and right AFO; (12/02/22) 23 sec w/ WBQC Goal status: IN PROGRESS   4. Stair ambulation x 15 steps with set-up assist to improve safety in home environment and reduce burden of care  Baseline: SBA with single HR, using AFO; (12/02/22) supervision  Goal status: On-going      ASSESSMENT:  CLINICAL IMPRESSION: Skilled PT session today focused on NMR to RLE for improved timing and coordination of gait pattern, along with gait training with SPC.  Pt notes increased confidence with quad cane, but no overt LOB with use of SPC.  Updated HEP to reflect more standing balance/strengthening exercises.  He will continue to benefit from skilled PT towards goals for improved overall functional mobility and decreased fall risk.  OBJECTIVE IMPAIRMENTS Abnormal gait, decreased activity tolerance, decreased balance, decreased coordination, decreased knowledge of use of DME, decreased mobility, difficulty walking, decreased strength, impaired sensation, impaired tone, impaired UE functional use, and improper body mechanics.   ACTIVITY LIMITATIONS carrying, lifting, bending, standing, squatting, stairs, transfers, bathing, toileting, dressing, reach over head, and locomotion level  PARTICIPATION LIMITATIONS: meal prep, cleaning, laundry, interpersonal relationship, driving, shopping, community activity, occupation, yard work, and leisure activities  [place] Time since onset of injury/illness/exacerbation are also affecting patient's functional outcome.   REHAB POTENTIAL: Excellent  CLINICAL DECISION MAKING: Evolving/moderate  complexity  EVALUATION COMPLEXITY: Moderate  PLAN: PT FREQUENCY: 1-2/wk  PT DURATION: 12 weeks  PLANNED INTERVENTIONS: Therapeutic exercises, Therapeutic activity, Neuromuscular re-education, Balance training, Gait training, Patient/Family education, Self Care, Joint mobilization, Stair training, Vestibular training, Canalith repositioning, Orthotic/Fit training, DME instructions, Aquatic Therapy, Dry Needling, Electrical stimulation, Spinal mobilization, Cryotherapy, Moist heat, Splintting, Taping, and Manual therapy  PLAN FOR NEXT SESSION: Check additions to HEP and try to add retro gait to HEP.  Outdoor quad cane gait training including ramps, curbs, trial SPC.  Hip ABD strengthening/flexion/extensors also, half kneeling with folded gym mat to progress floor to stand    Mady Haagensen, PT 12/29/22 12:54 PM Phone: [phone] Fax: [place] [facility] [address] [place], [place] [address] Phone # [phone] Fax # [phone]

## 2023-01-01 LAB — COLOGUARD: COLOGUARD: NEGATIVE

## 2023-01-04 ENCOUNTER — Ambulatory Visit: Payer: 59

## 2023-01-04 ENCOUNTER — Ambulatory Visit: Payer: 59 | Attending: Physical Medicine & Rehabilitation

## 2023-01-04 ENCOUNTER — Ambulatory Visit: Payer: 59 | Admitting: Occupational Therapy

## 2023-01-04 DIAGNOSIS — R208 Other disturbances of skin sensation: Secondary | ICD-10-CM | POA: Insufficient documentation

## 2023-01-04 DIAGNOSIS — R482 Apraxia: Secondary | ICD-10-CM | POA: Insufficient documentation

## 2023-01-04 DIAGNOSIS — M6281 Muscle weakness (generalized): Secondary | ICD-10-CM | POA: Diagnosis present

## 2023-01-04 DIAGNOSIS — R2689 Other abnormalities of gait and mobility: Secondary | ICD-10-CM | POA: Diagnosis present

## 2023-01-04 DIAGNOSIS — I69351 Hemiplegia and hemiparesis following cerebral infarction affecting right dominant side: Secondary | ICD-10-CM | POA: Diagnosis present

## 2023-01-04 DIAGNOSIS — R262 Difficulty in walking, not elsewhere classified: Secondary | ICD-10-CM

## 2023-01-04 DIAGNOSIS — R278 Other lack of coordination: Secondary | ICD-10-CM | POA: Insufficient documentation

## 2023-01-04 DIAGNOSIS — R2681 Unsteadiness on feet: Secondary | ICD-10-CM

## 2023-01-04 DIAGNOSIS — R4701 Aphasia: Secondary | ICD-10-CM | POA: Insufficient documentation

## 2023-01-04 NOTE — Therapy (Signed)
OUTPATIENT SPEECH LANGUAGE PATHOLOGY TREATMENT   Patient Name: Gordon Fisher MRN: [id number] DOB:1961/04/05, 62 y.o., male Today's Date: 01/04/2023  PCP: TBD REFERRING PROVIDER: Jennye Boroughs, MD    End of Session - 01/04/23 2338     Visit Number 19    Number of Visits 31    Date for SLP Re-Evaluation 02/01/23    SLP Start Time 1406    SLP Stop Time  1446    SLP Time Calculation (min) 40 min    Activity Tolerance Patient tolerated treatment well                        Past Medical History:  Diagnosis Date   Stroke [facility])    Past Surgical History:  Procedure Laterality Date   BUBBLE STUDY  02/15/2022   Procedure: BUBBLE STUDY;  Surgeon: Werner Lean, MD;  [place];  Service: Cardiovascular;;   IR CT HEAD LTD  02/11/2022   IR CT HEAD LTD  02/11/2022   IR CT HEAD LTD  02/11/2022   IR PERCUTANEOUS ART THROMBECTOMY/INFUSION INTRACRANIAL INC DIAG ANGIO  02/11/2022   IR PERCUTANEOUS ART THROMBECTOMY/INFUSION INTRACRANIAL INC DIAG ANGIO  02/11/2022   IR US GUIDE VASC ACCESS RIGHT  02/11/2022   IR US GUIDE VASC ACCESS RIGHT  02/11/2022   RADIOLOGY WITH ANESTHESIA N/A 02/11/2022   Procedure: IR WITH ANESTHESIA;  Surgeon: Radiologist, Medication, MD;  Location: [place];  Service: Radiology;  Laterality: N/A;   RADIOLOGY WITH ANESTHESIA N/A 02/11/2022   Procedure: RADIOLOGY WITH ANESTHESIA;  Surgeon: Radiologist, Medication, MD;  Location: [place];  Service: Radiology;  Laterality: N/A;   TEE WITHOUT CARDIOVERSION N/A 02/15/2022   Procedure: TRANSESOPHAGEAL ECHOCARDIOGRAM (TEE);  Surgeon: Werner Lean, MD;  Location: [facility] ENDOSCOPY;  Service: Cardiovascular;  Laterality: N/A;   Patient Active Problem List   Diagnosis Date Noted   Hypocalcemia 03/22/2022   DVT, lower extremity, distal ([place]) 03/22/2022   Obesity 03/22/2022   Elevated LDL cholesterol level 03/22/2022   Acute ischemic left middle cerebral artery (MCA) stroke ([place]) 02/18/2022    Cerebrovascular accident (CVA) due to occlusion of left middle cerebral artery ([place]) 02/11/2022    ONSET DATE: 02-11-22   REFERRING DIAG: I63.9 (ICD-10-CM) - Cerebrovascular accident (CVA), unspecified mechanism ([place]) R13.10 (ICD-10-CM) - Dysphagia, unspecified type   THERAPY DIAG:  Aphasia  Verbal apraxia  Rationale for Evaluation and Treatment Rehabilitation  SUBJECTIVE:   SUBJECTIVE STATEMENT: "I think we used it a time or two for meals."  Pt accompanied by: significant other  PERTINENT HISTORY: Gordon Fisher is a 62 year old male in relatively good health who was admitted via [facility] on 02/11/22 with right sided weakness, right facial droop and slurred speech. CTH showed L-MCA hyperdense sign and he received TNK prior to transfer. CTA showed L-MCA MI occlusion and he underwent cerebral angio with thrombectomy and TICI 3 flow by Dr. Nelida Gores [name].  Follow up MRI brain done revealing small areas of acute infarct infarct in L-MCA territory with mild hemorrhagic transformation in left parietal lobe and L-MCA bifurcation residual vascular thrombus. He was taken back for thrombectomy with stent for dissection but had intra-stent occlusion which was unable to be recanalized. Post op CT showed small SAH which was stable on follow up. He was started on ASA/Brillinta. MBS 04/18 showed oral dysphagia with oral delay and no penetration or aspiration. He was started on D3, thins with supervision. Repeat MRI brain on 04/16 showed extensive evolving  acute ischemic L-MCA occlusion increased in size with associated edema, few additional small acute cortical subcortical infarcts involving contralateral right frontal lobe and left ACA distribution. Stroke felt to be due to unknown etiology and wife elected on Zio patch on discharge to rule out A fib. 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 [name] was  d/c'd from [facility] on 03-19-22, and has had HHST since that time until last week.   PAIN:  Are you having pain? No  PATIENT GOALS Pt indicated he would like talking to improve  OBJECTIVE:  DIAGNOSTIC FINDINGS: MRI without contrast 02/13/22 IMPRESSION: 1. Extensive evolving acute ischemic left MCA distribution infarct, increased in size as compared to previous MRI from 02/11/2022. Associated edema without significant midline shift. 2. Small volume acute subarachnoid hemorrhage at the posterior aspect of the left Sylvian fissure, stable. 3. Few additional small volume acute ischemic cortical and subcortical infarcts involving the contralateral right frontal lobe and left ACA distribution as above. 4. Underlying chronic microvascular ischemic disease.    PATIENT REPORTED OUTCOME MEASURES (PROM): Communication Effectiveness Survey: Pt scored 8/32 - higher scores indicate more effective communication/QOL.   TODAY'S TREATMENT:  01/04/23: Today SLP provided support for pt and [name] using Ling[employer]. SLP req'd to provide pt with mod cues for programming simple tasks (sound and text) and how to navigate to edit page. SLP questions the extent pt is using the device at home. SLP told wife that pt should use the device at LEAST 1-2 times daily and to have Josephine out and open so pt is encouraged to use it more. Pt to decr to once every other week next session.   12/28/22: Pt with new device. [name] now backing up regularly, after new icons/voices created. Today pt used Lingraphica independently for questions re: family and with occasional Fisher A primarily for language comprehension for SLP question stimuli and then rare min A for pt's navigating to correct response. Once on the page pt activated correct icon 90%. Homework to have [name] assist pt in generating 70% of new icons, she will do 30% herself for saving time. SLP reiterated eventually we want pt to program himslef but he needs practice to do  that.   12/13/22: Pt with new device and loaner device present today. Plan was for SLP to take loaner and return to [place] however [name] did not back up the loaner with all the farm icons and information she did since last session. SLP attempted this process which took 5 minutes but was unsuccessful. Pt req'd mod A-Fisher A consistently with two-word icon names as they were recorded so SLP trained/re-educated pt on how to make changes to timing of icon names by putting period and a space between the two words. Fading cues necessary ultimately to min A occasionally. SLP strongly stressed to pt and wife that pt needs to USE the device at home and provided possible opportunities for him to do so.  [name] to contact [employer] and see how to get loaner backup onto pt's device , by next session. Three more sessions after today appear sufficient - pt/wife agree.  12/02/22: Pt entered room with device and indicated correctly the people who were at his house yesterday. He was independent with accessing vegetables he will grown in the spring. SLP assisted (mod A occasionally) pt in programming icon for "seed" and "leaf lettuce". Pt req'd mod -Fisher cues for adding icons. Fisher/total A for spelling icon  names.  11/23/22: Pt has not been seen in over one month - verbal and receptive language skills have minimally improved. Pt has not spontaneously used device at home - however has used device when (rarely) prompted by wife. [name] has programmed device a little with farm items and garden items. SLP assisted pt and wife to relocate icons to more proper location/s. Pt req'd initial demo cues faded to occasional mod cues for editing icons' pictures and sound. With text, pt req'd faded cues to occasional min cues.  Continue all goals not met. Pt verbal expression improved dramatically when looking at picture and seeing icon name.   12/18/23[name] made some icons last week. Realized she had duplicated them when she saw them on  another page and realized she had put some icons on the wrong page. [name] is becoming more independent with deleting icons and is independent with moving icons up and down the page. He still requires occasional min A with adding and modifying icons but is improving with this as his exposure with the process continues. Pt currently without internet so he cannot search for icons or back up device at home.   10/11/22: SLP began educating pt/wife how to create icons using camera, altered pronunciation text field for "leesa", and searching online for photo. By session end pt was accessing orange three-bar icon in upper right hand corner independently when cued "what do you press to modify or add icons?", and moving icons independently. Pt and wife to make icons for family, and use for communicating food choices.  10-08-22: Lingraphica start trial date today. SLP introduced device to pt/wife. Pt with functional verbalization within 3 seconds of initiation for "ham", "tomato", "cheese", "[name]", and "sandwich". Pt produced "sandwich" spontaneously after 8 seconds and mutliple attempts, without label. SLP introduced how to move, add, and delete icons. Pt communicated his sandwich choice ("sandwich meat" originally, and then "ham" after it was added to page), and toppings choices (tomato, cheese) with consistent min-mod cues, faded to occasional min-mod cues. Pt and wife to use device over the weekend for food - - SLP to assist/teach pt and [name] how to add pictures to icons for family members next session.  09/27/22: Lingraphica not yet sent from [facility]. Should be here by pt's next appointment.  [name] told SLP names of people at their Thanksgiving celebration, with usual mod to Fisher assist. Visual cues were most effective on pt's most difficult words. SLP assessed pt's reading comprehension - 80% with 4-5 words.   11/8/23[name] stated they had been busy this week with appointments but still work at practicing some. SLP  initiated a 20-minute discussion today with Evette Doffing, and SLP explaining speech generating device in depth, showing [name] and [name] options for trial device and deciding on the Lingraphica Touch Talk. SLP to make referral for this in the next few days.  Family names were written with mod-Fisher A usually - pt got first 3 letters correct with childrens and grandchildrens' names. SLP showed [name] how to break up daughter's name to [id number]-[id number] to encourage brain's processing in a different way in order to foster new neural pathways to say the name.  09/06/22: SLP targeted verbal expression with picture naming and divergent naming. Pt notably demonstrates he knows the word/s he wants to say but is finding it difficult to pronounce them (verbal apraxia). Pt's expression improves with visual cues (oral cues from SLP for initial phoneme/sound) - 75% successful even without sound necessary - just visual cue  for articulator placement.   09/01/22: SLP worked with pt's verbal expression and auditory comprehension today. Simple conversation/sentences were understood 90% of the time, 100% with one repeat by speaker. With opposites/paired words, pt had 70% success, improved to 90% with min A. With cloze phrases/sentences pt's response accuracy improved with 1 syllable words, but overall success was 17% with min A, improved to 50% with mod-Fisher A. Often, pt knew the response but knew he was going to misarticulate the response. Written cues were helpful for pt here. SLP encouraged wife to cont with cloze phrases and paired words at home.  10/25/23[name] brought pt's cards today and SLP reviewed them with pt, modleing to wife cueing methods and hierarchy.Pt functionally said 6/7 salient objects (without written words) with mod-Fisher cues, and 1/7 spontaneously. He said family names with written cues 6/7 (functionally), and 1/7 wth Fisher cues Janett Billow"). Pt demo'd understnading of directions in context 75% today. SLP introduce  possibility of Lingraphica to pt and wife and educated about studies proving that AAC assists verbal communication and that pt may not have to have assistance with a AAC device but we will know more as time progresses.     PATIENT EDUCATION: Education details: see "Today's therapy" Person educated: Patient and Spouse Education method: Explanation and Demonstration Education comprehension: verbalized understanding and needs further education   GOALS: Goals reviewed with patient? Yes, in general  SHORT TERM GOALS: Target date: 08/31/2022 extended one week due to visits  Pt will name 6 salient family names when given picture and written name with consistent mod-Fisher cues in 3 sessions (precursor for speech generating device) Baseline:08-25-22. 09-01-22 Goal status: partially met  2.  Pt will produce simple phoneme/ bilabial+/a/  /a/, /ba/, /ma/, and/or /pa/ simultaneously with SLP 65% success (equal # reps/stimulus) in 2 sessions Baseline:  Goal status: deferred - pt to focus on salient items/terms  3.  Pt will demo understanding of simple commands in context 80% over three sessions Baseline: 08-25-22, 09-01-22 Goal status: Partially met  4.  Pt will indicate understanding of a complex sentence 85% of the time in 3 sessions (precursor for speech generating device) Baseline:  Goal status: not met  5.  Pt will copy written family names with 50% success in two sessions  Baseline:  Goal Status: met   LONG TERM GOALS: Target date: 10/24/2022 , 02/01/23  Pt will name 6 salient family names when given picture and written name with consistent mod cues in 3 sessions (precursor for speech generating device) Baseline: 12/28/22, 01-04-23 Goal status: ongoing  2.  Pt will IMITATE simple phoneme/bilabial+/a/  /a/, /ba/, /ma/, and/or /pa/  65% success (equal # reps/stimulus) in 2 sessions Baseline:  Goal status: deferred - working with pt on more salient words  3.  Pt will generate meaningful  message understood by [name] using speech generating device 60% of the time in 3 sessions Baseline:  Goal status: ongoing  4.  [name] will indicate that spontaneous communication is easier than during week of evaluation Baseline:  Goal status: Ongoing   ASSESSMENT:  CLINICAL IMPRESSION: [name] is a 62 y.o. male who cont to be seen today for treatment of verbal apraxia (as pt often indicates he knows the response but knows it will not be articulated correctly) and aphasia. Treatment involves use of pt's Lingraphica speech generating device (SGD). SEE TX NOTE from today for more details.   OBJECTIVE IMPAIRMENTS include expressive language, receptive language, and aphasia. These impairments are limiting patient from return  to work, managing medications, managing appointments, household responsibilities, ADLs/IADLs, and effectively communicating at home and in community. Factors affecting potential to achieve goals and functional outcome are ability to learn/carryover information and severity of impairments. Patient will benefit from skilled SLP services to address above impairments and improve overall function.  REHAB POTENTIAL: Good  PLAN: SLP FREQUENCY: 2x/week  SLP DURATION: 8 weeks  PLANNED INTERVENTIONS: Language facilitation, Environmental controls, Cueing hierachy, Internal/external aids, Functional tasks, and Multimodal communication approach    Abbygayle Helfand, CCC-SLP 01/04/2023, 11:39 PM

## 2023-01-04 NOTE — Therapy (Signed)
OUTPATIENT OCCUPATIONAL THERAPY NEURO TREATMENT NOTE  Patient Name: Gordon Fisher MRN: [id number] DOB:1961/06/24, 62 y.o., male Today's Date: 01/04/2023  PCP: none on file REFERRING PROVIDER: Jennye Boroughs, MD    OT End of Session - 01/04/23 1234     Visit Number 22    Number of Visits 30    Date for OT Re-Evaluation 01/21/23    Authorization Type [employer]    Authorization Time Period VL: 60 combined (OT/PT/ST)    Authorization - Number of Visits 20    OT Start Time 1234    OT Stop Time 1320    OT Time Calculation (min) 46 min    Activity Tolerance Patient tolerated treatment well    Behavior During Therapy WFL for tasks assessed/performed                          Past Medical History:  Diagnosis Date   Stroke [facility])    Past Surgical History:  Procedure Laterality Date   BUBBLE STUDY  02/15/2022   Procedure: BUBBLE STUDY;  Surgeon: Werner Lean, MD;  Location: [place];  Service: Cardiovascular;;   IR CT HEAD LTD  02/11/2022   IR CT HEAD LTD  02/11/2022   IR CT HEAD LTD  02/11/2022   IR PERCUTANEOUS ART THROMBECTOMY/INFUSION INTRACRANIAL INC DIAG ANGIO  02/11/2022   IR PERCUTANEOUS ART THROMBECTOMY/INFUSION INTRACRANIAL INC DIAG ANGIO  02/11/2022   IR US GUIDE VASC ACCESS RIGHT  02/11/2022   IR US GUIDE VASC ACCESS RIGHT  02/11/2022   RADIOLOGY WITH ANESTHESIA N/A 02/11/2022   Procedure: IR WITH ANESTHESIA;  Surgeon: Radiologist, Medication, MD;  Location: [place];  Service: Radiology;  Laterality: N/A;   RADIOLOGY WITH ANESTHESIA N/A 02/11/2022   Procedure: RADIOLOGY WITH ANESTHESIA;  Surgeon: Radiologist, Medication, MD;  Location: [place];  Service: Radiology;  Laterality: N/A;   TEE WITHOUT CARDIOVERSION N/A 02/15/2022   Procedure: TRANSESOPHAGEAL ECHOCARDIOGRAM (TEE);  Surgeon: Werner Lean, MD;  Location: [facility];  Service: Cardiovascular;  Laterality: N/A;   Patient Active Problem List   Diagnosis Date Noted    Hypocalcemia 03/22/2022   DVT, lower extremity, distal ([place]) 03/22/2022   Obesity 03/22/2022   Elevated LDL cholesterol level 03/22/2022   Acute ischemic left middle cerebral artery (MCA) stroke ([place]) 02/18/2022   Cerebrovascular accident (CVA) due to occlusion of left middle cerebral artery ([place]) 02/11/2022    ONSET DATE: referral 07/15/22 (CVA 02/11/22)  REFERRING DIAG: I63.9 (ICD-10-CM) - Cerebrovascular accident (CVA), unspecified mechanism ([place]) R13.10 (ICD-10-CM) - Dysphagia, unspecified type   THERAPY DIAG:  Muscle weakness (generalized)  Unsteadiness on feet  Hemiplegia and hemiparesis following cerebral infarction affecting right dominant side (HCC)  Other lack of coordination  Other disturbances of skin sensation  Rationale for Evaluation and Treatment Rehabilitation  SUBJECTIVE:   SUBJECTIVE STATEMENT: Pt's sister-in-law present and reports noticing increased grimacing with certain movements of RUE. 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 No  FALLS: Has patient fallen in last 6 months? Yes. Number of falls 1  PLOF: Independent, Independent with basic ADLs, and Independent with gait  PATIENT GOALS: continue to make gains, independence with getting dressed   OBJECTIVE:   HAND DOMINANCE: Right  FUNCTIONAL OUTCOME MEASURES: FOTO: 11  UPPER EXTREMITY ROM    Active ROM: Pt with no  AROM during evaluation.  Pt was able to elicit min shoulder elevation and slight gross grasp when cued and provided increased time to initiate movement.   09/22/22: Pt still with no AROM in upright position, is able to elicit slight internal rotation and mod elbow flexion/extension in gravity assisted position. [id number]: Pt eliciting trace shoulder flexion, 44* elbow flexion, -16* elbow extension, and able to elicit  thumb extension with shoulder internal rotation.  Pt demonstrating ~30% of scapular retraction and elevation with intermitent tactile cues.     Passive ROM Right Eval - 9/25 Right 11/23/22  Shoulder flexion 100 120  Shoulder abduction  95  Shoulder adduction    Shoulder extension    Shoulder internal rotation WNL Unable to tolerate reaching past torso towards back pocket  Shoulder external rotation onset of pain >5-10* from neutral Onset of pain > 30*  Middle trapezius    Lower trapezius    Elbow flexion WNL WNL  Elbow extension WNL WNL  Wrist flexion WNL WNL  Wrist extension WNL WNL  Wrist ulnar deviation    Wrist radial deviation    Wrist pronation    Wrist supination    (Blank rows = not tested)  HAND FUNCTION: 11/23/22: loose gross grasp with increased time for initiation, unable to release grasp  SENSATION: Difficult to assess due to expressive aphasia  MUSCLE TONE: RUE: Mild and Hypertonic  ------------------------------------------------------------------------------------------------------------------------------------------------------------ (objective measures above completed at initial evaluation unless otherwise dated)  TODAY'S TREATMENT: 01/04/23 NMR: engaged in sidelying shoulder flexion, extension, and elbow flexion and extension. OT utilizing sidelying to facilitate improved scapular protraction and retraction and increased shoulder flexion without gravity impacting mobility.  Attempted to engage in ROM on top of surface to allow for increased facilitation, however pt benefiting from tactile cues and arm positioned on therapist's forearm to further grade mobility and pressure.  Pt demonstrating improved shoulder activation with tactile cues, verbal cues to "push" and "pull", and support to forearm as pt frequently eliciting shoulder internal rotation limiting ROM.  Elbow flexion/extension: Pt demonstrating increased tone in elbow about 90* flexion.  OT able to range  arm through positioning with full flexion and extension to ~10* from neutral.  Pt demonstrating flexion from nearly straight to 90* and demonstrating extension from full flexion to ~100* flexion before tone setting in.  OT educated pt's spouse and sister in law on hand placement to facilitate PROM/stretching to elbow while ensuring alignment and no stress on shoulder. E-stim: attempted to utilize NMES to aid in shoulder flexion in sidelying, however minimal activation noted with stimulation, therefore resumed hand over hand facilitation from OT.     12/28/22 NMR: engaged in bimanual task with towel slides with BUE, OT facilitating increased ROM on RUE and providing increased support to compensate for decreased shoulder activation.  Transitioned to utilizing walking poles while in sitting with focus on pushing BUE forward and backward in symmetry, OT providing support at R wrist and under elbow to decrease impact of gravity as well as support for shoulder girdle.  Increased challenge to pt then completing asymmetrical movement alternating forward/backwards movements with poles.  Pt initially requiring increased assist, fading to support for positioning with pt demonstrating ~50% of movement on R compared to full range on L. Supine shoulder abduction with and without dowel.  OT providing hand over hand to facilitate R hand placement to allow for proper ROM and technique.  Pt demonstrating difficulty achieving supinated position and difficulty maintaining grasp on dowel.  Therefore OT modified  task to completing without dowel to demonstrate proper technique of shoulder abduction. Pt continues to require mod multimodal cues and intermittent tactile cues to complete ROM>    12/02/22 NMR: engaged in rolling large therapy ball with use of hand over hand to facilitate increased ROM and activation of RUE.  Completed forward/backward and side to side rolling with focus on increased ROM.  OT then challenged pt to  complete with only RUE.  Pt able to complete forward backward rolling of large therapy ball with intermittent use of trunk to compensate for decreased shoulder activation.  Transitioned to rolling small ball on thigh.  Pt able to initiate and control movement to allow rolling of ball forward/backward and side to side without knocking ball off of thigh.  Educated on functional carryover to washing thighs in shower.  Discussed use of wash mitt to allow functional use of arm without needing functional grasp. Horizontal abduction/adduction: engaged in transitioning hand from mat table at side <> thigh with focus on shoulder elevation and horizontal abduction/adduction.  Pt able to complete with increased effort and initial overshooting however improved with repetition.  Towel slides on table top with focus on horizontal abduction/adduction.  Pt continues to require assistance for abduction, however able to complete adduction against mod resistance. AE: pt still lacking functional grasp and could benefit from use of adaptive equipment to accommodate for decreased grasp, however still lacks sufficient shoulder movement against gravity to functionally utilize AE at this time.  Will continue to assess and recommend as appropriate to increase functional use/incorporation of RUE into tasks.  PATIENT EDUCATION: Ongoing condition-specific education related to therapeutic interventions completed this session Person educated: Patient and Spouse Education method: Explanation, Demonstration, Tactile cues, Verbal cues, and Handouts Education comprehension: verbalized understanding and needs further education   HOME EXERCISE PROGRAM: Written exercises of shoulder and elbow in supine with cues for hand placement and facilitation.   Self-ROM handout from [facility] lab  Access Code: [email or web address] URL: [email or web address] Date: 12/28/2022 Prepared by: [facility]  Exercises - Supine Shoulder Flexion AAROM with Hands Clasped  - 1 x daily - 1 sets - 10 reps - Supine Shoulder Abduction PROM with Caregiver  - 1 x daily - 10 reps - Supine Shoulder Abduction AAROM with Dowel  - 1 x daily - 3 x weekly - 10 reps - Seated Elbow Extension and Shoulder External Rotation AAROM at Table with Towel  - 1 x daily - 1 sets - 10 reps - Finger Extension with Wrist Extension Caregiver PROM  - 1 x daily - 1 sets - 10 reps - Seated Dowel Chest Press  - 3 x weekly - 1-2 sets - 10 reps - Seated Shoulder Flexion with Dowel to 90  - 3 x weekly - 1-2 sets - 10 reps  GOALS: Goals reviewed with patient? Yes  SHORT TERM GOALS:   12/24/22  Pt and spouse will be independent with advanced PROM and AAROM HEP for shoulder stability and shoulder and elbow ROM. Baseline: Goal status: MET - 12/28/22  2.  Pt and spouse will verbalize understanding of AE and/or task modifications as needed for fastening shoes.  Baseline:  Goal status: IN PROGRESS   LONG TERM GOALS: Target date 01/21/23  Patient will be able to incorporate RUE into UB/LB dressing task at stabilizer to gross assist level. Baseline: Goal status: IN PROGRESS  2.   Pt will utilize RUE as gross assist during grooming tasks (such as  washing hands and face). Baseline:  Goal status: IN PROGRESS  3.   Pt and spouse will verbalize and/or  demonstrate understanding of advanced treatment strategies and implementation at home (NMES, WB) to facilitate increased motor recovery.  Baseline:  Goal status: IN PROGRESS  4.   Pt will demonstrate ability to utilize BUE during mobility tasks to pushup from chair with BUE on arm rests.  Baseline:  Goal status: IN PROGRESS  5.   Pt will demonstrate ability to don/doff footwear (to include socks and shoes) with supervision and use of AE PRN.  Baseline:  Goal status: IN PROGRESS  ASSESSMENT:  CLINICAL IMPRESSION: Treatment session with focus on NMR of RUE.  Pt  demonstrating increased flexor tone in R elbow, however able to range through nearly full ROM.  Pt demonstrating improved activation of shoulder flexion and extension in sidelying with multimodal cues to increase activation.    PERFORMANCE DEFICITS in functional skills including ADLs, IADLs, coordination, sensation, tone, ROM, strength, pain, flexibility, FMC, GMC, mobility, balance, body mechanics, vision, and UE functional use and psychosocial skills including environmental adaptation and routines and behaviors.   IMPAIRMENTS are limiting patient from ADLs and IADLs.   COMORBIDITIES may have co-morbidities  that affects occupational performance. Patient will benefit from skilled OT to address above impairments and improve overall function.   PLAN: OT FREQUENCY: 1-2x/week  OT DURATION: 8 weeks  PLANNED INTERVENTIONS: self care/ADL training, therapeutic exercise, therapeutic activity, neuromuscular re-education, manual therapy, passive range of motion, balance training, functional mobility training, splinting, electrical stimulation, compression bandaging, moist heat, cryotherapy, patient/family education, visual/perceptual remediation/compensation, psychosocial skills training, energy conservation, coping strategies training, and DME and/or AE instructions  RECOMMENDED OTHER SERVICES: N/A  CONSULTED AND AGREED WITH PLAN OF CARE: Patient and family member/caregiver  PLAN FOR NEXT SESSION: Continue w/ scapular stability: shoulder shrugs, rolls, and scapular retraction; WB and dynamic standing, supine and sidelying NMR.  Increased focus on incorporation of RUE into functional tasks.  E-stim as cleared by MD.   Simonne Come, OTR/L 01/04/2023, 2:11 PM

## 2023-01-04 NOTE — Therapy (Signed)
OUTPATIENT PHYSICAL THERAPY NEURO TREATMENT   Patient Name: Gordon Fisher MRN: [id number] DOB:[birth year], 62 y.o., male Today's Date: 01/04/2023   PCP: None listed REFERRING PROVIDER: Jennye Boroughs, MD       PT End of Session - 01/04/23 1320     Visit Number 24    Number of Visits 40    Date for PT Re-Evaluation 02/24/23    Authorization Type [employer] 2024-PT, OT, speech combined 13 visits    Authorization - Visit Number 5    Authorization - Number of Visits 30    PT Start Time 1320   late with OT   PT Stop Time 1400    PT Time Calculation (min) 40 min    Activity Tolerance Patient tolerated treatment well    Behavior During Therapy WFL for tasks assessed/performed                       Past Medical History:  Diagnosis Date   Stroke [facility])    Past Surgical History:  Procedure Laterality Date   BUBBLE STUDY  02/15/2022   Procedure: BUBBLE STUDY;  Surgeon: Werner Lean, MD;  Location: [place];  Service: Cardiovascular;;   IR CT HEAD LTD  02/11/2022   IR CT HEAD LTD  02/11/2022   IR CT HEAD LTD  02/11/2022   IR PERCUTANEOUS ART THROMBECTOMY/INFUSION INTRACRANIAL INC DIAG ANGIO  02/11/2022   IR PERCUTANEOUS ART THROMBECTOMY/INFUSION INTRACRANIAL INC DIAG ANGIO  02/11/2022   IR US GUIDE VASC ACCESS RIGHT  02/11/2022   IR US GUIDE VASC ACCESS RIGHT  02/11/2022   RADIOLOGY WITH ANESTHESIA N/A 02/11/2022   Procedure: IR WITH ANESTHESIA;  Surgeon: Radiologist, Medication, MD;  Location: [place];  Service: Radiology;  Laterality: N/A;   RADIOLOGY WITH ANESTHESIA N/A 02/11/2022   Procedure: RADIOLOGY WITH ANESTHESIA;  Surgeon: Radiologist, Medication, MD;  Location: [place];  Service: Radiology;  Laterality: N/A;   TEE WITHOUT CARDIOVERSION N/A 02/15/2022   Procedure: TRANSESOPHAGEAL ECHOCARDIOGRAM (TEE);  Surgeon: Werner Lean, MD;  Location: [facility] ENDOSCOPY;  Service: Cardiovascular;  Laterality: N/A;   Patient Active Problem List   Diagnosis Date  Noted   Hypocalcemia 03/22/2022   DVT, lower extremity, distal ([place]) 03/22/2022   Obesity 03/22/2022   Elevated LDL cholesterol level 03/22/2022   Acute ischemic left middle cerebral artery (MCA) stroke ([place]) 02/18/2022   Cerebrovascular accident (CVA) due to occlusion of left middle cerebral artery ([place]) 02/11/2022    ONSET DATE: April 2023  REFERRING DIAG: I63.9 (ICD-10-CM) - Cerebrovascular accident (CVA), unspecified mechanism ([place]) R13.10 (ICD-10-CM) - Dysphagia, unspecified type   THERAPY DIAG:  Muscle weakness (generalized)  Unsteadiness on feet  Hemiplegia and hemiparesis following cerebral infarction affecting right dominant side (HCC)  Other abnormalities of gait and mobility  Difficulty in walking, not elsewhere classified  Rationale for Evaluation and Treatment Rehabilitation  SUBJECTIVE:  SUBJECTIVE STATEMENT:  Things going well.  Wife reports doing exercises at home.  Pt accompanied by: significant other  PERTINENT HISTORY: 02/11/2022 patient developed right-sided weakness, aphasia, went to hospital for evaluation.  Code stroke was activated.  He received TNK and then transferred to [facility].  He underwent cerebral angiogram with thrombectomy.  Unfortunately vessel reoccluded later the day and the second thrombectomy was attempted.  Left M2 dissection was identified and treated with stent but unfortunately stent demonstrated reocclusion   PAIN:  Are you having pain? No 0/10  PRECAUTIONS: Shoulder and Fall, Aphasia   WEIGHT BEARING RESTRICTIONS No  FALLS: Has patient fallen in last 6 months? Yes. Number of falls 1, required 3-assist to recover  LIVING ENVIRONMENT: Lives with: lives with their spouse Lives in: House/apartment Stairs: Yes: Internal: flight steps;  bilateral but cannot reach both and External: 4-5 steps; ramp is also present.  Has following equipment at home: Hemi walker, Wheelchair (manual), and sit-stand lift  PLOF: Independent  PATIENT GOALS regain independence  OBJECTIVE:    TODAY'S TREATMENT: 01/04/23 Activity Comments  Reactive balance   Prone hamstring tapping  Quad stretch   Prone heel squeeze   Tall kneeling   Retro-walking   Foot on step      TODAY'S TREATMENT: 12/28/2022 Activity Comments  Sit<>stand from mat surface, no UE support, RLE posterior position x 10, then standing on Airex, equal foot position x 10 Supervision, cues to look ahead upon standing up  Box/square step clockwise and counter clockwise x 2 reps holding to fixed support, then x 2 reps with cane   RLE step taps to 6" obstacle for hip/knee flexion RLE, then progress to alternating legs x 10; then performes alt step taps at 4" cabinet shelf UE support  Forward/back walking at counter, verbal, visual, tactile cues for increased hip extension RLE Able to improve with repetition; min guard from therapist  Sidestepping along counter 3 reps, R and L, cues for R foot clearance   Gait training between activities (25-30 ft x 4 reps), then additional 50 ft x 4 reps with SPC, min guard assist    Access Code: [email or web address] URL: [email or web address] Date: 12/28/2022 Prepared by: [facility]  Exercises - Forward Backward Weight Shift with Counter Support  - 1 x daily - 5 x weekly - 2 sets - 10 reps - Side to Side Weight Shift with Counter Support  - 1 x daily - 5 x weekly - 2 sets - 10 reps - Mini Squat with Counter Support  - 1 x daily - 5 x weekly - 2 sets - 10 reps - Stride Stance Weight Shift  - 1 x daily - 5 x weekly - 2 sets - 10 reps - Sit to stand in stride stance  - 2 x daily - 7 x weekly - 1-2 sets - 5 reps - Supine Bridge with Mini Swiss Ball Between Knees  - 1 x daily - 5 x weekly - 2 sets - 10  reps - Supine Heel Slide  - 1 x daily - 5 x weekly - 2 sets - 10 reps - Seated Isometric Hip Adduction with Ball  - 1 x daily - 5 x weekly - 2 sets - 10 reps - 3 sec hold - Alternating Step Taps with Counter Support  - 1 x daily - 5 x weekly - 1 sets - 3-5 reps - Side Stepping with Counter Support  - 1 x daily -  5 x weekly - 1 sets - 3 reps - Prone Knee Flexion  - 1 x daily - 7 x weekly - 3 sets - 10 reps - Tall Kneeling Hip Hinge  - 1 x daily - 7 x weekly - 3 sets - 10 reps    PATIENT EDUCATION: Education details: updates on HEP Person educated: Patient and Spouse Education method: Explanation, Demonstration, Tactile cues, Verbal cues, and Handouts Education comprehension: verbalized understanding and returned demonstration      Below measures were taken at time of initial evaluation unless otherwise specified:     DIAGNOSTIC FINDINGS: Follow-up MRI brain done revealing small areas of acute infarct in left-MCA territory with mild hemorrhagic transformation in left parietal lobe and left-MCA bifurcation residual vascular thrombus   COGNITION: Overall cognitive status:  Difficult to assess due to aphasia   SENSATION: Light touch: Impaired  Proprioception: Impaired   COORDINATION: RLE impaired, some deficits on left foot with alternating movements and isolated movements  EDEMA:  None   MUSCLE TONE: RLE: Modifed Ashworth Scale 2 = More marked increase in muscle tone through most of the ROM, but affected part(s) easily moved right quad most affected, no clonus right plantarflexors   MUSCLE LENGTH: Able to achieve full right knee extension to PROM  DTRs:  Patella 3+ = Brisk  POSTURE: No Significant postural limitations  LOWER EXTREMITY ROM:     LLE WNL, RLE AROM limited by weakness  LOWER EXTREMITY MMT:    MMT Right Eval Left Eval  Hip flexion 3- 5  Hip extension    Hip abduction 3- 4  Hip adduction 3- 5  Hip internal rotation    Hip external rotation     Knee flexion 3 5  Knee extension 2+ 5  Ankle dorsiflexion 0 5  Ankle plantarflexion 1 3+  Ankle inversion    Ankle eversion    (Blank rows = not tested)  BED MOBILITY:  NT  TRANSFERS: Assistive device utilized: Hemi walker  Sit to stand: Modified independence Stand to sit: Modified independence Chair to chair: Modified independence Floor:  Total assist --mod A for half kneeling  RAMP:  Level of Assistance: CGA Assistive device utilized: Hemi walker Ramp Comments:   CURB:  Level of Assistance: Min A Assistive device utilized: Nutritional therapist Comments: cues in sequence  STAIRS:  Level of Assistance: SBA  Stair Negotiation Technique: Step to Pattern with Single Rail on Left  Number of Stairs: 15   Height of Stairs: 4-6"  Comments: cues and guarding  GAIT: Gait pattern: step to pattern, decreased ankle dorsiflexion- Right, and circumduction- Right Distance walked: 150 Assistive device utilized: Quad cane large base Level of assistance: Modified independence Comments: deficits during turning   FOTO 47%  TODAY'S TREATMENT:  assessment   PATIENT EDUCATION: Education details: assessment findings, PT scope of practice Person educated: Patient and Spouse Education method: Explanation Education comprehension: verbalized understanding     GOALS: Goals reviewed with patient? Yes  SHORT TERM GOALS: Target date: 01/13/2023      Patient will perform Advanced HEP with family/caregiver supervision for improved strength, balance, transfers, and gait  Baseline: indep with initial HEP Goal status: INITIAL  2.  Demo improved safety and ability to access home environment ambulating grass and uneven surfaces w/ SBA and least restrictive AD Baseline: CGA-min A grass/slopes/gravel Goal status: IN PROGRESS   4. Demonstrate improved gait speed to 1.8 ft/sec with right AFO and least restrictive AD to improve efficiency of gait  Baseline:  0.68 ft/sec at eval; (09/20/22)  1.59 ft/sec w/ platform RW and AFO; (12/02/22) 1.4 ft/sec w/ WBQC and AFO  Goal status: IN PROGRESS  5. Improve mobility and reduce level of assist performing floor to stand transfers w/ SBA  Baseline: max A  Goal status: IN PROGRESS  LONG TERM GOALS: Target date: 02/24/2023    Demonstrate improved functional status and quality of life meeting FOTO predicted outcomes (59% predicted) Baseline: 47% Goal status:IN PROGRESS  2.  Demonstrate modified independent ambulation x 300 ft uneven surfaces using least restrictive AD in order to improve safety with home environment Baseline: CGA-min A w/ WBQC Goal status: IN PROGRESS  3.  Manifest improved safety with gait per time of 18 sec TUG test using least restrictive AD Baseline: 34.75 sec w/ HW; (09/20/22) 30.69 w/ HW and right AFO; (12/02/22) 23 sec w/ WBQC Goal status: IN PROGRESS   4. Stair ambulation x 15 steps with set-up assist to improve safety in home environment and reduce burden of care  Baseline: SBA with single HR, using AFO; (12/02/22) supervision  Goal status: On-going      ASSESSMENT:  CLINICAL IMPRESSION: Activities structured to facilitate RLE posterior engagement/recruitment with hamstring and glutes via various modalities of tapping, deep pressure, and quick stretch facilitation.  Good glute recruitment with posterior heel squeeze. Facilitation of hip extension/abduction moment to enable RLE weight acceptance in retrowalking. Pt caregiver present for session for training in prone and tall kneeling activities for home performance.   OBJECTIVE IMPAIRMENTS Abnormal gait, decreased activity tolerance, decreased balance, decreased coordination, decreased knowledge of use of DME, decreased mobility, difficulty walking, decreased strength, impaired sensation, impaired tone, impaired UE functional use, and improper body mechanics.   ACTIVITY LIMITATIONS carrying, lifting, bending, standing, squatting, stairs, transfers, bathing,  toileting, dressing, reach over head, and locomotion level  PARTICIPATION LIMITATIONS: meal prep, cleaning, laundry, interpersonal relationship, driving, shopping, community activity, occupation, yard work, and leisure activities  [place] Time since onset of injury/illness/exacerbation are also affecting patient's functional outcome.   REHAB POTENTIAL: Excellent  CLINICAL DECISION MAKING: Evolving/moderate complexity  EVALUATION COMPLEXITY: Moderate  PLAN: PT FREQUENCY: 1-2/wk  PT DURATION: 12 weeks  PLANNED INTERVENTIONS: Therapeutic exercises, Therapeutic activity, Neuromuscular re-education, Balance training, Gait training, Patient/Family education, Self Care, Joint mobilization, Stair training, Vestibular training, Canalith repositioning, Orthotic/Fit training, DME instructions, Aquatic Therapy, Dry Needling, Electrical stimulation, Spinal mobilization, Cryotherapy, Moist heat, Splintting, Taping, and Manual therapy  PLAN FOR NEXT SESSION: Check additions to HEP and try to add retro gait to HEP.  Outdoor quad cane gait training including ramps, curbs, trial SPC.  Hip ABD strengthening/flexion/extensors also, half kneeling with folded gym mat to progress floor to stand    3:42 PM, 01/04/23 M. Sherlyn Lees, PT, DPT Physical Therapist- [place] Office Number: [phone]

## 2023-01-06 ENCOUNTER — Inpatient Hospital Stay: Payer: 59 | Attending: Hematology | Admitting: Hematology

## 2023-01-06 VITALS — BP 112/74 | HR 78 | Temp 97.8°F | Resp 18 | Ht 68.0 in | Wt 182.5 lb

## 2023-01-06 DIAGNOSIS — I639 Cerebral infarction, unspecified: Secondary | ICD-10-CM | POA: Diagnosis not present

## 2023-01-06 DIAGNOSIS — Z7982 Long term (current) use of aspirin: Secondary | ICD-10-CM | POA: Diagnosis not present

## 2023-01-06 DIAGNOSIS — Z7901 Long term (current) use of anticoagulants: Secondary | ICD-10-CM | POA: Insufficient documentation

## 2023-01-06 DIAGNOSIS — Z7902 Long term (current) use of antithrombotics/antiplatelets: Secondary | ICD-10-CM | POA: Diagnosis not present

## 2023-01-06 DIAGNOSIS — I824Z1 Acute embolism and thrombosis of unspecified deep veins of right distal lower extremity: Secondary | ICD-10-CM

## 2023-01-06 NOTE — Progress Notes (Signed)
Gordon Fisher, [place] [address]   Clinic Day:  01/06/2023  Referring physician: [place] [facility]  Patient Care Team: Pcp, No as PCP - General Vickie Epley, MD as PCP - Electrophysiology (Cardiology) Derek Jack, MD as Medical Oncologist (Hematology)   ASSESSMENT & PLAN:   Assessment:  1.  Cryptogenic stroke: - Left MCA infarct due to left M1 occlusion, status post TNK and thrombectomy x 2, stent placement for left M2 dissection followed by stent reocclusion, etiology unclear on 02/14/2022 - 2D echo, EF 60-65%, interatrial septum not well-visualized. - TEE (02/15/2022):-Negative bubble study, no cardiac thrombus.  No evidence of atrial fibrillation or arrhythmias by loop recorder. - Started on aspirin 81 mg and Brilinta 90 mg twice daily, aspirin stopped when Eliquis was started for DVT.  Brilinta discontinued in October 2023 and he is currently on aspirin and Eliquis. - 25 pound weight loss since October 2023.  He stopped drinking soda and eating sweets.  2.  Provoked right leg distal DVT: - Doppler on 03/05/2022: Acute DVT involving the right gastrinomas veins, right peroneal veins - Doppler on 03/10/2022: Acute DVT involving right gastrinomas, right peroneal veins. - Doppler on 05/10/2022: Age-indeterminate DVT involving right peroneal vein.  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 - Currently on Eliquis twice daily.  3.  Social/family history: - Lives at home with his wife.  He worked in [employer], special forces.  He was stationed in [place] and also in the [place].  Exposure to burnt pits.  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 - No family history of DVT.  Sister had mild CVA at a young age.  Maternal grandmother had cancer.  Son has tuberosclerosis.   Plan:  1.  Provoked right leg distal DVT: -We reviewed blood work from 12/10/2022.  Lupus anticoagulant and  anticardiolipin antibodies were negative.  D-dimer was normal.  Factor V Leiden and prothrombin gene mutations were negative. - JAK2 V617F mutation was negative. - Ultrasound Doppler of the right leg: Negative for DVT. - Even though his clot has resolved, I have recommended continuing anticoagulation due to his continued decrease in the functional status from stroke. - If he needs to be on aspirin 81 mg daily for his stroke, I have no problem with it. - I will reevaluate him in 6 months with a D-dimer.  Will also check antibeta 2 glycoprotein 1 antibody at that time.   Orders Placed This Encounter  Procedures   D-dimer, quantitative    Standing Status:   Future    Standing Expiration Date:   01/06/2024   Beta-2-glycoprotein i abs, IgG/M/A    Standing Status:   Future    Standing Expiration Date:   01/06/2024     I,Alexis Herring,acting as a scribe for Derek Jack, MD.,have documented all relevant documentation on the behalf of Derek Jack, MD,as directed by  Derek Jack, MD while in the presence of Derek Jack, MD.  I, Derek Jack MD, have reviewed the above documentation for accuracy and completeness, and I agree with the above.    Derek Jack, MD   3/7/20244:26 PM  CHIEF COMPLAINT/PURPOSE OF CONSULT:   Diagnosis: DVT/stroke  Current Therapy:  Eliquis  HISTORY OF PRESENT ILLNESS:   Gordon Fisher is a 62 y.o. male presenting to clinic today for follow up with history of DVT and stroke. He was last seen by me on 12/10/22 for consult.  Today,  he states that he is doing well overall. His appetite level is at 100%. His energy level is at 75%.  Patient's wife reports that he has been losing few pounds at each doctor's visit.  Patient reports that he stopped drinking sugary sodas and eating sweets.   PAST MEDICAL HISTORY:   Past Medical History: Past Medical History:  Diagnosis Date   Stroke [facility])     Surgical History: Past Surgical  History:  Procedure Laterality Date   BUBBLE STUDY  02/15/2022   Procedure: BUBBLE STUDY;  Surgeon: Werner Lean, MD;  Location: [facility];  Service: Cardiovascular;;   IR CT HEAD LTD  02/11/2022   IR CT HEAD LTD  02/11/2022   IR CT HEAD LTD  02/11/2022   IR PERCUTANEOUS ART THROMBECTOMY/INFUSION INTRACRANIAL INC DIAG ANGIO  02/11/2022   IR PERCUTANEOUS ART THROMBECTOMY/INFUSION INTRACRANIAL INC DIAG ANGIO  02/11/2022   IR US GUIDE VASC ACCESS RIGHT  02/11/2022   IR US GUIDE VASC ACCESS RIGHT  02/11/2022   RADIOLOGY WITH ANESTHESIA N/A 02/11/2022   Procedure: IR WITH ANESTHESIA;  Surgeon: Radiologist, Medication, MD;  Location: [facility];  Service: Radiology;  Laterality: N/A;   RADIOLOGY WITH ANESTHESIA N/A 02/11/2022   Procedure: RADIOLOGY WITH ANESTHESIA;  Surgeon: Radiologist, Medication, MD;  Location: [facility];  Service: Radiology;  Laterality: N/A;   TEE WITHOUT CARDIOVERSION N/A 02/15/2022   Procedure: TRANSESOPHAGEAL ECHOCARDIOGRAM (TEE);  Surgeon: Werner Lean, MD;  Location: [facility] ENDOSCOPY;  Service: Cardiovascular;  Laterality: N/A;    Social History: Social History   Socioeconomic History   Marital status: Married    Spouse name: Gordon Fisher   Number of children: 2   Years of education: Not on file   Highest education level: Associate degree: occupational, Hotel manager, or vocational program  Occupational History   Not on file  Tobacco Use   Smoking status: Never    Passive exposure: Never   Smokeless tobacco: Never  Vaping Use   Vaping Use: Never used  Substance and Sexual Activity   Alcohol use: Never   Drug use: Never   Sexual activity: Never  Other Topics Concern   Not on file  Social History Narrative   04/27/22 lives with wife   Social Determinants of Health   Financial Resource Strain: Not on file  Food Insecurity: No Food Insecurity (12/10/2022)   Hunger Vital Sign    Worried About Running Out of Food in the Last Year: Never true    Ran Out of Food in  the Last Year: Never true  Transportation Needs: No Transportation Needs (12/10/2022)   PRAPARE - Hydrologist (Medical): No    Lack of Transportation (Non-Medical): No  Physical Activity: Not on file  Stress: Not on file  Social Connections: Not on file  Intimate Partner Violence: Not At Risk (12/10/2022)   Humiliation, Afraid, Rape, and Kick questionnaire    Fear of Current or Ex-Partner: No    Emotionally Abused: No    Physically Abused: No    Sexually Abused: No    Family History: Family History  Problem Relation Age of Onset   Thyroid disease Mother     Current Medications:  Current Outpatient Medications:    apixaban (ELIQUIS) 5 MG TABS tablet, TAKE ONE (1) TABLET BY MOUTH TWO (2) TIMES DAILY, Disp: 60 tablet, Rfl: 0   atorvastatin (LIPITOR) 40 MG tablet, Take 1 tablet (40 mg total) by mouth every evening., Disp: 30 tablet, Rfl: 1  Current  Facility-Administered Medications:    diclofenac Sodium (VOLTAREN) 1 % topical gel 2 g, 2 g, Topical, QID, Jennye Boroughs, MD   Allergies: No Known Allergies  REVIEW OF SYSTEMS:   Review of Systems  Constitutional:  Negative for chills, fatigue and fever.  HENT:   Negative for lump/mass, mouth sores, nosebleeds, sore throat and trouble swallowing.   Eyes:  Negative for eye problems.  Respiratory:  Negative for cough and shortness of breath.   Cardiovascular:  Negative for chest pain, leg swelling and palpitations.  Gastrointestinal:  Negative for abdominal pain, constipation, diarrhea, nausea and vomiting.  Genitourinary:  Negative for bladder incontinence, difficulty urinating, dysuria, frequency, hematuria and nocturia.   Musculoskeletal:  Negative for arthralgias, back pain, flank pain, myalgias and neck pain.  Skin:  Negative for itching and rash.  Neurological:  Positive for numbness (Right half of the body). Negative for dizziness and headaches.  Hematological:  Does not bruise/bleed easily.   Psychiatric/Behavioral:  Negative for depression, sleep disturbance and suicidal ideas. The patient is not nervous/anxious.   All other systems reviewed and are negative.    VITALS:   Blood pressure 112/74, pulse 78, temperature 97.8 F (36.6 C), temperature source Oral, resp. rate 18, height '5\' 8"'$  (1.727 m), weight 182 lb 8 oz (82.8 kg), SpO2 98 %.  Wt Readings from Last 3 Encounters:  01/06/23 182 lb 8 oz (82.8 kg)  12/10/22 159 lb 3.2 oz (72.2 kg)  11/08/22 174 lb (78.9 kg)    Body mass index is 27.75 kg/m.  PHYSICAL EXAM:   Physical Exam Vitals and nursing note reviewed. Exam conducted with a chaperone present.  Constitutional:      Appearance: Normal appearance.  Cardiovascular:     Rate and Rhythm: Normal rate and regular rhythm.     Pulses: Normal pulses.     Heart sounds: Normal heart sounds.  Pulmonary:     Effort: Pulmonary effort is normal.     Breath sounds: Normal breath sounds.  Abdominal:     Palpations: Abdomen is soft. There is no hepatomegaly, splenomegaly or mass.     Tenderness: There is no abdominal tenderness.  Musculoskeletal:     Right lower leg: No edema.     Left lower leg: No edema.  Lymphadenopathy:     Cervical: No cervical adenopathy.     Right cervical: No superficial, deep or posterior cervical adenopathy.    Left cervical: No superficial, deep or posterior cervical adenopathy.     Upper Body:     Right upper body: No supraclavicular or axillary adenopathy.     Left upper body: No supraclavicular or axillary adenopathy.  Neurological:     General: No focal deficit present.     Mental Status: He is alert and oriented to person, place, and time.  Psychiatric:        Mood and Affect: Mood normal.        Behavior: Behavior normal.     LABS:      Latest Ref Rng & Units 03/15/2022    6:31 AM 03/08/2022    5:28 AM 03/05/2022    5:56 PM  CBC  WBC 4.0 - 10.5 K/uL 7.1  5.7  15.4   Hemoglobin 13.0 - 17.0 g/dL 15.3  14.5  15.6   Hematocrit  39.0 - 52.0 % 43.3  41.7  44.4   Platelets 150 - 400 K/uL 230  184  205       Latest Ref Rng & Units 03/15/2022  6:31 AM 03/08/2022    5:28 AM 03/01/2022    6:01 AM  CMP  Glucose 70 - 99 mg/dL 107  125  112   BUN 6 - 20 mg/dL '13  12  18   '$ Creatinine 0.61 - 1.24 mg/dL 1.12  1.04  1.08   Sodium 135 - 145 mmol/L 135  135  137   Potassium 3.5 - 5.1 mmol/L 3.7  3.7  3.7   Chloride 98 - 111 mmol/L 105  106  105   CO2 22 - 32 mmol/L '23  23  24   '$ Calcium 8.9 - 10.3 mg/dL 8.9  8.9  8.8      No results found for: "CEA1", "CEA" / No results found for: "CEA1", "CEA" No results found for: "PSA1" No results found for: "[id number]" No results found for: "CAN125"  No results found for: "TOTALPROTELP", "ALBUMINELP", "A1GS", "A2GS", "BETS", "BETA2SER", "GAMS", "MSPIKE", "SPEI" No results found for: "TIBC", "FERRITIN", "IRONPCTSAT" No results found for: "LDH"   STUDIES:   US Venous Img Lower Unilateral Right  Result Date: 12/20/2022 CLINICAL DATA:  History of right lower extremity DVT. Follow-up evaluation. EXAM: RIGHT LOWER EXTREMITY VENOUS DOPPLER ULTRASOUND TECHNIQUE: Gray-scale sonography with compression, as well as color and duplex ultrasound, were performed to evaluate the deep venous system(s) from the level of the common femoral vein through the popliteal and proximal calf veins. COMPARISON:  None Available. FINDINGS: VENOUS Normal compressibility of the common femoral, superficial femoral, and popliteal veins, as well as the visualized calf veins. Visualized portions of profunda femoral vein and great saphenous vein unremarkable. No filling defects to suggest DVT on grayscale or color Doppler imaging. Doppler waveforms show normal direction of venous flow, normal respiratory plasticity and response to augmentation. Limited views of the contralateral common femoral vein are unremarkable. OTHER None. Limitations: none IMPRESSION: Negative.  No evidence of DVT. Electronically Signed   By: Jacqulynn Cadet M.D.   On: 12/20/2022 15:49   CUP PACEART REMOTE DEVICE CHECK  Result Date: 12/13/2022 ILR summary report received. Battery status OK. Normal device function. No new symptom, tachy, brady, or pause episodes. No new AF episodes. Monthly summary reports and ROV/PRN LA

## 2023-01-06 NOTE — Patient Instructions (Addendum)
[facility]  Discharge Instructions  You were seen and examined today by Dr. Delton Coombes.  Dr. Delton Coombes discussed your most recent lab work which revealed that everything is good and normal.  Dr. Delton Coombes recommends continuing the Eliquis until you gain your mobility back. He will reassess the situation at your next visit in 6 months.  You can drink ensure or boost once daily to help with weight gain.  Follow-up as scheduled in 6 months with labs prior.    Thank you for choosing [facility] to provide your oncology and hematology care.   To afford each patient quality time with our provider, please arrive at least 15 minutes before your scheduled appointment time. You may need to reschedule your appointment if you arrive late (10 or more minutes). Arriving late affects you and other patients whose appointments are after yours.  Also, if you miss three or more appointments without notifying the office, you may be dismissed from the clinic at the provider's discretion.    Again, thank you for choosing [facility].  Our hope is that these requests will decrease the amount of time that you wait before being seen by our physicians.   If you have a lab appointment with the [facility] please come in thru the Main Entrance and check in at the main information desk.           _____________________________________________________________  Should you have questions after your visit to [facility], please contact our office at [phone] and follow the prompts.  Our office hours are 8:00 a.m. to 4:30 p.m. Monday - Thursday and 8:00 a.m. to 2:30 p.m. Friday.  Please note that voicemails left after 4:00 p.m. may not be returned until the following business day.  We are closed weekends and all major holidays.  You do have access to a nurse 24-7, just call the main number to the clinic [phone] and do not press  any options, hold on the line and a nurse will answer the phone.    For prescription refill requests, have your pharmacy contact our office and allow 72 hours.    Masks are optional in the cancer centers. If you would like for your care team to wear a mask while they are taking care of you, please let them know. You may have one support person who is at least 61 years old accompany you for your appointments.

## 2023-01-11 ENCOUNTER — Ambulatory Visit: Payer: 59

## 2023-01-11 ENCOUNTER — Ambulatory Visit: Payer: 59 | Admitting: Occupational Therapy

## 2023-01-11 DIAGNOSIS — I69351 Hemiplegia and hemiparesis following cerebral infarction affecting right dominant side: Secondary | ICD-10-CM

## 2023-01-11 DIAGNOSIS — R208 Other disturbances of skin sensation: Secondary | ICD-10-CM

## 2023-01-11 DIAGNOSIS — R262 Difficulty in walking, not elsewhere classified: Secondary | ICD-10-CM

## 2023-01-11 DIAGNOSIS — R482 Apraxia: Secondary | ICD-10-CM

## 2023-01-11 DIAGNOSIS — R2689 Other abnormalities of gait and mobility: Secondary | ICD-10-CM

## 2023-01-11 DIAGNOSIS — R4701 Aphasia: Secondary | ICD-10-CM | POA: Diagnosis not present

## 2023-01-11 DIAGNOSIS — M6281 Muscle weakness (generalized): Secondary | ICD-10-CM

## 2023-01-11 DIAGNOSIS — R278 Other lack of coordination: Secondary | ICD-10-CM

## 2023-01-11 DIAGNOSIS — R2681 Unsteadiness on feet: Secondary | ICD-10-CM

## 2023-01-11 NOTE — Therapy (Incomplete)
OUTPATIENT OCCUPATIONAL THERAPY NEURO TREATMENT NOTE  Patient Name: Gordon Fisher MRN: [id number] DOB:1961/09/06, 62 y.o., male Today's Date: 01/11/2023  PCP: none on file REFERRING PROVIDER: Jennye Boroughs, MD                   Past Medical History:  Diagnosis Date   Stroke [facility])    Past Surgical History:  Procedure Laterality Date   BUBBLE STUDY  02/15/2022   Procedure: BUBBLE STUDY;  Surgeon: Werner Lean, MD;  Location: [place];  Service: Cardiovascular;;   IR CT HEAD LTD  02/11/2022   IR CT HEAD LTD  02/11/2022   IR CT HEAD LTD  02/11/2022   IR PERCUTANEOUS ART THROMBECTOMY/INFUSION INTRACRANIAL INC DIAG ANGIO  02/11/2022   IR PERCUTANEOUS ART THROMBECTOMY/INFUSION INTRACRANIAL INC DIAG ANGIO  02/11/2022   IR US GUIDE VASC ACCESS RIGHT  02/11/2022   IR US GUIDE VASC ACCESS RIGHT  02/11/2022   RADIOLOGY WITH ANESTHESIA N/A 02/11/2022   Procedure: IR WITH ANESTHESIA;  Surgeon: Radiologist, Medication, MD;  Location: [place];  Service: Radiology;  Laterality: N/A;   RADIOLOGY WITH ANESTHESIA N/A 02/11/2022   Procedure: RADIOLOGY WITH ANESTHESIA;  Surgeon: Radiologist, Medication, MD;  Location: [place];  Service: Radiology;  Laterality: N/A;   TEE WITHOUT CARDIOVERSION N/A 02/15/2022   Procedure: TRANSESOPHAGEAL ECHOCARDIOGRAM (TEE);  Surgeon: Werner Lean, MD;  Location: [facility] ENDOSCOPY;  Service: Cardiovascular;  Laterality: N/A;   Patient Active Problem List   Diagnosis Date Noted   Hypocalcemia 03/22/2022   DVT, lower extremity, distal ([place]) 03/22/2022   Obesity 03/22/2022   Elevated LDL cholesterol level 03/22/2022   Acute ischemic left middle cerebral artery (MCA) stroke ([place]) 02/18/2022   Cerebrovascular accident (CVA) due to occlusion of left middle cerebral artery ([place]) 02/11/2022    ONSET DATE: referral 07/15/22 (CVA 02/11/22)  REFERRING DIAG: I63.9 (ICD-10-CM) - Cerebrovascular accident (CVA), unspecified mechanism ([place]) R13.10  (ICD-10-CM) - Dysphagia, unspecified type   THERAPY DIAG:  No diagnosis found.  Rationale for Evaluation and Treatment Rehabilitation  SUBJECTIVE:   SUBJECTIVE STATEMENT: Pt's sister-in-law present and reports noticing increased grimacing with certain movements of RUE. 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 No  FALLS: Has patient fallen in last 6 months? Yes. Number of falls 1  PLOF: Independent, Independent with basic ADLs, and Independent with gait  PATIENT GOALS: continue to make gains, independence with getting dressed   OBJECTIVE:   HAND DOMINANCE: Right  FUNCTIONAL OUTCOME MEASURES: FOTO: 11  UPPER EXTREMITY ROM    Active ROM: Pt with no AROM during evaluation.  Pt was able to elicit min shoulder elevation and slight gross grasp when cued and provided increased time to initiate movement.   09/22/22: Pt still with no AROM in upright position, is able to elicit slight internal rotation and mod elbow flexion/extension in gravity assisted position. [id number]: Pt eliciting trace shoulder flexion, 44* elbow flexion, -16* elbow extension, and able to elicit thumb extension with shoulder internal rotation.  Pt demonstrating ~30% of scapular retraction and elevation with intermitent tactile cues.     Passive ROM Right Eval - 9/25 Right 11/23/22  Shoulder flexion 100 120  Shoulder abduction  95  Shoulder adduction    Shoulder extension    Shoulder internal rotation WNL Unable to tolerate reaching past torso towards back pocket  Shoulder  external rotation onset of pain >5-10* from neutral Onset of pain > 30*  Middle trapezius    Lower trapezius    Elbow flexion WNL WNL  Elbow extension WNL WNL  Wrist flexion WNL WNL  Wrist extension WNL WNL  Wrist ulnar deviation    Wrist radial deviation    Wrist  pronation    Wrist supination    (Blank rows = not tested)  HAND FUNCTION: 11/23/22: loose gross grasp with increased time for initiation, unable to release grasp  SENSATION: Difficult to assess due to expressive aphasia  MUSCLE TONE: RUE: Mild and Hypertonic  ------------------------------------------------------------------------------------------------------------------------------------------------------------ (objective measures above completed at initial evaluation unless otherwise dated)  TODAY'S TREATMENT: 01/04/23 NMR: engaged in sidelying shoulder flexion, extension, and elbow flexion and extension. OT utilizing sidelying to facilitate improved scapular protraction and retraction and increased shoulder flexion without gravity impacting mobility.  Attempted to engage in ROM on top of surface to allow for increased facilitation, however pt benefiting from tactile cues and arm positioned on therapist's forearm to further grade mobility and pressure.  Pt demonstrating improved shoulder activation with tactile cues, verbal cues to "push" and "pull", and support to forearm as pt frequently eliciting shoulder internal rotation limiting ROM.  Elbow flexion/extension: Pt demonstrating increased tone in elbow about 90* flexion.  OT able to range arm through positioning with full flexion and extension to ~10* from neutral.  Pt demonstrating flexion from nearly straight to 90* and demonstrating extension from full flexion to ~100* flexion before tone setting in.  OT educated pt's spouse and sister in law on hand placement to facilitate PROM/stretching to elbow while ensuring alignment and no stress on shoulder. E-stim: attempted to utilize NMES to aid in shoulder flexion in sidelying, however minimal activation noted with stimulation, therefore resumed hand over hand facilitation from OT.     12/28/22 NMR: engaged in bimanual task with towel slides with BUE, OT facilitating increased ROM on RUE  and providing increased support to compensate for decreased shoulder activation.  Transitioned to utilizing walking poles while in sitting with focus on pushing BUE forward and backward in symmetry, OT providing support at R wrist and under elbow to decrease impact of gravity as well as support for shoulder girdle.  Increased challenge to pt then completing asymmetrical movement alternating forward/backwards movements with poles.  Pt initially requiring increased assist, fading to support for positioning with pt demonstrating ~50% of movement on R compared to full range on L. Supine shoulder abduction with and without dowel.  OT providing hand over hand to facilitate R hand placement to allow for proper ROM and technique.  Pt demonstrating difficulty achieving supinated position and difficulty maintaining grasp on dowel.  Therefore OT modified task to completing without dowel to demonstrate proper technique of shoulder abduction. Pt continues to require mod multimodal cues and intermittent tactile cues to complete ROM>    12/02/22 NMR: engaged in rolling large therapy ball with use of hand over hand to facilitate increased ROM and activation of RUE.  Completed forward/backward and side to side rolling with focus on increased ROM.  OT then challenged pt to complete with only RUE.  Pt able to complete forward backward rolling of large therapy ball with intermittent use of trunk to compensate for decreased shoulder activation.  Transitioned to rolling small ball on thigh.  Pt able to initiate and control movement to allow rolling of ball forward/backward and side to side without knocking ball off of thigh.  Educated on functional carryover  to washing thighs in shower.  Discussed use of wash mitt to allow functional use of arm without needing functional grasp. Horizontal abduction/adduction: engaged in transitioning hand from mat table at side <> thigh with focus on shoulder elevation and horizontal  abduction/adduction.  Pt able to complete with increased effort and initial overshooting however improved with repetition.  Towel slides on table top with focus on horizontal abduction/adduction.  Pt continues to require assistance for abduction, however able to complete adduction against mod resistance. AE: pt still lacking functional grasp and could benefit from use of adaptive equipment to accommodate for decreased grasp, however still lacks sufficient shoulder movement against gravity to functionally utilize AE at this time.  Will continue to assess and recommend as appropriate to increase functional use/incorporation of RUE into tasks.  PATIENT EDUCATION: Ongoing condition-specific education related to therapeutic interventions completed this session Person educated: Patient and Spouse Education method: Explanation, Demonstration, Tactile cues, Verbal cues, and Handouts Education comprehension: verbalized understanding and needs further education   HOME EXERCISE PROGRAM: Written exercises of shoulder and elbow in supine with cues for hand placement and facilitation.   Self-ROM handout from [facility] lab  Access Code: [email or web address] URL: [email or web address] Date: 12/28/2022 Prepared by: [facility]  Exercises - Supine Shoulder Flexion AAROM with Hands Clasped  - 1 x daily - 1 sets - 10 reps - Supine Shoulder Abduction PROM with Caregiver  - 1 x daily - 10 reps - Supine Shoulder Abduction AAROM with Dowel  - 1 x daily - 3 x weekly - 10 reps - Seated Elbow Extension and Shoulder External Rotation AAROM at Table with Towel  - 1 x daily - 1 sets - 10 reps - Finger Extension with Wrist Extension Caregiver PROM  - 1 x daily - 1 sets - 10 reps - Seated Dowel Chest Press  - 3 x weekly - 1-2 sets - 10 reps - Seated Shoulder Flexion with Dowel to 90  - 3 x weekly - 1-2 sets - 10 reps  GOALS: Goals reviewed with patient? Yes  SHORT TERM GOALS:    12/24/22  Pt and spouse will be independent with advanced PROM and AAROM HEP for shoulder stability and shoulder and elbow ROM. Baseline: Goal status: MET - 12/28/22  2.  Pt and spouse will verbalize understanding of AE and/or task modifications as needed for fastening shoes.  Baseline:  Goal status: IN PROGRESS   LONG TERM GOALS: Target date 01/21/23  Patient will be able to incorporate RUE into UB/LB dressing task at stabilizer to gross assist level. Baseline: Goal status: IN PROGRESS  2.   Pt will utilize RUE as gross assist during grooming tasks (such as washing hands and face). Baseline:  Goal status: IN PROGRESS  3.   Pt and spouse will verbalize and/or  demonstrate understanding of advanced treatment strategies and implementation at home (NMES, WB) to facilitate increased motor recovery.  Baseline:  Goal status: IN PROGRESS  4.   Pt will demonstrate ability to utilize BUE during mobility tasks to pushup from chair with BUE on arm rests.  Baseline:  Goal status: IN PROGRESS  5.   Pt will demonstrate ability to don/doff footwear (to include socks and shoes) with supervision and use of AE PRN.  Baseline:  Goal status: Met - 01/11/23  ASSESSMENT:  CLINICAL IMPRESSION: Treatment session with focus on NMR of RUE.  Pt demonstrating increased flexor tone in R elbow, however able to range through nearly  full ROM.  Pt demonstrating improved activation of shoulder flexion and extension in sidelying with multimodal cues to increase activation.    PERFORMANCE DEFICITS in functional skills including ADLs, IADLs, coordination, sensation, tone, ROM, strength, pain, flexibility, FMC, GMC, mobility, balance, body mechanics, vision, and UE functional use and psychosocial skills including environmental adaptation and routines and behaviors.   IMPAIRMENTS are limiting patient from ADLs and IADLs.   COMORBIDITIES may have co-morbidities  that affects occupational performance. Patient will  benefit from skilled OT to address above impairments and improve overall function.   PLAN: OT FREQUENCY: 1-2x/week  OT DURATION: 8 weeks  PLANNED INTERVENTIONS: self care/ADL training, therapeutic exercise, therapeutic activity, neuromuscular re-education, manual therapy, passive range of motion, balance training, functional mobility training, splinting, electrical stimulation, compression bandaging, moist heat, cryotherapy, patient/family education, visual/perceptual remediation/compensation, psychosocial skills training, energy conservation, coping strategies training, and DME and/or AE instructions  RECOMMENDED OTHER SERVICES: N/A  CONSULTED AND AGREED WITH PLAN OF CARE: Patient and family member/caregiver  PLAN FOR NEXT SESSION: Continue w/ scapular stability: shoulder shrugs, rolls, and scapular retraction; WB and dynamic standing, supine and sidelying NMR.  Increased focus on incorporation of RUE into functional tasks.  E-stim as cleared by MD.   Simonne Come, OTR/L 01/11/2023, 2:07 PM

## 2023-01-11 NOTE — Therapy (Signed)
OUTPATIENT OCCUPATIONAL THERAPY NEURO TREATMENT NOTE  Patient Name: Gordon Fisher MRN: [id number] DOB:01/17/1961, 62 y.o., male Today's Date: 01/11/2023  PCP: none on file REFERRING PROVIDER: Jennye Boroughs, MD    OT End of Session - 01/11/23 1509     Visit Number 23    Number of Visits 30    Date for OT Re-Evaluation 01/21/23    Authorization Type [employer]    Authorization Time Period VL: 60 combined (OT/PT/ST)    Authorization - Number of Visits 20    OT Start Time 1405    OT Stop Time 1448    OT Time Calculation (min) 43 min    Activity Tolerance Patient tolerated treatment well    Behavior During Therapy WFL for tasks assessed/performed                          Past Medical History:  Diagnosis Date   Stroke [facility])    Past Surgical History:  Procedure Laterality Date   BUBBLE STUDY  02/15/2022   Procedure: BUBBLE STUDY;  Surgeon: Werner Lean, MD;  Location: [place];  Service: Cardiovascular;;   IR CT HEAD LTD  02/11/2022   IR CT HEAD LTD  02/11/2022   IR CT HEAD LTD  02/11/2022   IR PERCUTANEOUS ART THROMBECTOMY/INFUSION INTRACRANIAL INC DIAG ANGIO  02/11/2022   IR PERCUTANEOUS ART THROMBECTOMY/INFUSION INTRACRANIAL INC DIAG ANGIO  02/11/2022   IR US GUIDE VASC ACCESS RIGHT  02/11/2022   IR US GUIDE VASC ACCESS RIGHT  02/11/2022   RADIOLOGY WITH ANESTHESIA N/A 02/11/2022   Procedure: IR WITH ANESTHESIA;  Surgeon: Radiologist, Medication, MD;  Location: [place];  Service: Radiology;  Laterality: N/A;   RADIOLOGY WITH ANESTHESIA N/A 02/11/2022   Procedure: RADIOLOGY WITH ANESTHESIA;  Surgeon: Radiologist, Medication, MD;  Location: [place];  Service: Radiology;  Laterality: N/A;   TEE WITHOUT CARDIOVERSION N/A 02/15/2022   Procedure: TRANSESOPHAGEAL ECHOCARDIOGRAM (TEE);  Surgeon: Werner Lean, MD;  Location: [facility] ENDOSCOPY;  Service: Cardiovascular;  Laterality: N/A;   Patient Active Problem List   Diagnosis Date Noted    Hypocalcemia 03/22/2022   DVT, lower extremity, distal ([place]) 03/22/2022   Obesity 03/22/2022   Elevated LDL cholesterol level 03/22/2022   Acute ischemic left middle cerebral artery (MCA) stroke ([place]) 02/18/2022   Cerebrovascular accident (CVA) due to occlusion of left middle cerebral artery ([place]) 02/11/2022    ONSET DATE: referral 07/15/22 (CVA 02/11/22)  REFERRING DIAG: I63.9 (ICD-10-CM) - Cerebrovascular accident (CVA), unspecified mechanism ([place]) R13.10 (ICD-10-CM) - Dysphagia, unspecified type   THERAPY DIAG:  Hemiplegia and hemiparesis following cerebral infarction affecting right dominant side (HCC)  Other lack of coordination  Other disturbances of skin sensation  Muscle weakness (generalized)  Rationale for Evaluation and Treatment Rehabilitation  SUBJECTIVE:   SUBJECTIVE STATEMENT: Pt's spouse stating that pt is getting dressed more independently, however is still not utilizing RUE at any assist. 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 No  FALLS: Has patient fallen in last 6 months? Yes. Number of falls 1  PLOF: Independent, Independent with basic ADLs, and Independent with gait  PATIENT GOALS: continue to make gains, independence with getting dressed   OBJECTIVE:   HAND DOMINANCE: Right  FUNCTIONAL OUTCOME MEASURES: FOTO: 11  UPPER EXTREMITY ROM    Active ROM: Pt  with no AROM during evaluation.  Pt was able to elicit min shoulder elevation and slight gross grasp when cued and provided increased time to initiate movement.   09/22/22: Pt still with no AROM in upright position, is able to elicit slight internal rotation and mod elbow flexion/extension in gravity assisted position. [id number]: Pt eliciting trace shoulder flexion, 44* elbow flexion, -16* elbow extension, and able to elicit thumb  extension with shoulder internal rotation.  Pt demonstrating ~30% of scapular retraction and elevation with intermitent tactile cues.     Passive ROM Right Eval - 9/25 Right 11/23/22  Shoulder flexion 100 120  Shoulder abduction  95  Shoulder adduction    Shoulder extension    Shoulder internal rotation WNL Unable to tolerate reaching past torso towards back pocket  Shoulder external rotation onset of pain >5-10* from neutral Onset of pain > 30*  Middle trapezius    Lower trapezius    Elbow flexion WNL WNL  Elbow extension WNL WNL  Wrist flexion WNL WNL  Wrist extension WNL WNL  Wrist ulnar deviation    Wrist radial deviation    Wrist pronation    Wrist supination    (Blank rows = not tested)  HAND FUNCTION: 11/23/22: loose gross grasp with increased time for initiation, unable to release grasp  SENSATION: Difficult to assess due to expressive aphasia  MUSCLE TONE: RUE: Mild and Hypertonic  ------------------------------------------------------------------------------------------------------------------------------------------------------------ (objective measures above completed at initial evaluation unless otherwise dated)  TODAY'S TREATMENT: 01/11/23 NMR: engaged in WB through RUE with OT providing facilitation and positioning through hand and elbow to facilitate increased WB for NMR.  Engaged in reaching across midline with LUE to further facilitate weight shifting through RUE.  Transitioned to WB through elbow with focus on transitioning down into sidelying and then pushing up through RUE to facilitate increased WB and motor recovery.  UE ranger: OT providing facilitation and tapping and tricep for increased forward reach and elbow extension.  OT providing intermittent tactile cues under and behind elbow to decrease effects of gravity on activation of movement.  Pt demonstrating intermittent internal rotation with shoulder flexion, however with verbal cue pt able to correct.   Pt demonstrating increased elbow flexion/extension when providing verbal cue to "push" and "pull".   NMR: engaged in rolling ball forward/backward on leg with focus on motor control with elbow flexion/extension and shoulder flexion.  OT challenged pt to move ball on leg in circular pattern clockwise and counter clockwise.  Pt able to complete but with significant increased time and effort.    01/04/23 NMR: engaged in sidelying shoulder flexion, extension, and elbow flexion and extension. OT utilizing sidelying to facilitate improved scapular protraction and retraction and increased shoulder flexion without gravity impacting mobility.  Attempted to engage in ROM on top of surface to allow for increased facilitation, however pt benefiting from tactile cues and arm positioned on therapist's forearm to further grade mobility and pressure.  Pt demonstrating improved shoulder activation with tactile cues, verbal cues to "push" and "pull", and support to forearm as pt frequently eliciting shoulder internal rotation limiting ROM.  Elbow flexion/extension: Pt demonstrating increased tone in elbow about 90* flexion.  OT able to range arm through positioning with full flexion and extension to ~10* from neutral.  Pt demonstrating flexion from nearly straight to 90* and demonstrating extension from full flexion to ~100* flexion before tone setting in.  OT educated pt's spouse and sister in law on hand placement to facilitate PROM/stretching to elbow while  ensuring alignment and no stress on shoulder. E-stim: attempted to utilize NMES to aid in shoulder flexion in sidelying, however minimal activation noted with stimulation, therefore resumed hand over hand facilitation from OT.     12/28/22 NMR: engaged in bimanual task with towel slides with BUE, OT facilitating increased ROM on RUE and providing increased support to compensate for decreased shoulder activation.  Transitioned to utilizing walking poles while in  sitting with focus on pushing BUE forward and backward in symmetry, OT providing support at R wrist and under elbow to decrease impact of gravity as well as support for shoulder girdle.  Increased challenge to pt then completing asymmetrical movement alternating forward/backwards movements with poles.  Pt initially requiring increased assist, fading to support for positioning with pt demonstrating ~50% of movement on R compared to full range on L. Supine shoulder abduction with and without dowel.  OT providing hand over hand to facilitate R hand placement to allow for proper ROM and technique.  Pt demonstrating difficulty achieving supinated position and difficulty maintaining grasp on dowel.  Therefore OT modified task to completing without dowel to demonstrate proper technique of shoulder abduction. Pt continues to require mod multimodal cues and intermittent tactile cues to complete ROM>  PATIENT EDUCATION: Ongoing condition-specific education related to therapeutic interventions completed this session Person educated: Patient and Spouse Education method: Explanation, Demonstration, Tactile cues, Verbal cues, and Handouts Education comprehension: verbalized understanding and needs further education   HOME EXERCISE PROGRAM: Written exercises of shoulder and elbow in supine with cues for hand placement and facilitation.   Self-ROM handout from [facility] lab  Access Code: [email or web address] URL: [email or web address] Date: 12/28/2022 Prepared by: [facility]  Exercises - Supine Shoulder Flexion AAROM with Hands Clasped  - 1 x daily - 1 sets - 10 reps - Supine Shoulder Abduction PROM with Caregiver  - 1 x daily - 10 reps - Supine Shoulder Abduction AAROM with Dowel  - 1 x daily - 3 x weekly - 10 reps - Seated Elbow Extension and Shoulder External Rotation AAROM at Table with Towel  - 1 x daily - 1 sets - 10 reps - Finger Extension with Wrist Extension  Caregiver PROM  - 1 x daily - 1 sets - 10 reps - Seated Dowel Chest Press  - 3 x weekly - 1-2 sets - 10 reps - Seated Shoulder Flexion with Dowel to 90  - 3 x weekly - 1-2 sets - 10 reps  GOALS: Goals reviewed with patient? Yes  SHORT TERM GOALS:   12/24/22  Pt and spouse will be independent with advanced PROM and AAROM HEP for shoulder stability and shoulder and elbow ROM. Baseline: Goal status: MET - 12/28/22  2.  Pt and spouse will verbalize understanding of AE and/or task modifications as needed for fastening shoes.  Baseline:  Goal status: IN PROGRESS   LONG TERM GOALS: Target date 01/21/23  Patient will be able to incorporate RUE into UB/LB dressing task at stabilizer to gross assist level. Baseline: Goal status: IN PROGRESS  2.   Pt will utilize RUE as gross assist during grooming tasks (such as washing hands and face). Baseline:  Goal status: IN PROGRESS  3.   Pt and spouse will verbalize and/or  demonstrate understanding of advanced treatment strategies and implementation at home (NMES, WB) to facilitate increased motor recovery.  Baseline:  Goal status: IN PROGRESS  4.   Pt will demonstrate ability to utilize BUE during mobility  tasks to pushup from chair with BUE on arm rests.  Baseline:  Goal status: IN PROGRESS  5.   Pt will demonstrate ability to don/doff footwear (to include socks and shoes) with supervision and use of AE PRN.  Baseline:  Goal status: IN PROGRESS  ASSESSMENT:  CLINICAL IMPRESSION: Treatment session with focus on NMR of RUE.  Pt demonstrating increased flexor tone in R elbow, however able to range through nearly full ROM.  Pt demonstrating improved activation of elbow flexion and extension with verbal cues for "push" and "pull" during use of UE Ranger and intermittent tactile cues at triceps for further forward reach.  OT providing encouragement to pt and spouse to utilize RUE as gross assist during LB dressing with hooking RUE into pocket or  pant loop as pt with emerging shoulder and elbow movements and to attempt to put them into functional use.  PERFORMANCE DEFICITS in functional skills including ADLs, IADLs, coordination, sensation, tone, ROM, strength, pain, flexibility, FMC, GMC, mobility, balance, body mechanics, vision, and UE functional use and psychosocial skills including environmental adaptation and routines and behaviors.   IMPAIRMENTS are limiting patient from ADLs and IADLs.   COMORBIDITIES may have co-morbidities  that affects occupational performance. Patient will benefit from skilled OT to address above impairments and improve overall function.   PLAN: OT FREQUENCY: 1-2x/week  OT DURATION: 8 weeks  PLANNED INTERVENTIONS: self care/ADL training, therapeutic exercise, therapeutic activity, neuromuscular re-education, manual therapy, passive range of motion, balance training, functional mobility training, splinting, electrical stimulation, compression bandaging, moist heat, cryotherapy, patient/family education, visual/perceptual remediation/compensation, psychosocial skills training, energy conservation, coping strategies training, and DME and/or AE instructions  RECOMMENDED OTHER SERVICES: N/A  CONSULTED AND AGREED WITH PLAN OF CARE: Patient and family member/caregiver  PLAN FOR NEXT SESSION:  WB and dynamic standing, supine and sidelying NMR.  Increased focus on incorporation of RUE into functional tasks.  E-stim as cleared by MD.   Simonne Come, OTR/L 01/11/2023, 3:10 PM

## 2023-01-11 NOTE — Therapy (Signed)
OUTPATIENT PHYSICAL THERAPY NEURO TREATMENT   Patient Name: Gordon Fisher MRN: [id number] DOB:June 18, 1961, 62 y.o., male Today's Date: 01/11/2023   PCP: None listed REFERRING PROVIDER: Jennye Boroughs, MD       PT End of Session - 01/11/23 1455     Visit Number 25    Number of Visits 40    Date for PT Re-Evaluation 02/24/23    Authorization Type [employer] 2024-PT, OT, speech combined 62 visits    Authorization - Visit Number 6    Authorization - Number of Visits 30    PT Start Time [id number]    PT Stop Time 1530    PT Time Calculation (min) 45 min    Activity Tolerance Patient tolerated treatment well    Behavior During Therapy WFL for tasks assessed/performed                       Past Medical History:  Diagnosis Date   Stroke [facility])    Past Surgical History:  Procedure Laterality Date   BUBBLE STUDY  02/15/2022   Procedure: BUBBLE STUDY;  Surgeon: Werner Lean, MD;  Location: [place];  Service: Cardiovascular;;   IR CT HEAD LTD  02/11/2022   IR CT HEAD LTD  02/11/2022   IR CT HEAD LTD  02/11/2022   IR PERCUTANEOUS ART THROMBECTOMY/INFUSION INTRACRANIAL INC DIAG ANGIO  02/11/2022   IR PERCUTANEOUS ART THROMBECTOMY/INFUSION INTRACRANIAL INC DIAG ANGIO  02/11/2022   IR US GUIDE VASC ACCESS RIGHT  02/11/2022   IR US GUIDE VASC ACCESS RIGHT  02/11/2022   RADIOLOGY WITH ANESTHESIA N/A 02/11/2022   Procedure: IR WITH ANESTHESIA;  Surgeon: Radiologist, Medication, MD;  Location: [place];  Service: Radiology;  Laterality: N/A;   RADIOLOGY WITH ANESTHESIA N/A 02/11/2022   Procedure: RADIOLOGY WITH ANESTHESIA;  Surgeon: Radiologist, Medication, MD;  Location: [place];  Service: Radiology;  Laterality: N/A;   TEE WITHOUT CARDIOVERSION N/A 02/15/2022   Procedure: TRANSESOPHAGEAL ECHOCARDIOGRAM (TEE);  Surgeon: Werner Lean, MD;  Location: [facility] ENDOSCOPY;  Service: Cardiovascular;  Laterality: N/A;   Patient Active Problem List   Diagnosis Date Noted    Hypocalcemia 03/22/2022   DVT, lower extremity, distal ([place]) 03/22/2022   Obesity 03/22/2022   Elevated LDL cholesterol level 03/22/2022   Acute ischemic left middle cerebral artery (MCA) stroke ([place]) 02/18/2022   Cerebrovascular accident (CVA) due to occlusion of left middle cerebral artery ([place]) 02/11/2022    ONSET DATE: April 2023  REFERRING DIAG: I63.9 (ICD-10-CM) - Cerebrovascular accident (CVA), unspecified mechanism ([place]) R13.10 (ICD-10-CM) - Dysphagia, unspecified type   THERAPY DIAG:  Muscle weakness (generalized)  Unsteadiness on feet  Hemiplegia and hemiparesis following cerebral infarction affecting right dominant side (HCC)  Other abnormalities of gait and mobility  Difficulty in walking, not elsewhere classified  Rationale for Evaluation and Treatment Rehabilitation  SUBJECTIVE:  SUBJECTIVE STATEMENT:  Things going well.  Wife reports doing exercises at home.  Pt accompanied by: significant other  PERTINENT HISTORY: 02/11/2022 patient developed right-sided weakness, aphasia, went to hospital for evaluation.  Code stroke was activated.  He received TNK and then transferred to [facility].  He underwent cerebral angiogram with thrombectomy.  Unfortunately vessel reoccluded later the day and the second thrombectomy was attempted.  Left M2 dissection was identified and treated with stent but unfortunately stent demonstrated reocclusion   PAIN:  Are you having pain? No 0/10  PRECAUTIONS: Shoulder and Fall, Aphasia   WEIGHT BEARING RESTRICTIONS No  FALLS: Has patient fallen in last 6 months? Yes. Number of falls 1, required 3-assist to recover  LIVING ENVIRONMENT: Lives with: lives with their spouse Lives in: House/apartment Stairs: Yes: Internal: flight steps;  bilateral but cannot reach both and External: 4-5 steps; ramp is also present.  Has following equipment at home: Hemi walker, Wheelchair (manual), and sit-stand lift  PLOF: Independent  PATIENT GOALS regain independence  OBJECTIVE:   TODAY'S TREATMENT: 01/11/23 Activity Comments  Sidelying for gravity-minimized RLE -assisted and resisted hip flexion -assisted and resisted hip extension -assisted clam shells--blocking lumbar to facilitate isolation  Tall kneeling on folded gym mat -resisted hip hinge 3x10 -AAROM trunk rotation 2x10  Gait training - w/ give mohr sling for RUE to promote reciprocal arm swing              Access Code: [email or web address] URL: [email or web address] Date: 12/28/2022 Prepared by: [facility]  Exercises - Forward Backward Weight Shift with Counter Support  - 1 x daily - 5 x weekly - 2 sets - 10 reps - Side to Side Weight Shift with Counter Support  - 1 x daily - 5 x weekly - 2 sets - 10 reps - Mini Squat with Counter Support  - 1 x daily - 5 x weekly - 2 sets - 10 reps - Stride Stance Weight Shift  - 1 x daily - 5 x weekly - 2 sets - 10 reps - Sit to stand in stride stance  - 2 x daily - 7 x weekly - 1-2 sets - 5 reps - Supine Bridge with Mini Swiss Ball Between Knees  - 1 x daily - 5 x weekly - 2 sets - 10 reps - Supine Heel Slide  - 1 x daily - 5 x weekly - 2 sets - 10 reps - Seated Isometric Hip Adduction with Ball  - 1 x daily - 5 x weekly - 2 sets - 10 reps - 3 sec hold - Alternating Step Taps with Counter Support  - 1 x daily - 5 x weekly - 1 sets - 3-5 reps - Side Stepping with Counter Support  - 1 x daily - 5 x weekly - 1 sets - 3 reps - Prone Knee Flexion  - 1 x daily - 7 x weekly - 3 sets - 10 reps - Tall Kneeling Hip Hinge  - 1 x daily - 7 x weekly - 3 sets - 10 reps    PATIENT EDUCATION: Education details: updates on HEP Person educated: Patient and Spouse Education method: Explanation,  Demonstration, Tactile cues, Verbal cues, and Handouts Education comprehension: verbalized understanding and returned demonstration      Below measures were taken at time of initial evaluation unless otherwise specified:     DIAGNOSTIC FINDINGS: Follow-up MRI brain done revealing small areas of acute infarct in left-MCA territory  with mild hemorrhagic transformation in left parietal lobe and left-MCA bifurcation residual vascular thrombus   COGNITION: Overall cognitive status:  Difficult to assess due to aphasia   SENSATION: Light touch: Impaired  Proprioception: Impaired   COORDINATION: RLE impaired, some deficits on left foot with alternating movements and isolated movements  EDEMA:  None   MUSCLE TONE: RLE: Modifed Ashworth Scale 2 = More marked increase in muscle tone through most of the ROM, but affected part(s) easily moved right quad most affected, no clonus right plantarflexors   MUSCLE LENGTH: Able to achieve full right knee extension to PROM  DTRs:  Patella 3+ = Brisk  POSTURE: No Significant postural limitations  LOWER EXTREMITY ROM:     LLE WNL, RLE AROM limited by weakness  LOWER EXTREMITY MMT:    MMT Right Eval Left Eval  Hip flexion 3- 5  Hip extension    Hip abduction 3- 4  Hip adduction 3- 5  Hip internal rotation    Hip external rotation    Knee flexion 3 5  Knee extension 2+ 5  Ankle dorsiflexion 0 5  Ankle plantarflexion 1 3+  Ankle inversion    Ankle eversion    (Blank rows = not tested)  BED MOBILITY:  NT  TRANSFERS: Assistive device utilized: Hemi walker  Sit to stand: Modified independence Stand to sit: Modified independence Chair to chair: Modified independence Floor:  Total assist --mod A for half kneeling  RAMP:  Level of Assistance: CGA Assistive device utilized: Hemi walker Ramp Comments:   CURB:  Level of Assistance: Min A Assistive device utilized: Nutritional therapist Comments: cues in  sequence  STAIRS:  Level of Assistance: SBA  Stair Negotiation Technique: Step to Pattern with Single Rail on Left  Number of Stairs: 15   Height of Stairs: 4-6"  Comments: cues and guarding  GAIT: Gait pattern: step to pattern, decreased ankle dorsiflexion- Right, and circumduction- Right Distance walked: 150 Assistive device utilized: Quad cane large base Level of assistance: Modified independence Comments: deficits during turning   FOTO 47%  TODAY'S TREATMENT:  assessment   PATIENT EDUCATION: Education details: assessment findings, PT scope of practice Person educated: Patient and Spouse Education method: Explanation Education comprehension: verbalized understanding     GOALS: Goals reviewed with patient? Yes  SHORT TERM GOALS: Target date: 01/13/2023      Patient will perform Advanced HEP with family/caregiver supervision for improved strength, balance, transfers, and gait  Baseline: indep with initial HEP Goal status: INITIAL  2.  Demo improved safety and ability to access home environment ambulating grass and uneven surfaces w/ SBA and least restrictive AD Baseline: CGA-min A grass/slopes/gravel Goal status: IN PROGRESS   4. Demonstrate improved gait speed to 1.8 ft/sec with right AFO and least restrictive AD to improve efficiency of gait  Baseline: 0.68 ft/sec at eval; (09/20/22) 1.59 ft/sec w/ platform RW and AFO; (12/02/22) 1.4 ft/sec w/ WBQC and AFO  Goal status: IN PROGRESS  5. Improve mobility and reduce level of assist performing floor to stand transfers w/ SBA  Baseline: max A  Goal status: IN PROGRESS  LONG TERM GOALS: Target date: 02/24/2023    Demonstrate improved functional status and quality of life meeting FOTO predicted outcomes (59% predicted) Baseline: 47% Goal status:IN PROGRESS  2.  Demonstrate modified independent ambulation x 300 ft uneven surfaces using least restrictive AD in order to improve safety with home  environment Baseline: CGA-min A w/ WBQC Goal status: IN PROGRESS  3.  Manifest  improved safety with gait per time of 18 sec TUG test using least restrictive AD Baseline: 34.75 sec w/ HW; (09/20/22) 30.69 w/ HW and right AFO; (12/02/22) 23 sec w/ WBQC Goal status: IN PROGRESS   4. Stair ambulation x 15 steps with set-up assist to improve safety in home environment and reduce burden of care  Baseline: SBA with single HR, using AFO; (12/02/22) supervision  Goal status: On-going      ASSESSMENT:  CLINICAL IMPRESSION: Training in gravity-minimized position to facilitate improve RLE selective control to enhance muscular activation for gait activities with guided/assisted hip flexion-knee extension to mimic heel strike initial contact and then promoting hip extension/knee flexion pattern for pre-swing mechanics. Good tolerance and activiation throughout. Continued training in proximal strength development via tall kneeling to enhance hip extension recruitment followed by gait training to reinforce reciprocal arm swing and transverse trunk/hip movement for limb advancement which was improved with Give Mohr sling for RUE   OBJECTIVE IMPAIRMENTS Abnormal gait, decreased activity tolerance, decreased balance, decreased coordination, decreased knowledge of use of DME, decreased mobility, difficulty walking, decreased strength, impaired sensation, impaired tone, impaired UE functional use, and improper body mechanics.   ACTIVITY LIMITATIONS carrying, lifting, bending, standing, squatting, stairs, transfers, bathing, toileting, dressing, reach over head, and locomotion level  PARTICIPATION LIMITATIONS: meal prep, cleaning, laundry, interpersonal relationship, driving, shopping, community activity, occupation, yard work, and leisure activities  [place] Time since onset of injury/illness/exacerbation are also affecting patient's functional outcome.   REHAB POTENTIAL: Excellent  CLINICAL DECISION  MAKING: Evolving/moderate complexity  EVALUATION COMPLEXITY: Moderate  PLAN: PT FREQUENCY: 1-2/wk  PT DURATION: 12 weeks  PLANNED INTERVENTIONS: Therapeutic exercises, Therapeutic activity, Neuromuscular re-education, Balance training, Gait training, Patient/Family education, Self Care, Joint mobilization, Stair training, Vestibular training, Canalith repositioning, Orthotic/Fit training, DME instructions, Aquatic Therapy, Dry Needling, Electrical stimulation, Spinal mobilization, Cryotherapy, Moist heat, Splintting, Taping, and Manual therapy  PLAN FOR NEXT SESSION: Check additions to HEP and try to add retro gait to HEP.  Outdoor quad cane gait training including ramps, curbs, trial SPC.  Hip ABD strengthening/flexion/extensors also, half kneeling with folded gym mat to progress floor to stand    2:55 PM, 01/11/23 M. Sherlyn Lees, PT, DPT Physical Therapist- [place] Office Number: [phone]

## 2023-01-11 NOTE — Therapy (Signed)
OUTPATIENT SPEECH LANGUAGE PATHOLOGY TREATMENT   Patient Name: Gordon Fisher MRN: [id number] DOB:1961/03/17, 62 y.o., male Today's Date: 01/11/2023  PCP: TBD REFERRING PROVIDER: Jennye Boroughs, MD    End of Session - 01/11/23 1722     Visit Number 20    Number of Visits 31    Date for SLP Re-Evaluation 02/01/23    Authorization - Visit Number 5    Authorization - Number of Visits 20    SLP Start Time [id number]    SLP Stop Time  1400    SLP Time Calculation (min) 41 min    Activity Tolerance Patient tolerated treatment well                        Past Medical History:  Diagnosis Date   Stroke [facility])    Past Surgical History:  Procedure Laterality Date   BUBBLE STUDY  02/15/2022   Procedure: BUBBLE STUDY;  Surgeon: Gordon Lean, MD;  Location: [place];  Service: Cardiovascular;;   IR CT HEAD LTD  02/11/2022   IR CT HEAD LTD  02/11/2022   IR CT HEAD LTD  02/11/2022   IR PERCUTANEOUS ART THROMBECTOMY/INFUSION INTRACRANIAL INC DIAG ANGIO  02/11/2022   IR PERCUTANEOUS ART THROMBECTOMY/INFUSION INTRACRANIAL INC DIAG ANGIO  02/11/2022   IR US GUIDE VASC ACCESS RIGHT  02/11/2022   IR US GUIDE VASC ACCESS RIGHT  02/11/2022   RADIOLOGY WITH ANESTHESIA N/A 02/11/2022   Procedure: IR WITH ANESTHESIA;  Surgeon: Radiologist, Medication, MD;  Location: [place];  Service: Radiology;  Laterality: N/A;   RADIOLOGY WITH ANESTHESIA N/A 02/11/2022   Procedure: RADIOLOGY WITH ANESTHESIA;  Surgeon: Radiologist, Medication, MD;  Location: [place];  Service: Radiology;  Laterality: N/A;   TEE WITHOUT CARDIOVERSION N/A 02/15/2022   Procedure: TRANSESOPHAGEAL ECHOCARDIOGRAM (TEE);  Surgeon: Gordon Lean, MD;  Location: [facility] ENDOSCOPY;  Service: Cardiovascular;  Laterality: N/A;   Patient Active Problem List   Diagnosis Date Noted   Hypocalcemia 03/22/2022   DVT, lower extremity, distal ([place]) 03/22/2022   Obesity 03/22/2022   Elevated LDL cholesterol level 03/22/2022    Acute ischemic left middle cerebral artery (MCA) stroke ([place]) 02/18/2022   Cerebrovascular accident (CVA) due to occlusion of left middle cerebral artery ([place]) 02/11/2022    ONSET DATE: 02-11-22   REFERRING DIAG: I63.9 (ICD-10-CM) - Cerebrovascular accident (CVA), unspecified mechanism ([place]) R13.10 (ICD-10-CM) - Dysphagia, unspecified type   THERAPY DIAG:  Verbal apraxia  Aphasia  Rationale for Evaluation and Treatment Rehabilitation  SUBJECTIVE:   SUBJECTIVE STATEMENT: "We used it for meals more this week, and we got it out with the grandkids."  Pt accompanied by: significant other  PERTINENT HISTORY: Gordon Fisher is a 62 year old male in relatively good health who was admitted via [facility] on 02/11/22 with right sided weakness, right facial droop and slurred speech. CTH showed L-MCA hyperdense sign and he received TNK prior to transfer. CTA showed L-MCA MI occlusion and he underwent cerebral angio with thrombectomy and TICI 3 flow by Dr. Nelida Gores Norma Fisher.  Follow up MRI brain done revealing small areas of acute infarct infarct in L-MCA territory with mild hemorrhagic transformation in left parietal lobe and L-MCA bifurcation residual vascular thrombus. He was taken back for thrombectomy with stent for dissection but had intra-stent occlusion which was unable to be recanalized. Post op CT showed small SAH which was stable on follow up. He was started on ASA/Brillinta. MBS 04/18 showed oral dysphagia with oral  delay and no penetration or aspiration. He was started on D3, thins with supervision. Repeat MRI brain on 04/16 showed extensive evolving acute ischemic L-MCA occlusion increased in size with associated edema, few additional small acute cortical subcortical infarcts involving contralateral right frontal lobe and left ACA distribution. Stroke felt to be due to unknown etiology and wife elected on Zio patch on discharge to rule out A fib. Patient with resultant global aphasia with limited  verbal output, right inattention with right hemiplegia and knee instability on standing attempts. Gordon Fisher was d/c'd from [facility] on 03-19-22, and has had HHST since that time until last week.   PAIN:  Are you having pain? No  PATIENT GOALS Pt indicated he would like talking to improve  OBJECTIVE:  DIAGNOSTIC FINDINGS: MRI without contrast 02/13/22 IMPRESSION: 1. Extensive evolving acute ischemic left MCA distribution infarct, increased in size as compared to previous MRI from 02/11/2022. Associated edema without significant midline shift. 2. Small volume acute subarachnoid hemorrhage at the posterior aspect of the left Sylvian fissure, stable. 3. Few additional small volume acute ischemic cortical and subcortical infarcts involving the contralateral right frontal lobe and left ACA distribution as above. 4. Underlying chronic microvascular ischemic disease.    PATIENT REPORTED OUTCOME MEASURES (PROM): Communication Effectiveness Survey: Pt scored 8/32 - higher scores indicate more effective communication/QOL.   TODAY'S TREATMENT:  01/11/23: Pt did not schedule more visits so he will not decr to once every other week at this time. Likely to occur after next session on 01-17-23. SLP provided pt with rare min-mod A for editing icon procedure, and procedure for adding icon, and max A for typing icon name. Wife demonstrated modified independence (extra time) for these procedures. Pt answered simple questions x6 with Lingraphica with correct drill down technique 6/8 and improved to 8/8 with min A from SLP (written cue) for comprehension. SLP needed to cue Gordon Fisher today to slow her rate of speech and simplify language when talking to pt.  01/04/23: Today SLP provided support for pt and Gordon Fisher using Lingur[employer]. SLP req'd to provide pt with mod cues for programming simple tasks (sound and text) and how to navigate to edit page. SLP questions the extent pt is using the device at home. SLP told wife that pt  should use the device at LEAST 1-2 times daily and to have [place] out and open so pt is encouraged to use it more. Pt to decr to once every other week next session.   12/28/22: Pt with new device. Gordon Fisher now backing up regularly, after new icons/voices created. Today pt used Lingraphica independently for questions re: family and with occasional max A primarily for language comprehension for SLP question stimuli and then rare min A for pt's navigating to correct response. Once on the page pt activated correct icon 90%. Homework to have Gordon Fisher assist pt in generating 70% of new icons, she will do 30% herself for saving time. SLP reiterated eventually we want pt to program himslef but he needs practice to do that.   12/13/22: Pt with new device and loaner device present today. Plan was for SLP to take loaner and return to [place] however Gordon Fisher did not back up the loaner with all the farm icons and information she did since last session. SLP attempted this process which took 5 minutes but was unsuccessful. Pt req'd mod A-max A consistently with two-word icon names as they were recorded so SLP trained/re-educated pt on how to make changes to timing of icon names by  putting period and a space between the two words. Fading cues necessary ultimately to min A occasionally. SLP strongly stressed to pt and wife that pt needs to USE the device at home and provided possible opportunities for him to do so.  Gordon Fisher to contact [employer] and see how to get loaner backup onto pt's device , by next session. Three more sessions after today appear sufficient - pt/wife agree.  12/02/22: Pt entered room with device and indicated correctly the people who were at his house yesterday. He was independent with accessing vegetables he will grown in the spring. SLP assisted (mod A occasionally) pt in programming icon for "seed" and "leaf lettuce". Pt req'd mod -max cues for adding icons. Max/total A for spelling icon  names.  11/23/22: Pt has not been seen in over one month - verbal and receptive language skills have minimally improved. Pt has not spontaneously used device at home - however has used device when (rarely) prompted by wife. Gordon Fisher has programmed device a little with farm items and garden items. SLP assisted pt and wife to relocate icons to more proper location/s. Pt req'd initial demo cues faded to occasional mod cues for editing icons' pictures and sound. With text, pt req'd faded cues to occasional min cues.  Continue all goals not met. Pt verbal expression improved dramatically when looking at picture and seeing icon name.   12/18/23Laretta Fisher made some icons last week. Realized she had duplicated them when she saw them on another page and realized she had put some icons on the wrong page. Gordon Fisher is becoming more independent with deleting icons and is independent with moving icons up and down the page. He still requires occasional min A with adding and modifying icons but is improving with this as his exposure with the process continues. Pt currently without internet so he cannot search for icons or back up device at home.   10/11/22: SLP began educating pt/wife how to create icons using camera, altered pronunciation text field for "Gordon Fisher", and searching online for photo. By session end pt was accessing orange three-bar icon in upper right hand corner independently when cued "what do you press to modify or add icons?", and moving icons independently. Pt and wife to make icons for family, and use for communicating food choices.  10-08-22: Lingraphica start trial date today. SLP introduced device to pt/wife. Pt with functional verbalization within 3 seconds of initiation for "ham", "tomato", "cheese", "Gordon Fisher", and "sandwich". Pt produced "sandwich" spontaneously after 8 seconds and mutliple attempts, without label. SLP introduced how to move, add, and delete icons. Pt communicated his sandwich choice ("sandwich  meat" originally, and then "ham" after it was added to page), and toppings choices (tomato, cheese) with consistent min-mod cues, faded to occasional min-mod cues. Pt and wife to use device over the weekend for food - - SLP to assist/teach pt and Gordon Fisher how to add pictures to icons for family members next session.  09/27/22: Lingraphica not yet sent from [facility]. Should be here by pt's next appointment.  Gordon Fisher told SLP names of people at their Thanksgiving celebration, with usual mod to max assist. Visual cues were most effective on pt's most difficult words. SLP assessed pt's reading comprehension - 80% with 4-5 words.   11/8/23Laretta Fisher stated they had been busy this week with appointments but still work at practicing some. SLP initiated a 20-minute discussion today with Evette Doffing, and SLP explaining speech generating device in depth, showing Gordon Fisher and Gordon Fisher options for  trial device and deciding on the Lingraphica Touch Talk. SLP to make referral for this in the next few days.  Family names were written with mod-max A usually - pt got first 3 letters correct with childrens and grandchildrens' names. SLP showed Gordon Fisher how to break up daughter's name to [id number] to encourage brain's processing in a different way in order to foster new neural pathways to say the name.  09/06/22: SLP targeted verbal expression with picture naming and divergent naming. Pt notably demonstrates he knows the word/s he wants to say but is finding it difficult to pronounce them (verbal apraxia). Pt's expression improves with visual cues (oral cues from SLP for initial phoneme/sound) - 75% successful even without sound necessary - just visual cue for articulator placement.   09/01/22: SLP worked with pt's verbal expression and auditory comprehension today. Simple conversation/sentences were understood 90% of the time, 100% with one repeat by speaker. With opposites/paired words, pt had 70% success, improved to 90% with min A. With cloze  phrases/sentences pt's response accuracy improved with 1 syllable words, but overall success was 17% with min A, improved to 50% with mod-max A. Often, pt knew the response but knew he was going to misarticulate the response. Written cues were helpful for pt here. SLP encouraged wife to cont with cloze phrases and paired words at home.  10/25/23Laretta Fisher brought pt's cards today and SLP reviewed them with pt, modleing to wife cueing methods and hierarchy.Pt functionally said 6/7 salient objects (without written words) with mod-max cues, and 1/7 spontaneously. He said family names with written cues 6/7 (functionally), and 1/7 wth max cues Janett Billow"). Pt demo'd understnading of directions in context 75% today. SLP introduce possibility of Lingraphica to pt and wife and educated about studies proving that AAC assists verbal communication and that pt may not have to have assistance with a AAC device but we will know more as time progresses.     PATIENT EDUCATION: Education details: see "Today's therapy" Person educated: Patient and Spouse Education method: Explanation and Demonstration Education comprehension: verbalized understanding and needs further education   GOALS: Goals reviewed with patient? Yes, in general  SHORT TERM GOALS: Target date: 08/31/2022 extended one week due to visits  Pt will name 6 salient family names when given picture and written name with consistent mod-max cues in 3 sessions (precursor for speech generating device) Baseline:08-25-22. 09-01-22 Goal status: partially met  2.  Pt will produce simple phoneme/ bilabial+/a/  /a/, /ba/, /ma/, and/or /pa/ simultaneously with SLP 65% success (equal # reps/stimulus) in 2 sessions Baseline:  Goal status: deferred - pt to focus on salient items/terms  3.  Pt will demo understanding of simple commands in context 80% over three sessions Baseline: 08-25-22, 09-01-22 Goal status: Partially met  4.  Pt will indicate understanding of  a complex sentence 85% of the time in 3 sessions (precursor for speech generating device) Baseline:  Goal status: not met  5.  Pt will copy written family names with 50% success in two sessions  Baseline:  Goal Status: met   LONG TERM GOALS: Target date: 10/24/2022 , 02/01/23  Pt will name 6 salient family names when given picture and written name with consistent mod cues in 3 sessions (precursor for speech generating device) Baseline: 12/28/22, 01-04-23 Goal status: met  2.  Pt will IMITATE simple phoneme/bilabial+/a/  /a/, /ba/, /ma/, and/or /pa/  65% success (equal # reps/stimulus) in 2 sessions Baseline:  Goal status: deferred - working with pt on  more salient words  3.  Pt will generate meaningful message understood by [name] using speech generating device 60% of the time in 3 sessions Baseline: 01/11/23 Goal status: ongoing  4.  [name] will indicate that spontaneous communication is easier than during week of evaluation Baseline:  Goal status: Ongoing   ASSESSMENT:  CLINICAL IMPRESSION: Gordon Fisher is a 62 y.o. male who cont to be seen today for treatment of verbal apraxia (as pt often indicates he knows the response but knows it will not be articulated correctly) and aphasia. Treatment involves use of pt's Lingraphica speech generating device (SGD). SEE TX NOTE from today for more details.   OBJECTIVE IMPAIRMENTS include expressive language, receptive language, and aphasia. These impairments are limiting patient from return to work, managing medications, managing appointments, household responsibilities, ADLs/IADLs, and effectively communicating at home and in community. Factors affecting potential to achieve goals and functional outcome are ability to learn/carryover information and severity of impairments. Patient will benefit from skilled SLP services to address above impairments and improve overall function.  REHAB POTENTIAL: Good  PLAN: SLP FREQUENCY: 2x/week  SLP DURATION: 8  weeks  PLANNED INTERVENTIONS: Language facilitation, Environmental controls, Cueing hierachy, Internal/external aids, Functional tasks, and Multimodal communication approach    Darvell Monteforte, CCC-SLP 01/11/2023, 5:23 PM

## 2023-01-13 ENCOUNTER — Ambulatory Visit: Payer: 59

## 2023-01-13 DIAGNOSIS — I639 Cerebral infarction, unspecified: Secondary | ICD-10-CM

## 2023-01-13 LAB — CUP PACEART REMOTE DEVICE CHECK
Date Time Interrogation Session: 20240313062301
Implantable Pulse Generator Implant Date: 20230807
Pulse Gen Serial Number: 511016023

## 2023-01-17 ENCOUNTER — Ambulatory Visit: Payer: 59

## 2023-01-17 ENCOUNTER — Ambulatory Visit: Payer: 59 | Admitting: Occupational Therapy

## 2023-01-17 DIAGNOSIS — I69351 Hemiplegia and hemiparesis following cerebral infarction affecting right dominant side: Secondary | ICD-10-CM

## 2023-01-17 DIAGNOSIS — M6281 Muscle weakness (generalized): Secondary | ICD-10-CM

## 2023-01-17 DIAGNOSIS — R2689 Other abnormalities of gait and mobility: Secondary | ICD-10-CM

## 2023-01-17 DIAGNOSIS — R4701 Aphasia: Secondary | ICD-10-CM | POA: Diagnosis not present

## 2023-01-17 DIAGNOSIS — R208 Other disturbances of skin sensation: Secondary | ICD-10-CM

## 2023-01-17 DIAGNOSIS — R278 Other lack of coordination: Secondary | ICD-10-CM

## 2023-01-17 DIAGNOSIS — R2681 Unsteadiness on feet: Secondary | ICD-10-CM

## 2023-01-17 DIAGNOSIS — R482 Apraxia: Secondary | ICD-10-CM

## 2023-01-17 DIAGNOSIS — R262 Difficulty in walking, not elsewhere classified: Secondary | ICD-10-CM

## 2023-01-17 NOTE — Therapy (Signed)
OUTPATIENT OCCUPATIONAL THERAPY NEURO TREATMENT NOTE  Patient Name: Gordon Fisher MRN: [id number] DOB:September 24, 1961, 62 y.o., male Today's Date: 01/17/2023  PCP: none on file REFERRING PROVIDER: Jennye Boroughs, MD    OT End of Session - 01/17/23 0935     Visit Number 24    Number of Visits 30    Date for OT Re-Evaluation 02/25/23    Authorization Type [employer]    Authorization Time Period VL: 60 combined (OT/PT/ST)    Authorization - Number of Visits 20    OT Start Time 0933    OT Stop Time 1015    OT Time Calculation (min) 42 min    Activity Tolerance Patient tolerated treatment well    Behavior During Therapy WFL for tasks assessed/performed                           Past Medical History:  Diagnosis Date   Stroke [facility])    Past Surgical History:  Procedure Laterality Date   BUBBLE STUDY  02/15/2022   Procedure: BUBBLE STUDY;  Surgeon: Werner Lean, MD;  Location: [place];  Service: Cardiovascular;;   IR CT HEAD LTD  02/11/2022   IR CT HEAD LTD  02/11/2022   IR CT HEAD LTD  02/11/2022   IR PERCUTANEOUS ART THROMBECTOMY/INFUSION INTRACRANIAL INC DIAG ANGIO  02/11/2022   IR PERCUTANEOUS ART THROMBECTOMY/INFUSION INTRACRANIAL INC DIAG ANGIO  02/11/2022   IR US GUIDE VASC ACCESS RIGHT  02/11/2022   IR US GUIDE VASC ACCESS RIGHT  02/11/2022   RADIOLOGY WITH ANESTHESIA N/A 02/11/2022   Procedure: IR WITH ANESTHESIA;  Surgeon: Radiologist, Medication, MD;  Location: [place];  Service: Radiology;  Laterality: N/A;   RADIOLOGY WITH ANESTHESIA N/A 02/11/2022   Procedure: RADIOLOGY WITH ANESTHESIA;  Surgeon: Radiologist, Medication, MD;  Location: [place];  Service: Radiology;  Laterality: N/A;   TEE WITHOUT CARDIOVERSION N/A 02/15/2022   Procedure: TRANSESOPHAGEAL ECHOCARDIOGRAM (TEE);  Surgeon: Werner Lean, MD;  Location: [facility];  Service: Cardiovascular;  Laterality: N/A;   Patient Active Problem List   Diagnosis Date Noted    Hypocalcemia 03/22/2022   DVT, lower extremity, distal ([place]) 03/22/2022   Obesity 03/22/2022   Elevated LDL cholesterol level 03/22/2022   Acute ischemic left middle cerebral artery (MCA) stroke ([place]) 02/18/2022   Cerebrovascular accident (CVA) due to occlusion of left middle cerebral artery ([place]) 02/11/2022    ONSET DATE: referral 07/15/22 (CVA 02/11/22)  REFERRING DIAG: I63.9 (ICD-10-CM) - Cerebrovascular accident (CVA), unspecified mechanism ([place]) R13.10 (ICD-10-CM) - Dysphagia, unspecified type   THERAPY DIAG:  Muscle weakness (generalized)  Hemiplegia and hemiparesis following cerebral infarction affecting right dominant side (HCC)  Other lack of coordination  Other disturbances of skin sensation  Rationale for Evaluation and Treatment Rehabilitation  SUBJECTIVE:   SUBJECTIVE STATEMENT: Pt's spouse stating that pt is getting dressed more independently, however is still not utilizing RUE at any assist. 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 No  FALLS: Has patient fallen in last 6 months? Yes. Number of falls 1  PLOF: Independent, Independent with basic ADLs, and Independent with gait  PATIENT GOALS: continue to make gains, independence with getting dressed   OBJECTIVE:   HAND DOMINANCE: Right  FUNCTIONAL OUTCOME MEASURES: FOTO: 11  UPPER EXTREMITY ROM    Active ROM:  Pt with no AROM during evaluation.  Pt was able to elicit min shoulder elevation and slight gross grasp when cued and provided increased time to initiate movement.   09/22/22: Pt still with no AROM in upright position, is able to elicit slight internal rotation and mod elbow flexion/extension in gravity assisted position. [id number]: Pt eliciting trace shoulder flexion, 44* elbow flexion, -16* elbow extension, and able to elicit thumb  extension with shoulder internal rotation.  Pt demonstrating ~30% of scapular retraction and elevation with intermitent tactile cues.     Passive ROM Right Eval - 9/25 Right 11/23/22  Shoulder flexion 100 120  Shoulder abduction  95  Shoulder adduction    Shoulder extension    Shoulder internal rotation WNL Unable to tolerate reaching past torso towards back pocket  Shoulder external rotation onset of pain >5-10* from neutral Onset of pain > 30*  Middle trapezius    Lower trapezius    Elbow flexion WNL WNL  Elbow extension WNL WNL  Wrist flexion WNL WNL  Wrist extension WNL WNL  Wrist ulnar deviation    Wrist radial deviation    Wrist pronation    Wrist supination    (Blank rows = not tested)  HAND FUNCTION: 11/23/22: loose gross grasp with increased time for initiation, unable to release grasp  SENSATION: Difficult to assess due to expressive aphasia  MUSCLE TONE: RUE: Mild and Hypertonic  ------------------------------------------------------------------------------------------------------------------------------------------------------------ (objective measures above completed at initial evaluation unless otherwise dated)  TODAY'S TREATMENT: 01/17/23 NMES: NMES applied to supraspinatus and middle deltoid to help approximate shoulder joint to reduce sublux and reduce pain. Pt demonstrating slight activation at deltoid with NMES however not enough to elicit movement.  Pt with no c/o pain at beginning or end of session.  No adverse reactions after treatment and skin intact.  Attempted to elicit finger extension with electrodes placed on forearm extensors.  Pt with no sensation to stimulation and no activation, therefore terminated task. Ratio 1:3 Rate 35 pps Waveform- Asymmetric Ramp 1.0 Pulse 300 Intensity- 180 volts Duration -   8 min at deltoid and 3 forearm extensors NMR: engaged in shoulder elevation and finger flexion with OT providing tactile cues and support.  Pt  able to elicit finger flexion this session with increased time and effort, however unable to sustain grasp on items and with inability to release grasp.     01/11/23 NMR: engaged in WB through RUE with OT providing facilitation and positioning through hand and elbow to facilitate increased WB for NMR.  Engaged in reaching across midline with LUE to further facilitate weight shifting through RUE.  Transitioned to WB through elbow with focus on transitioning down into sidelying and then pushing up through RUE to facilitate increased WB and motor recovery.  UE ranger: OT providing facilitation and tapping and tricep for increased forward reach and elbow extension.  OT providing intermittent tactile cues under and behind elbow to decrease effects of gravity on activation of movement.  Pt demonstrating intermittent internal rotation with shoulder flexion, however with verbal cue pt able to correct.  Pt demonstrating increased elbow flexion/extension when providing verbal cue to "push" and "pull".   NMR: engaged in rolling ball forward/backward on leg with focus on motor control with elbow flexion/extension and shoulder flexion.  OT challenged pt to move ball on leg in circular pattern clockwise and counter clockwise.  Pt able to complete but with significant increased time and effort.    01/04/23 NMR: engaged in sidelying shoulder flexion, extension,  and elbow flexion and extension. OT utilizing sidelying to facilitate improved scapular protraction and retraction and increased shoulder flexion without gravity impacting mobility.  Attempted to engage in ROM on top of surface to allow for increased facilitation, however pt benefiting from tactile cues and arm positioned on therapist's forearm to further grade mobility and pressure.  Pt demonstrating improved shoulder activation with tactile cues, verbal cues to "push" and "pull", and support to forearm as pt frequently eliciting shoulder internal rotation  limiting ROM.  Elbow flexion/extension: Pt demonstrating increased tone in elbow about 90* flexion.  OT able to range arm through positioning with full flexion and extension to ~10* from neutral.  Pt demonstrating flexion from nearly straight to 90* and demonstrating extension from full flexion to ~100* flexion before tone setting in.  OT educated pt's spouse and sister in law on hand placement to facilitate PROM/stretching to elbow while ensuring alignment and no stress on shoulder. E-stim: attempted to utilize NMES to aid in shoulder flexion in sidelying, however minimal activation noted with stimulation, therefore resumed hand over hand facilitation from OT.   PATIENT EDUCATION: Ongoing condition-specific education related to therapeutic interventions completed this session Person educated: Patient and Spouse Education method: Explanation, Demonstration, Tactile cues, Verbal cues, and Handouts Education comprehension: verbalized understanding and needs further education   HOME EXERCISE PROGRAM: Written exercises of shoulder and elbow in supine with cues for hand placement and facilitation.   Self-ROM handout from [facility] lab  Access Code: [email or web address] URL: [email or web address] Date: 12/28/2022 Prepared by: [facility]  Exercises - Supine Shoulder Flexion AAROM with Hands Clasped  - 1 x daily - 1 sets - 10 reps - Supine Shoulder Abduction PROM with Caregiver  - 1 x daily - 10 reps - Supine Shoulder Abduction AAROM with Dowel  - 1 x daily - 3 x weekly - 10 reps - Seated Elbow Extension and Shoulder External Rotation AAROM at Table with Towel  - 1 x daily - 1 sets - 10 reps - Finger Extension with Wrist Extension Caregiver PROM  - 1 x daily - 1 sets - 10 reps - Seated Dowel Chest Press  - 3 x weekly - 1-2 sets - 10 reps - Seated Shoulder Flexion with Dowel to 90  - 3 x weekly - 1-2 sets - 10 reps  GOALS: Goals reviewed with patient?  Yes  SHORT TERM GOALS:   12/24/22  Pt and spouse will be independent with advanced PROM and AAROM HEP for shoulder stability and shoulder and elbow ROM. Baseline: Goal status: MET - 12/28/22  2.  Pt and spouse will verbalize understanding of AE and/or task modifications as needed for fastening shoes.  Baseline:  Goal status: IN PROGRESS   LONG TERM GOALS: Target date 01/21/23  Patient will be able to incorporate RUE into UB/LB dressing task at stabilizer to gross assist level. Baseline: Goal status: Met - 01/17/23  2.   Pt will utilize RUE as gross assist during grooming tasks (such as washing hands and face). Baseline:  Goal status: Not met - still as stabilizer 01/17/23  3.   Pt and spouse will verbalize and/or  demonstrate understanding of advanced treatment strategies and implementation at home (NMES, WB) to facilitate increased motor recovery.  Baseline:  Goal status: Partly Met - 01/17/23  4.   Pt will demonstrate ability to utilize BUE during mobility tasks to pushup from chair with BUE on arm rests.  Baseline:  Goal status: Not  met - 01/17/23  5.   Pt will demonstrate ability to don/doff footwear (to include socks and shoes) with supervision and use of AE PRN.  Baseline:  Goal status: Met - 01/17/23  NEW LONG TERM GOALS: Target date 02/25/23  1.  Pt will utilize RUE as gross assist during grooming tasks (such as washing hands and face). Baseline:  Goal status: INITIAL  2.   Pt and spouse will verbalize and/or  demonstrate understanding of advanced treatment strategies and implementation at home (NMES) to facilitate increased motor recovery.  Baseline:  Goal status: INITIAL  3.   Pt will demonstrate ability to utilize BUE during mobility tasks to pushup from chair with BUE on arm rests.  Baseline:  Goal status: INITIAL   ASSESSMENT:  CLINICAL IMPRESSION: Treatment session with focus on NMR of RUE and planning for future session due to pt visit limit and current  status.  OT discussed with pt and pt's spouse current status with increased shoulder mobility, elbow flexion/extension, and gross grasp.  Discussed limited progress towards goals as pt is able to elicit movement, but it still is not overly functional.  Discussed diminished progress with use of NMES due to need for increased amplitude and/or increased size of electrodes for increased activation.  Pt's spouse asking about purchasing system for home, discussed online options and plan to create recommendations for a pool of clinicians to further guide purchase selection.  Pt will continue to benefit from OT services to focus on making further planning for equipment management and advanced treatment strategies.    PERFORMANCE DEFICITS in functional skills including ADLs, IADLs, coordination, sensation, tone, ROM, strength, pain, flexibility, FMC, GMC, mobility, balance, body mechanics, vision, and UE functional use and psychosocial skills including environmental adaptation and routines and behaviors.   IMPAIRMENTS are limiting patient from ADLs and IADLs.   COMORBIDITIES may have co-morbidities  that affects occupational performance. Patient will benefit from skilled OT to address above impairments and improve overall function.   PLAN: OT FREQUENCY: 1-2x/week  OT DURATION: 4 weeks  PLANNED INTERVENTIONS: self care/ADL training, therapeutic exercise, therapeutic activity, neuromuscular re-education, manual therapy, passive range of motion, balance training, functional mobility training, splinting, electrical stimulation, compression bandaging, moist heat, cryotherapy, patient/family education, visual/perceptual remediation/compensation, psychosocial skills training, energy conservation, coping strategies training, and DME and/or AE instructions  RECOMMENDED OTHER SERVICES: N/A  CONSULTED AND AGREED WITH PLAN OF CARE: Patient and family member/caregiver  PLAN FOR NEXT SESSION:  WB and dynamic standing,  supine and sidelying NMR.  Increased focus on incorporation of RUE into functional tasks.  E-stim as cleared by MD.   Simonne Come, OTR/L 01/17/2023, 12:58 PM

## 2023-01-17 NOTE — Therapy (Signed)
OUTPATIENT SPEECH LANGUAGE PATHOLOGY TREATMENT   Patient Name: Gordon Fisher MRN: [id number] DOB:[birth year], 62 y.o., male Today's Date: 01/17/2023  PCP: TBD REFERRING PROVIDER: Jennye Boroughs, MD    End of Session - 01/17/23 0835     Visit Number 21    Number of Visits 31    Date for SLP Re-Evaluation 02/01/23    Authorization - Visit Number 6    Authorization - Number of Visits 20    SLP Start Time 0848    SLP Stop Time  0930    SLP Time Calculation (min) 42 min    Activity Tolerance Patient tolerated treatment well                        Past Medical History:  Diagnosis Date   Stroke [facility])    Past Surgical History:  Procedure Laterality Date   BUBBLE STUDY  02/15/2022   Procedure: BUBBLE STUDY;  Surgeon: Werner Lean, MD;  Location: [place];  Service: Cardiovascular;;   IR CT HEAD LTD  02/11/2022   IR CT HEAD LTD  02/11/2022   IR CT HEAD LTD  02/11/2022   IR PERCUTANEOUS ART THROMBECTOMY/INFUSION INTRACRANIAL INC DIAG ANGIO  02/11/2022   IR PERCUTANEOUS ART THROMBECTOMY/INFUSION INTRACRANIAL INC DIAG ANGIO  02/11/2022   IR US GUIDE VASC ACCESS RIGHT  02/11/2022   IR US GUIDE VASC ACCESS RIGHT  02/11/2022   RADIOLOGY WITH ANESTHESIA N/A 02/11/2022   Procedure: IR WITH ANESTHESIA;  Surgeon: Radiologist, Medication, MD;  Location: [place];  Service: Radiology;  Laterality: N/A;   RADIOLOGY WITH ANESTHESIA N/A 02/11/2022   Procedure: RADIOLOGY WITH ANESTHESIA;  Surgeon: Radiologist, Medication, MD;  Location: [place];  Service: Radiology;  Laterality: N/A;   TEE WITHOUT CARDIOVERSION N/A 02/15/2022   Procedure: TRANSESOPHAGEAL ECHOCARDIOGRAM (TEE);  Surgeon: Werner Lean, MD;  Location: [facility] ENDOSCOPY;  Service: Cardiovascular;  Laterality: N/A;   Patient Active Problem List   Diagnosis Date Noted   Hypocalcemia 03/22/2022   DVT, lower extremity, distal ([place]) 03/22/2022   Obesity 03/22/2022   Elevated LDL cholesterol level 03/22/2022    Acute ischemic left middle cerebral artery (MCA) stroke ([place]) 02/18/2022   Cerebrovascular accident (CVA) due to occlusion of left middle cerebral artery ([place]) 02/11/2022    ONSET DATE: 02-11-22   REFERRING DIAG: I63.9 (ICD-10-CM) - Cerebrovascular accident (CVA), unspecified mechanism ([place]) R13.10 (ICD-10-CM) - Dysphagia, unspecified type   THERAPY DIAG:  Verbal apraxia  Aphasia  Rationale for Evaluation and Treatment Rehabilitation  SUBJECTIVE:   SUBJECTIVE STATEMENT: "I'm comfortable with the adding and the editing, [name] is some."  Pt accompanied by: significant other  PERTINENT HISTORY: Gordon Fisher is a 62 year old male in relatively good health who was admitted via [facility] on 02/11/22 with right sided weakness, right facial droop and slurred speech. CTH showed L-MCA hyperdense sign and he received TNK prior to transfer. CTA showed L-MCA MI occlusion and he underwent cerebral angio with thrombectomy and TICI 3 flow by Dr. Nelida Gores [name].  Follow up MRI brain done revealing small areas of acute infarct infarct in L-MCA territory with mild hemorrhagic transformation in left parietal lobe and L-MCA bifurcation residual vascular thrombus. He was taken back for thrombectomy with stent for dissection but had intra-stent occlusion which was unable to be recanalized. Post op CT showed small SAH which was stable on follow up. He was started on ASA/Brillinta. MBS 04/18 showed oral dysphagia with oral delay and no penetration or  aspiration. He was started on D3, thins with supervision. Repeat MRI brain on 04/16 showed extensive evolving acute ischemic L-MCA occlusion increased in size with associated edema, few additional small acute cortical subcortical infarcts involving contralateral right frontal lobe and left ACA distribution. Stroke felt to be due to unknown etiology and wife elected on Zio patch on discharge to rule out A fib. Patient with resultant global aphasia with limited verbal  output, right inattention with right hemiplegia and knee instability on standing attempts. [name] was d/c'd from [facility] on 03-19-22, and has had HHST since that time until last week.   PAIN:  Are you having pain? No  PATIENT GOALS Pt indicated he would like talking to improve  OBJECTIVE:  DIAGNOSTIC FINDINGS: MRI without contrast 02/13/22 IMPRESSION: 1. Extensive evolving acute ischemic left MCA distribution infarct, increased in size as compared to previous MRI from 02/11/2022. Associated edema without significant midline shift. 2. Small volume acute subarachnoid hemorrhage at the posterior aspect of the left Sylvian fissure, stable. 3. Few additional small volume acute ischemic cortical and subcortical infarcts involving the contralateral right frontal lobe and left ACA distribution as above. 4. Underlying chronic microvascular ischemic disease.    PATIENT REPORTED OUTCOME MEASURES (PROM): Communication Effectiveness Survey: Pt scored 8/32 - higher scores indicate more effective communication/QOL.   TODAY'S TREATMENT:  01/17/23: [name] added some diagnoses to "health". SLP asked pt which diagnoses were pertinent for his speech difficulty and he req'd max A. Later in session he req'd extra time for success. Using Lingraphica, pt answered questions with 90% success and min A rarely to navigate to correct page. He req'd min A occasionally for editing an icon, and min A rarely for adding icon to a page. Pt demonstrated understanding of SLP simple questions with rare minA. Pt to cont at once/two weeks x2-3 more visits.   01/11/23: Pt did not schedule more visits so he will not decr to once every other week at this time. Likely to occur after next session on 01-17-23. SLP provided pt with rare min-mod A for editing icon procedure, and procedure for adding icon, and max A for typing icon name. Wife demonstrated modified independence (extra time) for these procedures. Pt answered simple questions x6 with  Lingraphica with correct drill down technique 6/8 and improved to 8/8 with min A from SLP (written cue) for comprehension. SLP needed to cue [name] today to slow her rate of speech and simplify language when talking to pt.  01/04/23: Today SLP provided support for pt and [name] using Linguraphica. SLP req'd to provide pt with mod cues for programming simple tasks (sound and text) and how to navigate to edit page. SLP questions the extent pt is using the device at home. SLP told wife that pt should use the device at LEAST 1-2 times daily and to have Naches out and open so pt is encouraged to use it more. Pt to decr to once every other week next session.   12/28/22: Pt with new device. [name] now backing up regularly, after new icons/voices created. Today pt used Lingraphica independently for questions re: family and with occasional max A primarily for language comprehension for SLP question stimuli and then rare min A for pt's navigating to correct response. Once on the page pt activated correct icon 90%. Homework to have [name] assist pt in generating 70% of new icons, she will do 30% herself for saving time. SLP reiterated eventually we want pt to program himslef but he needs practice to do  that.   12/13/22: Pt with new device and loaner device present today. Plan was for SLP to take loaner and return to [place] however [name] did not back up the loaner with all the farm icons and information she did since last session. SLP attempted this process which took 5 minutes but was unsuccessful. Pt req'd mod A-max A consistently with two-word icon names as they were recorded so SLP trained/re-educated pt on how to make changes to timing of icon names by putting period and a space between the two words. Fading cues necessary ultimately to min A occasionally. SLP strongly stressed to pt and wife that pt needs to USE the device at home and provided possible opportunities for him to do so.  [name] to contact  Lingraphica and see how to get loaner backup onto pt's device , by next session. Three more sessions after today appear sufficient - pt/wife agree.  12/02/22: Pt entered room with device and indicated correctly the people who were at his house yesterday. He was independent with accessing vegetables he will grown in the spring. SLP assisted (mod A occasionally) pt in programming icon for "seed" and "leaf lettuce". Pt req'd mod -max cues for adding icons. Max/total A for spelling icon names.  11/23/22: Pt has not been seen in over one month - verbal and receptive language skills have minimally improved. Pt has not spontaneously used device at home - however has used device when (rarely) prompted by wife. [name] has programmed device a little with farm items and garden items. SLP assisted pt and wife to relocate icons to more proper location/s. Pt req'd initial demo cues faded to occasional mod cues for editing icons' pictures and sound. With text, pt req'd faded cues to occasional min cues.  Continue all goals not met. Pt verbal expression improved dramatically when looking at picture and seeing icon name.   12/18/23[name] made some icons last week. Realized she had duplicated them when she saw them on another page and realized she had put some icons on the wrong page. [name] is becoming more independent with deleting icons and is independent with moving icons up and down the page. He still requires occasional min A with adding and modifying icons but is improving with this as his exposure with the process continues. Pt currently without internet so he cannot search for icons or back up device at home.   10/11/22: SLP began educating pt/wife how to create icons using camera, altered pronunciation text field for "leesa", and searching online for photo. By session end pt was accessing orange three-bar icon in upper right hand corner independently when cued "what do you press to modify or add icons?", and moving  icons independently. Pt and wife to make icons for family, and use for communicating food choices.  10-08-22: Lingraphica start trial date today. SLP introduced device to pt/wife. Pt with functional verbalization within 3 seconds of initiation for "ham", "tomato", "cheese", "[name]", and "sandwich". Pt produced "sandwich" spontaneously after 8 seconds and mutliple attempts, without label. SLP introduced how to move, add, and delete icons. Pt communicated his sandwich choice ("sandwich meat" originally, and then "ham" after it was added to page), and toppings choices (tomato, cheese) with consistent min-mod cues, faded to occasional min-mod cues. Pt and wife to use device over the weekend for food - - SLP to assist/teach pt and [name] how to add pictures to icons for family members next session.  09/27/22: Lingraphica not yet sent from [facility]. Should be  here by pt's next appointment.  [name] told SLP names of people at their Thanksgiving celebration, with usual mod to max assist. Visual cues were most effective on pt's most difficult words. SLP assessed pt's reading comprehension - 80% with 4-5 words.   11/8/23[name] stated they had been busy this week with appointments but still work at practicing some. SLP initiated a 20-minute discussion today with Evette Doffing, and SLP explaining speech generating device in depth, showing [name] and [name] options for trial device and deciding on the Lingraphica Touch Talk. SLP to make referral for this in the next few days.  Family names were written with mod-max A usually - pt got first 3 letters correct with childrens and grandchildrens' names. SLP showed [name] how to break up daughter's name to [id number] to encourage brain's processing in a different way in order to foster new neural pathways to say the name.  09/06/22: SLP targeted verbal expression with picture naming and divergent naming. Pt notably demonstrates he knows the word/s he wants to say but is finding it  difficult to pronounce them (verbal apraxia). Pt's expression improves with visual cues (oral cues from SLP for initial phoneme/sound) - 75% successful even without sound necessary - just visual cue for articulator placement.   09/01/22: SLP worked with pt's verbal expression and auditory comprehension today. Simple conversation/sentences were understood 90% of the time, 100% with one repeat by speaker. With opposites/paired words, pt had 70% success, improved to 90% with min A. With cloze phrases/sentences pt's response accuracy improved with 1 syllable words, but overall success was 17% with min A, improved to 50% with mod-max A. Often, pt knew the response but knew he was going to misarticulate the response. Written cues were helpful for pt here. SLP encouraged wife to cont with cloze phrases and paired words at home.  10/25/23[name] brought pt's cards today and SLP reviewed them with pt, modleing to wife cueing methods and hierarchy.Pt functionally said 6/7 salient objects (without written words) with mod-max cues, and 1/7 spontaneously. He said family names with written cues 6/7 (functionally), and 1/7 wth max cues Janett Billow"). Pt demo'd understnading of directions in context 75% today. SLP introduce possibility of Lingraphica to pt and wife and educated about studies proving that AAC assists verbal communication and that pt may not have to have assistance with a AAC device but we will know more as time progresses.     PATIENT EDUCATION: Education details: see "Today's therapy" Person educated: Patient and Spouse Education method: Explanation and Demonstration Education comprehension: verbalized understanding and needs further education   GOALS: Goals reviewed with patient? Yes, in general  SHORT TERM GOALS: Target date: 08/31/2022 extended one week due to visits  Pt will name 6 salient family names when given picture and written name with consistent mod-max cues in 3 sessions (precursor for  speech generating device) Baseline:08-25-22. 09-01-22 Goal status: partially met  2.  Pt will produce simple phoneme/ bilabial+/a/  /a/, /ba/, /ma/, and/or /pa/ simultaneously with SLP 65% success (equal # reps/stimulus) in 2 sessions Baseline:  Goal status: deferred - pt to focus on salient items/terms  3.  Pt will demo understanding of simple commands in context 80% over three sessions Baseline: 08-25-22, 09-01-22 Goal status: Partially met  4.  Pt will indicate understanding of a complex sentence 85% of the time in 3 sessions (precursor for speech generating device) Baseline:  Goal status: not met  5.  Pt will copy written family names with 50% success  in two sessions  Baseline:  Goal Status: met   LONG TERM GOALS: Target date: 10/24/2022 , 02/01/23  Pt will name 6 salient family names when given picture and written name with consistent mod cues in 3 sessions (precursor for speech generating device) Baseline: 12/28/22, 01-04-23 Goal status: met  2.  Pt will IMITATE simple phoneme/bilabial+/a/  /a/, /ba/, /ma/, and/or /pa/  65% success (equal # reps/stimulus) in 2 sessions Baseline:  Goal status: deferred - working with pt on more salient words  3.  Pt will generate meaningful message understood by [name] using speech generating device 60% of the time in 3 sessions Baseline: 01/11/23, 01/17/23 Goal status: ongoing  4.  [name] will indicate that spontaneous communication is easier than during week of evaluation Baseline:  Goal status: Ongoing   ASSESSMENT:  CLINICAL IMPRESSION: [name] is a 62 y.o. male who cont to be seen today for treatment of verbal apraxia (as pt often indicates he knows the response but knows it will not be articulated correctly) and aphasia. Treatment involves use of pt's Lingraphica speech generating device (SGD). SEE TX NOTE from today for more details. Pt has been practicing with the device - evidenced by his taking less time and requiring less frequent and less  intense cues for answering questions with device.   OBJECTIVE IMPAIRMENTS include expressive language, receptive language, and aphasia. These impairments are limiting patient from return to work, managing medications, managing appointments, household responsibilities, ADLs/IADLs, and effectively communicating at home and in community. Factors affecting potential to achieve goals and functional outcome are ability to learn/carryover information and severity of impairments. Patient will benefit from skilled SLP services to address above impairments and improve overall function.  REHAB POTENTIAL: Good  PLAN: SLP FREQUENCY: 2x/week  SLP DURATION: 8 weeks  PLANNED INTERVENTIONS: Language facilitation, Environmental controls, Cueing hierachy, Internal/external aids, Functional tasks, and Multimodal communication approach    Karine Garn, CCC-SLP 01/17/2023, 11:08 PM

## 2023-01-17 NOTE — Therapy (Signed)
OUTPATIENT PHYSICAL THERAPY NEURO TREATMENT   Patient Name: Gordon Fisher MRN: [id number] DOB:16-Jun-1961, 62 y.o., male Today's Date: 01/17/2023   PCP: None listed REFERRING PROVIDER: Jennye Boroughs, MD       PT End of Session - 01/17/23 0808     Visit Number 26    Number of Visits 40    Date for PT Re-Evaluation 02/24/23    Authorization Type [employer] 2024-PT, OT, speech combined 68 visits    Authorization - Visit Number 7    Authorization - Number of Visits 30    PT Start Time 0805    PT Stop Time 0845    PT Time Calculation (min) 40 min    Activity Tolerance Patient tolerated treatment well    Behavior During Therapy [facility] for tasks assessed/performed                       Past Medical History:  Diagnosis Date   Stroke [facility])    Past Surgical History:  Procedure Laterality Date   BUBBLE STUDY  02/15/2022   Procedure: BUBBLE STUDY;  Surgeon: Werner Lean, MD;  Location: [place];  Service: Cardiovascular;;   IR CT HEAD LTD  02/11/2022   IR CT HEAD LTD  02/11/2022   IR CT HEAD LTD  02/11/2022   IR PERCUTANEOUS ART THROMBECTOMY/INFUSION INTRACRANIAL INC DIAG ANGIO  02/11/2022   IR PERCUTANEOUS ART THROMBECTOMY/INFUSION INTRACRANIAL INC DIAG ANGIO  02/11/2022   IR US GUIDE VASC ACCESS RIGHT  02/11/2022   IR US GUIDE VASC ACCESS RIGHT  02/11/2022   RADIOLOGY WITH ANESTHESIA N/A 02/11/2022   Procedure: IR WITH ANESTHESIA;  Surgeon: Radiologist, Medication, MD;  Location: [place];  Service: Radiology;  Laterality: N/A;   RADIOLOGY WITH ANESTHESIA N/A 02/11/2022   Procedure: RADIOLOGY WITH ANESTHESIA;  Surgeon: Radiologist, Medication, MD;  Location: [place];  Service: Radiology;  Laterality: N/A;   TEE WITHOUT CARDIOVERSION N/A 02/15/2022   Procedure: TRANSESOPHAGEAL ECHOCARDIOGRAM (TEE);  Surgeon: Werner Lean, MD;  Location: [facility] ENDOSCOPY;  Service: Cardiovascular;  Laterality: N/A;   Patient Active Problem List   Diagnosis Date Noted    Hypocalcemia 03/22/2022   DVT, lower extremity, distal ([place]) 03/22/2022   Obesity 03/22/2022   Elevated LDL cholesterol level 03/22/2022   Acute ischemic left middle cerebral artery (MCA) stroke ([place]) 02/18/2022   Cerebrovascular accident (CVA) due to occlusion of left middle cerebral artery ([place]) 02/11/2022    ONSET DATE: April 2023  REFERRING DIAG: I63.9 (ICD-10-CM) - Cerebrovascular accident (CVA), unspecified mechanism ([place]) R13.10 (ICD-10-CM) - Dysphagia, unspecified type   THERAPY DIAG:  Muscle weakness (generalized)  Unsteadiness on feet  Hemiplegia and hemiparesis following cerebral infarction affecting right dominant side (HCC)  Other abnormalities of gait and mobility  Difficulty in walking, not elsewhere classified  Rationale for Evaluation and Treatment Rehabilitation  SUBJECTIVE:  SUBJECTIVE STATEMENT:  Feeling good  Pt accompanied by: significant other  PERTINENT HISTORY: 02/11/2022 patient developed right-sided weakness, aphasia, went to hospital for evaluation.  Code stroke was activated.  He received TNK and then transferred to [facility].  He underwent cerebral angiogram with thrombectomy.  Unfortunately vessel reoccluded later the day and the second thrombectomy was attempted.  Left M2 dissection was identified and treated with stent but unfortunately stent demonstrated reocclusion   PAIN:  Are you having pain? No 0/10  PRECAUTIONS: Shoulder and Fall, Aphasia   WEIGHT BEARING RESTRICTIONS No  FALLS: Has patient fallen in last 6 months? Yes. Number of falls 1, required 3-assist to recover  LIVING ENVIRONMENT: Lives with: lives with their spouse Lives in: House/apartment Stairs: Yes: Internal: flight steps; bilateral but cannot reach both and External: 4-5  steps; ramp is also present.  Has following equipment at home: Hemi walker, Wheelchair (manual), and sit-stand lift  PLOF: Independent  PATIENT GOALS regain independence  OBJECTIVE:    TODAY'S TREATMENT: 01/17/23 Activity Comments  Dynamic warm-up x 60 sec -sidestepping -march in place -weight shifting on foam  -lateral shift on foam  Pre-gait/gait -limb advancement over half bolster--facilitation of heel strike initial contact and hip extension for pre-swing -hip flexion 3x10 reps w/ 2.5# RLE -Gait training to improve reciprocal step and stride: rollator, weighted w/c, wide base SPC w/ manual contacts for trunk/hip extension at RLE heel strike  NU-step x 5 min -facilitation of RLE extension and hip position w/ alternating heavy resistance and then light w/ fast speed 90-100 SPM, able to achieve 400 steps!                Access Code: [email or web address] URL: [email or web address] Date: 12/28/2022 Prepared by: [facility]  Exercises - Forward Backward Weight Shift with Counter Support  - 1 x daily - 5 x weekly - 2 sets - 10 reps - Side to Side Weight Shift with Counter Support  - 1 x daily - 5 x weekly - 2 sets - 10 reps - Mini Squat with Counter Support  - 1 x daily - 5 x weekly - 2 sets - 10 reps - Stride Stance Weight Shift  - 1 x daily - 5 x weekly - 2 sets - 10 reps - Sit to stand in stride stance  - 2 x daily - 7 x weekly - 1-2 sets - 5 reps - Supine Bridge with Mini Swiss Ball Between Knees  - 1 x daily - 5 x weekly - 2 sets - 10 reps - Supine Heel Slide  - 1 x daily - 5 x weekly - 2 sets - 10 reps - Seated Isometric Hip Adduction with Ball  - 1 x daily - 5 x weekly - 2 sets - 10 reps - 3 sec hold - Alternating Step Taps with Counter Support  - 1 x daily - 5 x weekly - 1 sets - 3-5 reps - Side Stepping with Counter Support  - 1 x daily - 5 x weekly - 1 sets - 3 reps - Prone Knee Flexion  - 1 x daily - 7 x weekly - 3 sets - 10  reps - Tall Kneeling Hip Hinge  - 1 x daily - 7 x weekly - 3 sets - 10 reps    PATIENT EDUCATION: Education details: updates on HEP Person educated: Patient and Spouse Education method: Explanation, Demonstration, Tactile cues, Verbal cues, and Handouts Education comprehension: verbalized understanding  and returned demonstration      Below measures were taken at time of initial evaluation unless otherwise specified:     DIAGNOSTIC FINDINGS: Follow-up MRI brain done revealing small areas of acute infarct in left-MCA territory with mild hemorrhagic transformation in left parietal lobe and left-MCA bifurcation residual vascular thrombus   COGNITION: Overall cognitive status:  Difficult to assess due to aphasia   SENSATION: Light touch: Impaired  Proprioception: Impaired   COORDINATION: RLE impaired, some deficits on left foot with alternating movements and isolated movements  EDEMA:  None   MUSCLE TONE: RLE: Modifed Ashworth Scale 2 = More marked increase in muscle tone through most of the ROM, but affected part(s) easily moved right quad most affected, no clonus right plantarflexors   MUSCLE LENGTH: Able to achieve full right knee extension to PROM  DTRs:  Patella 3+ = Brisk  POSTURE: No Significant postural limitations  LOWER EXTREMITY ROM:     LLE WNL, RLE AROM limited by weakness  LOWER EXTREMITY MMT:    MMT Right Eval Left Eval  Hip flexion 3- 5  Hip extension    Hip abduction 3- 4  Hip adduction 3- 5  Hip internal rotation    Hip external rotation    Knee flexion 3 5  Knee extension 2+ 5  Ankle dorsiflexion 0 5  Ankle plantarflexion 1 3+  Ankle inversion    Ankle eversion    (Blank rows = not tested)  BED MOBILITY:  NT  TRANSFERS: Assistive device utilized: Hemi walker  Sit to stand: Modified independence Stand to sit: Modified independence Chair to chair: Modified independence Floor:  Total assist --mod A for half kneeling  RAMP:   Level of Assistance: CGA Assistive device utilized: Hemi walker Ramp Comments:   CURB:  Level of Assistance: Min A Assistive device utilized: Nutritional therapist Comments: cues in sequence  STAIRS:  Level of Assistance: SBA  Stair Negotiation Technique: Step to Pattern with Single Rail on Left  Number of Stairs: 15   Height of Stairs: 4-6"  Comments: cues and guarding  GAIT: Gait pattern: step to pattern, decreased ankle dorsiflexion- Right, and circumduction- Right Distance walked: 150 Assistive device utilized: Quad cane large base Level of assistance: Modified independence Comments: deficits during turning   FOTO 47%  TODAY'S TREATMENT:  assessment   PATIENT EDUCATION: Education details: assessment findings, PT scope of practice Person educated: Patient and Spouse Education method: Explanation Education comprehension: verbalized understanding     GOALS: Goals reviewed with patient? Yes  SHORT TERM GOALS: Target date: 01/13/2023      Patient will perform Advanced HEP with family/caregiver supervision for improved strength, balance, transfers, and gait  Baseline: indep with initial HEP Goal status: INITIAL  2.  Demo improved safety and ability to access home environment ambulating grass and uneven surfaces w/ SBA and least restrictive AD Baseline: CGA-min A grass/slopes/gravel Goal status: IN PROGRESS   4. Demonstrate improved gait speed to 1.8 ft/sec with right AFO and least restrictive AD to improve efficiency of gait  Baseline: 0.68 ft/sec at eval; (09/20/22) 1.59 ft/sec w/ platform RW and AFO; (12/02/22) 1.4 ft/sec w/ WBQC and AFO  Goal status: IN PROGRESS  5. Improve mobility and reduce level of assist performing floor to stand transfers w/ SBA  Baseline: max A  Goal status: IN PROGRESS  LONG TERM GOALS: Target date: 02/24/2023    Demonstrate improved functional status and quality of life meeting FOTO predicted outcomes (59% predicted) Baseline:  47% Goal  status:IN PROGRESS  2.  Demonstrate modified independent ambulation x 300 ft uneven surfaces using least restrictive AD in order to improve safety with home environment Baseline: CGA-min A w/ WBQC Goal status: IN PROGRESS  3.  Manifest improved safety with gait per time of 18 sec TUG test using least restrictive AD Baseline: 34.75 sec w/ HW; (09/20/22) 30.69 w/ HW and right AFO; (12/02/22) 23 sec w/ WBQC Goal status: IN PROGRESS   4. Stair ambulation x 15 steps with set-up assist to improve safety in home environment and reduce burden of care  Baseline: SBA with single HR, using AFO; (12/02/22) supervision  Goal status: On-going      ASSESSMENT:  CLINICAL IMPRESSION: Emphasis on gait training with techniques demo and reinforced to spouse/caregiver to facilitate RLE hip and trunk extension at initial contact to promote increased step length and foster reciprocal/continuous steps with good effect.  Responded very well to tactile input for applicable muscle action which helped maintain continuous, reciprocal steps 75% of the time with chief difficulty being sustained motion through turns.  Reinforced this training at end of session with NU-step and manual contacts to RLE to promote rapid, alternating movement. Continued sessions to indicated to progress STG/LTG and achieve modified independence in functional mobility under various demands  OBJECTIVE IMPAIRMENTS Abnormal gait, decreased activity tolerance, decreased balance, decreased coordination, decreased knowledge of use of DME, decreased mobility, difficulty walking, decreased strength, impaired sensation, impaired tone, impaired UE functional use, and improper body mechanics.   ACTIVITY LIMITATIONS carrying, lifting, bending, standing, squatting, stairs, transfers, bathing, toileting, dressing, reach over head, and locomotion level  PARTICIPATION LIMITATIONS: meal prep, cleaning, laundry, interpersonal relationship, driving,  shopping, community activity, occupation, yard work, and leisure activities  [place] Time since onset of injury/illness/exacerbation are also affecting patient's functional outcome.   REHAB POTENTIAL: Excellent  CLINICAL DECISION MAKING: Evolving/moderate complexity  EVALUATION COMPLEXITY: Moderate  PLAN: PT FREQUENCY: 1-2/wk  PT DURATION: 12 weeks  PLANNED INTERVENTIONS: Therapeutic exercises, Therapeutic activity, Neuromuscular re-education, Balance training, Gait training, Patient/Family education, Self Care, Joint mobilization, Stair training, Vestibular training, Canalith repositioning, Orthotic/Fit training, DME instructions, Aquatic Therapy, Dry Needling, Electrical stimulation, Spinal mobilization, Cryotherapy, Moist heat, Splintting, Taping, and Manual therapy  PLAN FOR NEXT SESSION: Check additions to HEP and try to add retro gait to HEP.  Outdoor quad cane gait training including ramps, curbs, trial SPC.  Hip ABD strengthening/flexion/extensors also, half kneeling with folded gym mat to progress floor to stand    8:08 AM, 01/17/23 M. Sherlyn Lees, PT, DPT Physical Therapist- [place] Office Number: [phone]

## 2023-01-25 NOTE — Progress Notes (Signed)
[facility] LOOP RECORDER 

## 2023-02-02 ENCOUNTER — Ambulatory Visit: Payer: 59 | Attending: Physical Medicine & Rehabilitation | Admitting: Occupational Therapy

## 2023-02-02 ENCOUNTER — Ambulatory Visit: Payer: 59

## 2023-02-02 DIAGNOSIS — R2689 Other abnormalities of gait and mobility: Secondary | ICD-10-CM | POA: Diagnosis present

## 2023-02-02 DIAGNOSIS — I69351 Hemiplegia and hemiparesis following cerebral infarction affecting right dominant side: Secondary | ICD-10-CM | POA: Diagnosis present

## 2023-02-02 DIAGNOSIS — R482 Apraxia: Secondary | ICD-10-CM | POA: Insufficient documentation

## 2023-02-02 DIAGNOSIS — R262 Difficulty in walking, not elsewhere classified: Secondary | ICD-10-CM | POA: Diagnosis present

## 2023-02-02 DIAGNOSIS — R278 Other lack of coordination: Secondary | ICD-10-CM | POA: Diagnosis present

## 2023-02-02 DIAGNOSIS — R4701 Aphasia: Secondary | ICD-10-CM | POA: Diagnosis present

## 2023-02-02 DIAGNOSIS — R2681 Unsteadiness on feet: Secondary | ICD-10-CM | POA: Insufficient documentation

## 2023-02-02 DIAGNOSIS — R208 Other disturbances of skin sensation: Secondary | ICD-10-CM | POA: Diagnosis present

## 2023-02-02 DIAGNOSIS — M6281 Muscle weakness (generalized): Secondary | ICD-10-CM

## 2023-02-02 NOTE — Therapy (Signed)
OUTPATIENT OCCUPATIONAL THERAPY NEURO TREATMENT NOTE  Patient Name: PRATHER LINENBERGER MRN: [id number] DOB:11/14/1960, 62 y.o., male Today's Date: 02/02/2023  PCP: none on file REFERRING PROVIDER: Jennye Boroughs, MD    OT End of Session - 02/02/23 1240     Visit Number 25    Number of Visits 30    Date for OT Re-Evaluation 02/25/23    Authorization Type [employer]    Authorization Time Period VL: 60 combined (OT/PT/ST)    Authorization - Number of Visits 20    OT Start Time 1107    OT Stop Time 1150    OT Time Calculation (min) 43 min    Activity Tolerance Patient tolerated treatment well    Behavior During Therapy WFL for tasks assessed/performed                            Past Medical History:  Diagnosis Date   [facility])    Past Surgical History:  Procedure Laterality Date   BUBBLE STUDY  02/15/2022   Procedure: BUBBLE STUDY;  Surgeon: Werner Lean, MD;  Location: [facility];  Service: Cardiovascular;;   IR CT HEAD LTD  02/11/2022   IR CT HEAD LTD  02/11/2022   IR CT HEAD LTD  02/11/2022   IR PERCUTANEOUS ART THROMBECTOMY/INFUSION INTRACRANIAL INC DIAG ANGIO  02/11/2022   IR PERCUTANEOUS ART THROMBECTOMY/INFUSION INTRACRANIAL INC DIAG ANGIO  02/11/2022   IR US GUIDE VASC ACCESS RIGHT  02/11/2022   IR US GUIDE VASC ACCESS RIGHT  02/11/2022   RADIOLOGY WITH ANESTHESIA N/A 02/11/2022   Procedure: IR WITH ANESTHESIA;  Surgeon: Radiologist, Medication, MD;  Location: [facility];  Service: Radiology;  Laterality: N/A;   RADIOLOGY WITH ANESTHESIA N/A 02/11/2022   Procedure: RADIOLOGY WITH ANESTHESIA;  Surgeon: Radiologist, Medication, MD;  Location: [facility];  Service: Radiology;  Laterality: N/A;   TEE WITHOUT CARDIOVERSION N/A 02/15/2022   Procedure: TRANSESOPHAGEAL ECHOCARDIOGRAM (TEE);  Surgeon: Werner Lean, MD;  Location: [facility];  Service: Cardiovascular;  Laterality: N/A;   Patient Active Problem List   Diagnosis Date Noted    Hypocalcemia 03/22/2022   DVT, lower extremity, distal 03/22/2022   Obesity 03/22/2022   Elevated LDL cholesterol level 03/22/2022   Acute ischemic left middle cerebral artery (MCA) stroke 02/18/2022   Cerebrovascular accident (CVA) due to occlusion of left middle cerebral artery 02/11/2022    ONSET DATE: referral 07/15/22 (CVA 02/11/22)  REFERRING DIAG: I63.9 (ICD-10-CM) - Cerebrovascular accident (CVA), unspecified mechanism ([place]) R13.10 (ICD-10-CM) - Dysphagia, unspecified type   THERAPY DIAG:  Hemiplegia and hemiparesis following cerebral infarction affecting right dominant side  Other lack of coordination  Other disturbances of skin sensation  Muscle weakness (generalized)  Rationale for Evaluation and Treatment Rehabilitation  SUBJECTIVE:   SUBJECTIVE STATEMENT: Pt's spouse asking questions about exercises to target tricep movements. 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 No  FALLS: Has patient fallen in last 6 months? Yes. Number of falls 1  PLOF: Independent, Independent with basic ADLs, and Independent with gait  PATIENT GOALS: continue to make gains, independence with getting dressed   OBJECTIVE:   HAND DOMINANCE: Right  FUNCTIONAL OUTCOME MEASURES: FOTO: 11  UPPER EXTREMITY ROM    Active ROM: Pt with no AROM during evaluation.  Pt was able to elicit  min shoulder elevation and slight gross grasp when cued and provided increased time to initiate movement.   09/22/22: Pt still with no AROM in upright position, is able to elicit slight internal rotation and mod elbow flexion/extension in gravity assisted position. [id number]: Pt eliciting trace shoulder flexion, 44* elbow flexion, -16* elbow extension, and able to elicit thumb extension with shoulder internal rotation.  Pt demonstrating ~30% of  scapular retraction and elevation with intermitent tactile cues.     Passive ROM Right Eval - 9/25 Right 11/23/22  Shoulder flexion 100 120  Shoulder abduction  95  Shoulder adduction    Shoulder extension    Shoulder internal rotation WNL Unable to tolerate reaching past torso towards back pocket  Shoulder external rotation onset of pain >5-10* from neutral Onset of pain > 30*  Middle trapezius    Lower trapezius    Elbow flexion WNL WNL  Elbow extension WNL WNL  Wrist flexion WNL WNL  Wrist extension WNL WNL  Wrist ulnar deviation    Wrist radial deviation    Wrist pronation    Wrist supination    (Blank rows = not tested)  HAND FUNCTION: 11/23/22: loose gross grasp with increased time for initiation, unable to release grasp  SENSATION: Difficult to assess due to expressive aphasia  MUSCLE TONE: RUE: Mild and Hypertonic  ------------------------------------------------------------------------------------------------------------------------------------------------------------ (objective measures above completed at initial evaluation unless otherwise dated)  TODAY'S TREATMENT: 02/02/23 NMES applied to supraspinatus and middle deltoid to help approximate shoulder joint to reduce sublux and reduce pain. Pt demonstrating slight activation at deltoid with NMES facilitating muscle quiver and minimal elevation. Attempted to elicit finger extension with electrodes placed on forearm extensors.  Pt demonstrating extension in thumb, index, and long finger this session.  Pt pleased to see and feel movement.  OT then facilitated increased challenge with encouraging pt to utilize stimulation and attempt to activate further extension, trace additional movement felt in thumb.  Pt with no c/o pain at beginning or end of session.  No adverse reactions after treatment and skin intact.   Ratio 1:2 Rate 35 pps Waveform- Asymmetric Ramp 1.0 Pulse 250 Intensity- 18-23 Duration -   8 min at  deltoid/supraspinatus and 8 mins at forearm extensors NMR: engaged in bicep activation in sitting with pt able to bend elbow to move block towards self but with difficulty moving arm away from body.  OT facilitating movement by providing tapping to triceps, however still unable to activate any movement in current position.  OT reiterated to pt and spouse to focus on triceps in supine and sidelying with elbow extension in supine, OT providing demonstration against gravity and with gravity minimized.    01/17/23 NMES: NMES applied to supraspinatus and middle deltoid to help approximate shoulder joint to reduce sublux and reduce pain. Pt demonstrating slight activation at deltoid with NMES however not enough to elicit movement.  Pt with no c/o pain at beginning or end of session.  No adverse reactions after treatment and skin intact.  Attempted to elicit finger extension with electrodes placed on forearm extensors.  Pt with no sensation to stimulation and no activation, therefore terminated task. Ratio 1:3 Rate 35 pps Waveform- Asymmetric Ramp 1.0 Pulse 300 Intensity- 180 volts Duration -   8 min at deltoid and 3 forearm extensors NMR: engaged in shoulder elevation and finger flexion with OT providing tactile cues and support.  Pt able to elicit finger flexion this session with increased time and effort, however unable to sustain grasp  on items and with inability to release grasp.     01/11/23 NMR: engaged in WB through RUE with OT providing facilitation and positioning through hand and elbow to facilitate increased WB for NMR.  Engaged in reaching across midline with LUE to further facilitate weight shifting through RUE.  Transitioned to WB through elbow with focus on transitioning down into sidelying and then pushing up through RUE to facilitate increased WB and motor recovery.  UE ranger: OT providing facilitation and tapping and tricep for increased forward reach and elbow extension.  OT  providing intermittent tactile cues under and behind elbow to decrease effects of gravity on activation of movement.  Pt demonstrating intermittent internal rotation with shoulder flexion, however with verbal cue pt able to correct.  Pt demonstrating increased elbow flexion/extension when providing verbal cue to "push" and "pull".   NMR: engaged in rolling ball forward/backward on leg with focus on motor control with elbow flexion/extension and shoulder flexion.  OT challenged pt to move ball on leg in circular pattern clockwise and counter clockwise.  Pt able to complete but with significant increased time and effort.   PATIENT EDUCATION: Ongoing condition-specific education related to therapeutic interventions completed this session Person educated: Patient and Spouse Education method: Explanation, Demonstration, Tactile cues, Verbal cues, and Handouts Education comprehension: verbalized understanding and needs further education   HOME EXERCISE PROGRAM: Written exercises of shoulder and elbow in supine with cues for hand placement and facilitation.   Self-ROM handout from [facility] lab  Access Code: [email or web address] URL: [email or web address] Date: 12/28/2022 Prepared by: [facility]  Exercises - Supine Shoulder Flexion AAROM with Hands Clasped  - 1 x daily - 1 sets - 10 reps - Supine Shoulder Abduction PROM with Caregiver  - 1 x daily - 10 reps - Supine Shoulder Abduction AAROM with Dowel  - 1 x daily - 3 x weekly - 10 reps - Seated Elbow Extension and Shoulder External Rotation AAROM at Table with Towel  - 1 x daily - 1 sets - 10 reps - Finger Extension with Wrist Extension Caregiver PROM  - 1 x daily - 1 sets - 10 reps - Seated Dowel Chest Press  - 3 x weekly - 1-2 sets - 10 reps - Seated Shoulder Flexion with Dowel to 90  - 3 x weekly - 1-2 sets - 10 reps  GOALS: Goals reviewed with patient? Yes  SHORT TERM GOALS:   12/24/22  Pt and spouse  will be independent with advanced PROM and AAROM HEP for shoulder stability and shoulder and elbow ROM. Baseline: Goal status: MET - 12/28/22  2.  Pt and spouse will verbalize understanding of AE and/or task modifications as needed for fastening shoes.  Baseline:  Goal status: IN PROGRESS   LONG TERM GOALS: Target date 02/25/23  1.  Pt will utilize RUE as gross assist during grooming tasks (such as washing hands and face). Baseline:  Goal status: IN PROGRESS  2.   Pt and spouse will verbalize and/or  demonstrate understanding of advanced treatment strategies and implementation at home (NMES) to facilitate increased motor recovery.  Baseline:  Goal status: IN PROGRESS  3.   Pt will demonstrate ability to utilize BUE during mobility tasks to pushup from chair with BUE on arm rests.  Baseline:  Goal status: IN PROGRESS   ASSESSMENT:  CLINICAL IMPRESSION: Treatment session with focus on NMR of RUE with and without use of handheld NMES.  Pt with increased results  with use of hand held [place] NMES with shoulder elevation and finger extension.  Pt's spouse to continue to assess online options and purchase as appropriate.  OT encouraged if purchasing to allow time to address in session to ensure proper settings and understanding.  Pt demonstrating bicep activation with elbow flexion, OT providing cues/exercises in supine to address elbow extension. Pt will continue to benefit from OT services to focus on making further planning for equipment management and advanced treatment strategies.    PERFORMANCE DEFICITS in functional skills including ADLs, IADLs, coordination, sensation, tone, ROM, strength, pain, flexibility, FMC, GMC, mobility, balance, body mechanics, vision, and UE functional use and psychosocial skills including environmental adaptation and routines and behaviors.   IMPAIRMENTS are limiting patient from ADLs and IADLs.   COMORBIDITIES may have co-morbidities  that affects  occupational performance. Patient will benefit from skilled OT to address above impairments and improve overall function.   PLAN: OT FREQUENCY: 1-2x/week  OT DURATION: 4 weeks  PLANNED INTERVENTIONS: self care/ADL training, therapeutic exercise, therapeutic activity, neuromuscular re-education, manual therapy, passive range of motion, balance training, functional mobility training, splinting, electrical stimulation, compression bandaging, moist heat, cryotherapy, patient/family education, visual/perceptual remediation/compensation, psychosocial skills training, energy conservation, coping strategies training, and DME and/or AE instructions  RECOMMENDED OTHER SERVICES: N/A  CONSULTED AND AGREED WITH PLAN OF CARE: Patient and family member/caregiver  PLAN FOR NEXT SESSION:  WB and dynamic standing, supine and sidelying NMR.  Increased focus on incorporation of RUE into functional tasks.  E-stim as cleared by MD.  Jaquelyn Bitter activation in supine and sidelying with or without NMES.   Simonne Come, OTR/L 02/02/2023, 12:41 PM

## 2023-02-02 NOTE — Therapy (Signed)
OUTPATIENT SPEECH LANGUAGE PATHOLOGY TREATMENT/RECERTIFICATION   Patient Name: Gordon Fisher MRN: [id number] DOB:10-01-1961, 62 y.o., male Today's Date: 02/02/2023  PCP: TBD REFERRING PROVIDER: Jennye Boroughs, MD    End of Session - 02/02/23 1238     Visit Number 22    Number of Visits 31    Date for SLP Re-Evaluation 03/05/23    SLP Start Time 1232    SLP Stop Time  [id number]    SLP Time Calculation (min) 43 min    Activity Tolerance Patient tolerated treatment well                         Past Medical History:  Diagnosis Date   Stroke [facility])    Past Surgical History:  Procedure Laterality Date   BUBBLE STUDY  02/15/2022   Procedure: BUBBLE STUDY;  Surgeon: Werner Lean, MD;  Location: [place];  Service: Cardiovascular;;   IR CT HEAD LTD  02/11/2022   IR CT HEAD LTD  02/11/2022   IR CT HEAD LTD  02/11/2022   IR PERCUTANEOUS ART THROMBECTOMY/INFUSION INTRACRANIAL INC DIAG ANGIO  02/11/2022   IR PERCUTANEOUS ART THROMBECTOMY/INFUSION INTRACRANIAL INC DIAG ANGIO  02/11/2022   IR US GUIDE VASC ACCESS RIGHT  02/11/2022   IR US GUIDE VASC ACCESS RIGHT  02/11/2022   RADIOLOGY WITH ANESTHESIA N/A 02/11/2022   Procedure: IR WITH ANESTHESIA;  Surgeon: Radiologist, Medication, MD;  Location: [place];  Service: Radiology;  Laterality: N/A;   RADIOLOGY WITH ANESTHESIA N/A 02/11/2022   Procedure: RADIOLOGY WITH ANESTHESIA;  Surgeon: Radiologist, Medication, MD;  Location: [place];  Service: Radiology;  Laterality: N/A;   TEE WITHOUT CARDIOVERSION N/A 02/15/2022   Procedure: TRANSESOPHAGEAL ECHOCARDIOGRAM (TEE);  Surgeon: Werner Lean, MD;  Location: [facility] ENDOSCOPY;  Service: Cardiovascular;  Laterality: N/A;   Patient Active Problem List   Diagnosis Date Noted   Hypocalcemia 03/22/2022   DVT, lower extremity, distal 03/22/2022   Obesity 03/22/2022   Elevated LDL cholesterol level 03/22/2022   Acute ischemic left middle cerebral artery (MCA) stroke  02/18/2022   Cerebrovascular accident (CVA) due to occlusion of left middle cerebral artery 02/11/2022    Speech Therapy Progress Note  Dates of Reporting Period: 11/23/22 to present  Subjective Statement: Pt has been seen for 22 ST sessions focusing mainly this reporting period on work with his speech generating device.  Objective: See below  Goal Update: See below  Plan: two more visits and then probable d/c.  Reason Skilled Services are Required: Ensure wife is proficient with creating and editing icons, and that pt maintains emerging knowledge of creating and editing icons.   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 CTH showed L-MCA hyperdense sign and he received TNK prior to transfer. CTA showed L-MCA MI occlusion and he underwent cerebral angio with thrombectomy and TICI 3 flow by Dr. Nelida Gores [name].  Follow up MRI brain done revealing small areas of acute infarct infarct  in [facility] territory with mild hemorrhagic transformation in left parietal lobe and L-MCA bifurcation residual vascular thrombus. He was taken back for thrombectomy with stent for dissection but had intra-stent occlusion which was unable to be recanalized. Post op CT showed small SAH which was stable on follow up. He was started on ASA/Brillinta. MBS 04/18 showed oral dysphagia with oral delay and no penetration or aspiration. He was started  on D3, thins with supervision. Repeat MRI brain on 04/16 showed extensive evolving acute ischemic L-MCA occlusion increased in size with associated edema, few additional small acute cortical subcortical infarcts involving contralateral right frontal lobe and left ACA distribution. Stroke felt to be due to unknown etiology and wife elected on Zio patch on discharge to rule out A fib. 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 Gordon Fisher was d/[facility]'d from [facility] on [facility]-19[facility]22, and has had HHST since that time until last week.   PAIN:  Are you having pain? No  PATIENT GOALS Pt indicated he would like talking to improve  OBJECTIVE:  DIAGNOSTIC FINDINGS: MRI without contrast 02/13/22 IMPRESSION: 1. Extensive evolving acute ischemic left MCA distribution infarct, increased in size as compared to previous MRI from 02/11/2022. Associated edema without significant midline shift. 2. Small volume acute subarachnoid hemorrhage at the posterior aspect of the left Sylvian fissure, stable. 3. Few additional small volume acute ischemic cortical and subcortical infarcts involving the contralateral right frontal lobe and left ACA distribution as above. 4. Underlying chronic microvascular ischemic disease.    PATIENT REPORTED OUTCOME MEASURES (PROM): Communication Effectiveness Survey: Pt scored 8/32 - higher scores indicate more effective communication/QOL.   TODAY'S TREATMENT:  02/02/23: Pt requires less cueing today with creating and editing icons - rare min verbal cues. Mod-max A still needed for spelling.  He told SLP and Gordon Fisher two vegetables he wanted to add to "garden" page. With 5/5 questions today pt drilled down to correct page without cues. Wife once had to cue pt to use device after multiple attempts saying son-in-law's name. SLP strongly urged pt/wife to use device daily as much as possible - keep device  open and available all day.  3/18/24[name] added some diagnoses to "health". SLP asked pt which diagnoses were pertinent for his speech difficulty and he req'd max A. Later in session he req'd extra time for success. Using Lingraphica, pt answered questions with 90% success and min A rarely to navigate to correct page. He req'd min A occasionally for editing an icon, and min A rarely for adding icon to a page. Pt demonstrated understanding of SLP simple questions with rare minA. Pt to cont at once/two weeks x2-3 more visits.   01/11/23: Pt did not schedule more visits so he will not decr to once every other week at this time. Likely to occur after next session on 01-17-23. SLP provided pt with rare min-mod A for editing icon procedure, and procedure for adding icon, and max A for typing icon name. Wife demonstrated modified independence (extra time) for these procedures. Pt answered simple questions x6 with Lingraphica with correct drill down technique 6/8 and improved to 8/8 with min A from SLP (written cue) for comprehension. SLP needed to cue [name] today to slow her rate of speech and simplify language when talking to pt.  01/04/23: Today SLP provided support for pt and Gordon Fisher using Linguraphica. SLP req'd to provide pt with mod cues for programming simple tasks (sound and  text) and how to navigate to edit page. SLP questions the extent pt is using the device at home. SLP told wife that pt should use the device at LEAST 1-2 times daily and to have Ama out and open so pt is encouraged to use it more. Pt to decr to once every other week next session.   12/28/22: Pt with new device. [name] now backing up regularly, after new icons/voices created. Today pt used Lingraphica independently for questions re: family and with occasional max A primarily for language comprehension for SLP question stimuli and then rare min A for pt's navigating to correct response. Once on the page pt activated correct icon  90%. Homework to have Gordon Fisher assist pt in generating 70% of new icons, she will do 30% herself for saving time. SLP reiterated eventually we want pt to program himslef but he needs practice to do that.   12/13/22: Pt with new device and loaner device present today. Plan was for SLP to take loaner and return to [place] however Gordon Fisher did not back up the loaner with all the farm icons and information she did since last session. SLP attempted this process which took 5 minutes but was unsuccessful. Pt req'd mod A-max A consistently with two-word icon names as they were recorded so SLP trained/re-educated pt on how to make changes to timing of icon names by putting period and a space between the two words. Fading cues necessary ultimately to min A occasionally. SLP strongly stressed to pt and wife that pt needs to USE the device at home and provided possible opportunities for him to do so.  Gordon Fisher to contact Lingraph[employer] and see how to get loaner backup onto pt's device , by next session. Three more sessions after today appear sufficient - pt/wife agree.  12/02/22: Pt entered room with device and indicated correctly the people who were at his house yesterday. He was independent with accessing vegetables he will grown in the spring. SLP assisted (mod A occasionally) pt in programming icon for "seed" and "leaf lettuce". Pt req'd mod -max cues for adding icons. Max/total A for spelling icon names.  11/23/22: Pt has not been seen in over one month - verbal and receptive language skills have minimally improved. Pt has not spontaneously used device at home - however has used device when (rarely) prompted by wife. [name] has programmed device a little with farm items and garden items. SLP assisted pt and wife to relocate icons to more proper location/s. Pt req'd initial demo cues faded to occasional mod cues for editing icons' pictures and sound. With text, pt req'd faded cues to occasional min cues.  Continue all goals  not met. Pt verbal expression improved dramatically when looking at picture and seeing icon name.   12/18/23[name] made some icons last week. Realized she had duplicated them when she saw them on another page and realized she had put some icons on the wrong page. Gordon Fisher is becoming more independent with deleting icons and is independent with moving icons up and down the page. He still requires occasional min A with adding and modifying icons but is improving with this as his exposure with the process continues. Pt currently without internet so he cannot search for icons or back up device at home.   10/11/22: SLP began educating pt/wife how to create icons using camera, altered pronunciation text field for "Gordon Fisher", and searching online for photo. By session end pt was accessing orange three-bar icon in upper right hand  corner independently when cued "what do you press to modify or add icons?", and moving icons independently. Pt and wife to make icons for family, and use for communicating food choices.  10-08-22: Lingraphica start trial date today. SLP introduced device to pt/wife. Pt with functional verbalization within 3 seconds of initiation for "ham", "tomato", "cheese", "Gordon Fisher", and "sandwich". Pt produced "sandwich" spontaneously after 8 seconds and mutliple attempts, without label. SLP introduced how to move, add, and delete icons. Pt communicated his sandwich choice ("sandwich meat" originally, and then "ham" after it was added to page), and toppings choices (tomato, cheese) with consistent min-mod cues, faded to occasional min-mod cues. Pt and wife to use device over the weekend for food - - SLP to assist/teach pt and [name] how to add pictures to icons for family members next session.  09/27/22: Lingraphica not yet sent from [facility]. Should be here by pt's next appointment.  Gordon Fisher told SLP names of people at their Thanksgiving celebration, with usual mod to max assist. Visual cues were most effective on pt's  most difficult words. SLP assessed pt's reading comprehension - 80% with 4-5 words.   11/8/23[name] stated they had been busy this week with appointments but still work at practicing some. SLP initiated a 20-minute discussion today with Evette Doffing, and SLP explaining speech generating device in depth, showing Gordon Fisher and [name] options for trial device and deciding on the Lingraphica Touch Talk. SLP to make referral for this in the next few days.  Family names were written with mod-max A usually - pt got first 3 letters correct with childrens and grandchildrens' names. SLP showed [name] how to break up daughter's name to Je[place]sick-kuh to encourage brain's processing in a different way in order to foster new neural pathways to say the name.  09/06/22: SLP targeted verbal expression with picture naming and divergent naming. Pt notably demonstrates he knows the word/s he wants to say but is finding it difficult to pronounce them (verbal apraxia). Pt's expression improves with visual cues (oral cues from SLP for initial phoneme/sound) - 75% successful even without sound necessary - just visual cue for articulator placement.   09/01/22: SLP worked with pt's verbal expression and auditory comprehension today. Simple conversation/sentences were understood 90% of the time, 100% with one repeat by speaker. With opposites/paired words, pt had 70% success, improved to 90% with min A. With cloze phrases/sentences pt's response accuracy improved with 1 syllable words, but overall success was 17% with min A, improved to 50% with mod-max A. Often, pt knew the response but knew he was going to misarticulate the response. Written cues were helpful for pt here. SLP encouraged wife to cont with cloze phrases and paired words at home.  10/25/23[name] brought pt's cards today and SLP reviewed them with pt, modleing to wife cueing methods and hierarchy.Pt functionally said 6/7 salient objects (without written words) with mod-max cues,  and 1/7 spontaneously. He said family names with written cues 6/7 (functionally), and 1/7 wth max cues Janett Billow"). Pt demo'd understnading of directions in context 75% today. SLP introduce possibility of Lingraphica to pt and wife and educated about studies proving that AAC assists verbal communication and that pt may not have to have assistance with a AAC device but we will know more as time progresses.     PATIENT EDUCATION: Education details: see "Today's therapy" Person educated: Patient and Spouse Education method: Explanation and Demonstration Education comprehension: verbalized understanding and needs further education   GOALS: Goals reviewed with  patient? Yes, in general  SHORT TERM GOALS: Target date: 08/31/2022 extended one week due to visits  Pt will name 6 salient family names when given picture and written name with consistent mod-max cues in 3 sessions (precursor for speech generating device) Baseline:08-25-22. 09-01-22 Goal status: partially met  2.  Pt will produce simple phoneme/ bilabial+/a/  /a/, /ba/, /ma/, and/or /pa/ simultaneously with SLP 65% success (equal # reps/stimulus) in 2 sessions Baseline:  Goal status: deferred - pt to focus on salient items/terms  3.  Pt will demo understanding of simple commands in context 80% over three sessions Baseline: 08-25-22, 09-01-22 Goal status: Partially met  4.  Pt will indicate understanding of a complex sentence 85% of the time in 3 sessions (precursor for speech generating device) Baseline:  Goal status: not met  5.  Pt will copy written family names with 50% success in two sessions  Baseline:  Goal Status: met   LONG TERM GOALS: Target date: 10/24/2022 , 02/01/23  Pt will name 6 salient family names when given picture and written name with consistent mod cues in 3 sessions (precursor for speech generating device) Baseline: 12/28/22, 01-04-23 Goal status: met  2.  Pt will IMITATE simple phoneme/bilabial+/a/  /a/,  /ba/, /ma/, and/or /pa/  65% success (equal # reps/stimulus) in 2 sessions Baseline:  Goal status: deferred - working with pt on more salient words  3.  Pt will generate meaningful message understood by Gordon Fisher using speech generating device 60% of the time in 3 sessions Baseline: 01/11/23, 01/17/23 Goal status: met  4.  Gordon Fisher will indicate that spontaneous communication is easier than during week of evaluation Baseline:  Goal status: Ongoing   ASSESSMENT:  CLINICAL IMPRESSION: Recert today. Gordon Fisher is a 62 y.o. male who cont to be seen today for treatment of verbal apraxia (as pt often indicates he knows the response but knows it will not be articulated correctly) and aphasia. Treatment involves use of pt's Lingraphica speech generating device (SGD). SEE TX NOTE from today for more details. Gordon Fisher used device independently today more than in any other session , so he has been practicing with the device at home since previous session. Likely d/c in 1-2 sessions.  OBJECTIVE IMPAIRMENTS include expressive language, receptive language, and aphasia. These impairments are limiting patient from return to work, managing medications, managing appointments, household responsibilities, ADLs/IADLs, and effectively communicating at home and in community. Factors affecting potential to achieve goals and functional outcome are ability to learn/carryover information and severity of impairments. Patient will benefit from skilled SLP services to address above impairments and improve overall function.  REHAB POTENTIAL: Good  PLAN: SLP FREQUENCY: 2x/week  SLP DURATION: 8 weeks  PLANNED INTERVENTIONS: Language facilitation, Environmental controls, Cueing hierachy, Internal/external aids, Functional tasks, and Multimodal communication approach    Jeweline Reif, CCC-SLP 02/02/2023, 1:53 PM

## 2023-02-02 NOTE — Therapy (Signed)
OUTPATIENT PHYSICAL THERAPY NEURO TREATMENT   Patient Name: Gordon Fisher MRN: [id number] DOB:1961-07-20, 62 y.o., male Today's Date: 02/02/2023   PCP: None listed REFERRING PROVIDER: Jennye Boroughs, MD       PT End of Session - 02/02/23 1155     Visit Number 27    Number of Visits 40    Date for PT Re-Evaluation 02/24/23    Authorization Type [employer] 2024-PT, OT, speech combined 80 visits    Authorization - Visit Number 8    Authorization - Number of Visits 30    PT Start Time 1145    PT Stop Time 1230    PT Time Calculation (min) 45 min    Activity Tolerance Patient tolerated treatment well    Behavior During Therapy WFL for tasks assessed/performed                       Past Medical History:  Diagnosis Date   Stroke [facility])    Past Surgical History:  Procedure Laterality Date   BUBBLE STUDY  02/15/2022   Procedure: BUBBLE STUDY;  Surgeon: Werner Lean, MD;  Location: [place];  Service: Cardiovascular;;   IR CT HEAD LTD  02/11/2022   IR CT HEAD LTD  02/11/2022   IR CT HEAD LTD  02/11/2022   IR PERCUTANEOUS ART THROMBECTOMY/INFUSION INTRACRANIAL INC DIAG ANGIO  02/11/2022   IR PERCUTANEOUS ART THROMBECTOMY/INFUSION INTRACRANIAL INC DIAG ANGIO  02/11/2022   IR US GUIDE VASC ACCESS RIGHT  02/11/2022   IR US GUIDE VASC ACCESS RIGHT  02/11/2022   RADIOLOGY WITH ANESTHESIA N/A 02/11/2022   Procedure: IR WITH ANESTHESIA;  Surgeon: Radiologist, Medication, MD;  Location: [place];  Service: Radiology;  Laterality: N/A;   RADIOLOGY WITH ANESTHESIA N/A 02/11/2022   Procedure: RADIOLOGY WITH ANESTHESIA;  Surgeon: Radiologist, Medication, MD;  Location: [place];  Service: Radiology;  Laterality: N/A;   TEE WITHOUT CARDIOVERSION N/A 02/15/2022   Procedure: TRANSESOPHAGEAL ECHOCARDIOGRAM (TEE);  Surgeon: Werner Lean, MD;  Location: [facility] ENDOSCOPY;  Service: Cardiovascular;  Laterality: N/A;   Patient Active Problem List   Diagnosis Date Noted    Hypocalcemia 03/22/2022   DVT, lower extremity, distal 03/22/2022   Obesity 03/22/2022   Elevated LDL cholesterol level 03/22/2022   Acute ischemic left middle cerebral artery (MCA) stroke 02/18/2022   Cerebrovascular accident (CVA) due to occlusion of left middle cerebral artery 02/11/2022    ONSET DATE: April 2023  REFERRING DIAG: I63.9 (ICD-10-CM) - Cerebrovascular accident (CVA), unspecified mechanism ([place]) R13.10 (ICD-10-CM) - Dysphagia, unspecified type   THERAPY DIAG:  Muscle weakness (generalized)  Hemiplegia and hemiparesis following cerebral infarction affecting right dominant side  Unsteadiness on feet  Other abnormalities of gait and mobility  Difficulty in walking, not elsewhere classified  Rationale for Evaluation and Treatment Rehabilitation  SUBJECTIVE:  SUBJECTIVE STATEMENT:  Working w/ sister in Sports coach at home for mobility and exercise  Pt accompanied by: significant other  PERTINENT HISTORY: 02/11/2022 patient developed right-sided weakness, aphasia, went to hospital for evaluation.  Code stroke was activated.  He received TNK and then transferred to [facility].  He underwent cerebral angiogram with thrombectomy.  Unfortunately vessel reoccluded later the day and the second thrombectomy was attempted.  Left M2 dissection was identified and treated with stent but unfortunately stent demonstrated reocclusion   PAIN:  Are you having pain? No 0/10  PRECAUTIONS: Shoulder and Fall, Aphasia   WEIGHT BEARING RESTRICTIONS No  FALLS: Has patient fallen in last 6 months? Yes. Number of falls 1, required 3-assist to recover  LIVING ENVIRONMENT: Lives with: lives with their spouse Lives in: House/apartment Stairs: Yes: Internal: flight steps; bilateral but cannot reach both  and External: 4-5 steps; ramp is also present.  Has following equipment at home: Hemi walker, Wheelchair (manual), and sit-stand lift  PLOF: Independent  PATIENT GOALS regain independence  OBJECTIVE:   TODAY'S TREATMENT: 02/02/23 Activity Comments  Stair training Practice with LLE reciprocal advancement--therapist facilitating right knee/hip extension to promote LLE advancement 4x10  balance -LLE elevated on Bosu for forced RLE single leg stance 5x10 sec rounds--difficulty in maintaining trunk extension due to weakness  Sidelying hip flexion 5x10 10-15# with cable resistance and therapist facilitating limb position  Seated hamstring curls 4x10 10# with therapist pressure on right quad to inhibit quad tone to facilitate isolated knee flexion           Access Code: [email or web address] URL: [email or web address] Date: 12/28/2022 Prepared by: [facility]  Exercises - Forward Backward Weight Shift with Counter Support  - 1 x daily - 5 x weekly - 2 sets - 10 reps - Side to Side Weight Shift with Counter Support  - 1 x daily - 5 x weekly - 2 sets - 10 reps - Mini Squat with Counter Support  - 1 x daily - 5 x weekly - 2 sets - 10 reps - Stride Stance Weight Shift  - 1 x daily - 5 x weekly - 2 sets - 10 reps - Sit to stand in stride stance  - 2 x daily - 7 x weekly - 1-2 sets - 5 reps - Supine Bridge with Mini Swiss Ball Between Knees  - 1 x daily - 5 x weekly - 2 sets - 10 reps - Supine Heel Slide  - 1 x daily - 5 x weekly - 2 sets - 10 reps - Seated Isometric Hip Adduction with Ball  - 1 x daily - 5 x weekly - 2 sets - 10 reps - 3 sec hold - Alternating Step Taps with Counter Support  - 1 x daily - 5 x weekly - 1 sets - 3-5 reps - Side Stepping with Counter Support  - 1 x daily - 5 x weekly - 1 sets - 3 reps - Prone Knee Flexion  - 1 x daily - 7 x weekly - 3 sets - 10 reps - Tall Kneeling Hip Hinge  - 1 x daily - 7 x weekly - 3 sets - 10  reps    PATIENT EDUCATION: Education details: updates on HEP Person educated: Patient and Spouse Education method: Explanation, Demonstration, Tactile cues, Verbal cues, and Handouts Education comprehension: verbalized understanding and returned demonstration      Below measures were taken at time of initial evaluation unless otherwise  specified:     DIAGNOSTIC FINDINGS: Follow-up MRI brain done revealing small areas of acute infarct in left-MCA territory with mild hemorrhagic transformation in left parietal lobe and left-MCA bifurcation residual vascular thrombus   COGNITION: Overall cognitive status:  Difficult to assess due to aphasia   SENSATION: Light touch: Impaired  Proprioception: Impaired   COORDINATION: RLE impaired, some deficits on left foot with alternating movements and isolated movements  EDEMA:  None   MUSCLE TONE: RLE: Modifed Ashworth Scale 2 = More marked increase in muscle tone through most of the ROM, but affected part(s) easily moved right quad most affected, no clonus right plantarflexors   MUSCLE LENGTH: Able to achieve full right knee extension to PROM  DTRs:  Patella 3+ = Brisk  POSTURE: No Significant postural limitations  LOWER EXTREMITY ROM:     LLE WNL, RLE AROM limited by weakness  LOWER EXTREMITY MMT:    MMT Right Eval Left Eval  Hip flexion 3- 5  Hip extension    Hip abduction 3- 4  Hip adduction 3- 5  Hip internal rotation    Hip external rotation    Knee flexion 3 5  Knee extension 2+ 5  Ankle dorsiflexion 0 5  Ankle plantarflexion 1 3+  Ankle inversion    Ankle eversion    (Blank rows = not tested)  BED MOBILITY:  NT  TRANSFERS: Assistive device utilized: Hemi walker  Sit to stand: Modified independence Stand to sit: Modified independence Chair to chair: Modified independence Floor:  Total assist --mod A for half kneeling  RAMP:  Level of Assistance: CGA Assistive device utilized: Hemi walker Ramp  Comments:   CURB:  Level of Assistance: Min A Assistive device utilized: Nutritional therapist Comments: cues in sequence  STAIRS:  Level of Assistance: SBA  Stair Negotiation Technique: Step to Pattern with Single Rail on Left  Number of Stairs: 15   Height of Stairs: 4-6"  Comments: cues and guarding  GAIT: Gait pattern: step to pattern, decreased ankle dorsiflexion- Right, and circumduction- Right Distance walked: 150 Assistive device utilized: Quad cane large base Level of assistance: Modified independence Comments: deficits during turning   FOTO 47%  TODAY'S TREATMENT:  assessment   PATIENT EDUCATION: Education details: assessment findings, PT scope of practice Person educated: Patient and Spouse Education method: Explanation Education comprehension: verbalized understanding     GOALS: Goals reviewed with patient? Yes  SHORT TERM GOALS: Target date: 01/13/2023      Patient will perform Advanced HEP with family/caregiver supervision for improved strength, balance, transfers, and gait  Baseline: indep with initial HEP Goal status: INITIAL  2.  Demo improved safety and ability to access home environment ambulating grass and uneven surfaces w/ SBA and least restrictive AD Baseline: CGA-min A grass/slopes/gravel Goal status: IN PROGRESS   4. Demonstrate improved gait speed to 1.8 ft/sec with right AFO and least restrictive AD to improve efficiency of gait  Baseline: 0.68 ft/sec at eval; (09/20/22) 1.59 ft/sec w/ platform RW and AFO; (12/02/22) 1.4 ft/sec w/ WBQC and AFO  Goal status: IN PROGRESS  5. Improve mobility and reduce level of assist performing floor to stand transfers w/ SBA  Baseline: max A  Goal status: IN PROGRESS  LONG TERM GOALS: Target date: 02/24/2023    Demonstrate improved functional status and quality of life meeting FOTO predicted outcomes (59% predicted) Baseline: 47% Goal status:IN PROGRESS  2.  Demonstrate modified independent  ambulation x 300 ft uneven surfaces using least restrictive AD in  order to improve safety with home environment Baseline: CGA-min A w/ WBQC Goal status: IN PROGRESS  3.  Manifest improved safety with gait per time of 18 sec TUG test using least restrictive AD Baseline: 34.75 sec w/ HW; (09/20/22) 30.69 w/ HW and right AFO; (12/02/22) 23 sec w/ WBQC Goal status: IN PROGRESS   4. Stair ambulation x 15 steps with set-up assist to improve safety in home environment and reduce burden of care  Baseline: SBA with single HR, using AFO; (12/02/22) supervision  Goal status: On-going      ASSESSMENT:  CLINICAL IMPRESSION: Skilled handling to facilitate RLE recruitment to facilitate reciprocal stair ambulation and then techniques to facilitate right hip flexion to improve strength and stability as well as enhance ability to lift limb to clear obstacles and step placement. Demonstrated use of deep pressure for tone inhibition to patient's spouse. Demo lack of reciprocal inhibition between right quad and hamstrings, deep pressure facilitated improved isolated knee flexion control. Patient would benefit from continued sessions to advance POC details and provide relevant pt/caregiver education to institute techniques for home practice to further improve functional status  OBJECTIVE IMPAIRMENTS Abnormal gait, decreased activity tolerance, decreased balance, decreased coordination, decreased knowledge of use of DME, decreased mobility, difficulty walking, decreased strength, impaired sensation, impaired tone, impaired UE functional use, and improper body mechanics.   ACTIVITY LIMITATIONS carrying, lifting, bending, standing, squatting, stairs, transfers, bathing, toileting, dressing, reach over head, and locomotion level  PARTICIPATION LIMITATIONS: meal prep, cleaning, laundry, interpersonal relationship, driving, shopping, community activity, occupation, yard work, and leisure activities  [place] Time  since onset of injury/illness/exacerbation are also affecting patient's functional outcome.   REHAB POTENTIAL: Excellent  CLINICAL DECISION MAKING: Evolving/moderate complexity  EVALUATION COMPLEXITY: Moderate  PLAN: PT FREQUENCY: 1-2/wk  PT DURATION: 12 weeks  PLANNED INTERVENTIONS: Therapeutic exercises, Therapeutic activity, Neuromuscular re-education, Balance training, Gait training, Patient/Family education, Self Care, Joint mobilization, Stair training, Vestibular training, Canalith repositioning, Orthotic/Fit training, DME instructions, Aquatic Therapy, Dry Needling, Electrical stimulation, Spinal mobilization, Cryotherapy, Moist heat, Splintting, Taping, and Manual therapy  PLAN FOR NEXT SESSION: Check additions to HEP and try to add retro gait to HEP.  Outdoor quad cane gait training including ramps, curbs, trial SPC.  Hip ABD strengthening/flexion/extensors also, half kneeling with folded gym mat to progress floor to stand    12:23 PM, 02/02/23 M. Sherlyn Lees, PT, DPT Physical Therapist- [place] Office Number: [phone]

## 2023-02-14 ENCOUNTER — Ambulatory Visit (INDEPENDENT_AMBULATORY_CARE_PROVIDER_SITE_OTHER): Payer: 59

## 2023-02-14 DIAGNOSIS — I639 Cerebral infarction, unspecified: Secondary | ICD-10-CM | POA: Diagnosis not present

## 2023-02-14 LAB — CUP PACEART REMOTE DEVICE CHECK
Date Time Interrogation Session: 20240413103428
Implantable Pulse Generator Implant Date: 20230807
Pulse Gen Serial Number: 511016023

## 2023-02-15 NOTE — Progress Notes (Signed)
[facility] Summary Report / Loop Recorder 

## 2023-02-16 ENCOUNTER — Ambulatory Visit: Payer: 59

## 2023-02-16 ENCOUNTER — Ambulatory Visit: Payer: 59 | Admitting: Occupational Therapy

## 2023-02-16 DIAGNOSIS — R4701 Aphasia: Secondary | ICD-10-CM

## 2023-02-16 DIAGNOSIS — I69351 Hemiplegia and hemiparesis following cerebral infarction affecting right dominant side: Secondary | ICD-10-CM

## 2023-02-16 DIAGNOSIS — R2681 Unsteadiness on feet: Secondary | ICD-10-CM

## 2023-02-16 DIAGNOSIS — R2689 Other abnormalities of gait and mobility: Secondary | ICD-10-CM

## 2023-02-16 DIAGNOSIS — R208 Other disturbances of skin sensation: Secondary | ICD-10-CM

## 2023-02-16 DIAGNOSIS — R262 Difficulty in walking, not elsewhere classified: Secondary | ICD-10-CM

## 2023-02-16 DIAGNOSIS — R278 Other lack of coordination: Secondary | ICD-10-CM

## 2023-02-16 DIAGNOSIS — M6281 Muscle weakness (generalized): Secondary | ICD-10-CM

## 2023-02-16 DIAGNOSIS — R482 Apraxia: Secondary | ICD-10-CM

## 2023-02-16 NOTE — Therapy (Signed)
OUTPATIENT PHYSICAL THERAPY NEURO TREATMENT   Patient Name: Gordon Fisher MRN: [id number] DOB:1960-12-26, 62 y.o., male Today's Date: 02/16/2023   PCP: None listed REFERRING PROVIDER: Fanny Dance, MD       PT End of Session - 02/16/23 1402     Visit Number 28    Number of Visits 40    Date for PT Re-Evaluation 02/24/23    Authorization Type [employer] 2024-PT, OT, speech combined 60 visits    Authorization - Visit Number 9    Authorization - Number of Visits 30    PT Start Time 1400    PT Stop Time 1445    PT Time Calculation (min) 45 min    Activity Tolerance Patient tolerated treatment well    Behavior During Therapy WFL for tasks assessed/performed                       Past Medical History:  Diagnosis Date   Stroke    Past Surgical History:  Procedure Laterality Date   BUBBLE STUDY  02/15/2022   Procedure: BUBBLE STUDY;  Surgeon: Christell Constant, MD;  Location: [facility];  Service: Cardiovascular;;   IR CT HEAD LTD  02/11/2022   IR CT HEAD LTD  02/11/2022   IR CT HEAD LTD  02/11/2022   IR PERCUTANEOUS ART THROMBECTOMY/INFUSION INTRACRANIAL INC DIAG ANGIO  02/11/2022   IR PERCUTANEOUS ART THROMBECTOMY/INFUSION INTRACRANIAL INC DIAG ANGIO  02/11/2022   IR US GUIDE VASC ACCESS RIGHT  02/11/2022   IR US GUIDE VASC ACCESS RIGHT  02/11/2022   RADIOLOGY WITH ANESTHESIA N/A 02/11/2022   Procedure: IR WITH ANESTHESIA;  Surgeon: Radiologist, Medication, MD;  Location: [facility] OR;  Service: Radiology;  Laterality: N/A;   RADIOLOGY WITH ANESTHESIA N/A 02/11/2022   Procedure: RADIOLOGY WITH ANESTHESIA;  Surgeon: Radiologist, Medication, MD;  Location: [facility] OR;  Service: Radiology;  Laterality: N/A;   TEE WITHOUT CARDIOVERSION N/A 02/15/2022   Procedure: TRANSESOPHAGEAL ECHOCARDIOGRAM (TEE);  Surgeon: Christell Constant, MD;  Location: [facility] ENDOSCOPY;  Service: Cardiovascular;  Laterality: N/A;   Patient Active Problem List   Diagnosis Date Noted   Hypocalcemia  03/22/2022   DVT, lower extremity, distal 03/22/2022   Obesity 03/22/2022   Elevated LDL cholesterol level 03/22/2022   Acute ischemic left middle cerebral artery (MCA) stroke 02/18/2022   Cerebrovascular accident (CVA) due to occlusion of left middle cerebral artery 02/11/2022    ONSET DATE: April 2023  REFERRING DIAG: I63.9 (ICD-10-CM) - Cerebrovascular accident (CVA), unspecified mechanism (HCC) R13.10 (ICD-10-CM) - Dysphagia, unspecified type   THERAPY DIAG:  Muscle weakness (generalized)  Hemiplegia and hemiparesis following cerebral infarction affecting right dominant side  Unsteadiness on feet  Other abnormalities of gait and mobility  Difficulty in walking, not elsewhere classified  Rationale for Evaluation and Treatment Rehabilitation  SUBJECTIVE:  SUBJECTIVE STATEMENT:  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  Code stroke was activated.  He received TNK and then transferred to [facility].  He underwent cerebral angiogram with thrombectomy.  Unfortunately vessel reoccluded later the day and the second thrombectomy was attempted.  Left M2 dissection was identified and treated with stent but unfortunately stent demonstrated reocclusion   PAIN:  Are you having pain? No 0/10  PRECAUTIONS: Shoulder and Fall, Aphasia   WEIGHT BEARING RESTRICTIONS No  FALLS: Has patient fallen in last 6 months? Yes. Number of falls 1, required 3-assist to recover  LIVING ENVIRONMENT: Lives with: lives with their spouse Lives in: House/apartment Stairs: Yes: Internal: flight steps; bilateral but cannot reach both and External: 4-5 steps;  ramp is also present.  Has following equipment at home: Hemi walker, Wheelchair (manual), and sit-stand lift  PLOF: Independent  PATIENT GOALS regain independence  OBJECTIVE:   TODAY'S TREATMENT: 02/16/23 Activity Comments  Seated right hamstring curls 1x5 AAROM 1x5 5# (limited ROM) 1x5 15# negative 1x5 10# negative 1x5 15# negative --coupled with tapping of muscle belly and cues in breathing control--improved eccentric with exhalation coordination  Seated right hip flexion  1x5 5# ---awkward  Supine hip flexion RLE w/ cable machine 3x10 10#  Step-ups RLE 3x10 12" box, therapist facilitation for knee mechanics  Gait training -resisted walking for RLE push-off power -retrowalk with assist to RLE for hip extension  -ambulation w/out AD and tactile cues for RLE mechanics  Push-release 5x ant/post for righting reactions     TODAY'S TREATMENT: 02/02/23 Activity Comments  Stair training Practice with LLE reciprocal advancement--therapist facilitating right knee/hip extension to promote LLE advancement 4x10  balance -LLE elevated on Bosu for forced RLE single leg stance 5x10 sec rounds--difficulty in maintaining trunk extension due to weakness  Sidelying hip flexion 5x10 10-15# with cable resistance and therapist facilitating limb position  Seated hamstring curls 4x10 10# with therapist pressure on right quad to inhibit quad tone to facilitate isolated knee flexion           Access Code: [email or web address] URL: [email or web address] Date: 12/28/2022 Prepared by: [facility] - Outpatient  Rehab - [facility]  Exercises - Forward Backward Weight Shift with Counter Support  - 1 x daily - 5 x weekly - 2 sets - 10 reps - Side to Side Weight Shift with Counter Support  - 1 x daily - 5 x weekly - 2 sets - 10 reps - Mini Squat with Counter Support  - 1 x daily - 5 x weekly - 2 sets - 10 reps - Stride Stance Weight Shift  - 1 x daily - 5 x weekly - 2 sets - 10 reps - Sit to stand in  stride stance  - 2 x daily - 7 x weekly - 1-2 sets - 5 reps - Supine Bridge with Mini Swiss Ball Between Knees  - 1 x daily - 5 x weekly - 2 sets - 10 reps - Supine Heel Slide  - 1 x daily - 5 x weekly - 2 sets - 10 reps - Seated Isometric Hip Adduction with Ball  - 1 x daily - 5 x weekly - 2 sets - 10 reps - 3 sec hold - Alternating Step Taps with Counter Support  - 1 x daily - 5 x weekly - 1 sets - 3-5 reps - Side  Stepping with Counter Support  - 1 x daily - 5 x weekly - 1 sets - 3 reps - Prone Knee Flexion  - 1 x daily - 7 x weekly - 3 sets - 10 reps - Tall Kneeling Hip Hinge  - 1 x daily - 7 x weekly - 3 sets - 10 reps    PATIENT EDUCATION: Education details: updates on HEP Person educated: Patient and Spouse Education method: Explanation, Demonstration, Tactile cues, Verbal cues, and Handouts Education comprehension: verbalized understanding and returned demonstration      Below measures were taken at time of initial evaluation unless otherwise specified:     DIAGNOSTIC FINDINGS: Follow-up MRI brain done revealing small areas of acute infarct in left-MCA territory with mild hemorrhagic transformation in left parietal lobe and left-MCA bifurcation residual vascular thrombus   COGNITION: Overall cognitive status:  Difficult to assess due to aphasia   SENSATION: Light touch: Impaired  Proprioception: Impaired   COORDINATION: RLE impaired, some deficits on left foot with alternating movements and isolated movements  EDEMA:  None   MUSCLE TONE: RLE: Modifed Ashworth Scale 2 = More marked increase in muscle tone through most of the ROM, but affected part(s) easily moved right quad most affected, no clonus right plantarflexors   MUSCLE LENGTH: Able to achieve full right knee extension to PROM  DTRs:  Patella 3+ = Brisk  POSTURE: No Significant postural limitations  LOWER EXTREMITY ROM:     LLE WNL, RLE AROM limited by weakness  LOWER EXTREMITY MMT:    MMT  Right Eval Left Eval  Hip flexion 3- 5  Hip extension    Hip abduction 3- 4  Hip adduction 3- 5  Hip internal rotation    Hip external rotation    Knee flexion 3 5  Knee extension 2+ 5  Ankle dorsiflexion 0 5  Ankle plantarflexion 1 3+  Ankle inversion    Ankle eversion    (Blank rows = not tested)  BED MOBILITY:  NT  TRANSFERS: Assistive device utilized: Hemi walker  Sit to stand: Modified independence Stand to sit: Modified independence Chair to chair: Modified independence Floor:  Total assist --mod A for half kneeling  RAMP:  Level of Assistance: CGA Assistive device utilized: Hemi walker Ramp Comments:   CURB:  Level of Assistance: Min A Assistive device utilized: Education officer, environmental Comments: cues in sequence  STAIRS:  Level of Assistance: SBA  Stair Negotiation Technique: Step to Pattern with Single Rail on Left  Number of Stairs: 15   Height of Stairs: 4-6"  Comments: cues and guarding  GAIT: Gait pattern: step to pattern, decreased ankle dorsiflexion- Right, and circumduction- Right Distance walked: 150 Assistive device utilized: Quad cane large base Level of assistance: Modified independence Comments: deficits during turning   FOTO 47%  TODAY'S TREATMENT:  assessment   PATIENT EDUCATION: Education details: assessment findings, PT scope of practice Person educated: Patient and Spouse Education method: Explanation Education comprehension: verbalized understanding     GOALS: Goals reviewed with patient? Yes  SHORT TERM GOALS: Target date: 01/13/2023      Patient will perform Advanced HEP with family/caregiver supervision for improved strength, balance, transfers, and gait  Baseline: indep with initial HEP Goal status: INITIAL  2.  Demo improved safety and ability to access home environment ambulating grass and uneven surfaces w/ SBA and least restrictive AD Baseline: CGA-min A grass/slopes/gravel Goal status: IN PROGRESS   4.  Demonstrate improved gait speed to 1.8 ft/sec with right AFO  and least restrictive AD to improve efficiency of gait  Baseline: 0.68 ft/sec at eval; (09/20/22) 1.59 ft/sec w/ platform RW and AFO; (12/02/22) 1.4 ft/sec w/ WBQC and AFO  Goal status: IN PROGRESS  5. Improve mobility and reduce level of assist performing floor to stand transfers w/ SBA  Baseline: max A  Goal status: IN PROGRESS  LONG TERM GOALS: Target date: 02/24/2023    Demonstrate improved functional status and quality of life meeting FOTO predicted outcomes (59% predicted) Baseline: 47% Goal status:IN PROGRESS  2.  Demonstrate modified independent ambulation x 300 ft uneven surfaces using least restrictive AD in order to improve safety with home environment Baseline: CGA-min A w/ WBQC Goal status: IN PROGRESS  3.  Manifest improved safety with gait per time of 18 sec TUG test using least restrictive AD Baseline: 34.75 sec w/ HW; (09/20/22) 30.69 w/ HW and right AFO; (12/02/22) 23 sec w/ WBQC Goal status: IN PROGRESS   4. Stair ambulation x 15 steps with set-up assist to improve safety in home environment and reduce burden of care  Baseline: SBA with single HR, using AFO; (12/02/22) supervision  Goal status: On-going      ASSESSMENT:  CLINICAL IMPRESSION: Initiated with heavy resistance and eccentrics for RLE control and power development with therapist assisting in concentric phase and cues for sequencing/control to excellent effect for hamstring and hip flexion facilitation.  Improved RLE power evident by performance of 12" step-ups with good quad control for concentric and eccentric phase. Difficulty with coordination in walking and delivering RLE power for push-off against external resistance and difficulty in achieving strong right hip extension, although some improvement with tactile cues to glutes appreciated with terminal stance/pre-swing. Continued sessions to progress rehabilitation and improve RLE power/force  production to enable safe ambulation practices in community and improving mobility  OBJECTIVE IMPAIRMENTS Abnormal gait, decreased activity tolerance, decreased balance, decreased coordination, decreased knowledge of use of DME, decreased mobility, difficulty walking, decreased strength, impaired sensation, impaired tone, impaired UE functional use, and improper body mechanics.   ACTIVITY LIMITATIONS carrying, lifting, bending, standing, squatting, stairs, transfers, bathing, toileting, dressing, reach over head, and locomotion level  PARTICIPATION LIMITATIONS: meal prep, cleaning, laundry, interpersonal relationship, driving, shopping, community activity, occupation, yard work, and leisure activities  PERSONAL FACTORS Time since onset of injury/illness/exacerbation are also affecting patient's functional outcome.   REHAB POTENTIAL: Excellent  CLINICAL DECISION MAKING: Evolving/moderate complexity  EVALUATION COMPLEXITY: Moderate  PLAN: PT FREQUENCY: 1-2/wk  PT DURATION: 12 weeks  PLANNED INTERVENTIONS: Therapeutic exercises, Therapeutic activity, Neuromuscular re-education, Balance training, Gait training, Patient/Family education, Self Care, Joint mobilization, Stair training, Vestibular training, Canalith repositioning, Orthotic/Fit training, DME instructions, Aquatic Therapy, Dry Needling, Electrical stimulation, Spinal mobilization, Cryotherapy, Moist heat, Splintting, Taping, and Manual therapy  PLAN FOR NEXT SESSION: Check additions to HEP and try to add retro gait to HEP.  Outdoor quad cane gait training including ramps, curbs, trial SPC.  Hip ABD strengthening/flexion/extensors also, half kneeling with folded gym mat to progress floor to stand    2:02 PM, 02/16/23 M. Shary Decamp, PT, DPT Physical Therapist- [place] Office Number: [phone]

## 2023-02-16 NOTE — Therapy (Signed)
OUTPATIENT SPEECH LANGUAGE PATHOLOGY TREATMENT/Discharge summary   Patient Name: Gordon Fisher MRN: [id number] DOB:08/17/1961, 62 y.o., male Today's Date: 02/16/2023  PCP: TBD REFERRING PROVIDER: Fanny Dance, Fisher    End of Session - 02/16/23 1240     Visit Number 23    Number of Visits 31    Date for SLP Re-Evaluation 03/05/23    SLP Start Time 1240    SLP Stop Time  1315    SLP Time Calculation (min) 35 min    Activity Tolerance Patient tolerated treatment well                          Past Medical History:  Diagnosis Date   Stroke    Past Surgical History:  Procedure Laterality Date   BUBBLE STUDY  02/15/2022   Procedure: BUBBLE STUDY;  Surgeon: Gordon Constant, Fisher;  Location: [facility];  Service: Cardiovascular;;   IR CT HEAD LTD  02/11/2022   IR CT HEAD LTD  02/11/2022   IR CT HEAD LTD  02/11/2022   IR PERCUTANEOUS ART THROMBECTOMY/INFUSION INTRACRANIAL INC DIAG ANGIO  02/11/2022   IR PERCUTANEOUS ART THROMBECTOMY/INFUSION INTRACRANIAL INC DIAG ANGIO  02/11/2022   IR US GUIDE VASC ACCESS RIGHT  02/11/2022   IR US GUIDE VASC ACCESS RIGHT  02/11/2022   RADIOLOGY WITH ANESTHESIA N/A 02/11/2022   Procedure: IR WITH ANESTHESIA;  Surgeon: Gordon Fisher;  Location: [facility] OR;  Service: Radiology;  Laterality: N/A;   RADIOLOGY WITH ANESTHESIA N/A 02/11/2022   Procedure: RADIOLOGY WITH ANESTHESIA;  Surgeon: Gordon Fisher;  Location: [facility] OR;  Service: Radiology;  Laterality: N/A;   TEE WITHOUT CARDIOVERSION N/A 02/15/2022   Procedure: TRANSESOPHAGEAL ECHOCARDIOGRAM (TEE);  Surgeon: Gordon Constant, Fisher;  Location: [facility] ENDOSCOPY;  Service: Cardiovascular;  Laterality: N/A;   Patient Active Problem List   Diagnosis Date Noted   Hypocalcemia 03/22/2022   DVT, lower extremity, distal 03/22/2022   Obesity 03/22/2022   Elevated LDL cholesterol level 03/22/2022   Acute ischemic left middle cerebral artery (MCA) stroke  02/18/2022   Cerebrovascular accident (CVA) due to occlusion of left middle cerebral artery 02/11/2022   SPEECH THERAPY DISCHARGE SUMMARY  Visits from Start of Care: 23  Current functional level related to goals / functional outcomes: See below for goal update. Pt's performance improves significantly when context is known.  Pt's verbal expression is augmented by/primarily completed with a speech generating device.   Remaining deficits: Pt cont to exhibit severe/profound expressive communication deficits, with mild-mod receptive language deficits.    Education / Equipment: See therapy notes and "pt education"   Patient agrees to discharge. Patient goals were partially met. Patient is being discharged due to being pleased with the current functional level..      ONSET DATE: 02-11-22   REFERRING DIAG: I63.9 (ICD-10-CM) - Cerebrovascular accident (CVA), unspecified mechanism (HCC) R13.10 (ICD-10-CM) - Dysphagia, unspecified type   THERAPY DIAG:  Verbal apraxia  Aphasia  Rationale for Evaluation and Treatment Rehabilitation  SUBJECTIVE:   SUBJECTIVE STATEMENT: "I really like it." Gordon Fisher)    Gordon Fisher indicated he was comfortable with d/c today.  Pt accompanied by: significant other  PERTINENT HISTORY: Gordon Fisher is a 62 year old male in relatively good health who was admitted via [facility] on 02/11/22 with right sided weakness, right facial droop and slurred speech. CTH showed L-MCA hyperdense sign and he received TNK prior to transfer. CTA showed L-MCA MI occlusion and he underwent  cerebral angio with thrombectomy and TICI 3 flow by Gordon Fisher.  Follow up MRI brain done revealing small areas of acute infarct infarct in L-MCA territory with mild hemorrhagic transformation in left parietal lobe and L-MCA bifurcation residual vascular thrombus. He was taken back for thrombectomy with stent for dissection but had intra-stent occlusion which was unable to be recanalized. Post  op CT showed small SAH which was stable on follow up. He was started on ASA/Brillinta. MBS 04/18 showed oral dysphagia with oral delay and no penetration or aspiration. He was started on D3, thins with supervision. Repeat MRI brain on 04/16 showed extensive evolving acute ischemic L-MCA occlusion increased in size with associated edema, few additional small acute cortical subcortical infarcts involving contralateral right frontal lobe and left ACA distribution. Stroke felt to be due to unknown etiology and wife elected on Zio patch on discharge to rule out A fib. 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 Gordon Fisher was d/c'd from Gordon Fisher on 03-19-22, and has had HHST since that time until last week.   PAIN:  Are you having pain? No  PATIENT GOALS Pt indicated he would like talking to improve  OBJECTIVE:  DIAGNOSTIC FINDINGS: MRI without contrast 02/13/22 IMPRESSION: 1. Extensive evolving acute ischemic left MCA distribution infarct, increased in size as compared to previous MRI from 02/11/2022. Associated edema without significant midline shift. 2. Small volume acute subarachnoid hemorrhage at the posterior aspect of the left Sylvian fissure, stable. 3. Few additional small volume acute ischemic cortical and subcortical infarcts involving the contralateral right frontal lobe and left ACA distribution as above. 4. Underlying chronic microvascular ischemic disease.    PATIENT REPORTED OUTCOME MEASURES (PROM): Communication Effectiveness Survey: Pt scored 8/32 - higher scores indicate more effective communication/QOL.   TODAY'S TREATMENT:  02/16/23: SLP asked Gordon Fisher to show SLP new icons on Lingraphica and he did so 5/6 success. His verbal expression of indicated items improved with use of Lingraphica due to ability to read responses. Pt nor wife had further questions about Lingraphica or it's use.   Gordon Fisher and pt agree that his communication is easier now using speech and the Lingraphica, than initial 2-3 sessions. Told pt and wife that if pt desires another course of ST to "refresh" for 4-6 weeks he could request referral from PCP or from referring Fisher.   02/02/23: Pt requires less cueing today with creating and editing icons - rare min verbal cues. Mod-max A still needed for spelling.  He told SLP and Gordon Fisher two vegetables he wanted to add to "garden" page. With 5/5 questions today pt drilled down to correct page without cues. Wife once had to cue pt to use device after multiple attempts saying son-in-law's name. SLP strongly urged pt/wife to use device daily as much as possible - keep device open and available all day.  3/18/24Cipriano Fisher added some diagnoses to "health". SLP asked pt which diagnoses were pertinent for his speech difficulty and he req'd max A. Later in session he req'd extra time for success. Using Lingraphica, pt answered questions with 90% success and min A rarely to navigate to correct page. He req'd min A occasionally for editing an icon, and min A rarely for adding icon to a page. Pt demonstrated understanding of SLP simple questions with rare minA. Pt to cont at once/two weeks x2-3 more visits.   01/11/23: Pt did not schedule more visits so he will not decr  to once every other week at this time. Likely to occur after next session on 01-17-23. SLP provided pt with rare min-mod A for editing icon procedure, and procedure for adding icon, and max A for typing icon name. Wife demonstrated modified independence (extra time) for these procedures. Pt answered simple questions x6 with Lingraphica with correct drill down technique 6/8 and improved to 8/8 with min A from SLP (written cue) for comprehension. SLP needed to cue Gordon Fisher today to slow her rate of speech and simplify language when talking to pt.  01/04/23: Today SLP provided support for pt and Gordon Fisher using Lingur[employer]ica. SLP req'd to  provide pt with mod cues for programming simple tasks (sound and text) and how to navigate to edit page. SLP questions the extent pt is using the device at home. SLP told wife that pt should use the device at LEAST 1-2 times daily and to have Lingraphica out and open so pt is encouraged to use it more. Pt to decr to once every other week next session.   12/28/22: Pt with new device. Gordon Fisher now backing up regularly, after new icons/voices created. Today pt used Lingraphica independently for questions re: family and with occasional max A primarily for language comprehension for SLP question stimuli and then rare min A for pt's navigating to correct response. Once on the page pt activated correct icon 90%. Homework to have Gordon Fisher assist pt in generating 70% of new icons, she will do 30% herself for saving time. SLP reiterated eventually we want pt to program himslef but he needs practice to do that.   12/13/22: Pt with new device and loaner device present today. Plan was for SLP to take loaner and return to [place] however Gordon Fisher did not back up the loaner with all the farm icons and information she did since last session. SLP attempted this process which took 5 minutes but was unsuccessful. Pt req'd mod A-max A consistently with two-word icon names as they were recorded so SLP trained/re-educated pt on how to make changes to timing of icon names by putting period and a space between the two words. Fading cues necessary ultimately to min A occasionally. SLP strongly stressed to pt and wife that pt needs to USE the device at home and provided possible opportunities for him to do so.  Gordon Fisher to contact [employer] and see how to get loaner backup onto pt's device , by next session. Three more sessions after today appear sufficient - pt/wife agree.  12/02/22: Pt entered room with device and indicated correctly the people who were at his house yesterday. He was independent with accessing vegetables he will grown in  the spring. SLP assisted (mod A occasionally) pt in programming icon for "seed" and "leaf lettuce". Pt req'd mod -max cues for adding icons. Max/total A for spelling icon names.  11/23/22: Pt has not been seen in over one month - verbal and receptive language skills have minimally improved. Pt has not spontaneously used device at home - however has used device when (rarely) prompted by wife. Gordon Fisher has programmed device a little with farm items and garden items. SLP assisted pt and wife to relocate icons to more proper location/s. Pt req'd initial demo cues faded to occasional mod cues for editing icons' pictures and sound. With text, pt req'd faded cues to occasional min cues.  Continue all goals not met. Pt verbal expression improved dramatically when looking at picture and seeing icon name.   12/18/23Cipriano Fisher made some icons last  week. Realized she had duplicated them when she saw them on another page and realized she had put some icons on the wrong page. Gordon Fisher is becoming more independent with deleting icons and is independent with moving icons up and down the page. He still requires occasional min A with adding and modifying icons but is improving with this as his exposure with the process continues. Pt currently without internet so he cannot search for icons or back up device at home.   10/11/22: SLP began educating pt/wife how to create icons using camera, altered pronunciation text field for "Gordon Fisher", and searching online for photo. By session end pt was accessing orange three-bar icon in upper right hand corner independently when cued "what do you press to modify or add icons?", and moving icons independently. Pt and wife to make icons for family, and use for communicating food choices.  10-08-22: Lingraphica start trial date today. SLP introduced device to pt/wife. Pt with functional verbalization within 3 seconds of initiation for "ham", "tomato", "cheese", "Gordon Fisher", and "sandwich". Pt produced  "sandwich" spontaneously after 8 seconds and mutliple attempts, without label. SLP introduced how to move, add, and delete icons. Pt communicated his sandwich choice ("sandwich meat" originally, and then "ham" after it was added to page), and toppings choices (tomato, cheese) with consistent min-mod cues, faded to occasional min-mod cues. Pt and wife to use device over the weekend for food - - SLP to assist/teach pt and Gordon Fisher how to add pictures to icons for family members next session.  09/27/22: Lingraphica not yet sent from [facility]. Should be here by pt's next appointment.  Gordon Fisher told SLP names of people at their Thanksgiving celebration, with usual mod to max assist. Visual cues were most effective on pt's most difficult words. SLP assessed pt's reading comprehension - 80% with 4-5 words.   11/8/23Cipriano Fisher stated they had been busy this week with appointments but still work at practicing some. SLP initiated a 20-minute discussion today with Barrett Shell, and SLP explaining speech generating device in depth, showing Gordon Fisher and Gordon Fisher options for trial device and deciding on the Lingraphica Touch Talk. SLP to make referral for this in the next few days.  Family names were written with mod-max A usually - pt got first 3 letters correct with childrens and grandchildrens' names. SLP showed Gordon Fisher how to break up daughter's name to [id number]-[id number] to encourage brain's processing in a different way in order to foster new neural pathways to say the name.  09/06/22: SLP targeted verbal expression with picture naming and divergent naming. Pt notably demonstrates he knows the word/s he wants to say but is finding it difficult to pronounce them (verbal apraxia). Pt's expression improves with visual cues (oral cues from SLP for initial phoneme/sound) - 75% successful even without sound necessary - just visual cue for articulator placement.   09/01/22: SLP worked with pt's verbal expression and auditory comprehension today. Simple  conversation/sentences were understood 90% of the time, 100% with one repeat by speaker. With opposites/paired words, pt had 70% success, improved to 90% with min A. With cloze phrases/sentences pt's response accuracy improved with 1 syllable words, but overall success was 17% with min A, improved to 50% with mod-max A. Often, pt knew the response but knew he was going to misarticulate the response. Written cues were helpful for pt here. SLP encouraged wife to cont with cloze phrases and paired words at home.  10/25/23Cipriano Fisher brought pt's cards today and SLP reviewed them with pt, modleing  to wife cueing methods and hierarchy.Pt functionally said 6/7 salient objects (without written words) with mod-max cues, and 1/7 spontaneously. He said family names with written cues 6/7 (functionally), and 1/7 wth max cues Shanda Bumps"). Pt demo'd understnading of directions in context 75% today. SLP introduce possibility of Lingraphica to pt and wife and educated about studies proving that AAC assists verbal communication and that pt may not have to have assistance with a AAC device but we will know more as time progresses.     PATIENT EDUCATION: Education details: see "Today's therapy" Person educated: Patient and Spouse Education method: Explanation Education comprehension: verbalized understanding and needs further education   GOALS: Goals reviewed with patient? Yes, in general  SHORT TERM GOALS: Target date: 08/31/2022 extended one week due to visits  Pt will name 6 salient family names when given picture and written name with consistent mod-max cues in 3 sessions (precursor for speech generating device) Baseline:08-25-22. 09-01-22 Goal status: partially met  2.  Pt will produce simple phoneme/ bilabial+/a/  /a/, /ba/, /ma/, and/or /pa/ simultaneously with SLP 65% success (equal # reps/stimulus) in 2 sessions Baseline:  Goal status: deferred - pt to focus on salient items/terms  3.  Pt will demo  understanding of simple commands in context 80% over three sessions Baseline: 08-25-22, 09-01-22 Goal status: Partially met  4.  Pt will indicate understanding of a complex sentence 85% of the time in 3 sessions (precursor for speech generating device) Baseline:  Goal status: not met  5.  Pt will copy written family names with 50% success in two sessions  Baseline:  Goal Status: met   LONG TERM GOALS: Target date: 10/24/2022 , 02/01/23  Pt will name 6 salient family names when given picture and written name with consistent mod cues in 3 sessions (precursor for speech generating device) Baseline: 12/28/22, 01-04-23 Goal status: met  2.  Pt will IMITATE simple phoneme/bilabial+/a/  /a/, /ba/, /ma/, and/or /pa/  65% success (equal # reps/stimulus) in 2 sessions Baseline:  Goal status: deferred - working with pt on more salient words  3.  Pt will generate meaningful message understood by [name] using speech generating device 60% of the time in 3 sessions Baseline: 01/11/23, 01/17/23 Goal status: met  4.  [name] will indicate that spontaneous communication is easier than during week of evaluation Baseline:  Goal status: Met   ASSESSMENT:  CLINICAL IMPRESSION: Gordon Fisher is a 62 y.o. male who cont to be seen today for treatment of verbal apraxia (as pt often indicates he knows the response but knows it will not be articulated correctly) and aphasia. Treatment involves use of pt's Lingraphica speech generating device (SGD). SEE TX NOTE from today for more details. Gordon Fisher used device independently today in different areas/pages than previous session, so SLP is assured he has been practicing with the device at home since previous session.   OBJECTIVE IMPAIRMENTS include expressive language, receptive language, and aphasia. These impairments are limiting patient from return to work, managing medications, managing appointments, household responsibilities, ADLs/IADLs, and effectively communicating at home and  in community. Factors affecting potential to achieve goals and functional outcome are ability to learn/carryover information and severity of impairments. Patient will benefit from skilled SLP services to address above impairments and improve overall function.  REHAB POTENTIAL: Good  PLAN: Discharge today.  PLANNED INTERVENTIONS: Language facilitation, Environmental controls, Cueing hierachy, Internal/external aids, Functional tasks, and Multimodal communication approach    Elektra Wartman, CCC-SLP 02/16/2023, 12:41 PM

## 2023-02-16 NOTE — Therapy (Signed)
OUTPATIENT OCCUPATIONAL THERAPY NEURO TREATMENT NOTE  Patient Name: Gordon Fisher MRN: [id number] DOB:1961-02-26, 62 y.o., male Today's Date: 02/17/2023  PCP: none on file REFERRING PROVIDER: Fanny Dance, MD    OT End of Session - 02/16/23 0743     Visit Number 26    Number of Visits 30    Date for OT Re-Evaluation 02/25/23    Authorization Type [employer]    Authorization Time Period VL: 60 combined (OT/PT/ST)    Authorization - Number of Visits 20    OT Start Time 1317    OT Stop Time 1400    OT Time Calculation (min) 43 min    Activity Tolerance Patient tolerated treatment well    Behavior During Therapy WFL for tasks assessed/performed                             Past Medical History:  Diagnosis Date   Stroke    Past Surgical History:  Procedure Laterality Date   BUBBLE STUDY  02/15/2022   Procedure: BUBBLE STUDY;  Surgeon: Christell Constant, MD;  Location: [facility];  Service: Cardiovascular;;   IR CT HEAD LTD  02/11/2022   IR CT HEAD LTD  02/11/2022   IR CT HEAD LTD  02/11/2022   IR PERCUTANEOUS ART THROMBECTOMY/INFUSION INTRACRANIAL INC DIAG ANGIO  02/11/2022   IR PERCUTANEOUS ART THROMBECTOMY/INFUSION INTRACRANIAL INC DIAG ANGIO  02/11/2022   IR US GUIDE VASC ACCESS RIGHT  02/11/2022   IR US GUIDE VASC ACCESS RIGHT  02/11/2022   RADIOLOGY WITH ANESTHESIA N/A 02/11/2022   Procedure: IR WITH ANESTHESIA;  Surgeon: Radiologist, Medication, MD;  Location: [facility] OR;  Service: Radiology;  Laterality: N/A;   RADIOLOGY WITH ANESTHESIA N/A 02/11/2022   Procedure: RADIOLOGY WITH ANESTHESIA;  Surgeon: Radiologist, Medication, MD;  Location: [facility] OR;  Service: Radiology;  Laterality: N/A;   TEE WITHOUT CARDIOVERSION N/A 02/15/2022   Procedure: TRANSESOPHAGEAL ECHOCARDIOGRAM (TEE);  Surgeon: Christell Constant, MD;  Location: [facility] ENDOSCOPY;  Service: Cardiovascular;  Laterality: N/A;   Patient Active Problem List   Diagnosis Date Noted    Hypocalcemia 03/22/2022   DVT, lower extremity, distal 03/22/2022   Obesity 03/22/2022   Elevated LDL cholesterol level 03/22/2022   Acute ischemic left middle cerebral artery (MCA) stroke 02/18/2022   Cerebrovascular accident (CVA) due to occlusion of left middle cerebral artery 02/11/2022    ONSET DATE: referral 07/15/22 (CVA 02/11/22)  REFERRING DIAG: I63.9 (ICD-10-CM) - Cerebrovascular accident (CVA), unspecified mechanism (HCC) R13.10 (ICD-10-CM) - Dysphagia, unspecified type   THERAPY DIAG:  Hemiplegia and hemiparesis following cerebral infarction affecting right dominant side  Muscle weakness (generalized)  Unsteadiness on feet  Other lack of coordination  Other disturbances of skin sensation  Rationale for Evaluation and Treatment Rehabilitation  SUBJECTIVE:   SUBJECTIVE STATEMENT: Pt's spouse reports her sister continues to work with pt and pt's arm. 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 No  FALLS: Has patient fallen in last 6 months? Yes. Number of falls 1  PLOF: Independent, Independent with basic ADLs, and Independent with gait  PATIENT GOALS: continue to make gains, independence with getting dressed   OBJECTIVE:   HAND DOMINANCE: Right  FUNCTIONAL OUTCOME MEASURES: FOTO: 11  UPPER EXTREMITY ROM    Active ROM: Pt with no AROM during  evaluation.  Pt was able to elicit min shoulder elevation and slight gross grasp when cued and provided increased time to initiate movement.   09/22/22: Pt still with no AROM in upright position, is able to elicit slight internal rotation and mod elbow flexion/extension in gravity assisted position. 11/23/22: Pt eliciting trace shoulder flexion, 44* elbow flexion, -16* elbow extension, and able to elicit thumb extension with shoulder internal rotation.  Pt  demonstrating ~30% of scapular retraction and elevation with intermitent tactile cues.     Passive ROM Right Eval - 9/25 Right 11/23/22  Shoulder flexion 100 120  Shoulder abduction  95  Shoulder adduction    Shoulder extension    Shoulder internal rotation WNL Unable to tolerate reaching past torso towards back pocket  Shoulder external rotation onset of pain >5-10* from neutral Onset of pain > 30*  Middle trapezius    Lower trapezius    Elbow flexion WNL WNL  Elbow extension WNL WNL  Wrist flexion WNL WNL  Wrist extension WNL WNL  Wrist ulnar deviation    Wrist radial deviation    Wrist pronation    Wrist supination    (Blank rows = not tested)  HAND FUNCTION: 11/23/22: loose gross grasp with increased time for initiation, unable to release grasp  SENSATION: Difficult to assess due to expressive aphasia  MUSCLE TONE: RUE: Mild and Hypertonic  ------------------------------------------------------------------------------------------------------------------------------------------------------------ (objective measures above completed at initial evaluation unless otherwise dated)  TODAY'S TREATMENT: 02/16/23 Tricep activation in sitting with NMES.   Negative electrode placed in the middle of the triceps; the positive electrode is more distal towards the elbow joint itself.  Utilized Ship broker for visual feedback and cues to further tricep activation in conjunction with e-stim. Pt utilizing chest and shoulder movement as compensatory strategy to increase tricep activation/appearance of activation.  Pt with no c/o pain at beginning or end of session.  No adverse reactions after treatment and skin intact.   Ratio 1:2 Rate 35 pps Waveform- Asymmetric Ramp 1.0 Pulse 250 Intensity- 23 Duration -   8 min at tricep Table slides in sidelying: with focus on shoulder flexion/extension and tricep activation.  OT facilitating ROM then fading to intermittent tactile cues and facilitation for  end range stretch.  OT providing target for increased ROM and cues to visually attend to UE.  Pt demonstrating improved tricep activation to pull arm back and then combination with shoulder extension to slide arm on table top.  Provided with handout and cues for ways to aid in movement through full ROM.    02/02/23 NMES applied to supraspinatus and middle deltoid to help approximate shoulder joint to reduce sublux and reduce pain. Pt demonstrating slight activation at deltoid with NMES facilitating muscle quiver and minimal elevation. Attempted to elicit finger extension with electrodes placed on forearm extensors.  Pt demonstrating extension in thumb, index, and long finger this session.  Pt pleased to see and feel movement.  OT then facilitated increased challenge with encouraging pt to utilize stimulation and attempt to activate further extension, trace additional movement felt in thumb.  Pt with no c/o pain at beginning or end of session.  No adverse reactions after treatment and skin intact.   Ratio 1:2 Rate 35 pps Waveform- Asymmetric Ramp 1.0 Pulse 250 Intensity- 18-23 Duration -   8 min at deltoid/supraspinatus and 8 mins at forearm extensors NMR: engaged in bicep activation in sitting with pt able to bend elbow to move block towards self but with difficulty moving arm away  from body.  OT facilitating movement by providing tapping to triceps, however still unable to activate any movement in current position.  OT reiterated to pt and spouse to focus on triceps in supine and sidelying with elbow extension in supine, OT providing demonstration against gravity and with gravity minimized.    01/17/23 NMES: NMES applied to supraspinatus and middle deltoid to help approximate shoulder joint to reduce sublux and reduce pain. Pt demonstrating slight activation at deltoid with NMES however not enough to elicit movement.  Pt with no c/o pain at beginning or end of session.  No adverse reactions after  treatment and skin intact.  Attempted to elicit finger extension with electrodes placed on forearm extensors.  Pt with no sensation to stimulation and no activation, therefore terminated task. Ratio 1:3 Rate 35 pps Waveform- Asymmetric Ramp 1.0 Pulse 300 Intensity- 180 volts Duration -   8 min at deltoid and 3 forearm extensors NMR: engaged in shoulder elevation and finger flexion with OT providing tactile cues and support.  Pt able to elicit finger flexion this session with increased time and effort, however unable to sustain grasp on items and with inability to release grasp.      PATIENT EDUCATION: Ongoing condition-specific education related to therapeutic interventions completed this session Person educated: Patient and Spouse Education method: Explanation, Demonstration, Tactile cues, Verbal cues, and Handouts Education comprehension: verbalized understanding and needs further education   HOME EXERCISE PROGRAM: Written exercises of shoulder and elbow in supine with cues for hand placement and facilitation.   Self-ROM handout from [facility]  Access Code: [email or web address] URL: [email or web address] Date: 02/16/2023 Prepared by: [facility] - Outpatient  Rehab - [facility]  Exercises - Supine Shoulder Flexion AAROM with Hands Clasped  - 1 x daily - 1 sets - 10 reps - Supine Shoulder Abduction PROM with Caregiver  - 1 x daily - 10 reps - Supine Shoulder Abduction AAROM with Dowel  - 1 x daily - 3 x weekly - 10 reps - Seated Elbow Extension and Shoulder External Rotation AAROM at Table with Towel  - 1 x daily - 1 sets - 10 reps - Finger Extension with Wrist Extension Caregiver PROM  - 1 x daily - 1 sets - 10 reps - Seated Dowel Chest Press  - 3 x weekly - 1-2 sets - 10 reps - Seated Shoulder Flexion with Dowel to 90  - 3 x weekly - 1-2 sets - 10 reps - Sidelying Shoulder Flexion  - 3 x weekly - 2 sets - 10 reps  GOALS: Goals reviewed with patient? Yes  SHORT TERM  GOALS:   12/24/22  Pt and spouse will be independent with advanced PROM and AAROM HEP for shoulder stability and shoulder and elbow ROM. Baseline: Goal status: MET - 12/28/22  2.  Pt and spouse will verbalize understanding of AE and/or task modifications as needed for fastening shoes.  Baseline:  Goal status: IN PROGRESS   LONG TERM GOALS: Target date 02/25/23  1.  Pt will utilize RUE as gross assist during grooming tasks (such as washing hands and face). Baseline:  Goal status: IN PROGRESS  2.   Pt and spouse will verbalize and/or  demonstrate understanding of advanced treatment strategies and implementation at home (NMES) to facilitate increased motor recovery.  Baseline:  Goal status: IN PROGRESS  3.   Pt will demonstrate ability to utilize BUE during mobility tasks to pushup from chair with BUE on arm rests.  Baseline:  Goal status: IN  PROGRESS   ASSESSMENT:  CLINICAL IMPRESSION: Treatment session with focus on NMR of RUE with and without use of handheld NMES.  Pt with increased results with use of hand held [place] NMES with tricep activation this session.  OT utilized Ship broker for increased visual feedback.  Engaged in sidelying ROM with use of bedside table to decrease effects of gravity on movement.  Pt demonstrating increased shoulder flexion/extension and inconsistent bicep/tricep activation requiring tapping for further facilitation.   PERFORMANCE DEFICITS in functional skills including ADLs, IADLs, coordination, sensation, tone, ROM, strength, pain, flexibility, FMC, GMC, mobility, balance, body mechanics, vision, and UE functional use and psychosocial skills including environmental adaptation and routines and behaviors.   IMPAIRMENTS are limiting patient from ADLs and IADLs.   COMORBIDITIES may have co-morbidities  that affects occupational performance. Patient will benefit from skilled OT to address above impairments and improve overall function.   PLAN: OT  FREQUENCY: 1-2x/week  OT DURATION: 4 weeks  PLANNED INTERVENTIONS: self care/ADL training, therapeutic exercise, therapeutic activity, neuromuscular re-education, manual therapy, passive range of motion, balance training, functional mobility training, splinting, electrical stimulation, compression bandaging, moist heat, cryotherapy, patient/family education, visual/perceptual remediation/compensation, psychosocial skills training, energy conservation, coping strategies training, and DME and/or AE instructions  RECOMMENDED OTHER SERVICES: N/A  CONSULTED AND AGREED WITH PLAN OF CARE: Patient and family member/caregiver  PLAN FOR NEXT SESSION:  Review full HEP to consolidate and prepare for d/c.  Increased focus on incorporation of RUE into functional tasks.  E-stim as cleared by MD.  [place] Nodal activation in supine and sidelying with or without NMES.   Rosalio Loud, OTR/L 02/17/2023, 7:44 AM

## 2023-02-23 ENCOUNTER — Ambulatory Visit: Payer: 59

## 2023-02-23 ENCOUNTER — Ambulatory Visit: Payer: 59 | Admitting: Occupational Therapy

## 2023-02-23 DIAGNOSIS — R208 Other disturbances of skin sensation: Secondary | ICD-10-CM

## 2023-02-23 DIAGNOSIS — M6281 Muscle weakness (generalized): Secondary | ICD-10-CM

## 2023-02-23 DIAGNOSIS — R278 Other lack of coordination: Secondary | ICD-10-CM

## 2023-02-23 DIAGNOSIS — R262 Difficulty in walking, not elsewhere classified: Secondary | ICD-10-CM

## 2023-02-23 DIAGNOSIS — R2681 Unsteadiness on feet: Secondary | ICD-10-CM

## 2023-02-23 DIAGNOSIS — I69351 Hemiplegia and hemiparesis following cerebral infarction affecting right dominant side: Secondary | ICD-10-CM

## 2023-02-23 DIAGNOSIS — R2689 Other abnormalities of gait and mobility: Secondary | ICD-10-CM

## 2023-02-23 NOTE — Therapy (Signed)
OUTPATIENT OCCUPATIONAL THERAPY NEURO TREATMENT NOTE & Discharge Note  Patient Name: Gordon Fisher MRN: [id number] DOB:05-05-1961, 62 y.o., male Today's Date: 02/23/2023  PCP: none on file REFERRING PROVIDER: Fanny Dance, MD    OCCUPATIONAL THERAPY DISCHARGE SUMMARY  Visits from Start of Care: 27  Current functional level related to goals / functional outcomes: Pt has made some progress with motor recovery of dominant RUE.  Pt has demonstrated ability to initiate shoulder and bicep/tricep movement and is demonstrating grasp, however these movements are still not truly functional.  Pt has utilized NMES during treatment to facilitate shoulder and tricep activation with good results with and without stimulation.  Pt benefits from multiple positions to facilitate ROM and use of mirror for visual feedback.  Pt has demonstrated improvements in independence with self-care tasks, however still not incorporating RUE beyond stabilizer.   Remaining deficits: RUE shoulder subluxation and weakness   Education / Equipment: ROM HEP, educated on positioning and use of RUE as stabilizer to gross assist, NMES for further facilitation of NMR.   Patient agrees to discharge. Patient goals were partially met. Patient is being discharged due to being pleased with the current functional level. And plan to hold some visits for remainder of year due to strict visit limit.      OT End of Session - 02/23/23 1246     Visit Number 27    Number of Visits 30    Date for OT Re-Evaluation 02/25/23    Authorization Type [employer]    Authorization Time Period VL: 60 combined (OT/PT/ST)    Authorization - Number of Visits 20    OT Start Time 1236    OT Stop Time 1315    OT Time Calculation (min) 39 min    Activity Tolerance Patient tolerated treatment well    Behavior During Therapy WFL for tasks assessed/performed                              Past Medical History:   Diagnosis Date   Stroke    Past Surgical History:  Procedure Laterality Date   BUBBLE STUDY  02/15/2022   Procedure: BUBBLE STUDY;  Surgeon: Christell Constant, MD;  Location: [facility];  Service: Cardiovascular;;   IR CT HEAD LTD  02/11/2022   IR CT HEAD LTD  02/11/2022   IR CT HEAD LTD  02/11/2022   IR PERCUTANEOUS ART THROMBECTOMY/INFUSION INTRACRANIAL INC DIAG ANGIO  02/11/2022   IR PERCUTANEOUS ART THROMBECTOMY/INFUSION INTRACRANIAL INC DIAG ANGIO  02/11/2022   IR US GUIDE VASC ACCESS RIGHT  02/11/2022   IR US GUIDE VASC ACCESS RIGHT  02/11/2022   RADIOLOGY WITH ANESTHESIA N/A 02/11/2022   Procedure: IR WITH ANESTHESIA;  Surgeon: Radiologist, Medication, MD;  Location: [facility] OR;  Service: Radiology;  Laterality: N/A;   RADIOLOGY WITH ANESTHESIA N/A 02/11/2022   Procedure: RADIOLOGY WITH ANESTHESIA;  Surgeon: Radiologist, Medication, MD;  Location: [facility] OR;  Service: Radiology;  Laterality: N/A;   TEE WITHOUT CARDIOVERSION N/A 02/15/2022   Procedure: TRANSESOPHAGEAL ECHOCARDIOGRAM (TEE);  Surgeon: Christell Constant, MD;  Location: [facility] ENDOSCOPY;  Service: Cardiovascular;  Laterality: N/A;   Patient Active Problem List   Diagnosis Date Noted   Hypocalcemia 03/22/2022   DVT, lower extremity, distal 03/22/2022   Obesity 03/22/2022   Elevated LDL cholesterol level 03/22/2022   Acute ischemic left middle cerebral artery (MCA) stroke 02/18/2022   Cerebrovascular accident (CVA) due to occlusion of left  middle cerebral artery 02/11/2022    ONSET DATE: referral 07/15/22 (CVA 02/11/22)  REFERRING DIAG: I63.9 (ICD-10-CM) - Cerebrovascular accident (CVA), unspecified mechanism (HCC) R13.10 (ICD-10-CM) - Dysphagia, unspecified type   THERAPY DIAG:  Muscle weakness (generalized)  Hemiplegia and hemiparesis following cerebral infarction affecting right dominant side  Unsteadiness on feet  Other lack of coordination  Other disturbances of skin sensation  Rationale for Evaluation and  Treatment Rehabilitation  SUBJECTIVE:   SUBJECTIVE STATEMENT: Pt and spouse in agreement with plan to d/c and continue with HEP at home to allow for visits remaining as needed this calendar year. 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 No  FALLS: Has patient fallen in last 6 months? Yes. Number of falls 1  PLOF: Independent, Independent with basic ADLs, and Independent with gait  PATIENT GOALS: continue to make gains, independence with getting dressed   OBJECTIVE:   HAND DOMINANCE: Right  FUNCTIONAL OUTCOME MEASURES: FOTO: 11 02/23/23: 42.9 FOTO  UPPER EXTREMITY ROM    Active ROM: Pt with no AROM during evaluation.  Pt was able to elicit min shoulder elevation and slight gross grasp when cued and provided increased time to initiate movement.   09/22/22: Pt still with no AROM in upright position, is able to elicit slight internal rotation and mod elbow flexion/extension in gravity assisted position. 11/23/22: Pt eliciting trace shoulder flexion, 44* elbow flexion, -16* elbow extension, and able to elicit thumb extension with shoulder internal rotation.  Pt demonstrating ~30% of scapular retraction and elevation with intermitent tactile cues.     Passive ROM Right Eval - 9/25 Right 11/23/22 Right 02/23/23  Shoulder flexion 100 120 120  Shoulder abduction  95 95  Shoulder adduction     Shoulder extension     Shoulder internal rotation WNL Unable to tolerate reaching past torso towards back pocket Unable to tolerate reaching past torso towards back pocket  Shoulder external rotation onset of pain >5-10* from neutral Onset of pain > 30* Onset of pain > 30*  Middle trapezius     Lower trapezius     Elbow flexion WNL WNL   Elbow extension WNL WNL   Wrist flexion WNL WNL   Wrist extension WNL WNL   Wrist ulnar  deviation     Wrist radial deviation     Wrist pronation     Wrist supination     (Blank rows = not tested)  HAND FUNCTION: 11/23/22: loose gross grasp with increased time for initiation, unable to release grasp  SENSATION: Difficult to assess due to expressive aphasia  MUSCLE TONE: RUE: Mild and Hypertonic  ------------------------------------------------------------------------------------------------------------------------------------------------------------ (objective measures above completed at initial evaluation unless otherwise dated)  TODAY'S TREATMENT: 02/23/23 Tricep activation in sitting with NMES.   Negative electrode placed in the middle of the triceps; the positive electrode is more distal towards the elbow joint itself.  Utilized UE Ranger with NMES with focus on activating triceps during "push and pull".  OT providing support at elbow for shoulder subluxation and to facilitate movement in conjunction with stimulation. Pt utilizing chest and shoulder movement as compensatory strategy to increase tricep activation/appearance of activation, able to decrease with cues to draw attention to movement.  Pt with no c/o pain at beginning or end of session.  No adverse reactions after treatment and skin intact.   Ratio 1:2 Rate 35  pps Waveform- Asymmetric Ramp 1.0 Pulse 250 Intensity- 23 Duration -   8 min at tricep Tricep activation: After removal of NMES pt continues to demonstrate good carryover of bicep and tricep activation with UE Ranger.  OT then educating on use of cane or walking stick for movement at home.  Pt able to complete with use of cane with setup to obtain grasp on cane and tactile cues at elbow.     02/16/23 Tricep activation in sitting with NMES.   Negative electrode placed in the middle of the triceps; the positive electrode is more distal towards the elbow joint itself.  Utilized Ship broker for visual feedback and cues to further tricep activation in conjunction  with e-stim. Pt utilizing chest and shoulder movement as compensatory strategy to increase tricep activation/appearance of activation.  Pt with no c/o pain at beginning or end of session.  No adverse reactions after treatment and skin intact.   Ratio 1:2 Rate 35 pps Waveform- Asymmetric Ramp 1.0 Pulse 250 Intensity- 23 Duration -   8 min at tricep Table slides in sidelying: with focus on shoulder flexion/extension and tricep activation.  OT facilitating ROM then fading to intermittent tactile cues and facilitation for end range stretch.  OT providing target for increased ROM and cues to visually attend to UE.  Pt demonstrating improved tricep activation to pull arm back and then combination with shoulder extension to slide arm on table top.  Provided with handout and cues for ways to aid in movement through full ROM.    02/02/23 NMES applied to supraspinatus and middle deltoid to help approximate shoulder joint to reduce sublux and reduce pain. Pt demonstrating slight activation at deltoid with NMES facilitating muscle quiver and minimal elevation. Attempted to elicit finger extension with electrodes placed on forearm extensors.  Pt demonstrating extension in thumb, index, and long finger this session.  Pt pleased to see and feel movement.  OT then facilitated increased challenge with encouraging pt to utilize stimulation and attempt to activate further extension, trace additional movement felt in thumb.  Pt with no c/o pain at beginning or end of session.  No adverse reactions after treatment and skin intact.   Ratio 1:2 Rate 35 pps Waveform- Asymmetric Ramp 1.0 Pulse 250 Intensity- 18-23 Duration -   8 min at deltoid/supraspinatus and 8 mins at forearm extensors NMR: engaged in bicep activation in sitting with pt able to bend elbow to move block towards self but with difficulty moving arm away from body.  OT facilitating movement by providing tapping to triceps, however still unable to activate  any movement in current position.  OT reiterated to pt and spouse to focus on triceps in supine and sidelying with elbow extension in supine, OT providing demonstration against gravity and with gravity minimized.     PATIENT EDUCATION: Ongoing condition-specific education related to therapeutic interventions completed this session Person educated: Patient and Spouse Education method: Explanation, Demonstration, Tactile cues, Verbal cues, and Handouts Education comprehension: verbalized understanding and needs further education   HOME EXERCISE PROGRAM: Written exercises of shoulder and elbow in supine with cues for hand placement and facilitation.   Self-ROM handout from [facility] lab  Access Code: [email or web address] URL: [email or web address] Date: 02/16/2023 Prepared by: [facility] - [facility]  Exercises - Supine Shoulder Flexion AAROM with Hands Clasped  - 1 x daily - 1 sets - 10 reps - Supine Shoulder Abduction PROM with Caregiver  - 1 x daily - 10 reps - Supine  Shoulder Abduction AAROM with Dowel  - 1 x daily - 3 x weekly - 10 reps - Seated Elbow Extension and Shoulder External Rotation AAROM at Table with Towel  - 1 x daily - 1 sets - 10 reps - Finger Extension with Wrist Extension Caregiver PROM  - 1 x daily - 1 sets - 10 reps - Seated Dowel Chest Press  - 3 x weekly - 1-2 sets - 10 reps - Seated Shoulder Flexion with Dowel to 90  - 3 x weekly - 1-2 sets - 10 reps - Sidelying Shoulder Flexion  - 3 x weekly - 2 sets - 10 reps  GOALS: Goals reviewed with patient? Yes  SHORT TERM GOALS:   12/24/22  Pt and spouse will be independent with advanced PROM and AAROM HEP for shoulder stability and shoulder and elbow ROM. Baseline: Goal status: MET - 12/28/22  2.  Pt and spouse will verbalize understanding of AE and/or task modifications as needed for fastening shoes.  Baseline:  Goal status: IN PROGRESS   LONG TERM GOALS: Target date 02/25/23  1.  Pt  will utilize RUE as gross assist during grooming tasks (such as washing hands and face). Baseline:  Goal status: Not met   2.   Pt and spouse will verbalize and/or  demonstrate understanding of advanced treatment strategies and implementation at home (NMES) to facilitate increased motor recovery.  Baseline:  Goal status: Met - 02/23/23  3.   Pt will demonstrate ability to utilize BUE during mobility tasks to pushup from chair with BUE on arm rests.  Baseline:  Goal status: Partially met - 02/23/23   ASSESSMENT:  CLINICAL IMPRESSION: Treatment session with focus on NMR of RUE with and without use of handheld NMES.  Pt with increased results with use of hand held [place] NMES with tricep activation this session.  Pt able to maintain movement with removal of NMES and complete in simulated fashion with equipment he would have at home.  OT discussed with pt and spouse discharge from OT at this time to allow for continued work at home with support of wife and sister in law to allow for remaining visits to be used later in year if/when increased motor recovery.  Pt's sister in law completes exercises with pt regularly.  OT aided in culling of exercises to continue to focus on appropriate exercises at home and provided education on how to increase and/or modify based on positioning and support as needed.  Pt and spouse in agreement with current plan and understand need for new referral if/when appropriate based on continued recovery and/or if any backslide.    PERFORMANCE DEFICITS in functional skills including ADLs, IADLs, coordination, sensation, tone, ROM, strength, pain, flexibility, FMC, GMC, mobility, balance, body mechanics, vision, and UE functional use and psychosocial skills including environmental adaptation and routines and behaviors.   IMPAIRMENTS are limiting patient from ADLs and IADLs.   COMORBIDITIES may have co-morbidities  that affects occupational performance. Patient will benefit  from skilled OT to address above impairments and improve overall function.   PLAN: OT FREQUENCY: 1-2x/week  OT DURATION: 4 weeks  PLANNED INTERVENTIONS: self care/ADL training, therapeutic exercise, therapeutic activity, neuromuscular re-education, manual therapy, passive range of motion, balance training, functional mobility training, splinting, electrical stimulation, compression bandaging, moist heat, cryotherapy, patient/family education, visual/perceptual remediation/compensation, psychosocial skills training, energy conservation, coping strategies training, and DME and/or AE instructions  RECOMMENDED OTHER SERVICES: N/A  CONSULTED AND AGREED WITH PLAN OF CARE: Patient and family member/caregiver  PLAN FOR NEXT SESSION:  D/C with plan to re-eval later in year if needed.   Rosalio Loud, OTR/L 02/23/2023, 12:47 PM

## 2023-02-23 NOTE — Therapy (Signed)
OUTPATIENT PHYSICAL THERAPY NEURO TREATMENT and D/C Summary   Patient Name: Gordon Fisher MRN: [id number] DOB:12-08-1960, 62 y.o., male Today's Date: 02/23/2023   PCP: None listed REFERRING PROVIDER: Fanny Dance, MD   PHYSICAL THERAPY DISCHARGE SUMMARY  Visits from Start of Care: 29  Current functional level related to goals / functional outcomes: FOTO: 51 TUG test 25 sec Functional mobility with supervision to modified indep performance   Remaining deficits: Step-to pattern stair ambulation   Education / Equipment: HEP   Patient agrees to discharge. Patient goals were partially met. Patient is being discharged due to being pleased with the current functional level.      PT End of Session - 02/23/23 1312     Visit Number 29    Number of Visits 40    Date for PT Re-Evaluation 02/24/23    Authorization Type [employer] 2024-PT, OT, speech combined 60 visits    Authorization - Visit Number 10    Authorization - Number of Visits 30    PT Start Time 1315    PT Stop Time 1400    PT Time Calculation (min) 45 min    Activity Tolerance Patient tolerated treatment well    Behavior During Therapy WFL for tasks assessed/performed                       Past Medical History:  Diagnosis Date   Stroke    Past Surgical History:  Procedure Laterality Date   BUBBLE STUDY  02/15/2022   Procedure: BUBBLE STUDY;  Surgeon: Christell Constant, MD;  Location: [facility];  Service: Cardiovascular;;   IR CT HEAD LTD  02/11/2022   IR CT HEAD LTD  02/11/2022   IR CT HEAD LTD  02/11/2022   IR PERCUTANEOUS ART THROMBECTOMY/INFUSION INTRACRANIAL INC DIAG ANGIO  02/11/2022   IR PERCUTANEOUS ART THROMBECTOMY/INFUSION INTRACRANIAL INC DIAG ANGIO  02/11/2022   IR US GUIDE VASC ACCESS RIGHT  02/11/2022   IR US GUIDE VASC ACCESS RIGHT  02/11/2022   RADIOLOGY WITH ANESTHESIA N/A 02/11/2022   Procedure: IR WITH ANESTHESIA;  Surgeon: Radiologist, Medication, MD;  Location: [facility] OR;   Service: Radiology;  Laterality: N/A;   RADIOLOGY WITH ANESTHESIA N/A 02/11/2022   Procedure: RADIOLOGY WITH ANESTHESIA;  Surgeon: Radiologist, Medication, MD;  Location: [facility] OR;  Service: Radiology;  Laterality: N/A;   TEE WITHOUT CARDIOVERSION N/A 02/15/2022   Procedure: TRANSESOPHAGEAL ECHOCARDIOGRAM (TEE);  Surgeon: Christell Constant, MD;  Location: [facility] ENDOSCOPY;  Service: Cardiovascular;  Laterality: N/A;   Patient Active Problem List   Diagnosis Date Noted   Hypocalcemia 03/22/2022   DVT, lower extremity, distal 03/22/2022   Obesity 03/22/2022   Elevated LDL cholesterol level 03/22/2022   Acute ischemic left middle cerebral artery (MCA) stroke 02/18/2022   Cerebrovascular accident (CVA) due to occlusion of left middle cerebral artery 02/11/2022    ONSET DATE: April 2023  REFERRING DIAG: I63.9 (ICD-10-CM) - Cerebrovascular accident (CVA), unspecified mechanism (HCC) R13.10 (ICD-10-CM) - Dysphagia, unspecified type   THERAPY DIAG:  Muscle weakness (generalized)  Hemiplegia and hemiparesis following cerebral infarction affecting right dominant side  Unsteadiness on feet  Difficulty in walking, not elsewhere classified  Other abnormalities of gait and mobility  Rationale for Evaluation and Treatment Rehabilitation  SUBJECTIVE:  SUBJECTIVE STATEMENT:  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  Code stroke was activated.  He received TNK and then transferred to [facility].  He underwent cerebral angiogram with thrombectomy.  Unfortunately vessel reoccluded later the day and the second thrombectomy was attempted.  Left M2  dissection was identified and treated with stent but unfortunately stent demonstrated reocclusion   PAIN:  Are you having pain? No 0/10  PRECAUTIONS: Shoulder and Fall, Aphasia   WEIGHT BEARING RESTRICTIONS No  FALLS: Has patient fallen in last 6 months? Yes. Number of falls 1, required 3-assist to recover  LIVING ENVIRONMENT: Lives with: lives with their spouse Lives in: House/apartment Stairs: Yes: Internal: flight steps; bilateral but cannot reach both and External: 4-5 steps; ramp is also present.  Has following equipment at home: Hemi walker, Wheelchair (manual), and sit-stand lift  PLOF: Independent  PATIENT GOALS regain independence  OBJECTIVE:   TODAY'S TREATMENT: 02/23/23 Activity Comments  FOTO survey 51% LE 42.9% UE  10 meter walk 23.87 sec = 1.3 to 1.4 ft/sec  TUG test 25 sec  Floor to stand trials Mod A initially, set-up assist leading with the RLE  Stair ambulation Modified indep with step-to pattern  Gait training W/ caregiver and [phone] for faster pace and reciprocal steps      Access Code: [email or web address] URL: [email or web address] Date: 12/28/2022 Prepared by: [facility] - Outpatient  Rehab - [facility]  Exercises - Forward Backward Weight Shift with Counter Support  - 1 x daily - 5 x weekly - 2 sets - 10 reps - Side to Side Weight Shift with Counter Support  - 1 x daily - 5 x weekly - 2 sets - 10 reps - Mini Squat with Counter Support  - 1 x daily - 5 x weekly - 2 sets - 10 reps - Stride Stance Weight Shift  - 1 x daily - 5 x weekly - 2 sets - 10 reps - Sit to stand in stride stance  - 2 x daily - 7 x weekly - 1-2 sets - 5 reps - Supine Bridge with Mini Swiss Ball Between Knees  - 1 x daily - 5 x weekly - 2 sets - 10 reps - Supine Heel Slide  - 1 x daily - 5 x weekly - 2 sets - 10 reps - Seated Isometric Hip Adduction with Ball  - 1 x daily - 5 x weekly - 2 sets - 10 reps - 3 sec hold - Alternating Step Taps with Counter Support  - 1 x daily  - 5 x weekly - 1 sets - 3-5 reps - Side Stepping with Counter Support  - 1 x daily - 5 x weekly - 1 sets - 3 reps - Prone Knee Flexion  - 1 x daily - 7 x weekly - 3 sets - 10 reps - Tall Kneeling Hip Hinge  - 1 x daily - 7 x weekly - 3 sets - 10 reps    PATIENT EDUCATION: Education details: updates on HEP Person educated: Patient and Spouse Education method: Explanation, Demonstration, Tactile cues, Verbal cues, and Handouts Education comprehension: verbalized understanding and returned demonstration      Below measures were taken at time of initial evaluation unless otherwise specified:     DIAGNOSTIC FINDINGS: Follow-up MRI brain done revealing small areas of acute infarct in left-MCA territory with mild hemorrhagic  transformation in left parietal lobe and left-MCA bifurcation residual vascular thrombus   COGNITION: Overall cognitive status:  Difficult to assess due to aphasia   SENSATION: Light touch: Impaired  Proprioception: Impaired   COORDINATION: RLE impaired, some deficits on left foot with alternating movements and isolated movements  EDEMA:  None   MUSCLE TONE: RLE: Modifed Ashworth Scale 2 = More marked increase in muscle tone through most of the ROM, but affected part(s) easily moved right quad most affected, no clonus right plantarflexors   MUSCLE LENGTH: Able to achieve full right knee extension to PROM  DTRs:  Patella 3+ = Brisk  POSTURE: No Significant postural limitations  LOWER EXTREMITY ROM:     LLE WNL, RLE AROM limited by weakness  LOWER EXTREMITY MMT:    MMT Right Eval Left Eval  Hip flexion 3- 5  Hip extension    Hip abduction 3- 4  Hip adduction 3- 5  Hip internal rotation    Hip external rotation    Knee flexion 3 5  Knee extension 2+ 5  Ankle dorsiflexion 0 5  Ankle plantarflexion 1 3+  Ankle inversion    Ankle eversion    (Blank rows = not tested)  BED MOBILITY:  NT  TRANSFERS: Assistive device utilized: Hemi walker   Sit to stand: Modified independence Stand to sit: Modified independence Chair to chair: Modified independence Floor:  Total assist --mod A for half kneeling  RAMP:  Level of Assistance: CGA Assistive device utilized: Hemi walker Ramp Comments:   CURB:  Level of Assistance: Min A Assistive device utilized: Education officer, environmental Comments: cues in sequence  STAIRS:  Level of Assistance: SBA  Stair Negotiation Technique: Step to Pattern with Single Rail on Left  Number of Stairs: 15   Height of Stairs: 4-6"  Comments: cues and guarding  GAIT: Gait pattern: step to pattern, decreased ankle dorsiflexion- Right, and circumduction- Right Distance walked: 150 Assistive device utilized: Quad cane large base Level of assistance: Modified independence Comments: deficits during turning   FOTO 47%  TODAY'S TREATMENT:  assessment   PATIENT EDUCATION: Education details: assessment findings, PT scope of practice Person educated: Patient and Spouse Education method: Explanation Education comprehension: verbalized understanding     GOALS: Goals reviewed with patient? Yes  SHORT TERM GOALS: Target date: 01/13/2023      Patient will perform Advanced HEP with family/caregiver supervision for improved strength, balance, transfers, and gait  Baseline: indep with initial HEP Goal status: MET  2.  Demo improved safety and ability to access home environment ambulating grass and uneven surfaces w/ SBA and least restrictive AD Baseline: Modified indep w/ WBQC Goal status: MET   4. Demonstrate improved gait speed to 1.8 ft/sec with right AFO and least restrictive AD to improve efficiency of gait  Baseline: 0.68 ft/sec at eval; (09/20/22) 1.59 ft/sec w/ platform RW and AFO; (12/02/22) 1.4 ft/sec w/ [facility] and AFO; (02/23/23) 1.4 ft/sec  Goal status: NOT MET  5. Improve mobility and reduce level of assist performing floor to stand transfers w/ SBA  Baseline: max A; set-up to  supervision  Goal status: MET  LONG TERM GOALS: Target date: 02/24/2023    Demonstrate improved functional status and quality of life meeting FOTO predicted outcomes (59% predicted) Baseline: 47%; 51% Goal status:NOT MET  2.  Demonstrate modified independent ambulation x 300 ft uneven surfaces using least restrictive AD in order to improve safety with home environment Baseline: Modified indep with household and outdoors of home environment  Goal status: MET  3.  Manifest improved safety with gait per time of 18 sec TUG test using least restrictive AD Baseline: 34.75 sec w/ HW; (09/20/22) 30.69 w/ HW and right AFO; (12/02/22) 23 sec w/ WBQC; (02/23/23) 25 sec Goal status: NOT MET   4. Stair ambulation x 15 steps with set-up assist to improve safety in home environment and reduce burden of care  Baseline: SBA with single HR, using AFO; (12/02/22) supervision  Goal status: MET      ASSESSMENT:  CLINICAL IMPRESSION: Overall improved performance since start of care and progressed with functional mobility from CGA-min A to now ambulation and stair negotiation with set-up to modified independence over various surfaces w/ [facility] and right AFO.  Continues to exhibit high risk for falls per TUG test time but demo improved stride length in gait and CGA-min A with floor to stand using furniture for support.  FOTO survey improved from initial 47 to 51. Pt has supportive family assisting at home with activities and will transition to home-based program working with them.   OBJECTIVE IMPAIRMENTS Abnormal gait, decreased activity tolerance, decreased balance, decreased coordination, decreased knowledge of use of DME, decreased mobility, difficulty walking, decreased strength, impaired sensation, impaired tone, impaired UE functional use, and improper body mechanics.   ACTIVITY LIMITATIONS carrying, lifting, bending, standing, squatting, stairs, transfers, bathing, toileting, dressing, reach over head, and  locomotion level  PARTICIPATION LIMITATIONS: meal prep, cleaning, laundry, interpersonal relationship, driving, shopping, community activity, occupation, yard work, and leisure activities  PERSONAL FACTORS Time since onset of injury/illness/exacerbation are also affecting patient's functional outcome.   REHAB POTENTIAL: Excellent  CLINICAL DECISION MAKING: Evolving/moderate complexity  EVALUATION COMPLEXITY: Moderate  PLAN: PT FREQUENCY: 1-2/wk  PT DURATION: 12 weeks  PLANNED INTERVENTIONS: Therapeutic exercises, Therapeutic activity, Neuromuscular re-education, Balance training, Gait training, Patient/Family education, Self Care, Joint mobilization, Stair training, Vestibular training, Canalith repositioning, Orthotic/Fit training, DME instructions, Aquatic Therapy, Dry Needling, Electrical stimulation, Spinal mobilization, Cryotherapy, Moist heat, Splintting, Taping, and Manual therapy  PLAN FOR NEXT SESSION: D/C to HEP     1:13 PM, 02/23/23 M. Shary Decamp, PT, DPT Physical Therapist- [place] Office Number: [phone]

## 2023-03-17 ENCOUNTER — Ambulatory Visit (INDEPENDENT_AMBULATORY_CARE_PROVIDER_SITE_OTHER): Payer: 59

## 2023-03-17 DIAGNOSIS — I639 Cerebral infarction, unspecified: Secondary | ICD-10-CM | POA: Diagnosis not present

## 2023-03-17 NOTE — Progress Notes (Signed)
Merlin Loop Recorder 

## 2023-03-18 LAB — CUP PACEART REMOTE DEVICE CHECK
Date Time Interrogation Session: 20240517080104
Implantable Pulse Generator Implant Date: 20230807
Pulse Gen Serial Number: 511016023

## 2023-03-31 NOTE — Progress Notes (Signed)
[facility] Summary Report / Loop Recorder 

## 2023-04-18 ENCOUNTER — Ambulatory Visit (INDEPENDENT_AMBULATORY_CARE_PROVIDER_SITE_OTHER): Payer: 59

## 2023-04-18 DIAGNOSIS — I639 Cerebral infarction, unspecified: Secondary | ICD-10-CM

## 2023-04-20 LAB — CUP PACEART REMOTE DEVICE CHECK
Date Time Interrogation Session: 20240617105905
Implantable Pulse Generator Implant Date: 20230807
Pulse Gen Serial Number: 511016023

## 2023-04-28 NOTE — Progress Notes (Signed)
  Electrophysiology Office Note:   Date:  04/29/2023  ID:  Gordon Fisher, DOB 05/17/1961, MRN [id number]  Primary Cardiologist: None Electrophysiologist: Lanier Prude, MD      History of Present Illness:   Gordon Fisher is a 62 y.o. male with h/o obesity, LLE DVT, L MCA CVV who returns 6/27 for routine electrophysiology followup.   He suffered a L MCA CVA in 01/2022. Monitor 03/2022 showed HR 55-188 with 2 episodes of NSVT, rhythm strip appeared to be ATach.  The patient subsequently had an ILR placed 06/2022 in the setting of cryptogenic stroke. The last ILR remote check 04/18/2023 showed normal battery, no new AF episodes / arrhythmias, normal histogram.   Since last being seen in our clinic the patient reports doing very well. He has finished his planned PT and continues to work at home. His extended family supports him as well.  He questions if he can take his loop out.    He denies chest pain, palpitations, dyspnea, PND, orthopnea, nausea, vomiting, dizziness, syncope, edema, weight gain, or early satiety.   Review of systems complete and found to be negative unless listed in HPI.   EP information / Studies Reviewed:    EKG is ordered today. Personal review as below.  EKG Interpretation Date/Time:  Friday April 29 2023 08:22:46 EDT Ventricular Rate:  75 PR Interval:  158 QRS Duration:  80 QT Interval:  354 QTC Calculation: 395 R Axis:   47  Text Interpretation: Normal sinus rhythm Normal ECG When compared with ECG of 04-Mar-2022 15:13, No significant change was found Confirmed by Canary Brim ([id number]) on 04/29/2023 8:50:46 AM   LINQ II > interrogated, no new events, histograms normal.           Physical Exam:   VS:  BP 110/78   Pulse 77   Ht 5\' 8"  (1.727 m)   Wt 184 lb 9.6 oz (83.7 kg)   SpO2 98%   BMI 28.07 kg/m    Wt Readings from Last 3 Encounters:  04/29/23 184 lb 9.6 oz (83.7 kg)  01/06/23 182 lb 8 oz (82.8 kg)  12/10/22 159 lb 3.2 oz (72.2 kg)     GEN:  Well nourished, well developed in no acute distress NECK: No JVD; No carotid bruits CARDIAC: Regular rate and rhythm, no murmurs, rubs, gallops RESPIRATORY:  Clear to auscultation without rales, wheezing or rhonchi  ABDOMEN: Soft, non-tender, non-distended EXTREMITIES:  No edema; No deformity   ASSESSMENT AND PLAN:    ILR  L MCA Cryptogenic Stroke  M1 occlusion s/p TNK, thrombectomyx2, stent with re-occlusion. EF 60-65%, negative bubble study.   -device check within normal limits, no new episodes / AF  -EKG stable/NSR  -pt asks about removing loop, reviewed with patient and wife that loop can be removed if he wants, he would just call to schedule. Discussed stroke protection with eliquis.   Provoked DVT  -hypercoagulable work up largely negative with Hematology  -LE clot resolved on [place], but rec's to continue eliquis per Hematology due to decreased functional status from CVA  Secondary Hypercoagulable State -as above  Follow up with Dr. Lalla Brothers in 12 months or as needed if new concerns.   Signed, Canary Brim, MSN, APRN, NP-C, AGACNP-BC [facility] - Electrophysiology  04/29/2023, 8:55 AM

## 2023-04-29 ENCOUNTER — Ambulatory Visit: Payer: 59 | Attending: Student | Admitting: Pulmonary Disease

## 2023-04-29 ENCOUNTER — Encounter: Payer: Self-pay | Admitting: Student

## 2023-04-29 VITALS — BP 110/78 | HR 77 | Ht 68.0 in | Wt 184.6 lb

## 2023-04-29 DIAGNOSIS — I493 Ventricular premature depolarization: Secondary | ICD-10-CM | POA: Diagnosis not present

## 2023-04-29 DIAGNOSIS — I63512 Cerebral infarction due to unspecified occlusion or stenosis of left middle cerebral artery: Secondary | ICD-10-CM | POA: Diagnosis not present

## 2023-04-29 DIAGNOSIS — I639 Cerebral infarction, unspecified: Secondary | ICD-10-CM

## 2023-04-29 NOTE — Patient Instructions (Signed)
Medication Instructions:  Your physician recommends that you continue on your current medications as directed. Please refer to the Current Medication list given to you today.  *If you need a refill on your cardiac medications before your next appointment, please call your pharmacy*   Lab Work: None ordered If you have labs (blood work) drawn today and your tests are completely normal, you will receive your results only by: MyChart Message (if you have MyChart) OR A paper copy in the mail If you have any lab test that is abnormal or we need to change your treatment, we will call you to review the results   Follow-Up: At [facility], you and your health needs are our priority.  As part of our continuing mission to provide you with exceptional heart care, we have created designated Provider Care Teams.  These Care Teams include your primary Cardiologist (physician) and Advanced Practice Providers (APPs -  Physician Assistants and Nurse Practitioners) who all work together to provide you with the care you need, when you need it.  Your next appointment:   1 year(s)  Provider:   Cameron Lambert, MD  

## 2023-05-06 NOTE — Progress Notes (Signed)
Merlin Loop Recorder 

## 2023-06-20 ENCOUNTER — Ambulatory Visit (INDEPENDENT_AMBULATORY_CARE_PROVIDER_SITE_OTHER): Payer: 59

## 2023-06-20 DIAGNOSIS — I639 Cerebral infarction, unspecified: Secondary | ICD-10-CM | POA: Diagnosis not present

## 2023-06-20 LAB — CUP PACEART REMOTE DEVICE CHECK
Date Time Interrogation Session: 20240818083233
Implantable Pulse Generator Implant Date: 20230807
Pulse Gen Serial Number: 511016023

## 2023-06-29 NOTE — Progress Notes (Signed)
Merlin Loop Recorder  

## 2023-07-07 ENCOUNTER — Inpatient Hospital Stay: Payer: 59 | Attending: Hematology

## 2023-07-07 DIAGNOSIS — Z7902 Long term (current) use of antithrombotics/antiplatelets: Secondary | ICD-10-CM | POA: Diagnosis not present

## 2023-07-07 DIAGNOSIS — Z8673 Personal history of transient ischemic attack (TIA), and cerebral infarction without residual deficits: Secondary | ICD-10-CM | POA: Diagnosis not present

## 2023-07-07 DIAGNOSIS — Z86718 Personal history of other venous thrombosis and embolism: Secondary | ICD-10-CM | POA: Insufficient documentation

## 2023-07-07 DIAGNOSIS — I824Z1 Acute embolism and thrombosis of unspecified deep veins of right distal lower extremity: Secondary | ICD-10-CM

## 2023-07-07 DIAGNOSIS — Z7901 Long term (current) use of anticoagulants: Secondary | ICD-10-CM | POA: Diagnosis not present

## 2023-07-07 DIAGNOSIS — Z79899 Other long term (current) drug therapy: Secondary | ICD-10-CM | POA: Insufficient documentation

## 2023-07-07 LAB — D-DIMER, QUANTITATIVE: D-Dimer, Quant: <0.27 ug{FEU}/mL (ref 0.00–0.50)

## 2023-07-08 LAB — BETA-2-GLYCOPROTEIN I ABS, IGG/M/A
Beta-2 Glyco I IgG: <9 GPI IgG units (ref 0–20)
Beta-2-Glycoprotein I IgA: <9 GPI IgA units (ref 0–25)
Beta-2-Glycoprotein I IgM: <9 GPI IgM units (ref 0–32)

## 2023-07-14 ENCOUNTER — Encounter: Payer: Self-pay | Admitting: Oncology

## 2023-07-14 ENCOUNTER — Inpatient Hospital Stay (HOSPITAL_BASED_OUTPATIENT_CLINIC_OR_DEPARTMENT_OTHER): Payer: 59 | Admitting: Oncology

## 2023-07-14 VITALS — BP 108/74 | HR 76 | Temp 97.8°F | Resp 18 | Wt 186.0 lb

## 2023-07-14 DIAGNOSIS — I824Z1 Acute embolism and thrombosis of unspecified deep veins of right distal lower extremity: Secondary | ICD-10-CM | POA: Diagnosis not present

## 2023-07-14 DIAGNOSIS — Z86718 Personal history of other venous thrombosis and embolism: Secondary | ICD-10-CM | POA: Diagnosis not present

## 2023-07-14 NOTE — Progress Notes (Signed)
[facility] [address] [place] [address]   Clinic Day:  07/19/2023  Referring physician: No ref. provider found  Patient Care Team: Pcp, No as PCP - General Lanier Prude, MD as PCP - Electrophysiology (Cardiology) Doreatha Massed, MD as Medical Oncologist (Hematology)   ASSESSMENT & PLAN:   Assessment:  1.  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  - 2D echo, EF 60-65%, interatrial septum not well-visualized. - TEE (02/15/2022): -Negative bubble study, no cardiac thrombus.  No evidence of atrial fibrillation or arrhythmias by loop recorder. - Started on aspirin 81 mg and Brilinta 90 mg twice daily, aspirin stopped when Eliquis was started for DVT.  Brilinta discontinued in October 2023 and he is currently on aspirin and Eliquis. - 25 pound weight loss since October 2023.  He stopped drinking soda and eating sweets.  2.  Provoked right leg distal DVT: - Doppler on 03/05/2022: Acute DVT involving the right gastrinomas veins, right peroneal veins. - Doppler on 03/10/2022: Acute DVT involving right gastrinomas, right peroneal veins. - Doppler on 05/10/2022: Age-indeterminate DVT involving right peroneal vein.  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 - Currently on Eliquis twice daily.  3.  Social/family history: - Lives at home with his wife.  He worked in [employer], special forces.  He was stationed in [place] and also in the [place].  Exposure to burnt pits.  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 - Non-smoker and nonalcoholic. - No family history of DVT.  Sister had mild CVA at a young age.  Maternal grandmother had cancer.  Son has tuberosclerosis.  Plan:  1.  Provoked right leg distal DVT: - Secondary to stroke and immobility. -Hematology workup  included a negative lupus anticoagulant, anticardiolipin antibodies and normal D-dimer.  Factor V Leiden and prothrombin gene mutations were all negative.  JAK2 V6 10F mutation was also negative. -Beta-2 glycoprotein from 07/07/2023 were unremarkable and D-dimer was WNL. -Repeat ultrasound Doppler  (12/20/22) of right leg was negative for DVT. -He continues on Eliquis twice a day due to functional status and increased risk for recurrent blood clot. -Dr. Carl Best is his PCP with [facility].  Patient's wife Gordon Fisher is asking that we have records sent over to PCP. -Recommend follow-up in 1 year with labs a few days before.  PLAN SUMMARY: >> Recommend follow-up in 1 year with labs a few days before.  >> Continue Eliquis under [facility].      No orders of the defined types were placed in this encounter.   Gordon Kaufmann, NP   9/17/202412:53 PM  CHIEF COMPLAINT/PURPOSE OF CONSULT:   Diagnosis: DVT/stroke  Current Therapy:  Eliquis  HISTORY OF PRESENT ILLNESS:   Gordon Fisher is a 62 y.o. male presenting to clinic today for follow up with history of DVT and stroke. He was last seen by Dr. Ellin Saba on 01/06/2023.  He presents today with his wife.  He continues to improve each day.  Appetite is 100% energy levels are 75%.  He is positive for fatigue.  Denies any pain.  He is using a cane for ambulation.  Has residual right sided weakness secondary to his stroke.  Has expressive aphasia but this is slowly improving.  He denies any recurrent strokelike symptoms.  He denies any bleeding, melena or hematochezia.  Reports he is overall stable.  His anticoagulation is managed by Dr. Carl Best at the [facility].  Wife believes they have decreased his anticoagulation by half as of the other day.    PAST MEDICAL HISTORY:   Past Medical History: Past Medical History:  Diagnosis Date   Stroke [facility])     Surgical History: Past Surgical History:   Procedure Laterality Date   BUBBLE STUDY  02/15/2022   Procedure: BUBBLE STUDY;  Surgeon: Christell Constant, MD;  Location: [facility] ENDOSCOPY;  Service: Cardiovascular;;   IR CT HEAD LTD  02/11/2022   IR CT HEAD LTD  02/11/2022   IR CT HEAD LTD  02/11/2022   IR PERCUTANEOUS ART THROMBECTOMY/INFUSION INTRACRANIAL INC DIAG ANGIO  02/11/2022   IR PERCUTANEOUS ART THROMBECTOMY/INFUSION INTRACRANIAL INC DIAG ANGIO  02/11/2022   IR US GUIDE VASC ACCESS RIGHT  02/11/2022   IR US GUIDE VASC ACCESS RIGHT  02/11/2022   RADIOLOGY WITH ANESTHESIA N/A 02/11/2022   Procedure: IR WITH ANESTHESIA;  Surgeon: Radiologist, Medication, MD;  Location: [facility] OR;  Service: Radiology;  Laterality: N/A;   RADIOLOGY WITH ANESTHESIA N/A 02/11/2022   Procedure: RADIOLOGY WITH ANESTHESIA;  Surgeon: Radiologist, Medication, MD;  Location: [facility] OR;  Service: Radiology;  Laterality: N/A;   TEE WITHOUT CARDIOVERSION N/A 02/15/2022   Procedure: TRANSESOPHAGEAL ECHOCARDIOGRAM (TEE);  Surgeon: Christell Constant, MD;  Location: [facility] ENDOSCOPY;  Service: Cardiovascular;  Laterality: N/A;    Social History: Social History   Socioeconomic History   Marital status: Married    Spouse name: Gordon Fisher   Number of children: 2   Years of education: Not on file   Highest education level: Associate degree: occupational, Scientist, product/process development, or vocational program  Occupational History   Not on file  Tobacco Use   Smoking status: Never    Passive exposure: Never   Smokeless tobacco: Never  Vaping Use   Vaping status: Never Used  Substance and Sexual Activity   Alcohol use: Never   Drug use: Never   Sexual activity: Never  Other Topics Concern   Not on file  Social History Narrative   04/27/22 lives with wife   Social Determinants of Health   Financial Resource Strain: Not on file  Food Insecurity: No Food Insecurity (12/10/2022)   Hunger Vital Sign    Worried About Running Out of Food in the Last Year: Never true    Ran Out of Food in the  Last Year: Never true  Transportation Needs: No Transportation Needs (12/10/2022)   PRAPARE - Administrator, Civil Service (Medical): No    Lack of Transportation (Non-Medical): No  Physical Activity: Not on file  Stress: Not on file  Social Connections: Not on file  Intimate Partner Violence: Not At Risk (12/10/2022)   Humiliation, Afraid, Rape, and Kick questionnaire    Fear of Current or Ex-Partner: No    Emotionally Abused: No    Physically Abused: No    Sexually Abused: No    Family History: Family History  Problem Relation Age of Onset   Thyroid disease Mother     Current Medications:  Current Outpatient Medications:    apixaban (ELIQUIS) 5 MG TABS tablet, TAKE ONE (1) TABLET BY MOUTH TWO (2) TIMES DAILY, Disp: 60 tablet, Rfl: 0   atorvastatin (LIPITOR) 40 MG tablet, Take 1 tablet (40 mg total) by mouth every evening., Disp: 30 tablet, Rfl: 1  Current Facility-Administered Medications:  diclofenac Sodium (VOLTAREN) 1 % topical gel 2 g, 2 g, Topical, QID, Fanny Dance, MD   Allergies: No Known Allergies  REVIEW OF SYSTEMS:   Review of Systems  Constitutional:  Positive for fatigue.  Musculoskeletal:  Positive for gait problem.  Neurological:  Positive for extremity weakness (Right side), gait problem and numbness.     VITALS:   Blood pressure 108/74, pulse 76, temperature 97.8 F (36.6 C), temperature source Oral, resp. rate 18, weight 186 lb (84.4 kg), SpO2 96%.  Wt Readings from Last 3 Encounters:  07/14/23 186 lb (84.4 kg)  04/29/23 184 lb 9.6 oz (83.7 kg)  01/06/23 182 lb 8 oz (82.8 kg)    Body mass index is 28.28 kg/m.  PHYSICAL EXAM:   Physical Exam Constitutional:      Appearance: Normal appearance.  Cardiovascular:     Rate and Rhythm: Normal rate and regular rhythm.  Pulmonary:     Effort: Pulmonary effort is normal.     Breath sounds: Normal breath sounds.  Abdominal:     General: Bowel sounds are normal.     Palpations:  Abdomen is soft.  Musculoskeletal:        General: No swelling. Normal range of motion.  Neurological:     Mental Status: He is alert and oriented to person, place, and time. Mental status is at baseline.     Gait: Gait abnormal.     Comments: Right sided weakess  Psychiatric:        Speech: Speech is slurred (Aphasia).     LABS:      Latest Ref Rng & Units 03/15/2022    6:31 AM 03/08/2022    5:28 AM 03/05/2022    5:56 PM  CBC  WBC 4.0 - 10.5 K/uL 7.1  5.7  15.4   Hemoglobin 13.0 - 17.0 g/dL 16.1  09.6  04.5   Hematocrit 39.0 - 52.0 % 43.3  41.7  44.4   Platelets 150 - 400 K/uL 230  184  205       Latest Ref Rng & Units 03/15/2022    6:31 AM 03/08/2022    5:28 AM 03/01/2022    6:01 AM  CMP  Glucose 70 - 99 mg/dL 409  811  914   BUN 6 - 20 mg/dL 13  12  18    Creatinine 0.61 - 1.24 mg/dL 7.82  9.56  2.13   Sodium 135 - 145 mmol/L 135  135  137   Potassium 3.5 - 5.1 mmol/L 3.7  3.7  3.7   Chloride 98 - 111 mmol/L 105  106  105   CO2 22 - 32 mmol/L 23  23  24    Calcium 8.9 - 10.3 mg/dL 8.9  8.9  8.8      No results found for: "CEA1", "CEA" / No results found for: "CEA1", "CEA" No results found for: "PSA1" No results found for: "YQM578" No results found for: "CAN125"  No results found for: "TOTALPROTELP", "ALBUMINELP", "A1GS", "A2GS", "BETS", "BETA2SER", "GAMS", "MSPIKE", "SPEI" No results found for: "TIBC", "FERRITIN", "IRONPCTSAT" No results found for: "LDH"   STUDIES:   No results found.

## 2023-07-18 ENCOUNTER — Encounter: Payer: Self-pay | Admitting: *Deleted

## 2023-07-18 NOTE — Progress Notes (Signed)
Recent labs from September and February faxed to patient's PCP @ [facility], Dr. Jonn Shingles - P [phone] F [phone].

## 2023-07-21 ENCOUNTER — Ambulatory Visit (INDEPENDENT_AMBULATORY_CARE_PROVIDER_SITE_OTHER): Payer: 59

## 2023-07-21 DIAGNOSIS — I639 Cerebral infarction, unspecified: Secondary | ICD-10-CM

## 2023-08-01 NOTE — Progress Notes (Signed)
[facility] Summary Report / Loop Recorder 

## 2023-08-11 ENCOUNTER — Other Ambulatory Visit: Payer: Self-pay

## 2023-08-22 ENCOUNTER — Ambulatory Visit (INDEPENDENT_AMBULATORY_CARE_PROVIDER_SITE_OTHER): Payer: 59

## 2023-08-22 DIAGNOSIS — I639 Cerebral infarction, unspecified: Secondary | ICD-10-CM

## 2023-08-22 DIAGNOSIS — I63512 Cerebral infarction due to unspecified occlusion or stenosis of left middle cerebral artery: Secondary | ICD-10-CM

## 2023-08-22 LAB — CUP PACEART REMOTE DEVICE CHECK
Date Time Interrogation Session: 20241019191814
Implantable Pulse Generator Implant Date: 20230807
Pulse Gen Serial Number: 511016023

## 2023-09-06 NOTE — Progress Notes (Signed)
Merlin Loop Recorder  

## 2023-09-18 ENCOUNTER — Emergency Department (HOSPITAL_COMMUNITY): Payer: 59

## 2023-09-18 ENCOUNTER — Emergency Department (HOSPITAL_COMMUNITY)
Admission: EM | Admit: 2023-09-18 | Discharge: 2023-09-18 | Disposition: A | Discharge status: Home / Self Care | Payer: 59 | Attending: Emergency Medicine | Admitting: Emergency Medicine

## 2023-09-18 ENCOUNTER — Encounter (HOSPITAL_COMMUNITY): Payer: Self-pay | Admitting: Emergency Medicine

## 2023-09-18 ENCOUNTER — Other Ambulatory Visit: Payer: Self-pay

## 2023-09-18 DIAGNOSIS — S39012A Strain of muscle, fascia and tendon of lower back, initial encounter: Secondary | ICD-10-CM | POA: Insufficient documentation

## 2023-09-18 DIAGNOSIS — Z8673 Personal history of transient ischemic attack (TIA), and cerebral infarction without residual deficits: Secondary | ICD-10-CM | POA: Insufficient documentation

## 2023-09-18 DIAGNOSIS — X58XXXA Exposure to other specified factors, initial encounter: Secondary | ICD-10-CM | POA: Insufficient documentation

## 2023-09-18 DIAGNOSIS — Z7901 Long term (current) use of anticoagulants: Secondary | ICD-10-CM | POA: Diagnosis not present

## 2023-09-18 DIAGNOSIS — M545 Low back pain, unspecified: Secondary | ICD-10-CM | POA: Diagnosis present

## 2023-09-18 MED ORDER — NAPROXEN 375 MG PO TABS
375.0000 mg | ORAL_TABLET | Freq: Two times a day (BID) | ORAL | 0 refills | Status: DC
Start: 2023-09-18 — End: 2023-11-08

## 2023-09-18 MED ORDER — HYDROCODONE-ACETAMINOPHEN 5-325 MG PO TABS
1.0000 | ORAL_TABLET | Freq: Once | ORAL | Status: AC
  Administered 2023-09-18: 1 via ORAL
  Filled 2023-09-18: qty 1

## 2023-09-18 MED ORDER — METHOCARBAMOL 500 MG PO TABS: 500.0000 mg | ORAL_TABLET | Freq: Two times a day (BID) | ORAL | 0 refills | Status: DC

## 2023-09-18 MED ORDER — DEXAMETHASONE SODIUM PHOSPHATE 10 MG/ML IJ SOLN
10.0000 mg | Freq: Once | INTRAMUSCULAR | Status: AC
  Administered 2023-09-18: 10 mg via INTRAMUSCULAR
  Filled 2023-09-18: qty 1

## 2023-09-18 NOTE — Discharge Instructions (Addendum)
Please take Robaxin, 500 mg up to twice a day as needed for muscle spasm, this is a muscle relaxer, it may cause generalized weakness, sleepiness and you should not drive or do important things while taking this medication. Take 1 tablet of 375 mg tablet naproxen every 12 hours as needed.  This is the NSAID that will help with inflammation. Continue with small movements.  Follow-up with PCP needing more pain management.. Return to the ER if worsening pain, numbness, tingling, fever, inability to urinate fully.

## 2023-09-18 NOTE — ED Triage Notes (Signed)
Pt c/o left lower quad pain and lower back pain since Friday. He denies any injury.

## 2023-09-18 NOTE — ED Provider Notes (Signed)
[place] EMERGENCY DEPARTMENT AT [facility] Provider Note   CSN: [id number] Arrival date & time: 09/18/23  1851     History  Chief Complaint  Patient presents with   Back Pain    Gordon Fisher is a 62 y.o. male.  Patient presents with his wife complaining of a a 5-day history of low back pain starting after he began doing a new exercise with PT carrying a backpack.  Wife provided story as patient suffers from aphasia from previous CVA also having deficits on the right side.  Wife states that the pain began in the right hip and then began to develop left-sided back pain.  The pain has gotten worse over the last couple of days leading to today where he was unable to get off the toilet and needed to have EMS called to help bring him to the ER. Pain is relieved with heat and ice, IcyHot, and rest.    Back Pain      Home Medications Prior to Admission medications   Medication Sig Start Date End Date Taking? Authorizing Provider  methocarbamol (ROBAXIN) 500 MG tablet Take 1 tablet (500 mg total) by mouth 2 (two) times daily. 09/18/23  Yes Lunette Stands, PA-C  naproxen (NAPROSYN) 375 MG tablet Take 1 tablet (375 mg total) by mouth 2 (two) times daily. 09/18/23  Yes Lunette Stands, PA-C  apixaban (ELIQUIS) 5 MG TABS tablet TAKE ONE (1) TABLET BY MOUTH TWO (2) TIMES DAILY 05/19/22   Fanny Dance, MD  atorvastatin (LIPITOR) 40 MG tablet Take 1 tablet (40 mg total) by mouth every evening. 04/01/22   Fanny Dance, MD      Allergies    Patient has no known allergies.    Review of Systems   Review of Systems  Musculoskeletal:  Positive for back pain.  All other systems reviewed and are negative.   Physical Exam Updated Vital Signs BP 128/87   Pulse 87   Temp 98 F (36.7 C) (Oral)   Resp 18   Ht 5\' 8"  (1.727 m)   Wt 85 kg   SpO2 98%   BMI 28.49 kg/m  Physical Exam Vitals and nursing note reviewed.  Constitutional:      General: He is not in acute  distress.    Appearance: He is not ill-appearing.  Cardiovascular:     Rate and Rhythm: Normal rate and regular rhythm.     Pulses: Normal pulses.     Heart sounds: Normal heart sounds. No murmur heard.    No friction rub. No gallop.  Pulmonary:     Effort: Pulmonary effort is normal. No respiratory distress.     Breath sounds: Normal breath sounds. No stridor. No wheezing or rales.  Chest:     Chest wall: No tenderness.  Abdominal:     General: Abdomen is flat. There is no distension.     Palpations: Abdomen is soft. There is no mass.     Tenderness: There is no abdominal tenderness. There is no guarding or rebound.     Hernia: No hernia is present.  Musculoskeletal:        General: Tenderness present. No deformity or signs of injury.     Cervical back: Normal range of motion. No tenderness.  Skin:    General: Skin is warm and dry.  Neurological:     Mental Status: He is alert. Mental status is at baseline.     Motor: Weakness (Right-sided weakness noted from previous  CVA) present.     ED Results / Procedures / Treatments   Labs (all labs ordered are listed, but only abnormal results are displayed) Labs Reviewed - No data to display  EKG None  Radiology DG Lumbar Spine 1 View  Result Date: 09/18/2023 CLINICAL DATA:  Pain in the lumbar region. EXAM: LUMBAR SPINE - 1 VIEW COMPARISON:  November 1st 2005 FINDINGS: There is no evidence of lumbar spine fracture. Alignment is normal. Intervertebral disc spaces are maintained. IMPRESSION: Negative. Electronically Signed   By: Ted Mcalpine M.D.   On: 09/18/2023 20:58   CT Lumbar Spine Wo Contrast  Result Date: 09/18/2023 CLINICAL DATA:  Low back pain.  Concern for fracture. EXAM: CT LUMBAR SPINE WITHOUT CONTRAST TECHNIQUE: Multidetector CT imaging of the lumbar spine was performed without intravenous contrast administration. Multiplanar CT image reconstructions were also generated. RADIATION DOSE REDUCTION: This exam was  performed according to the departmental dose-optimization program which includes automated exposure control, adjustment of the Fisher and/or kV according to patient size and/or use of iterative reconstruction technique. COMPARISON:  Lumbar spine radiograph dated 09/18/2023. FINDINGS: Segmentation: 5 lumbar type vertebrae. Alignment: No acute subluxation. Vertebrae: No acute fracture. Partially visualized faint 13 mm sclerotic focus in the left upper T11 vertebra (1/3), nonspecific. If there is history of known malignancy or clinical concern for metastatic disease. Dedicated MRI of the thoracic spine recommended for better evaluation. Paraspinal and other soft tissues: Negative. Disc levels: No acute findings. Mild degenerative changes with disc space narrowing at L3-L4 and L4-5. There is disc desiccation and vacuum phenomena at L4-5. There is diffuse disc bulge at L3-L4 and L5-S1. IMPRESSION: 1. No acute/traumatic lumbar spine pathology. 2. Mild degenerative changes at L3-L4 and L4-5. 3. Partially visualized faint 13 mm sclerotic focus in the left upper T11 vertebra, nonspecific. Electronically Signed   By: Elgie Collard M.D.   On: 09/18/2023 20:57    Procedures Procedures    Medications Ordered in ED Medications  HYDROcodone-acetaminophen (NORCO/VICODIN) 5-325 MG per tablet 1 tablet (1 tablet Oral Given 09/18/23 2210)  dexamethasone (DECADRON) injection 10 mg (10 mg Intramuscular Given 09/18/23 2324)    ED Course/ Medical Decision Making/ A&P                                 Medical Decision Making    Patient presents to the ED for concern of low back pain, this involves an extensive number of treatment options, and is a complaint that carries with it a high risk of complications and morbidity.  The differential diagnosis includes Patient with back pain.  My emergent differential diagnosis includes slipped disc, compression fracture, spondylolisthesis, less clinical concern for epidural abscess or  osteomyelitis based on patient history.    Co morbidities that complicate the patient evaluation  Previous CVA with ataxia, apraxia, aphasia, right sided deficits   Additional history obtained:  Additional history obtained from  EMS, Family, Nursing, Outside Medical Records, and Past Admission    Lab Tests:  No labs necessary for this visit.    Imaging Studies ordered:  I ordered imaging studies including CT of lumbar spine I independently visualized and interpreted imaging which showed no acute pathology.  Chronic pathology including generative changes and sclerotic focus were noted.  No emergent pathology present I agree with the radiologist interpretation   Cardiac Monitoring:  No cardiac monitoring was necessary   Medicines ordered and prescription drug management:  I  ordered medication including Norco and Decadron for pain and inflammation relief Reevaluation of the patient after these medicines showed that the patient improved I have reviewed the patients home medicines and have made adjustments as needed   Test Considered:  CBC, CMP, UA, CT abdomen --were not done because of low risk symptoms and presentation.   Critical Interventions:  Norco and Decadron were provided to help pain relief so that patient was able to get inside the house when discharged Consultations Obtained:  I requested consultation with the ending,  and discussed lab and imaging findings as well as pertinent plan - they recommend: Agreed with my plan and recommended patient to return home, take 1 week of rest, continue with Robaxin and naproxen as pain relief and muscle relaxant control.   Problem List / ED Course:  Low back strain --patient is 62 year old male presenting to the ED today after being unable to get off the toilet due to progressive back pain that started 5 days ago.  The pain began 1 day after going to PT where he was doing an exercise that involves wearing a weighted  backpack.  Pain began at right hip and then worsened into the left paraspinal muscles.  Pain is relieved with rest, heat and ice, IcyHot.  Physical exam was only remarkable for paraspinal tenderness of the lumbar spine and centerline tenderness over L4-L5.  X-ray was initially tried, but was unable to accomplish due to pain.  Patient was then transitioned into having a lumbar CT done.  Which showed no emergent pathology.  Patient was then given Norco and dexamethasone for pain and inflammation.  Patient recommended to have 1 week of low activity, continuing to take Robaxin and naproxen as necessary for pain and muscle spasm relief.  Follow-up with PCP as necessary if pain is needing further management.  Return to ER precautions were given.   Reevaluation:  After the interventions noted above, I reevaluated the patient and found that they have :improved   Social Determinants of Health:  Previous CVA, wife is the main caretaker   Dispostion:  After consideration of the diagnostic results and the patients response to treatment, I feel that the patent would benefit from discharge with treatment noted as above.   Final Clinical Impression(s) / ED Diagnoses Final diagnoses:  Strain of lumbar region, initial encounter    Rx / DC Orders ED Discharge Orders          Ordered    methocarbamol (ROBAXIN) 500 MG tablet  2 times daily        09/18/23 2157    naproxen (NAPROSYN) 375 MG tablet  2 times daily        09/18/23 2157              Lunette Stands, PA-C 09/19/23 0001    Eber Hong, MD 09/19/23 1406

## 2023-09-22 ENCOUNTER — Ambulatory Visit (INDEPENDENT_AMBULATORY_CARE_PROVIDER_SITE_OTHER): Payer: 59

## 2023-09-22 DIAGNOSIS — I639 Cerebral infarction, unspecified: Secondary | ICD-10-CM | POA: Diagnosis not present

## 2023-09-26 LAB — CUP PACEART REMOTE DEVICE CHECK
Date Time Interrogation Session: 20241122180305
Implantable Pulse Generator Implant Date: 20230807
Pulse Gen Serial Number: 511010623

## 2023-10-17 NOTE — Progress Notes (Signed)
[facility] Summary Report / Loop Recorder 

## 2023-10-17 NOTE — Addendum Note (Signed)
Addended by: Elease Etienne A on: 10/17/2023 08:41 AM   Modules accepted: Orders

## 2023-10-24 ENCOUNTER — Ambulatory Visit (INDEPENDENT_AMBULATORY_CARE_PROVIDER_SITE_OTHER): Payer: 59

## 2023-10-24 DIAGNOSIS — I639 Cerebral infarction, unspecified: Secondary | ICD-10-CM

## 2023-10-24 LAB — CUP PACEART REMOTE DEVICE CHECK
Date Time Interrogation Session: 20241222154503
Implantable Pulse Generator Implant Date: 20230807
Pulse Gen Serial Number: 511010623

## 2023-10-31 ENCOUNTER — Other Ambulatory Visit: Payer: Self-pay

## 2023-10-31 ENCOUNTER — Emergency Department (HOSPITAL_COMMUNITY)
Admission: EM | Admit: 2023-10-31 | Discharge: 2023-10-31 | Disposition: A | Discharge status: Home / Self Care | Payer: 59 | Attending: Emergency Medicine | Admitting: Emergency Medicine

## 2023-10-31 ENCOUNTER — Emergency Department (HOSPITAL_COMMUNITY): Payer: 59

## 2023-10-31 DIAGNOSIS — R569 Unspecified convulsions: Secondary | ICD-10-CM | POA: Insufficient documentation

## 2023-10-31 DIAGNOSIS — Z8673 Personal history of transient ischemic attack (TIA), and cerebral infarction without residual deficits: Secondary | ICD-10-CM | POA: Diagnosis not present

## 2023-10-31 LAB — URINALYSIS, ROUTINE W REFLEX MICROSCOPIC
Bilirubin Urine: NEGATIVE
Glucose, UA: NEGATIVE mg/dL
Hgb urine dipstick: NEGATIVE
Ketones, ur: NEGATIVE mg/dL
Leukocytes,Ua: NEGATIVE
Nitrite: NEGATIVE
Protein, ur: NEGATIVE mg/dL
Specific Gravity, Urine: 1.021 (ref 1.005–1.030)
pH: 5 (ref 5.0–8.0)

## 2023-10-31 LAB — COMPREHENSIVE METABOLIC PANEL
ALT: 46 U/L — ABNORMAL HIGH (ref 0–44)
AST: 33 U/L (ref 15–41)
Albumin: 4 g/dL (ref 3.5–5.0)
Alkaline Phosphatase: 72 U/L (ref 38–126)
Anion gap: 10 (ref 5–15)
BUN: 16 mg/dL (ref 8–23)
CO2: 22 mmol/L (ref 22–32)
Calcium: 9.4 mg/dL (ref 8.9–10.3)
Chloride: 103 mmol/L (ref 98–111)
Creatinine, Ser: 1.06 mg/dL (ref 0.61–1.24)
GFR, Estimated: >60 mL/min (ref >60)
Glucose, Bld: 137 mg/dL — ABNORMAL HIGH (ref 70–99)
Potassium: 3.6 mmol/L (ref 3.5–5.1)
Sodium: 135 mmol/L (ref 135–145)
Total Bilirubin: 0.6 mg/dL (ref 0.0–1.2)
Total Protein: 7.1 g/dL (ref 6.5–8.1)

## 2023-10-31 LAB — CBC WITH DIFFERENTIAL/PLATELET
Abs Immature Granulocytes: 0.09 10*3/uL — ABNORMAL HIGH (ref 0.00–0.07)
Basophils Absolute: 0 10*3/uL (ref 0.0–0.1)
Basophils Relative: 0 %
Eosinophils Absolute: 0 10*3/uL (ref 0.0–0.5)
Eosinophils Relative: 0 %
HCT: 43.9 % (ref 39.0–52.0)
Hemoglobin: 15 g/dL (ref 13.0–17.0)
Immature Granulocytes: 1 %
Lymphocytes Relative: 12 %
Lymphs Abs: 1.3 10*3/uL (ref 0.7–4.0)
MCH: 31.8 pg (ref 26.0–34.0)
MCHC: 34.2 g/dL (ref 30.0–36.0)
MCV: 93 fL (ref 80.0–100.0)
Monocytes Absolute: 1.2 10*3/uL — ABNORMAL HIGH (ref 0.1–1.0)
Monocytes Relative: 11 %
Neutro Abs: 8.7 10*3/uL — ABNORMAL HIGH (ref 1.7–7.7)
Neutrophils Relative %: 76 %
Platelets: 139 10*3/uL — ABNORMAL LOW (ref 150–400)
RBC: 4.72 MIL/uL (ref 4.22–5.81)
RDW: 12.3 % (ref 11.5–15.5)
WBC: 11.3 10*3/uL — ABNORMAL HIGH (ref 4.0–10.5)
nRBC: 0 % (ref 0.0–0.2)

## 2023-10-31 LAB — CBG MONITORING, ED: Glucose-Capillary: 115 mg/dL — ABNORMAL HIGH (ref 70–99)

## 2023-10-31 MED ORDER — LEVETIRACETAM IN NACL 1000 MG/100ML IV SOLN
1000.0000 mg | Freq: Once | INTRAVENOUS | Status: AC
  Administered 2023-10-31: 1000 mg via INTRAVENOUS
  Filled 2023-10-31: qty 100

## 2023-10-31 MED ORDER — LEVETIRACETAM 500 MG PO TABS: 500.0000 mg | ORAL_TABLET | Freq: Two times a day (BID) | ORAL | 0 refills | Status: DC

## 2023-10-31 MED ORDER — CYCLOBENZAPRINE HCL 10 MG PO TABS
5.0000 mg | ORAL_TABLET | Freq: Once | ORAL | Status: AC
  Administered 2023-10-31: 5 mg via ORAL
  Filled 2023-10-31: qty 1

## 2023-10-31 MED ORDER — LEVETIRACETAM IN NACL 500 MG/100ML IV SOLN
500.0000 mg | Freq: Once | INTRAVENOUS | Status: AC
  Administered 2023-10-31: 500 mg via INTRAVENOUS
  Filled 2023-10-31: qty 100

## 2023-10-31 MED ORDER — ACETAMINOPHEN 500 MG PO TABS
1000.0000 mg | ORAL_TABLET | Freq: Once | ORAL | Status: AC
Start: 2023-10-31 — End: 2023-10-31
  Administered 2023-10-31: 1000 mg via ORAL
  Filled 2023-10-31: qty 2

## 2023-10-31 NOTE — ED Provider Triage Note (Cosign Needed Addendum)
Emergency Medicine Provider Triage Evaluation Note  Gordon Fisher , a 62 y.o. male  was evaluated in triage.  Pt complains of feeling warm while at a store, had witnessed seizure that lasted for several minutes (hx of seizure 1 year ago when he had COVID). Bit tongue during seizure. No incontinence.  No trauma.   Review of Systems  Positive: seizure Negative: fevers  Physical Exam  BP 94/64 (BP Location: Left Arm)   Pulse (!) 104   Temp 98.2 F (36.8 C) (Oral)   Resp 14   SpO2 96%  Gen:   Awake, no distress   Resp:  Normal effort  MSK:   Moves extremities without difficulty Other:  No neurodeficits, other than aphasia  Medical Decision Making  Medically screening exam initiated at 3:49 PM.  Appropriate orders placed.  [name] was informed that the remainder of the evaluation will be completed by another provider, this initial triage assessment does not replace that evaluation, and the importance of remaining in the ED until their evaluation is complete.    Will need further monitoring, no waiting room given seizure precautions. IV keppra ordered.  [name], PA 10/31/23 1551    [name], [place] 10/31/23 1553

## 2023-10-31 NOTE — Discharge Instructions (Addendum)
We are starting you on a medicine called Keppra to help prevent seizures.  Follow-up with neurology.  Please avoid any activities that could be dangerous including driving, bathing or swimming alone as we discussed.  Do not mix alcohol or drugs.  Please return if symptoms worsen.

## 2023-10-31 NOTE — ED Provider Notes (Addendum)
[place] EMERGENCY DEPARTMENT AT [facility] Provider Note  HPI   Gordon Fisher is a 62 y.o. male patient with a PMHx of stroke in April 2024, who is here today with concern for seizure.  Patient had a stroke in April 2024, has residual right-sided deficits arm and leg, and also some word finding and dysarthria at baseline.  He did have a seizure in October 2024, it was generalized tonic-clonic, resolved on its own, he did have COVID at that time, he was evaluated, but was not put on any antiseizure medication.  He is here today after having a generalized tonic-clonic seizure.  He was waiting in the car for an hour, with the windows down, as he did not want to going shopping with his wife.  When she came back he was complaining that he felt very warm, but was having no other problems.  As she was driving, she noticed that he started having a generalized tonic-clonic seizure that lasted for about a period of a few minutes, and then was postictal for about 10 minutes, he did bite his tongue but no reported urinary or fecal incontinence.  ROS Negative except as per HPI  Medical Decision Making   Upon presentation, the patient is  Afebrile and hemodynamically stable  He has his residual right-sided deficits from his stroke on exam but no other findings except for a small tongue laceration on the left side  For this patient, we will obtain basic labs, urine as well, and EKG was reviewed did not show any major signs of ischemia or arrhythmia he was not have any chest pain before or after this incident, it does seem much more like a seizure given his tongue biting, history, and no preceding symptoms or Po symptoms and also he was postictal.  Will obtain a chest x-ray, and a CT head as well to evaluate for any intracranial process, and ultimately we will reach out to our neurology colleagues, for guidance on if this patient needs antiepileptic medication  We will be giving Keppra as well  1000 mg.  In terms of the patient's workup, his urine is negative, his metabolic panel is largely normal, he has a mild excess of 11.3, and his glucose check was 115 as well.  He is Or tachycardic, he is in the 90s.  His CT head shows chronic atrophy, but nothing acute, chest x-ray showed some low lung volumes,  Spoke to our neurology colleague, requested that we Keppra load him up to 20 mg/kg, we will add an additional 500 mg which will get him right around there.  We will start this patient on Keppra 500 mg twice daily and we will discharge the patient at this time updated both patient and wife at bedside   During taking some Tylenol and Flexeril for some mild back pain that is chronic, but she is having some pain by sitting in the bed here in the hospital.  He did not injure himself during a seizure, as he was sitting in chair the whole time.  At this time, I feel that the patient is medically cleared for discharge and have discussed this with my attending who agrees.  I discussed with the patient and/or family my overall assessment, including my physical exam, labs, imaging, other diagnostic tests, and therapeutics given.  All questions answered and understanding is expressed.  I have instructed to call PCP to establish an outpatient appointment after this ED visit, and necessary specialty follow up if  needed. I gave strict return precautions to come back to the ED including fevers, chills, severe pain, worsening of symptoms, return of symptoms, new and concerning symptoms, inability to tolerate p.o. intake, among others. I specifically stated to return if    1. Seizure (HCC)     @DISPOSITION @  Rx / DC Orders ED Discharge Orders          Ordered    Ambulatory referral to Neurology       Comments: An appointment is requested in approximately: 1 week   10/31/23 1929    levETIRAcetam (KEPPRA) 500 MG tablet  2 times daily        10/31/23 1929             Past Medical History:   Diagnosis Date   Stroke [facility])    Past Surgical History:  Procedure Laterality Date   BUBBLE STUDY  02/15/2022   Procedure: BUBBLE STUDY;  Surgeon: Christell Constant, MD;  [facility]: [facility] END[facility]COPY;  Service: Cardiovascular;;   IR CT HEAD LTD  02/11/2022   IR CT HEAD LTD  02/11/2022   IR CT HEAD LTD  02/11/2022   IR PERCUTANEOUS ART THROMBECTOMY/INFUSION INTRACRANIAL INC DIAG ANGIO  02/11/2022   IR PERCUTANEOUS ART THROMBECTOMY/INFUSION INTRACRANIAL INC DIAG ANGIO  02/11/2022   IR US GUIDE VASC ACCESS RIGHT  02/11/2022   IR US GUIDE VASC ACCESS RIGHT  02/11/2022   RADIOLOGY WITH ANESTHESIA N/A 02/11/2022   Procedure: IR WITH ANESTHESIA;  Surgeon: Radiologist, Medication, MD;  Location: [facility] OR;  Service: Radiology;  Laterality: N/A;   RADIOLOGY WITH ANESTHESIA N/A 02/11/2022   Procedure: RADIOLOGY WITH ANESTHESIA;  Surgeon: Radiologist, Medication, MD;  Location: [facility] OR;  Service: Radiology;  Laterality: N/A;   TEE WITHOUT CARDIOVERSION N/A 02/15/2022   Procedure: TRANSESOPHAGEAL ECHOCARDIOGRAM (TEE);  Surgeon: Christell Constant, MD;  Location: [facility] ENDOSCOPY;  Service: Cardiovascular;  Laterality: N/A;   Family History  Problem Relation Age of Onset   Thyroid disease Mother    Social History   Socioeconomic History   Marital status: Married    Spouse name: [name]   Number of children: 2   Years of education: Not on file   Highest education level: Associate degree: occupational, Scientist, product/process development, or vocational program  Occupational History   Not on file  Tobacco Use   Smoking status: Never    Passive exposure: Never   Smokeless tobacco: Never  Vaping Use   Vaping status: Never Used  Substance and Sexual Activity   Alcohol use: Never   Drug use: Never   Sexual activity: Never  Other Topics Concern   Not on file  Social History Narrative   04/27/22 lives with wife   Social Drivers of Corporate investment banker Strain: Not on file  Food Insecurity: No Food Insecurity (12/10/2022)    Hunger Vital Sign    Worried About Running Out of Food in the Last Year: Never true    Ran Out of Food in the Last Year: Never true  Transportation Needs: No Transportation Needs (12/10/2022)   PRAPARE - Administrator, Civil Service (Medical): No    Lack of Transportation (Non-Medical): No  Physical Activity: Not on file  Stress: Not on file  Social Connections: Not on file  Intimate Partner Violence: Not At Risk (12/10/2022)   Humiliation, Afraid, Rape, and Kick questionnaire    Fear of Current or Ex-Partner: No    Emotionally Abused: No    Physically Abused: No  Sexually Abused: No     Physical Exam   Vitals:   10/31/23 1500 10/31/23 1803  BP: 94/64 (!) 147/84  Pulse: (!) 104 98  Resp: 14 16  Temp: 98.2 F (36.8 C) 99.1 F (37.3 C)  TempSrc: Oral Temporal  SpO2: 96% 96%    Physical Exam Vitals and nursing note reviewed.  Constitutional:      General: He is not in acute distress.    Appearance: Normal appearance. He is well-developed. He is not ill-appearing or toxic-appearing.  HENT:     Head: Normocephalic and atraumatic.     Right Ear: External ear normal.     Left Ear: External ear normal.     Nose: Nose normal.     Mouth/Throat:     Mouth: Mucous membranes are moist.  Eyes:     Extraocular Movements: Extraocular movements intact.     Pupils: Pupils are equal, round, and reactive to light.  Neck:     Comments: No cervical thoracic or lumbar midline tenderness Cardiovascular:     Rate and Rhythm: Normal rate.     Pulses: Normal pulses.  Pulmonary:     Effort: Pulmonary effort is normal. No respiratory distress.     Breath sounds: Normal breath sounds. No stridor. No wheezing, rhonchi or rales.  Abdominal:     Palpations: Abdomen is soft.     Tenderness: There is no abdominal tenderness. There is no right CVA tenderness or left CVA tenderness.  Musculoskeletal:        General: Normal range of motion.     Cervical back: Normal range of motion and  neck supple.     Comments: No bony point tenderness in arms or legs   Skin:    General: Skin is warm and dry.     Capillary Refill: Capillary refill takes less than 2 seconds.  Neurological:     Mental Status: He is alert and oriented to person, place, and time. Mental status is at baseline.     Comments: Patient has very little to no movement of the right arm and right leg, has some mild dysarthria and some mild word finding as well he is alert and oriented  Psychiatric:        Mood and Affect: Mood normal.     Procedures   If procedures were preformed on this patient, they are listed below:  Procedures  The patient was seen, evaluated, and treated in conjunction with the attending physician, who voiced agreement in the care provided.  Note generated using Dragon voice dictation software and may contain dictation errors. Please contact me for any clarification or with any questions.   Electronically signed by:  Osvaldo Shipper, M.D. (PGY-2)    Gunnar Bulla, MD 10/31/23 Graciela Husbands, MD 10/31/23 Lysle Morales, DO 10/31/23 2009

## 2023-10-31 NOTE — ED Provider Notes (Signed)
Supervised resident visit.  Patient here with seizure-like activity.  Maybe the second episode he is had now in the last several months.  History of stroke.  He is back at his baseline.  Witnessed seizure where he had bit his tongue and had postictal phase.  He is now back at his baseline.  He otherwise appears well.  He had basic labs CT scan of the head and chest x-ray done which were unremarkable.  No acute findings on lab work and imaging.  We talked with neurology team and given that this could be his second type seizure activity we will load him with Keppra and start him on Keppra outpatient have him follow-up with neurology outpatient.  Seizure precautions talked with to the family.  They have experience with seizures as one of their children has seizures.  Overall he understands return precautions.  Discharged in good condition.  This chart was dictated using voice recognition software.  Despite best efforts to proofread,  errors can occur which can change the documentation meaning.    Virgina Norfolk, DO 10/31/23 0981

## 2023-10-31 NOTE — ED Triage Notes (Signed)
PT BIB EMS from home witness seizure last approx 3-5 mins. What was sitting in car no injury wife was with patient  Hx of one previous seizure last year. Not on any seizure medication. Previous stroke with right sided paralysis and dysphasia.  Bit tongue during seizure no bowl or bladder incontinence. Pt is at baseline at this time.   20lac  106/62 HR 120 Rr18 97%  CBG 96

## 2023-11-08 ENCOUNTER — Encounter: Payer: 59 | Attending: Physical Medicine & Rehabilitation | Admitting: Physical Medicine & Rehabilitation

## 2023-11-08 ENCOUNTER — Encounter: Payer: Self-pay | Admitting: Diagnostic Neuroimaging

## 2023-11-08 ENCOUNTER — Encounter: Payer: Self-pay | Admitting: Physical Medicine & Rehabilitation

## 2023-11-08 ENCOUNTER — Ambulatory Visit (INDEPENDENT_AMBULATORY_CARE_PROVIDER_SITE_OTHER): Payer: 59 | Admitting: Diagnostic Neuroimaging

## 2023-11-08 VITALS — BP 120/80 | HR 93 | Ht 68.0 in | Wt 191.4 lb

## 2023-11-08 VITALS — BP 132/79 | HR 96 | Ht 68.0 in | Wt 191.0 lb

## 2023-11-08 DIAGNOSIS — R4589 Other symptoms and signs involving emotional state: Secondary | ICD-10-CM | POA: Diagnosis not present

## 2023-11-08 DIAGNOSIS — I63512 Cerebral infarction due to unspecified occlusion or stenosis of left middle cerebral artery: Secondary | ICD-10-CM | POA: Diagnosis present

## 2023-11-08 DIAGNOSIS — M545 Low back pain, unspecified: Secondary | ICD-10-CM | POA: Diagnosis present

## 2023-11-08 DIAGNOSIS — I69398 Other sequelae of cerebral infarction: Secondary | ICD-10-CM | POA: Diagnosis not present

## 2023-11-08 DIAGNOSIS — R569 Unspecified convulsions: Secondary | ICD-10-CM | POA: Diagnosis not present

## 2023-11-08 DIAGNOSIS — G8929 Other chronic pain: Secondary | ICD-10-CM | POA: Diagnosis present

## 2023-11-08 DIAGNOSIS — R52 Pain, unspecified: Secondary | ICD-10-CM | POA: Diagnosis present

## 2023-11-08 MED ORDER — LEVETIRACETAM 500 MG PO TABS: 500.0000 mg | ORAL_TABLET | Freq: Two times a day (BID) | ORAL | 4 refills | Status: AC

## 2023-11-08 NOTE — Progress Notes (Signed)
 [facility]  PATIENT: Gordon Fisher DOB: [birth year]  REFERRING CLINICIAN: Ruthe Cornet, DO HISTORY FROM: patient and wife REASON FOR VISIT: new consult   HISTORICAL  CHIEF COMPLAINT:  Chief Complaint  Patient presents with   Follow-up    Patient in room #7 with his wife. Patient wife states pt has been two seizure this past year and needs to discuss what's going on. Patient states he had a few MRI done and history of stroke.    HISTORY OF PRESENT ILLNESS:   UPDATE (11/08/23, VRP): Since last visit, doing about the same until October 2024 when he had a witnessed generalized convulsive seizure.  Right side of the body seem to get activated and pulled towards the left side of the body.  Symptoms last about 4 to 5 minutes and took about 20 minutes to recover.  Had a second event in December 2024 as well.  Now on antiseizure medication.  Tolerating medication and doing well.  PRIOR HPI (04/27/22, VRP): 63 year old male here for hospital stroke follow-up.  02/11/2022 patient developed right-sided weakness, aphasia, went to hospital for evaluation.  Code stroke was activated.  He received TNK and then transferred to [facility].  He underwent cerebral angiogram with thrombectomy.  Unfortunately vessel reoccluded later the day and the second thrombectomy was attempted.  Left M2 dissection was identified and treated with stent but unfortunately stented demonstrated reocclusion.  Patient was treated with aspirin  and Brilinta .  Stroke work-up was completed.  Patient was transition to inpatient rehabilitation and then eventually back home.  During rehabilitation he was found to have DVT and aspirin  was changed to Eliquis .  Brilinta  was continued.  Blood pressures have been stable.  Statin has been continued.  He has seen cardiology and is planned to have implanted loop recorder in August 2023.  Continues to have significant expressive aphasia although receptive language  is improving.  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   REVIEW OF SYSTEMS: Full 14 system review of systems performed and negative with exception of: As per HPI.  ALLERGIES: No Known Allergies  HOME MEDICATIONS: Outpatient Medications Prior to Visit  Medication Sig Dispense Refill   apixaban  (ELIQUIS ) 5 MG TABS tablet TAKE ONE (1) TABLET BY MOUTH TWO (2) TIMES DAILY (Patient taking differently: Take 2.5 mg by mouth 2 (two) times daily.) 60 tablet 0   atorvastatin  (LIPITOR) 40 MG tablet Take 1 tablet (40 mg total) by mouth every evening. 30 tablet 1   Cholecalciferol (VITAMIN D3) 125 MCG (5000 UT) CAPS Take 1 capsule by mouth daily.     levETIRAcetam  (KEPPRA ) 500 MG tablet Take 1 tablet (500 mg total) by mouth 2 (two) times daily. 60 tablet 0   methocarbamol  (ROBAXIN ) 500 MG tablet Take 1 tablet (500 mg total) by mouth 2 (two) times daily. (Patient not taking: Reported on 11/08/2023) 30 tablet 0   naproxen  (NAPROSYN ) 375 MG tablet Take 1 tablet (375 mg total) by mouth 2 (two) times daily. (Patient not taking: Reported on 11/08/2023) 30 tablet 0   Facility-Administered Medications Prior to Visit  Medication Dose Route Frequency Provider Last Rate Last Admin   diclofenac  Sodium (VOLTAREN ) 1 % topical gel 2 g  2 g Topical QID Urbano Albright, MD        PAST MEDICAL HISTORY: Past Medical History:  Diagnosis Date   Stroke [facility])     PAST SURGICAL HISTORY: Past Surgical History:  Procedure Laterality Date   BUBBLE STUDY  02/15/2022   Procedure: BUBBLE  STUDY;  Surgeon: Santo Stanly LABOR, MD;  Location: [facility] ENDOSCOPY;  Service: Cardiovascular;;   IR CT HEAD LTD  02/11/2022   IR CT HEAD LTD  02/11/2022   IR CT HEAD LTD  02/11/2022   IR PERCUTANEOUS ART THROMBECTOMY/INFUSION INTRACRANIAL INC DIAG ANGIO  02/11/2022   IR PERCUTANEOUS ART THROMBECTOMY/INFUSION INTRACRANIAL INC DIAG ANGIO  02/11/2022   IR US  GUIDE VASC ACCESS RIGHT  02/11/2022   IR US  GUIDE VASC ACCESS RIGHT  02/11/2022   RADIOLOGY  WITH ANESTHESIA N/A 02/11/2022   Procedure: IR WITH ANESTHESIA;  Surgeon: Radiologist, Medication, MD;  Location: [facility] OR;  Service: Radiology;  Laterality: N/A;   RADIOLOGY WITH ANESTHESIA N/A 02/11/2022   Procedure: RADIOLOGY WITH ANESTHESIA;  Surgeon: Radiologist, Medication, MD;  Location: [facility] OR;  Service: Radiology;  Laterality: N/A;   TEE WITHOUT CARDIOVERSION N/A 02/15/2022   Procedure: TRANSESOPHAGEAL ECHOCARDIOGRAM (TEE);  Surgeon: Santo Stanly LABOR, MD;  Location: [facility] ENDOSCOPY;  Service: Cardiovascular;  Laterality: N/A;    FAMILY HISTORY: Family History  Problem Relation Age of Onset   Thyroid disease Mother     SOCIAL HISTORY: Social History   Socioeconomic History   Marital status: Married    Spouse name: [name]   Number of children: 2   Years of education: Not on file   Highest education level: Associate degree: occupational, scientist, product/process development, or vocational program  Occupational History   Not on file  Tobacco Use   Smoking status: Never    Passive exposure: Never   Smokeless tobacco: Never  Vaping Use   Vaping status: Never Used  Substance and Sexual Activity   Alcohol use: Never   Drug use: Never   Sexual activity: Never  Other Topics Concern   Not on file  Social History Narrative   04/27/22 lives with wife   Social Drivers of Corporate Investment Banker Strain: Not on file  Food Insecurity: No Food Insecurity (12/10/2022)   Hunger Vital Sign    Worried About Running Out of Food in the Last Year: Never true    Ran Out of Food in the Last Year: Never true  Transportation Needs: No Transportation Needs (12/10/2022)   PRAPARE - Administrator, Civil Service (Medical): No    Lack of Transportation (Non-Medical): No  Physical Activity: Not on file  Stress: Not on file  Social Connections: Not on file  Intimate Partner Violence: Not At Risk (12/10/2022)   Humiliation, Afraid, Rape, and Kick questionnaire    Fear of Current or Ex-Partner: No     Emotionally Abused: No    Physically Abused: No    Sexually Abused: No     PHYSICAL EXAM  GENERAL EXAM/CONSTITUTIONAL: Vitals:  Vitals:   11/08/23 1008  BP: 132/79  Pulse: 96  Weight: 191 lb (86.6 kg)  Height: 5' 8 (1.727 m)    Body mass index is 29.04 kg/m. Wt Readings from Last 3 Encounters:  11/08/23 191 lb (86.6 kg)  09/18/23 187 lb 6.3 oz (85 kg)  07/14/23 186 lb (84.4 kg)   Patient is in no distress; well developed, nourished and groomed; neck is supple  CARDIOVASCULAR: Examination of carotid arteries is normal; no carotid bruits Regular rate and rhythm, no murmurs Examination of peripheral vascular system by observation and palpation is normal  EYES: Ophthalmoscopic exam of optic discs and posterior segments is normal; no papilledema or hemorrhages No results found.  MUSCULOSKELETAL: Gait, strength, tone, movements noted in Neurologic exam below  NEUROLOGIC: MENTAL STATUS:  No data to display         awake, alert, oriented to person SEVERE EXPRESSIVE APHASIA ABLE TO COMPREHEND ABLE TO NAME PEN, WATCH, GLOVE  CRANIAL NERVE:  2nd, 3rd, 4th, 6th - pupils equal and reactive to light, visual fields full to confrontation, extraocular muscles intact, no nystagmus 5th - facial sensation symmetric 7th - facial strength symmetric 8th - hearing intact 9th - palate elevates symmetrically, uvula midline 11th - shoulder shrug symmetric 12th - tongue protrusion midline  MOTOR:  normal bulk and tone, full strength in the LUE, LLE DENSE RIGHT HEMIPLEGIA --> RUE 1/5, RLE 1/5  SENSORY:  normal and symmetric to light touch, temperature, vibration  COORDINATION:  finger-nose-finger, fine finger movements NORMAL ON LEFT; RUE 0/5  REFLEXES:  deep tendon reflexes TRACE and symmetric  GAIT/STATION:  IN WHEELCHAIR    DIAGNOSTIC DATA (LABS, IMAGING, TESTING) - I reviewed patient records, labs, notes, testing and imaging myself where available.  Lab  Results  Component Value Date   WBC 11.3 (H) 10/31/2023   HGB 15.0 10/31/2023   HCT 43.9 10/31/2023   MCV 93.0 10/31/2023   PLT 139 (L) 10/31/2023      Component Value Date/Time   NA 135 10/31/2023 1621   K 3.6 10/31/2023 1621   CL 103 10/31/2023 1621   CO2 22 10/31/2023 1621   GLUCOSE 137 (H) 10/31/2023 1621   BUN 16 10/31/2023 1621   CREATININE 1.06 10/31/2023 1621   CALCIUM  9.4 10/31/2023 1621   PROT 7.1 10/31/2023 1621   ALBUMIN 4.0 10/31/2023 1621   AST 33 10/31/2023 1621   ALT 46 (H) 10/31/2023 1621   ALKPHOS 72 10/31/2023 1621   BILITOT 0.6 10/31/2023 1621   GFRNONAA >60 10/31/2023 1621   Lab Results  Component Value Date   CHOL 198 02/12/2022   HDL 47 02/12/2022   LDLCALC 122 (H) 02/12/2022   TRIG 146 02/12/2022   CHOLHDL 4.2 02/12/2022   Lab Results  Component Value Date   HGBA1C 6.0 (H) 02/11/2022   Lab Results  Component Value Date   VITAMINB12 238 02/14/2022   Lab Results  Component Value Date   TSH 3.728 02/14/2022   Lipid Panel     Component Value Date/Time   CHOL 198 02/12/2022 0421   TRIG 146 02/12/2022 0421   HDL 47 02/12/2022 0421   CHOLHDL 4.2 02/12/2022 0421   VLDL 29 02/12/2022 0421   LDLCALC 122 (H) 02/12/2022 0421     10/31/23 CT head  1. Generalized cerebral atrophy with chronic left MCA territory encephalomalacia. 2. No acute intracranial abnormality. 3. Moderate severity sphenoid sinus and mild to moderate severity bilateral ethmoid sinus disease.    ASSESSMENT AND PLAN  63 y.o. year old male here with:   Dx:  1. Seizure, late effect of stroke [facility])      PLAN:  POST STROKE SEIZURE (Oct 2024, Dec 2024) - continue levetiracetam  500mg  twice a day   - According to [facility] law, you can not drive unless you are seizure / syncope free for at least 6 months and under physician's care (patient not driving since stroke anyways)  - Please maintain precautions. Do not participate in activities where a loss of awareness  could harm you or someone else. No swimming alone, no tub bathing, no hot tubs, no driving, no operating motorized vehicles (cars, ATVs, motocycles, etc), lawnmowers, power tools or firearms. No standing at heights, such as rooftops, ladders or stairs. Avoid hot objects such as stoves, heaters,  open fires. Wear a helmet when riding a bicycle, scooter, skateboard, etc. and avoid areas of traffic. Set your water heater to 120 degrees or less.    Stroke (April 2023) --> left MCA infarct due to left M1 occlusion s/p TNK and thrombectomy x2 with stent placement for left M2 dissection followed by stent reocclusion, etiology unclear code Stroke CT head Hyperdense left MCA, no ICH ASPECTS 9   CTA head & neck left MCA M1 occlusion MRI  small areas of acute infarct in left MCA territory involving left caudate putamen, insula and left frontal and parietal lobe. Mild HT in left parietal lobe S/p IR x 2 with stent placement for left M2 dissection and then stent reocclusion Repeat CT head 4/14 evolving left MCA distribution infarct, small SAH, stenting of proximal left MCA branches Repeat MRI extensive evolving acute ischemic left MCA territory infarct, small volume SAH at posterior aspect of Sylvian fissure, few small volume acute ischemic cortical and subcortical infarcts in right frontal lobe and ACA distribution 2D Echo EF 60-65%, interatrial septum not well visualized TEE 4/17- negative bubble study, no cardiac thrombus hypercoagulable work up neg except slightly elevated homocysteine  LDL 122 HgbA1c 6.0 No antithrombotic prior to admission, then on aspirin  81 mg daily and Brilinta  (ticagrelor ) 90 mg bid. then aspirin  stopped, eliquis  added for DVT; brillinta continued and now completed   Hypertension Home meds:  none Long-term BP goal normotensive   Hyperlipidemia Home meds:  none LDL 122, goal < 70 continue atorvastatin  40 mg     Other Stroke Risk Factors Obesity, Body mass index is 37.75 kg/m.,  BMI >/= 30 associated with increased stroke risk, recommend weight loss, diet and exercise as appropriate  Consider sleep study; patient and wife would hold off for now  Meds ordered this encounter  Medications   levETIRAcetam  (KEPPRA ) 500 MG tablet    Sig: Take 1 tablet (500 mg total) by mouth 2 (two) times daily.    Dispense:  180 tablet    Refill:  4   Return in about 9 months (around 08/07/2024) for MyChart visit (15 min).    EDUARD FABIENE HANLON, MD 11/08/2023, 10:36 AM Certified in Neurology, Neurophysiology and Neuroimaging  [facility] [address], [address] [place], [place] [address] [phone]

## 2023-11-08 NOTE — Progress Notes (Signed)
 Subjective:    Patient ID: Gordon Fisher, male    DOB: Dec 24, 1960, 63 y.o.   MRN: [id number]  HPI   Brief HPI:   Gordon Fisher is a 63 y.o. male in relatively good health was admitted via [facility] on 02/11/2022 with right-sided weakness, right facial droop and slurred speech.  CT H showed left-MCA hyperdense sign and he reports sleep TNK prior to transfer to [facility].  CTA showed left-MCA M1 occlusion and he underwent cerebral angio with thrombectomy and T1C13 flow by Dr. Edwinna de Rodrigez.  Follow-up MRI brain done revealing small areas of acute infarct in left-MCA territory with mild hemorrhagic transformation in left parietal lobe and left-MCA bifurcation residual vascular thrombus.  He was taken back for thrombectomy with stent for dissection but had intra stent occlusion which was unable to be recanalized.  Postop CT showed small SAH which was stable on follow-up films.   He was started on ASA/Brilinta  as well as tube feeds for nutritional support until MBS of 04/18 when he was advanced to D3 diet with supervision.  Repeat MRI brain 04/16 showed extensive evolving acute ischemic left-MCA occlusion increasing in size with associated edema, few additional small acute cortical/subcortical infarcts involving contralateral right frontal lobe and left ACA distribution.  Stroke felt to be due to unknown etiology and wife elected on Zio patch which was placed prior to discharge to rule out A-fib.  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  CIR was recommended due to functional decline.      Hospital Course: Gordon Fisher was admitted to rehab 02/18/2022 for inpatient therapies to consist of PT, ST and OT at least three hours five days a week. Past admission physiatrist, therapy team and rehab RN have worked together to provide customized collaborative inpatient rehab.  His blood pressures were monitored  on TID basis and has been controlled without medications. He was noted to have hypocalcemia with ionized calcium  at 1.11 therefore Tums was added for calcium  supplementation twice daily.  Check of labs at admission showed AKI which has resolved with serial checks.   He was noted to be febrile on 05/05 and UA done was positive therefore Rocephin  was added for treatment of Enterobacter UTI.SABRA  CXR was NAD. BLE Dopplers also ordered for work-up and showed acute right posterior tib/gastroc DVT.  This was monitored with repeat Dopplers 5/10 which showed persistent DVT and he was cleared to start Eliquis  per discussion with Dr. Florene. ASA was discontinued and he was maintained on Eliquis  and Brilinta  during his stay.  Follow-up CBC shows reactive leukocytosis has resolved and H&H is stable. His blood sugars were initially monitored on ACHS basis due to hyperglycemia from tube feeds.  As intake improved, tube feeds were discontinued and he he is tolerating regular diet without difficulty.     His blood sugars were noted to be improved of tube feeds therefore CBG checks were discontinued and patient and family has been educated on heart healthy carb modified diet.  P.o. intake has been good and he is continent of bowel and bladder.  Right foot x-rays were done due to reports of pain and was negative for fracture.  He continues on Lipitor for elevated LDL.   He has made steady gains during his stay and requires min to mod assist at wheelchair level.  Patient declined extending length of stay and home health therapy is in process of being  set is awaiting responses regarding acceptance from couple more agencies.     Rehab course: During patient's stay in rehab weekly team conferences were held to monitor patient's progress, set goals and discuss barriers to discharge. At admission, patient required mod to total assist with basic ADL task and total assist with mobility.  He exhibited severe expressive and receptive  aphasia with verbal output but limited to yeah and required dysphagia 3 diet with full supervision for safety. He has had improvement in activity tolerance, balance, postural control as well as ability to compensate for deficits. He is able to dress with min to mod assist and requires mod assist for most ADL tasks.  He has had significant improvement in motor planning as well as decrease in right inattention.   He requires min assist for squat pivot transfers versus use of study.  He is able to perform car transfers with heavy min assist.  He requires min to mod assist for standing.  He is able to ambulate 100 feet with +2 mod assist with skilled therapists. He requires min assist for basic comprehension and mod to max assist for communicating via Monday model communication.  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  He is tolerating regular diet without signs or symptoms of aspiration.  Family education was completed with wife and sister-in-law.     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  He is here with his wife.  He is here for for follow-up after his CVA.  Patient has had some increased swelling in his right lower extremity over the past few weeks.  His foot has also had a darker color since this time.  This swelling  color improves with elevation of his leg.  [facility]  He continues to have shoulder pain however this is improved with Tylenol .  His wife reports he is doing well overall and has improved in his communication.  Wife reports he is planned for a loop recorder, she wants to know if patient will be able to use a TENS unit for his right shoulder, e-stim for the foot with therapy when he gets displaced in a few weeks.  Interval History Gordon Fisher is here for follow-up after his left MCA CVA.  His wife is accompanying him today.  They report  he is doing well overall.  He continues working with PT OT and SLP.  He is wearing an AFO for his right foot drop.  They report he has followed by the [facility] physicians.  They report cardiology is planning to keep him on Eliquis  for lifelong treatment.  His shoulder pain continues to be improved.  His leg swelling has also resolved.  He is not having difficulty with swallowing.  Patient does have increased tone in his right upper extremity however he denies this causing him any pain or discomfort.  He also says the increased tone does not interfere with his function in any way.    Interval history 11/08/23 Gordon Fisher is here for follow-up after his prior CVA.  Wife and wife sister recently accompany him today.  Family reports that he was doing very well until about October of this year when he had a seizure.  Seizure occurred sometime after he had COVID.  He also had a repeat seizure about a week ago.  He has been  started on Keppra  by neurology.  Walking with a 4 point cane. Family reports that he he has been gradually losing strength since this time.  He is not working with therapy or doing many exercises since this started in October. Mood has been decreased. He has also been having pain in his lower back and pain throughout the left side of his body.  Patient has had mild lower back pain off and on chronically.    Pain Inventory Average Pain 0 Pain Right Now 0 My pain is  no pain  LOCATION OF PAIN  left side( right side is stroke side) but has had 2 seizures , last one last Monday and may contribute to pain on left side  BOWEL Number of stools per week: 7  BLADDER Normal    Mobility walk with assistance use a cane use a walker ability to climb steps?  yes do you drive?  no use a wheelchair transfers alone Do you have any goals in this area?  yes  Function not employed: date last employed . I need assistance with the following:  feeding, dressing, bathing, meal prep, household  duties, and shopping Do you have any goals in this area?  no  Neuro/Psych No problems in this area  Prior Studies Any changes since last visit?  no  Physicians involved in your care Any changes since last visit?  no   Family History  Problem Relation Age of Onset   Thyroid disease Mother    Social History   Socioeconomic History   Marital status: Married    Spouse name: [name]   Number of children: 2   Years of education: Not on file   Highest education level: Associate degree: occupational, scientist, product/process development, or vocational program  Occupational History   Not on file  Tobacco Use   Smoking status: Never    Passive exposure: Never   Smokeless tobacco: Never  Vaping Use   Vaping status: Never Used  Substance and Sexual Activity   Alcohol use: Never   Drug use: Never   Sexual activity: Never  Other Topics Concern   Not on file  Social History Narrative   04/27/22 lives with wife   Social Drivers of Corporate Investment Banker Strain: Not on file  Food Insecurity: No Food Insecurity (12/10/2022)   Hunger Vital Sign    Worried About Running Out of Food in the Last Year: Never true    Ran Out of Food in the Last Year: Never true  Transportation Needs: No Transportation Needs (12/10/2022)   PRAPARE - Administrator, Civil Service (Medical): No    Lack of Transportation (Non-Medical): No  Physical Activity: Not on file  Stress: Not on file  Social Connections: Not on file   Past Surgical History:  Procedure Laterality Date   BUBBLE STUDY  02/15/2022   Procedure: BUBBLE STUDY;  Surgeon: Santo Stanly LABOR, MD;  Location: [facility];  Service: Cardiovascular;;   IR CT HEAD LTD  02/11/2022   IR CT HEAD LTD  02/11/2022   IR CT HEAD LTD  02/11/2022   IR PERCUTANEOUS ART THROMBECTOMY/INFUSION INTRACRANIAL INC DIAG ANGIO  02/11/2022   IR PERCUTANEOUS ART THROMBECTOMY/INFUSION INTRACRANIAL INC DIAG ANGIO  02/11/2022   IR US  GUIDE VASC ACCESS RIGHT  02/11/2022   IR US   GUIDE VASC ACCESS RIGHT  02/11/2022   RADIOLOGY WITH ANESTHESIA N/A 02/11/2022   Procedure: IR WITH ANESTHESIA;  Surgeon: Radiologist, Medication, MD;  Location: [facility] OR;  Service:  Radiology;  Laterality: N/A;   RADIOLOGY WITH ANESTHESIA N/A 02/11/2022   Procedure: RADIOLOGY WITH ANESTHESIA;  Surgeon: Radiologist, Medication, MD;  Location: [facility] OR;  Service: Radiology;  Laterality: N/A;   TEE WITHOUT CARDIOVERSION N/A 02/15/2022   Procedure: TRANSESOPHAGEAL ECHOCARDIOGRAM (TEE);  Surgeon: Santo Stanly LABOR, MD;  Location: [facility] END[facility];  Service: Cardiovascular;  Laterality: N/A;   Past Medical History:  Diagnosis Date   Stroke (HCC)    BP 120/80   Pulse 93   Ht 5' 8 (1.727 m)   Wt 191 lb 6.4 oz (86.8 kg)   SpO2 97%   BMI 29.10 kg/m   Opioid Risk Score:   Fall Risk Score:  `1  Depression screen [facility] 2/9     11/08/2023   11:27 AM 12/10/2022   10:07 AM 11/08/2022   11:22 AM 08/06/2022   11:49 AM 05/06/2022    1:07 PM 04/01/2022    1:38 PM  Depression screen PHQ 2/9  Decreased Interest 0 0 0 0 0 0  Down, Depressed, Hopeless 0 0 0 0 0 0  PHQ - 2 Score 0 0 0 0 0 0  Altered sleeping      0  Tired, decreased energy      0  Change in appetite      0  Feeling bad or failure about yourself       0  Trouble concentrating      0  Moving slowly or fidgety/restless      0  Suicidal thoughts      0  PHQ-9 Score      0      Review of Systems  All other systems reviewed and are negative.      Objective:   Physical Exam   Gen: no distress, normal appearing HEENT: oral mucosa pink and moist, NCAT Cardio: Reg rate Chest: normal effort, normal rate of breathing Abd: soft, non-distended Ext: RLE with mild edema, darker skin, peripheral pulses intact Psych: Affect a little flat Skin: intact Neuro: Aphasia primarily expressive. Follows commands.  Musculoskeletal: Strength 5 out of 5 left lower and left upper extremity Strength RUE 1/5 shoulder abduction and elbow flexion, 2 out of 5  elbow extension and finger flexion Strength 1-2 out of 5 right hip flexion and knee extension Increased tone in RUE MAS 2 finger flexion, MAS 2 elbow flexion and extension, shoulder abduction 1 Walking with 4 point cane AFO RLE No significant tenderness noted to palpation throughout his left arm and leg.  No TTP L-spine No significant pain was noted with PROM of his left arm or leg Facet loading negative SLR negative bilaterally  L spine Xray 11/07/22  FINDINGS: There is no evidence of lumbar spine fracture. Alignment is normal. Intervertebral disc spaces are maintained.   IMPRESSION: Negative.      Assessment & Plan:    Left MCA CVA -Consider restart therapy.  Patient is not agreeable at this time -Continue AFO for R foot drop -Discussed increased tone in RUE, currently not causing pain or interfering with function, continue regular stretching exercises, continue to monitor -I do not think he would qualify for vivistim at this time    2. R shoulder pain, suspect due to subluxation  -Continue occasional use of sling to prevent injury -Reports pain has improved    3. DVT right gastroc/peroneal -Pt reports his cardiologist continuing apixaban   indefinitely -Swelling has resolved   4.  Chronic lower back pain  -Unclear cause.  X-ray November 2024  without significant findings.  Possibly related to MSK injury from his seizures?  -Tylenol  as needed.  Voltaren  gel as needed  -Discussed foods for pain.  Discussed trying turmeric  -Patient and family not interested in any medications at this time.  Would like to try more natural options first.  -Could consider further imaging if this worsens.  5.  Left-sided pain including left arm and leg  -Treatment as above -please see chronic lower back pain  -Could consider gabapentin at a later time.  6.  Seizures  -Currently on Keppra .  Continue follow-up with neurology  7.  Depressed mood  -Consider SSRI or SNRI.  Cymbalta could be  helpful for his pain as well.  Patient family would like to hold off on this for now.

## 2023-11-24 ENCOUNTER — Ambulatory Visit (INDEPENDENT_AMBULATORY_CARE_PROVIDER_SITE_OTHER): Payer: 59

## 2023-11-24 DIAGNOSIS — I639 Cerebral infarction, unspecified: Secondary | ICD-10-CM

## 2023-11-28 LAB — CUP PACEART REMOTE DEVICE CHECK
Date Time Interrogation Session: 20250126200014
Implantable Pulse Generator Implant Date: 20230807
Pulse Gen Serial Number: 511010623

## 2023-11-29 ENCOUNTER — Encounter: Payer: Self-pay | Admitting: Cardiology

## 2023-11-29 NOTE — Progress Notes (Signed)
[facility] Loop Recorder

## 2023-12-26 ENCOUNTER — Ambulatory Visit (INDEPENDENT_AMBULATORY_CARE_PROVIDER_SITE_OTHER): Payer: 59

## 2023-12-26 DIAGNOSIS — I639 Cerebral infarction, unspecified: Secondary | ICD-10-CM | POA: Diagnosis not present

## 2023-12-28 LAB — CUP PACEART REMOTE DEVICE CHECK
Date Time Interrogation Session: 20250224150026
Implantable Pulse Generator Implant Date: 20230807
Pulse Gen Serial Number: 511010623

## 2024-01-03 ENCOUNTER — Encounter: Payer: Self-pay | Admitting: Cardiology

## 2024-01-04 NOTE — Addendum Note (Signed)
 Addended by: Elease Etienne A on: 01/04/2024 12:51 PM   Modules accepted: Orders

## 2024-01-04 NOTE — Progress Notes (Signed)
 [facility] Summary Report / Loop Recorder

## 2024-01-16 ENCOUNTER — Encounter: Payer: Self-pay | Admitting: Physical Medicine & Rehabilitation

## 2024-01-16 ENCOUNTER — Encounter: Payer: 59 | Attending: Physical Medicine & Rehabilitation | Admitting: Physical Medicine & Rehabilitation

## 2024-01-16 VITALS — BP 116/77 | HR 75 | Ht 68.0 in | Wt 197.4 lb

## 2024-01-16 DIAGNOSIS — R52 Pain, unspecified: Secondary | ICD-10-CM | POA: Diagnosis present

## 2024-01-16 DIAGNOSIS — I63512 Cerebral infarction due to unspecified occlusion or stenosis of left middle cerebral artery: Secondary | ICD-10-CM | POA: Insufficient documentation

## 2024-01-16 DIAGNOSIS — G8929 Other chronic pain: Secondary | ICD-10-CM | POA: Insufficient documentation

## 2024-01-16 DIAGNOSIS — R4589 Other symptoms and signs involving emotional state: Secondary | ICD-10-CM | POA: Diagnosis not present

## 2024-01-16 DIAGNOSIS — M545 Low back pain, unspecified: Secondary | ICD-10-CM | POA: Diagnosis not present

## 2024-01-16 DIAGNOSIS — R569 Unspecified convulsions: Secondary | ICD-10-CM | POA: Insufficient documentation

## 2024-01-16 NOTE — Progress Notes (Signed)
 Subjective:    Patient ID: Gordon Fisher, male    DOB: [birth year], 63 y.o.   MRN: [id number]  HPI   Brief HPI:   Gordon Fisher is a 63 y.o. male in relatively good health was admitted via [facility] on 02/11/2022 with right-sided weakness, right facial droop and slurred speech.  CT H showed left-MCA hyperdense sign and he reports sleep TNK prior to transfer to [facility].  CTA showed left-MCA M1 occlusion and he underwent cerebral angio with thrombectomy and T1C13 flow by Dr. Lonzo Candy de Rodrigez.  Follow-up MRI brain done revealing small areas of acute infarct in left-MCA territory with mild hemorrhagic transformation in left parietal lobe and left-MCA bifurcation residual vascular thrombus.  He was taken back for thrombectomy with stent for dissection but had intra stent occlusion which was unable to be recanalized.  Postop CT showed small SAH which was stable on follow-up films.   He was started on ASA/Brilinta as well as tube feeds for nutritional support until MBS of 04/18 when he was advanced to D3 diet with supervision.  Repeat MRI brain 04/16 showed extensive evolving acute ischemic left-MCA occlusion increasing in size with associated edema, few additional small acute cortical/subcortical infarcts involving contralateral right frontal lobe and left ACA distribution.  Stroke felt to be due to unknown etiology and wife elected on Zio patch which was placed prior to discharge to rule out A-fib.  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  CIR was recommended due to functional decline.      Hospital Course: Gordon Fisher was admitted to rehab 02/18/2022 for inpatient therapies to consist of PT, ST and OT at least three hours five days a week. Past admission physiatrist, therapy team and rehab RN have worked together to provide customized collaborative inpatient rehab.  His blood pressures were monitored  on TID basis and has been controlled without medications. He was noted to have hypocalcemia with ionized calcium at 1.11 therefore Tums was added for calcium supplementation twice daily.  Check of labs at admission showed AKI which has resolved with serial checks.   He was noted to be febrile on 05/05 and UA done was positive therefore Rocephin was added for treatment of Enterobacter UTI.[name]  CXR was NAD. BLE Dopplers also ordered for work-up and showed acute right posterior tib/gastroc DVT.  This was monitored with repeat Dopplers 5/10 which showed persistent DVT and he was cleared to start Eliquis per discussion with Dr. Xu/neurology. ASA was discontinued and he was maintained on Eliquis and Brilinta during his stay.  Follow-up CBC shows reactive leukocytosis has resolved and H&H is stable. His blood sugars were initially monitored on ACHS basis due to hyperglycemia from tube feeds.  As intake improved, tube feeds were discontinued and he he is tolerating regular diet without difficulty.     His blood sugars were noted to be improved of tube feeds therefore CBG checks were discontinued and patient and family has been educated on heart healthy carb modified diet.  P.o. intake has been good and he is continent of bowel and bladder.  Right foot x-rays were done due to reports of pain and was negative for fracture.  He continues on Lipitor for elevated LDL.   He has made steady gains during his stay and requires min to mod assist at wheelchair level.  Patient declined extending length of stay and home health therapy is in process of being  set is awaiting responses regarding acceptance from couple more agencies.     Rehab course: During patient's stay in rehab weekly team conferences were held to monitor patient's progress, set goals and discuss barriers to discharge. At admission, patient required mod to total assist with basic ADL task and total assist with mobility.  He exhibited severe expressive and receptive  aphasia with verbal output but limited to yeah and required dysphagia 3 diet with full supervision for safety. He has had improvement in activity tolerance, balance, postural control as well as ability to compensate for deficits. He is able to dress with min to mod assist and requires mod assist for most ADL tasks.  He has had significant improvement in motor planning as well as decrease in right inattention.   He requires min assist for squat pivot transfers versus use of study.  He is able to perform car transfers with heavy min assist.  He requires min to mod assist for standing.  He is able to ambulate 100 feet with +2 mod assist with skilled therapists. He requires min assist for basic comprehension and mod to max assist for communicating via Monday model communication.  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  He is tolerating regular diet without signs or symptoms of aspiration.  Family education was completed with wife and sister-in-law.     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  He is here with his wife.  He is here for for follow-up after his CVA.  Patient has had some increased swelling in his right lower extremity over the past few weeks.  His foot has also had a darker color since this time.  This swelling  color improves with elevation of his leg.  [facility]  He continues to have shoulder pain however this is improved with Tylenol.  His wife reports he is doing well overall and has improved in his communication.  Wife reports he is planned for a loop recorder, she wants to know if patient will be able to use a TENS unit for his right shoulder, e-stim for the foot with therapy when he gets displaced in a few weeks.  Interval History Gordon Fisher is here for follow-up after his left MCA CVA.  His wife is accompanying him today.  They report  he is doing well overall.  He continues working with PT OT and SLP.  He is wearing an AFO for his right foot drop.  They report he has followed by the [facility] physicians.  They report cardiology is planning to keep him on Eliquis for lifelong treatment.  His shoulder pain continues to be improved.  His leg swelling has also resolved.  He is not having difficulty with swallowing.  Patient does have increased tone in his right upper extremity however he denies this causing him any pain or discomfort.  He also says the increased tone does not interfere with his function in any way.    Interval history 11/08/23 Gordon Fisher is here for follow-up after his prior CVA.  Wife and wife sister recently accompany him today.  Family reports that he was doing very well until about October of this year when he had a seizure.  Seizure occurred sometime after he had COVID.  He also had a repeat seizure about a week ago.  He has been  started on Keppra by neurology.  Walking with a 4 point cane. Family reports that he he has been gradually losing strength since this time.  He is not working with therapy or doing many exercises since this started in October. Mood has been decreased. He has also been having pain in his lower back and pain throughout the left (addendum right) side of his body.  Patient has had mild lower back pain off and on chronically.    Interval history 01/16/24 Gordon Fisher is here with his wife for follow-up after his prior CVA.  He reports he is doing better from last visit.  He continues to be fatigued due to antiepileptic medications he is taking however this has improved significantly.  He is walking around and being a little more active than previously.  He still not interested in doing any work with PT at this time.  Mood is doing better as well.  Continues to have occasional back pain and pain in his right arm and leg however this is also much improved from last visit.  Pain Inventory Average Pain 0 Pain  Right Now 0 My pain is  no pain  LOCATION OF PAIN  left side( right side is stroke side) but has had 2 seizures , last one last Monday and may contribute to pain on left side  NO NEW SEIZURES  BOWEL Number of stools per week: 7  BLADDER Normal    Mobility walk with assistance use a cane use a walker ability to climb steps?  yes do you drive?  no use a wheelchair transfers alone Do you have any goals in this area?  yes  Function not employed: date last employed . I need assistance with the following:  feeding, dressing, bathing, meal prep, household duties, and shopping Do you have any goals in this area?  no  Neuro/Psych No problems in this area  Prior Studies Any changes since last visit?  no  Physicians involved in your care Any changes since last visit?  no   Family History  Problem Relation Age of Onset   Thyroid disease Mother    Social History   Socioeconomic History   Marital status: Married    Spouse name: [name]   Number of children: 2   Years of education: Not on file   Highest education level: Associate degree: occupational, Scientist, product/process development, or vocational program  Occupational History   Not on file  Tobacco Use   Smoking status: Never    Passive exposure: Never   Smokeless tobacco: Never  Vaping Use   Vaping status: Never Used  Substance and Sexual Activity   Alcohol use: Never   Drug use: Never   Sexual activity: Never  Other Topics Concern   Not on file  Social History Narrative   04/27/22 lives with wife   Social Drivers of Corporate investment banker Strain: Not on file  Food Insecurity: No Food Insecurity (12/10/2022)   Hunger Vital Sign    Worried About Running Out of Food in the Last Year: Never true    Ran Out of Food in the Last Year: Never true  Transportation Needs: No Transportation Needs (12/10/2022)   PRAPARE - Administrator, Civil Service (Medical): No    Lack of Transportation (Non-Medical): No  Physical  Activity: Not on file  Stress: Not on file  Social Connections: Not on file   Past Surgical History:  Procedure Laterality Date   BUBBLE STUDY  02/15/2022  Procedure: BUBBLE STUDY;  Surgeon: Christell Constant, MD;  Location: [facility] ENDOSCOPY;  Service: Cardiovascular;;   IR CT HEAD LTD  02/11/2022   IR CT HEAD LTD  02/11/2022   IR CT HEAD LTD  02/11/2022   IR PERCUTANEOUS ART THROMBECTOMY/INFUSION INTRACRANIAL INC DIAG ANGIO  02/11/2022   IR PERCUTANEOUS ART THROMBECTOMY/INFUSION INTRACRANIAL INC DIAG ANGIO  02/11/2022   IR US GUIDE VASC ACCESS RIGHT  02/11/2022   IR US GUIDE VASC ACCESS RIGHT  02/11/2022   RADIOLOGY WITH ANESTHESIA N/A 02/11/2022   Procedure: IR WITH ANESTHESIA;  Surgeon: Radiologist, Medication, MD;  Location: [facility] OR;  Service: Radiology;  Laterality: N/A;   RADIOLOGY WITH ANESTHESIA N/A 02/11/2022   Procedure: RADIOLOGY WITH ANESTHESIA;  Surgeon: Radiologist, Medication, MD;  Location: [facility] OR;  Service: Radiology;  Laterality: N/A;   TEE WITHOUT CARDIOVERSION N/A 02/15/2022   Procedure: TRANSESOPHAGEAL ECHOCARDIOGRAM (TEE);  Surgeon: Christell Constant, MD;  Location: [facility] ENDOSCOPY;  Service: Cardiovascular;  Laterality: N/A;   Past Medical History:  Diagnosis Date   Stroke (HCC)    BP 116/77   Pulse 75   Ht 5\' 8"  (1.727 m)   Wt 197 lb 6.4 oz (89.5 kg)   SpO2 97%   BMI 30.01 kg/m   Opioid Risk Score:   Fall Risk Score:  `1  Depression screen [facility] 2/9     01/16/2024   11:55 AM 11/08/2023   11:27 AM 12/10/2022   10:07 AM 11/08/2022   11:22 AM 08/06/2022   11:49 AM 05/06/2022    1:07 PM 04/01/2022    1:38 PM  Depression screen PHQ 2/9  Decreased Interest 0 0 0 0 0 0 0  Down, Depressed, Hopeless 0 0 0 0 0 0 0  PHQ - 2 Score 0 0 0 0 0 0 0  Altered sleeping       0  Tired, decreased energy       0  Change in appetite       0  Feeling bad or failure about yourself        0  Trouble concentrating       0  Moving slowly or fidgety/restless       0  Suicidal thoughts        0  PHQ-9 Score       0      Review of Systems  All other systems reviewed and are negative.      Objective:   Physical Exam   Gen: no distress, normal appearing HEENT: oral mucosa pink and moist, NCAT Cardio: Reg rate Chest: normal effort, normal rate of breathing Abd: soft, non-distended Ext: RLE with mild edema, darker skin, peripheral pulses intact Psych: Cooperative, appropriate Skin: intact Neuro: Aphasia primarily expressive. Follows commands.  Musculoskeletal: Strength 5 out of 5 left lower and left upper extremity Strength RUE 1/5 shoulder abduction and elbow flexion, 2 out of 5 elbow extension and finger flexion Strength 1-2 out of 5 right hip flexion and 4-/5 knee extension Increased tone in RUE MAS 2 finger flexion, MAS 2 elbow flexion and extension, shoulder abduction 1 Walking with 4 point cane AFO RLE in place No significant tenderness noted to palpation throughout his left arm and leg.  No TTP L-spine No significant pain was noted with PROM of his left arm or leg   L spine Xray 11/07/22  FINDINGS: There is no evidence of lumbar spine fracture. Alignment is normal. Intervertebral disc spaces are maintained.   IMPRESSION: Negative.  Assessment & Plan:    Left MCA CVA -Patient declines restart of PT today.  He plans to try walking and increasing activity level on his own. -Continue AFO for R foot drop -Discussed increased tone in RUE, currently not causing pain or interfering with function, continue regular stretching exercises, continue to monitor -I do not think he would qualify for vivistim at this time    2. R shoulder pain, suspect due to subluxation  -Continue occasional use of sling to prevent injury -Reports pain has improved  3. DVT right gastroc/peroneal -Pt reports his cardiologist continuing apixaban  indefinitely  4.  Chronic lower back pain-improved  -Unclear cause.  X-ray November 2024 without significant findings.   Possibly related to MSK injury from his seizures?  -Tylenol as needed.  Voltaren gel as needed  -Discussed foods for pain prior visit  -Patient and family not interested in any medications at this time.   -Could consider further imaging if this worsens.  5.  Right-sided pain including arm and leg  -Patient says he has TENS unit-continue use as this provides benefit  -Pain is improved, he is not interested in any additional changes at this time  6.  Seizures  -Currently on Keppra.  Continue follow-up with neurology  7.  Depressed mood  -Consider SSRI or SNRI.  Cymbalta could be helpful for his pain as well.  Patient reports his mood is improving and he is not interested in any medications at this time.

## 2024-01-26 ENCOUNTER — Ambulatory Visit (INDEPENDENT_AMBULATORY_CARE_PROVIDER_SITE_OTHER): Payer: 59

## 2024-01-26 DIAGNOSIS — I639 Cerebral infarction, unspecified: Secondary | ICD-10-CM

## 2024-01-26 LAB — CUP PACEART REMOTE DEVICE CHECK
Date Time Interrogation Session: 20250327130030
Implantable Pulse Generator Implant Date: 20230807
Pulse Gen Model: 5000
Pulse Gen Serial Number: 511010623

## 2024-01-28 ENCOUNTER — Encounter: Payer: Self-pay | Admitting: Cardiology

## 2024-01-30 NOTE — Progress Notes (Signed)
 [employer] [address] [employer]

## 2024-01-30 NOTE — Addendum Note (Signed)
 Addended by: Geralyn Flash D on: 01/30/2024 04:49 PM   Modules accepted: Orders

## 2024-02-27 ENCOUNTER — Ambulatory Visit (INDEPENDENT_AMBULATORY_CARE_PROVIDER_SITE_OTHER): Payer: 59

## 2024-02-27 ENCOUNTER — Encounter: Payer: Self-pay | Admitting: Cardiology

## 2024-02-27 DIAGNOSIS — I639 Cerebral infarction, unspecified: Secondary | ICD-10-CM

## 2024-02-27 LAB — CUP PACEART REMOTE DEVICE CHECK
Date Time Interrogation Session: 20250428072209
Implantable Pulse Generator Implant Date: 20230807
Pulse Gen Model: 5000
Pulse Gen Serial Number: 511010623

## 2024-03-07 NOTE — Progress Notes (Signed)
 [facility] Summary Report / Loop Recorder

## 2024-03-29 ENCOUNTER — Ambulatory Visit (INDEPENDENT_AMBULATORY_CARE_PROVIDER_SITE_OTHER): Payer: 59

## 2024-03-29 DIAGNOSIS — I639 Cerebral infarction, unspecified: Secondary | ICD-10-CM | POA: Diagnosis not present

## 2024-03-29 LAB — CUP PACEART REMOTE DEVICE CHECK
Date Time Interrogation Session: 20250529110015
Implantable Pulse Generator Implant Date: 20230807
Pulse Gen Model: 5000
Pulse Gen Serial Number: 511010623

## 2024-04-02 ENCOUNTER — Ambulatory Visit: Payer: Self-pay | Admitting: Cardiology

## 2024-04-12 NOTE — Progress Notes (Signed)
 [employer] [address] [employer]

## 2024-04-30 ENCOUNTER — Ambulatory Visit (INDEPENDENT_AMBULATORY_CARE_PROVIDER_SITE_OTHER): Payer: 59

## 2024-04-30 DIAGNOSIS — I639 Cerebral infarction, unspecified: Secondary | ICD-10-CM

## 2024-05-01 LAB — CUP PACEART REMOTE DEVICE CHECK
Date Time Interrogation Session: 20250630110033
Implantable Pulse Generator Implant Date: 20230807
Pulse Gen Model: 5000
Pulse Gen Serial Number: 511010623

## 2024-05-02 ENCOUNTER — Ambulatory Visit: Payer: Self-pay | Admitting: Cardiology

## 2024-05-17 NOTE — Progress Notes (Signed)
 [facility] Summary Report / Loop Recorder

## 2024-05-30 NOTE — Addendum Note (Signed)
 Addended by: TAWNI DRILLING D on: 05/30/2024 05:29 PM   Modules accepted: Orders

## 2024-05-30 NOTE — Progress Notes (Signed)
 [employer] [address] [employer]

## 2024-05-31 ENCOUNTER — Ambulatory Visit: Payer: 59

## 2024-05-31 DIAGNOSIS — I639 Cerebral infarction, unspecified: Secondary | ICD-10-CM | POA: Diagnosis not present

## 2024-06-01 LAB — CUP PACEART REMOTE DEVICE CHECK
Date Time Interrogation Session: 20250731190122
Implantable Pulse Generator Implant Date: 20230807
Pulse Gen Model: 5000
Pulse Gen Serial Number: 511010623

## 2024-06-04 ENCOUNTER — Ambulatory Visit: Payer: Self-pay | Admitting: Cardiology

## 2024-07-03 ENCOUNTER — Ambulatory Visit (INDEPENDENT_AMBULATORY_CARE_PROVIDER_SITE_OTHER)

## 2024-07-03 DIAGNOSIS — I63512 Cerebral infarction due to unspecified occlusion or stenosis of left middle cerebral artery: Secondary | ICD-10-CM | POA: Diagnosis not present

## 2024-07-04 ENCOUNTER — Other Ambulatory Visit: Payer: Self-pay

## 2024-07-04 DIAGNOSIS — I824Z1 Acute embolism and thrombosis of unspecified deep veins of right distal lower extremity: Secondary | ICD-10-CM

## 2024-07-04 DIAGNOSIS — I63512 Cerebral infarction due to unspecified occlusion or stenosis of left middle cerebral artery: Secondary | ICD-10-CM

## 2024-07-05 ENCOUNTER — Inpatient Hospital Stay: Payer: 59

## 2024-07-05 LAB — CUP PACEART REMOTE DEVICE CHECK
Date Time Interrogation Session: 20250831090037
Implantable Pulse Generator Implant Date: 20230807
Pulse Gen Model: 5000
Pulse Gen Serial Number: 511010623

## 2024-07-07 ENCOUNTER — Ambulatory Visit: Payer: Self-pay | Admitting: Cardiology

## 2024-07-10 NOTE — Progress Notes (Signed)
 Remote Loop Recorder Transmission

## 2024-07-12 ENCOUNTER — Inpatient Hospital Stay: Payer: 59 | Admitting: Oncology

## 2024-07-30 NOTE — Progress Notes (Signed)
 Remote Loop Recorder Transmission

## 2024-08-02 ENCOUNTER — Ambulatory Visit (INDEPENDENT_AMBULATORY_CARE_PROVIDER_SITE_OTHER): Payer: 59

## 2024-08-02 DIAGNOSIS — I493 Ventricular premature depolarization: Secondary | ICD-10-CM | POA: Diagnosis not present

## 2024-08-02 LAB — CUP PACEART REMOTE DEVICE CHECK
Date Time Interrogation Session: 20251002080041
Implantable Pulse Generator Implant Date: 20230807
Pulse Gen Model: 5000
Pulse Gen Serial Number: 511010623

## 2024-08-03 ENCOUNTER — Ambulatory Visit: Payer: Self-pay | Admitting: Cardiology

## 2024-08-06 NOTE — Progress Notes (Signed)
 Remote Loop Recorder Transmission

## 2024-08-13 ENCOUNTER — Telehealth: Payer: 59 | Admitting: Diagnostic Neuroimaging

## 2024-08-13 ENCOUNTER — Encounter: Payer: Self-pay | Admitting: Diagnostic Neuroimaging

## 2024-08-13 DIAGNOSIS — R569 Unspecified convulsions: Secondary | ICD-10-CM | POA: Diagnosis not present

## 2024-08-13 DIAGNOSIS — I69398 Other sequelae of cerebral infarction: Secondary | ICD-10-CM | POA: Diagnosis not present

## 2024-08-13 NOTE — Patient Instructions (Signed)
-   continue levetiracetam 500mg twice a day 

## 2024-08-13 NOTE — Progress Notes (Signed)
 [facility]  PATIENT: Gordon Fisher DOB: 04/26/1961  REFERRING CLINICIAN: Wood-Cummings, Terri A,* HISTORY FROM: patient and wife REASON FOR VISIT: follow up   HISTORICAL  CHIEF COMPLAINT:  Chief Complaint  Patient presents with   Seizures         HISTORY OF PRESENT ILLNESS:   UPDATE (08/13/24, VRP): Since last visit, doing well. Symptoms are stable. No more seizures. Some ongoing fatigue.   UPDATE (11/08/23, VRP): Since last visit, doing about the same until October 2024 when he had a witnessed generalized convulsive seizure.  Right side of the body seem to get activated and pulled towards the left side of the body.  Symptoms last about 4 to 5 minutes and took about 20 minutes to recover.  Had a second event in December 2024 as well.  Now on antiseizure medication.  Tolerating medication and doing well.  PRIOR HPI (04/27/22, VRP): 63 year old male here for hospital stroke follow-up.  02/11/2022 patient developed right-sided weakness, aphasia, went to hospital for evaluation.  Code stroke was activated.  He received TNK and then transferred to [facility].  He underwent cerebral angiogram with thrombectomy.  Unfortunately vessel reoccluded later the day and the second thrombectomy was attempted.  Left M2 dissection was identified and treated with stent but unfortunately stented demonstrated reocclusion.  Patient was treated with aspirin  and Brilinta .  Stroke work-up was completed.  Patient was transition to inpatient rehabilitation and then eventually back home.  During rehabilitation he was found to have DVT and aspirin  was changed to Eliquis .  Brilinta  was continued.  Blood pressures have been stable.  Statin has been continued.  He has seen cardiology and is planned to have implanted loop recorder in August 2023.  Continues to have significant expressive aphasia although receptive language is improving.  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   REVIEW OF  SYSTEMS: Full 14 system review of systems performed and negative with exception of: As per HPI.  ALLERGIES: No Known Allergies  HOME MEDICATIONS: Outpatient Medications Prior to Visit  Medication Sig Dispense Refill   apixaban  (ELIQUIS ) 5 MG TABS tablet TAKE ONE (1) TABLET BY MOUTH TWO (2) TIMES DAILY (Patient taking differently: Take 2.5 mg by mouth 2 (two) times daily.) 60 tablet 0   atorvastatin  (LIPITOR) 40 MG tablet Take 1 tablet (40 mg total) by mouth every evening. 30 tablet 1   levETIRAcetam  (KEPPRA ) 500 MG tablet Take 1 tablet (500 mg total) by mouth 2 (two) times daily. 180 tablet 4   Facility-Administered Medications Prior to Visit  Medication Dose Route Frequency Provider Last Rate Last Admin   diclofenac  Sodium (VOLTAREN ) 1 % topical gel 2 g  2 g Topical QID Urbano Albright, MD        PAST MEDICAL HISTORY: Past Medical History:  Diagnosis Date   Stroke [facility])     PAST SURGICAL HISTORY: Past Surgical History:  Procedure Laterality Date   BUBBLE STUDY  02/15/2022   Procedure: BUBBLE STUDY;  Surgeon: Santo Stanly DELENA, MD;  Location: [facility] ENDOSCOPY;  Service: Cardiovascular;;   IR CT HEAD LTD  02/11/2022   IR CT HEAD LTD  02/11/2022   IR CT HEAD LTD  02/11/2022   IR PERCUTANEOUS ART THROMBECTOMY/INFUSION INTRACRANIAL INC DIAG ANGIO  02/11/2022   IR PERCUTANEOUS ART THROMBECTOMY/INFUSION INTRACRANIAL INC DIAG ANGIO  02/11/2022   IR US  GUIDE VASC ACCESS RIGHT  02/11/2022   IR US  GUIDE VASC ACCESS RIGHT  02/11/2022   RADIOLOGY WITH ANESTHESIA N/A 02/11/2022  Procedure: IR WITH ANESTHESIA;  Surgeon: Radiologist, Medication, MD;  Location: [facility] OR;  Service: Radiology;  Laterality: N/A;   RADIOLOGY WITH ANESTHESIA N/A 02/11/2022   Procedure: RADIOLOGY WITH ANESTHESIA;  Surgeon: Radiologist, Medication, MD;  Location: [facility] OR;  Service: Radiology;  Laterality: N/A;   TEE WITHOUT CARDIOVERSION N/A 02/15/2022   Procedure: TRANSESOPHAGEAL ECHOCARDIOGRAM (TEE);  Surgeon: Santo Stanly LABOR, MD;  Location: [facility] ENDOSCOPY;  Service: Cardiovascular;  Laterality: N/A;    FAMILY HISTORY: Family History  Problem Relation Age of Onset   Thyroid disease Mother     SOCIAL HISTORY: Social History   Socioeconomic History   Marital status: Married    Spouse name: Gordon Fisher   Number of children: 2   Years of education: Not on file   Highest education level: Associate degree: occupational, Scientist, product/process development, or vocational program  Occupational History   Not on file  Tobacco Use   Smoking status: Never    Passive exposure: Never   Smokeless tobacco: Never  Vaping Use   Vaping status: Never Used  Substance and Sexual Activity   Alcohol use: Never   Drug use: Never   Sexual activity: Never  Other Topics Concern   Not on file  Social History Narrative   04/27/22 lives with wife   Social Drivers of Corporate investment banker Strain: Not on file  Food Insecurity: No Food Insecurity (12/10/2022)   Hunger Vital Sign    Worried About Running Out of Food in the Last Year: Never true    Ran Out of Food in the Last Year: Never true  Transportation Needs: No Transportation Needs (12/10/2022)   PRAPARE - Administrator, Civil Service (Medical): No    Lack of Transportation (Non-Medical): No  Physical Activity: Not on file  Stress: Not on file  Social Connections: Not on file  Intimate Partner Violence: Not At Risk (12/10/2022)   Humiliation, Afraid, Rape, and Kick questionnaire    Fear of Current or Ex-Partner: No    Emotionally Abused: No    Physically Abused: No    Sexually Abused: No     PHYSICAL EXAM  GENERAL EXAM/CONSTITUTIONAL: Vitals:  There were no vitals filed for this visit.   There is no height or weight on file to calculate BMI. Wt Readings from Last 3 Encounters:  01/16/24 197 lb 6.4 oz (89.5 kg)  11/08/23 191 lb 6.4 oz (86.8 kg)  11/08/23 191 lb (86.6 kg)   Patient is in no distress; well developed, nourished and groomed; neck is  supple  CARDIOVASCULAR: Examination of carotid arteries is normal; no carotid bruits Regular rate and rhythm, no murmurs Examination of peripheral vascular system by observation and palpation is normal  EYES: Ophthalmoscopic exam of optic discs and posterior segments is normal; no papilledema or hemorrhages No results found.  MUSCULOSKELETAL: Gait, strength, tone, movements noted in Neurologic exam below  NEUROLOGIC: MENTAL STATUS:      No data to display         awake, alert, oriented to person SEVERE EXPRESSIVE APHASIA ABLE TO COMPREHEND ABLE TO NAME PEN, WATCH, GLOVE  CRANIAL NERVE:  2nd, 3rd, 4th, 6th - pupils equal and reactive to light, visual fields full to confrontation, extraocular muscles intact, no nystagmus 5th - facial sensation symmetric 7th - facial strength symmetric 8th - hearing intact 9th - palate elevates symmetrically, uvula midline 11th - shoulder shrug symmetric 12th - tongue protrusion midline  MOTOR:  normal bulk and tone, full strength in the  LUE, LLE DENSE RIGHT HEMIPLEGIA --> RUE 1/5, RLE 1/5  SENSORY:  normal and symmetric to light touch, temperature, vibration  COORDINATION:  finger-nose-finger, fine finger movements NORMAL ON LEFT; RUE 0/5  REFLEXES:  deep tendon reflexes TRACE and symmetric  GAIT/STATION:  IN WHEELCHAIR    DIAGNOSTIC DATA (LABS, IMAGING, TESTING) - I reviewed patient records, labs, notes, testing and imaging myself where available.  Lab Results  Component Value Date   WBC 11.3 (H) 10/31/2023   HGB 15.0 10/31/2023   HCT 43.9 10/31/2023   MCV 93.0 10/31/2023   PLT 139 (L) 10/31/2023      Component Value Date/Time   NA 135 10/31/2023 1621   K 3.6 10/31/2023 1621   CL 103 10/31/2023 1621   CO2 22 10/31/2023 1621   GLUCOSE 137 (H) 10/31/2023 1621   BUN 16 10/31/2023 1621   CREATININE 1.06 10/31/2023 1621   CALCIUM  9.4 10/31/2023 1621   PROT 7.1 10/31/2023 1621   ALBUMIN 4.0 10/31/2023 1621   AST  33 10/31/2023 1621   ALT 46 (H) 10/31/2023 1621   ALKPHOS 72 10/31/2023 1621   BILITOT 0.6 10/31/2023 1621   GFRNONAA >60 10/31/2023 1621   Lab Results  Component Value Date   CHOL 198 02/12/2022   HDL 47 02/12/2022   LDLCALC 122 (H) 02/12/2022   TRIG 146 02/12/2022   CHOLHDL 4.2 02/12/2022   Lab Results  Component Value Date   HGBA1C 6.0 (H) 02/11/2022   Lab Results  Component Value Date   VITAMINB12 238 02/14/2022   Lab Results  Component Value Date   TSH 3.728 02/14/2022   Lipid Panel     Component Value Date/Time   CHOL 198 02/12/2022 0421   TRIG 146 02/12/2022 0421   HDL 47 02/12/2022 0421   CHOLHDL 4.2 02/12/2022 0421   VLDL 29 02/12/2022 0421   LDLCALC 122 (H) 02/12/2022 0421     10/31/23 CT head  1. Generalized cerebral atrophy with chronic left MCA territory encephalomalacia. 2. No acute intracranial abnormality. 3. Moderate severity sphenoid sinus and mild to moderate severity bilateral ethmoid sinus disease.    ASSESSMENT AND PLAN  63 y.o. year old male here with:   Dx:  1. Seizure, late effect of stroke [facility])       PLAN:  POST STROKE SEIZURE (Oct 2024, Dec 2024) - continue levetiracetam  500mg  twice a day (current and future refills per [facility])  Stroke (April 2023) --> left MCA infarct due to left M1 occlusion s/p TNK and thrombectomy x2 with stent placement for left M2 dissection followed by stent reocclusion, etiology unclear code Stroke CT head Hyperdense left MCA, no ICH ASPECTS 9   CTA head & neck left MCA M1 occlusion MRI  small areas of acute infarct in left MCA territory involving left caudate putamen, insula and left frontal and parietal lobe. Mild HT in left parietal lobe S/p IR x 2 with stent placement for left M2 dissection and then stent reocclusion Repeat CT head 4/14 evolving left MCA distribution infarct, small SAH, stenting of proximal left MCA branches Repeat MRI extensive evolving acute ischemic left MCA  territory infarct, small volume SAH at posterior aspect of Sylvian fissure, few small volume acute ischemic cortical and subcortical infarcts in right frontal lobe and ACA distribution 2D Echo EF 60-65%, interatrial septum not well visualized TEE 4/17- negative bubble study, no cardiac thrombus hypercoagulable work up neg except slightly elevated homocysteine  LDL 122 HgbA1c 6.0 No antithrombotic prior to admission,  then on aspirin  81 mg daily and Brilinta  (ticagrelor ) 90 mg bid. then aspirin  stopped, eliquis  added for DVT; brillinta continued and now completed   Hypertension Home meds:  none Long-term BP goal normotensive   Hyperlipidemia Home meds:  none LDL 122, goal < 70 continue atorvastatin  40 mg     Other Stroke Risk Factors Obesity, Body mass index is 37.75 kg/m., BMI >/= 30 associated with increased stroke risk, recommend weight loss, diet and exercise as appropriate  Consider sleep study; patient and wife would hold off for now  Return for return to PCP, pending if symptoms worsen or fail to improve.  Virtual Visit via Video Note  I connected with Gordon Fisher on 08/13/2024 at  1:30 PM EDT by a video enabled telemedicine application and verified that I am speaking with the correct person using two identifiers.   I discussed the limitations of evaluation and management by telemedicine and the availability of in person appointments. The patient expressed understanding and agreed to proceed.  Patient is at home and I am at the office.   I spent 15 minutes of face-to-face and non-face-to-face time with patient.  This included previsit chart review, lab review, study review, order entry, electronic health record documentation, patient education.      EDUARD FABIENE HANLON, MD 08/13/2024, 1:34 PM Certified in Neurology, Neurophysiology and Neuroimaging  [facility] [address] [place], [place] [address] [phone]

## 2024-09-03 ENCOUNTER — Ambulatory Visit: Payer: 59

## 2024-09-03 DIAGNOSIS — I493 Ventricular premature depolarization: Secondary | ICD-10-CM

## 2024-09-03 LAB — CUP PACEART REMOTE DEVICE CHECK
Date Time Interrogation Session: 20251103062349
Implantable Pulse Generator Implant Date: 20230807
Pulse Gen Model: 5000
Pulse Gen Serial Number: 511010623

## 2024-09-05 NOTE — Progress Notes (Signed)
 Remote Loop Recorder Transmission

## 2024-09-06 ENCOUNTER — Ambulatory Visit: Payer: Self-pay | Admitting: Cardiovascular Disease

## 2024-10-04 ENCOUNTER — Ambulatory Visit: Payer: 59

## 2024-10-06 LAB — CUP PACEART REMOTE DEVICE CHECK
Date Time Interrogation Session: 20251204110941
Implantable Pulse Generator Implant Date: 20230807
Pulse Gen Model: 5000
Pulse Gen Serial Number: 511010623

## 2024-10-10 NOTE — Progress Notes (Signed)
 Remote Loop Recorder Transmission

## 2024-10-18 ENCOUNTER — Ambulatory Visit: Payer: Self-pay | Admitting: Cardiovascular Disease

## 2024-11-05 ENCOUNTER — Ambulatory Visit (INDEPENDENT_AMBULATORY_CARE_PROVIDER_SITE_OTHER): Payer: 59

## 2024-11-05 DIAGNOSIS — I493 Ventricular premature depolarization: Secondary | ICD-10-CM

## 2024-11-05 LAB — CUP PACEART REMOTE DEVICE CHECK
Date Time Interrogation Session: 20260104083532
Implantable Pulse Generator Implant Date: 20230807
Pulse Gen Model: 5000
Pulse Gen Serial Number: 511010623

## 2024-11-08 NOTE — Progress Notes (Signed)
 Remote Loop Recorder Transmission

## 2024-11-10 ENCOUNTER — Ambulatory Visit: Payer: Self-pay | Admitting: Cardiovascular Disease

## 2024-12-06 LAB — CUP PACEART REMOTE DEVICE CHECK
Date Time Interrogation Session: 20260204091501
Implantable Pulse Generator Implant Date: 20230807
Pulse Gen Model: 5000
Pulse Gen Serial Number: 511010623

## 2025-01-14 ENCOUNTER — Ambulatory Visit: Admitting: Physical Medicine & Rehabilitation
